# Patient Record
Sex: Female | Born: 1935
Health system: Southern US, Community
[De-identification: ages and names within clinical notes are randomized; demographics above are authoritative.]

## PROBLEM LIST (undated history)

## (undated) DIAGNOSIS — J45909 Unspecified asthma, uncomplicated: Secondary | ICD-10-CM

## (undated) DIAGNOSIS — T7840XA Allergy, unspecified, initial encounter: Secondary | ICD-10-CM

## (undated) DIAGNOSIS — E785 Hyperlipidemia, unspecified: Secondary | ICD-10-CM

## (undated) DIAGNOSIS — M810 Age-related osteoporosis without current pathological fracture: Secondary | ICD-10-CM

## (undated) HISTORY — DX: Allergy, unspecified, initial encounter: T78.40XA

## (undated) HISTORY — DX: Age-related osteoporosis without current pathological fracture: M81.0

## (undated) HISTORY — DX: Unspecified asthma, uncomplicated: J45.909

## (undated) HISTORY — PX: HAND TENDON SURGERY: SHX663

## (undated) HISTORY — DX: Hyperlipidemia, unspecified: E78.5

## (undated) HISTORY — PX: EYE SURGERY: SHX253

## (undated) HISTORY — PX: BREAST BIOPSY: SHX20

## (undated) HISTORY — PX: OTHER SURGICAL HISTORY: SHX169

---

## 1959-12-17 HISTORY — PX: TONSILLECTOMY: SHX5217

## 2000-03-18 ENCOUNTER — Encounter: Payer: Self-pay | Admitting: Emergency Medicine

## 2000-03-18 ENCOUNTER — Emergency Department (HOSPITAL_COMMUNITY): Admission: EM | Admit: 2000-03-18 | Discharge: 2000-03-18 | Payer: Self-pay | Admitting: Emergency Medicine

## 2000-04-04 ENCOUNTER — Ambulatory Visit (HOSPITAL_COMMUNITY): Admission: RE | Admit: 2000-04-04 | Discharge: 2000-04-04 | Payer: Self-pay | Admitting: Gastroenterology

## 2000-08-08 ENCOUNTER — Encounter (INDEPENDENT_AMBULATORY_CARE_PROVIDER_SITE_OTHER): Payer: Self-pay | Admitting: Specialist

## 2000-08-08 ENCOUNTER — Ambulatory Visit (HOSPITAL_COMMUNITY): Admission: RE | Admit: 2000-08-08 | Discharge: 2000-08-08 | Payer: Self-pay | Admitting: Urology

## 2001-12-16 HISTORY — PX: ABDOMINAL HYSTERECTOMY: SHX81

## 2006-06-02 ENCOUNTER — Ambulatory Visit: Payer: Self-pay | Admitting: Internal Medicine

## 2006-06-19 ENCOUNTER — Ambulatory Visit: Payer: Self-pay | Admitting: Specialist

## 2006-07-17 ENCOUNTER — Ambulatory Visit: Payer: Self-pay | Admitting: Specialist

## 2006-07-29 ENCOUNTER — Ambulatory Visit: Payer: Self-pay | Admitting: Specialist

## 2006-09-24 ENCOUNTER — Ambulatory Visit: Payer: Self-pay | Admitting: Specialist

## 2008-02-16 ENCOUNTER — Encounter: Admission: RE | Admit: 2008-02-16 | Discharge: 2008-02-16 | Payer: Self-pay | Admitting: Sports Medicine

## 2011-01-06 ENCOUNTER — Encounter: Payer: Self-pay | Admitting: Sports Medicine

## 2011-05-03 NOTE — Op Note (Signed)
[facility]  Patient:    Jackie Turner, Jackie Turner                       MRN: [id number] Proc. Date: 08/08/00 Adm. Date:  [id number] Attending:  Londell Moh CC:         Griffith Citron, M.D.  Lorie Phenix, M.D., [place], [place].   Operative Report  SERVICE:  Urology.  PREOPERATIVE DIAGNOSES:  Interstitial cystitis.  POSTOPERATIVE DIAGNOSES:  Interstitial cystitis.  PROCEDURE:  Cystoscopy, urethral calibration, hydrodistention of the bladder, Marcaine and Pyridium installation, Marcaine and Kenalog injection, bladder biopsy.  SURGEON:  Dr. Logan Bores.  ANESTHESIA:  General.  COMPLICATIONS:  None.  DRAINS:  None.  BRIEF HISTORY:  This 75 year old female has had a long standing problem with urinary urgency and frequency voiding as often as every 10-15 minutes. This problem started earlier this year. The patient has all the signs and symptoms of interstitial cystitis. She has had a trial of anticholinergic therapy without any real improvement and is now to undergo cystoscopy and hydrodistention as an attempt to determine the source of her problems. The patient understands the risks and benefits of the procedure and gave full and informed consent.  DESCRIPTION OF PROCEDURE:  After successful induction of general anesthesia, the patient was placed in the dorsal lithotomy position, prepped with Betadine and draped in the usual sterile fashion. The patient has previously been told that she has a cystocele; however, there is only a very modest cystocele that did not appear to be the source of the patients severe frequency. She is not known to have overflow incontinence, urgency incontinence or retention. Bimanual examination revealed no real abnormalities. There were no palpable masses, no significant rectocele or enterocele. There was no mass on the urethra. The urethra was calibrated at 50 [place] with female urethral sounds. The bladder was then  inspected and was free of any tumor or stones. Both ureteral orifices were normal in configuration and location. When the bladder was extended, the bladder had a bladder capacity of only 400 cc. She presented for 5 minutes at 100 cm of pressure. When the drain out cycle was done, the capacity as noted above was diminished to 400 cc. There was a terminal blood tinge and glomerulations were seen throughout the bladder. A bladder biopsy was taken and sent for a mast cell analysis. The biopsy site was cauterized. A mixture of Marcaine and Pyridium was left in the bladder. Marcaine and Kenalog were injected periurethrally. The patient tolerated the procedure well and was taken to the recovery room in good condition. She will be given a prescription for Lorcet plus to take as needed for pain, uromax for any symptom management and Macrobid for several days. She will return to my office in 2-3 weeks time for follow-up. We will determine if she has had any improvement in her symptoms and if not will initiate either intravesical therapy or oral therapy depending on the severity of her postoperative symptoms. DD:  08/08/00 TD:  08/10/00 Job: [id number]

## 2012-01-08 DIAGNOSIS — R350 Frequency of micturition: Secondary | ICD-10-CM | POA: Insufficient documentation

## 2012-01-08 DIAGNOSIS — R3915 Urgency of urination: Secondary | ICD-10-CM | POA: Insufficient documentation

## 2013-04-26 ENCOUNTER — Ambulatory Visit: Payer: Self-pay | Admitting: Ophthalmology

## 2013-04-27 DIAGNOSIS — M5416 Radiculopathy, lumbar region: Secondary | ICD-10-CM | POA: Insufficient documentation

## 2013-05-03 ENCOUNTER — Ambulatory Visit: Payer: Self-pay | Admitting: Ophthalmology

## 2013-05-17 ENCOUNTER — Ambulatory Visit: Payer: Self-pay | Admitting: Ophthalmology

## 2013-08-12 ENCOUNTER — Encounter: Payer: Self-pay | Admitting: Orthopedic Surgery

## 2013-08-16 ENCOUNTER — Encounter: Payer: Self-pay | Admitting: Orthopedic Surgery

## 2013-09-15 ENCOUNTER — Encounter: Payer: Self-pay | Admitting: Orthopedic Surgery

## 2014-01-27 LAB — CBC AND DIFFERENTIAL
HCT: 41 % (ref 36–46)
HEMOGLOBIN: 14.3 g/dL (ref 12.0–16.0)
PLATELETS: 338 10*3/uL (ref 150–399)
WBC: 5 10*3/mL

## 2014-01-27 LAB — BASIC METABOLIC PANEL
BUN: 15 mg/dL (ref 4–21)
Creatinine: 0.7 mg/dL (ref 0.5–1.1)
Glucose: 93 mg/dL
Potassium: 4.8 mmol/L (ref 3.4–5.3)
Sodium: 144 mmol/L (ref 137–147)

## 2014-01-27 LAB — LIPID PANEL
CHOLESTEROL: 213 mg/dL — AB (ref 0–200)
HDL: 71 mg/dL — AB (ref 35–70)
LDL CALC: 119 mg/dL
TRIGLYCERIDES: 115 mg/dL (ref 40–160)

## 2014-01-27 LAB — HEPATIC FUNCTION PANEL
ALT: 17 U/L (ref 7–35)
AST: 17 U/L (ref 13–35)

## 2014-01-27 LAB — TSH: TSH: 4 u[IU]/mL (ref 0.41–5.90)

## 2014-02-16 ENCOUNTER — Ambulatory Visit: Payer: Self-pay | Admitting: Family Medicine

## 2014-07-18 ENCOUNTER — Ambulatory Visit: Payer: Self-pay

## 2015-03-31 ENCOUNTER — Other Ambulatory Visit: Payer: Self-pay | Admitting: Family Medicine

## 2015-03-31 DIAGNOSIS — R69 Illness, unspecified: Secondary | ICD-10-CM

## 2015-03-31 DIAGNOSIS — Z1231 Encounter for screening mammogram for malignant neoplasm of breast: Secondary | ICD-10-CM

## 2015-04-07 NOTE — Op Note (Signed)
PATIENT NAME:  Jackie Turner, Jackie Turner MR#:  [id number] DATE OF BIRTH:  [birth year]  DATE OF SURGERY: 05/17/2013.   PREOPERATIVE DIAGNOSIS: Cataract, right eye.   POSTOPERATIVE DIAGNOSIS: Cataract, right eye.   PROCEDURE PERFORMED: Extracapsular cataract extraction using phacoemulsification with placement of an Alcon [id number] 11.5-diopter posterior chamber lens with 2.25 diopters of cylinder, serial #[id number].   SURGEON: Loura Back. [place]aundra Takacs, M.D.   ANESTHESIA: 4% lidocaine and 0.75% Marcaine in 50:50 mixture with 10 units/mL of Hylenex added given as a peribulbar.   ANESTHESIOLOGIST: Dr. Carolin Sicks.   COMPLICATIONS: None.   ESTIMATED BLOOD LOSS: Less than 1 mL.   DESCRIPTION OF PROCEDURE: The patient was brought to the operating room and had topical proparacaine placed in each eye. With the patient fixing at a distant target, the 3, 6, and  9 positions were marked using a marking pen. The horizontal positions were confirmed using an  [place] toric marker. The patient was then placed supine and given IV sedation and a peribulbar block. She was then prepped and draped in the usual fashion. The vertical rectus muscles were imbricated using 5-0 silk sutures as a bridle suture. The toric marker was brought to the table and centered over the 3 marks, and the 19-degree position was marked; also the 110-degree position was marked superiorly. A limbal peritomy was carried out for 1 clock-hour at 110 degrees. Hemostasis was obtained with cautery. A partial-thickness scleral groove was made at the posterior surgical limbus and dissected anteriorly into clear cornea with an Alcon crescent knife. The anterior chamber was entered superonasally through clear cornea and through the lamellar section with a 2.6 mm keratome. DisCoVisc was used to take the aqueous, and a continuous-tear circular capsulorrhexis was carried out. Hydrodelineation was used to loosen the nucleus. Phacoemulsification was carried out in a divide-and  conquer technique. Ultrasound time was  1 minute and 17 seconds, with an average power of 16.9%. CDE 23.06.   Irrigation-aspiration was used to remove the residual cortex. The graft capsular bag was inflated with DisCoVisc, and the intraocular lens was inserted in the capsular bag using a [employer]. It was being lined up with the marks. It was noted that there was a small laceration at the base of one of the haptics. This was inspected using a [place] hook and found to be not full-thickness throughout the extend laceration. It extended approximately halfway into the base. The haptic unfolded normally and the lens centered appropriately, so a decision was made that it was safe to leave the lens in position with this partial-thickness cut into the base of the haptic.   Irrigation-aspiration was used to remove the residual DisCoVisc. The position of the lens was reconfirmed to be lined up with the 19-degree marks. Balanced salt was used to inflate the wound, and Miostat was injected in the paracentesis tract. A tenth of a mL of cefuroxime was injected through the paracentesis tract as well. The wound was checked for leaks, and none were found. Conjunctiva was then closed with cautery. The bridle sutures were removed and 2 drops of Vigamox were placed in the eye. A shield was placed on the eye and the patient was discharged to the recovery room in good condition.    ____________________________ Loura Back Shaliah Wann, MD sad:dm D: 05/17/2013 13:49:12 ET T: 05/17/2013 15:41:42 ET JOB#: [id number]  cc: Remo Lipps A. Nahal Wanless, MD, <Dictator> Martie Lee MD ELECTRONICALLY SIGNED 05/24/2013 13:58

## 2015-04-07 NOTE — Op Note (Signed)
PATIENT NAME:  Jackie Turner, Jackie Turner MR#:  [id number] DATE OF BIRTH:  [birth year]  DATE OF PROCEDURE:  05/03/2013  PREOPERATIVE DIAGNOSIS: Cataract, left eye.   POSTOPERATIVE DIAGNOSIS: Cataract, left eye.   PROCEDURE PERFORMED: Extracapsular cataract extraction using phacoemulsification with placement of Alcon  [id number], 11.5 diopter posterior chamber lens, serial #[id number].   SURGEON: Loura Back. Rosemary Mossbarger, MD   ASSISTANT: None.  ANESTHESIA: 4% lidocaine and 0.75% Marcaine in a 50/50 mixture with 10 units/mL of Hylenex added, given as a peribulbar.   ANESTHESIOLOGIST: Amy M. Rice, MD  COMPLICATIONS: None.   ESTIMATED BLOOD LOSS: Less than 1 mL.   DESCRIPTION OF PROCEDURE:  The patient was brought to the operating room and given a peribulbar block.  The patient was then prepped and draped in the usual fashion.  The vertical rectus muscles were imbricated using 5-0 silk sutures.  These sutures were then clamped to the sterile drapes as bridle sutures.  A limbal peritomy was performed extending two clock hours and hemostasis was obtained with cautery.  A partial thickness scleral groove was made at the surgical limbus and dissected anteriorly in a lamellar dissection using an Alcon crescent knife.  The anterior chamber was entered supero-temporally with a Superblade and through the lamellar dissection with a 2.6 mm keratome.  DisCoVisc was used to replace the aqueous and a continuous tear capsulorrhexis was carried out.  Hydrodissection and hydrodelineation were carried out with balanced salt and a 27 gauge canula.  The nucleus was rotated to confirm the effectiveness of the hydrodissection.  Phacoemulsification was carried out using a divide-and-conquer technique.  Total ultrasound time was 1 minute and 21 seconds with an average power of  24.7 percent. CDE 35.88.  Irrigation/aspiration was used to remove the residual cortex.  DisCoVisc was used to inflate the capsule and the internal incision  was enlarged to 3 mm with the crescent knife.  The intraocular lens was folded and inserted into the capsular bag using the AcrySert delivery system.  Irrigation/aspiration was used to remove the residual DisCoVisc.  Miostat was injected into the anterior chamber through the paracentesis track to inflate the anterior chamber and induce miosis. Cefuroxime 0.1 mL was also injected through the paracentesis track. The wound was checked for leaks and none were found. The conjunctiva was closed with cautery and the bridle sutures were removed.  Two drops of 0.3% Vigamox were placed on the eye.   An eye shield was placed on the eye.  The patient was discharged to the recovery room in good condition.  ____________________________ Loura Back Jackie Winkowski, MD sad:jm D: 05/03/2013 14:11:36 ET T: 05/03/2013 16:44:32 ET JOB#: [id number]  cc: Remo Lipps A. Copelan Maultsby, MD, <Dictator> Martie Lee MD ELECTRONICALLY SIGNED 05/17/2013 13:56

## 2015-05-03 LAB — HM MAMMOGRAPHY

## 2015-05-09 ENCOUNTER — Ambulatory Visit
Admission: RE | Admit: 2015-05-09 | Discharge: 2015-05-09 | Disposition: A | Discharge status: Home / Self Care | Payer: Medicare Other | Source: Ambulatory Visit | Attending: Family Medicine | Admitting: Family Medicine

## 2015-05-09 DIAGNOSIS — M810 Age-related osteoporosis without current pathological fracture: Secondary | ICD-10-CM | POA: Insufficient documentation

## 2015-05-09 DIAGNOSIS — R69 Illness, unspecified: Secondary | ICD-10-CM

## 2015-05-09 LAB — HM DEXA SCAN

## 2015-06-26 DIAGNOSIS — E785 Hyperlipidemia, unspecified: Secondary | ICD-10-CM | POA: Insufficient documentation

## 2015-06-26 DIAGNOSIS — G43909 Migraine, unspecified, not intractable, without status migrainosus: Secondary | ICD-10-CM | POA: Insufficient documentation

## 2015-06-26 DIAGNOSIS — R5383 Other fatigue: Secondary | ICD-10-CM | POA: Insufficient documentation

## 2015-06-26 DIAGNOSIS — G43009 Migraine without aura, not intractable, without status migrainosus: Secondary | ICD-10-CM

## 2015-06-26 DIAGNOSIS — M810 Age-related osteoporosis without current pathological fracture: Secondary | ICD-10-CM | POA: Insufficient documentation

## 2015-06-26 DIAGNOSIS — J45909 Unspecified asthma, uncomplicated: Secondary | ICD-10-CM | POA: Insufficient documentation

## 2015-06-26 DIAGNOSIS — K589 Irritable bowel syndrome without diarrhea: Secondary | ICD-10-CM | POA: Insufficient documentation

## 2015-06-26 DIAGNOSIS — E7800 Pure hypercholesterolemia, unspecified: Secondary | ICD-10-CM

## 2015-06-27 ENCOUNTER — Encounter: Payer: Self-pay | Admitting: Family Medicine

## 2015-06-27 ENCOUNTER — Ambulatory Visit
Admission: RE | Admit: 2015-06-27 | Discharge: 2015-06-27 | Disposition: A | Discharge status: Home / Self Care | Payer: Medicare Other | Source: Ambulatory Visit | Attending: Family Medicine | Admitting: Family Medicine

## 2015-06-27 ENCOUNTER — Telehealth: Payer: Self-pay

## 2015-06-27 ENCOUNTER — Ambulatory Visit (INDEPENDENT_AMBULATORY_CARE_PROVIDER_SITE_OTHER): Payer: Medicare Other | Admitting: Family Medicine

## 2015-06-27 VITALS — BP 122/70 | HR 72 | Temp 99.6°F | Resp 16 | Wt 108.0 lb

## 2015-06-27 DIAGNOSIS — J984 Other disorders of lung: Secondary | ICD-10-CM | POA: Insufficient documentation

## 2015-06-27 DIAGNOSIS — R05 Cough: Secondary | ICD-10-CM

## 2015-06-27 DIAGNOSIS — J90 Pleural effusion, not elsewhere classified: Secondary | ICD-10-CM | POA: Diagnosis not present

## 2015-06-27 DIAGNOSIS — R509 Fever, unspecified: Secondary | ICD-10-CM | POA: Diagnosis not present

## 2015-06-27 DIAGNOSIS — R059 Cough, unspecified: Secondary | ICD-10-CM

## 2015-06-27 MED ORDER — PREDNISONE 10 MG PO TABS: ORAL_TABLET | ORAL | Status: DC

## 2015-06-27 MED ORDER — LEVOFLOXACIN 500 MG PO TABS: 500.0000 mg | ORAL_TABLET | Freq: Every day | ORAL | Status: DC

## 2015-06-27 NOTE — Telephone Encounter (Signed)
Pt advised as directed below.  I also advised her she would need a repeat x-ray in about four weeks to make sure the pneumonia clears.    Thanks,   -Mickel Baas

## 2015-06-27 NOTE — Progress Notes (Signed)
Subjective:     Patient ID: Jackie Turner, female   DOB: [birth year], 79 y.o.   MRN: [id number]  URI  This is a new problem. The current episode started in the past 7 days. The problem has been unchanged. The maximum temperature recorded prior to her arrival was 100.4 - 100.9 F. The fever has been present for 3 to 4 days. Associated symptoms include congestion, coughing, headaches, a plugged ear sensation and wheezing. Pertinent negatives include no abdominal pain, chest pain, diarrhea, ear pain, nausea, rhinorrhea, sinus pain, sneezing, sore throat or vomiting. She has tried decongestant and antihistamine for the symptoms. The treatment provided no relief.  Cough This is a new problem. The current episode started in the past 7 days. The problem has been unchanged. The problem occurs constantly. The cough is productive of sputum. Associated symptoms include chills, a fever, headaches and wheezing. Pertinent negatives include no chest pain, ear pain, heartburn, hemoptysis, myalgias, postnasal drip, rhinorrhea, sore throat, shortness of breath or weight loss. The treatment provided no relief. There is no history of asthma.  Fever  This is a new problem. The current episode started in the past 7 days. The problem occurs daily. The problem has been unchanged. The maximum temperature noted was 100 to 100.9 F. Associated symptoms include congestion, coughing, headaches and wheezing. Pertinent negatives include no abdominal pain, chest pain, diarrhea, ear pain, nausea, sore throat or vomiting.   Was recently with her husband at [facility]. He is not sick currently.    Review of Systems  Constitutional: Positive for fever, chills, diaphoresis, appetite change and fatigue. Negative for weight loss, activity change and unexpected weight change.  HENT: Positive for congestion. Negative for ear discharge, ear pain, facial swelling, hearing loss, mouth sores, nosebleeds, postnasal drip, rhinorrhea, sinus pressure,  sneezing, sore throat, tinnitus, trouble swallowing and voice change.   Respiratory: Positive for cough and wheezing. Negative for apnea, hemoptysis, choking, shortness of breath and stridor.   Cardiovascular: Negative for chest pain.  Gastrointestinal: Positive for constipation. Negative for heartburn, nausea, vomiting, abdominal pain, diarrhea, blood in stool, abdominal distention, anal bleeding and rectal pain.  Musculoskeletal: Negative for myalgias, back pain and arthralgias.  Neurological: Positive for light-headedness and headaches. Negative for dizziness, tremors, seizures, syncope, speech difficulty, weakness and numbness.   Patient Active Problem List   Diagnosis Date Noted  . Hypercholesteremia 06/26/2015  . Migraine 06/26/2015  . Asthma 06/26/2015  . IBS (irritable bowel syndrome) 06/26/2015  . Osteoporosis 06/26/2015  . Fatigue 06/26/2015  . Lumbar radiculopathy 04/27/2013   Past Medical History  Diagnosis Date  . Hyperlipidemia   . Allergy   . Asthma   . Osteoporosis    Current Outpatient Prescriptions on File Prior to Visit  Medication Sig  . Calcium Carbonate-Vitamin D 600-400 MG-UNIT per tablet Take by mouth.  . doxepin (SINEQUAN) 50 MG capsule Take 1 capsule by mouth daily.  . fluticasone (FLONASE) 50 MCG/ACT nasal spray   . Fluticasone-Salmeterol (ADVAIR DISKUS) 100-50 MCG/DOSE AEPB   . loratadine (CLARITIN) 10 MG tablet Take by mouth.  . oxybutynin (DITROPAN XL) 15 MG 24 hr tablet   . pentosan polysulfate (ELMIRON) 100 MG capsule   . traMADol (ULTRAM) 50 MG tablet Take 1-2 tablets by mouth as needed.  . verapamil (CALAN-SR) 180 MG CR tablet    No current facility-administered medications on file prior to visit.   Allergies  Allergen Reactions  . Nsaids Other (See Comments)    History of gastric ulcer  .  Amoxicillin Nausea Only  . Ibandronic Acid Other (See Comments)    Muscle weakness - advised not to take  . Butalbital-Aspirin-Caffeine     Other  reaction(s): Unknown  . Clarithromycin Other (See Comments)    Bloating  . Tylenol With Codeine #3  [Acetaminophen-Codeine]     Other reaction(s): Unknown  . Raloxifene Other (See Comments)    bloating   Past Surgical History  Procedure Laterality Date  . Abdominal hysterectomy  2003  . Tonsillectomy  1961  . Eye surgery Bilateral     cataract extraction  . Breast biopsy  1976, 1978    benign  . Carpal tunnel repair Bilateral     R 1990; L 1997   History   Social History  . Marital Status: Married    Spouse Name: [name]  . Number of Children: 0  . Years of Education: H/S   Occupational History  . Retired    Social History Main Topics  . Smoking status: Never Smoker   . Smokeless tobacco: Never Used  . Alcohol Use: No  . Drug Use: No  . Sexual Activity: Not on file   Other Topics Concern  . Not on file   Social History Narrative   Family History  Problem Relation Age of Onset  . Heart disease Mother   . CAD Mother   . Congestive Heart Failure Mother   . Osteoarthritis Mother   . Cancer Sister     breast       Objective:   Physical Exam  Constitutional: She is oriented to person, place, and time. She appears well-developed and well-nourished.  HENT:  Head: Normocephalic and atraumatic.  Right Ear: External ear normal.  Left Ear: External ear normal.  Nose: Nose normal.  Mouth/Throat: Oropharynx is clear and moist.  Neck: Normal range of motion. Neck supple.  Cardiovascular: Normal rate and regular rhythm.   Pulmonary/Chest: Effort normal. She has wheezes (especially in left lung fields.  Not much improvement with nebs.  ).  Neurological: She is alert and oriented to person, place, and time.  Skin: Skin is warm.  Psychiatric: She has a normal mood and affect. Her behavior is normal. Judgment and thought content normal.   BP 122/70 mmHg  Pulse 72  Temp(Src) 99.6 F (37.6 C) (Oral)  Resp 16  Wt 108 lb (48.988 kg)  SpO2 90%     Assessment:      Cough - Plan: DG Chest 2 View  Fever, unspecified fever cause - Plan: DG Chest 2 View      Plan:       Patient with signs and symptoms of bronchitis versus pneumonia.  Does have asthma and has been using her inhalers. Will add Prednisone 12 day taper and Levaquin 500 mg for 7 days. Please call back if condition worsens or does not continue to improve.   Will go to ER if any respiratory distress.    Margarita Rana, MD

## 2015-06-27 NOTE — Telephone Encounter (Signed)
-----   Message from Margarita Rana, MD sent at 06/27/2015  3:03 PM EDT ----- May have  Pneumonia as suspected. Proceed with medication as started today. Thanks.

## 2015-06-29 ENCOUNTER — Telehealth: Payer: Self-pay | Admitting: Family Medicine

## 2015-06-29 NOTE — Telephone Encounter (Signed)
Pt states her mouth is red and raw since she started the Rx predniSONE (DELTASONE) 10 MG tablet and levofloxacin (LEVAQUIN) 500 MG.  Pt is requesting a Rx for 120 Nystatin 100000 u/ml sus ml liquid or pill to help with this.  Pt states she got this from the pharmacist.  [place].  CB#[phone]

## 2015-06-30 MED ORDER — NYSTATIN 100000 UNIT/ML MT SUSP
5.0000 mL | Freq: Four times a day (QID) | OROMUCOSAL | Status: DC
Start: 2015-06-30 — End: 2015-09-27

## 2015-06-30 NOTE — Telephone Encounter (Signed)
Informed pt as below. Jackie Turner, CMA  

## 2015-06-30 NOTE — Telephone Encounter (Signed)
Sent rx. Thanks.  

## 2015-07-18 ENCOUNTER — Telehealth: Payer: Self-pay | Admitting: Family Medicine

## 2015-07-18 MED ORDER — DICYCLOMINE HCL 10 MG PO CAPS: 10.0000 mg | ORAL_CAPSULE | Freq: Three times a day (TID) | ORAL | Status: DC

## 2015-07-18 NOTE — Telephone Encounter (Signed)
Pt contacted office for refill request on the following medications:   Bentyl 10mg .  [facility].  CB#[phone]/MW

## 2015-07-18 NOTE — Telephone Encounter (Signed)
Last ov was on 06/27/2015 Last time prescription was refilled was on 12/01/2014

## 2015-08-02 ENCOUNTER — Other Ambulatory Visit: Payer: Self-pay | Admitting: Family Medicine

## 2015-08-02 NOTE — Telephone Encounter (Signed)
This is a pt of Dr. Sharyon Medicus Last ov was on 06/27/2015  Thanks,

## 2015-08-08 ENCOUNTER — Other Ambulatory Visit: Payer: Self-pay | Admitting: Family Medicine

## 2015-08-08 DIAGNOSIS — K589 Irritable bowel syndrome without diarrhea: Secondary | ICD-10-CM

## 2015-08-29 ENCOUNTER — Other Ambulatory Visit: Payer: Self-pay | Admitting: Otolaryngology

## 2015-08-29 ENCOUNTER — Ambulatory Visit
Admission: RE | Admit: 2015-08-29 | Discharge: 2015-08-29 | Disposition: A | Discharge status: Home / Self Care | Payer: Medicare Other | Source: Ambulatory Visit | Attending: Otolaryngology | Admitting: Otolaryngology

## 2015-08-29 ENCOUNTER — Telehealth: Payer: Self-pay

## 2015-08-29 ENCOUNTER — Telehealth: Payer: Self-pay | Admitting: Family Medicine

## 2015-08-29 DIAGNOSIS — J45901 Unspecified asthma with (acute) exacerbation: Secondary | ICD-10-CM | POA: Diagnosis present

## 2015-08-29 DIAGNOSIS — R0602 Shortness of breath: Secondary | ICD-10-CM

## 2015-08-29 DIAGNOSIS — J45909 Unspecified asthma, uncomplicated: Secondary | ICD-10-CM

## 2015-08-29 DIAGNOSIS — R7981 Abnormal blood-gas level: Secondary | ICD-10-CM

## 2015-08-29 NOTE — Telephone Encounter (Signed)
Dr. Kathyrn Sheriff called requesting pt FU with PCP. Pt currently has O2 sats in the 80's. Dr. Ayesha Mohair has treatment plan, and advised pt to report to ED if sx worsen. Dr. Kathyrn Sheriff advised pt of appt time. Renaldo Fiddler, CMA

## 2015-08-29 NOTE — Telephone Encounter (Signed)
Please me this patient.  I was blocked out at 4:30. Can move her to 3:30. Thanks.

## 2015-08-29 NOTE — Telephone Encounter (Signed)
Pt advised; moved to 3:30  Thanks,   -Mickel Baas

## 2015-08-31 ENCOUNTER — Encounter: Payer: Self-pay | Admitting: Family Medicine

## 2015-08-31 ENCOUNTER — Ambulatory Visit: Payer: Self-pay | Admitting: Family Medicine

## 2015-08-31 ENCOUNTER — Ambulatory Visit (INDEPENDENT_AMBULATORY_CARE_PROVIDER_SITE_OTHER): Payer: Medicare Other | Admitting: Family Medicine

## 2015-08-31 DIAGNOSIS — J4530 Mild persistent asthma, uncomplicated: Secondary | ICD-10-CM | POA: Diagnosis not present

## 2015-08-31 DIAGNOSIS — M549 Dorsalgia, unspecified: Secondary | ICD-10-CM | POA: Insufficient documentation

## 2015-08-31 DIAGNOSIS — F4320 Adjustment disorder, unspecified: Secondary | ICD-10-CM | POA: Insufficient documentation

## 2015-08-31 DIAGNOSIS — J45909 Unspecified asthma, uncomplicated: Secondary | ICD-10-CM | POA: Insufficient documentation

## 2015-08-31 DIAGNOSIS — N3010 Interstitial cystitis (chronic) without hematuria: Secondary | ICD-10-CM | POA: Insufficient documentation

## 2015-08-31 DIAGNOSIS — F4321 Adjustment disorder with depressed mood: Secondary | ICD-10-CM | POA: Insufficient documentation

## 2015-08-31 MED ORDER — BUSPIRONE HCL 10 MG PO TABS: 10.0000 mg | ORAL_TABLET | Freq: Two times a day (BID) | ORAL | Status: DC

## 2015-08-31 NOTE — Progress Notes (Signed)
Subjective:    Patient ID: Jackie Turner, female    DOB: Mar 11, 1936, 79 y.o.   MRN: [id number]  Breathing Problem She complains of difficulty breathing and shortness of breath. There is no cough or wheezing (Pt reports Wheezing Tuesday; but not now. ). The problem occurs constantly. The problem has been gradually improving. Associated symptoms include malaise/fatigue. Pertinent negatives include no appetite change, chest pain, dyspnea on exertion, ear congestion, ear pain, fever, headaches, heartburn, myalgias, nasal congestion, orthopnea, PND, postnasal drip, rhinorrhea, sneezing, sore throat, sweats, trouble swallowing or weight loss. Her symptoms are alleviated by steroid inhaler and oral steroids. She reports moderate improvement on treatment. Her past medical history is significant for asthma.   Also having a lot of trouble with grief over her husband's death. Has good days and bad days. Really not doing as much as she would like. Is sleeping ok. Cries a lot.  Thinks her stress is making her breathing worse. Is having a lot of headaches also.  Taking Ultram quite frequently.   Patient Active Problem List   Diagnosis Date Noted  . Asthma with allergic rhinitis 08/31/2015  . Back ache 08/31/2015  . Chronic interstitial cystitis 08/31/2015  . Hypercholesteremia 06/26/2015  . Migraine 06/26/2015  . Asthma 06/26/2015  . IBS (irritable bowel syndrome) 06/26/2015  . Osteoporosis 06/26/2015  . Fatigue 06/26/2015  . Lumbar radiculopathy 04/27/2013   Family History  Problem Relation Age of Onset  . Heart disease Mother   . CAD Mother   . Congestive Heart Failure Mother   . Osteoarthritis Mother   . Cancer Sister     breast  . Breast cancer Sister    Social History   Social History  . Marital Status: Widowed    Spouse Name: [name]  . Number of Children: 0  . Years of Education: H/S   Occupational History  . Retired    Social History Main Topics  . Smoking status: Never  Smoker   . Smokeless tobacco: Never Used  . Alcohol Use: No  . Drug Use: No  . Sexual Activity: Not on file   Other Topics Concern  . Not on file   Social History Narrative   Past Surgical History  Procedure Laterality Date  . Abdominal hysterectomy  2003  . Tonsillectomy  1961  . Eye surgery Bilateral     cataract extraction  . Breast biopsy  1976, 1978    benign  . Carpal tunnel repair Bilateral     R 1990; L 1997  . Hand tendon surgery Right     Trigger finger   Allergies  Allergen Reactions  . Nsaids Other (See Comments)    History of gastric ulcer  . Amoxicillin Nausea Only  . Ibandronic Acid Other (See Comments)    Muscle weakness - advised not to take  . Antihistamines, Chlorpheniramine-Type   . Aspirin     Gastric ulcer  . Actonel  [Risedronate] Other (See Comments)    Gastric ulcers  . Butalbital-Aspirin-Caffeine Other (See Comments)    Other reaction(s): Unknown  . Clarithromycin Other (See Comments)    Bloating Bloating Bloating  . Codeine   . Gatifloxacin Other (See Comments)  . Moxifloxacin   . Penicillins Other (See Comments)  . Risedronate Sodium     Gastric ulcer  . Tylenol With Codeine #3  [Acetaminophen-Codeine]     Other reaction(s): Unknown  . Raloxifene Other (See Comments)    Bloating bloating   Previous Medications   CALCIUM  CARBONATE-VITAMIN D 600-400 MG-UNIT PER TABLET    Take by mouth.   DICYCLOMINE (BENTYL) 10 MG CAPSULE    Take 1 capsule (10 mg total) by mouth 4 (four) times daily -  before meals and at bedtime.   DOXEPIN (SINEQUAN) 10 MG CAPSULE    TAKE ONE (1) CAPSULE EACH DAY   DOXEPIN (SINEQUAN) 50 MG CAPSULE    TAKE ONE (1) CAPSULE EACH DAY   FLUTICASONE (FLONASE) 50 MCG/ACT NASAL SPRAY       FLUTICASONE-SALMETEROL (ADVAIR DISKUS) 100-50 MCG/DOSE AEPB       LEVOFLOXACIN (LEVAQUIN) 500 MG TABLET    Take 1 tablet (500 mg total) by mouth daily.   LORATADINE (CLARITIN) 10 MG TABLET    Take by mouth.   METHYLPREDNISOLONE  (MEDROL DOSEPAK) 4 MG TBPK TABLET       NYSTATIN (MYCOSTATIN) 100000 UNIT/ML SUSPENSION    Take 5 mLs (500,000 Units total) by mouth 4 (four) times daily.   OXYBUTYNIN (DITROPAN XL) 15 MG 24 HR TABLET       PENTOSAN POLYSULFATE (ELMIRON) 100 MG CAPSULE       PREDNISONE (DELTASONE) 10 MG TABLET    6 po for 2 days and then 5 po for 2 days and then 4 po for 2 days and 3 po for 2 days and then 2 po for 2 days and then 1 po for 2 days.   TRAMADOL (ULTRAM) 50 MG TABLET    Take 1-2 tablets by mouth as needed.   VENTOLIN HFA 108 (90 BASE) MCG/ACT INHALER       VERAPAMIL (CALAN-SR) 180 MG CR TABLET       BP 136/70 mmHg  Pulse 68  Temp(Src) 98.5 F (36.9 C) (Oral)  Resp 16  Wt 111 lb (50.349 kg)  SpO2 93%    Review of Systems  Constitutional: Positive for malaise/fatigue and fatigue. Negative for fever, chills, weight loss, diaphoresis, activity change, appetite change and unexpected weight change.  HENT: Negative for congestion, dental problem, drooling, ear discharge, ear pain, facial swelling, hearing loss, mouth sores, nosebleeds, postnasal drip, rhinorrhea, sinus pressure, sneezing, sore throat, tinnitus, trouble swallowing and voice change.   Eyes: Negative for photophobia, pain, discharge, redness, itching and visual disturbance.  Respiratory: Positive for chest tightness and shortness of breath. Negative for apnea, cough, choking, wheezing (Pt reports Wheezing Tuesday; but not now. ) and stridor.   Cardiovascular: Negative for chest pain, dyspnea on exertion, palpitations, leg swelling and PND.  Gastrointestinal: Negative for heartburn, nausea, vomiting, abdominal pain, diarrhea, constipation, blood in stool, abdominal distention, anal bleeding and rectal pain.  Musculoskeletal: Negative for myalgias.  Neurological: Negative for dizziness, light-headedness and headaches.       Objective:   Physical Exam  Constitutional: She is oriented to person, place, and time. She appears  well-developed and well-nourished.  Cardiovascular: Normal rate and regular rhythm.   Pulmonary/Chest: Effort normal. She has wheezes.  Neurological: She is alert and oriented to person, place, and time.  Skin: Skin is warm and dry.  Psychiatric: She has a normal mood and affect. Her behavior is normal. Judgment and thought content normal.  Tearful throughout ov.      BP 136/70 mmHg  Pulse 68  Temp(Src) 98.5 F (36.9 C) (Oral)  Resp 16  Wt 111 lb (50.349 kg)  SpO2 93%     Assessment & Plan:  1. Asthma with allergic rhinitis, mild persistent, uncomplicated Improved. Continue to monitor. Patient instructed to call back if condition worsens or  does not improve.     2. Grief reaction New problem. Condition is worsening. Will start medication for better control.  Patient instructed to call back if condition worsens or does not improve.    - busPIRone (BUSPAR) 10 MG tablet; Take 1 tablet (10 mg total) by mouth 2 (two) times daily.  Dispense: 60 tablet; Refill: 3  Patient was seen and examined by Jerrell Belfast, MD, and note scribed, in part,  by Ashley Royalty, CMA and reviewed by me with patient. I have reviewed the document for accuracy and completeness and I agree with above. Jerrell Belfast, MD   Margarita Rana, MD

## 2015-09-05 ENCOUNTER — Other Ambulatory Visit: Payer: Self-pay | Admitting: Family Medicine

## 2015-09-05 DIAGNOSIS — M549 Dorsalgia, unspecified: Secondary | ICD-10-CM

## 2015-09-05 NOTE — Telephone Encounter (Signed)
Ok to call in rx.  Thanks.  

## 2015-09-14 ENCOUNTER — Encounter: Payer: Self-pay | Admitting: Family Medicine

## 2015-09-14 ENCOUNTER — Ambulatory Visit (INDEPENDENT_AMBULATORY_CARE_PROVIDER_SITE_OTHER): Payer: Medicare Other | Admitting: Family Medicine

## 2015-09-14 VITALS — BP 122/72 | HR 84 | Temp 98.8°F | Resp 16 | Wt 111.0 lb

## 2015-09-14 DIAGNOSIS — J45901 Unspecified asthma with (acute) exacerbation: Secondary | ICD-10-CM

## 2015-09-14 DIAGNOSIS — J018 Other acute sinusitis: Secondary | ICD-10-CM | POA: Diagnosis not present

## 2015-09-14 DIAGNOSIS — J019 Acute sinusitis, unspecified: Secondary | ICD-10-CM | POA: Insufficient documentation

## 2015-09-14 MED ORDER — LEVOFLOXACIN 500 MG PO TABS: 500.0000 mg | ORAL_TABLET | Freq: Every day | ORAL | Status: DC

## 2015-09-14 MED ORDER — PREDNISONE 10 MG PO TABS: ORAL_TABLET | ORAL | Status: DC

## 2015-09-14 NOTE — Progress Notes (Signed)
Subjective:    Patient ID: Jackie Turner, female    DOB: 03-01-1936, 79 y.o.   MRN: [id number]  Depression        The current episode started 1 to 4 weeks ago.   The onset quality is sudden (pt was seen for Grief Reaction at OV 2 weeks ago).   The problem occurs daily.The problem is unchanged (Pt has NOT started Buspar; reports she is waiting until things calm down in case she has a reaction to the medication.).  Associated symptoms include fatigue and headaches.  Associated symptoms include no decreased concentration, no helplessness, no hopelessness, does not have insomnia, not irritable, no restlessness, no decreased interest, no appetite change, no body aches, no myalgias, no indigestion, not sad and no suicidal ideas.     The symptoms are aggravated by family issues.  Compliance with treatment is poor.  Past compliance problems include difficulty with treatment plan.  Also complaining of a headache. Did see ENT for it. Does need inhaler and it does help. Using it several times a day.  Is SOB. Worsening. Does have increased work of breathing also.     Review of Systems  Constitutional: Positive for fatigue. Negative for appetite change.  Respiratory: Positive for cough and shortness of breath.   Musculoskeletal: Negative for myalgias.  Neurological: Positive for headaches.  Psychiatric/Behavioral: Positive for depression. Negative for suicidal ideas and decreased concentration. The patient does not have insomnia.    BP 122/72 mmHg  Pulse 84  Temp(Src) 98.8 F (37.1 C) (Oral)  Resp 16  Wt 111 lb (50.349 kg)   Patient Active Problem List   Diagnosis Date Noted  . Asthma with allergic rhinitis 08/31/2015  . Back ache 08/31/2015  . Chronic interstitial cystitis 08/31/2015  . Grief reaction 08/31/2015  . Hypercholesteremia 06/26/2015  . Migraine 06/26/2015  . Asthma 06/26/2015  . IBS (irritable bowel syndrome) 06/26/2015  . Osteoporosis 06/26/2015  . Fatigue 06/26/2015  .  Lumbar radiculopathy 04/27/2013   Past Medical History  Diagnosis Date  . Hyperlipidemia   . Allergy   . Asthma   . Osteoporosis    Current Outpatient Prescriptions on File Prior to Visit  Medication Sig  . Calcium Carbonate-Vitamin D 600-400 MG-UNIT per tablet Take by mouth.  . dicyclomine (BENTYL) 10 MG capsule Take 1 capsule (10 mg total) by mouth 4 (four) times daily -  before meals and at bedtime.  Marland Kitchen doxepin (SINEQUAN) 10 MG capsule TAKE ONE (1) CAPSULE EACH DAY  . doxepin (SINEQUAN) 50 MG capsule TAKE ONE (1) CAPSULE EACH DAY  . fluticasone (FLONASE) 50 MCG/ACT nasal spray   . Fluticasone-Salmeterol (ADVAIR DISKUS) 100-50 MCG/DOSE AEPB   . levofloxacin (LEVAQUIN) 500 MG tablet Take 1 tablet (500 mg total) by mouth daily.  Marland Kitchen loratadine (CLARITIN) 10 MG tablet Take by mouth.  . nystatin (MYCOSTATIN) 100000 UNIT/ML suspension Take 5 mLs (500,000 Units total) by mouth 4 (four) times daily.  Marland Kitchen oxybutynin (DITROPAN XL) 15 MG 24 hr tablet   . pentosan polysulfate (ELMIRON) 100 MG capsule   . ULTRAM 50 MG tablet TAKE ONE TO TWO TABLETS AS NEEDED  . VENTOLIN HFA 108 (90 BASE) MCG/ACT inhaler   . verapamil (CALAN-SR) 180 MG CR tablet   . busPIRone (BUSPAR) 10 MG tablet Take 1 tablet (10 mg total) by mouth 2 (two) times daily. (Patient not taking: Reported on 09/14/2015)   No current facility-administered medications on file prior to visit.   Allergies  Allergen Reactions  .  Nsaids Other (See Comments)    History of gastric ulcer  . Amoxicillin Nausea Only  . Ibandronic Acid Other (See Comments)    Muscle weakness - advised not to take  . Antihistamines, Chlorpheniramine-Type   . Aspirin     Gastric ulcer  . Actonel  [Risedronate] Other (See Comments)    Gastric ulcers  . Butalbital-Aspirin-Caffeine Other (See Comments)    Other reaction(s): Unknown  . Clarithromycin Other (See Comments)    Bloating Bloating Bloating  . Codeine   . Gatifloxacin Other (See Comments)  .  Moxifloxacin   . Penicillins Other (See Comments)  . Risedronate Sodium     Gastric ulcer  . Tylenol With Codeine #3  [Acetaminophen-Codeine]     Other reaction(s): Unknown  . Raloxifene Other (See Comments)    Bloating bloating   Past Surgical History  Procedure Laterality Date  . Abdominal hysterectomy  2003  . Tonsillectomy  1961  . Eye surgery Bilateral     cataract extraction  . Breast biopsy  1976, 1978    benign  . Carpal tunnel repair Bilateral     R 1990; L 1997  . Hand tendon surgery Right     Trigger finger   Social History   Social History  . Marital Status: Widowed    Spouse Name: [name]  . Number of Children: 0  . Years of Education: H/S   Occupational History  . Retired    Social History Main Topics  . Smoking status: Never Smoker   . Smokeless tobacco: Never Used  . Alcohol Use: No  . Drug Use: No  . Sexual Activity: Not on file   Other Topics Concern  . Not on file   Social History Narrative   Family History  Problem Relation Age of Onset  . Heart disease Mother   . CAD Mother   . Congestive Heart Failure Mother   . Osteoarthritis Mother   . Cancer Sister     breast  . Breast cancer Sister       Objective:   Physical Exam  Constitutional: She is oriented to person, place, and time. She appears well-developed and well-nourished. She is not irritable.  Cardiovascular: Normal rate and regular rhythm.   Pulmonary/Chest: Accessory muscle usage present. She has wheezes.  Neurological: She is alert and oriented to person, place, and time.  Psychiatric: She has a normal mood and affect. Her behavior is normal. Thought content normal.  BP 122/72 mmHg  Pulse 84  Temp(Src) 98.8 F (37.1 C) (Oral)  Resp 16  Wt 111 lb (50.349 kg)     Assessment & Plan:  1. Asthma exacerbation Worsening. Will start medication and call if worsens or does not improve.   - predniSONE (DELTASONE) 10 MG tablet; 6 po for 1 day and then 5 po for 1 day and then 4  po for 1 day and 3 po for 1 day and then 2 po for 1 day and then 1 po for 1 day.  Dispense: 21 tablet; Refill: 0  2. Other acute sinusitis New problem. Condition is worsening. Will start medication for better control.  Patient instructed to call back if condition worsens or does not improve.    - levofloxacin (LEVAQUIN) 500 MG tablet; Take 1 tablet (500 mg total) by mouth daily.  Dispense: 7 tablet; Refill: 0  Margarita Rana, MD

## 2015-09-27 ENCOUNTER — Ambulatory Visit
Admission: RE | Admit: 2015-09-27 | Discharge: 2015-09-27 | Disposition: A | Discharge status: Home / Self Care | Payer: Medicare Other | Source: Ambulatory Visit | Attending: Family Medicine | Admitting: Family Medicine

## 2015-09-27 ENCOUNTER — Ambulatory Visit (INDEPENDENT_AMBULATORY_CARE_PROVIDER_SITE_OTHER): Payer: Medicare Other | Admitting: Family Medicine

## 2015-09-27 ENCOUNTER — Encounter: Payer: Self-pay | Admitting: Family Medicine

## 2015-09-27 VITALS — BP 100/60 | HR 76 | Temp 98.4°F | Resp 16 | Wt 110.0 lb

## 2015-09-27 DIAGNOSIS — J45909 Unspecified asthma, uncomplicated: Secondary | ICD-10-CM | POA: Diagnosis not present

## 2015-09-27 DIAGNOSIS — R0902 Hypoxemia: Secondary | ICD-10-CM

## 2015-09-27 DIAGNOSIS — R938 Abnormal findings on diagnostic imaging of other specified body structures: Secondary | ICD-10-CM | POA: Insufficient documentation

## 2015-09-27 DIAGNOSIS — Z23 Encounter for immunization: Secondary | ICD-10-CM

## 2015-09-27 DIAGNOSIS — R9389 Abnormal findings on diagnostic imaging of other specified body structures: Secondary | ICD-10-CM

## 2015-09-27 DIAGNOSIS — F4321 Adjustment disorder with depressed mood: Secondary | ICD-10-CM | POA: Diagnosis not present

## 2015-09-27 MED ORDER — FLUTICASONE-SALMETEROL 100-50 MCG/DOSE IN AEPB: 1.0000 | INHALATION_SPRAY | Freq: Two times a day (BID) | RESPIRATORY_TRACT | Status: DC

## 2015-09-27 NOTE — Progress Notes (Signed)
Subjective:    Patient ID: Jackie Turner, female    DOB: 1936-05-10, 79 y.o.   MRN: [id number]  Depression        The current episode started more than 1 month ago.   The onset quality is sudden (after husband's death). The problem is unchanged.  Associated symptoms include fatigue and headaches.  Associated symptoms include no decreased concentration, no helplessness, no hopelessness, does not have insomnia, not irritable, no restlessness, no decreased interest, no appetite change, no myalgias, no indigestion, not sad and no suicidal ideas.     The symptoms are aggravated by family issues.  Treatments tried: Buspar 10 mg.  Compliance with treatment is poor.  Past compliance problems include medication issues (Caused "dizziness". Does think she will retry it this weekend.  ).  Does get SOB with certain activities, but it does not last long. Did not feel any better or worse after prednisone.  Doesn't really feel bad.  Is using her Albuterol inhaler, but not on her Advair.   Not sure why she stopped it. Can not remember. Has had a lot of stress.     Review of Systems  Constitutional: Positive for fatigue. Negative for appetite change.  Musculoskeletal: Negative for myalgias.  Neurological: Positive for headaches.  Psychiatric/Behavioral: Positive for depression. Negative for suicidal ideas and decreased concentration. The patient does not have insomnia.    BP 100/60 mmHg  Pulse 76  Temp(Src) 98.4 F (36.9 C) (Oral)  Resp 16  Wt 110 lb (49.896 kg)  SpO2 88%   Patient Active Problem List   Diagnosis Date Noted  . Asthma exacerbation 09/14/2015  . Sinusitis, acute 09/14/2015  . Asthma with allergic rhinitis 08/31/2015  . Back ache 08/31/2015  . Chronic interstitial cystitis 08/31/2015  . Grief reaction 08/31/2015  . Hypercholesteremia 06/26/2015  . Migraine 06/26/2015  . Asthma 06/26/2015  . IBS (irritable bowel syndrome) 06/26/2015  . Osteoporosis 06/26/2015  . Fatigue  06/26/2015  . Lumbar radiculopathy 04/27/2013   Past Medical History  Diagnosis Date  . Hyperlipidemia   . Allergy   . Asthma   . Osteoporosis    Current Outpatient Prescriptions on File Prior to Visit  Medication Sig  . Calcium Carbonate-Vitamin D 600-400 MG-UNIT per tablet Take by mouth.  . dicyclomine (BENTYL) 10 MG capsule Take 1 capsule (10 mg total) by mouth 4 (four) times daily -  before meals and at bedtime.  Marland Kitchen doxepin (SINEQUAN) 10 MG capsule TAKE ONE (1) CAPSULE EACH DAY  . doxepin (SINEQUAN) 50 MG capsule TAKE ONE (1) CAPSULE EACH DAY  . fluticasone (FLONASE) 50 MCG/ACT nasal spray   . oxybutynin (DITROPAN XL) 15 MG 24 hr tablet   . pentosan polysulfate (ELMIRON) 100 MG capsule   . ULTRAM 50 MG tablet TAKE ONE TO TWO TABLETS AS NEEDED  . VENTOLIN HFA 108 (90 BASE) MCG/ACT inhaler   . verapamil (CALAN-SR) 180 MG CR tablet   . busPIRone (BUSPAR) 10 MG tablet Take 1 tablet (10 mg total) by mouth 2 (two) times daily. (Patient not taking: Reported on 09/14/2015)  . Fluticasone-Salmeterol (ADVAIR DISKUS) 100-50 MCG/DOSE AEPB   . loratadine (CLARITIN) 10 MG tablet Take by mouth.   No current facility-administered medications on file prior to visit.   Allergies  Allergen Reactions  . Nsaids Other (See Comments)    History of gastric ulcer  . Amoxicillin Nausea Only  . Ibandronic Acid Other (See Comments)    Muscle weakness - advised not to take  .  Antihistamines, Chlorpheniramine-Type   . Aspirin     Gastric ulcer  . Actonel  [Risedronate] Other (See Comments)    Gastric ulcers  . Butalbital-Aspirin-Caffeine Other (See Comments)    Other reaction(s): Unknown  . Clarithromycin Other (See Comments)    Bloating Bloating Bloating  . Codeine   . Gatifloxacin Other (See Comments)  . Moxifloxacin   . Penicillins Other (See Comments)  . Risedronate Sodium     Gastric ulcer  . Tylenol With Codeine #3  [Acetaminophen-Codeine]     Other reaction(s): Unknown  . Raloxifene  Other (See Comments)    Bloating bloating   Past Surgical History  Procedure Laterality Date  . Abdominal hysterectomy  2003  . Tonsillectomy  1961  . Eye surgery Bilateral     cataract extraction  . Breast biopsy  1976, 1978    benign  . Carpal tunnel repair Bilateral     R 1990; L 1997  . Hand tendon surgery Right     Trigger finger   Social History   Social History  . Marital Status: Widowed    Spouse Name: [name]  . Number of Children: 0  . Years of Education: H/S   Occupational History  . Retired    Social History Main Topics  . Smoking status: Never Smoker   . Smokeless tobacco: Never Used  . Alcohol Use: No  . Drug Use: No  . Sexual Activity: Not on file   Other Topics Concern  . Not on file   Social History Narrative   Family History  Problem Relation Age of Onset  . Heart disease Mother   . CAD Mother   . Congestive Heart Failure Mother   . Osteoarthritis Mother   . Cancer Sister     breast  . Breast cancer Sister      .result     Objective:   Physical Exam  Constitutional: She is oriented to person, place, and time. She appears well-developed and well-nourished. She is not irritable.  HENT:  Head: Normocephalic and atraumatic.  Eyes: Conjunctivae are normal. Pupils are equal, round, and reactive to light.  Neck: Normal range of motion. Neck supple.  Cardiovascular: Normal rate and regular rhythm.   Pulmonary/Chest: Effort normal and breath sounds normal.  Neurological: She is alert and oriented to person, place, and time.  Psychiatric: She has a normal mood and affect. Her behavior is normal. Judgment and thought content normal.   BP 100/60 mmHg  Pulse 76  Temp(Src) 98.4 F (36.9 C) (Oral)  Resp 16  Wt 110 lb (49.896 kg)  SpO2 88%     Assessment & Plan:  1. Asthma, unspecified asthma severity, uncomplicated Symptoms not at baseline. Still with pulmonary findings.   Did not feel better with Prednisone. Does not feel that bad.  Will  restart Advair and refer to pulmonary.  - Ambulatory referral to Pulmonology - Fluticasone-Salmeterol (ADVAIR DISKUS) 100-50 MCG/DOSE AEPB; Inhale 1 puff into the lungs 2 (two) times daily.  Dispense: 60 each; Refill: 5  2. Hypoxia New problem. Really does not feel bad, but will recheck CXR and refer to pulmonary.  - Ambulatory referral to Pulmonology - DG Chest 2 View; Future  3. Abnormal CXR As above.  - Ambulatory referral to Pulmonology - DG Chest 2 View; Future  4. Grief reaction Will try Buspar again.    5. Encounter for immunization Given today.   Margarita Rana, MD

## 2015-09-28 ENCOUNTER — Telehealth: Payer: Self-pay

## 2015-09-28 NOTE — Telephone Encounter (Signed)
Pt advised.   Thanks,   -Marlen Koman  

## 2015-09-28 NOTE — Telephone Encounter (Signed)
-----   Message from Margarita Rana, MD sent at 09/27/2015  4:52 PM EDT ----- Normal CXR. Thanks.

## 2015-10-02 ENCOUNTER — Other Ambulatory Visit: Payer: Self-pay | Admitting: Family Medicine

## 2015-10-02 DIAGNOSIS — F4321 Adjustment disorder with depressed mood: Secondary | ICD-10-CM

## 2015-10-31 ENCOUNTER — Encounter: Payer: Self-pay | Admitting: Internal Medicine

## 2015-10-31 ENCOUNTER — Ambulatory Visit (INDEPENDENT_AMBULATORY_CARE_PROVIDER_SITE_OTHER): Payer: Medicare Other | Admitting: Internal Medicine

## 2015-10-31 VITALS — BP 132/66 | HR 84 | Ht 61.0 in | Wt 112.2 lb

## 2015-10-31 DIAGNOSIS — J069 Acute upper respiratory infection, unspecified: Secondary | ICD-10-CM | POA: Diagnosis not present

## 2015-10-31 DIAGNOSIS — J4530 Mild persistent asthma, uncomplicated: Secondary | ICD-10-CM

## 2015-10-31 DIAGNOSIS — R0902 Hypoxemia: Secondary | ICD-10-CM

## 2015-10-31 MED ORDER — AMBULATORY NON FORMULARY MEDICATION: Status: DC

## 2015-10-31 NOTE — Assessment & Plan Note (Signed)
Chest xrays noted to have scarring, bilaterally, especially in the right base. Scarring poses an area for recurrent infection.  Given her hypoxia, recurrent cough and overall clinical status, a CT chest is warranted to evaluate for any other abnormalities.   Plan - CT Chest with contrast prior to follow up visit.

## 2015-10-31 NOTE — Assessment & Plan Note (Signed)
Patient with a history of asthma - currently on prn albuterol, advair 100/50, claritin, flonas  At time will cont with current meds.  Plan - cont with current meds - ICS 5-10 times per day - pfts and 62mwt prior to next visit - CT chest with contrast

## 2015-10-31 NOTE — Progress Notes (Signed)
[facility] Consultation    Date: 10/31/2015  MRN# [id number] Jackie Turner 03/25/1936  Referring Physician: Dr. Venia Minks PMD - Dr. Brantley Persons Jackie Turner is a 79 y.o. old female seen in consultation for dyspnea and recurrent cough.  CC:  Chief Complaint  Patient presents with  . PULMONARY CONSULT    pt. ref. by dr.maloney for asthma,hypoxia,abnormal cxr. pt. states occ. SOB. prod. cough clear in color x2d denies wheezing or chest pain/tightness.    HPI:  Patient is a pleasant 79 year old female seen in consultation today for worsening shortness of breath, hypoxia primary care office, recurrent upper spray tract infection, recurrent cough. patient states that in July 2016 she was diagnosed with pneumonia, she was placed on Levaquin and prednisone, she was fairly ill for about 2 weeks before she recovered.  Since then she's had sinusitis, seen evaluated by ENT;  Recurrent hypoxia her primary care office, and intermittent episodes of productive cough. Patient states about 2 days ago she started having a mild headache with mild ear pain on the left, this was followed by a cough with mild thick sputum. Patient states that her husband passed away in 07-19-15 from bladder cancer, and since then things have been difficult.  She does not wear any oxygen at home, but upon presentation today was noted to be saturating at 86%,  Which recovered with rest and 2 L of supplemental oxygen Patient is a never smoker, has not been exposed to secondhand smoking significantly. Patient has a dog at home, the dog is 18 years old.  overall patient states that she is doing fairly well, except for dyspnea on exertion since her recent pneumonia in July 2016  PMHX:   Past Medical History  Diagnosis Date  . Hyperlipidemia   . Allergy   . Asthma   . Osteoporosis    Surgical Hx:  Past Surgical History  Procedure Laterality Date  . Abdominal hysterectomy  2003  .  Tonsillectomy  1961  . Eye surgery Bilateral     cataract extraction  . Breast biopsy  1976, 1978    benign  . Carpal tunnel repair Bilateral     R 1990; L 1997  . Hand tendon surgery Right     Trigger finger   Family Hx:  Family History  Problem Relation Age of Onset  . Heart disease Mother   . CAD Mother   . Congestive Heart Failure Mother   . Osteoarthritis Mother   . Cancer Sister     breast  . Breast cancer Sister    Social Hx:   Social History  Substance Use Topics  . Smoking status: Never Smoker   . Smokeless tobacco: Never Used  . Alcohol Use: No   Medication:   Current Outpatient Rx  Name  Route  Sig  Dispense  Refill  . busPIRone (BUSPAR) 10 MG tablet   Oral   Take 1 tablet (10 mg total) by mouth 2 (two) times daily.   60 tablet   3   . Calcium Carbonate-Vitamin D 600-400 MG-UNIT per tablet   Oral   Take by mouth.         . dicyclomine (BENTYL) 10 MG capsule   Oral   Take 1 capsule (10 mg total) by mouth 4 (four) times daily -  before meals and at bedtime.   120 capsule   5   . doxepin (SINEQUAN) 10 MG capsule      TAKE ONE (1) CAPSULE EACH  DAY   30 capsule   5   . doxepin (SINEQUAN) 50 MG capsule      TAKE ONE (1) CAPSULE EACH DAY   30 capsule   5   . fluticasone (FLONASE) 50 MCG/ACT nasal spray               . Fluticasone-Salmeterol (ADVAIR DISKUS) 100-50 MCG/DOSE AEPB   Inhalation   Inhale 1 puff into the lungs 2 (two) times daily.   60 each   5   . loratadine (CLARITIN) 10 MG tablet   Oral   Take by mouth.         . oxybutynin (DITROPAN XL) 15 MG 24 hr tablet               . pentosan polysulfate (ELMIRON) 100 MG capsule               . ULTRAM 50 MG tablet      TAKE ONE TO TWO TABLETS AS NEEDED   60 tablet   5   . VENTOLIN HFA 108 (90 BASE) MCG/ACT inhaler                 Dispense as written.   . verapamil (CALAN-SR) 180 MG CR tablet               . AMBULATORY NON FORMULARY MEDICATION       Medication Name:Incentive Spirometry Use as directed   1 each   0       Allergies:  Nsaids; Amoxicillin; Ibandronic acid; Antihistamines, chlorpheniramine-type; Aspirin; Actonel ; Butalbital-aspirin-caffeine; Clarithromycin; Codeine; Gatifloxacin; Moxifloxacin; Penicillins; Risedronate sodium; Tylenol with codeine #3 ; and Raloxifene  Review of Systems  Constitutional: Negative for fever, chills, weight loss, malaise/fatigue and diaphoresis.  HENT: Positive for congestion and sore throat. Negative for ear discharge, ear pain, hearing loss and tinnitus.   Eyes: Negative for blurred vision and photophobia.  Respiratory: Positive for cough, sputum production, shortness of breath and wheezing. Negative for hemoptysis.   Cardiovascular: Negative for chest pain and palpitations.  Gastrointestinal: Negative for heartburn, nausea, vomiting and abdominal pain.  Genitourinary: Negative for dysuria.  Musculoskeletal: Negative for myalgias.  Skin: Negative for rash.  Neurological: Positive for headaches. Negative for dizziness, tingling and weakness.  Endo/Heme/Allergies: Negative for environmental allergies. Does not bruise/bleed easily.     Physical Examination:   VS: BP 132/66 mmHg  Pulse 84  Ht 5\' 1"  (1.549 m)  Wt 112 lb 3.2 oz (50.894 kg)  BMI 21.21 kg/m2  SpO2 94%  General Appearance: No distress  Neuro:without focal findings, mental status, speech normal, alert and oriented, cranial nerves 2-12 intact, reflexes normal and symmetric, sensation grossly normal  HEENT: PERRLA, EOM intact, no ptosis, no other lesions noticed; Mallampati 1 Pulmonary:  Good upper airway sounds, there are mild fine basilar crackles noted, mild fine basilar wheezes noted.  Sputum Production:  None in the office CardiovascularNormal S1,S2.  No m/r/g.  Abdominal aorta pulsation normal.    Abdomen: Benign, Soft, non-tender, No masses, hepatosplenomegaly, No lymphadenopathy Renal:  No costovertebral tenderness    GU:  No performed at this time. Endoc: No evident thyromegaly, no signs of acromegaly or Cushing features Skin:   warm, no rashes, no ecchymosis  Extremities: normal, no cyanosis, clubbing, no edema, warm with normal capillary refill. Other findings:none   Rad results: (The following images and results were reviewed by Dr. Stevenson Clinch on 10/31/2015). 09/2015 EXAM: CHEST 2 VIEW  COMPARISON: August 29, 2015.  FINDINGS: Stable  cardiomediastinal silhouette. No pneumothorax or pleural effusion is noted. Stable elevated left hemidiaphragm is noted. No acute pulmonary disease is noted. Bony thorax is unremarkable.  IMPRESSION: No acute cardiopulmonary abnormality seen.   CXR 06/2015 Mediastinum and hilar structures normal. Bibasilar pleural parenchymal thickening is again noted consistent with scarring. Superimposed mild bibasilar pneumonia cannot be excluded. Stable elevation left hemidiaphragm. Small left pleural effusion. Heart size stable. No acute bony abnormality  IMPRESSION: 1. Bibasilar pleural parenchymal thickening is again noted consistent with scarring. Superimposed mild bibasilar pneumonia cannot be excluded. 2. Small left pleural effusion. Stable elevation left hemidiaphragm .   Assessment and Plan:79 yo female with hypoxia and recurent URI.  Asthma with allergic rhinitis Patient with a history of asthma - currently on prn albuterol, advair 100/50, claritin, flonas  At time will cont with current meds.  Plan - cont with current meds - ICS 5-10 times per day - pfts and 14mwt prior to next visit - CT chest with contrast  Recurrent URI (upper respiratory infection) Chest xrays noted to have scarring, bilaterally, especially in the right base. Scarring poses an area for recurrent infection.  Given her hypoxia, recurrent cough and overall clinical status, a CT chest is warranted to evaluate for any other abnormalities.   Plan - CT Chest with contrast prior  to follow up visit.   Hypoxia Differential - deconditioning, interstitial disease, pulmonary scarring  Plan - pfts, 9mwt - cont with advair, prn albuterol - CT chest with contrast    Updated Medication List Outpatient Encounter Prescriptions as of 10/31/2015  Medication Sig  . busPIRone (BUSPAR) 10 MG tablet Take 1 tablet (10 mg total) by mouth 2 (two) times daily.  . Calcium Carbonate-Vitamin D 600-400 MG-UNIT per tablet Take by mouth.  . dicyclomine (BENTYL) 10 MG capsule Take 1 capsule (10 mg total) by mouth 4 (four) times daily -  before meals and at bedtime.  Marland Kitchen doxepin (SINEQUAN) 10 MG capsule TAKE ONE (1) CAPSULE EACH DAY  . doxepin (SINEQUAN) 50 MG capsule TAKE ONE (1) CAPSULE EACH DAY  . fluticasone (FLONASE) 50 MCG/ACT nasal spray   . Fluticasone-Salmeterol (ADVAIR DISKUS) 100-50 MCG/DOSE AEPB Inhale 1 puff into the lungs 2 (two) times daily.  Marland Kitchen loratadine (CLARITIN) 10 MG tablet Take by mouth.  . oxybutynin (DITROPAN XL) 15 MG 24 hr tablet   . pentosan polysulfate (ELMIRON) 100 MG capsule   . ULTRAM 50 MG tablet TAKE ONE TO TWO TABLETS AS NEEDED  . VENTOLIN HFA 108 (90 BASE) MCG/ACT inhaler   . verapamil (CALAN-SR) 180 MG CR tablet   . AMBULATORY NON FORMULARY MEDICATION Medication Name:Incentive Spirometry Use as directed   No facility-administered encounter medications on file as of 10/31/2015.    Orders for this visit: Orders Placed This Encounter  Procedures  . CT Chest W Contrast    Standing Status: Future     Number of Occurrences:      Standing Expiration Date: 12/30/2016    Order Specific Question:  If indicated for the ordered procedure, I authorize the administration of contrast media per Radiology protocol    Answer:  Yes    Order Specific Question:  Reason for Exam (SYMPTOM  OR DIAGNOSIS REQUIRED)    Answer:  Hypoxia    Order Specific Question:  Preferred imaging location?    Answer:  [facility]  . Basic Metabolic Panel (BMET)    Standing  Status: Future     Number of Occurrences:      Standing Expiration  Date: 10/30/2016  . Pulmonary function test    Standing Status: Future     Number of Occurrences:      Standing Expiration Date: 10/30/2016    Order Specific Question:  Where should this test be performed?    Answer:  [facility]    Order Specific Question:  MIP/MEP    Answer:  No    Order Specific Question:  6 minute walk    Answer:  Yes    Order Specific Question:  ABG    Answer:  No    Order Specific Question:  Diffusion capacity (DLCO)    Answer:  Yes    Order Specific Question:  Lung volumes    Answer:  Yes    Order Specific Question:  Methacholine challenge    Answer:  No  . 6 minute walk    Standing Status: Future     Number of Occurrences:      Standing Expiration Date: 10/30/2016     Thank  you for the consultation and for allowing [facility] to assist in the care of your patient. Our recommendations are noted above.  Please contact us if we can be of further service.   Vilinda Boehringer, MD [facility] Office Number: [phone]  Note: This note was prepared with Dragon dictation along with smaller phrase technology. Any transcriptional errors that result from this process are unintentional.

## 2015-10-31 NOTE — Patient Instructions (Addendum)
Follow up with Dr. Stevenson Clinch in: 2 months - pulmonary function testing and 6 minute walk test prior to follow up - CT chest with contrast for hypoxia, sob, dyspnea on exertion - cont with your current inhalers - incentive spirometry - 5 to 10 times per day.

## 2015-10-31 NOTE — Assessment & Plan Note (Addendum)
Differential - deconditioning, interstitial disease, pulmonary scarring  Plan - pfts, 33mwt - cont with advair, prn albuterol - CT chest with contrast

## 2015-11-02 ENCOUNTER — Telehealth: Payer: Self-pay | Admitting: *Deleted

## 2015-11-02 NOTE — Telephone Encounter (Signed)
Patient has some questions regarding the 6 minute walk. She is having bad back problems. Please call patient.

## 2015-11-02 NOTE — Telephone Encounter (Signed)
Spoke with pt in regards to [facility]. Pt's questions answered. Pt rescheduled appts due to back pain and wanting to do after steroid injection on 11/24/15. Nothing further needed

## 2015-11-02 NOTE — Telephone Encounter (Signed)
LM for pt to returncall

## 2015-11-03 ENCOUNTER — Ambulatory Visit: Payer: Medicare Other

## 2015-11-13 ENCOUNTER — Ambulatory Visit
Admission: RE | Admit: 2015-11-13 | Discharge: 2015-11-13 | Disposition: A | Discharge status: Home / Self Care | Payer: Medicare Other | Source: Ambulatory Visit | Attending: Internal Medicine | Admitting: Internal Medicine

## 2015-11-13 DIAGNOSIS — J069 Acute upper respiratory infection, unspecified: Secondary | ICD-10-CM | POA: Insufficient documentation

## 2015-11-13 DIAGNOSIS — R0902 Hypoxemia: Secondary | ICD-10-CM | POA: Insufficient documentation

## 2015-11-13 LAB — POCT I-STAT CREATININE: CREATININE: 0.8 mg/dL (ref 0.44–1.00)

## 2015-11-13 MED ORDER — IOHEXOL 300 MG/ML  SOLN
75.0000 mL | Freq: Once | INTRAMUSCULAR | Status: AC | PRN
  Administered 2015-11-13: 75 mL via INTRAVENOUS

## 2015-11-14 ENCOUNTER — Telehealth: Payer: Self-pay | Admitting: *Deleted

## 2015-11-14 DIAGNOSIS — J069 Acute upper respiratory infection, unspecified: Secondary | ICD-10-CM

## 2015-11-14 DIAGNOSIS — R0902 Hypoxemia: Secondary | ICD-10-CM

## 2015-11-14 NOTE — Telephone Encounter (Signed)
Pt states her cough is better since she started the Claritin D but SOB is the same and she feels better. She is scheduled for [facility] and PFT Dec 13th. Will place order for CT scan in approx 5 months. Nothing further needed.

## 2015-11-14 NOTE — Telephone Encounter (Signed)
-----   Message from Vilinda Boehringer, MD sent at 11/14/2015  8:57 AM EST ----- Regarding: CT results Please informed patient of the CT chest results: -Findings consistent with recent pneumonia especially in the left lower lobes. -Has some mild scarring in the right base, but this is chronic  If patient is still having cough, shortness of breath, wheezing, sputum production and is not feeling well then we may need to set up a bronchoscopy. Otherwise, if she is doing better and her symptoms are resolving we will just repeat a CT in 3-6 months.   Thanks -VM

## 2015-11-28 ENCOUNTER — Ambulatory Visit: Payer: Medicare Other

## 2015-12-06 ENCOUNTER — Telehealth: Payer: Self-pay | Admitting: *Deleted

## 2015-12-06 MED ORDER — VENTOLIN HFA 108 (90 BASE) MCG/ACT IN AERS: 2.0000 | INHALATION_SPRAY | RESPIRATORY_TRACT | Status: DC | PRN

## 2015-12-06 NOTE — Telephone Encounter (Signed)
Pt is calling needing a refill on:  Ventolin HFA 9 mcg/ Inhaler Please send to [facility]  Call patient if we can and once this is done.

## 2015-12-06 NOTE — Telephone Encounter (Signed)
Pt informed medication has been sent to her pharmacy. Nothing further needed.

## 2015-12-12 ENCOUNTER — Ambulatory Visit: Payer: Medicare Other

## 2015-12-20 DIAGNOSIS — M810 Age-related osteoporosis without current pathological fracture: Secondary | ICD-10-CM | POA: Diagnosis not present

## 2015-12-25 ENCOUNTER — Ambulatory Visit: Payer: Medicare Other

## 2016-01-18 DIAGNOSIS — N3010 Interstitial cystitis (chronic) without hematuria: Secondary | ICD-10-CM | POA: Diagnosis not present

## 2016-01-18 DIAGNOSIS — R3915 Urgency of urination: Secondary | ICD-10-CM | POA: Diagnosis not present

## 2016-01-18 DIAGNOSIS — R350 Frequency of micturition: Secondary | ICD-10-CM | POA: Diagnosis not present

## 2016-01-22 ENCOUNTER — Telehealth: Payer: Self-pay | Admitting: *Deleted

## 2016-01-22 NOTE — Telephone Encounter (Signed)
Pt called stating she is still unable to do the walk due to her back. States she wants to cancel her f/u appt and will call back to schedule SMW/PFT and f/u appt w/VM at a later date.  Dr. Florene Glen for you.

## 2016-01-22 NOTE — Telephone Encounter (Signed)
Patient called and is still in back pain and unable to do the 6 min walk. She is unsure whether to keep her appt for tomorrow. Patient is still sob and on her inhalers. Please call patient.

## 2016-01-23 ENCOUNTER — Ambulatory Visit: Payer: Medicare Other | Admitting: Internal Medicine

## 2016-02-16 DIAGNOSIS — M25551 Pain in right hip: Secondary | ICD-10-CM | POA: Diagnosis not present

## 2016-03-01 ENCOUNTER — Other Ambulatory Visit: Payer: Self-pay | Admitting: Family Medicine

## 2016-03-01 ENCOUNTER — Other Ambulatory Visit: Payer: Self-pay | Admitting: Internal Medicine

## 2016-03-01 NOTE — Telephone Encounter (Signed)
Printed, please fax or call in to pharmacy. Thank you.   

## 2016-03-01 NOTE — Telephone Encounter (Signed)
Last ov 09/27/15

## 2016-03-06 DIAGNOSIS — M25551 Pain in right hip: Secondary | ICD-10-CM | POA: Diagnosis not present

## 2016-03-06 DIAGNOSIS — M8448XA Pathological fracture, other site, initial encounter for fracture: Secondary | ICD-10-CM | POA: Diagnosis not present

## 2016-03-27 DIAGNOSIS — Z79899 Other long term (current) drug therapy: Secondary | ICD-10-CM | POA: Diagnosis not present

## 2016-03-27 DIAGNOSIS — M4806 Spinal stenosis, lumbar region: Secondary | ICD-10-CM | POA: Diagnosis not present

## 2016-03-27 DIAGNOSIS — M5416 Radiculopathy, lumbar region: Secondary | ICD-10-CM | POA: Diagnosis not present

## 2016-03-28 ENCOUNTER — Other Ambulatory Visit: Payer: Self-pay | Admitting: Family Medicine

## 2016-04-04 ENCOUNTER — Other Ambulatory Visit: Payer: Self-pay | Admitting: Family Medicine

## 2016-04-04 DIAGNOSIS — M810 Age-related osteoporosis without current pathological fracture: Secondary | ICD-10-CM

## 2016-04-09 ENCOUNTER — Encounter: Payer: Self-pay | Admitting: Family Medicine

## 2016-04-09 ENCOUNTER — Ambulatory Visit (INDEPENDENT_AMBULATORY_CARE_PROVIDER_SITE_OTHER): Payer: Medicare HMO | Admitting: Family Medicine

## 2016-04-09 VITALS — BP 130/76 | HR 68 | Temp 98.4°F | Resp 20 | Ht 61.0 in | Wt 116.0 lb

## 2016-04-09 DIAGNOSIS — R0902 Hypoxemia: Secondary | ICD-10-CM

## 2016-04-09 DIAGNOSIS — J4530 Mild persistent asthma, uncomplicated: Secondary | ICD-10-CM | POA: Diagnosis not present

## 2016-04-09 MED ORDER — MONTELUKAST SODIUM 10 MG PO TABS: 10.0000 mg | ORAL_TABLET | Freq: Every day | ORAL | Status: DC

## 2016-04-09 MED ORDER — VENTOLIN HFA 108 (90 BASE) MCG/ACT IN AERS: INHALATION_SPRAY | RESPIRATORY_TRACT | Status: DC

## 2016-04-09 NOTE — Progress Notes (Signed)
Patient ID: Jackie Turner, female   DO: 02/09/1936, 80 y.o.   MRM: [id number]       Patient: Jackie Turner Female    DO: 23-Aug-1936   80 y.o.   MRM: [id number] Visit Date: 04/09/2016  Today's Provider: Margarita Rana, MD   Chief Complaint  Patient presents with  . Shortness of Breath   Subjective:    HPI  Follow up for shortness of breath  The patient was last seen for this 6 months ago. Changes made at last visit include referred to pulmonology.  She reports excellent compliance with treatment. She feels that condition is Unchanged. She is not having side effects.  Patient reports that she uses Advair daily and ventolin 2 puffs 2-3 times daily. Patient reports that shortness of breath is worse with activity. Patient reports that pulmonologist wanted to do a walking test but was unable to complete do to her back pain.  ------------------------------------------------------------------------------------     Allergies  Allergen Reactions  . Nsaids Other (See Comments)    History of gastric ulcer  . Amoxicillin Nausea Only  . Ibandronic Acid Other (See Comments)    Muscle weakness - advised not to take  . Antihistamines, Chlorpheniramine-Type   . Aspirin     Gastric ulcer  . Actonel  [Risedronate] Other (See Comments)    Gastric ulcers  . Butalbital-Aspirin-Caffeine Other (See Comments)    Other reaction(s): Unknown  . Clarithromycin Other (See Comments)    Bloating Bloating Bloating  . Codeine   . Gatifloxacin Other (See Comments)  . Moxifloxacin   . Penicillins Other (See Comments)  . Risedronate Sodium     Gastric ulcer  . Tylenol With Codeine #3  [Acetaminophen-Codeine]     Other reaction(s): Unknown  . Raloxifene Other (See Comments)    Bloating bloating   Previous Medications   CALCIUM CARBONATE-VITAMIN D 600-400 MG-UNIT PER TABLET    Take 1 tablet by mouth 2 (two) times daily.    DICYCLOMINE (BENTYL) 10 MG CAPSULE    Take 1 capsule (10 mg  total) by mouth 4 (four) times daily -  before meals and at bedtime.   DOXEPIN (SINEQUAN) 10 MG CAPSULE    TAKE 1 CAPSULE BY MOUTH EVERY DAY.   DOXEPIN (SINEQUAN) 50 MG CAPSULE    TAKE ONE (1) CAPSULE EACH DAY   FLUTICASONE (FLONASE) 50 MCG/ACT NASAL SPRAY    Place 2 sprays into both nostrils daily.    FLUTICASONE-SALMETEROL (ADVAIR DISKUS) 100-50 MCG/DOSE AEPB    Inhale 1 puff into the lungs 2 (two) times daily.   OXYBUTYNIN (DITROPAN XL) 15 MG 24 HR TABLET    Take 15 mg by mouth at bedtime.    PENTOSAN POLYSULFATE (ELMIRON) 100 MG CAPSULE    Take 200 mg by mouth 2 (two) times daily.    ULTRAM 50 MG TABLET    TAKE ONE TO TWO TABLETS BY MOUTH AS NEEDED   VERAPAMIL (CALAN-SR) 180 MG CR TABLET        Review of Systems  Social History  Substance Use Topics  . Smoking status: Never Smoker   . Smokeless tobacco: Never Used  . Alcohol Use: No   Objective:   BP 130/76 mmHg  Pulse 68  Temp(Src) 98.4 F (36.9 C) (Oral)  Resp 20  Ht 5\' 1"  (1.549 m)  Wt 116 lb (52.617 kg)  BMI 21.93 kg/m2  SpO2 94%  Physical Exam  Constitutional: She is oriented to person, place, and time. She appears well-developed  and well-nourished.  Cardiovascular: Normal rate, regular rhythm and normal heart sounds.   Pulmonary/Chest: Effort normal. She has wheezes.  Neurological: She is alert and oriented to person, place, and time.        Assessment & Plan:     1. Hypoxia Recurrent. Not at goal. Patient declined steroid treatment at this time and will wait to see pulmonologist. Patient advised to continue current medication and plan of care. Patient reports that she will call [place] pulmonologist.  2. Asthma, mild persistent, uncomplicated As above. Patient started on Singulair 10 mg as below. Patient advised to call if symptoms are worse before her appointment with pulmonology.  - VENTOLIN HFA 108 (90 Base) MCG/ACT inhaler; INHALE TWO PUFFS INTO THE LUNGS EVERY 4 HOURS AS NEEDED FOR WHEEZING OR SHORTNESS OF  BREATH  Dispense: 18 g; Refill: 3 - montelukast (SINGULAIR) 10 MG tablet; Take 1 tablet (10 mg total) by mouth at bedtime.  Dispense: 30 tablet; Refill: 6      Patient seen and examined by Dr. Jerrell Belfast, and note scribed by Philbert Riser. Dimas, CMA.  I have reviewed the document for accuracy and completeness and I agree with above. - Jerrell Belfast, MD   Margarita Rana, MD  [facility]

## 2016-04-10 ENCOUNTER — Telehealth: Payer: Self-pay | Admitting: Internal Medicine

## 2016-04-10 NOTE — Telephone Encounter (Signed)
Tammy and [name],  Thought I would forward to you. Thanks

## 2016-04-10 NOTE — Telephone Encounter (Signed)
Patient called to cancel ct scan.  She stated she was dismissed from practice as she could not complete the 6 min walk that was ordered and was told if she could not do this there was nothing else Dr. Stevenson Clinch could do for her.  Patient wanted to know what the care plan would have been if she had been wheelchair bound.  Patient stated she would go to [facility].  Called [facility] to cancel CT scan.

## 2016-04-11 DIAGNOSIS — M810 Age-related osteoporosis without current pathological fracture: Secondary | ICD-10-CM | POA: Diagnosis not present

## 2016-04-12 ENCOUNTER — Ambulatory Visit: Payer: Medicare Other

## 2016-04-12 ENCOUNTER — Ambulatory Visit: Payer: Medicare HMO

## 2016-04-12 NOTE — Telephone Encounter (Signed)
Printed out copy of phone note and gave to Tammy since she does not use Epic.. Will forward to [place] for follow up.

## 2016-04-17 ENCOUNTER — Encounter: Payer: Medicare HMO | Admitting: Physician Assistant

## 2016-04-22 ENCOUNTER — Ambulatory Visit (INDEPENDENT_AMBULATORY_CARE_PROVIDER_SITE_OTHER): Payer: Medicare HMO | Admitting: Physician Assistant

## 2016-04-22 ENCOUNTER — Encounter: Payer: Self-pay | Admitting: Physician Assistant

## 2016-04-22 VITALS — BP 122/78 | HR 68 | Temp 98.0°F | Resp 16 | Ht 61.0 in | Wt 107.0 lb

## 2016-04-22 DIAGNOSIS — R5383 Other fatigue: Secondary | ICD-10-CM

## 2016-04-22 DIAGNOSIS — K589 Irritable bowel syndrome without diarrhea: Secondary | ICD-10-CM

## 2016-04-22 DIAGNOSIS — E7800 Pure hypercholesterolemia, unspecified: Secondary | ICD-10-CM

## 2016-04-22 DIAGNOSIS — Z1239 Encounter for other screening for malignant neoplasm of breast: Secondary | ICD-10-CM

## 2016-04-22 DIAGNOSIS — R69 Illness, unspecified: Secondary | ICD-10-CM | POA: Diagnosis not present

## 2016-04-22 DIAGNOSIS — F4321 Adjustment disorder with depressed mood: Secondary | ICD-10-CM | POA: Diagnosis not present

## 2016-04-22 DIAGNOSIS — J45909 Unspecified asthma, uncomplicated: Secondary | ICD-10-CM | POA: Diagnosis not present

## 2016-04-22 DIAGNOSIS — Z0000 Encounter for general adult medical examination without abnormal findings: Secondary | ICD-10-CM

## 2016-04-22 MED ORDER — DICYCLOMINE HCL 10 MG PO CAPS: 10.0000 mg | ORAL_CAPSULE | Freq: Three times a day (TID) | ORAL | Status: DC | PRN

## 2016-04-22 MED ORDER — FLUTICASONE-SALMETEROL 100-50 MCG/DOSE IN AEPB: 1.0000 | INHALATION_SPRAY | Freq: Two times a day (BID) | RESPIRATORY_TRACT | Status: DC

## 2016-04-22 MED ORDER — DOXEPIN HCL 10 MG PO CAPS: ORAL_CAPSULE | ORAL | Status: DC

## 2016-04-22 NOTE — Patient Instructions (Signed)

## 2016-04-22 NOTE — Progress Notes (Signed)
Patient ID: Jackie Turner, female   DOB: Nov 28, 1936, 80 y.o.   MRN: [id number]       Patient: Jackie Turner, Female    DOB: 09/04/1936, 80 y.o.   MRN: [id number] Visit Date: 04/22/2016  Today's Provider: Mar Daring, PA-C   Chief Complaint  Patient presents with  . Medicare wellness exam   Subjective:    Annual wellness visit Jackie Turner is a 81 y.o. female. She feels well. She reports exercising not regularly. She reports she is sleeping fairly well.  Last: Mammogram- 05/04/2015. Normal. [facility]  BMD- 05/09/2015. Normal. Repeat 2 years.   Colonoscopy- done at [facility] many years ago; normal and said  she would never have another due to bad effects from  Cleanse. Other Providers: Dr. Lorelle Gibbs - pain management; back pain       Dr. Amalia Hailey - Urology       Going to see a pulmonologist at [facility]  Constitutional: Negative.   Eyes: Negative.   Respiratory: Negative.   Cardiovascular: Negative.   Gastrointestinal: Negative.   Endocrine: Negative.   Genitourinary: Negative.   Musculoskeletal: Positive for back pain and arthralgias. Negative for myalgias, joint swelling, gait problem, neck pain and neck stiffness.       Patient reports that she has chronic back pain.   Skin: Negative.   Neurological: Positive for headaches. Negative for dizziness, tremors, seizures, syncope, facial asymmetry, speech difficulty, weakness, light-headedness and numbness.       Patient has history of migraines. Patient reports that headache is not more than usual.   Hematological: Negative.   Psychiatric/Behavioral: Negative.     Social History   Social History  . Marital Status: Widowed    Spouse Name: Jackie Turner  . Number of Children: 0  . Years of Education: H/S   Occupational History  . Retired    Social History Main Topics  . Smoking status: Never Smoker   . Smokeless tobacco: Never Used  . Alcohol Use: No  . Drug Use: No  . Sexual  Activity: Not on file   Other Topics Concern  . Not on file   Social History Narrative    Past Medical History  Diagnosis Date  . Hyperlipidemia   . Allergy   . Asthma   . Osteoporosis      Patient Active Problem List   Diagnosis Date Noted  . Recurrent URI (upper respiratory infection) 10/31/2015  . Hypoxia 09/27/2015  . Abnormal CXR 09/27/2015  . Asthma with allergic rhinitis 08/31/2015  . Back ache 08/31/2015  . Chronic interstitial cystitis 08/31/2015  . Grief reaction 08/31/2015  . Hypercholesteremia 06/26/2015  . Migraine 06/26/2015  . Asthma 06/26/2015  . IBS (irritable bowel syndrome) 06/26/2015  . Osteoporosis 06/26/2015  . Fatigue 06/26/2015  . Lumbar radiculopathy 04/27/2013    Past Surgical History  Procedure Laterality Date  . Abdominal hysterectomy  2003  . Tonsillectomy  1961  . Eye surgery Bilateral     cataract extraction  . Breast biopsy  1976, 1978    benign  . Carpal tunnel repair Bilateral     R 1990; L 1997  . Hand tendon surgery Right     Trigger finger    Her family history includes Breast cancer in her sister; CAD in her mother; Cancer in her sister; Congestive Heart Failure in her mother; Heart disease in her mother; Osteoarthritis in her mother.    Previous Medications   CALCIUM CARBONATE-VITAMIN  D 600-400 MG-UNIT PER TABLET    Take 1 tablet by mouth 2 (two) times daily.    DICYCLOMINE (BENTYL) 10 MG CAPSULE    Take 1 capsule (10 mg total) by mouth 4 (four) times daily -  before meals and at bedtime.   DOXEPIN (SINEQUAN) 10 MG CAPSULE    TAKE 1 CAPSULE BY MOUTH EVERY DAY.   DOXEPIN (SINEQUAN) 50 MG CAPSULE    TAKE ONE (1) CAPSULE EACH DAY   FLUTICASONE (FLONASE) 50 MCG/ACT NASAL SPRAY    Place 2 sprays into both nostrils daily.    FLUTICASONE-SALMETEROL (ADVAIR DISKUS) 100-50 MCG/DOSE AEPB    Inhale 1 puff into the lungs 2 (two) times daily.   MONTELUKAST (SINGULAIR) 10 MG TABLET    Take 1 tablet (10 mg total) by mouth at bedtime.    OXYBUTYNIN (DITROPAN XL) 15 MG 24 HR TABLET    Take 15 mg by mouth at bedtime.    PENTOSAN POLYSULFATE (ELMIRON) 100 MG CAPSULE    Take 200 mg by mouth 2 (two) times daily.    ULTRAM 50 MG TABLET    TAKE ONE TO TWO TABLETS BY MOUTH AS NEEDED   VENTOLIN HFA 108 (90 BASE) MCG/ACT INHALER    INHALE TWO PUFFS INTO THE LUNGS EVERY 4 HOURS AS NEEDED FOR WHEEZING OR SHORTNESS OF BREATH   VERAPAMIL (CALAN-SR) 180 MG CR TABLET        Patient Care Team: Margarita Rana, MD as PCP - General (Family Medicine)     Objective:   Vitals: Wt 107 lb (48.535 kg)  Physical Exam  Constitutional: She is oriented to person, place, and time. She appears well-developed and well-nourished. No distress.  HENT:  Head: Normocephalic and atraumatic.  Right Ear: External ear normal.  Left Ear: External ear normal.  Nose: Nose normal.  Mouth/Throat: Oropharynx is clear and moist. No oropharyngeal exudate.  Eyes: Conjunctivae and EOM are normal. Pupils are equal, round, and reactive to light. Right eye exhibits no discharge. Left eye exhibits no discharge. No scleral icterus.  Neck: Normal range of motion. Neck supple. No JVD present. No tracheal deviation present. No thyromegaly present.  Cardiovascular: Normal rate, regular rhythm, normal heart sounds and intact distal pulses.  Exam reveals no gallop and no friction rub.   No murmur heard. Pulmonary/Chest: Effort normal and breath sounds normal. No respiratory distress. She has no wheezes. She has no rales. She exhibits no tenderness.  Abdominal: Soft. Bowel sounds are normal. She exhibits no distension and no mass. There is no tenderness. There is no rebound and no guarding.  Musculoskeletal: Normal range of motion. She exhibits no edema or tenderness.  Lymphadenopathy:    She has no cervical adenopathy.  Neurological: She is alert and oriented to person, place, and time.  Skin: Skin is warm and dry. No rash noted. She is not diaphoretic.  Psychiatric: She has a  normal mood and affect. Her behavior is normal. Judgment and thought content normal.  Vitals reviewed.   Activities of Daily Living In your present state of health, do you have any difficulty performing the following activities: 04/22/2016  Hearing? N  Vision? N  Difficulty concentrating or making decisions? N  Walking or climbing stairs? Y  Dressing or bathing? N  Doing errands, shopping? N    Fall Risk Assessment Fall Risk  04/22/2016  Falls in the past year? No     Depression Screen PHQ 2/9 Scores 04/22/2016  PHQ - 2 Score 0  Cognitive Testing - 6-CIT  Correct? Score   What year is it? yes 0 0 or 4  What month is it? yes 0 0 or 3  Memorize:    [place]ia Mau,  44,  [place],  yes 0   What time is it? (within 1 hour) yes 0 0 or 3  Count backwards from 20 yes 0 0, 2, or 4  Name the months of the year yes 0 0, 2, or 4  Repeat name & address above no 4 0, 2, 4, 6, 8, or 10       TOTAL SCORE  4/28   Interpretation:  Normal  Normal (0-7) Abnormal (8-28)       Assessment & Plan:     Annual Wellness Visit  Reviewed patient's Family Medical History Reviewed and updated list of patient's medical providers Assessment of cognitive impairment was done Assessed patient's functional ability Established a written schedule for health screening [place] Completed and Reviewed  Exercise Activities and Dietary recommendations Goals    None      Immunization History  Administered Date(s) Administered  . Influenza, High Dose Seasonal PF 09/27/2015  . Pneumococcal Conjugate-13 02/09/2015  . Pneumococcal Polysaccharide-23 10/07/2003  . Td 05/14/2007    Health Maintenance  Topic Date Due  . ZOSTAVAX  12/27/1997  . INFLUENZA VACCINE  07/16/2016  . TETANUS/TDAP  05/13/2017  . DEXA SCAN  Completed  . PNA vac Low Risk Adult  Completed      Discussed health benefits of physical activity, and encouraged her to engage in regular exercise  appropriate for her age and condition.  1. Medicare annual wellness visit, subsequent Normal exam today.  2. Breast cancer screening Mammogram ordered as below. Has mammograms done at [place]. - MM Digital Screening; Future  3. IBS (irritable bowel syndrome) Stable. Diagnosis pulled for medication refill. Continue current medical treatment plan. - dicyclomine (BENTYL) 10 MG capsule; Take 1 capsule (10 mg total) by mouth 3 (three) times daily as needed for spasms.  Dispense: 120 capsule; Refill: 5 - CBC with Differential - Comprehensive Metabolic Panel (CMET)  4. Asthma, unspecified asthma severity, uncomplicated Stable. Diagnosis pulled for medication refill. Continue current medical treatment plan. - Fluticasone-Salmeterol (ADVAIR DISKUS) 100-50 MCG/DOSE AEPB; Inhale 1 puff into the lungs 2 (two) times daily.  Dispense: 60 each; Refill: 5  5. Grief reaction Stable. Diagnosis pulled for medication refill. Continue current medical treatment plan. - doxepin (SINEQUAN) 10 MG capsule; TAKE 1 CAPSULE BY MOUTH EVERY DAY.  Dispense: 90 capsule; Refill: 0  6. Hypercholesteremia H/O this. Diet controlled. Will check labs and f/u pending results.  - Lipid Profile  7. Other fatigue Will check labs and f/u pending results. - Comprehensive Metabolic Panel (CMET) - TSH   ------------------------------------------------------------------------------------------------------------

## 2016-04-23 ENCOUNTER — Telehealth: Payer: Self-pay | Admitting: Family Medicine

## 2016-04-23 DIAGNOSIS — M25551 Pain in right hip: Secondary | ICD-10-CM | POA: Diagnosis not present

## 2016-04-23 NOTE — Telephone Encounter (Signed)
Please advise.  Thanks,  -Lawren Sexson 

## 2016-04-23 NOTE — Telephone Encounter (Signed)
Pt called saying [facility] told her they needed a PR on the dicyclomine (BENTYL) 10 MG capsule  Pt's call back is [phone]  Thanks, Con Memos

## 2016-04-24 NOTE — Telephone Encounter (Signed)
That was one I had filled out yesterday and put on your desk to fax. I have not heard back from insurance company on approval or denial yet.

## 2016-04-24 NOTE — Telephone Encounter (Signed)
I faxed the form yesterday afternoon.  Thanks,  -Laterica Matarazzo

## 2016-05-03 ENCOUNTER — Ambulatory Visit (INDEPENDENT_AMBULATORY_CARE_PROVIDER_SITE_OTHER): Payer: Medicare HMO | Admitting: Physician Assistant

## 2016-05-03 ENCOUNTER — Encounter: Payer: Self-pay | Admitting: Physician Assistant

## 2016-05-03 VITALS — BP 112/80 | HR 75 | Temp 98.2°F | Resp 16 | Wt 115.2 lb

## 2016-05-03 DIAGNOSIS — T7840XA Allergy, unspecified, initial encounter: Secondary | ICD-10-CM

## 2016-05-03 MED ORDER — PREDNISONE 10 MG (21) PO TBPK: 10.0000 mg | ORAL_TABLET | Freq: Every day | ORAL | Status: DC

## 2016-05-03 NOTE — Progress Notes (Signed)
Patient: Jackie Turner Female    DOB: [birth year]   80 y.o.   MRN: [id number] Visit Date: 05/03/2016  Today's Provider: Mar Daring, PA-C   Chief Complaint  Patient presents with  . Allergic Reaction   Subjective:    Allergic Reaction This is a new (PAtient went to her Dental appointment and was prescribed Amox) problem. The current episode started 3 to 5 days ago. The problem occurs constantly. The patient was exposed to an antibiotic. Associated symptoms include itching and a rash. Pertinent negatives include no abdominal pain, chest pain, chest pressure, coughing, difficulty breathing, drooling, trouble swallowing or vomiting. Treatments tried: Benadryl every 5 hours. took Benadryl at 7 this morning. The treatment provided no relief.   She had a tooth pulled last week. She was started on amoxicillin following that. She completed the course of amoxicillin and had called the dentist back for swelling that persisted of the left jaw. This was yesterday. They sent her in clindamycin. She took one dose of the clindamycin when the rash started. The rash has continued to spread and itch. She feels that it was related to the amoxicillin since she had completed that course. She has not taken the clindamycin at all since the rash developed. She is going to call her dentist this afternoon to let him know if the rash.    Allergies  Allergen Reactions  . Nsaids Other (See Comments)    History of gastric ulcer  . Amoxicillin Nausea Only  . Ibandronic Acid Other (See Comments)    Muscle weakness - advised not to take  . Antihistamines, Chlorpheniramine-Type   . Aspirin     Gastric ulcer  . Actonel  [Risedronate] Other (See Comments)    Gastric ulcers  . Butalbital-Aspirin-Caffeine Other (See Comments)    Other reaction(s): Unknown  . Clarithromycin Other (See Comments)    Bloating Bloating Bloating  . Codeine   . Gatifloxacin Other (See Comments)  . Moxifloxacin   .  Penicillins Other (See Comments)  . Risedronate Sodium     Gastric ulcer  . Tylenol With Codeine #3  [Acetaminophen-Codeine]     Other reaction(s): Unknown  . Raloxifene Other (See Comments)    Bloating bloating   Previous Medications   CALCIUM CARBONATE-VITAMIN D 600-400 MG-UNIT PER TABLET    Take 1 tablet by mouth 2 (two) times daily.    DICYCLOMINE (BENTYL) 10 MG CAPSULE    Take 1 capsule (10 mg total) by mouth 3 (three) times daily as needed for spasms.   DOXEPIN (SINEQUAN) 10 MG CAPSULE    TAKE 1 CAPSULE BY MOUTH EVERY DAY.   DOXEPIN (SINEQUAN) 50 MG CAPSULE    TAKE ONE (1) CAPSULE EACH DAY   FLUTICASONE (FLONASE) 50 MCG/ACT NASAL SPRAY    Place 2 sprays into both nostrils daily.    FLUTICASONE-SALMETEROL (ADVAIR DISKUS) 100-50 MCG/DOSE AEPB    Inhale 1 puff into the lungs 2 (two) times daily.   MONTELUKAST (SINGULAIR) 10 MG TABLET    Take 1 tablet (10 mg total) by mouth at bedtime.   OXYBUTYNIN (DITROPAN XL) 15 MG 24 HR TABLET    Take 15 mg by mouth at bedtime.    PENTOSAN POLYSULFATE (ELMIRON) 100 MG CAPSULE    Take 200 mg by mouth 2 (two) times daily.    ULTRAM 50 MG TABLET    TAKE ONE TO TWO TABLETS BY MOUTH AS NEEDED   VENTOLIN HFA 108 (90 BASE) MCG/ACT INHALER  INHALE TWO PUFFS INTO THE LUNGS EVERY 4 HOURS AS NEEDED FOR WHEEZING OR SHORTNESS OF BREATH   VERAPAMIL (CALAN-SR) 180 MG CR TABLET        Review of Systems  Constitutional: Negative.   HENT: Positive for facial swelling (swllen left jaw from tooth extraction). Negative for congestion, drooling, mouth sores, sore throat and trouble swallowing.   Respiratory: Negative for cough, chest tightness and shortness of breath.   Cardiovascular: Negative for chest pain.  Gastrointestinal: Negative for nausea, vomiting and abdominal pain.  Skin: Positive for itching and rash.  Neurological: Negative for dizziness, weakness, light-headedness and headaches.    Social History  Substance Use Topics  . Smoking status: Never  Smoker   . Smokeless tobacco: Never Used  . Alcohol Use: No   Objective:   BP 112/80 mmHg  Pulse 75  Temp(Src) 98.2 F (36.8 C) (Oral)  Resp 16  Wt 115 lb 3.2 oz (52.254 kg)  SpO2 96%  Physical Exam  Constitutional: She appears well-developed and well-nourished. No distress.  HENT:  Head: Normocephalic and atraumatic.    Neck: Normal range of motion. Neck supple. No tracheal deviation present. No thyromegaly present.  Cardiovascular: Normal rate, regular rhythm and normal heart sounds.  Exam reveals no gallop and no friction rub.   No murmur heard. Pulmonary/Chest: Effort normal and breath sounds normal. No respiratory distress. She has no wheezes. She has no rales.  Lymphadenopathy:    She has no cervical adenopathy.  Skin: Rash noted. Rash is urticarial (diffusely located from neck down trunk to upper thighs; pruritic). She is not diaphoretic.  Vitals reviewed.       Assessment & Plan:     1. Allergic reaction, initial encounter Will give prednisone as below. Advised to not take the amoxicillin or clindamycin at this time until otherwise directed by dentist continue Benadryl as needed every 4-6 hours for itching. Also advised she may take Zantac 150 mg 1 tab twice daily if needed as well. She voiced understanding. She is to call 9-1-1 if she develops any increased shortness of breath or swelling of the tongue or mouth. She is to call the office if symptoms fail to improve or worsen. - predniSONE (STERAPRED UNI-PAK 21 TAB) 10 MG (21) TBPK tablet; Take 1 tablet (10 mg total) by mouth daily. Take as directed on package directions  Dispense: 21 tablet; Refill: 0       Mar Daring, PA-C  [facility]

## 2016-05-03 NOTE — Patient Instructions (Signed)
Benadryl every 4-6 hours Prednisone as directed Zantac 130m BID  Drug Allergy Allergic reactions to medicines are common. Some allergic reactions are mild. A delayed type of drug allergy that occurs 1 week or more after exposure to a medicine or vaccine is called serum sickness. A life-threatening, sudden (acute) allergic reaction that involves the whole body is called anaphylaxis. CAUSES  "True" drug allergies occur when there is an allergic reaction to a medicine. This is caused by overactivity of the immune system. First, the body becomes sensitized. The immune system is triggered by your first exposure to the medicine. Following this first exposure, future exposure to the same medicine may be life-threatening. Almost any medicine can cause an allergic reaction. Common ones are:  Penicillin.  Sulfonamides (sulfa drugs).  Local anesthetics.  X-ray dyes that contain iodine. SYMPTOMS  Common symptoms of a minor allergic reaction are:  Swelling around the mouth.  An itchy red rash or hives.  Vomiting or diarrhea. Anaphylaxis can cause swelling of the mouth and throat. This makes it difficult to breathe and swallow. Severe reactions can be fatal within seconds, even after exposure to only a trace amount of the drug that causes the reaction. HOME CARE INSTRUCTIONS  If you are unsure of what caused your reaction, write down:  The names of the medicines you took.  How much medicine you took.  How you took the medicine, such as whether you took a pill, injected the medicine, or applied it to your skin.  All of the things you ate and drank.  The date and time of your reaction.  The symptoms of the reaction.  You may want to follow up with an allergy specialist after the reaction has cleared in order to be tested to confirm the allergy. It is important to confirm that your reaction is an allergy, not just a side effect to the medicine. If you have a true allergy to a medicine, this  may prevent that medicine and related medicines from being given to you when you are very ill.  If you have hives or a rash:  Take medicines as directed by your caregiver.  You may use an over-the-counter antihistamine (diphenhydramine) as needed.  Apply cold compresses to the skin or take baths in cool water. Avoid hot baths or showers.  If you are severely allergic:  Continuous observation after a severe reaction may be needed. Hospitalization is often required.  Wear a medical alert bracelet or necklace stating your allergy.  You and your family must learn how to use an anaphylaxis kit or give an epinephrine injection to temporarily treat an emergency allergic reaction. If you have had a severe reaction, always carry your epinephrine injection or anaphylaxis kit with you. This can be lifesaving if you have a severe reaction.  Do not drive or perform tasks after treatment until the medicines used to treat your reaction have worn off, or until your caregiver says it is okay.  If you have a drug allergy that was confirmed by your health care provider:  Carry information about the drug allergy with you at all times.  Always check with a pharmacist before taking any over-the-counter medicine. SEEK MEDICAL CARE IF:   You think you had an allergic reaction. Symptoms usually start within 30 minutes after exposure.  Symptoms are getting worse rather than better.  You develop new symptoms.  The symptoms that brought you to your caregiver return. SEEK IMMEDIATE MEDICAL CARE IF:   You have swelling of  the mouth, difficulty breathing, or wheezing.  You have a tight feeling in your chest or throat.  You develop hives, swelling, or itching all over your body.  You develop severe vomiting or diarrhea.  You feel faint or pass out. This is an emergency. Use your epinephrine injection or anaphylaxis kit as you have been instructed. Call for emergency medical help. Even if you improve  after the injection, you need to be examined at a hospital emergency department. MAKE SURE YOU:   Understand these instructions.  Will watch your condition.  Will get help right away if you are not doing well or get worse.   This information is not intended to replace advice given to you by your health care provider. Make sure you discuss any questions you have with your health care provider.   Document Released: 12/02/2005 Document Revised: 12/23/2014 Document Reviewed: 07/04/2015 Elsevier Interactive Patient Education [employer].

## 2016-06-03 DIAGNOSIS — K589 Irritable bowel syndrome without diarrhea: Secondary | ICD-10-CM | POA: Diagnosis not present

## 2016-06-03 DIAGNOSIS — E7800 Pure hypercholesterolemia, unspecified: Secondary | ICD-10-CM | POA: Diagnosis not present

## 2016-06-03 DIAGNOSIS — R5383 Other fatigue: Secondary | ICD-10-CM | POA: Diagnosis not present

## 2016-06-04 ENCOUNTER — Telehealth: Payer: Self-pay

## 2016-06-04 LAB — CBC WITH DIFFERENTIAL/PLATELET
BASOS ABS: 0.1 10*3/uL (ref 0.0–0.2)
BASOS: 1 %
EOS (ABSOLUTE): 0.4 10*3/uL (ref 0.0–0.4)
Eos: 7 %
HEMOGLOBIN: 14.1 g/dL (ref 11.1–15.9)
Hematocrit: 40.7 % (ref 34.0–46.6)
IMMATURE GRANS (ABS): 0 10*3/uL (ref 0.0–0.1)
Immature Granulocytes: 0 %
LYMPHS ABS: 1.8 10*3/uL (ref 0.7–3.1)
Lymphs: 28 %
MCH: 29.1 pg (ref 26.6–33.0)
MCHC: 34.6 g/dL (ref 31.5–35.7)
MCV: 84 fL (ref 79–97)
Monocytes Absolute: 0.7 10*3/uL (ref 0.1–0.9)
Monocytes: 12 %
NEUTROS ABS: 3.2 10*3/uL (ref 1.4–7.0)
Neutrophils: 52 %
Platelets: 333 10*3/uL (ref 150–379)
RBC: 4.85 x10E6/uL (ref 3.77–5.28)
RDW: 15 % (ref 12.3–15.4)
WBC: 6.2 10*3/uL (ref 3.4–10.8)

## 2016-06-04 LAB — COMPREHENSIVE METABOLIC PANEL
A/G RATIO: 2.4 — AB (ref 1.2–2.2)
ALBUMIN: 4.4 g/dL (ref 3.5–4.8)
ALK PHOS: 63 IU/L (ref 39–117)
ALT: 19 IU/L (ref 0–32)
AST: 18 IU/L (ref 0–40)
BILIRUBIN TOTAL: 0.3 mg/dL (ref 0.0–1.2)
BUN / CREAT RATIO: 27 (ref 12–28)
BUN: 19 mg/dL (ref 8–27)
CHLORIDE: 101 mmol/L (ref 96–106)
CO2: 26 mmol/L (ref 18–29)
Calcium: 9.6 mg/dL (ref 8.7–10.3)
Creatinine, Ser: 0.71 mg/dL (ref 0.57–1.00)
GFR calc non Af Amer: 82 mL/min/{1.73_m2} (ref >59)
GFR, EST AFRICAN AMERICAN: 94 mL/min/{1.73_m2} (ref >59)
GLUCOSE: 109 mg/dL — AB (ref 65–99)
Globulin, Total: 1.8 g/dL (ref 1.5–4.5)
POTASSIUM: 5.7 mmol/L — AB (ref 3.5–5.2)
SODIUM: 144 mmol/L (ref 134–144)
TOTAL PROTEIN: 6.2 g/dL (ref 6.0–8.5)

## 2016-06-04 LAB — LIPID PANEL
CHOL/HDL RATIO: 3 ratio (ref 0.0–4.4)
Cholesterol, Total: 216 mg/dL — ABNORMAL HIGH (ref 100–199)
HDL: 71 mg/dL (ref >39)
LDL Calculated: 129 mg/dL — ABNORMAL HIGH (ref 0–99)
Triglycerides: 80 mg/dL (ref 0–149)
VLDL Cholesterol Cal: 16 mg/dL (ref 5–40)

## 2016-06-04 LAB — TSH: TSH: 3.09 u[IU]/mL (ref 0.450–4.500)

## 2016-06-04 NOTE — Telephone Encounter (Signed)
Patient advised as directed below. She reports that she cannot exercise because of her back and does not take any potassium supplement. She is taking Boost and eats a lot of green leafy vegetables so she said she will go ahead and stop that and see if that helps. She will call a few days before she goes to the lab.  Thanks,  -Joseline

## 2016-06-04 NOTE — Telephone Encounter (Signed)
-----   Message from Mar Daring, [place] sent at 06/04/2016  8:25 AM EDT ----- All labs are within normal limits and stable with a few exceptions. Potassium is slightly elevated so stop any supplements that have potassium and try to avoid potassium rich foods such as bananas and leafy greens for the time being. Will recheck potassium in 2-4 weeks. Also cholesterol is slightly elevated. Limit fatty foods and add physical activity. May benefit from adding an omega 3 supplement such as fish oil 1200 mg BID or mega red. Thanks! -JB

## 2016-06-04 NOTE — Telephone Encounter (Signed)
Perfect. Thanks.

## 2016-06-05 ENCOUNTER — Encounter: Payer: Self-pay | Admitting: Physician Assistant

## 2016-06-05 ENCOUNTER — Ambulatory Visit (INDEPENDENT_AMBULATORY_CARE_PROVIDER_SITE_OTHER): Payer: Medicare HMO | Admitting: Physician Assistant

## 2016-06-05 VITALS — BP 120/78 | HR 69 | Temp 98.2°F | Resp 16 | Wt 117.4 lb

## 2016-06-05 DIAGNOSIS — J0100 Acute maxillary sinusitis, unspecified: Secondary | ICD-10-CM

## 2016-06-05 DIAGNOSIS — Z1239 Encounter for other screening for malignant neoplasm of breast: Secondary | ICD-10-CM

## 2016-06-05 MED ORDER — AZITHROMYCIN 250 MG PO TABS
ORAL_TABLET | ORAL | Status: DC
Start: 2016-06-05 — End: 2016-07-05

## 2016-06-05 NOTE — Progress Notes (Signed)
Patient: Jackie Turner Female    DOB: [birth year]   80 y.o.   MRN: [id number] Visit Date: 06/05/2016  Today's Provider: Mar Daring, PA-C   Chief Complaint  Patient presents with  . Headache   Subjective:    Headache  This is a new problem. The current episode started 1 to 4 weeks ago (one week ago). The problem occurs intermittently. The problem has been gradually worsening. The pain is located in the left unilateral and temporal region. The pain does not radiate. The pain quality is similar to prior headaches. The quality of the pain is described as aching. The pain is at a severity of 10/10. The pain is severe. Associated symptoms include ear pain and sinus pressure. Pertinent negatives include no abdominal pain, blurred vision, coughing, dizziness, eye pain, eye watering, fever, nausea, numbness, rhinorrhea, sore throat (scratchy), tingling, tinnitus or vomiting. Associated symptoms comments: Over night and this morning it was worst. . Nothing aggravates the symptoms. Treatments tried: She took Claritin because it helped last night but it didn't today. Ultram. The treatment provided no relief.  She states this is how her sinus infections always start. She has been seen Dr. Kathyrn Sheriff in the past for these and he normally gives her an antibiotic and they clear right up.     Allergies  Allergen Reactions  . Nsaids Other (See Comments)    History of gastric ulcer  . Amoxicillin Nausea Only  . Ibandronic Acid Other (See Comments)    Muscle weakness - advised not to take  . Antihistamines, Chlorpheniramine-Type   . Aspirin     Gastric ulcer  . Actonel  [Risedronate] Other (See Comments)    Gastric ulcers  . Butalbital-Aspirin-Caffeine Other (See Comments)    Other reaction(s): Unknown  . Clarithromycin Other (See Comments)    Bloating Bloating Bloating  . Codeine   . Gatifloxacin Other (See Comments)  . Moxifloxacin   . Penicillins Other (See Comments)  .  Risedronate Sodium     Gastric ulcer  . Tylenol With Codeine #3  [Acetaminophen-Codeine]     Other reaction(s): Unknown  . Raloxifene Other (See Comments)    Bloating bloating   Current Meds  Medication Sig  . Calcium Carbonate-Vitamin D 600-400 MG-UNIT per tablet Take 1 tablet by mouth 2 (two) times daily.   Marland Kitchen dicyclomine (BENTYL) 10 MG capsule Take 1 capsule (10 mg total) by mouth 3 (three) times daily as needed for spasms.  Marland Kitchen doxepin (SINEQUAN) 10 MG capsule TAKE 1 CAPSULE BY MOUTH EVERY DAY.  Marland Kitchen doxepin (SINEQUAN) 50 MG capsule TAKE ONE (1) CAPSULE EACH DAY  . fluticasone (FLONASE) 50 MCG/ACT nasal spray Place 2 sprays into both nostrils daily.   . Fluticasone-Salmeterol (ADVAIR DISKUS) 100-50 MCG/DOSE AEPB Inhale 1 puff into the lungs 2 (two) times daily.  Marland Kitchen oxybutynin (DITROPAN XL) 15 MG 24 hr tablet Take 15 mg by mouth at bedtime.   . pentosan polysulfate (ELMIRON) 100 MG capsule Take 200 mg by mouth 2 (two) times daily.   Marland Kitchen ULTRAM 50 MG tablet TAKE ONE TO TWO TABLETS BY MOUTH AS NEEDED  . VENTOLIN HFA 108 (90 Base) MCG/ACT inhaler INHALE TWO PUFFS INTO THE LUNGS EVERY 4 HOURS AS NEEDED FOR WHEEZING OR SHORTNESS OF BREATH  . verapamil (CALAN-SR) 180 MG CR tablet     Review of Systems  Constitutional: Negative for fever, chills and fatigue.  HENT: Positive for ear pain, postnasal drip and sinus pressure. Negative  for rhinorrhea, sneezing, sore throat (scratchy), tinnitus and trouble swallowing.   Eyes: Negative for blurred vision, pain and visual disturbance.  Respiratory: Negative for cough, chest tightness and shortness of breath.   Cardiovascular: Negative for chest pain.  Gastrointestinal: Negative for nausea, vomiting and abdominal pain.  Neurological: Positive for headaches. Negative for dizziness, tingling and numbness.    Social History  Substance Use Topics  . Smoking status: Never Smoker   . Smokeless tobacco: Never Used  . Alcohol Use: No   Objective:   BP  120/78 mmHg  Pulse 69  Temp(Src) 98.2 F (36.8 C) (Oral)  Resp 16  Wt 117 lb 6.4 oz (53.252 kg)  Physical Exam  Constitutional: She appears well-developed and well-nourished. No distress.  HENT:  Head: Normocephalic and atraumatic.  Right Ear: Hearing, tympanic membrane, external ear and ear canal normal.  Left Ear: Hearing, tympanic membrane, external ear and ear canal normal.  Nose: Mucosal edema (left side; mild) present. No rhinorrhea. Right sinus exhibits maxillary sinus tenderness. Right sinus exhibits no frontal sinus tenderness. Left sinus exhibits maxillary sinus tenderness. Left sinus exhibits no frontal sinus tenderness.  Mouth/Throat: Uvula is midline, oropharynx is clear and moist and mucous membranes are normal. No oropharyngeal exudate, posterior oropharyngeal edema or posterior oropharyngeal erythema.  Neck: Normal range of motion. Neck supple. No tracheal deviation present. No thyromegaly present.  Cardiovascular: Normal rate, regular rhythm and normal heart sounds.  Exam reveals no gallop and no friction rub.   No murmur heard. Pulmonary/Chest: Effort normal. No stridor. No respiratory distress. She has wheezes (at end of inspiration). She has no rales.  Lymphadenopathy:    She has no cervical adenopathy.  Skin: She is not diaphoretic.  Vitals reviewed.     Assessment & Plan:     1. Acute maxillary sinusitis, recurrence not specified Worsening symptoms that has not responded to OTC medications. Will treat with zpak as below. She may continue claritin and flonase. She is to call the office if symptoms fail to improve.  - azithromycin (ZITHROMAX) 250 MG tablet; Take 2 tablets PO on day one, and one tablet PO daily thereafter until completed.  Dispense: 6 tablet; Refill: 1  2. Breast cancer screening Mammogram had not been scheduled from when she was seen for her CPE. Will reorder as below and f/u pending results once received. She goes to [employer]. - MM  Digital Screening; Future       Mar Daring, PA-C  [facility]

## 2016-06-05 NOTE — Patient Instructions (Addendum)
Potassium Content of Foods Potassium is a mineral found in many foods and drinks. It helps keep fluids and minerals balanced in your body and affects how steadily your heart beats. Potassium also helps control your blood pressure and keep your muscles and nervous system healthy. Certain health conditions and medicines may change the balance of potassium in your body. When this happens, you can help balance your level of potassium through the foods that you do or do not eat. Your health care provider or dietitian may recommend an amount of potassium that you should have each day. The following lists of foods provide the amount of potassium (in parentheses) per serving in each item. HIGH IN POTASSIUM  The following foods and beverages have 200 mg or more of potassium per serving:  Apricots, 2 raw or 5 dry (200 mg).  Artichoke, 1 medium (345 mg).  Avocado, raw,  each (245 mg).  Banana, 1 medium (425 mg).  Beans, lima, or baked beans, canned,  cup (280 mg).  Beans, white, canned,  cup (595 mg).  Beef roast, 3 oz (320 mg).  Beef, ground, 3 oz (270 mg).  Beets, raw or cooked,  cup (260 mg).  Bran muffin, 2 oz (300 mg).  Broccoli,  cup (230 mg).  Brussels sprouts,  cup (250 mg).  Cantaloupe,  cup (215 mg).  Cereal, 100% bran,  cup (200-400 mg).  Cheeseburger, single, fast food, 1 each (225-400 mg).  Chicken, 3 oz (220 mg).  Clams, canned, 3 oz (535 mg).  Crab, 3 oz (225 mg).  Dates, 5 each (270 mg).  Dried beans and peas,  cup (300-475 mg).  Figs, dried, 2 each (260 mg).  Fish: halibut, tuna, cod, snapper, 3 oz (480 mg).  Fish: salmon, haddock, swordfish, perch, 3 oz (300 mg).  Fish, tuna, canned 3 oz (200 mg).  French fries, fast food, 3 oz (470 mg).  Granola with fruit and nuts,  cup (200 mg).  Grapefruit juice,  cup (200 mg).  Greens, beet,  cup (655 mg).  Honeydew melon,  cup (200 mg).  Kale, raw, 1 cup (300 mg).  Kiwi, 1 medium (240  mg).  Kohlrabi, rutabaga, parsnips,  cup (280 mg).  Lentils,  cup (365 mg).  Mango, 1 each (325 mg).  Milk, chocolate, 1 cup (420 mg).  Milk: nonfat, low-fat, whole, buttermilk, 1 cup (350-380 mg).  Molasses, 1 Tbsp (295 mg).  Mushrooms,  cup (280) mg.  Nectarine, 1 each (275 mg).  Nuts: almonds, peanuts, hazelnuts, [place], cashew, mixed, 1 oz (200 mg).  Nuts, pistachios, 1 oz (295 mg).  Orange, 1 each (240 mg).  Orange juice,  cup (235 mg).  Papaya, medium,  fruit (390 mg).  Peanut butter, chunky, 2 Tbsp (240 mg).  Peanut butter, smooth, 2 Tbsp (210 mg).  Pear, 1 medium (200 mg).  Pomegranate, 1 whole (400 mg).  Pomegranate juice,  cup (215 mg).  Pork, 3 oz (350 mg).  Potato chips, salted, 1 oz (465 mg).  Potato, baked with skin, 1 medium (925 mg).  Potatoes, boiled,  cup (255 mg).  Potatoes, mashed,  cup (330 mg).  Prune juice,  cup (370 mg).  Prunes, 5 each (305 mg).  Pudding, chocolate,  cup (230 mg).  Pumpkin, canned,  cup (250 mg).  Raisins, seedless,  cup (270 mg).  Seeds, sunflower or pumpkin, 1 oz (240 mg).  Soy milk, 1 cup (300 mg).  Spinach,  cup (420 mg).  Spinach, canned,  cup (370 mg).    Sweet potato, baked with skin, 1 medium (450 mg).  Swiss chard,  cup (480 mg).  Tomato or vegetable juice,  cup (275 mg).  Tomato sauce or puree,  cup (400-550 mg).  Tomato, raw, 1 medium (290 mg).  Tomatoes, canned,  cup (200-300 mg).  Turkey, 3 oz (250 mg).  Wheat germ, 1 oz (250 mg).  Winter squash,  cup (250 mg).  Yogurt, plain or fruited, 6 oz (260-435 mg).  Zucchini,  cup (220 mg). MODERATE IN POTASSIUM The following foods and beverages have 50-200 mg of potassium per serving:  Apple, 1 each (150 mg).  Apple juice,  cup (150 mg).  Applesauce,  cup (90 mg).  Apricot nectar,  cup (140 mg).  Asparagus, small spears,  cup or 6 spears (155 mg).  Bagel, cinnamon raisin, 1 each (130 mg).  Bagel,  egg or plain, 4 in., 1 each (70 mg).  Beans, green,  cup (90 mg).  Beans, yellow,  cup (190 mg).  Beer, regular, 12 oz (100 mg).  Beets, canned,  cup (125 mg).  Blackberries,  cup (115 mg).  Blueberries,  cup (60 mg).  Bread, whole wheat, 1 slice (70 mg).  Broccoli, raw,  cup (145 mg).  Cabbage,  cup (150 mg).  Carrots, cooked or raw,  cup (180 mg).  Cauliflower, raw,  cup (150 mg).  Celery, raw,  cup (155 mg).  Cereal, bran flakes, cup (120-150 mg).  Cheese, cottage,  cup (110 mg).  Cherries, 10 each (150 mg).  Chocolate, 1 oz bar (165 mg).  Coffee, brewed 6 oz (90 mg).  Corn,  cup or 1 ear (195 mg).  Cucumbers,  cup (80 mg).  Egg, large, 1 each (60 mg).  Eggplant,  cup (60 mg).  Endive, raw, cup (80 mg).  English muffin, 1 each (65 mg).  Fish, orange roughy, 3 oz (150 mg).  Frankfurter, beef or pork, 1 each (75 mg).  Fruit cocktail,  cup (115 mg).  Grape juice,  cup (170 mg).  Grapefruit,  fruit (175 mg).  Grapes,  cup (155 mg).  Greens: kale, turnip, collard,  cup (110-150 mg).  Ice cream or frozen yogurt, chocolate,  cup (175 mg).  Ice cream or frozen yogurt, vanilla,  cup (120-150 mg).  Lemons, limes, 1 each (80 mg).  Lettuce, all types, 1 cup (100 mg).  Mixed vegetables,  cup (150 mg).  Mushrooms, raw,  cup (110 mg).  Nuts: walnuts, pecans, or macadamia, 1 oz (125 mg).  Oatmeal,  cup (80 mg).  Okra,  cup (110 mg).  Onions, raw,  cup (120 mg).  Peach, 1 each (185 mg).  Peaches, canned,  cup (120 mg).  Pears, canned,  cup (120 mg).  Peas, green, frozen,  cup (90 mg).  Peppers, green,  cup (130 mg).  Peppers, red,  cup (160 mg).  Pineapple juice,  cup (165 mg).  Pineapple, fresh or canned,  cup (100 mg).  Plums, 1 each (105 mg).  Pudding, vanilla,  cup (150 mg).  Raspberries,  cup (90 mg).  Rhubarb,  cup (115 mg).  Rice, wild,  cup (80 mg).  Shrimp, 3 oz (155  mg).  Spinach, raw, 1 cup (170 mg).  Strawberries,  cup (125 mg).  Summer squash  cup (175-200 mg).  Swiss chard, raw, 1 cup (135 mg).  Tangerines, 1 each (140 mg).  Tea, brewed, 6 oz (65 mg).  Turnips,  cup (140 mg).  Watermelon,  cup (85 mg).  Wine, red, table,   5 oz (180 mg).  Wine, white, table, 5 oz (100 mg). LOW IN POTASSIUM The following foods and beverages have less than 50 mg of potassium per serving.  Bread, white, 1 slice (30 mg).  Carbonated beverages, 12 oz (less than 5 mg).  Cheese, 1 oz (20-30 mg).  Cranberries,  cup (45 mg).  Cranberry juice cocktail,  cup (20 mg).  Fats and oils, 1 Tbsp (less than 5 mg).  Hummus, 1 Tbsp (32 mg).  Nectar: papaya, mango, or pear,  cup (35 mg).  Rice, white or brown,  cup (50 mg).  Spaghetti or macaroni,  cup cooked (30 mg).  Tortilla, flour or corn, 1 each (50 mg).  Waffle, 4 in., 1 each (50 mg).  Water chestnuts,  cup (40 mg).   This information is not intended to replace advice given to you by your health care provider. Make sure you discuss any questions you have with your health care provider.   Document Released: 07/16/2005 Document Revised: 12/07/2013 Document Reviewed: 10/29/2013 Elsevier Interactive Patient Education 2016 Elsevier Inc.   Sinusitis, Adult Sinusitis is redness, soreness, and inflammation of the paranasal sinuses. Paranasal sinuses are air pockets within the bones of your face. They are located beneath your eyes, in the middle of your forehead, and above your eyes. In healthy paranasal sinuses, mucus is able to drain out, and air is able to circulate through them by way of your nose. However, when your paranasal sinuses are inflamed, mucus and air can become trapped. This can allow bacteria and other germs to grow and cause infection. Sinusitis can develop quickly and last only a short time (acute) or continue over a long period (chronic). Sinusitis that lasts for more than 12  weeks is considered chronic. CAUSES Causes of sinusitis include:  Allergies.  Structural abnormalities, such as displacement of the cartilage that separates your nostrils (deviated septum), which can decrease the air flow through your nose and sinuses and affect sinus drainage.  Functional abnormalities, such as when the small hairs (cilia) that line your sinuses and help remove mucus do not work properly or are not present. SIGNS AND SYMPTOMS Symptoms of acute and chronic sinusitis are the same. The primary symptoms are pain and pressure around the affected sinuses. Other symptoms include:  Upper toothache.  Earache.  Headache.  Bad breath.  Decreased sense of smell and taste.  A cough, which worsens when you are lying flat.  Fatigue.  Fever.  Thick drainage from your nose, which often is green and may contain pus (purulent).  Swelling and warmth over the affected sinuses. DIAGNOSIS Your health care provider will perform a physical exam. During your exam, your health care provider may perform any of the following to help determine if you have acute sinusitis or chronic sinusitis:  Look in your nose for signs of abnormal growths in your nostrils (nasal polyps).  Tap over the affected sinus to check for signs of infection.  View the inside of your sinuses using an imaging device that has a light attached (endoscope). If your health care provider suspects that you have chronic sinusitis, one or more of the following tests may be recommended:  Allergy tests.  Nasal culture. A sample of mucus is taken from your nose, sent to a lab, and screened for bacteria.  Nasal cytology. A sample of mucus is taken from your nose and examined by your health care provider to determine if your sinusitis is related to an allergy. TREATMENT Most cases of acute  sinusitis are related to a viral infection and will resolve on their own within 10 days. Sometimes, medicines are prescribed to help  relieve symptoms of both acute and chronic sinusitis. These may include pain medicines, decongestants, nasal steroid sprays, or saline sprays. However, for sinusitis related to a bacterial infection, your health care provider will prescribe antibiotic medicines. These are medicines that will help kill the bacteria causing the infection. Rarely, sinusitis is caused by a fungal infection. In these cases, your health care provider will prescribe antifungal medicine. For some cases of chronic sinusitis, surgery is needed. Generally, these are cases in which sinusitis recurs more than 3 times per year, despite other treatments. HOME CARE INSTRUCTIONS  Drink plenty of water. Water helps thin the mucus so your sinuses can drain more easily.  Use a humidifier.  Inhale steam 3-4 times a day (for example, sit in the bathroom with the shower running).  Apply a warm, moist washcloth to your face 3-4 times a day, or as directed by your health care provider.  Use saline nasal sprays to help moisten and clean your sinuses.  Take medicines only as directed by your health care provider.  If you were prescribed either an antibiotic or antifungal medicine, finish it all even if you start to feel better. SEEK IMMEDIATE MEDICAL CARE IF:  You have increasing pain or severe headaches.  You have nausea, vomiting, or drowsiness.  You have swelling around your face.  You have vision problems.  You have a stiff neck.  You have difficulty breathing.   This information is not intended to replace advice given to you by your health care provider. Make sure you discuss any questions you have with your health care provider.   Document Released: 12/02/2005 Document Revised: 12/23/2014 Document Reviewed: 12/17/2011 Elsevier Interactive Patient Education [employer].

## 2016-06-14 ENCOUNTER — Telehealth: Payer: Self-pay | Admitting: Emergency Medicine

## 2016-06-14 NOTE — Telephone Encounter (Signed)
No with [facility] fills out a form and faxes it to them and then they call the patient to schedule.

## 2016-06-14 NOTE — Telephone Encounter (Signed)
Pt calling about her mammogram. She says it was suppose to be scheduled at [facility] and she has not heard back. Thanks.

## 2016-06-14 NOTE — Telephone Encounter (Signed)
Jackie Turner please review-aa

## 2016-06-14 NOTE — Telephone Encounter (Signed)
Patients are the one's to schedule their appointment right? I can call to schedule if that's ok.  Thanks,  -Archita Lomeli

## 2016-06-19 NOTE — Telephone Encounter (Signed)
I spoke with pt to give her contact information for [facility].She states that she has it and will contact them to schedule mammogram

## 2016-07-05 ENCOUNTER — Other Ambulatory Visit: Payer: Self-pay | Admitting: Family Medicine

## 2016-07-19 DIAGNOSIS — M4806 Spinal stenosis, lumbar region: Secondary | ICD-10-CM | POA: Diagnosis not present

## 2016-08-07 ENCOUNTER — Other Ambulatory Visit: Payer: Self-pay | Admitting: Family Medicine

## 2016-08-07 NOTE — Telephone Encounter (Signed)
Had wellness with you in May. Renaldo Fiddler, CMA

## 2016-08-15 DIAGNOSIS — M4806 Spinal stenosis, lumbar region: Secondary | ICD-10-CM | POA: Diagnosis not present

## 2016-09-13 ENCOUNTER — Encounter: Payer: Self-pay | Admitting: Physician Assistant

## 2016-09-13 ENCOUNTER — Ambulatory Visit (INDEPENDENT_AMBULATORY_CARE_PROVIDER_SITE_OTHER): Payer: Medicare HMO | Admitting: Physician Assistant

## 2016-09-13 ENCOUNTER — Telehealth: Payer: Self-pay

## 2016-09-13 VITALS — BP 130/78 | HR 60 | Temp 98.3°F | Resp 16 | Wt 119.8 lb

## 2016-09-13 DIAGNOSIS — J4531 Mild persistent asthma with (acute) exacerbation: Secondary | ICD-10-CM

## 2016-09-13 DIAGNOSIS — M810 Age-related osteoporosis without current pathological fracture: Secondary | ICD-10-CM

## 2016-09-13 MED ORDER — NYSTATIN 100000 UNIT/ML MT SUSP: 5.0000 mL | Freq: Two times a day (BID) | OROMUCOSAL | 1 refills | Status: DC

## 2016-09-13 MED ORDER — PREDNISONE 10 MG PO TABS: ORAL_TABLET | ORAL | 0 refills | Status: DC

## 2016-09-13 MED ORDER — ULTRAM 50 MG PO TABS: ORAL_TABLET | ORAL | 5 refills | Status: DC

## 2016-09-13 NOTE — Patient Instructions (Signed)
Asthma, Adult Asthma is a recurring condition in which the airways tighten and narrow. Asthma can make it difficult to breathe. It can cause coughing, wheezing, and shortness of breath. Asthma episodes, also called asthma attacks, range from minor to life-threatening. Asthma cannot be cured, but medicines and lifestyle changes can help control it. CAUSES Asthma is believed to be caused by inherited (genetic) and environmental factors, but its exact cause is unknown. Asthma may be triggered by allergens, lung infections, or irritants in the air. Asthma triggers are different for each person. Common triggers include:   Animal dander.  Dust mites.  Cockroaches.  Pollen from trees or grass.  Mold.  Smoke.  Air pollutants such as dust, household cleaners, hair sprays, aerosol sprays, paint fumes, strong chemicals, or strong odors.  Cold air, weather changes, and winds (which increase molds and pollens in the air).  Strong emotional expressions such as crying or laughing hard.  Stress.  Certain medicines (such as aspirin) or types of drugs (such as beta-blockers).  Sulfites in foods and drinks. Foods and drinks that may contain sulfites include dried fruit, potato chips, and sparkling grape juice.  Infections or inflammatory conditions such as the flu, a cold, or an inflammation of the nasal membranes (rhinitis).  Gastroesophageal reflux disease (GERD).  Exercise or strenuous activity. SYMPTOMS Symptoms may occur immediately after asthma is triggered or many hours later. Symptoms include:  Wheezing.  Excessive nighttime or early morning coughing.  Frequent or severe coughing with a common cold.  Chest tightness.  Shortness of breath. DIAGNOSIS  The diagnosis of asthma is made by a review of your medical history and a physical exam. Tests may also be performed. These may include:  Lung function studies. These tests show how much air you breathe in and out.  Allergy  tests.  Imaging tests such as X-rays. TREATMENT  Asthma cannot be cured, but it can usually be controlled. Treatment involves identifying and avoiding your asthma triggers. It also involves medicines. There are 2 classes of medicine used for asthma treatment:   Controller medicines. These prevent asthma symptoms from occurring. They are usually taken every day.  Reliever or rescue medicines. These quickly relieve asthma symptoms. They are used as needed and provide short-term relief. Your health care provider will help you create an asthma action plan. An asthma action plan is a written plan for managing and treating your asthma attacks. It includes a list of your asthma triggers and how they may be avoided. It also includes information on when medicines should be taken and when their dosage should be changed. An action plan may also involve the use of a device called a peak flow meter. A peak flow meter measures how well the lungs are working. It helps you monitor your condition. HOME CARE INSTRUCTIONS   Take medicines only as directed by your health care provider. Speak with your health care provider if you have questions about how or when to take the medicines.  Use a peak flow meter as directed by your health care provider. Record and keep track of readings.  Understand and use the action plan to help minimize or stop an asthma attack without needing to seek medical care.  Control your home environment in the following ways to help prevent asthma attacks:  Do not smoke. Avoid being exposed to secondhand smoke.  Change your heating and air conditioning filter regularly.  Limit your use of fireplaces and wood stoves.  Get rid of pests (such as roaches   and mice) and their droppings.  Throw away plants if you see mold on them.  Clean your floors and dust regularly. Use unscented cleaning products.  Try to have someone else vacuum for you regularly. Stay out of rooms while they are  being vacuumed and for a short while afterward. If you vacuum, use a dust mask from a hardware store, a double-layered or microfilter vacuum cleaner bag, or a vacuum cleaner with a HEPA filter.  Replace carpet with wood, tile, or vinyl flooring. Carpet can trap dander and dust.  Use allergy-proof pillows, mattress covers, and box spring covers.  Wash bed sheets and blankets every week in hot water and dry them in a dryer.  Use blankets that are made of polyester or cotton.  Clean bathrooms and kitchens with bleach. If possible, have someone repaint the walls in these rooms with mold-resistant paint. Keep out of the rooms that are being cleaned and painted.  Wash hands frequently. SEEK MEDICAL CARE IF:   You have wheezing, shortness of breath, or a cough even if taking medicine to prevent attacks.  The colored mucus you cough up (sputum) is thicker than usual.  Your sputum changes from clear or white to yellow, green, gray, or bloody.  You have any problems that may be related to the medicines you are taking (such as a rash, itching, swelling, or trouble breathing).  You are using a reliever medicine more than 2-3 times per week.  Your peak flow is still at 50-79% of your personal best after following your action plan for 1 hour.  You have a fever. SEEK IMMEDIATE MEDICAL CARE IF:   You seem to be getting worse and are unresponsive to treatment during an asthma attack.  You are short of breath even at rest.  You get short of breath when doing very little physical activity.  You have difficulty eating, drinking, or talking due to asthma symptoms.  You develop chest pain.  You develop a fast heartbeat.  You have a bluish color to your lips or fingernails.  You are light-headed, dizzy, or faint.  Your peak flow is less than 50% of your personal best.   This information is not intended to replace advice given to you by your health care provider. Make sure you discuss any  questions you have with your health care provider.   Document Released: 12/02/2005 Document Revised: 08/23/2015 Document Reviewed: 07/01/2013 Elsevier Interactive Patient Education 2016 Elsevier Inc.  

## 2016-09-13 NOTE — Telephone Encounter (Signed)
Prescription for Ultram was called into [facility].  Thanks,  -Brihanna Devenport

## 2016-09-13 NOTE — Telephone Encounter (Signed)
Nystatin refilled.

## 2016-09-13 NOTE — Telephone Encounter (Signed)
Ok to refill? Jackie Turner, CMA  

## 2016-09-13 NOTE — Telephone Encounter (Signed)
Patient called wanting to get a refill of the nystatin suspension. She reports that she gets thrush while on prednisone, and this usually helps with preventing it. Patient uses [facility]. Thanks!

## 2016-09-13 NOTE — Progress Notes (Signed)
Patient: Jackie Turner Female    DOB: [birth year]   80 y.o.   MRN: [id number] Visit Date: 09/13/2016  Today's Provider: Mar Daring, PA-C   Chief Complaint  Patient presents with  . Shortness of Breath   Subjective:    HPI SOB: patient reports that she only has SOB and cough with postnasal drip. Patient denies chest pain, palpitations,wheezing, chest tightness, sinus pressure,runny nose, fatigue " I came up the stairs with no problems", fever, or sore throat. Treatments tried: Inhaler and increased fluids. She reports she was worse yesterday and today feels she is not having much issue. She is currently on Advair 100-50 and ventolin prn.    Allergies  Allergen Reactions  . Nsaids Other (See Comments)    History of gastric ulcer  . Amoxicillin Nausea Only and Rash  . Ibandronic Acid Other (See Comments)    Muscle weakness - advised not to take  . Actonel  [Risedronate] Other (See Comments)    Gastric ulcers  . Antihistamines, Chlorpheniramine-Type   . Aspirin     Gastric ulcer  . Butalbital-Aspirin-Caffeine Other (See Comments)    Other reaction(s): Unknown  . Clarithromycin Other (See Comments)    Bloating Bloating Bloating  . Codeine   . Gatifloxacin Other (See Comments)  . Moxifloxacin   . Penicillins Other (See Comments)  . Risedronate Sodium     Gastric ulcer  . Tylenol With Codeine #3  [Acetaminophen-Codeine]     Other reaction(s): Unknown  . Raloxifene Other (See Comments)    Bloating bloating     Current Outpatient Prescriptions:  .  Calcium Carbonate-Vitamin D 600-400 MG-UNIT per tablet, Take 1 tablet by mouth 2 (two) times daily. , Disp: , Rfl:  .  dicyclomine (BENTYL) 10 MG capsule, Take 1 capsule (10 mg total) by mouth 3 (three) times daily as needed for spasms., Disp: 120 capsule, Rfl: 5 .  doxepin (SINEQUAN) 50 MG capsule, TAKE ONE (1) CAPSULE EACH DAY, Disp: 30 capsule, Rfl: 5 .  fluticasone (FLONASE) 50 MCG/ACT nasal spray,  Place 2 sprays into both nostrils daily. , Disp: , Rfl:  .  Fluticasone-Salmeterol (ADVAIR DISKUS) 100-50 MCG/DOSE AEPB, Inhale 1 puff into the lungs 2 (two) times daily., Disp: 60 each, Rfl: 5 .  montelukast (SINGULAIR) 10 MG tablet, Take 1 tablet (10 mg total) by mouth at bedtime., Disp: 30 tablet, Rfl: 6 .  nystatin (MYCOSTATIN) 100000 UNIT/ML suspension, , Disp: , Rfl:  .  oxybutynin (DITROPAN XL) 15 MG 24 hr tablet, Take 15 mg by mouth at bedtime. , Disp: , Rfl:  .  pentosan polysulfate (ELMIRON) 100 MG capsule, Take 200 mg by mouth 2 (two) times daily. , Disp: , Rfl:  .  ULTRAM 50 MG tablet, TAKE ONE TO TWO TABLETS BY MOUTH AS NEEDED, Disp: 60 tablet, Rfl: 5 .  VENTOLIN HFA 108 (90 Base) MCG/ACT inhaler, INHALE TWO PUFFS INTO THE LUNGS EVERY 4 HOURS AS NEEDED FOR WHEEZING OR SHORTNESS OF BREATH, Disp: 18 g, Rfl: 3 .  verapamil (CALAN-SR) 180 MG CR tablet, TAKE 1/2 TABLET BY MOUTH EVERY DAY, Disp: 45 tablet, Rfl: 2  Review of Systems  Constitutional: Negative.   HENT: Positive for postnasal drip. Negative for congestion, rhinorrhea, sinus pressure, sneezing and sore throat.   Eyes: Negative.   Respiratory: Positive for shortness of breath. Negative for chest tightness and wheezing.   Cardiovascular: Negative for chest pain, palpitations and leg swelling.  Gastrointestinal: Negative.   Neurological:  Negative for dizziness, light-headedness and headaches.    Social History  Substance Use Topics  . Smoking status: Never Smoker  . Smokeless tobacco: Never Used  . Alcohol use No   Objective:   BP 130/78 (BP Location: Right Arm, Patient Position: Sitting, Cuff Size: Normal)   Pulse 60   Temp 98.3 F (36.8 C) (Oral)   Resp 16   Wt 119 lb 12.8 oz (54.3 kg)   SpO2 96%   BMI 22.64 kg/m   Physical Exam  Constitutional: She appears well-developed and well-nourished. No distress.  HENT:  Head: Normocephalic and atraumatic.  Right Ear: Hearing, tympanic membrane, external ear and ear  canal normal.  Left Ear: Hearing, tympanic membrane, external ear and ear canal normal.  Nose: Nose normal.  Mouth/Throat: Uvula is midline, oropharynx is clear and moist and mucous membranes are normal. No oropharyngeal exudate, posterior oropharyngeal edema or posterior oropharyngeal erythema.  Eyes: Conjunctivae are normal. Pupils are equal, round, and reactive to light. Right eye exhibits no discharge. Left eye exhibits no discharge. No scleral icterus.  Neck: Normal range of motion. Neck supple. No tracheal deviation present. No thyromegaly present.  Cardiovascular: Normal rate, regular rhythm and normal heart sounds.  Exam reveals no gallop and no friction rub.   No murmur heard. Pulmonary/Chest: Effort normal. No stridor. No respiratory distress. She has no decreased breath sounds. She has wheezes in the right upper field, the right middle field, the left upper field and the left middle field. She has no rhonchi. She has no rales.  Lymphadenopathy:    She has no cervical adenopathy.  Skin: Skin is warm and dry. She is not diaphoretic.  Vitals reviewed.     Assessment & Plan:     1. Asthma with allergic rhinitis, mild persistent, with acute exacerbation Worsening and not responding to inhalers. No current infectious signs. Most likely exacerbation from allergic post nasal drainage. Will give prednisone as below for exacerbation. I have also given her 2 samples of Advair 250-50 to see if she notices a better response with this medication. She will call if it works better and we will increase Advair prescription. - predniSONE (DELTASONE) 10 MG tablet; Take 6 tabs PO on day 1&2, 5 tabs PO on day 3&4, 4 tabs PO on day 5&6, 3 tabs PO on day 7&8, 2 tabs PO on day 9&10, 1 tab PO on day 11&12.  Dispense: 42 tablet; Refill: 0  2. Osteoporosis Stable. Diagnosis pulled for medication refill. Continue current medical treatment plan. - ULTRAM 50 MG tablet; TAKE ONE TO TWO TABLETS BY MOUTH AS NEEDED   Dispense: 60 tablet; Refill: Myton, PA-C  [facility]

## 2016-09-19 ENCOUNTER — Other Ambulatory Visit: Payer: Self-pay | Admitting: Physician Assistant

## 2016-09-19 NOTE — Telephone Encounter (Signed)
Per patient she takes a 50 Mg and a 10 Mg to make a total of 60.  Thanks,  -Joseline

## 2016-09-19 NOTE — Telephone Encounter (Signed)
We have her on 50mg  not 10mg . Can we verify dose please?  Thanks.

## 2016-09-21 DIAGNOSIS — S92911A Unspecified fracture of right toe(s), initial encounter for closed fracture: Secondary | ICD-10-CM | POA: Diagnosis not present

## 2016-09-23 ENCOUNTER — Telehealth: Payer: Self-pay | Admitting: Physician Assistant

## 2016-09-23 NOTE — Telephone Encounter (Signed)
Jackie Turner, if there is samples please give to patient Thanks ED

## 2016-09-23 NOTE — Telephone Encounter (Signed)
Pt states she seen Dr Rosanna Randy yesterday for a personal reason and Dr Rosanna Randy advised pt to call in today and request a sample for Spiriva for her breathing.  CB#[phone]/MW

## 2016-09-23 NOTE — Telephone Encounter (Signed)
Per Jiles Garter okay to give Spiriva inhaler, we only had one in office dosage in office for this inhaler which was 1.87mcg. Informed patient that inhaler is up front for pick up. KW

## 2016-09-23 NOTE — Telephone Encounter (Signed)
Okay to leave sample at front desk? KW

## 2016-10-08 DIAGNOSIS — Z1231 Encounter for screening mammogram for malignant neoplasm of breast: Secondary | ICD-10-CM | POA: Diagnosis not present

## 2016-10-11 ENCOUNTER — Encounter: Payer: Self-pay | Admitting: Physician Assistant

## 2016-10-17 ENCOUNTER — Telehealth: Payer: Self-pay | Admitting: Physician Assistant

## 2016-10-17 NOTE — Telephone Encounter (Signed)
Please review

## 2016-10-17 NOTE — Telephone Encounter (Signed)
Pt is requesting samples or a Rx for Fluticasone-Salmeterol (ADVAIR DISKUS) 100-50 MCG/DOSE AEPB and Spiriva respimat 1.25 MCG.  [facility].  CB#[phone]/MW

## 2016-10-17 NOTE — Telephone Encounter (Signed)
Patient has been notified samples have been left at front desk for pick up. KW

## 2016-10-17 NOTE — Telephone Encounter (Signed)
Ok to give patient a couple samples if we have them.

## 2016-10-21 ENCOUNTER — Ambulatory Visit (INDEPENDENT_AMBULATORY_CARE_PROVIDER_SITE_OTHER): Payer: Medicare HMO | Admitting: Physician Assistant

## 2016-10-21 ENCOUNTER — Encounter: Payer: Self-pay | Admitting: Physician Assistant

## 2016-10-21 VITALS — BP 80/60 | HR 93 | Temp 98.3°F | Resp 16 | Wt 121.0 lb

## 2016-10-21 DIAGNOSIS — Z1211 Encounter for screening for malignant neoplasm of colon: Secondary | ICD-10-CM | POA: Diagnosis not present

## 2016-10-21 DIAGNOSIS — Z23 Encounter for immunization: Secondary | ICD-10-CM | POA: Diagnosis not present

## 2016-10-21 DIAGNOSIS — J4540 Moderate persistent asthma, uncomplicated: Secondary | ICD-10-CM | POA: Diagnosis not present

## 2016-10-21 DIAGNOSIS — G44209 Tension-type headache, unspecified, not intractable: Secondary | ICD-10-CM | POA: Diagnosis not present

## 2016-10-21 MED ORDER — FLUTICASONE-SALMETEROL 250-50 MCG/DOSE IN AEPB: 1.0000 | INHALATION_SPRAY | Freq: Two times a day (BID) | RESPIRATORY_TRACT | 5 refills | Status: DC

## 2016-10-21 MED ORDER — TIOTROPIUM BROMIDE MONOHYDRATE 1.25 MCG/ACT IN AERS: 1.2500 ug | INHALATION_SPRAY | Freq: Two times a day (BID) | RESPIRATORY_TRACT | 3 refills | Status: DC

## 2016-10-21 NOTE — Patient Instructions (Signed)
Sinus Headache A sinus headache occurs when the paranasal sinuses become clogged or swollen. Paranasal sinuses are air pockets within the bones of the face. Sinus headaches can range from mild to severe. CAUSES A sinus headache can result from various conditions that affect the sinuses, such as:  Colds.  Sinus infections.  Allergies. SYMPTOMS The main symptom of this condition is a headache that may feel like pain or pressure in the face, forehead, ears, or upper teeth. People who have a sinus headache often have other symptoms, such as:  Congested or runny nose.  Fever.  Inability to smell. Weather changes can make symptoms worse. DIAGNOSIS This condition may be diagnosed based on:  A physical exam and medical history.  Imaging tests, such as a CT scan and MRI, to check for problems with the sinuses.  A specialist may look into the sinuses with a tool that has a camera (endoscopy). TREATMENT Treatment for this condition depends on the cause.  Sinus pain that is caused by a sinus infection may be treated with antibiotic medicine.  Sinus pain that is caused by allergies may be helped by allergy medicines (antihistamines) and medicated nasal sprays.  Sinus pain that is caused by congestion may be helped by flushing the nose and sinuses with saline solution. HOME CARE INSTRUCTIONS  Take medicines only as directed by your health care provider.  If you were prescribed an antibiotic medicine, finish all of it even if you start to feel better.  If you have congestion, use a nasal spray to help reduce pressure.  If directed, apply a warm, moist washcloth to your face to help relieve pain. SEEK MEDICAL CARE IF:  You have headaches more than one time each week.  You have sensitivity to light or sound.  You have a fever.  You feel sick to your stomach (nauseous) or you throw up (vomit).  Your headaches do not get better with treatment. Many people think that they have a  sinus headache when they actually have migraines or tension headaches. SEEK IMMEDIATE MEDICAL CARE IF:  You have vision problems.  You have sudden, severe pain in your face or head.  You have a seizure.  You are confused.  You have a stiff neck.   This information is not intended to replace advice given to you by your health care provider. Make sure you discuss any questions you have with your health care provider.   Document Released: 01/09/2005 Document Revised: 04/18/2015 Document Reviewed: 11/28/2014 Elsevier Interactive Patient Education [employer].

## 2016-10-21 NOTE — Progress Notes (Signed)
Patient: Jackie Turner Female    DOB: 12-Aug-1936   80 y.o.   MRN: [id number] Visit Date: 10/21/2016  Today's Provider: Mar Daring, PA-C   Chief Complaint  Patient presents with  . Headache   Subjective:    Headache   This is a new problem. The current episode started in the past 7 days. The problem occurs constantly. The pain is located in the frontal region. The pain does not radiate. The pain quality is not similar to prior headaches. The quality of the pain is described as aching and dull. Associated symptoms include blurred vision and eye redness (a little on the left eye). Pertinent negatives include no abdominal pain, coughing, dizziness, ear pain, nausea, rhinorrhea, sinus pressure or tinnitus. Nothing aggravates the symptoms. She has tried nothing for the symptoms.   She reports it is fairly similar to her sinus headaches that she gets over her left frontal lobe but that this one is more central this time. She does report she is also having blurred vision and has not seen her ophthalmologist recently. She states that she had cataract surgery and has not had any eye exam since having the postop examination.     Allergies  Allergen Reactions  . Nsaids Other (See Comments)    History of gastric ulcer  . Amoxicillin Nausea Only and Rash  . Ibandronic Acid Other (See Comments)    Muscle weakness - advised not to take  . Actonel  [Risedronate] Other (See Comments)    Gastric ulcers  . Antihistamines, Chlorpheniramine-Type   . Aspirin     Gastric ulcer  . Butalbital-Aspirin-Caffeine Other (See Comments)    Other reaction(s): Unknown  . Clarithromycin Other (See Comments)    Bloating Bloating Bloating  . Codeine   . Gatifloxacin Other (See Comments)  . Moxifloxacin   . Penicillins Other (See Comments)  . Risedronate Sodium     Gastric ulcer  . Tylenol With Codeine #3  [Acetaminophen-Codeine]     Other reaction(s): Unknown  . Raloxifene Other (See  Comments)    Bloating bloating     Current Outpatient Prescriptions:  .  Calcium Carbonate-Vitamin D 600-400 MG-UNIT per tablet, Take 1 tablet by mouth 2 (two) times daily. , Disp: , Rfl:  .  dicyclomine (BENTYL) 10 MG capsule, Take 1 capsule (10 mg total) by mouth 3 (three) times daily as needed for spasms., Disp: 120 capsule, Rfl: 5 .  doxepin (SINEQUAN) 10 MG capsule, TAKE 1 CAPSULE BY MOUTH EVERY DAY., Disp: 90 capsule, Rfl: 1 .  doxepin (SINEQUAN) 50 MG capsule, TAKE ONE (1) CAPSULE EACH DAY, Disp: 30 capsule, Rfl: 5 .  fluticasone (FLONASE) 50 MCG/ACT nasal spray, Place 2 sprays into both nostrils daily. , Disp: , Rfl:  .  Fluticasone-Salmeterol (ADVAIR DISKUS) 100-50 MCG/DOSE AEPB, Inhale 1 puff into the lungs 2 (two) times daily., Disp: 60 each, Rfl: 5 .  montelukast (SINGULAIR) 10 MG tablet, Take 1 tablet (10 mg total) by mouth at bedtime., Disp: 30 tablet, Rfl: 6 .  oxybutynin (DITROPAN XL) 15 MG 24 hr tablet, Take 15 mg by mouth at bedtime. , Disp: , Rfl:  .  pentosan polysulfate (ELMIRON) 100 MG capsule, Take 200 mg by mouth 2 (two) times daily. , Disp: , Rfl:  .  ULTRAM 50 MG tablet, TAKE ONE TO TWO TABLETS BY MOUTH AS NEEDED, Disp: 60 tablet, Rfl: 5 .  VENTOLIN HFA 108 (90 Base) MCG/ACT inhaler, INHALE TWO PUFFS INTO  THE LUNGS EVERY 4 HOURS AS NEEDED FOR WHEEZING OR SHORTNESS OF BREATH, Disp: 18 g, Rfl: 3 .  verapamil (CALAN-SR) 180 MG CR tablet, TAKE 1/2 TABLET BY MOUTH EVERY DAY, Disp: 45 tablet, Rfl: 2 .  nystatin (MYCOSTATIN) 100000 UNIT/ML suspension, Use as directed 5 mLs (500,000 Units total) in the mouth or throat 2 (two) times daily. (Patient not taking: Reported on 10/21/2016), Disp: 60 mL, Rfl: 1 .  predniSONE (DELTASONE) 10 MG tablet, Take 6 tabs PO on day 1&2, 5 tabs PO on day 3&4, 4 tabs PO on day 5&6, 3 tabs PO on day 7&8, 2 tabs PO on day 9&10, 1 tab PO on day 11&12. (Patient not taking: Reported on 10/21/2016), Disp: 42 tablet, Rfl: 0  Review of Systems    Constitutional: Negative.   HENT: Negative for congestion, ear discharge, ear pain, postnasal drip, rhinorrhea, sinus pain, sinus pressure and tinnitus.   Eyes: Positive for blurred vision and redness (a little on the left eye).  Respiratory: Negative for cough, chest tightness and shortness of breath.   Cardiovascular: Negative for chest pain, palpitations and leg swelling.  Gastrointestinal: Negative for abdominal pain and nausea.  Neurological: Positive for headaches. Negative for dizziness.    Social History  Substance Use Topics  . Smoking status: Never Smoker  . Smokeless tobacco: Never Used  . Alcohol use No   Objective:   BP (!) 80/40 (BP Location: Right Arm, Patient Position: Sitting, Cuff Size: Normal)   Pulse 93   Temp 98.3 F (36.8 C) (Oral)   Resp 16   Wt 121 lb (54.9 kg)   SpO2 93% Comment: 93-92%  BMI 22.86 kg/m   Physical Exam  Constitutional: She is oriented to person, place, and time. She appears well-developed and well-nourished. No distress.  Eyes: Conjunctivae and EOM are normal. Pupils are equal, round, and reactive to light. Right eye exhibits no discharge. Left eye exhibits no discharge.  Neck: Normal range of motion. Neck supple. No JVD present. No tracheal deviation present. No thyromegaly present.  Cardiovascular: Normal rate, regular rhythm and normal heart sounds.  Exam reveals no gallop and no friction rub.   No murmur heard. Pulmonary/Chest: Effort normal and breath sounds normal. No respiratory distress. She has no wheezes. She has no rales.  Musculoskeletal: She exhibits no edema.  Lymphadenopathy:    She has no cervical adenopathy.  Neurological: She is alert and oriented to person, place, and time. No cranial nerve deficit. Coordination normal.  Skin: She is not diaphoretic.  Vitals reviewed.     Assessment & Plan:     1. Acute non intractable tension-type headache She describes this as a frontal headache sometimes feeling like a tight  band that is associated with blurred vision. Advised her to follow-up with her ophthalmologist for routine eye exam to make sure that her vision is not changing that could be causing the headache. She does report that she is able to control the headache with ibuprofen. She is also followed by Dr. Kathyrn Sheriff for chronic sinusitis. Continue ibuprofen for now and call if the headache worsens.  2. Moderate persistent asthma without complication Noticing symptom improvement with the increased dose of Advair 250/50 micrograms. Will refill as below. Also doing well with Spiriva.  - Fluticasone-Salmeterol (ADVAIR) 250-50 MCG/DOSE AEPB; Inhale 1 puff into the lungs 2 (two) times daily.  Dispense: 60 each; Refill: 5 - Tiotropium Bromide Monohydrate (SPIRIVA RESPIMAT) 1.25 MCG/ACT AERS; Inhale 1.25 mcg into the lungs 2 (two) times daily.  Dispense: 4 g; Refill: 3  3. Colon cancer screening Patient is due for colonoscopy but does not wish to have one at this time. She states that she would prefer: Guarded. This has been ordered as below. I will follow-up with her pending results. - Cologuard  4. Need for influenza vaccination Flu vaccine given today without complication. Patient sat upright for 15 minutes to check for adverse reaction before being released. - Flu vaccine HIGH DOSE PF (Fluzone High dose)       Mar Daring, PA-C  [facility]

## 2016-10-22 ENCOUNTER — Ambulatory Visit (INDEPENDENT_AMBULATORY_CARE_PROVIDER_SITE_OTHER): Payer: Medicare HMO

## 2016-10-22 ENCOUNTER — Ambulatory Visit
Admission: EM | Admit: 2016-10-22 | Discharge: 2016-10-22 | Disposition: A | Discharge status: Home / Self Care | Payer: Medicare HMO | Attending: Family Medicine | Admitting: Family Medicine

## 2016-10-22 DIAGNOSIS — S92511A Displaced fracture of proximal phalanx of right lesser toe(s), initial encounter for closed fracture: Secondary | ICD-10-CM | POA: Diagnosis not present

## 2016-10-22 DIAGNOSIS — S92501A Displaced unspecified fracture of right lesser toe(s), initial encounter for closed fracture: Secondary | ICD-10-CM

## 2016-10-22 NOTE — ED Provider Notes (Signed)
[facility] ____________________________________________  Time seen: Approximately 3:47 PM  I have reviewed the triage vital signs and the nursing notes.   HISTORY  Chief Complaint Toe Injury   HPI Jackie Turner is a 80 y.o. female presents with a complaint of right third toe pain. Patient reports last night she was walking through her living room and accidentally stubbed her toe on the chair. Patient reports pain has continued. Patient states mild pain at this time the pain primarily with direct palpation or weightbearing. Patient reports she has home Ultram as needed for pain and did take 1 today which resolve the pain at the time. Reports some bruising and swelling. Denies any pain radiation. Denies any numbness or tingling sensation. Denies fall to the ground. Denies head injury or loss of consciousness. Reports otherwise feels well.  Denies chest pain, shortness of breath, abdominal pain, dizziness, weakness or other complaints.  Mar Daring, PA-C: PCP   Past Medical History:  Diagnosis Date  . Allergy   . Asthma   . Hyperlipidemia   . Osteoporosis     Patient Active Problem List   Diagnosis Date Noted  . Recurrent URI (upper respiratory infection) 10/31/2015  . Hypoxia 09/27/2015  . Abnormal CXR 09/27/2015  . Asthma with allergic rhinitis 08/31/2015  . Back ache 08/31/2015  . Chronic interstitial cystitis 08/31/2015  . Grief reaction 08/31/2015  . Hypercholesteremia 06/26/2015  . Migraine 06/26/2015  . Asthma 06/26/2015  . IBS (irritable bowel syndrome) 06/26/2015  . Osteoporosis 06/26/2015  . Fatigue 06/26/2015  . Lumbar radiculopathy 04/27/2013    Past Surgical History:  Procedure Laterality Date  . ABDOMINAL HYSTERECTOMY  2003  . BREAST BIOPSY  1976, 1978   benign  . carpal tunnel repair Bilateral    R 1990; L 1997  . EYE SURGERY Bilateral    cataract extraction  . HAND TENDON SURGERY Right    Trigger finger  .  TONSILLECTOMY  1961    Current Outpatient Rx  . Order #: [id number] Class: Historical Med  . Order #: [id number] Class: Normal  . Order #: [id number] Class: Normal  . Order #: [id number] Class: Normal  . Order #: [id number] Class: Historical Med  . Order #: [id number] Class: Normal  . Order #: [id number] Class: Normal  . Order #: [id number] Class: Normal  . Order #: [id number] Class: Historical Med  . Order #: [id number] Class: Historical Med  . Order #: [id number] Class: Normal  . Order #: [id number] Class: Print  . Order #: [id number] Class: Normal    No current facility-administered medications for this encounter.   Current Outpatient Prescriptions:  .  Calcium Carbonate-Vitamin D 600-400 MG-UNIT per tablet, Take 1 tablet by mouth 2 (two) times daily. , Disp: , Rfl:  .  dicyclomine (BENTYL) 10 MG capsule, Take 1 capsule (10 mg total) by mouth 3 (three) times daily as needed for spasms., Disp: 120 capsule, Rfl: 5 .  doxepin (SINEQUAN) 10 MG capsule, TAKE 1 CAPSULE BY MOUTH EVERY DAY., Disp: 90 capsule, Rfl: 1 .  doxepin (SINEQUAN) 50 MG capsule, TAKE ONE (1) CAPSULE EACH DAY, Disp: 30 capsule, Rfl: 5 .  fluticasone (FLONASE) 50 MCG/ACT nasal spray, Place 2 sprays into both nostrils daily. , Disp: , Rfl:  .  Fluticasone-Salmeterol (ADVAIR) 250-50 MCG/DOSE AEPB, Inhale 1 puff into the lungs 2 (two) times daily., Disp: 60 each, Rfl: 5 .  montelukast (SINGULAIR) 10 MG tablet, Take 1 tablet (10 mg total) by mouth at bedtime., Disp: 30 tablet, Rfl: 6 .  nystatin (MYCOSTATIN) 100000 UNIT/ML suspension, Use  as directed 5 mLs (500,000 Units total) in the mouth or throat 2 (two) times daily., Disp: 60 mL, Rfl: 1 .  oxybutynin (DITROPAN XL) 15 MG 24 hr tablet, Take 15 mg by mouth at bedtime. , Disp: , Rfl:  .  pentosan polysulfate (ELMIRON) 100 MG capsule, Take 200 mg by mouth 2 (two) times daily. , Disp: , Rfl:  .  Tiotropium Bromide Monohydrate (SPIRIVA RESPIMAT) 1.25 MCG/ACT AERS, Inhale 1.25 mcg into the lungs 2  (two) times daily., Disp: 4 g, Rfl: 3 .  ULTRAM 50 MG tablet, TAKE ONE TO TWO TABLETS BY MOUTH AS NEEDED, Disp: 60 tablet, Rfl: 5 .  VENTOLIN HFA 108 (90 Base) MCG/ACT inhaler, INHALE TWO PUFFS INTO THE LUNGS EVERY 4 HOURS AS NEEDED FOR WHEEZING OR SHORTNESS OF BREATH, Disp: 18 g, Rfl: 3  Allergies Nsaids; Amoxicillin; Ibandronic acid; Actonel  [risedronate]; Antihistamines, chlorpheniramine-type; Aspirin; Butalbital-aspirin-caffeine; Clarithromycin; Codeine; Gatifloxacin; Moxifloxacin; Penicillins; Risedronate sodium; Tylenol with codeine #3  [acetaminophen-codeine]; and Raloxifene  Family History  Problem Relation Age of Onset  . Heart disease Mother   . CAD Mother   . Congestive Heart Failure Mother   . Osteoarthritis Mother   . Cancer Sister     breast  . Breast cancer Sister     Social History Social History  Substance Use Topics  . Smoking status: Never Smoker  . Smokeless tobacco: Never Used  . Alcohol use No    Review of Systems Constitutional: No fever/chills Eyes: No visual changes. ENT: No sore throat. Cardiovascular: Denies chest pain. Respiratory: Denies shortness of breath. Gastrointestinal: No abdominal pain.  No nausea, no vomiting.  No diarrhea.  No constipation. Genitourinary: Negative for dysuria. Musculoskeletal: Negative for back pain.As above. Skin: Negative for rash. Neurological: Negative for headaches, focal weakness or numbness.  10-point ROS otherwise negative.  ____________________________________________   PHYSICAL EXAM:  VITAL SIGNS: ED Triage Vitals  Enc Vitals Group     BP 10/22/16 1541 (!) 120/48     Pulse Rate 10/22/16 1541 (!) 108 Recheck 88     Resp 10/22/16 1541 18     Temp 10/22/16 1541 98 F (36.7 C)     Temp Source 10/22/16 1541 Oral     SpO2 10/22/16 1541 97 %     Weight 10/22/16 1541 121 lb (54.9 kg)     Height 10/22/16 1541 5\' 1"  (1.549 m)     Head Circumference --      Peak Flow --      Pain Score 10/22/16 1542 6       Pain Loc --      Pain Edu? --      Excl. in [place]? --     Constitutional: Alert and oriented. Well appearing and in no acute distress. Eyes: Conjunctivae are normal. PERRL. EOMI. ENT      Head: Normocephalic and atraumatic.      Mouth/Throat: Mucous membranes are moist. Neck: No stridor. Supple without meningismus. Marland Kitchen Respiratory: Normal respiratory effort without tachypnea nor retractions. Breath sounds are clear and equal bilaterally. No wheezes/rales/rhonchi.. Musculoskeletal:  Nontender with normal range of motion in all extremities. No midline cervical, thoracic or lumbar tenderness to palpation. Bilateral pedal pulses equal and easily palpated. Except: Right third proximal toe mild to moderate tenderness to direct palpation, mild swelling, mild ecchymosis, full range of motion, normal distal sensation and capillary refill, right foot otherwise nontender. Ambulatory with steady gait. Neurologic:  Normal speech and language. No gross focal neurologic deficits are appreciated. Speech  is normal. No gait instability.  Skin:  Skin is warm, dry and intact. No rash noted. Psychiatric: Mood and affect are normal. Speech and behavior are normal. Patient exhibits appropriate insight and judgment   ___________________________________________   LABS (all labs ordered are listed, but only abnormal results are displayed)  Labs Reviewed - No data to display  RADIOLOGY  Dg Toe 3rd Right  Result Date: 10/22/2016 CLINICAL DATA:  Injury. EXAM: RIGHT THIRD TOE COMPARISON:  No recent prior. FINDINGS: Fracture of the lower portion of the proximal phalanx of the right third digit noted. Fracture slightly displaced. The fracture does not appear to extend into the adjacent joint space. Diffuse degenerative change. IMPRESSION: Slightly displaced fracture of the base of the proximal phalanx of the right third digit. Electronically Signed   By: Marcello Moores  Register   On: 10/22/2016 16:15    ____________________________________________   PROCEDURES Procedures   Right second and third toes buddy taped, postoperative shoe applied by RN.  _________________________________________   INITIAL IMPRESSION / [place] / ED COURSE  Pertinent labs & imaging results that were available during my care of the patient were reviewed by me and considered in my medical decision making (see chart for details).  Well-appearing patient. No acute distress. Presents for the complaints of right third toe pain post mechanical injury. Denies fall or other injury. Per radiologist's right third toe slightly displaced fracture at the base of the proximal phalanges of the right third digit. Buddy taping, postoperative shoe. Encourage patient follow-up with PCP or podiatry next week. Encouraged supportive care, rest, ice and elevation. Patient denies need for medication prescription.  Discussed follow up with Primary care physician this week. Discussed follow up and return parameters including no resolution or any worsening concerns. Patient verbalized understanding and agreed to plan.   ____________________________________________   FINAL CLINICAL IMPRESSION(S) / ED DIAGNOSES  Final diagnoses:  Closed fracture of phalanx of right third toe, initial encounter     Discharge Medication List as of 10/22/2016  4:42 PM      Note: This dictation was prepared with Dragon dictation along with smaller phrase technology. Any transcriptional errors that result from this process are unintentional.    Clinical Course       Marylene Land, NP 10/22/16 Vernelle Emerald

## 2016-10-22 NOTE — ED Triage Notes (Signed)
Pt presents with a toe injury on her right foot. She kicked a chair last night walking through the living room. It is bruised and swollen a bit.

## 2016-10-22 NOTE — Discharge Instructions (Signed)
Ice, elevate and rest. Use post operative shoe and buddy tape toes.   Follow up with your primary care physician or podiatry as discussed. Return to Urgent care for new or worsening concerns.

## 2016-10-25 ENCOUNTER — Telehealth: Payer: Self-pay | Admitting: *Deleted

## 2016-10-25 NOTE — Telephone Encounter (Signed)
Courtesy call back, verified DOB, patient reported feeling much better. Advised patient to follow up with Dr Elvina Mattes if symptoms persist.

## 2016-11-04 ENCOUNTER — Ambulatory Visit (INDEPENDENT_AMBULATORY_CARE_PROVIDER_SITE_OTHER): Payer: Medicare HMO | Admitting: Physician Assistant

## 2016-11-04 ENCOUNTER — Encounter: Payer: Self-pay | Admitting: Physician Assistant

## 2016-11-04 VITALS — BP 130/60 | HR 95 | Temp 99.3°F | Resp 19 | Wt 120.6 lb

## 2016-11-04 DIAGNOSIS — S92501D Displaced unspecified fracture of right lesser toe(s), subsequent encounter for fracture with routine healing: Secondary | ICD-10-CM | POA: Diagnosis not present

## 2016-11-04 DIAGNOSIS — I959 Hypotension, unspecified: Secondary | ICD-10-CM | POA: Diagnosis not present

## 2016-11-04 DIAGNOSIS — R252 Cramp and spasm: Secondary | ICD-10-CM | POA: Diagnosis not present

## 2016-11-04 NOTE — Patient Instructions (Signed)
Muscle Cramps and Spasms Muscle cramps and spasms are when muscles tighten by themselves. They usually get better within minutes. Muscle cramps are painful. They are usually stronger and last longer than muscle spasms. Muscle spasms may or may not be painful. They can last a few seconds or much longer. HOME CARE  Drink enough fluid to keep your pee (urine) clear or pale yellow.  Massage, stretch, and relax the muscle.  Use a warm towel, heating pad, or warm shower water on tight muscles.  Place ice on the muscle if it is tender or in pain.  Put ice in a plastic bag.  Place a towel between your skin and the bag.  Leave the ice on for 15-20 minutes, 3-4 times a day.  Only take medicine as told by your doctor. GET HELP RIGHT AWAY IF:  Your cramps or spasms get worse, happen more often, or do not get better with time. MAKE SURE YOU:  Understand these instructions.  Will watch your condition.  Will get help right away if you are not doing well or get worse. This information is not intended to replace advice given to you by your health care provider. Make sure you discuss any questions you have with your health care provider. Document Released: 11/14/2008 Document Revised: 03/29/2013 Document Reviewed: 09/05/2015 Elsevier Interactive Patient Education  2017 [employer] Inc[employer]  

## 2016-11-04 NOTE — Progress Notes (Signed)
Patient: Jackie Turner Female    DOB: 31-Oct-1936   80 y.o.   MRN: [id number] Visit Date: 11/04/2016  Today's Provider: Mar Daring, PA-C   Chief Complaint  Patient presents with  . Follow-up    Blood Pressure  . Follow-up    Urgent Care   Subjective:    HPI Patient here to follow-up on blood pressure. Last office visit her bp was 80/60. Her bp today is 130/60. Patient had previously been on verapamil 180 mg to take half a tablet daily for what was believed to have been cluster headaches. She is not had any symptoms of cluster headaches in many years thus her verapamil was discontinued at last visit. Blood pressure is much improved today.  She also went to [facility] on 10/22/16 for closed fracture of phalanx of right pf right third toe. She reports good Compliance with treatment. She reports this condition is improved.  She is having some nighttime leg cramps and foot cramps. She does have known lumbar radiculopathy and I do feel that her cramping may be secondary to this. She has been drinking boost for a female supplement and states that this has helped her cramping some. Previous labs have been stable.     Allergies  Allergen Reactions  . Nsaids Other (See Comments)    History of gastric ulcer  . Amoxicillin Nausea Only and Rash  . Ibandronic Acid Other (See Comments)    Muscle weakness - advised not to take  . Actonel  [Risedronate] Other (See Comments)    Gastric ulcers  . Antihistamines, Chlorpheniramine-Type   . Aspirin     Gastric ulcer  . Butalbital-Aspirin-Caffeine Other (See Comments)    Other reaction(s): Unknown  . Clarithromycin Other (See Comments)    Bloating Bloating Bloating  . Codeine   . Gatifloxacin Other (See Comments)  . Moxifloxacin   . Penicillins Other (See Comments)  . Risedronate Sodium     Gastric ulcer  . Tylenol With Codeine #3  [Acetaminophen-Codeine]     Other reaction(s): Unknown  . Raloxifene Other  (See Comments)    Bloating bloating     Current Outpatient Prescriptions:  .  Calcium Carbonate-Vitamin D 600-400 MG-UNIT per tablet, Take 1 tablet by mouth 2 (two) times daily. , Disp: , Rfl:  .  dicyclomine (BENTYL) 10 MG capsule, Take 1 capsule (10 mg total) by mouth 3 (three) times daily as needed for spasms., Disp: 120 capsule, Rfl: 5 .  doxepin (SINEQUAN) 10 MG capsule, TAKE 1 CAPSULE BY MOUTH EVERY DAY., Disp: 90 capsule, Rfl: 1 .  doxepin (SINEQUAN) 50 MG capsule, TAKE ONE (1) CAPSULE EACH DAY, Disp: 30 capsule, Rfl: 5 .  fluticasone (FLONASE) 50 MCG/ACT nasal spray, Place 2 sprays into both nostrils daily. , Disp: , Rfl:  .  Fluticasone-Salmeterol (ADVAIR) 250-50 MCG/DOSE AEPB, Inhale 1 puff into the lungs 2 (two) times daily., Disp: 60 each, Rfl: 5 .  montelukast (SINGULAIR) 10 MG tablet, Take 1 tablet (10 mg total) by mouth at bedtime., Disp: 30 tablet, Rfl: 6 .  oxybutynin (DITROPAN XL) 15 MG 24 hr tablet, Take 15 mg by mouth at bedtime. , Disp: , Rfl:  .  pentosan polysulfate (ELMIRON) 100 MG capsule, Take 200 mg by mouth 2 (two) times daily. , Disp: , Rfl:  .  Tiotropium Bromide Monohydrate (SPIRIVA RESPIMAT) 1.25 MCG/ACT AERS, Inhale 1.25 mcg into the lungs 2 (two) times daily., Disp: 4 g, Rfl: 3 .  ULTRAM 50 MG tablet, TAKE ONE TO TWO TABLETS BY MOUTH AS NEEDED, Disp: 60 tablet, Rfl: 5 .  VENTOLIN HFA 108 (90 Base) MCG/ACT inhaler, INHALE TWO PUFFS INTO THE LUNGS EVERY 4 HOURS AS NEEDED FOR WHEEZING OR SHORTNESS OF BREATH, Disp: 18 g, Rfl: 3 .  nystatin (MYCOSTATIN) 100000 UNIT/ML suspension, Use as directed 5 mLs (500,000 Units total) in the mouth or throat 2 (two) times daily. (Patient not taking: Reported on 11/04/2016), Disp: 60 mL, Rfl: 1  Review of Systems  Cardiovascular: Negative for chest pain, palpitations and leg swelling.    Social History  Substance Use Topics  . Smoking status: Never Smoker  . Smokeless tobacco: Never Used  . Alcohol use No   Objective:    BP 130/60 (BP Location: Right Arm, Patient Position: Sitting, Cuff Size: Normal)   Pulse 95   Temp 99.3 F (37.4 C) (Oral)   Resp 19   Wt 120 lb 9.6 oz (54.7 kg)   SpO2 93%   BMI 22.79 kg/m   Physical Exam  Constitutional: She appears well-developed and well-nourished. No distress.  Neck: Normal range of motion. Neck supple. No tracheal deviation present. No thyromegaly present.  Cardiovascular: Normal rate, regular rhythm and normal heart sounds.  Exam reveals no gallop and no friction rub.   No murmur heard. Pulmonary/Chest: Effort normal and breath sounds normal. No respiratory distress. She has no wheezes. She has no rales.  Musculoskeletal: She exhibits no edema.  Right third toe was buddy taped and she was wearing hard soled shoe.  Lymphadenopathy:    She has no cervical adenopathy.  Skin: She is not diaphoretic.  Psychiatric: She has a normal mood and affect. Her behavior is normal. Judgment and thought content normal.  Vitals reviewed.       Assessment & Plan:     1. Leg cramps Discussed different medication options that may could help leg cramping but she is very sensitive to medications thus we decided to withhold medications at this time since it is not bothering her nightly nor is it debilitating. We did discuss possible use of tonic water to see if this may help her nighttime cramping with advice that this may not help but there is a possibility that it could. She is to call the office if symptoms worsen.  2. Closed fracture of phalanx of right third toe with routine healing, subsequent encounter Following up with Dr. Elvina Mattes on Wednesday, 11/06/2016.  3. Hypotension, unspecified hypotension type Blood pressure much improved today with discontinuing verapamil. Continue to stay off of verapamil at this time. She is going to follow up with Dr. Kathyrn Sheriff on 11/12/2016 for her sinus headaches.       Mar Daring, PA-C  [facility]

## 2016-11-06 DIAGNOSIS — S92511A Displaced fracture of proximal phalanx of right lesser toe(s), initial encounter for closed fracture: Secondary | ICD-10-CM | POA: Diagnosis not present

## 2016-11-06 DIAGNOSIS — M79671 Pain in right foot: Secondary | ICD-10-CM | POA: Diagnosis not present

## 2016-11-12 ENCOUNTER — Other Ambulatory Visit: Payer: Self-pay | Admitting: Otolaryngology

## 2016-11-12 DIAGNOSIS — R51 Headache: Secondary | ICD-10-CM | POA: Diagnosis not present

## 2016-11-12 DIAGNOSIS — G44221 Chronic tension-type headache, intractable: Secondary | ICD-10-CM

## 2016-11-13 ENCOUNTER — Other Ambulatory Visit: Payer: Self-pay | Admitting: Otolaryngology

## 2016-11-13 DIAGNOSIS — G44229 Chronic tension-type headache, not intractable: Secondary | ICD-10-CM

## 2016-11-21 DIAGNOSIS — M5416 Radiculopathy, lumbar region: Secondary | ICD-10-CM | POA: Diagnosis not present

## 2016-11-21 DIAGNOSIS — M48061 Spinal stenosis, lumbar region without neurogenic claudication: Secondary | ICD-10-CM | POA: Diagnosis not present

## 2016-11-26 ENCOUNTER — Other Ambulatory Visit
Admission: RE | Admit: 2016-11-26 | Discharge: 2016-11-26 | Disposition: A | Discharge status: Home / Self Care | Payer: Medicare HMO | Source: Ambulatory Visit | Attending: Otolaryngology | Admitting: Otolaryngology

## 2016-11-26 ENCOUNTER — Ambulatory Visit
Admission: RE | Admit: 2016-11-26 | Discharge: 2016-11-26 | Disposition: A | Discharge status: Home / Self Care | Payer: Medicare HMO | Source: Ambulatory Visit | Attending: Otolaryngology | Admitting: Otolaryngology

## 2016-11-26 DIAGNOSIS — G44229 Chronic tension-type headache, not intractable: Secondary | ICD-10-CM | POA: Diagnosis present

## 2016-11-26 DIAGNOSIS — R51 Headache: Secondary | ICD-10-CM | POA: Insufficient documentation

## 2016-11-26 DIAGNOSIS — G44209 Tension-type headache, unspecified, not intractable: Secondary | ICD-10-CM | POA: Diagnosis not present

## 2016-11-26 LAB — CREATININE, SERUM
Creatinine, Ser: 0.71 mg/dL (ref 0.44–1.00)
GFR calc non Af Amer: >60 mL/min (ref >60)

## 2016-11-26 MED ORDER — GADOBENATE DIMEGLUMINE 529 MG/ML IV SOLN
10.0000 mL | Freq: Once | INTRAVENOUS | Status: AC | PRN
  Administered 2016-11-26: 10 mL via INTRAVENOUS

## 2016-11-27 DIAGNOSIS — S92511D Displaced fracture of proximal phalanx of right lesser toe(s), subsequent encounter for fracture with routine healing: Secondary | ICD-10-CM | POA: Diagnosis not present

## 2017-01-06 ENCOUNTER — Other Ambulatory Visit: Payer: Self-pay | Admitting: Physician Assistant

## 2017-01-16 DIAGNOSIS — R350 Frequency of micturition: Secondary | ICD-10-CM | POA: Diagnosis not present

## 2017-01-16 DIAGNOSIS — N393 Stress incontinence (female) (male): Secondary | ICD-10-CM | POA: Diagnosis not present

## 2017-01-16 DIAGNOSIS — N3010 Interstitial cystitis (chronic) without hematuria: Secondary | ICD-10-CM | POA: Diagnosis not present

## 2017-01-16 DIAGNOSIS — N8112 Cystocele, lateral: Secondary | ICD-10-CM | POA: Diagnosis not present

## 2017-01-28 DIAGNOSIS — Z961 Presence of intraocular lens: Secondary | ICD-10-CM | POA: Diagnosis not present

## 2017-01-28 DIAGNOSIS — L718 Other rosacea: Secondary | ICD-10-CM | POA: Diagnosis not present

## 2017-02-04 ENCOUNTER — Ambulatory Visit (INDEPENDENT_AMBULATORY_CARE_PROVIDER_SITE_OTHER): Payer: Medicare HMO

## 2017-02-04 ENCOUNTER — Encounter (INDEPENDENT_AMBULATORY_CARE_PROVIDER_SITE_OTHER): Payer: Self-pay | Admitting: Orthopaedic Surgery

## 2017-02-04 ENCOUNTER — Ambulatory Visit (INDEPENDENT_AMBULATORY_CARE_PROVIDER_SITE_OTHER): Payer: Medicare HMO | Admitting: Orthopaedic Surgery

## 2017-02-04 VITALS — BP 128/71 | HR 67 | Resp 14 | Ht 61.0 in | Wt 117.0 lb

## 2017-02-04 DIAGNOSIS — M79622 Pain in left upper arm: Secondary | ICD-10-CM | POA: Diagnosis not present

## 2017-02-04 MED ORDER — BUPIVACAINE HCL 0.5 % IJ SOLN
2.0000 mL | INTRAMUSCULAR | Status: AC | PRN
  Administered 2017-02-04: 2 mL via INTRA_ARTICULAR

## 2017-02-04 MED ORDER — LIDOCAINE HCL 1 % IJ SOLN
2.0000 mL | INTRAMUSCULAR | Status: AC | PRN
  Administered 2017-02-04: 2 mL

## 2017-02-04 MED ORDER — METHYLPREDNISOLONE ACETATE 40 MG/ML IJ SUSP
80.0000 mg | INTRAMUSCULAR | Status: AC | PRN
  Administered 2017-02-04: 80 mg

## 2017-02-04 NOTE — Progress Notes (Signed)
Office Visit Note   Patient: Jackie Turner           Date of Birth: [birth year]           MRN: [id number] Visit Date: 02/04/2017              Requested by: Mar Daring, PA-C [address], [place] [id number] PCP: Mar Daring, PA-C   Assessment & Plan: Visit Diagnoses:  1. Pain in left upper arm     Plan: #1: Subacromial injection to the left shoulder was performed. She did have improvement in her pain especially when she internally rotated and Rich for the middle of her back. #2: If this does not improve she will give [place] a call and we can schedule an MRI scan of her her.  Follow-Up Instructions: Return if symptoms worsen or fail to improve.   Orders:  Orders Placed This Encounter  Procedures  . Large Joint Injection/Arthrocentesis  . XR Shoulder Left  . XR Humerus Left   No orders of the defined types were placed in this encounter.     Procedures: Large Joint Inj Date/Time: 02/04/2017 3:12 PM Performed by: Biagio Borg D Authorized by: Biagio Borg D   Consent Given by:  Patient Timeout: prior to procedure the correct patient, procedure, and site was verified   Indications:  Pain Location:  Shoulder Site:  L subacromial bursa Prep: patient was prepped and draped in usual sterile fashion   Needle Size:  25 G Needle Length:  1.5 inches Approach:  Lateral Ultrasound Guidance: No   Fluoroscopic Guidance: No   Arthrogram: No   Medications:  80 mg methylPREDNISolone acetate 40 MG/ML; 2 mL lidocaine 1 %; 2 mL bupivacaine 0.5 % Aspiration Attempted: No   Patient tolerance:  Patient tolerated the procedure well with no immediate complications     Clinical Data: No additional findings.   Subjective: Chief Complaint  Patient presents with  . Left Upper Arm - Pain    Patient is a very pleasant 81 year old white female who is seen today for evaluation of her left upper arm. She states about 2 weeks ago she started  having pain in the area of the proximal third and mid third section of the humerus. She does not remember any history of injury or trauma. It just started aching like a toothache. She's tried many maneuvers to try to get her to stop hurting but it continues. She denies any numbness or tingling. Denies any shoulder pain. Denies any cervical spine pain. She did take something for her low back pain today and actually it did have an improvement in the area but she still has some pain in the area.    Review of Systems  Neurological: Positive for headaches (migraines).  Psychiatric/Behavioral: The patient is nervous/anxious.   All other systems reviewed and are negative.    Objective: Vital Signs: BP 128/71   Pulse 67   Resp 14   Ht 5\' 1"  (1.549 m)   Wt 117 lb (53.1 kg)   BMI 22.11 kg/m   Physical Exam  Constitutional: She is oriented to person, place, and time. She appears well-developed and well-nourished.  HENT:  Head: Normocephalic and atraumatic.  Eyes: EOM are normal. Pupils are equal, round, and reactive to light.  Pulmonary/Chest: Effort normal.  Neurological: She is alert and oriented to person, place, and time.  Skin: Skin is warm and dry.  Psychiatric: She has a normal mood and affect. Her  behavior is normal. Judgment and thought content normal.    Back Exam   Comments:  Cervical spine reveals 60 right and left rotation without pain. Tension to around 45. She may touch her chin to her chest without problems. Denies any pain.   Left Shoulder Exam   Tenderness  The patient is experiencing no tenderness.     Range of Motion  Passive Abduction: 170  Forward Flexion: 170  External Rotation: 80  Internal Rotation 90 degrees: 80   Muscle Strength  Abduction: 4/5  Internal Rotation: 4/5  External Rotation: 4/5  Supraspinatus: 4/5   Tests  Impingement: negative  Comments:  She has some diffuse tenderness over the junction of the proximal and mid third of the  anterior medial portion of her humerus. I don't particularly feel a mass however there is some fullness in this area. She has excellent strength in her biceps triceps abductors and adductors      Specialty Comments:  No specialty comments available.  Imaging: Xr Humerus Left  Result Date: 02/04/2017 Two-view humerus does not reveal any lesions. There is calcification the aortic arch noted. This was reviewed by Dr. Durward Fortes.  Xr Shoulder Left  Result Date: 02/04/2017 Two-view left shoulder reveals type I.5 acromion. She does have some distal clavicle degenerative changes and inferior spurring.    PMFS History: Patient Active Problem List   Diagnosis Date Noted  . Recurrent URI (upper respiratory infection) 10/31/2015  . Hypoxia 09/27/2015  . Abnormal CXR 09/27/2015  . Asthma with allergic rhinitis 08/31/2015  . Back ache 08/31/2015  . Chronic interstitial cystitis 08/31/2015  . Grief reaction 08/31/2015  . Hypercholesteremia 06/26/2015  . Migraine 06/26/2015  . Asthma 06/26/2015  . IBS (irritable bowel syndrome) 06/26/2015  . Osteoporosis 06/26/2015  . Fatigue 06/26/2015  . Lumbar radiculopathy 04/27/2013   Past Medical History:  Diagnosis Date  . Allergy   . Asthma   . Hyperlipidemia   . Osteoporosis     Family History  Problem Relation Age of Onset  . Heart disease Mother   . CAD Mother   . Congestive Heart Failure Mother   . Osteoarthritis Mother   . Cancer Sister     breast  . Breast cancer Sister     Past Surgical History:  Procedure Laterality Date  . ABDOMINAL HYSTERECTOMY  2003  . BREAST BIOPSY  1976, 1978   benign  . carpal tunnel repair Bilateral    R 1990; L 1997  . EYE SURGERY Bilateral    cataract extraction  . HAND TENDON SURGERY Right    Trigger finger  . TONSILLECTOMY  1961   Social History   Occupational History  . Retired    Social History Main Topics  . Smoking status: Never Smoker  . Smokeless tobacco: Never Used  .  Alcohol use No  . Drug use: No  . Sexual activity: No

## 2017-02-04 NOTE — Progress Notes (Deleted)
   Office Visit Note   Patient: Jackie Turner           Date of Birth: [birth year]           MRN: [id number] Visit Date: 02/04/2017              Requested by: Mar Daring, PA-C [place], [place] [address] PCP: Mar Daring, PA-C   Assessment & Plan: Visit Diagnoses:  1. Pain in left upper arm     Plan: ***  Follow-Up Instructions: No Follow-up on file.   Orders:  Orders Placed This Encounter  Procedures  . XR Shoulder Left  . XR Humerus Left   No orders of the defined types were placed in this encounter.     Procedures: No procedures performed   Clinical Data: No additional findings.   Subjective: Chief Complaint  Patient presents with  . Left Upper Arm - Pain    Pt presents with Left upper arm pain x 2 weeks. Denies injury, edema, only pain.    Review of Systems   Objective: Vital Signs: BP 128/71   Pulse 67   Resp 14   Ht 5\' 1"  (1.549 m)   Wt 117 lb (53.1 kg)   BMI 22.11 kg/m   Physical Exam  Ortho Exam  Specialty Comments:  No specialty comments available.  Imaging: No results found.   PMFS History: Patient Active Problem List   Diagnosis Date Noted  . Recurrent URI (upper respiratory infection) 10/31/2015  . Hypoxia 09/27/2015  . Abnormal CXR 09/27/2015  . Asthma with allergic rhinitis 08/31/2015  . Back ache 08/31/2015  . Chronic interstitial cystitis 08/31/2015  . Grief reaction 08/31/2015  . Hypercholesteremia 06/26/2015  . Migraine 06/26/2015  . Asthma 06/26/2015  . IBS (irritable bowel syndrome) 06/26/2015  . Osteoporosis 06/26/2015  . Fatigue 06/26/2015  . Lumbar radiculopathy 04/27/2013   Past Medical History:  Diagnosis Date  . Allergy   . Asthma   . Hyperlipidemia   . Osteoporosis     Family History  Problem Relation Age of Onset  . Heart disease Mother   . CAD Mother   . Congestive Heart Failure Mother   . Osteoarthritis Mother   . Cancer Sister     breast    . Breast cancer Sister     Past Surgical History:  Procedure Laterality Date  . ABDOMINAL HYSTERECTOMY  2003  . BREAST BIOPSY  1976, 1978   benign  . carpal tunnel repair Bilateral    R 1990; L 1997  . EYE SURGERY Bilateral    cataract extraction  . HAND TENDON SURGERY Right    Trigger finger  . TONSILLECTOMY  1961   Social History   Occupational History  . Retired    Social History Main Topics  . Smoking status: Never Smoker  . Smokeless tobacco: Never Used  . Alcohol use No  . Drug use: No  . Sexual activity: No

## 2017-02-10 ENCOUNTER — Other Ambulatory Visit: Payer: Self-pay

## 2017-02-10 ENCOUNTER — Telehealth (INDEPENDENT_AMBULATORY_CARE_PROVIDER_SITE_OTHER): Payer: Self-pay | Admitting: Orthopaedic Surgery

## 2017-02-10 MED ORDER — VERAPAMIL HCL ER 180 MG PO TBCR: 90.0000 mg | EXTENDED_RELEASE_TABLET | Freq: Every day | ORAL | 1 refills | Status: DC

## 2017-02-10 NOTE — Telephone Encounter (Signed)
She will call in am for appt to evaluate cspine vs cts

## 2017-02-10 NOTE — Telephone Encounter (Signed)
Please advise 

## 2017-02-10 NOTE — Telephone Encounter (Signed)
Pharmacy requesting refill Last ov 11/04/16 Last filled  Please review. Thank you. sd 02/10/13

## 2017-02-10 NOTE — Telephone Encounter (Signed)
Patient states she was seen last week by Aaron Edelman and received an injection in her upper arm. Since the weekend, she has been experiencing tingling and numbness in her fingers. Please advise.

## 2017-02-11 ENCOUNTER — Telehealth (INDEPENDENT_AMBULATORY_CARE_PROVIDER_SITE_OTHER): Payer: Self-pay | Admitting: Orthopaedic Surgery

## 2017-02-11 NOTE — Telephone Encounter (Signed)
Patient left a message stating that she had talked to Aaron Edelman about her arm yesterday and also state that she was calling him back today to talk about her arm.  [id number].  Thank you.

## 2017-02-12 NOTE — Telephone Encounter (Signed)
See message.

## 2017-02-18 ENCOUNTER — Telehealth (INDEPENDENT_AMBULATORY_CARE_PROVIDER_SITE_OTHER): Payer: Self-pay | Admitting: Orthopaedic Surgery

## 2017-02-18 NOTE — Telephone Encounter (Signed)
Patient wanted Jackie Turner to know that she is now having numbness in her thumb and index finger after receiving the shot two weeks ago.  [id number]

## 2017-02-18 NOTE — Telephone Encounter (Signed)
Please advise 

## 2017-02-18 NOTE — Telephone Encounter (Signed)
PLEASE HAVE HER COME IN TO EVALUATE C SPINE/ CARPAL TUNNEL

## 2017-02-18 NOTE — Telephone Encounter (Signed)
Called pt and gave message from BP and tx her to [facility] for appt.

## 2017-02-20 ENCOUNTER — Ambulatory Visit (INDEPENDENT_AMBULATORY_CARE_PROVIDER_SITE_OTHER): Payer: Medicare HMO

## 2017-02-20 ENCOUNTER — Ambulatory Visit (INDEPENDENT_AMBULATORY_CARE_PROVIDER_SITE_OTHER): Payer: Medicare HMO | Admitting: Orthopaedic Surgery

## 2017-02-20 ENCOUNTER — Encounter (INDEPENDENT_AMBULATORY_CARE_PROVIDER_SITE_OTHER): Payer: Self-pay | Admitting: Orthopaedic Surgery

## 2017-02-20 VITALS — BP 133/72 | HR 68 | Resp 14 | Ht 61.0 in | Wt 117.0 lb

## 2017-02-20 DIAGNOSIS — R202 Paresthesia of skin: Secondary | ICD-10-CM | POA: Diagnosis not present

## 2017-02-20 DIAGNOSIS — R200 Anesthesia of skin: Secondary | ICD-10-CM

## 2017-02-20 DIAGNOSIS — M79621 Pain in right upper arm: Secondary | ICD-10-CM | POA: Diagnosis not present

## 2017-02-20 NOTE — Progress Notes (Signed)
Office Visit Note   Patient: Jackie Turner           Date of Birth: 04-Feb-1936           MRN: [id number] Visit Date: 02/20/2017              Requested by: Mar Daring, PA-C [address] [place], [place] [address] PCP: Mar Daring, PA-C   Assessment & Plan: Visit Diagnoses:  1. Pain in right upper arm   2. Numbness and tingling in left arm     Plan: 1: EMGs, NCV of both arms to rule out tunnel versus cervical radiculopathy 2: Follow-up after EMG's   Follow-Up Instructions: Return if symptoms worsen or fail to improve.   Orders:  Orders Placed This Encounter  Procedures  . XR Cervical Spine 2 or 3 views  . Ambulatory referral to Physical Medicine Rehab   No orders of the defined types were placed in this encounter.     Procedures: No procedures performed   Clinical Data: No additional findings.   Subjective: Chief Complaint  Patient presents with  . Left Index Finger - Numbness, Pain  . Left Thumb - Numbness, Pain    Patient is a very pleasant 80 year old white female who was seen on 02/04/2017 for evaluation of her left upper arm. She states about 2 weeks ago she started having pain in the area of the proximal third and mid third section of the humerus. She does not remember any history of injury or trauma. It just started aching like a toothache. She's tried many maneuvers to try to get it to stop hurting but it continued. She denies any numbness or tingling. Denies any shoulder pain. Denies any cervical spine pain. She did take something for her low back pain today and actually it did have an improvement in the upper arm area but she still has some pain in the area.  She had a corticosteroid injection on that day and stated that it actually had some improvement. However most recently now she started having numbness in her thumb and her index finger with pain in the radial aspect of her forearm.   Today,She relates she  remembered moving a dresser on carpet and that's when her pain in the left arm began.  Seen today for reevaluation. Of interesting note she did have carpal tunnel releases she remembers somewhere in the 90s.        Review of Systems  Constitutional: Negative.   HENT: Negative.   Respiratory: Negative.   Cardiovascular: Negative.   Gastrointestinal: Negative.   Genitourinary: Negative.   Skin: Negative.   Neurological: Negative.   Hematological: Negative.   Psychiatric/Behavioral: Negative.      Objective: Vital Signs: BP 133/72   Pulse 68   Resp 14   Ht 5\' 1"  (1.549 m)   Wt 117 lb (53.1 kg)   BMI 22.11 kg/m   Physical Exam  Constitutional: She is oriented to person, place, and time. She appears well-developed and well-nourished.  HENT:  Head: Normocephalic and atraumatic.  Eyes: EOM are normal. Pupils are equal, round, and reactive to light.  Pulmonary/Chest: Effort normal.  Neurological: She is alert and oriented to person, place, and time.  Skin: Skin is warm and dry.  Psychiatric: She has a normal mood and affect. Her behavior is normal. Judgment and thought content normal.    Back Exam   Tenderness  The patient is experiencing no tenderness.   Range of  Motion  Back extension: Backward extension proximally of 45 without pain cervical spine.  Back flexion: Forward flexion almost chin to chest.  Rotation Right: 50 (Cervical spine)  Rotation Left: 50    Left Hand Exam   Comments:  She does does have increase in her numbness and tingling in her hand and the thumb and index with Phalen's testing.      Specialty Comments:  No specialty comments available.  Imaging: Xr Cervical Spine 2 Or 3 Views  Result Date: 02/20/2017 Two-view cervical spine reveals bilateral cervical ribs. She has a history of disc disease C5-6 anterior spurring off of C5. Alignment is appropriate. Normal lordosis    PMFS History: Patient Active Problem List   Diagnosis Date  Noted  . Recurrent URI (upper respiratory infection) 10/31/2015  . Hypoxia 09/27/2015  . Abnormal CXR 09/27/2015  . Asthma with allergic rhinitis 08/31/2015  . Back ache 08/31/2015  . Chronic interstitial cystitis 08/31/2015  . Grief reaction 08/31/2015  . Hypercholesteremia 06/26/2015  . Migraine 06/26/2015  . Asthma 06/26/2015  . IBS (irritable bowel syndrome) 06/26/2015  . Osteoporosis 06/26/2015  . Fatigue 06/26/2015  . Lumbar radiculopathy 04/27/2013   Past Medical History:  Diagnosis Date  . Allergy   . Asthma   . Hyperlipidemia   . Osteoporosis     Family History  Problem Relation Age of Onset  . Heart disease Mother   . CAD Mother   . Congestive Heart Failure Mother   . Osteoarthritis Mother   . Cancer Sister     breast  . Breast cancer Sister     Past Surgical History:  Procedure Laterality Date  . ABDOMINAL HYSTERECTOMY  2003  . BREAST BIOPSY  1976, 1978   benign  . carpal tunnel repair Bilateral    R 1990; L 1997  . EYE SURGERY Bilateral    cataract extraction  . HAND TENDON SURGERY Right    Trigger finger  . TONSILLECTOMY  1961   Social History   Occupational History  . Retired    Social History Main Topics  . Smoking status: Never Smoker  . Smokeless tobacco: Never Used  . Alcohol use No  . Drug use: No  . Sexual activity: No

## 2017-02-20 NOTE — Progress Notes (Signed)
Patient is a very pleasant 81 year old white female who was seen on 02/04/2017 for evaluation of her left upper arm. She states about 2 weeks ago she started having pain in the area of the proximal third and mid third section of the humerus. She does not remember any history of injury or trauma. It just started aching like a toothache. She's tried many maneuvers to try to get it to stop hurting but it continued. She denies any numbness or tingling. Denies any shoulder pain. Denies any cervical spine pain. She did take something for her low back pain today and actually it did have an improvement in the upper arm area but she still has some pain in the area.  She had a corticosteroid injection on that day and stated that it actually had some improvement. However most recently now she started having numbness in her thumb and her index finger with pain in the radial aspect of her forearm.   Today,She relates she remembered moving a dresser on carpet and that's when her pain in the left arm began.  Seen today for reevaluation. Of interesting note she did have carpal tunnel releases she remembers somewhere in the 90s.

## 2017-02-26 ENCOUNTER — Ambulatory Visit (INDEPENDENT_AMBULATORY_CARE_PROVIDER_SITE_OTHER): Payer: Medicare HMO | Admitting: Orthopedic Surgery

## 2017-02-26 ENCOUNTER — Telehealth (INDEPENDENT_AMBULATORY_CARE_PROVIDER_SITE_OTHER): Payer: Self-pay | Admitting: Physical Medicine and Rehabilitation

## 2017-02-26 NOTE — Telephone Encounter (Signed)
Left message for pt to call back for rescheduling

## 2017-02-26 NOTE — Telephone Encounter (Signed)
Patient called wanting to make a later appointment on Friday or sometime next week.  She is on medication and it is hard for her to get up and around early in the morning.  VN#[phone].  Thank you.

## 2017-02-26 NOTE — Telephone Encounter (Signed)
Spoke with pt. She will keep the 3/16 appt.

## 2017-02-28 ENCOUNTER — Ambulatory Visit (INDEPENDENT_AMBULATORY_CARE_PROVIDER_SITE_OTHER): Payer: Medicare HMO | Admitting: Physical Medicine and Rehabilitation

## 2017-02-28 DIAGNOSIS — R202 Paresthesia of skin: Secondary | ICD-10-CM | POA: Diagnosis not present

## 2017-02-28 NOTE — Progress Notes (Signed)
Jackie Turner - 81 y.o. female MRN [id number]  Date of birth: June 11, 1936  Office Visit Note: Visit Date: 02/28/2017 PCP: Mar Daring, PA-C Referred by: Florian Buff*  Subjective: Chief Complaint  Patient presents with  . Left Arm - Pain   HPI: Jackie Turner is a 81 year old right-hand dominant female who complains of worsening left shoulder and arm pain for approximately 1 month. Her initial pain was in the upper part of the humeral area. She denied any specific trauma or any recent upper respiratory infections. She did have a a subacromial shoulder injection with Dr. Durward Fortes around 3 weeks ago with some relief. She reports that a couple of days after injection she starting having tingling in thumb and index finger. Denies any weakness or loss of strength. She had recently been noticing a prominence of the [facility] joint on the left hand. She has had prior bilateral carpal tunnel releases.      ROS Otherwise per HPI.  Assessment & Plan: Visit Diagnoses:  1. Paresthesia of skin     Plan: No additional findings.  Impression: The above electrodiagnostic study is ABNORMAL and reveals evidence of a severe left median nerve entrapment at the wrist (carpal tunnel syndrome) affecting sensory and motor components.   There is no significant electrodiagnostic evidence of any other focal nerve entrapment, brachial plexopathy or cervical radiculopathy.   Recommendations: 1.  Follow-up with referring physician. 2.  Suggest surgical evaluation. May be appropriate for diagnostic carpal tunnel injection to see if this helps the shoulder pain.    Meds & Orders: No orders of the defined types were placed in this encounter.   Orders Placed This Encounter  Procedures  . NCV with EMG (electromyography)    Follow-up: Return for Dr. Joni Fears as scheduled.   Procedures: No procedures performed  EMG & NCV Findings: Evaluation of the left median motor nerve showed  reduced amplitude (0.6 mV) and decreased conduction velocity (Elbow-Wrist, 33 m/s).  The left median (across palm) sensory nerve showed prolonged distal peak latency (Wrist, 4.4 ms) and prolonged distal peak latency (Palm, 2.5 ms).  All remaining nerves (as indicated in the following tables) were within normal limits.    Right APB shows decreased muscle insertional activity. Atrophy along with increased amplitude but without the presence of frank denervation signal. All examined other muscles (as indicated in the following table) showed no evidence of electrical instability.    Impression: The above electrodiagnostic study is ABNORMAL and reveals evidence of a severe left median nerve entrapment at the wrist (carpal tunnel syndrome) affecting sensory and motor components.   There is no significant electrodiagnostic evidence of any other focal nerve entrapment, brachial plexopathy or cervical radiculopathy.   Recommendations: 1.  Follow-up with referring physician. 2.  Suggest surgical evaluation. May be appropriate for diagnostic carpal tunnel injection to see if this helps the shoulder pain.   Nerve Conduction Studies Anti Sensory Summary Table   Stim Site NR Peak (ms) Norm Peak (ms) P-T Amp (V) Norm P-T Amp Site1 Site2 Delta-P (ms) Dist (cm) Vel (m/s) Norm Vel (m/s)  Left Median Acr Palm Anti Sensory (2nd Digit)  33.5C  Wrist    *4.4 <3.6 21.4 >10 Wrist Palm 1.9 0.0    Palm    *2.5 <2.0 18.6         Left Radial Anti Sensory (Base 1st Digit)  33.3C  Wrist    2.0 <3.1 14.9  Wrist Base 1st Digit 2.0 0.0  Left Ulnar Anti Sensory (5th Digit)  33.6C  Wrist    2.9 <3.7 24.4 >15.0 Wrist 5th Digit 2.9 14.0 48 >38   Motor Summary Table   Stim Site NR Onset (ms) Norm Onset (ms) O-P Amp (mV) Norm O-P Amp Site1 Site2 Delta-0 (ms) Dist (cm) Vel (m/s) Norm Vel (m/s)  Left Median Motor (Abd Poll Brev)  33.4C  Wrist    3.5 <4.2 *0.6 >5 Elbow Wrist 5.8 19.0 *33 >50  Elbow    9.3  3.3           Left Ulnar Motor (Abd Dig Min)  33.6C  Wrist    2.5 <4.2 8.2 >3 B Elbow Wrist 2.9 19.0 66 >53  B Elbow    5.4  7.6  A Elbow B Elbow 1.2 9.0 75 >53  A Elbow    6.6  7.4          EMG   Side Muscle Nerve Root Ins Act Fibs Psw Amp Dur Poly Recrt Int Fraser Din Comment  Left Abd Poll Brev Median C8-T1 Dcr Nml Nml Inc Nml 0 Dcr Nml   Left 1stDorInt Ulnar C8-T1 Nml Nml Nml Nml Nml 0 Nml Nml   Left PronatorTeres Median C6-7 Nml Nml Nml Nml Nml 0 Nml Nml   Left Biceps Musculocut C5-6 Nml Nml Nml Nml Nml 0 Nml Nml   Left Deltoid Axillary C5-6 Nml Nml Nml Nml Nml 0 Nml Nml     Nerve Conduction Studies Anti Sensory Left/Right Comparison   Stim Site L Lat (ms) R Lat (ms) L-R Lat (ms) L Amp (V) R Amp (V) L-R Amp (%) Site1 Site2 L Vel (m/s) R Vel (m/s) L-R Vel (m/s)  Median Acr Palm Anti Sensory (2nd Digit)  33.5C  Wrist *4.4   21.4   Wrist Palm     Palm *2.5   18.6         Radial Anti Sensory (Base 1st Digit)  33.3C  Wrist 2.0   14.9   Wrist Base 1st Digit     Ulnar Anti Sensory (5th Digit)  33.6C  Wrist 2.9   24.4   Wrist 5th Digit 48     Motor Left/Right Comparison   Stim Site L Lat (ms) R Lat (ms) L-R Lat (ms) L Amp (mV) R Amp (mV) L-R Amp (%) Site1 Site2 L Vel (m/s) R Vel (m/s) L-R Vel (m/s)  Median Motor (Abd Poll Brev)  33.4C  Wrist 3.5   *0.6   Elbow Wrist *33    Elbow 9.3   3.3         Ulnar Motor (Abd Dig Min)  33.6C  Wrist 2.5   8.2   B Elbow Wrist 66    B Elbow 5.4   7.6   A Elbow B Elbow 75    A Elbow 6.6   7.4               Clinical History: No specialty comments available.  She reports that she has never smoked. She has never used smokeless tobacco. No results for input(s): HGBA1C, LABURIC in the last 8760 hours.  Objective:  VS:  HT:    WT:   BMI:     BP:   HR: bpm  TEMP: ( )  RESP:  Physical Exam  Musculoskeletal:  Examination of both hands shows atrophy of the left APB musculature with decreased strength with thumb abduction. Interestingly sensation to  light touch appears to be intact at least to her  acknowledgment during testing. She does not have any atrophy of the FDI or hand intrinsics. She has good strength in all other muscle groups including wrist extension long finger flexion. She has full cervical range of motion without a positive Spurling's test. She has a negative Hoffmann's test. There is no swelling or allodynia or temperature change left or right.    Ortho Exam Imaging: No results found.  Past Medical/Family/Surgical/Social History: Medications & Allergies reviewed per EMR Patient Active Problem List   Diagnosis Date Noted  . Recurrent URI (upper respiratory infection) 10/31/2015  . Hypoxia 09/27/2015  . Abnormal CXR 09/27/2015  . Asthma with allergic rhinitis 08/31/2015  . Back ache 08/31/2015  . Chronic interstitial cystitis 08/31/2015  . Grief reaction 08/31/2015  . Hypercholesteremia 06/26/2015  . Migraine 06/26/2015  . Asthma 06/26/2015  . IBS (irritable bowel syndrome) 06/26/2015  . Osteoporosis 06/26/2015  . Fatigue 06/26/2015  . Lumbar radiculopathy 04/27/2013   Past Medical History:  Diagnosis Date  . Allergy   . Asthma   . Hyperlipidemia   . Osteoporosis    Family History  Problem Relation Age of Onset  . Heart disease Mother   . CAD Mother   . Congestive Heart Failure Mother   . Osteoarthritis Mother   . Cancer Sister     breast  . Breast cancer Sister    Past Surgical History:  Procedure Laterality Date  . ABDOMINAL HYSTERECTOMY  2003  . BREAST BIOPSY  1976, 1978   benign  . carpal tunnel repair Bilateral    R 1990; L 1997  . EYE SURGERY Bilateral    cataract extraction  . HAND TENDON SURGERY Right    Trigger finger  . TONSILLECTOMY  1961   Social History   Occupational History  . Retired    Social History Main Topics  . Smoking status: Never Smoker  . Smokeless tobacco: Never Used  . Alcohol use No  . Drug use: No  . Sexual activity: No

## 2017-03-03 NOTE — Procedures (Signed)
EMG & NCV Findings: Evaluation of the left median motor nerve showed reduced amplitude (0.6 mV) and decreased conduction velocity (Elbow-Wrist, 33 m/s).  The left median (across palm) sensory nerve showed prolonged distal peak latency (Wrist, 4.4 ms) and prolonged distal peak latency (Palm, 2.5 ms).  All remaining nerves (as indicated in the following tables) were within normal limits.    Right APB shows decreased muscle insertional activity. Atrophy along with increased amplitude but without the presence of frank denervation signal. All examined other muscles (as indicated in the following table) showed no evidence of electrical instability.    Impression: The above electrodiagnostic study is ABNORMAL and reveals evidence of a severe left median nerve entrapment at the wrist (carpal tunnel syndrome) affecting sensory and motor components.   There is no significant electrodiagnostic evidence of any other focal nerve entrapment, brachial plexopathy or cervical radiculopathy.   Recommendations: 1.  Follow-up with referring physician. 2.  Suggest surgical evaluation. May be appropriate for diagnostic carpal tunnel injection to see if this helps the shoulder pain.   Nerve Conduction Studies Anti Sensory Summary Table   Stim Site NR Peak (ms) Norm Peak (ms) P-T Amp (V) Norm P-T Amp Site1 Site2 Delta-P (ms) Dist (cm) Vel (m/s) Norm Vel (m/s)  Left Median Acr Palm Anti Sensory (2nd Digit)  33.5C  Wrist    *4.4 <3.6 21.4 >10 Wrist Palm 1.9 0.0    Palm    *2.5 <2.0 18.6         Left Radial Anti Sensory (Base 1st Digit)  33.3C  Wrist    2.0 <3.1 14.9  Wrist Base 1st Digit 2.0 0.0    Left Ulnar Anti Sensory (5th Digit)  33.6C  Wrist    2.9 <3.7 24.4 >15.0 Wrist 5th Digit 2.9 14.0 48 >38   Motor Summary Table   Stim Site NR Onset (ms) Norm Onset (ms) O-P Amp (mV) Norm O-P Amp Site1 Site2 Delta-0 (ms) Dist (cm) Vel (m/s) Norm Vel (m/s)  Left Median Motor (Abd Poll Brev)  33.4C  Wrist    3.5  <4.2 *0.6 >5 Elbow Wrist 5.8 19.0 *33 >50  Elbow    9.3  3.3         Left Ulnar Motor (Abd Dig Min)  33.6C  Wrist    2.5 <4.2 8.2 >3 B Elbow Wrist 2.9 19.0 66 >53  B Elbow    5.4  7.6  A Elbow B Elbow 1.2 9.0 75 >53  A Elbow    6.6  7.4          EMG   Side Muscle Nerve Root Ins Act Fibs Psw Amp Dur Poly Recrt Int Fraser Din Comment  Left Abd Poll Brev Median C8-T1 Dcr Nml Nml Inc Nml 0 Dcr Nml   Left 1stDorInt Ulnar C8-T1 Nml Nml Nml Nml Nml 0 Nml Nml   Left PronatorTeres Median C6-7 Nml Nml Nml Nml Nml 0 Nml Nml   Left Biceps Musculocut C5-6 Nml Nml Nml Nml Nml 0 Nml Nml   Left Deltoid Axillary C5-6 Nml Nml Nml Nml Nml 0 Nml Nml     Nerve Conduction Studies Anti Sensory Left/Right Comparison   Stim Site L Lat (ms) R Lat (ms) L-R Lat (ms) L Amp (V) R Amp (V) L-R Amp (%) Site1 Site2 L Vel (m/s) R Vel (m/s) L-R Vel (m/s)  Median Acr Palm Anti Sensory (2nd Digit)  33.5C  Wrist *4.4   21.4   Wrist Palm     Palm *  2.5   18.6         Radial Anti Sensory (Base 1st Digit)  33.3C  Wrist 2.0   14.9   Wrist Base 1st Digit     Ulnar Anti Sensory (5th Digit)  33.6C  Wrist 2.9   24.4   Wrist 5th Digit 48     Motor Left/Right Comparison   Stim Site L Lat (ms) R Lat (ms) L-R Lat (ms) L Amp (mV) R Amp (mV) L-R Amp (%) Site1 Site2 L Vel (m/s) R Vel (m/s) L-R Vel (m/s)  Median Motor (Abd Poll Brev)  33.4C  Wrist 3.5   *0.6   Elbow Wrist *33    Elbow 9.3   3.3         Ulnar Motor (Abd Dig Min)  33.6C  Wrist 2.5   8.2   B Elbow Wrist 66    B Elbow 5.4   7.6   A Elbow B Elbow 75    A Elbow 6.6   7.4

## 2017-03-04 ENCOUNTER — Ambulatory Visit (INDEPENDENT_AMBULATORY_CARE_PROVIDER_SITE_OTHER): Payer: Medicare HMO | Admitting: Orthopaedic Surgery

## 2017-03-04 ENCOUNTER — Encounter (INDEPENDENT_AMBULATORY_CARE_PROVIDER_SITE_OTHER): Payer: Self-pay | Admitting: Physical Medicine and Rehabilitation

## 2017-03-04 DIAGNOSIS — G5602 Carpal tunnel syndrome, left upper limb: Secondary | ICD-10-CM

## 2017-03-04 NOTE — Progress Notes (Signed)
Office Visit Note   Patient: Jackie Turner           Date of Birth: 07-15-1936           MRN: [id number] Visit Date: 03/04/2017              Requested by: Mar Daring, PA-C [address], [place] [address] PCP: Mar Daring, PA-C   Assessment & Plan: Visit Diagnoses: Carpal tunnel syndrome is severe by EMGs and nerve conduction studies  Plan: Long discussion regarding findings of  EMGs and nerve conduction studies. Jackie Turner would like to try a volar wrist splint and hold off on injections and/or surgery  Follow-Up Instructions: No Follow-up on file.   Orders:  No orders of the defined types were placed in this encounter.  No orders of the defined types were placed in this encounter.     Procedures: No procedures performed   Clinical Data: No additional findings.   Subjective: Chief Complaint  Patient presents with  . Left Wrist - Pain    .Patient is a very pleasant 81 year old white female who was seen on multiple occasions for evaluation of her left upper arm. She states about 2 weeks ago she started having pain in the area of the proximal third and mid third section of the humerus. She does not remember any history of injury or trauma. It just started aching like a toothache. She's tried many maneuvers to try to get it to stop hurting but it continued. She denies any numbness or tingling. Denies any shoulder pain. Denies any cervical spine pain. Applied left wrist splint for support.   He had EMGs and nerve conduction studies by Dr. Ernestina Turner on 02/28/2017. This study demonstrated a severe left median nerve entrapment at the wrist I carpal tunnel syndrome. It also demonstrated that the nerve had been compressed to the point that it was affecting both sensory and motor components.S He has had prior bilateral carpal tunnel releases over 20 years ago and apparently has redeveloped the symptoms on the left. She has had numbness and  tingling in the radial 3 digits. Hasn't really had any sleep deprivation  Review of Systems   Objective: Vital Signs: There were no vitals taken for this visit.  Physical Exam  Ortho Exam emanation of the left wrist and hand demonstrates positive Tinel's and Phalen's about the wrist. Significant degenerative change at the base of the left thumb with subluxation and some mild discomfort. Able to oppose thumb to little finger with the good pressure. Mild thenar atrophy. Good capillary refill to fingers. Painless range of motion of elbow and left shoulder  Specialty Comments:  No specialty comments available.  Imaging: No results found.   PMFS History: Patient Active Problem List   Diagnosis Date Noted  . Recurrent URI (upper respiratory infection) 10/31/2015  . Hypoxia 09/27/2015  . Abnormal CXR 09/27/2015  . Asthma with allergic rhinitis 08/31/2015  . Back ache 08/31/2015  . Chronic interstitial cystitis 08/31/2015  . Grief reaction 08/31/2015  . Hypercholesteremia 06/26/2015  . Migraine 06/26/2015  . Asthma 06/26/2015  . IBS (irritable bowel syndrome) 06/26/2015  . Osteoporosis 06/26/2015  . Fatigue 06/26/2015  . Lumbar radiculopathy 04/27/2013   Past Medical History:  Diagnosis Date  . Allergy   . Asthma   . Hyperlipidemia   . Osteoporosis     Family History  Problem Relation Age of Onset  . Heart disease Mother   . CAD Mother   .  Congestive Heart Failure Mother   . Osteoarthritis Mother   . Cancer Sister     breast  . Breast cancer Sister     Past Surgical History:  Procedure Laterality Date  . ABDOMINAL HYSTERECTOMY  2003  . BREAST BIOPSY  1976, 1978   benign  . carpal tunnel repair Bilateral    R 1990; L 1997  . EYE SURGERY Bilateral    cataract extraction  . HAND TENDON SURGERY Right    Trigger finger  . TONSILLECTOMY  1961   Social History   Occupational History  . Retired    Social History Main Topics  . Smoking status: Never Smoker  .  Smokeless tobacco: Never Used  . Alcohol use No  . Drug use: No  . Sexual activity: No

## 2017-03-06 ENCOUNTER — Other Ambulatory Visit: Payer: Self-pay | Admitting: Physician Assistant

## 2017-03-06 DIAGNOSIS — M810 Age-related osteoporosis without current pathological fracture: Secondary | ICD-10-CM

## 2017-03-06 NOTE — Telephone Encounter (Signed)
[facility]

## 2017-03-14 ENCOUNTER — Ambulatory Visit (INDEPENDENT_AMBULATORY_CARE_PROVIDER_SITE_OTHER): Payer: Medicare HMO | Admitting: Physician Assistant

## 2017-03-14 ENCOUNTER — Ambulatory Visit
Admission: RE | Admit: 2017-03-14 | Discharge: 2017-03-14 | Disposition: A | Discharge status: Home / Self Care | Payer: Medicare HMO | Source: Ambulatory Visit | Attending: Physician Assistant | Admitting: Physician Assistant

## 2017-03-14 ENCOUNTER — Telehealth: Payer: Self-pay

## 2017-03-14 ENCOUNTER — Encounter: Payer: Self-pay | Admitting: Physician Assistant

## 2017-03-14 VITALS — BP 100/60 | HR 81 | Temp 98.8°F | Resp 18 | Wt 128.0 lb

## 2017-03-14 DIAGNOSIS — J069 Acute upper respiratory infection, unspecified: Secondary | ICD-10-CM | POA: Diagnosis not present

## 2017-03-14 DIAGNOSIS — R7981 Abnormal blood-gas level: Secondary | ICD-10-CM

## 2017-03-14 DIAGNOSIS — J810 Acute pulmonary edema: Secondary | ICD-10-CM | POA: Diagnosis not present

## 2017-03-14 DIAGNOSIS — M546 Pain in thoracic spine: Secondary | ICD-10-CM

## 2017-03-14 DIAGNOSIS — J986 Disorders of diaphragm: Secondary | ICD-10-CM | POA: Insufficient documentation

## 2017-03-14 DIAGNOSIS — I517 Cardiomegaly: Secondary | ICD-10-CM | POA: Insufficient documentation

## 2017-03-14 DIAGNOSIS — M62830 Muscle spasm of back: Secondary | ICD-10-CM

## 2017-03-14 DIAGNOSIS — R05 Cough: Secondary | ICD-10-CM | POA: Diagnosis present

## 2017-03-14 DIAGNOSIS — J9811 Atelectasis: Secondary | ICD-10-CM | POA: Diagnosis not present

## 2017-03-14 DIAGNOSIS — R079 Chest pain, unspecified: Secondary | ICD-10-CM | POA: Diagnosis not present

## 2017-03-14 MED ORDER — PREDNISONE 10 MG (21) PO TBPK: ORAL_TABLET | ORAL | 0 refills | Status: DC

## 2017-03-14 MED ORDER — AZITHROMYCIN 250 MG PO TABS: ORAL_TABLET | ORAL | 0 refills | Status: DC

## 2017-03-14 MED ORDER — CYCLOBENZAPRINE HCL 5 MG PO TABS: 5.0000 mg | ORAL_TABLET | Freq: Every day | ORAL | 0 refills | Status: DC

## 2017-03-14 NOTE — Telephone Encounter (Signed)
-----   Message from Mar Daring, [place] sent at 03/14/2017  1:58 PM EDT ----- CXR is stable, no pneumonia. Continue treatment for muscle spasm as directed in the office. Call Monday if swelling does not improve with stopping Myrbetriq.

## 2017-03-14 NOTE — Patient Instructions (Signed)
Muscle Cramps and Spasms Muscle cramps and spasms are when muscles tighten by themselves. They usually get better within minutes. Muscle cramps are painful. They are usually stronger and last longer than muscle spasms. Muscle spasms may or may not be painful. They can last a few seconds or much longer. Follow these instructions at home:  Drink enough fluid to keep your pee (urine) clear or pale yellow.  Massage, stretch, and relax the muscle.  If directed, apply heat to tight or tense muscles as often as told by your doctor. Use the heat source that your doctor recommends.  Place a towel between your skin and the heat source.  Leave the heat on for 20-30 minutes.  Take off the heat if your skin turns bright red. This is especially important if you are unable to feel pain, heat, or cold. You may have a greater risk of getting burned.  If directed, put ice on the affected area. This may help if you are sore or have pain after a cramp or spasm.  Put ice in a plastic bag.  Place a towel between your skin and the bag.  Leave the ice on for 20 minutes, 2-3 times a day.  Take over-the-counter and prescription medicines only as told by your doctor.  Pay attention to any changes in your symptoms. Contact a doctor if:  Your cramps or spasms get worse or happen more often.  Your cramps or spasms do not get better with time. This information is not intended to replace advice given to you by your health care provider. Make sure you discuss any questions you have with your health care provider. Document Released: 11/14/2008 Document Revised: 01/03/2016 Document Reviewed: 09/05/2015 Elsevier Interactive Patient Education  2017 [employer].

## 2017-03-14 NOTE — Progress Notes (Addendum)
Patient: Jackie Turner Female    DOB: 1936-05-26   81 y.o.   MRN: [id number] Visit Date: 03/14/2017  Today's Provider: Mar Daring, PA-C   Chief Complaint  Patient presents with  . Back Pain   Subjective:    HPI Patient reports that she mowed 2 weeks ago and right side midline of her back started to hurt a few days after. She thought it was because of the mowing she had done. Her back pain is worsening and it hurst when she takes deep breath. She reports the pain is worse with movement.She reports using the tramadol more frequent for the pain.She feel she is having side effect from the Myrbetriq. She has been having trouble breathing and is worsening and leg swelling, ankles and feet.she reports she stopped the Myrbetriq 2 days ago.     Allergies  Allergen Reactions  . Nsaids Other (See Comments)    History of gastric ulcer  . Amoxicillin Nausea Only and Rash  . Ibandronic Acid Other (See Comments)    Muscle weakness - advised not to take  . Actonel  [Risedronate] Other (See Comments)    Gastric ulcers  . Antihistamines, Chlorpheniramine-Type   . Aspirin     Gastric ulcer  . Butalbital-Aspirin-Caffeine Other (See Comments)    Other reaction(s): Unknown  . Clarithromycin Other (See Comments)    Bloating Bloating Bloating  . Codeine   . Gatifloxacin Other (See Comments)  . Moxifloxacin   . Penicillins Other (See Comments)  . Risedronate Sodium     Gastric ulcer  . Tylenol With Codeine #3  [Acetaminophen-Codeine]     Other reaction(s): Unknown  . Raloxifene Other (See Comments)    Bloating bloating     Current Outpatient Prescriptions:  .  Calcium Carbonate-Vitamin D 600-400 MG-UNIT per tablet, Take 1 tablet by mouth 2 (two) times daily. , Disp: , Rfl:  .  dicyclomine (BENTYL) 10 MG capsule, Take 1 capsule (10 mg total) by mouth 3 (three) times daily as needed for spasms., Disp: 120 capsule, Rfl: 5 .  doxepin (SINEQUAN) 10 MG capsule, TAKE 1  CAPSULE BY MOUTH EVERY DAY., Disp: 90 capsule, Rfl: 1 .  doxepin (SINEQUAN) 50 MG capsule, TAKE 1 CAPSULE BY MOUTH EVERY DAY., Disp: 30 capsule, Rfl: 5 .  fluticasone (FLONASE) 50 MCG/ACT nasal spray, Place 2 sprays into both nostrils daily. , Disp: , Rfl:  .  Fluticasone-Salmeterol (ADVAIR) 250-50 MCG/DOSE AEPB, Inhale 1 puff into the lungs 2 (two) times daily., Disp: 60 each, Rfl: 5 .  montelukast (SINGULAIR) 10 MG tablet, Take 1 tablet (10 mg total) by mouth at bedtime., Disp: 30 tablet, Rfl: 6 .  ULTRAM 50 MG tablet, TAKE 1 TO 2 TABLETS BY MOUTH AS NEEDED, Disp: 60 tablet, Rfl: 5 .  VENTOLIN HFA 108 (90 Base) MCG/ACT inhaler, INHALE TWO PUFFS INTO THE LUNGS EVERY 4 HOURS AS NEEDED FOR WHEEZING OR SHORTNESS OF BREATH, Disp: 18 g, Rfl: 3 .  verapamil (CALAN-SR) 180 MG CR tablet, Take 0.5 tablets (90 mg total) by mouth daily., Disp: 45 tablet, Rfl: 1 .  loratadine (CLARITIN) 10 MG tablet, Take by mouth., Disp: , Rfl:  .  mirabegron ER (MYRBETRIQ) 25 MG TB24 tablet, Take 25 mg by mouth., Disp: , Rfl:  .  nystatin (MYCOSTATIN) 100000 UNIT/ML suspension, Use as directed 5 mLs (500,000 Units total) in the mouth or throat 2 (two) times daily. (Patient not taking: Reported on 11/04/2016), Disp: 60 mL, Rfl:  1 .  pentosan polysulfate (ELMIRON) 100 MG capsule, Take 200 mg by mouth 2 (two) times daily. , Disp: , Rfl:  .  Tiotropium Bromide Monohydrate (SPIRIVA RESPIMAT) 1.25 MCG/ACT AERS, Inhale 1.25 mcg into the lungs 2 (two) times daily. (Patient not taking: Reported on 03/14/2017), Disp: 4 g, Rfl: 3  Review of Systems  Constitutional: Negative for fatigue and fever.  HENT: Negative.   Respiratory: Positive for shortness of breath. Negative for cough, chest tightness and wheezing.   Cardiovascular: Positive for leg swelling. Negative for chest pain and palpitations.  Gastrointestinal: Negative for abdominal pain and nausea.  Musculoskeletal: Positive for back pain.  Neurological: Negative.     Psychiatric/Behavioral: Negative.     Social History  Substance Use Topics  . Smoking status: Never Smoker  . Smokeless tobacco: Never Used  . Alcohol use No   Objective:   BP 100/60 (BP Location: Left Arm, Patient Position: Sitting, Cuff Size: Normal)   Pulse 81   Temp 98.8 F (37.1 C) (Oral)   Resp 18   Wt 128 lb (58.1 kg)   SpO2 (!) 89%   BMI 24.19 kg/m    Physical Exam  Constitutional: She appears well-developed and well-nourished. No distress.  Neck: Normal range of motion. Neck supple. No JVD present. No tracheal deviation present. No thyromegaly present.  Cardiovascular: Normal rate and regular rhythm.  Exam reveals no gallop and no friction rub.   Murmur heard.  Systolic murmur is present with a grade of 2/6  Pulmonary/Chest: Effort normal and breath sounds normal. No respiratory distress. She has no wheezes. She has no rales.  Musculoskeletal: She exhibits edema (1+ pitting noted on feet and ankles).       Thoracic back: She exhibits decreased range of motion, tenderness and spasm. She exhibits no bony tenderness.       Back:  Lymphadenopathy:    She has no cervical adenopathy.  Skin: She is not diaphoretic.  Vitals reviewed.      Assessment & Plan:     1. Acute right-sided thoracic back pain Suspect back pain to be secondary to muscle spasm noted on exam. I feel patient is not taking a deep breath due to back pain causing low oxygen levels. Will treat with prednisone and flexeril as below. Will also get CXR to make sure patient does not have any lung changes or cause such as pneumonia. Zpak given as below printed for patient to fill if CXR shows abnormalities. I will f/u with patient once CXR results received. I think lower extremity edema is caused from Myrbetriq because patient started just last week and developed difficulty urinating, decreased water intake and edema all following starting that medication. She is to discontinue this x 1 week and call if  swelling does not improve by Monday 03/17/17. - predniSONE (STERAPRED UNI-PAK 21 TAB) 10 MG (21) TBPK tablet; Take as directed on package instructions  Dispense: 21 tablet; Refill: 0 - cyclobenzaprine (FLEXERIL) 5 MG tablet; Take 1 tablet (5 mg total) by mouth at bedtime.  Dispense: 30 tablet; Refill: 0 - DG Chest 2 View; Future  2. Muscle spasm of back See above medical treatment plan. - cyclobenzaprine (FLEXERIL) 5 MG tablet; Take 1 tablet (5 mg total) by mouth at bedtime.  Dispense: 30 tablet; Refill: 0  3. Upper respiratory tract infection, unspecified type See above medical treatment plan. - azithromycin (ZITHROMAX) 250 MG tablet; Take 2 tablets PO on day one, and one tablet PO daily thereafter until completed.  Dispense: 6 tablet; Refill: 0  4. Low O2 saturation See above medical treatment plan. - DG Chest 2 View; Future       Mar Daring, PA-C  [facility]

## 2017-03-14 NOTE — Telephone Encounter (Signed)
Patient advised as directed below.  Thanks,  -Joseline 

## 2017-03-18 ENCOUNTER — Telehealth: Payer: Self-pay

## 2017-03-18 NOTE — Telephone Encounter (Signed)
Patient calling requesting more Ultram since she is using it for her back pain or if something different can be send.She reports using the Flexeril. She also stated her back is getting a little better and yesterday it was much better but for some reason today it feels bad.she has been alternating the heating pad and ice but has decided not to continue with that this afternoon. She wants to know what else she can do because Ibuprofen doesn't help and she is afraid she is going to run out of the Ultram which is for her headaches but is helping with her back. She reports her breathing and back are better than when she was in on Friday, but still feels she needs something for her pain. Pt was advised Tawanna Sat not in this afternoon and she reports is not urgent it can wait for tomorrow. :)  Please advise.  Thanks,  -Vasil Juhasz

## 2017-03-18 NOTE — Telephone Encounter (Signed)
Can we see how often she is taking the ultram? Her current Rx has 5 refills, but depends on how often she is taking it currently.

## 2017-03-19 NOTE — Telephone Encounter (Signed)
OK to call pharmacy and increase ultram 50mg  to quantity 90 x 1 month for TID use then decrease back to the Rx refills that will be on file for BID use.

## 2017-03-19 NOTE — Telephone Encounter (Signed)
Prescription called into pharmacy. Patient advised and verbally voiced understanding.

## 2017-03-19 NOTE — Telephone Encounter (Signed)
Patient states she has been taking Ultram 1 pill 3 times a day.

## 2017-03-21 DIAGNOSIS — M48061 Spinal stenosis, lumbar region without neurogenic claudication: Secondary | ICD-10-CM | POA: Diagnosis not present

## 2017-03-21 DIAGNOSIS — M5417 Radiculopathy, lumbosacral region: Secondary | ICD-10-CM | POA: Diagnosis not present

## 2017-03-24 ENCOUNTER — Ambulatory Visit (INDEPENDENT_AMBULATORY_CARE_PROVIDER_SITE_OTHER): Payer: Medicare HMO | Admitting: Orthopaedic Surgery

## 2017-03-26 ENCOUNTER — Other Ambulatory Visit: Payer: Self-pay | Admitting: Physician Assistant

## 2017-03-26 DIAGNOSIS — B372 Candidiasis of skin and nail: Secondary | ICD-10-CM

## 2017-03-26 DIAGNOSIS — B370 Candidal stomatitis: Secondary | ICD-10-CM

## 2017-03-26 NOTE — Telephone Encounter (Signed)
Pt contacted office for refill request on the following medications:  nystatin (MYCOSTATIN) 100000 UNIT/ML suspension.  [facility].  CB#[phone]/MW

## 2017-03-28 MED ORDER — NYSTATIN 100000 UNIT/ML MT SUSP: 5.0000 mL | Freq: Two times a day (BID) | OROMUCOSAL | 1 refills | Status: DC

## 2017-03-28 NOTE — Telephone Encounter (Signed)
Pt called back wants to know status of request for nystatin

## 2017-03-28 NOTE — Telephone Encounter (Signed)
Looks like it had been sent on 4/11 but I just resent

## 2017-03-28 NOTE — Telephone Encounter (Signed)
Pt advised. Soffia Doshier Drozdowski, CMA  

## 2017-03-29 NOTE — Telephone Encounter (Signed)
Ok to rf--check with JB

## 2017-04-09 ENCOUNTER — Other Ambulatory Visit: Payer: Self-pay | Admitting: Physician Assistant

## 2017-04-09 ENCOUNTER — Ambulatory Visit (INDEPENDENT_AMBULATORY_CARE_PROVIDER_SITE_OTHER): Payer: Medicare HMO | Admitting: Physician Assistant

## 2017-04-09 ENCOUNTER — Encounter: Payer: Self-pay | Admitting: Physician Assistant

## 2017-04-09 VITALS — BP 140/82 | HR 67 | Temp 98.2°F | Wt 124.8 lb

## 2017-04-09 DIAGNOSIS — M810 Age-related osteoporosis without current pathological fracture: Secondary | ICD-10-CM

## 2017-04-09 DIAGNOSIS — M62830 Muscle spasm of back: Secondary | ICD-10-CM | POA: Diagnosis not present

## 2017-04-09 DIAGNOSIS — B370 Candidal stomatitis: Secondary | ICD-10-CM | POA: Diagnosis not present

## 2017-04-09 DIAGNOSIS — M546 Pain in thoracic spine: Secondary | ICD-10-CM

## 2017-04-09 DIAGNOSIS — T300 Burn of unspecified body region, unspecified degree: Secondary | ICD-10-CM | POA: Diagnosis not present

## 2017-04-09 DIAGNOSIS — J4530 Mild persistent asthma, uncomplicated: Secondary | ICD-10-CM

## 2017-04-09 MED ORDER — CYCLOBENZAPRINE HCL 5 MG PO TABS: 5.0000 mg | ORAL_TABLET | Freq: Every day | ORAL | 0 refills | Status: DC

## 2017-04-09 MED ORDER — NYSTATIN 100000 UNIT/ML MT SUSP: 5.0000 mL | Freq: Two times a day (BID) | OROMUCOSAL | 1 refills | Status: DC

## 2017-04-09 MED ORDER — PREDNISONE 10 MG (21) PO TBPK: ORAL_TABLET | ORAL | 0 refills | Status: DC

## 2017-04-09 MED ORDER — ULTRAM 50 MG PO TABS
ORAL_TABLET | ORAL | 1 refills | Status: DC
Start: 2017-04-09 — End: 2017-05-19

## 2017-04-09 NOTE — Progress Notes (Signed)
Patient: Jackie Turner Female    DOB: 12/24/1935   81 y.o.   MRN: [id number] Visit Date: 04/09/2017  Today's Provider: Mar Daring, PA-C   Chief Complaint  Patient presents with  . Back Pain   Subjective:    Back Pain  This is a recurrent problem. Episode onset: this episode started on Monday. The problem occurs constantly. The problem is unchanged. The pain is present in the lumbar spine. The quality of the pain is described as stabbing. Radiates to: other side of back. The pain is at a severity of 10/10. The pain is the same all the time. The symptoms are aggravated by lying down. Stiffness is present all day. (Constipation ) Treatments tried: Ultram and Flexeril. The treatment provided no relief.   Constipation has worsened with flexeril use per patient. She has tried laxatives but has not yet had a BM. She is going to try magnesium citrate this evening and will call if still no BM. She does also report taking an old Rx of her husband's percocet which could have also worsened the constipation.   Back pain has no inciting event. She reports awakening with a sharp mid back pain radiating across left and right back. She has taken a few flexeril that has helped some. She has been using heating pad with some relief.    Previous Medications   AZITHROMYCIN (ZITHROMAX) 250 MG TABLET    Take 2 tablets PO on day one, and one tablet PO daily thereafter until completed.   CALCIUM CARBONATE-VITAMIN D 600-400 MG-UNIT PER TABLET    Take 1 tablet by mouth 2 (two) times daily.    DICYCLOMINE (BENTYL) 10 MG CAPSULE    Take 1 capsule (10 mg total) by mouth 3 (three) times daily as needed for spasms.   DOXEPIN (SINEQUAN) 10 MG CAPSULE    TAKE 1 CAPSULE BY MOUTH EVERY DAY.   DOXEPIN (SINEQUAN) 50 MG CAPSULE    TAKE 1 CAPSULE BY MOUTH EVERY DAY.   FLUTICASONE (FLONASE) 50 MCG/ACT NASAL SPRAY    Place 2 sprays into both nostrils daily.    FLUTICASONE-SALMETEROL (ADVAIR) 250-50 MCG/DOSE AEPB     Inhale 1 puff into the lungs 2 (two) times daily.   LORATADINE (CLARITIN) 10 MG TABLET    Take by mouth.   MIRABEGRON ER (MYRBETRIQ) 25 MG TB24 TABLET    Take 25 mg by mouth.   MONTELUKAST (SINGULAIR) 10 MG TABLET    Take 1 tablet (10 mg total) by mouth at bedtime.   PENTOSAN POLYSULFATE (ELMIRON) 100 MG CAPSULE    Take 200 mg by mouth 2 (two) times daily.    TIOTROPIUM BROMIDE MONOHYDRATE (SPIRIVA RESPIMAT) 1.25 MCG/ACT AERS    Inhale 1.25 mcg into the lungs 2 (two) times daily.   VENTOLIN HFA 108 (90 BASE) MCG/ACT INHALER    INHALE TWO PUFFS INTO THE LUNGS EVERY 4 HOURS AS NEEDED FOR WHEEZING OR SHORTNESS OF BREATH   VERAPAMIL (CALAN-SR) 180 MG CR TABLET    Take 0.5 tablets (90 mg total) by mouth daily.    Review of Systems  Constitutional: Negative.   Respiratory: Negative.   Cardiovascular: Negative.   Gastrointestinal: Positive for constipation.  Musculoskeletal: Positive for back pain. Negative for gait problem and myalgias.    Social History  Substance Use Topics  . Smoking status: Never Smoker  . Smokeless tobacco: Never Used  . Alcohol use No   Objective:   BP 140/82 (BP Location: Right Arm, Patient Position:  Sitting, Cuff Size: Normal)   Pulse 67   Temp 98.2 F (36.8 C) (Oral)   Wt 124 lb 12.8 oz (56.6 kg)   SpO2 90%   BMI 23.58 kg/m   Physical Exam  Constitutional: She appears well-developed and well-nourished. No distress.  Neck: Normal range of motion. Neck supple.  Cardiovascular: Normal rate, regular rhythm and normal heart sounds.  Exam reveals no gallop and no friction rub.   No murmur heard. Pulmonary/Chest: Effort normal and breath sounds normal. No respiratory distress. She has no wheezes. She has no rales.  Musculoskeletal:       Thoracic back: She exhibits normal range of motion, no tenderness, no bony tenderness and no spasm.       Lumbar back: She exhibits decreased range of motion. She exhibits no tenderness, no bony tenderness and no spasm.  Skin:  Burn noted. She is not diaphoretic.     Vitals reviewed.      Assessment & Plan:     1. Acute right-sided thoracic back pain Unsure of cause and patient was able to bend over to pick things up in the office that she had dropped on the floor without issue. Paraspinal muscles were tight bilaterally but no tenderness or specific trigger area. SubQ burn from heating pad noted. Advised patient to not use heating pad or make sure to cover the heating pad, not apply directly to skin. She is willing to use prednisone and flexeril again as this worked on the previous area. This has been filled as below. Back exercises given to patient. She is to f/u in 2 weeks or sooner if needed for worsening pain or no improvement and will obtain thoracic spine xray.  - predniSONE (STERAPRED UNI-PAK 21 TAB) 10 MG (21) TBPK tablet; Take as directed on package instructions  Dispense: 21 tablet; Refill: 0 - cyclobenzaprine (FLEXERIL) 5 MG tablet; Take 1 tablet (5 mg total) by mouth at bedtime.  Dispense: 30 tablet; Refill: 0  2. Muscle spasm of back See above medical treatment plan. - cyclobenzaprine (FLEXERIL) 5 MG tablet; Take 1 tablet (5 mg total) by mouth at bedtime.  Dispense: 30 tablet; Refill: 0  3. Age-related osteoporosis without current pathological fracture See above medical treatment plan. - ULTRAM 50 MG tablet; Take 1 tab PO q 8 hours prn pain  Dispense: 90 tablet; Refill: 1  4. Thrush Secondary to oral prednisone. Will refill nystatin susp as below.  - nystatin (MYCOSTATIN) 100000 UNIT/ML suspension; Use as directed 5 mLs (500,000 Units total) in the mouth or throat 2 (two) times daily.  Dispense: 60 mL; Refill: 1  5. Burn See above medical treatment plan for #1.   Follow up: Return in about 2 weeks (around 04/23/2017) for thoracic back pain.

## 2017-04-09 NOTE — Patient Instructions (Signed)

## 2017-04-10 ENCOUNTER — Telehealth: Payer: Self-pay | Admitting: *Deleted

## 2017-04-10 NOTE — Telephone Encounter (Signed)
Pt states she is being followed by jennifer barnett for her breathing issues and that she will have them to fill the Ventolin for her and not make an appt at this time. Nothing further needed.

## 2017-04-10 NOTE — Telephone Encounter (Signed)
LMOVM for pt to return call due to she has a refill request and she needs to be seen. Will await call back.

## 2017-04-11 ENCOUNTER — Telehealth: Payer: Self-pay | Admitting: Physician Assistant

## 2017-04-11 NOTE — Telephone Encounter (Signed)
error 

## 2017-04-14 ENCOUNTER — Telehealth: Payer: Self-pay | Admitting: Physician Assistant

## 2017-04-14 DIAGNOSIS — M549 Dorsalgia, unspecified: Secondary | ICD-10-CM

## 2017-04-14 MED ORDER — HYDROCODONE-ACETAMINOPHEN 5-325 MG PO TABS: 1.0000 | ORAL_TABLET | Freq: Three times a day (TID) | ORAL | 0 refills | Status: DC | PRN

## 2017-04-14 NOTE — Telephone Encounter (Signed)
Pt called back with her back hurting really bad.  She wants to know if someone will call her back and let her know what Tawanna Sat wants to do.  Her call back is [phone]  Thanks teri

## 2017-04-14 NOTE — Telephone Encounter (Signed)
Pt states she seen Tawanna Sat last Wednesday for back pain.  Pt states she is not any better and maybe worse.  Pt states the Rx for pain is not helping at all.  Pt is asking if an antibiotic will help with this.  Please advise.  CB#[phone]/MW

## 2017-04-14 NOTE — Telephone Encounter (Signed)
Started Flexeril, Prednisone and Tramadol. Please review. Renaldo Fiddler, CMA

## 2017-04-14 NOTE — Telephone Encounter (Signed)
Spoke with Jackie Turner and she reports that her pain is in the lower back. No dysuria. She reports that she has not been to the bathroom for the past three days. She started the Flexeril, Prednisone and the Tramadol she reports it doesn't touch it. She had some Vicodin left and took some, but the pain is not any better. She feels because of her pain she is having more SOB. She wants to know what else can she do.  Thanks,   -Joseline

## 2017-04-14 NOTE — Telephone Encounter (Signed)
Has she had any symptoms change or move? I don't know if an antibiotic would help with what she was having going on with the back. I can go ahead and order the spinal xray like we had discussed to see if anything is structurally wrong. Is the burn healing?

## 2017-04-14 NOTE — Telephone Encounter (Signed)
Patient advised as directed below. Per patient she is already taking Vicodin 10-325 MG and is not helping. She reports she has been taking the Vicodin all day today. The Vicodin used to belong to her deceased husband. She reports pain is getting worse and worse. Per Tawanna Sat since pain is too much and she has been taking a stronger Vicodin she needs to be seen at the ED for something faster to help with the pain and she can get the x-rays done. Patient agree.  FYI: Earlier patient's sister Harmon Pier called requesting for [place] to call patient back because her sister is not a "sissy" and for her to call to complain to her about her pain, is because is a severe pain.   Thanks,  -Yahye Siebert

## 2017-04-14 NOTE — Telephone Encounter (Signed)
Will do low dose vicodin and will order spinal xrays

## 2017-04-15 ENCOUNTER — Encounter: Payer: Self-pay | Admitting: Emergency Medicine

## 2017-04-15 ENCOUNTER — Emergency Department
Admission: EM | Admit: 2017-04-15 | Discharge: 2017-04-15 | Disposition: A | Discharge status: Home / Self Care | Payer: Medicare HMO | Attending: Emergency Medicine | Admitting: Emergency Medicine

## 2017-04-15 DIAGNOSIS — Y929 Unspecified place or not applicable: Secondary | ICD-10-CM | POA: Insufficient documentation

## 2017-04-15 DIAGNOSIS — Y9389 Activity, other specified: Secondary | ICD-10-CM | POA: Diagnosis not present

## 2017-04-15 DIAGNOSIS — Z79899 Other long term (current) drug therapy: Secondary | ICD-10-CM | POA: Diagnosis not present

## 2017-04-15 DIAGNOSIS — R0682 Tachypnea, not elsewhere classified: Secondary | ICD-10-CM

## 2017-04-15 DIAGNOSIS — S29012A Strain of muscle and tendon of back wall of thorax, initial encounter: Secondary | ICD-10-CM | POA: Insufficient documentation

## 2017-04-15 DIAGNOSIS — S20411A Abrasion of right back wall of thorax, initial encounter: Secondary | ICD-10-CM | POA: Insufficient documentation

## 2017-04-15 DIAGNOSIS — Y999 Unspecified external cause status: Secondary | ICD-10-CM | POA: Insufficient documentation

## 2017-04-15 DIAGNOSIS — J45909 Unspecified asthma, uncomplicated: Secondary | ICD-10-CM | POA: Insufficient documentation

## 2017-04-15 DIAGNOSIS — X501XXA Overexertion from prolonged static or awkward postures, initial encounter: Secondary | ICD-10-CM | POA: Insufficient documentation

## 2017-04-15 DIAGNOSIS — S299XXA Unspecified injury of thorax, initial encounter: Secondary | ICD-10-CM | POA: Diagnosis present

## 2017-04-15 DIAGNOSIS — R062 Wheezing: Secondary | ICD-10-CM | POA: Insufficient documentation

## 2017-04-15 MED ORDER — METHYLPREDNISOLONE 4 MG PO TABS: ORAL_TABLET | ORAL | 0 refills | Status: DC

## 2017-04-15 MED ORDER — OXYCODONE HCL 5 MG PO TABS: 5.0000 mg | ORAL_TABLET | ORAL | 0 refills | Status: DC | PRN

## 2017-04-15 MED ORDER — KETOROLAC TROMETHAMINE 60 MG/2ML IM SOLN: 60.0000 mg | Freq: Once | INTRAMUSCULAR | Status: DC

## 2017-04-15 MED ORDER — OXYCODONE-ACETAMINOPHEN 5-325 MG PO TABS
1.0000 | ORAL_TABLET | Freq: Once | ORAL | Status: AC
  Administered 2017-04-15: 1 via ORAL
  Filled 2017-04-15: qty 1

## 2017-04-15 MED ORDER — KETOROLAC TROMETHAMINE 60 MG/2ML IM SOLN
30.0000 mg | Freq: Once | INTRAMUSCULAR | Status: AC
  Administered 2017-04-15: 30 mg via INTRAMUSCULAR
  Filled 2017-04-15: qty 2

## 2017-04-15 MED ORDER — IPRATROPIUM-ALBUTEROL 0.5-2.5 (3) MG/3ML IN SOLN
3.0000 mL | Freq: Once | RESPIRATORY_TRACT | Status: AC
Start: 2017-04-15 — End: 2017-04-15
  Administered 2017-04-15: 3 mL via RESPIRATORY_TRACT
  Filled 2017-04-15: qty 3

## 2017-04-15 MED ORDER — CYCLOBENZAPRINE HCL 5 MG PO TABS: 5.0000 mg | ORAL_TABLET | Freq: Three times a day (TID) | ORAL | 0 refills | Status: DC | PRN

## 2017-04-15 NOTE — ED Notes (Signed)
Dr Mariea Clonts at bedside for evaluation

## 2017-04-15 NOTE — Discharge Instructions (Signed)
For your muscle strain, you may use Tylenol for mild to moderate pain, Flexeril for muscle spasm, and oxycodone for severe pain. Do not drive within 8 hours of taking oxycodone. Please take precautions to prevent falls when you're taking oxycodone, as this medication is a narcotic and can make you sleepy or unsteady on your feet.  Please apply a thick coat of a triple antibiotic cream, such as Neosporin, over the abrasions on your back 3 times daily until they have completely healed.  Return to the emergency department if you develop severe pain, fever, numbness tingling or weakness, rash, fever, or any other symptoms concerning to you.

## 2017-04-15 NOTE — ED Provider Notes (Signed)
[facility] Emergency Department Provider Note  ____________________________________________  Time seen: Approximately 8:38 AM  I have reviewed the triage vital signs and the nursing notes.   HISTORY  Chief Complaint Back Pain    HPI Wrenna Constantina Laseter is a 81 y.o. female with a history of osteoporosis, moderate persistent asthma, presenting with right-sided back strain. The patient reports that several days ago, she was pulling on a lawnmower cord when she had an acute onset of a right-sided back pain. She had a similar back pain for which she was treated by her primary care physician on 3/30 after gardening with Ultram, steroids, and Flexeril. Over the last several days, the patient has been taking Norco without improvement. She has used a heating pad which caused a small burn with blisters, which are healing. She denies any numbness, tingling, weakness, urinary or fecal incontinence or retention, pleuritic chest pain. She does note that the pain makes it more difficult for her to take breaths and that the pain is also worse with positional changes. No cough, fevers.   Past Medical History:  Diagnosis Date  . Allergy   . Asthma   . Hyperlipidemia   . Osteoporosis     Patient Active Problem List   Diagnosis Date Noted  . Recurrent URI (upper respiratory infection) 10/31/2015  . Hypoxia 09/27/2015  . Abnormal CXR 09/27/2015  . Asthma with allergic rhinitis 08/31/2015  . Back ache 08/31/2015  . Chronic interstitial cystitis 08/31/2015  . Grief reaction 08/31/2015  . Hypercholesteremia 06/26/2015  . Migraine 06/26/2015  . Asthma 06/26/2015  . IBS (irritable bowel syndrome) 06/26/2015  . Osteoporosis 06/26/2015  . Fatigue 06/26/2015  . Lumbar radiculopathy 04/27/2013  . Urinary urgency 01/08/2012  . Increased frequency of urination 01/08/2012    Past Surgical History:  Procedure Laterality Date  . ABDOMINAL HYSTERECTOMY  2003  . BREAST BIOPSY   1976, 1978   benign  . carpal tunnel repair Bilateral    R 1990; L 1997  . EYE SURGERY Bilateral    cataract extraction  . HAND TENDON SURGERY Right    Trigger finger  . TONSILLECTOMY  1961    Current Outpatient Rx  . Order #: [id number] Class: Print  . Order #: [id number] Class: Historical Med  . Order #: [id number] Class: Print  . Order #: [id number] Class: Normal  . Order #: [id number] Class: Normal  . Order #: [id number] Class: Normal  . Order #: [id number] Class: Historical Med  . Order #: [id number] Class: Normal  . Order #: [id number] Class: Print  . Order #: [id number] Class: Historical Med  . Order #: [id number] Class: Print  . Order #: [id number] Class: Historical Med  . Order #: [id number] Class: Normal  . Order #: [id number] Class: Normal  . Order #: [id number] Class: Print  . Order #: [id number] Class: Historical Med  . Order #: [id number] Class: Normal  . Order #: [id number] Class: Normal  . Order #: [id number] Class: Print  . Order #: [id number] Class: Normal  . Order #: [id number] Class: Normal    Allergies Nsaids; Amoxicillin; Ibandronic acid; Actonel  [risedronate]; Antihistamines, chlorpheniramine-type; Aspirin; Butalbital-aspirin-caffeine; Clarithromycin; Codeine; Gatifloxacin; Moxifloxacin; Penicillins; Risedronate sodium; Tylenol with codeine #3  [acetaminophen-codeine]; and Raloxifene  Family History  Problem Relation Age of Onset  . Heart disease Mother   . CAD Mother   . Congestive Heart Failure Mother   . Osteoarthritis Mother   . Cancer Sister     breast  . Breast cancer Sister     Social History Social History  Substance Use Topics  . Smoking status: Never Smoker  .  Smokeless tobacco: Never Used  . Alcohol use No    Review of Systems Constitutional: No fever/chills.No lightheadedness or syncope. Eyes: No visual changes. ENT:  No congestion or rhinorrhea. No neck pain. Cardiovascular: Denies chest pain. Denies palpitations. Respiratory: Denies shortness of breath but  back pain is worse with deep breaths.  No cough. Gastrointestinal: No abdominal pain.  No nausea, no vomiting.  No diarrhea.  No constipation. Genitourinary: Negative for dysuria. Musculoskeletal: Positive for right-sided back pain. Skin: Positive for first degree burn with healing blisters from a heating pad on the right-sided back. Neurological: Negative for headaches. No focal numbness, tingling or weakness. No urinary or fecal incontinence or retention. No difficulty walking.  10-point ROS otherwise negative.  ____________________________________________   PHYSICAL EXAM:  VITAL SIGNS: ED Triage Vitals  Enc Vitals Group     BP 04/15/17 0757 (!) 177/159     Pulse Rate 04/15/17 0752 (!) 101     Resp 04/15/17 0752 20     Temp --      Temp Source 04/15/17 0752 Oral     SpO2 04/15/17 0752 96 %     Weight 04/15/17 0753 124 lb (56.2 kg)     Height --      Head Circumference --      Peak Flow --      Pain Score 04/15/17 0752 9     Pain Loc --      Pain Edu? --      Excl. in [place]? --     Constitutional: Alert and oriented. Answers questions appropriately.Chronically ill appearing and mildly uncomfortable with positional changes but nontoxic. Eyes: Conjunctivae are normal.  EOMI. No scleral icterus. No eye discharge. Head: Atraumatic. Nose: No congestion/rhinnorhea. Mouth/Throat: Mucous membranes are moist.  Neck: No stridor.  Supple.  No midline C-spine tenderness to palpation, step-offs or deformities. Cardiovascular: Normal rate, regular rhythm. No murmurs, rubs or gallops.  Respiratory: Tachypnea with mild accessory muscle use or retractions. Positive end expiratory wheezing without rales or rhonchi. Gastrointestinal: Overweight. Soft, nontender and nondistended.  No guarding or rebound.  No peritoneal signs. Musculoskeletal: No LE edema. No midline thoracic or lumbar spine tenderness to palpation, step-offs or deformities. The patient has a reproducible area of back pain below  the right scapula, that reproduces with palpation as well as lateral movements. Neurologic:  A&Ox3.  Speech is clear.  Face and smile are symmetric.  EOMI.  Moves all extremities well. Normal gait without ataxia. Skin:  Skin is warm, dry. Below the right scapula, the patient has 2 healing blisters without overlying erythema, fluctuance, or purulence. Psychiatric: Mood and affect are normal. Speech and behavior are normal.  Normal judgement.  ____________________________________________   LABS (all labs ordered are listed, but only abnormal results are displayed)  Labs Reviewed - No data to display ____________________________________________  EKG  Not indicated ____________________________________________  RADIOLOGY  No results found.  ____________________________________________   PROCEDURES  Procedure(s) performed: None  Procedures  Critical Care performed: No ____________________________________________   INITIAL IMPRESSION / ASSESSMENT AND PLAN / ED COURSE  Pertinent labs & imaging results that were available during my care of the patient were reviewed by me and considered in my medical decision making (see chart for details).  81 y.o. female with right-sided back pain that is worse with positional changes after pulling on a lawnmower cord several days ago, and not responding to [place]. Overall, the patient's symptoms are most consistent with a musculoskeletal strain. Her pain is exacerbating her asthma  and making it difficult for her to complete her inhaler treatments at home, so we'll give her DuoNeb here. She does not have signs or symptoms that would be consistent with pneumonia. I do not think her back pain is from PE. Aortic pathology is very unlikely for this patient's pain. There is nothing in her abdomen that would be suggestive of a pancreatic cause for her back pain. We will initiate symptomatic treatment with steroids, Tylenol for mild to moderate pain, Flexeril  for muscle spasms and oxycodone as needed for severe pain.  She is not a candidate for prolonged NSAIDs due to a hx of gastric ulcers, but I will give her a one time, half dose of Toradol here (last creatinine 0.71). She understands the importance of close PMD follow-up, and return precautions were discussed.  ____________________________________________  FINAL CLINICAL IMPRESSION(S) / ED DIAGNOSES  Final diagnoses:  Muscle strain of right upper back, initial encounter  Abrasion of right side of back, initial encounter  Wheezing  Tachypnea         NEW MEDICATIONS STARTED DURING THIS VISIT:  New Prescriptions   CYCLOBENZAPRINE (FLEXERIL) 5 MG TABLET    Take 1 tablet (5 mg total) by mouth 3 (three) times daily as needed for muscle spasms.   METHYLPREDNISOLONE (MEDROL) 4 MG TABLET    Day 1: 8 mg PO before breakfast, 4 mg after lunch and after dinner, and 8 mg at bedtime   Day 2: 4 mg PO before breakfast, after lunch, and after dinner and 8 mg at bedtime  Day 3: 4 mg PO before breakfast, after lunch, after dinner, and at bedtime  Day 4: 4 mg PO before breakfast, after lunch, and at bedtime   Day 5: 4 mg PO before breakfast and at bedtime  Day 6: 4 mg PO before breakfast   OXYCODONE (ROXICODONE) 5 MG IMMEDIATE RELEASE TABLET    Take 1 tablet (5 mg total) by mouth every 4 (four) hours as needed for severe pain.      Eula Listen, MD 04/15/17 [phone]

## 2017-04-15 NOTE — ED Triage Notes (Signed)
Pt presents with low back pain. Pt ambulatory to triage with no difficulty noted.

## 2017-04-16 ENCOUNTER — Encounter: Payer: Self-pay | Admitting: Emergency Medicine

## 2017-04-16 ENCOUNTER — Emergency Department
Admission: EM | Admit: 2017-04-16 | Discharge: 2017-04-16 | Disposition: A | Discharge status: Home / Self Care | Payer: Medicare HMO | Attending: Emergency Medicine | Admitting: Emergency Medicine

## 2017-04-16 ENCOUNTER — Emergency Department: Payer: Medicare HMO

## 2017-04-16 DIAGNOSIS — R140 Abdominal distension (gaseous): Secondary | ICD-10-CM | POA: Diagnosis present

## 2017-04-16 DIAGNOSIS — J45909 Unspecified asthma, uncomplicated: Secondary | ICD-10-CM | POA: Diagnosis not present

## 2017-04-16 DIAGNOSIS — K6389 Other specified diseases of intestine: Secondary | ICD-10-CM | POA: Diagnosis not present

## 2017-04-16 DIAGNOSIS — K5900 Constipation, unspecified: Secondary | ICD-10-CM | POA: Insufficient documentation

## 2017-04-16 DIAGNOSIS — Z79899 Other long term (current) drug therapy: Secondary | ICD-10-CM | POA: Diagnosis not present

## 2017-04-16 LAB — BASIC METABOLIC PANEL
ANION GAP: 8 (ref 5–15)
BUN: 26 mg/dL — ABNORMAL HIGH (ref 6–20)
CHLORIDE: 99 mmol/L — AB (ref 101–111)
CO2: 30 mmol/L (ref 22–32)
Calcium: 9.2 mg/dL (ref 8.9–10.3)
Creatinine, Ser: 0.76 mg/dL (ref 0.44–1.00)
GLUCOSE: 110 mg/dL — AB (ref 65–99)
Potassium: 4.4 mmol/L (ref 3.5–5.1)
Sodium: 137 mmol/L (ref 135–145)

## 2017-04-16 LAB — URINALYSIS, COMPLETE (UACMP) WITH MICROSCOPIC
Bacteria, UA: NONE SEEN
Bilirubin Urine: NEGATIVE
GLUCOSE, UA: NEGATIVE mg/dL
Hgb urine dipstick: NEGATIVE
Ketones, ur: NEGATIVE mg/dL
LEUKOCYTES UA: NEGATIVE
Nitrite: NEGATIVE
PH: 5 (ref 5.0–8.0)
Protein, ur: NEGATIVE mg/dL
SPECIFIC GRAVITY, URINE: 1.026 (ref 1.005–1.030)

## 2017-04-16 LAB — CBC
HEMATOCRIT: 43 % (ref 35.0–47.0)
HEMOGLOBIN: 14.3 g/dL (ref 12.0–16.0)
MCH: 29.5 pg (ref 26.0–34.0)
MCHC: 33.3 g/dL (ref 32.0–36.0)
MCV: 88.6 fL (ref 80.0–100.0)
Platelets: 318 10*3/uL (ref 150–440)
RBC: 4.86 MIL/uL (ref 3.80–5.20)
RDW: 15.2 % — ABNORMAL HIGH (ref 11.5–14.5)
WBC: 9.9 10*3/uL (ref 3.6–11.0)

## 2017-04-16 MED ORDER — IOPAMIDOL (ISOVUE-300) INJECTION 61%
100.0000 mL | Freq: Once | INTRAVENOUS | Status: AC | PRN
  Administered 2017-04-16: 100 mL via INTRAVENOUS
  Filled 2017-04-16: qty 100

## 2017-04-16 MED ORDER — IOPAMIDOL (ISOVUE-300) INJECTION 61%
15.0000 mL | INTRAVENOUS | Status: AC
  Administered 2017-04-16: 30 mL via ORAL
  Filled 2017-04-16 (×2): qty 15

## 2017-04-16 NOTE — ED Triage Notes (Signed)
States has had R flank pain x 1 month. States has been on pain meds, muscle relaxers and steroids over this time. States she has had no Bm x 5 days.

## 2017-04-16 NOTE — ED Provider Notes (Signed)
[facility] Emergency Department Provider Note   ____________________________________________   I have reviewed the triage vital signs and the nursing notes.   HISTORY  Chief Complaint Flank Pain   History limited by: Not Limited   HPI Jackie Turner is a 81 y.o. female who presents to the emergency department today because of concerns for continued back pain however also now with concerns for decreased bowel movements abdominal distention and discomfort. The patient states that she has been having right lower back pain for a number of weeks. She has seen her primary care as well as been seen in the emergency department for this. She has had a negative x-ray. Patient states that she's been put on multiple medications. She has not had a bowel movement past 5 days. She basis is likely partly secondary to medications as well as her own personal history of IBS with constipation. She states that usually when she goes 3 days she will try taking mag citrate. She has been taking laxatives without any bowel movements. Says now been accompanied by abdominal distention and discomfort.   Past Medical History:  Diagnosis Date  . Allergy   . Asthma   . Hyperlipidemia   . Osteoporosis     Patient Active Problem List   Diagnosis Date Noted  . Recurrent URI (upper respiratory infection) 10/31/2015  . Hypoxia 09/27/2015  . Abnormal CXR 09/27/2015  . Asthma with allergic rhinitis 08/31/2015  . Back ache 08/31/2015  . Chronic interstitial cystitis 08/31/2015  . Grief reaction 08/31/2015  . Hypercholesteremia 06/26/2015  . Migraine 06/26/2015  . Asthma 06/26/2015  . IBS (irritable bowel syndrome) 06/26/2015  . Osteoporosis 06/26/2015  . Fatigue 06/26/2015  . Lumbar radiculopathy 04/27/2013  . Urinary urgency 01/08/2012  . Increased frequency of urination 01/08/2012    Past Surgical History:  Procedure Laterality Date  . ABDOMINAL HYSTERECTOMY  2003  .  BREAST BIOPSY  1976, 1978   benign  . carpal tunnel repair Bilateral    R 1990; L 1997  . EYE SURGERY Bilateral    cataract extraction  . HAND TENDON SURGERY Right    Trigger finger  . TONSILLECTOMY  1961    Prior to Admission medications   Medication Sig Start Date End Date Taking? Authorizing Provider  azithromycin (ZITHROMAX) 250 MG tablet Take 2 tablets PO on day one, and one tablet PO daily thereafter until completed. 03/14/17   Mar Daring, PA-C  Calcium Carbonate-Vitamin D 600-400 MG-UNIT per tablet Take 1 tablet by mouth 2 (two) times daily.     Historical Provider, MD  cyclobenzaprine (FLEXERIL) 5 MG tablet Take 1 tablet (5 mg total) by mouth 3 (three) times daily as needed for muscle spasms. 04/15/17   Anne-Caroline Mariea Clonts, MD  dicyclomine (BENTYL) 10 MG capsule Take 1 capsule (10 mg total) by mouth 3 (three) times daily as needed for spasms. 04/22/16   Mar Daring, PA-C  doxepin (SINEQUAN) 10 MG capsule TAKE 1 CAPSULE BY MOUTH EVERY DAY. 09/19/16   Mar Daring, PA-C  doxepin (SINEQUAN) 50 MG capsule TAKE 1 CAPSULE BY MOUTH EVERY DAY. 01/07/17   Mar Daring, PA-C  fluticasone (FLONASE) 50 MCG/ACT nasal spray Place 2 sprays into both nostrils daily.  03/03/13   Historical Provider, MD  Fluticasone-Salmeterol (ADVAIR) 250-50 MCG/DOSE AEPB Inhale 1 puff into the lungs 2 (two) times daily. 10/21/16   Mar Daring, PA-C  HYDROcodone-acetaminophen (NORCO/VICODIN) 5-325 MG tablet Take 1 tablet by mouth every 8 (eight)  hours as needed for moderate pain. 04/14/17   Mar Daring, PA-C  loratadine (CLARITIN) 10 MG tablet Take by mouth.    Historical Provider, MD  methylPREDNISolone (MEDROL) 4 MG tablet Day 1: 8 mg PO before breakfast, 4 mg after lunch and after dinner, and 8 mg at bedtime   Day 2: 4 mg PO before breakfast, after lunch, and after dinner and 8 mg at bedtime  Day 3: 4 mg PO before breakfast, after lunch, after dinner, and at bedtime  Day  4: 4 mg PO before breakfast, after lunch, and at bedtime   Day 5: 4 mg PO before breakfast and at bedtime  Day 6: 4 mg PO before breakfast 04/15/17   Eula Listen, MD  mirabegron ER (MYRBETRIQ) 25 MG TB24 tablet Take 25 mg by mouth. 01/16/17   Historical Provider, MD  montelukast (SINGULAIR) 10 MG tablet Take 1 tablet (10 mg total) by mouth at bedtime. 04/09/16   Margarita Rana, MD  nystatin (MYCOSTATIN) 100000 UNIT/ML suspension Use as directed 5 mLs (500,000 Units total) in the mouth or throat 2 (two) times daily. 04/09/17   Mar Daring, PA-C  oxyCODONE (ROXICODONE) 5 MG immediate release tablet Take 1 tablet (5 mg total) by mouth every 4 (four) hours as needed for severe pain. 04/15/17   Anne-Caroline Mariea Clonts, MD  pentosan polysulfate (ELMIRON) 100 MG capsule Take 200 mg by mouth 2 (two) times daily.  03/31/13   Historical Provider, MD  predniSONE (STERAPRED UNI-PAK 21 TAB) 10 MG (21) TBPK tablet Take as directed on package instructions 04/09/17   Mar Daring, PA-C  Tiotropium Bromide Monohydrate (SPIRIVA RESPIMAT) 1.25 MCG/ACT AERS Inhale 1.25 mcg into the lungs 2 (two) times daily. 10/21/16   Mar Daring, PA-C  ULTRAM 50 MG tablet Take 1 tab PO q 8 hours prn pain 04/09/17   Clearnce Sorrel Burnette, PA-C  VENTOLIN HFA 108 (90 Base) MCG/ACT inhaler INHALE TWO PUFFS INTO THE LUNGS EVERY 4 HOURS AS NEEDED FOR WHEEZING OR SHORTNESS OF BREATH 04/09/17   Mar Daring, PA-C  verapamil (CALAN-SR) 180 MG CR tablet Take 0.5 tablets (90 mg total) by mouth daily. 02/10/17   Mar Daring, PA-C    Allergies Nsaids; Amoxicillin; Ibandronic acid; Actonel  [risedronate]; Antihistamines, chlorpheniramine-type; Aspirin; Butalbital-aspirin-caffeine; Clarithromycin; Codeine; Gatifloxacin; Moxifloxacin; Penicillins; Risedronate sodium; Tylenol with codeine #3  [acetaminophen-codeine]; and Raloxifene  Family History  Problem Relation Age of Onset  . Heart disease Mother   . CAD Mother    . Congestive Heart Failure Mother   . Osteoarthritis Mother   . Cancer Sister     breast  . Breast cancer Sister     Social History Social History  Substance Use Topics  . Smoking status: Never Smoker  . Smokeless tobacco: Never Used  . Alcohol use No    Review of Systems Constitutional: No fever/chills Eyes: No visual changes. ENT: No sore throat. Cardiovascular: Denies chest pain. Respiratory: Denies shortness of breath. Gastrointestinal: No abdominal pain.  No nausea, no vomiting.  No diarrhea.   Genitourinary: Negative for dysuria. Musculoskeletal: Negative for back pain. Skin: Negative for rash. Neurological: Negative for headaches, focal weakness or numbness.  ____________________________________________   PHYSICAL EXAM:  VITAL SIGNS: ED Triage Vitals  Enc Vitals Group     BP 04/16/17 1524 (!) 128/97     Pulse Rate 04/16/17 1524 89     Resp 04/16/17 1524 20     Temp 04/16/17 1524 97.8 F (36.6 C)  Temp Source 04/16/17 1524 Oral     SpO2 04/16/17 1524 99 %     Weight 04/16/17 1525 124 lb (56.2 kg)     Height 04/16/17 1525 5\' 1"  (1.549 m)     Head Circumference --      Peak Flow --      Pain Score 04/16/17 1524 10     Pain Loc --      Pain Edu? --      Excl. in [place]? --      Constitutional: Alert and oriented. Well appearing and in no distress. Eyes: Conjunctivae are normal. Normal extraocular movements. ENT   Head: Normocephalic and atraumatic.   Nose: No congestion/rhinnorhea.   Mouth/Throat: Mucous membranes are moist.   Neck: No stridor. Hematological/Lymphatic/Immunilogical: No cervical lymphadenopathy. Cardiovascular: Normal rate, regular rhythm.  No murmurs, rubs, or gallops.  Respiratory: Normal respiratory effort without tachypnea nor retractions. Breath sounds are clear and equal bilaterally. No wheezes/rales/rhonchi. Gastrointestinal: Soft and somewhat distended. Somewhat tender to palpation. No tympany.  Genitourinary:  Deferred Musculoskeletal: Normal range of motion in all extremities. No lower extremity edema. Neurologic:  Normal speech and language. No gross focal neurologic deficits are appreciated.  Skin:  Skin is warm, dry and intact. No rash noted. Psychiatric: Mood and affect are normal. Speech and behavior are normal. Patient exhibits appropriate insight and judgment.  ____________________________________________    LABS (pertinent positives/negatives)  Labs Reviewed  URINALYSIS, COMPLETE (UACMP) WITH MICROSCOPIC - Abnormal; Notable for the following:       Result Value   Color, Urine YELLOW (*)    APPearance HAZY (*)    Squamous Epithelial / LPF 0-5 (*)    All other components within normal limits  BASIC METABOLIC PANEL - Abnormal; Notable for the following:    Chloride 99 (*)    Glucose, Bld 110 (*)    BUN 26 (*)    All other components within normal limits  CBC - Abnormal; Notable for the following:    RDW 15.2 (*)    All other components within normal limits     ____________________________________________   EKG  None  ____________________________________________    RADIOLOGY  CT abd/pel IMPRESSION:  1. Colonic distention with moderate increase in stool, potentially  the source of abdominal discomfort. There is no bowel wall  thickening or inflammation, however. No evidence of obstruction.  2. No acute findings within the abdomen pelvis.  3. There is opacity at the lung bases, greatest in the left lower  lobe above the elevated left hemidiaphragm. This is consistent with  chronic scarring or atelectasis and is stable when compared to CT  dated 11/13/2015.     ____________________________________________   PROCEDURES  Procedures  ____________________________________________   INITIAL IMPRESSION / ASSESSMENT AND PLAN / ED COURSE  Pertinent labs & imaging results that were available during my care of the patient were reviewed by me and considered in my  medical decision making (see chart for details).  Patient presents with primary complaint of constipation. CT scan was consistent with constipation and patient was given an enema. Patient felt significant relief after enema and had good output. Will plan on discharging to follow up with PCP.  ____________________________________________   FINAL CLINICAL IMPRESSION(S) / ED DIAGNOSES  Final diagnoses:  Constipation, unspecified constipation type     Note: This dictation was prepared with Dragon dictation. Any transcriptional errors that result from this process are unintentional     Nance Pear, MD 04/16/17 2019

## 2017-04-16 NOTE — Discharge Instructions (Signed)
Please seek medical attention for any high fevers, chest pain, shortness of breath, change in behavior, persistent vomiting, bloody stool or any other new or concerning symptoms.  

## 2017-04-16 NOTE — ED Notes (Signed)
Pt was successful with soap suds enema. Pt states she feels a lot better now.

## 2017-04-17 ENCOUNTER — Telehealth: Payer: Self-pay | Admitting: Physician Assistant

## 2017-04-17 DIAGNOSIS — M62838 Other muscle spasm: Secondary | ICD-10-CM

## 2017-04-17 DIAGNOSIS — K581 Irritable bowel syndrome with constipation: Secondary | ICD-10-CM

## 2017-04-17 DIAGNOSIS — S39012A Strain of muscle, fascia and tendon of lower back, initial encounter: Secondary | ICD-10-CM

## 2017-04-17 MED ORDER — HYDROCODONE-ACETAMINOPHEN 10-325 MG PO TABS: 1.0000 | ORAL_TABLET | Freq: Three times a day (TID) | ORAL | 0 refills | Status: DC | PRN

## 2017-04-17 MED ORDER — LINACLOTIDE 145 MCG PO CAPS: 145.0000 ug | ORAL_CAPSULE | Freq: Every day | ORAL | 0 refills | Status: DC

## 2017-04-17 MED ORDER — CYCLOBENZAPRINE HCL 5 MG PO TABS: 5.0000 mg | ORAL_TABLET | Freq: Three times a day (TID) | ORAL | 0 refills | Status: DC | PRN

## 2017-04-17 MED ORDER — PREDNISONE 10 MG PO TABS: ORAL_TABLET | ORAL | 0 refills | Status: DC

## 2017-04-17 NOTE — Telephone Encounter (Signed)
Spoke with patient as directed below. Per patient already tried Miralax and that she takes Doxepin for IBS. She reports to send the prescription to the pharmacy because she is not able to drive at this moment. She reports the pharmacy delivers her medications to her house.according to [facility] patient was seen for constipation at the ED yesterday. Patient is also requesting a prescription for Vicodin 10-325mg . She states is the same Vicodin dose that she has been taking that belonged to her husband, and that she know is not correct to be taking someone else's medication, but is the only one that helps here for 4 hours. Patient reports that she didn't get the Oxycodone fill that was prescribed by Dr.Norman at ED visit because she can't take that medication. She was also prescribed flexeril and Medrol and she also didn't get it fill because it was instructed differently from the time that Tawanna Sat saw her on 03/30. She is asking if is possible for [facility] to prescribe the Flexeril and Prednisone.  Medications request for: Vicodin 10-325 mg, Flexeril 5 mg once at bedtime, Prednisone 10 mg and the prescription for constipation.

## 2017-04-17 NOTE — Telephone Encounter (Signed)
Please review-aa 

## 2017-04-17 NOTE — Telephone Encounter (Signed)
Meds sent. Hydrocodone-APAP 10-325mg  printed.

## 2017-04-17 NOTE — Telephone Encounter (Signed)
She can add miralax initially 2 capfuls daily. Increase fluids. Call if no improvement in BM. Most likely exacerbated by the current pain medications she is having to take for her back. There are medications that we can add that are for IBS C and also some that are for narcotic induced constipation if she is taking these still for the back pain. If no improvement, or if she is already using Miralax, I can see which samples we have that may help.

## 2017-04-17 NOTE — Telephone Encounter (Signed)
Pt states she is having constipation.  Pt states she is taking 3 stool softeners a day and they are not helping.  Pt is asking for advise as to what else she can take to help with this.  [facility].  CB#[phone]/MW

## 2017-04-18 ENCOUNTER — Ambulatory Visit (INDEPENDENT_AMBULATORY_CARE_PROVIDER_SITE_OTHER): Payer: Medicare HMO | Admitting: Physician Assistant

## 2017-04-18 ENCOUNTER — Telehealth: Payer: Self-pay | Admitting: Physician Assistant

## 2017-04-18 ENCOUNTER — Encounter: Payer: Self-pay | Admitting: Physician Assistant

## 2017-04-18 VITALS — BP 144/76 | HR 96 | Temp 98.5°F | Resp 16 | Wt 123.0 lb

## 2017-04-18 DIAGNOSIS — R233 Spontaneous ecchymoses: Secondary | ICD-10-CM | POA: Diagnosis not present

## 2017-04-18 DIAGNOSIS — M549 Dorsalgia, unspecified: Secondary | ICD-10-CM

## 2017-04-18 LAB — POCT URINALYSIS DIPSTICK
Bilirubin, UA: NEGATIVE
Blood, UA: NEGATIVE
Glucose, UA: NEGATIVE
Ketones, UA: NEGATIVE
Leukocytes, UA: NEGATIVE
Nitrite, UA: NEGATIVE
Protein, UA: NEGATIVE
Spec Grav, UA: 1.015 (ref 1.010–1.025)
Urobilinogen, UA: 0.2 E.U./dL
pH, UA: 5 (ref 5.0–8.0)

## 2017-04-18 NOTE — Telephone Encounter (Signed)
Pt has little red blood like dots under her skin on her ankles.  They do not burn, itch.  Ankles are swollen some.    Pt's call back is [phone]  &  [phone]  Jackie Turner

## 2017-04-18 NOTE — Telephone Encounter (Signed)
Spoke with patient that Jackie Turner advises for her to be seen and that if she can be here at 2:45 because Jackie Turner doesn't have any more appointments available but that we do have appointments with another Pa. At first patient reports that she will go to the ED but then decided to take the appointment today at 3pm with Jackie Turner.  Thanks,  -Yona Stansbury

## 2017-04-18 NOTE — Progress Notes (Signed)
Patient: Jackie Turner Female    DOB: 01/15/1936   81 y.o.   MRN: [id number] Visit Date: 04/18/2017  Today's Provider: Trinna Post, PA-C   Chief Complaint  Patient presents with  . Back Pain  . Rash    Started yesterday on both legs   Subjective:    Back Pain  This is a new problem. The pain is present in the lumbar spine. The quality of the pain is described as aching. The pain is at a severity of 10/10. The pain is severe. Pertinent negatives include no dysuria, headaches or pelvic pain.  Rash  This is a new problem. The current episode started yesterday. The problem has been gradually worsening since onset. The affected locations include the left lower leg, right lower leg, right upper leg and left upper leg. Pertinent negatives include no anorexia, congestion or fatigue.   Jackie Turner is 81 y/o with recent health issues presenting today with a rash on her legs. She was in the emergency room on 04/15/2017 for ongoing back pain. She was treated for MSK pain with Narcotics, Flexeril, and steroids. She has not started the prednisone yet. She revisited the ED again on 04/16/2017 for abdominal pain and constipation. CT abdomen pelvis and bloodwork was normal.   She is presenting today with petechial rash on the posterior aspect of bilateral lower extremities ongoing for one day. She has not noticed any itching, burning, weeping, surrounding erythema. No fever, chills, nausea/vomiting. No tick bites that she can recall. She is not on any blood thinners.     Allergies  Allergen Reactions  . Nsaids Other (See Comments)    History of gastric ulcer  . Amoxicillin Nausea Only and Rash  . Ibandronic Acid Other (See Comments)    Muscle weakness - advised not to take  . Actonel  [Risedronate] Other (See Comments)    Gastric ulcers  . Antihistamines, Chlorpheniramine-Type   . Aspirin     Gastric ulcer  . Butalbital-Aspirin-Caffeine Other (See Comments)    Other reaction(s):  Unknown  . Clarithromycin Other (See Comments)    Bloating Bloating Bloating  . Codeine   . Gatifloxacin Other (See Comments)  . Moxifloxacin   . Penicillins Other (See Comments)  . Risedronate Sodium     Gastric ulcer  . Tylenol With Codeine #3  [Acetaminophen-Codeine]     Other reaction(s): Unknown  . Raloxifene Other (See Comments)    Bloating bloating     Current Outpatient Prescriptions:  .  Calcium Carbonate-Vitamin D 600-400 MG-UNIT per tablet, Take 1 tablet by mouth 2 (two) times daily. , Disp: , Rfl:  .  cyclobenzaprine (FLEXERIL) 5 MG tablet, Take 1 tablet (5 mg total) by mouth 3 (three) times daily as needed for muscle spasms., Disp: 30 tablet, Rfl: 0 .  dicyclomine (BENTYL) 10 MG capsule, Take 1 capsule (10 mg total) by mouth 3 (three) times daily as needed for spasms., Disp: 120 capsule, Rfl: 5 .  doxepin (SINEQUAN) 10 MG capsule, TAKE 1 CAPSULE BY MOUTH EVERY DAY., Disp: 90 capsule, Rfl: 1 .  doxepin (SINEQUAN) 50 MG capsule, TAKE 1 CAPSULE BY MOUTH EVERY DAY., Disp: 30 capsule, Rfl: 5 .  fluticasone (FLONASE) 50 MCG/ACT nasal spray, Place 2 sprays into both nostrils daily. , Disp: , Rfl:  .  Fluticasone-Salmeterol (ADVAIR) 250-50 MCG/DOSE AEPB, Inhale 1 puff into the lungs 2 (two) times daily., Disp: 60 each, Rfl: 5 .  HYDROcodone-acetaminophen (NORCO) 10-325 MG tablet,  Take 1 tablet by mouth every 8 (eight) hours as needed., Disp: 30 tablet, Rfl: 0 .  linaclotide (LINZESS) 145 MCG CAPS capsule, Take 1 capsule (145 mcg total) by mouth daily before breakfast., Disp: 30 capsule, Rfl: 0 .  loratadine (CLARITIN) 10 MG tablet, Take by mouth., Disp: , Rfl:  .  mirabegron ER (MYRBETRIQ) 25 MG TB24 tablet, Take 25 mg by mouth., Disp: , Rfl:  .  montelukast (SINGULAIR) 10 MG tablet, Take 1 tablet (10 mg total) by mouth at bedtime., Disp: 30 tablet, Rfl: 6 .  nystatin (MYCOSTATIN) 100000 UNIT/ML suspension, Use as directed 5 mLs (500,000 Units total) in the mouth or throat 2  (two) times daily., Disp: 60 mL, Rfl: 1 .  pentosan polysulfate (ELMIRON) 100 MG capsule, Take 200 mg by mouth 2 (two) times daily. , Disp: , Rfl:  .  predniSONE (DELTASONE) 10 MG tablet, Take 6 tabs PO on day 1&2, 5 tabs PO on day 3&4, 4 tabs PO on day 5&6, 3 tabs PO on day 7&8, 2 tabs PO on day 9&10, 1 tab PO on day 11&12., Disp: 42 tablet, Rfl: 0 .  Tiotropium Bromide Monohydrate (SPIRIVA RESPIMAT) 1.25 MCG/ACT AERS, Inhale 1.25 mcg into the lungs 2 (two) times daily., Disp: 4 g, Rfl: 3 .  ULTRAM 50 MG tablet, Take 1 tab PO q 8 hours prn pain, Disp: 90 tablet, Rfl: 1 .  VENTOLIN HFA 108 (90 Base) MCG/ACT inhaler, INHALE TWO PUFFS INTO THE LUNGS EVERY 4 HOURS AS NEEDED FOR WHEEZING OR SHORTNESS OF BREATH, Disp: 18 g, Rfl: 5 .  verapamil (CALAN-SR) 180 MG CR tablet, Take 0.5 tablets (90 mg total) by mouth daily., Disp: 45 tablet, Rfl: 1  Review of Systems  Constitutional: Negative.  Negative for fatigue.  HENT: Negative for congestion.   Gastrointestinal: Negative.  Negative for anorexia.  Genitourinary: Negative for decreased urine volume, difficulty urinating, dyspareunia, dysuria, frequency, hematuria, menstrual problem, pelvic pain, urgency, vaginal bleeding, vaginal discharge and vaginal pain.  Musculoskeletal: Positive for back pain. Negative for arthralgias, gait problem, joint swelling, myalgias, neck pain and neck stiffness.  Skin: Positive for rash.  Neurological: Negative for dizziness, light-headedness and headaches.    Social History  Substance Use Topics  . Smoking status: Never Smoker  . Smokeless tobacco: Never Used  . Alcohol use No   Objective:   BP (!) 144/76 (BP Location: Left Arm, Patient Position: Sitting, Cuff Size: Normal)   Pulse 96   Temp 98.5 F (36.9 C) (Oral)   Resp 16   Wt 123 lb (55.8 kg)   BMI 23.24 kg/m  Vitals:   04/18/17 1525  BP: (!) 144/76  Pulse: 96  Resp: 16  Temp: 98.5 F (36.9 C)  TempSrc: Oral  Weight: 123 lb (55.8 kg)      Physical Exam  Constitutional: She is oriented to person, place, and time. She appears well-developed and well-nourished.  Appears to be in pain, readjusting position on exam table.   Neck: Neck supple. No edema present. No Brudzinski's sign noted.  Cardiovascular: Normal rate.   Pulmonary/Chest: Effort normal.  Lymphadenopathy:    She has no cervical adenopathy.  Neurological: She is alert and oriented to person, place, and time.  Skin: Skin is warm and dry. Rash noted.  Nonblanching petechial rash on posterior aspects of bilateral lower extremities. Not in dermatomal distribution, no drainage.   Psychiatric: She has a normal mood and affect. Her behavior is normal.  Assessment & Plan:     1. Petechial rash  Have reviewed labs to include CBC and CMET from 04/16/2017 which were normal and showed no electrolyte imbalances or thrombocytopenia. UA was normal. She is currently taking Norco and flexeril, hasn't started prednisone. DDX: drug reaction, viral reaction, tick borne illness - RMSF. Unclear etiology. Low suspicion for RMSF 2/2 normal white count 2 days ago and lack of systemic signs like fever, chills, nausea, vomiting. Empirix tx with abx would likely worsen her GI issues right now. Have given strict precautions about what signs to look out for that would require further evaluation and treatment. She agrees to this plan.   Return if symptoms worsen or fail to improve.  The entirety of the information documented in the History of Present Illness, Review of Systems and Physical Exam were personally obtained by me. Portions of this information were initially documented by Ashley Royalty, CMA and reviewed by me for thoroughness and accuracy.        Trinna Post, PA-C  [facility]

## 2017-04-18 NOTE — Patient Instructions (Signed)
[place] Spotted Fever [place] spotted fever is an illness that is spread to people by infected ticks. The illness causes flulike symptoms and a reddish-purple rash. This illness can quickly become very serious. Treatment must be started right away. When the illness is not treated right away, it can sometimes lead to long-term health problems or even death. This illness is most common during warm weather when ticks are most active. What are the causes? [facility] spotted fever is caused by a type of bacteria that is called Rickettsia rickettsii. This type of bacteria is carried by [place] dog ticks and [employer]. People get infected through a bite from a tick that is infected with the bacteria. The bite is painless, and it frequently goes unnoticed. The bacteria can also infect a person when tick blood or tick feces get into a person's body through damaged skin. A tick bite is not necessary for an infection to occur. People can get [facility] spotted fever if they get a tick's blood or body fluids on their skin in the area of a small cut or sore. This could happen while removing a tick from another person or a dog. The infection is not contagious, and it cannot be spread (transmitted) from person to person. What are the signs or symptoms? Symptoms may begin 2-14 days after a tick bite. The most common early symptoms are:  Fever.  Muscle aches.  Headache.  Nausea.  Vomiting.  Poor appetite.  Abdominal pain. The reddish-purple rash usually appears 3-5 days after the first symptoms begin. The rash often starts on the wrists and ankles. It may then spread to the palms, the soles of the feet, the legs, and the trunk. How is this diagnosed? Diagnosis is based on a physical exam, medical history, and blood tests. Your health care provider may suspect [facility] spotted fever in one of these cases:  If you have recently been bitten by a tick.  If you have  been in areas that have a lot of ticks or in areas where the disease is common. How is this treated? It is important to begin treatment right away. Treatment will usually involve the use of antibiotic medicines. In some cases, your health care provider may begin treatment before the diagnosis is confirmed. If your symptoms are severe, a hospital stay may be needed. Follow these instructions at home:  Rest as much as possible until you feel better.  Take medicines only as directed by your health care provider.  Take your antibiotic medicine as directed by your health care provider. Finish the antibiotic even if you start to feel better.  Drink enough fluid to keep your urine clear or pale yellow.  Keep all follow-up visits as directed by your health care provider. This is important. How is this prevented? Avoiding tick bites can help to prevent this illness. Take these steps to avoid tick bites when you are outdoors:  Be aware that most ticks live in shrubs, low tree branches, and grassy areas. A tick can climb onto your body when you make contact with leaves or grass where the tick is waiting.  Wear protective clothing. Long sleeves and long pants are best.  Wear white clothes so you can see ticks more easily.  Tuck your pant legs into your socks.  If you go walking on a trail, stay in the middle of the trail to avoid brushing against bushes.  Avoid walking through areas that have long grass.  Put insect repellent on all exposed skin and along boot tops, pant legs, and sleeve cuffs.  Check clothing, hair, and skin repeatedly and before going inside.  Check family members and pets for ticks.  Brush off any ticks that are not attached.  Take a shower or a bath as soon as possible after you have been outdoors. Check your skin for ticks. The most common places on the body where ticks attach themselves are the scalp, neck, armpits, waist, and groin. You can also greatly reduce your  chances of getting [facility] spotted fever if you remove attached ticks as soon as possible. To remove an attached tick, use a forceps or fine-point tweezers to detach the intact tick without leaving its mouth parts in the skin. The wound from the tick bite should be washed after the tick has been removed. Contact a health care provider if:  You have drainage, swelling, or increased redness or pain in the area of the rash. Get help right away if:  You have chest pain.  You have shortness of breath.  You have a severe headache.  You have a seizure.  You have severe abdominal pain.  You are feeling confused.  You are bruising easily.  You have bleeding from your gums.  You have blood in your stool. This information is not intended to replace advice given to you by your health care provider. Make sure you discuss any questions you have with your health care provider. Document Released: 03/16/2001 Document Revised: 05/09/2016 Document Reviewed: 07/18/2014 Elsevier Interactive Patient Education  2017 [employer].

## 2017-04-18 NOTE — Telephone Encounter (Signed)
Patient advised as directed below.  Thanks,  -Joseline 

## 2017-04-19 ENCOUNTER — Telehealth: Payer: Self-pay | Admitting: Family Medicine

## 2017-04-19 NOTE — Telephone Encounter (Signed)
Pt states that she has been unable to go to the bathroom and has been to ER recently.She was prescribed prednisone by Tawanna Sat and not sure if she should take it with with the issue she is having

## 2017-04-19 NOTE — Telephone Encounter (Signed)
Looks like she was seen in ER for back pain/muscle strain and then for constipation. Per Dr. Rosanna Randy Verbal order Instructed pt to try mag citrate today and tomorrow and call if this does not help and we can get home health to come out and help her with an enema. She can not do this herself due to her back pain. Pt agreed with treatment plan and understood if she develops vomiting or severe abdominal pain to go to the ER.

## 2017-04-23 ENCOUNTER — Ambulatory Visit: Payer: Medicare HMO | Admitting: Family Medicine

## 2017-04-23 ENCOUNTER — Encounter: Payer: Medicare HMO | Admitting: Physician Assistant

## 2017-04-24 ENCOUNTER — Ambulatory Visit (INDEPENDENT_AMBULATORY_CARE_PROVIDER_SITE_OTHER): Payer: Medicare HMO

## 2017-04-24 ENCOUNTER — Ambulatory Visit
Admission: RE | Admit: 2017-04-24 | Discharge: 2017-04-24 | Disposition: A | Discharge status: Home / Self Care | Payer: Medicare HMO | Source: Ambulatory Visit | Attending: Physician Assistant | Admitting: Physician Assistant

## 2017-04-24 ENCOUNTER — Ambulatory Visit (INDEPENDENT_AMBULATORY_CARE_PROVIDER_SITE_OTHER): Payer: Medicare HMO | Admitting: Physician Assistant

## 2017-04-24 VITALS — BP 150/88 | HR 98 | Temp 99.3°F | Resp 20 | Ht 61.0 in | Wt 124.2 lb

## 2017-04-24 DIAGNOSIS — R600 Localized edema: Secondary | ICD-10-CM | POA: Diagnosis not present

## 2017-04-24 DIAGNOSIS — M62838 Other muscle spasm: Secondary | ICD-10-CM | POA: Diagnosis not present

## 2017-04-24 DIAGNOSIS — B370 Candidal stomatitis: Secondary | ICD-10-CM | POA: Diagnosis not present

## 2017-04-24 DIAGNOSIS — M4854XA Collapsed vertebra, not elsewhere classified, thoracic region, initial encounter for fracture: Secondary | ICD-10-CM

## 2017-04-24 DIAGNOSIS — Z1231 Encounter for screening mammogram for malignant neoplasm of breast: Secondary | ICD-10-CM

## 2017-04-24 DIAGNOSIS — Z1239 Encounter for other screening for malignant neoplasm of breast: Secondary | ICD-10-CM

## 2017-04-24 DIAGNOSIS — Z0000 Encounter for general adult medical examination without abnormal findings: Secondary | ICD-10-CM | POA: Diagnosis not present

## 2017-04-24 DIAGNOSIS — M545 Low back pain: Secondary | ICD-10-CM | POA: Insufficient documentation

## 2017-04-24 DIAGNOSIS — M546 Pain in thoracic spine: Secondary | ICD-10-CM | POA: Diagnosis present

## 2017-04-24 DIAGNOSIS — S39012A Strain of muscle, fascia and tendon of lower back, initial encounter: Secondary | ICD-10-CM | POA: Diagnosis not present

## 2017-04-24 DIAGNOSIS — M549 Dorsalgia, unspecified: Secondary | ICD-10-CM

## 2017-04-24 DIAGNOSIS — R233 Spontaneous ecchymoses: Secondary | ICD-10-CM

## 2017-04-24 MED ORDER — NYSTATIN 100000 UNIT/ML MT SUSP: 5.0000 mL | Freq: Two times a day (BID) | OROMUCOSAL | 1 refills | Status: DC

## 2017-04-24 MED ORDER — HYDROCODONE-ACETAMINOPHEN 10-325 MG PO TABS: 1.0000 | ORAL_TABLET | Freq: Three times a day (TID) | ORAL | 0 refills | Status: DC | PRN

## 2017-04-24 NOTE — Patient Instructions (Signed)
Jackie Turner , Thank you for taking time to come for your Medicare Wellness Visit. I appreciate your ongoing commitment to your health goals. Please review the following plan we discussed and let me know if I can assist you in the future.   Screening recommendations/referrals: Colonoscopy: n/a Mammogram: completed 10/11/2016, no longer recommended  Bone Density: completed 05/08/2017 Recommended yearly ophthalmology/optometry visit for glaucoma screening and checkup Recommended yearly dental visit for hygiene and checkup  Vaccinations: Influenza vaccine: due 10/2017 Pneumococcal vaccine: completed 2004 Tdap vaccine: completed 04/2007, due 05/08/2017- check with your insurance company for coverage  Shingles vaccine: completed 11/2007  Advanced directives: Advance directive discussed with you today. Declined information today.  Conditions/risks identified: recommend eating 3 healthy meals a day.   Next appointment: none, follow up in one year for annual wellness visit.    Preventive Care 42 Years and Older, Female Preventive care refers to lifestyle choices and visits with your health care provider that can promote health and wellness. What does preventive care include?  A yearly physical exam. This is also called an annual well check.  Dental exams once or twice a year.  Routine eye exams. Ask your health care provider how often you should have your eyes checked.  Personal lifestyle choices, including:  Daily care of your teeth and gums.  Regular physical activity.  Eating a healthy diet.  Avoiding tobacco and drug use.  Limiting alcohol use.  Practicing safe sex.  Taking low-dose aspirin every day.  Taking vitamin and mineral supplements as recommended by your health care provider. What happens during an annual well check? The services and screenings done by your health care provider during your annual well check will depend on your age, overall health, lifestyle risk  factors, and family history of disease. Counseling  Your health care provider may ask you questions about your:  Alcohol use.  Tobacco use.  Drug use.  Emotional well-being.  Home and relationship well-being.  Sexual activity.  Eating habits.  History of falls.  Memory and ability to understand (cognition).  Work and work Statistician.  Reproductive health. Screening  You may have the following tests or measurements:  Height, weight, and BMI.  Blood pressure.  Lipid and cholesterol levels. These may be checked every 5 years, or more frequently if you are over 3 years old.  Skin check.  Lung cancer screening. You may have this screening every year starting at age 36 if you have a 30-pack-year history of smoking and currently smoke or have quit within the past 15 years.  Fecal occult blood test (FOBT) of the stool. You may have this test every year starting at age 95.  Flexible sigmoidoscopy or colonoscopy. You may have a sigmoidoscopy every 5 years or a colonoscopy every 10 years starting at age 18.  Hepatitis C blood test.  Hepatitis B blood test.  Sexually transmitted disease (STD) testing.  Diabetes screening. This is done by checking your blood sugar (glucose) after you have not eaten for a while (fasting). You may have this done every 1-3 years.  Bone density scan. This is done to screen for osteoporosis. You may have this done starting at age 79.  Mammogram. This may be done every 1-2 years. Talk to your health care provider about how often you should have regular mammograms. Talk with your health care provider about your test results, treatment options, and if necessary, the need for more tests. Vaccines  Your health care provider may recommend certain vaccines, such as:  Influenza vaccine. This is recommended every year.  Tetanus, diphtheria, and acellular pertussis (Tdap, Td) vaccine. You may need a Td booster every 10 years.  Zoster vaccine. You  may need this after age 79.  Pneumococcal 13-valent conjugate (PCV13) vaccine. One dose is recommended after age 25.  Pneumococcal polysaccharide (PPSV23) vaccine. One dose is recommended after age 69. Talk to your health care provider about which screenings and vaccines you need and how often you need them. This information is not intended to replace advice given to you by your health care provider. Make sure you discuss any questions you have with your health care provider. Document Released: 12/29/2015 Document Revised: 08/21/2016 Document Reviewed: 10/03/2015 Elsevier Interactive Patient Education  2017 [place] Prevention in the Home Falls can cause injuries. They can happen to people of all ages. There are many things you can do to make your home safe and to help prevent falls. What can I do on the outside of my home?  Regularly fix the edges of walkways and driveways and fix any cracks.  Remove anything that might make you trip as you walk through a door, such as a raised step or threshold.  Trim any bushes or trees on the path to your home.  Use bright outdoor lighting.  Clear any walking paths of anything that might make someone trip, such as rocks or tools.  Regularly check to see if handrails are loose or broken. Make sure that both sides of any steps have handrails.  Any raised decks and porches should have guardrails on the edges.  Have any leaves, snow, or ice cleared regularly.  Use sand or salt on walking paths during winter.  Clean up any spills in your garage right away. This includes oil or grease spills. What can I do in the bathroom?  Use night lights.  Install grab bars by the toilet and in the tub and shower. Do not use towel bars as grab bars.  Use non-skid mats or decals in the tub or shower.  If you need to sit down in the shower, use a plastic, non-slip stool.  Keep the floor dry. Clean up any water that spills on the floor as soon as  it happens.  Remove soap buildup in the tub or shower regularly.  Attach bath mats securely with double-sided non-slip rug tape.  Do not have throw rugs and other things on the floor that can make you trip. What can I do in the bedroom?  Use night lights.  Make sure that you have a light by your bed that is easy to reach.  Do not use any sheets or blankets that are too big for your bed. They should not hang down onto the floor.  Have a firm chair that has side arms. You can use this for support while you get dressed.  Do not have throw rugs and other things on the floor that can make you trip. What can I do in the kitchen?  Clean up any spills right away.  Avoid walking on wet floors.  Keep items that you use a lot in easy-to-reach places.  If you need to reach something above you, use a strong step stool that has a grab bar.  Keep electrical cords out of the way.  Do not use floor polish or wax that makes floors slippery. If you must use wax, use non-skid floor wax.  Do not have throw rugs and other things on the floor that  can make you trip. What can I do with my stairs?  Do not leave any items on the stairs.  Make sure that there are handrails on both sides of the stairs and use them. Fix handrails that are broken or loose. Make sure that handrails are as long as the stairways.  Check any carpeting to make sure that it is firmly attached to the stairs. Fix any carpet that is loose or worn.  Avoid having throw rugs at the top or bottom of the stairs. If you do have throw rugs, attach them to the floor with carpet tape.  Make sure that you have a light switch at the top of the stairs and the bottom of the stairs. If you do not have them, ask someone to add them for you. What else can I do to help prevent falls?  Wear shoes that:  Do not have high heels.  Have rubber bottoms.  Are comfortable and fit you well.  Are closed at the toe. Do not wear sandals.  If  you use a stepladder:  Make sure that it is fully opened. Do not climb a closed stepladder.  Make sure that both sides of the stepladder are locked into place.  Ask someone to hold it for you, if possible.  Clearly mark and make sure that you can see:  Any grab bars or handrails.  First and last steps.  Where the edge of each step is.  Use tools that help you move around (mobility aids) if they are needed. These include:  Canes.  Walkers.  Scooters.  Crutches.  Turn on the lights when you go into a dark area. Replace any light bulbs as soon as they burn out.  Set up your furniture so you have a clear path. Avoid moving your furniture around.  If any of your floors are uneven, fix them.  If there are any pets around you, be aware of where they are.  Review your medicines with your doctor. Some medicines can make you feel dizzy. This can increase your chance of falling. Ask your doctor what other things that you can do to help prevent falls. This information is not intended to replace advice given to you by your health care provider. Make sure you discuss any questions you have with your health care provider. Document Released: 09/28/2009 Document Revised: 05/09/2016 Document Reviewed: 01/06/2015 Elsevier Interactive Patient Education  2017 [employer].

## 2017-04-24 NOTE — Progress Notes (Signed)
Patient: Jackie Turner, Female    DOB: Mar 21, 1936, 81 y.o.   MRN: [id number] Visit Date: 04/24/2017  Today's Provider: Mar Daring, PA-C   No chief complaint on file.  Subjective:    Annual physical exam [name] is a 81 y.o. female who presents today for health maintenance and complete physical. She feels fairly well. She reports exercising none. She reports she is sleeping fairly well.  She continues to have the thoracic back pain that radiates to the left. She states pain is getting better controlled but pain medications are causing constipation. She has been using stool softner, dulcolax and magnesium citrate prn. This has been helping the constipation. She was given a Rx for linzess which she has not started.   She has been seen in the ED but they did not obtain imaging. There is an order placed for a thoracic spine and lumbar spine xray which she has not had done just yet. She is still ahving SOB as well but states this is secondary to the pain. She does asthma at baseline. ----------------------------------------------------------------- Last CPE: 04/22/16 Pap:s/p hysterectomy 2003 Mammogram:10/08/16 [facility] imaging BI-RADS 2. Re-check annually Colonoscopy; Cologuard ordered 10/21/16 Results? BMD:05/09/15-Osteoporosis  Review of Systems  Constitutional: Negative.   HENT: Negative.   Eyes: Negative.   Respiratory: Positive for shortness of breath.   Cardiovascular: Positive for leg swelling (foot swelling; bilateral).  Gastrointestinal: Negative.   Endocrine: Negative.   Genitourinary: Negative.   Musculoskeletal: Positive for back pain.  Skin: Negative.   Allergic/Immunologic: Negative.   Neurological: Negative.   Hematological: Negative.   Psychiatric/Behavioral: Negative.     Social History      She  reports that she has never smoked. She has never used smokeless tobacco. She reports that she does not drink alcohol or use drugs.    Social History   Social History  . Marital status: Widowed    Spouse name: [name]  . Number of children: 0  . Years of education: H/S   Occupational History  . Retired    Social History Main Topics  . Smoking status: Never Smoker  . Smokeless tobacco: Never Used  . Alcohol use No  . Drug use: No  . Sexual activity: No   Other Topics Concern  . Not on file   Social History Narrative  . No narrative on file    Past Medical History:  Diagnosis Date  . Allergy   . Asthma   . Hyperlipidemia   . Osteoporosis      Patient Active Problem List   Diagnosis Date Noted  . Recurrent URI (upper respiratory infection) 10/31/2015  . Hypoxia 09/27/2015  . Abnormal CXR 09/27/2015  . Asthma with allergic rhinitis 08/31/2015  . Back ache 08/31/2015  . Chronic interstitial cystitis 08/31/2015  . Grief reaction 08/31/2015  . Hypercholesteremia 06/26/2015  . Migraine 06/26/2015  . Asthma 06/26/2015  . IBS (irritable bowel syndrome) 06/26/2015  . Osteoporosis 06/26/2015  . Fatigue 06/26/2015  . Lumbar radiculopathy 04/27/2013  . Urinary urgency 01/08/2012  . Increased frequency of urination 01/08/2012    Past Surgical History:  Procedure Laterality Date  . ABDOMINAL HYSTERECTOMY  2003  . BREAST BIOPSY  1976, 1978   benign  . carpal tunnel repair Bilateral    R 1990; L 1997  . EYE SURGERY Bilateral    cataract extraction  . HAND TENDON SURGERY Right    Trigger finger  . TONSILLECTOMY  1961  Family History        Family Status  Relation Status  . Mother Deceased at age 48  . Sister Alive  . Father Deceased       Unknown  . Sister Alive        Her family history includes Breast cancer in her sister; CAD in her mother; Cancer in her sister; Congestive Heart Failure in her mother; Heart disease in her mother; Osteoarthritis in her mother.     Allergies  Allergen Reactions  . Nsaids Other (See Comments)    History of gastric ulcer  . Amoxicillin Nausea Only  and Rash  . Ibandronic Acid Other (See Comments)    Muscle weakness - advised not to take  . Actonel  [Risedronate] Other (See Comments)    Gastric ulcers  . Antihistamines, Chlorpheniramine-Type   . Aspirin     Gastric ulcer  . Butalbital-Aspirin-Caffeine Other (See Comments)    Other reaction(s): Unknown  . Clarithromycin Other (See Comments)    Bloating Bloating Bloating  . Codeine   . Gatifloxacin Other (See Comments)  . Moxifloxacin   . Penicillins Other (See Comments)  . Risedronate Sodium     Gastric ulcer  . Tylenol With Codeine #3  [Acetaminophen-Codeine]     Other reaction(s): Unknown  . Raloxifene Other (See Comments)    Bloating bloating     Current Outpatient Prescriptions:  .  Calcium Carbonate-Vitamin D 600-400 MG-UNIT per tablet, Take 1 tablet by mouth 2 (two) times daily. , Disp: , Rfl:  .  cyclobenzaprine (FLEXERIL) 5 MG tablet, Take 1 tablet (5 mg total) by mouth 3 (three) times daily as needed for muscle spasms. (Patient not taking: Reported on 04/24/2017), Disp: 30 tablet, Rfl: 0 .  dicyclomine (BENTYL) 10 MG capsule, Take 1 capsule (10 mg total) by mouth 3 (three) times daily as needed for spasms., Disp: 120 capsule, Rfl: 5 .  doxepin (SINEQUAN) 50 MG capsule, TAKE 1 CAPSULE BY MOUTH EVERY DAY., Disp: 30 capsule, Rfl: 5 .  fluticasone (FLONASE) 50 MCG/ACT nasal spray, Place 2 sprays into both nostrils daily. , Disp: , Rfl:  .  Fluticasone-Salmeterol (ADVAIR) 250-50 MCG/DOSE AEPB, Inhale 1 puff into the lungs 2 (two) times daily., Disp: 60 each, Rfl: 5 .  HYDROcodone-acetaminophen (NORCO) 10-325 MG tablet, Take 1 tablet by mouth every 8 (eight) hours as needed., Disp: 30 tablet, Rfl: 0 .  linaclotide (LINZESS) 145 MCG CAPS capsule, Take 1 capsule (145 mcg total) by mouth daily before breakfast. (Patient not taking: Reported on 04/24/2017), Disp: 30 capsule, Rfl: 0 .  nystatin (MYCOSTATIN) 100000 UNIT/ML suspension, Use as directed 5 mLs (500,000 Units total) in  the mouth or throat 2 (two) times daily., Disp: 60 mL, Rfl: 1 .  pentosan polysulfate (ELMIRON) 100 MG capsule, Take 200 mg by mouth 2 (two) times daily. , Disp: , Rfl:  .  ULTRAM 50 MG tablet, Take 1 tab PO q 8 hours prn pain (Patient not taking: Reported on 04/24/2017), Disp: 90 tablet, Rfl: 1 .  VENTOLIN HFA 108 (90 Base) MCG/ACT inhaler, INHALE TWO PUFFS INTO THE LUNGS EVERY 4 HOURS AS NEEDED FOR WHEEZING OR SHORTNESS OF BREATH, Disp: 18 g, Rfl: 5 .  verapamil (CALAN-SR) 180 MG CR tablet, Take 0.5 tablets (90 mg total) by mouth daily., Disp: 45 tablet, Rfl: 1   Patient Care Team: Mar Daring, PA-C as PCP - General (Family Medicine)      Objective:   Vitals: BP (!) 150/88 (BP Location:  Right Arm, Patient Position: Sitting)   Pulse 98   Temp 99.3 F (37.4 C)   Resp 20   Ht 5' 1"  (1.549 m)   Wt 124 lb 3.2 oz (56.3 kg)   BMI 23.47 kg/m   Body mass index is 23.47 kg/m.  Physical Exam  Constitutional: She is oriented to person, place, and time. She appears well-developed and well-nourished. No distress.  HENT:  Head: Normocephalic and atraumatic.  Right Ear: Hearing, tympanic membrane, external ear and ear canal normal.  Left Ear: Hearing, tympanic membrane, external ear and ear canal normal.  Nose: Nose normal.  Mouth/Throat: Uvula is midline, oropharynx is clear and moist and mucous membranes are normal. No oropharyngeal exudate.  Eyes: Conjunctivae and EOM are normal. Pupils are equal, round, and reactive to light. Right eye exhibits no discharge. Left eye exhibits no discharge. No scleral icterus.  Neck: Normal range of motion. Neck supple. No JVD present. Carotid bruit is not present. No tracheal deviation present. No thyromegaly present.  Cardiovascular: Normal rate, regular rhythm, normal heart sounds and intact distal pulses.  Exam reveals no gallop and no friction rub.   No murmur heard. Pulmonary/Chest: Effort normal. No respiratory distress. She has decreased  breath sounds. She has no wheezes. She has no rales. She exhibits no tenderness. Right breast exhibits no inverted nipple, no mass, no nipple discharge, no skin change and no tenderness. Left breast exhibits no inverted nipple, no mass, no nipple discharge, no skin change and no tenderness. Breasts are symmetrical.  Abdominal: Soft. Bowel sounds are normal. She exhibits no distension and no mass. There is no tenderness. There is no rebound and no guarding.  Musculoskeletal: She exhibits no edema.       Thoracic back: She exhibits decreased range of motion, tenderness, bony tenderness, pain and spasm.       Back:  Lymphadenopathy:    She has no cervical adenopathy.  Neurological: She is alert and oriented to person, place, and time.  Skin: Skin is warm and dry. No rash noted. She is not diaphoretic.  Psychiatric: She has a normal mood and affect. Her behavior is normal. Judgment and thought content normal.  Vitals reviewed.    Depression Screen PHQ 2/9 Scores 04/24/2017 04/24/2017 04/22/2016  PHQ - 2 Score 0 0 0  PHQ- 9 Score 0 - -      Assessment & Plan:     Routine Health Maintenance and Physical Exam  Exercise Activities and Dietary recommendations Goals    . Have 3 meals a day          Recommend eating 3 healthy meals in a day.        Immunization History  Administered Date(s) Administered  . Influenza Split 09/25/2009  . Influenza, High Dose Seasonal PF 09/27/2015, 10/21/2016  . Pneumococcal Conjugate-13 02/09/2015  . Pneumococcal Polysaccharide-23 10/07/2003  . Td 05/14/2007    Health Maintenance  Topic Date Due  . TETANUS/TDAP  05/13/2017  . INFLUENZA VACCINE  07/16/2017  . DEXA SCAN  Completed  . PNA vac Low Risk Adult  Completed     Discussed health benefits of physical activity, and encouraged her to engage in regular exercise appropriate for her age and condition.   1. Strain of lumbar region, initial encounter Continue pain manangement as below. High  suspicion for compression fracture. Advised patinet to get xrays that are ordered and I will f/u with her nce xrays received. She has a f/u with her orthopedic in [place] on  Tuesday (04/29/17) already scheduled.  - HYDROcodone-acetaminophen (NORCO) 10-325 MG tablet; Take 1 tablet by mouth every 8 (eight) hours as needed.  Dispense: 30 tablet; Refill: 0 - DG Thoracic Spine W/Swimmers  2. Thrush Secondary to medications. Diagnosis pulled for medication refill. Continue current medical treatment plan. - nystatin (MYCOSTATIN) 100000 UNIT/ML suspension; Use as directed 5 mLs (500,000 Units total) in the mouth or throat 2 (two) times daily.  Dispense: 60 mL; Refill: 1  3. Petechial rash Unsure of cause of petechial rash on lower extremities. May be related to medications. Will check labs as below and f/u pending results.  - CBC w/Diff/Platelet - Sed Rate (ESR)  4. Pedal edema Will check labs as below and f/u pending results. Will give lasix and potassium as below for her to use for edema. She is to call if no improvement.  - Basic Metabolic Panel (BMET) - B Nat Peptide - furosemide (LASIX) 20 MG tablet; Take 1 tablet (20 mg total) by mouth daily as needed for edema.  Dispense: 30 tablet; Refill: 3 - potassium chloride (K-DUR) 10 MEQ tablet; Take one tab PO daily when you take fluid pill  Dispense: 30 tablet; Refill: 3  5. Muscle spasm Paraspinal muscles on the left. Suspect compression fracture vs herniated disc as source. Patient will get xrays today.  6. Breast cancer screening Mammogram due in October 2018.  7. Non-traumatic compression fracture of ninth thoracic vertebra, initial encounter ([place]) Compression fractures noted in T9 and 10 on xray. It was apparent on CXR from March 30 but was not mentioned in the report. Compression fracture has progressed since that time. She is to follow up with orthopedics on Tuesday.  8. Non-traumatic compression fracture of tenth thoracic vertebra,  initial encounter [facility]) See above medical treatment plan.   --------------------------------------------------------------------    Mar Daring, PA-C  [place]

## 2017-04-24 NOTE — Patient Instructions (Signed)
Low Back Sprain Rehab  Ask your health care provider which exercises are safe for you. Do exercises exactly as told by your health care provider and adjust them as directed. It is normal to feel mild stretching, pulling, tightness, or discomfort as you do these exercises, but you should stop right away if you feel sudden pain or your pain gets worse. Do not begin these exercises until told by your health care provider.  Stretching and range of motion exercises  These exercises warm up your muscles and joints and improve the movement and flexibility of your back. These exercises also help to relieve pain, numbness, and tingling.  Exercise A: Lumbar rotation     1. Lie on your back on a firm surface and bend your knees.  2. Straighten your arms out to your sides so each arm forms an "L" shape with a side of your body (a 90 degree angle).  3. Slowly move both of your knees to one side of your body until you feel a stretch in your lower back. Try not to let your shoulders move off of the floor.  4. Hold for __________ seconds.  5. Tense your abdominal muscles and slowly move your knees back to the starting position.  6. Repeat this exercise on the other side of your body.  Repeat __________ times. Complete this exercise __________ times a day.  Exercise B: Prone extension on elbows     1. Lie on your abdomen on a firm surface.  2. Prop yourself up on your elbows.  3. Use your arms to help lift your chest up until you feel a gentle stretch in your abdomen and your lower back.  ? This will place some of your body weight on your elbows. If this is uncomfortable, try stacking pillows under your chest.  ? Your hips should stay down, against the surface that you are lying on. Keep your hip and back muscles relaxed.  4. Hold for __________ seconds.  5. Slowly relax your upper body and return to the starting position.  Repeat __________ times. Complete this exercise __________ times a day.  Strengthening exercises  These  exercises build strength and endurance in your back. Endurance is the ability to use your muscles for a long time, even after they get tired.  Exercise C: Pelvic tilt   1. Lie on your back on a firm surface. Bend your knees and keep your feet flat.  2. Tense your abdominal muscles. Tip your pelvis up toward the ceiling and flatten your lower back into the floor.  ? To help with this exercise, you may place a small towel under your lower back and try to push your back into the towel.  3. Hold for __________ seconds.  4. Let your muscles relax completely before you repeat this exercise.  Repeat __________ times. Complete this exercise __________ times a day.  Exercise D: Alternating arm and leg raises     1. Get on your hands and knees on a firm surface. If you are on a hard floor, you may want to use padding to cushion your knees, such as an exercise mat.  2. Line up your arms and legs. Your hands should be below your shoulders, and your knees should be below your hips.  3. Lift your left leg behind you. At the same time, raise your right arm and straighten it in front of you.  ? Do not lift your leg higher than your hip.  ? Do   not lift your arm higher than your shoulder.  ? Keep your abdominal and back muscles tight.  ? Keep your hips facing the ground.  ? Do not arch your back.  ? Keep your balance carefully, and do not hold your breath.  4. Hold for __________ seconds.  5. Slowly return to the starting position and repeat with your right leg and your left arm.  Repeat __________ times. Complete this exercise __________ times a day.  Exercise E: Abdominal set with straight leg raise     1. Lie on your back on a firm surface.  2. Bend one of your knees and keep your other leg straight.  3. Tense your abdominal muscles and lift your straight leg up, 4-6 inches (10-15 cm) off the ground.  4. Keep your abdominal muscles tight and hold for __________ seconds.  ? Do not hold your breath.  ? Do not arch your back. Keep it  flat against the ground.  5. Keep your abdominal muscles tense as you slowly lower your leg back to the starting position.  6. Repeat with your other leg.  Repeat __________ times. Complete this exercise __________ times a day.  Posture and body mechanics     Body mechanics refers to the movements and positions of your body while you do your daily activities. Posture is part of body mechanics. Good posture and healthy body mechanics can help to relieve stress in your body's tissues and joints. Good posture means that your spine is in its natural S-curve position (your spine is neutral), your shoulders are pulled back slightly, and your head is not tipped forward. The following are general guidelines for applying improved posture and body mechanics to your everyday activities.  Standing     · When standing, keep your spine neutral and your feet about hip-width apart. Keep a slight bend in your knees. Your ears, shoulders, and hips should line up.  · When you do a task in which you stand in one place for a long time, place one foot up on a stable object that is 2-4 inches (5-10 cm) high, such as a footstool. This helps keep your spine neutral.  Sitting     · When sitting, keep your spine neutral and keep your feet flat on the floor. Use a footrest, if necessary, and keep your thighs parallel to the floor. Avoid rounding your shoulders, and avoid tilting your head forward.  · When working at a desk or a computer, keep your desk at a height where your hands are slightly lower than your elbows. Slide your chair under your desk so you are close enough to maintain good posture.  · When working at a computer, place your monitor at a height where you are looking straight ahead and you do not have to tilt your head forward or downward to look at the screen.  Resting     · When lying down and resting, avoid positions that are most painful for you.  · If you have pain with activities such as sitting, bending, stooping, or  squatting (flexion-based activities), lie in a position in which your body does not bend very much. For example, avoid curling up on your side with your arms and knees near your chest (fetal position).  · If you have pain with activities such as standing for a long time or reaching with your arms (extension-based activities), lie with your spine in a neutral position and bend your knees slightly. Try the following   positions:  · Lying on your side with a pillow between your knees.  · Lying on your back with a pillow under your knees.  Lifting     · When lifting objects, keep your feet at least shoulder-width apart and tighten your abdominal muscles.  · Bend your knees and hips and keep your spine neutral. It is important to lift using the strength of your legs, not your back. Do not lock your knees straight out.  · Always ask for help to lift heavy or awkward objects.  This information is not intended to replace advice given to you by your health care provider. Make sure you discuss any questions you have with your health care provider.  Document Released: 12/02/2005 Document Revised: 08/08/2016 Document Reviewed: 09/13/2015  [employer] Interactive Patient Education © 2017 [employer]

## 2017-04-24 NOTE — Progress Notes (Signed)
Subjective:   Jackie Turner is a 81 y.o. female who presents for Medicare Annual (Subsequent) preventive examination.  Review of Systems:  N/A Cardiac Risk Factors include: dyslipidemia;advanced age (>61men, >47 women)     Objective:     Vitals: BP (!) 150/88 (BP Location: Right Arm, Patient Position: Sitting)   Pulse 98   Temp 99.3 F (37.4 C)   Resp 20   Ht 5\' 1"  (1.549 m)   Wt 124 lb 3.2 oz (56.3 kg)   BMI 23.47 kg/m   Body mass index is 23.47 kg/m.   Tobacco History  Smoking Status  . Never Smoker  Smokeless Tobacco  . Never Used     Counseling given: Not Answered   Past Medical History:  Diagnosis Date  . Allergy   . Asthma   . Hyperlipidemia   . Osteoporosis    Past Surgical History:  Procedure Laterality Date  . ABDOMINAL HYSTERECTOMY  2003  . BREAST BIOPSY  1976, 1978   benign  . carpal tunnel repair Bilateral    R 1990; L 1997  . EYE SURGERY Bilateral    cataract extraction  . HAND TENDON SURGERY Right    Trigger finger  . TONSILLECTOMY  1961   Family History  Problem Relation Age of Onset  . Heart disease Mother   . CAD Mother   . Congestive Heart Failure Mother   . Osteoarthritis Mother   . Cancer Sister        breast  . Breast cancer Sister    History  Sexual Activity  . Sexual activity: No    Outpatient Encounter Prescriptions as of 04/24/2017  Medication Sig  . Calcium Carbonate-Vitamin D 600-400 MG-UNIT per tablet Take 1 tablet by mouth 2 (two) times daily.   Marland Kitchen dicyclomine (BENTYL) 10 MG capsule Take 1 capsule (10 mg total) by mouth 3 (three) times daily as needed for spasms.  Marland Kitchen doxepin (SINEQUAN) 50 MG capsule TAKE 1 CAPSULE BY MOUTH EVERY DAY.  . fluticasone (FLONASE) 50 MCG/ACT nasal spray Place 2 sprays into both nostrils daily.   . Fluticasone-Salmeterol (ADVAIR) 250-50 MCG/DOSE AEPB Inhale 1 puff into the lungs 2 (two) times daily.  Marland Kitchen HYDROcodone-acetaminophen (NORCO) 10-325 MG tablet Take 1 tablet by mouth  every 8 (eight) hours as needed.  . nystatin (MYCOSTATIN) 100000 UNIT/ML suspension Use as directed 5 mLs (500,000 Units total) in the mouth or throat 2 (two) times daily.  . pentosan polysulfate (ELMIRON) 100 MG capsule Take 200 mg by mouth 2 (two) times daily.   . VENTOLIN HFA 108 (90 Base) MCG/ACT inhaler INHALE TWO PUFFS INTO THE LUNGS EVERY 4 HOURS AS NEEDED FOR WHEEZING OR SHORTNESS OF BREATH  . verapamil (CALAN-SR) 180 MG CR tablet Take 0.5 tablets (90 mg total) by mouth daily.  . [DISCONTINUED] predniSONE (DELTASONE) 10 MG tablet Take 6 tabs PO on day 1&2, 5 tabs PO on day 3&4, 4 tabs PO on day 5&6, 3 tabs PO on day 7&8, 2 tabs PO on day 9&10, 1 tab PO on day 11&12.  . cyclobenzaprine (FLEXERIL) 5 MG tablet Take 1 tablet (5 mg total) by mouth 3 (three) times daily as needed for muscle spasms. (Patient not taking: Reported on 04/24/2017)  . linaclotide (LINZESS) 145 MCG CAPS capsule Take 1 capsule (145 mcg total) by mouth daily before breakfast. (Patient not taking: Reported on 04/24/2017)  . ULTRAM 50 MG tablet Take 1 tab PO q 8 hours prn pain (Patient not taking: Reported on  04/24/2017)  . [DISCONTINUED] doxepin (SINEQUAN) 10 MG capsule TAKE 1 CAPSULE BY MOUTH EVERY DAY. (Patient not taking: Reported on 04/24/2017)  . [DISCONTINUED] mirabegron ER (MYRBETRIQ) 25 MG TB24 tablet Take 25 mg by mouth.   No facility-administered encounter medications on file as of 04/24/2017.     Activities of Daily Living In your present state of health, do you have any difficulty performing the following activities: 04/24/2017  Hearing? N  Vision? N  Difficulty concentrating or making decisions? N  Walking or climbing stairs? N  Dressing or bathing? N  Doing errands, shopping? N  Preparing Food and eating ? N  Using the Toilet? N  In the past six months, have you accidently leaked urine? N  Do you have problems with loss of bowel control? N  Managing your Medications? N  Managing your Finances? N    Housekeeping or managing your Housekeeping? N  Some recent data might be hidden    Patient Care Team: Rubye Beach as PCP - General (Family Medicine)    Assessment:     Exercise Activities and Dietary recommendations Current Exercise Habits: The patient does not participate in regular exercise at present, Exercise limited by: orthopedic condition(s) (back injury )  Goals    . Have 3 meals a day          Recommend eating 3 healthy meals in a day.       Fall Risk Fall Risk  04/24/2017 04/22/2016  Falls in the past year? No No   Depression Screen PHQ 2/9 Scores 04/24/2017 04/24/2017 04/22/2016  PHQ - 2 Score 0 0 0  PHQ- 9 Score 0 - -     Cognitive Function     6CIT Screen 04/24/2017  What Year? 0 points  What month? 0 points  What time? 0 points  Count back from 20 0 points  Months in reverse 0 points  Repeat phrase 2 points  Total Score 2    Immunization History  Administered Date(s) Administered  . Influenza Split 09/25/2009  . Influenza, High Dose Seasonal PF 09/27/2015, 10/21/2016  . Pneumococcal Conjugate-13 02/09/2015  . Pneumococcal Polysaccharide-23 10/07/2003  . Td 05/14/2007   Screening Tests Health Maintenance  Topic Date Due  . TETANUS/TDAP  05/13/2017  . INFLUENZA VACCINE  07/16/2017  . DEXA SCAN  Completed  . PNA vac Low Risk Adult  Completed      Plan:    I have personally reviewed and addressed the Medicare Annual Wellness questionnaire and have noted the following in the patient's chart:  A. Medical and social history B. Use of alcohol, tobacco or illicit drugs  C. Current medications and supplements D. Functional ability and status E.  Nutritional status F.  Physical activity G. Advance directives H. List of other physicians I.  Hospitalizations, surgeries, and ER visits in previous 12 months J.  [place] such as hearing and vision if needed, cognitive and depression L. Referrals and appointments - none  In  addition, I have reviewed and discussed with patient certain preventive protocols, quality metrics, and best practice recommendations. A written personalized care plan for preventive services as well as general preventive health recommendations were provided to patient.  See attached scanned questionnaire for additional information.   Signed,  Tyler Aas, LPN Nurse Health Advisor   MD Recommendations: Patient has back pain and bilateral feet swelling today.

## 2017-04-25 LAB — CBC WITH DIFFERENTIAL/PLATELET
BASOS ABS: 0 10*3/uL (ref 0.0–0.2)
Basos: 0 %
EOS (ABSOLUTE): 0 10*3/uL (ref 0.0–0.4)
EOS: 0 %
HEMATOCRIT: 43 % (ref 34.0–46.6)
HEMOGLOBIN: 13.9 g/dL (ref 11.1–15.9)
IMMATURE GRANS (ABS): 0.1 10*3/uL (ref 0.0–0.1)
Immature Granulocytes: 1 %
LYMPHS: 6 %
Lymphocytes Absolute: 0.8 10*3/uL (ref 0.7–3.1)
MCH: 28.6 pg (ref 26.6–33.0)
MCHC: 32.3 g/dL (ref 31.5–35.7)
MCV: 89 fL (ref 79–97)
MONOCYTES: 8 %
Monocytes Absolute: 1.1 10*3/uL — ABNORMAL HIGH (ref 0.1–0.9)
NEUTROS ABS: 11.4 10*3/uL — AB (ref 1.4–7.0)
Neutrophils: 85 %
Platelets: 367 10*3/uL (ref 150–379)
RBC: 4.86 x10E6/uL (ref 3.77–5.28)
RDW: 15.2 % (ref 12.3–15.4)
WBC: 13.4 10*3/uL — AB (ref 3.4–10.8)

## 2017-04-25 LAB — SEDIMENTATION RATE: SED RATE: 4 mm/h (ref 0–40)

## 2017-04-25 LAB — BASIC METABOLIC PANEL
BUN/Creatinine Ratio: 36 — ABNORMAL HIGH (ref 12–28)
BUN: 26 mg/dL (ref 8–27)
CO2: 29 mmol/L (ref 18–29)
CREATININE: 0.73 mg/dL (ref 0.57–1.00)
Calcium: 9.7 mg/dL (ref 8.7–10.3)
Chloride: 99 mmol/L (ref 96–106)
GFR calc Af Amer: 91 mL/min/{1.73_m2} (ref >59)
GFR, EST NON AFRICAN AMERICAN: 79 mL/min/{1.73_m2} (ref >59)
GLUCOSE: 136 mg/dL — AB (ref 65–99)
Potassium: 5.6 mmol/L — ABNORMAL HIGH (ref 3.5–5.2)
Sodium: 143 mmol/L (ref 134–144)

## 2017-04-25 LAB — BRAIN NATRIURETIC PEPTIDE: BNP: 306 pg/mL — AB (ref 0.0–100.0)

## 2017-04-25 MED ORDER — FUROSEMIDE 20 MG PO TABS: 20.0000 mg | ORAL_TABLET | Freq: Every day | ORAL | 3 refills | Status: DC | PRN

## 2017-04-25 MED ORDER — POTASSIUM CHLORIDE ER 10 MEQ PO TBCR: EXTENDED_RELEASE_TABLET | ORAL | 3 refills | Status: DC

## 2017-04-29 ENCOUNTER — Encounter (INDEPENDENT_AMBULATORY_CARE_PROVIDER_SITE_OTHER): Payer: Self-pay | Admitting: Orthopaedic Surgery

## 2017-04-29 ENCOUNTER — Ambulatory Visit (INDEPENDENT_AMBULATORY_CARE_PROVIDER_SITE_OTHER): Payer: Medicare HMO | Admitting: Orthopaedic Surgery

## 2017-04-29 VITALS — BP 133/88 | HR 103 | Resp 14 | Ht 61.0 in | Wt 123.0 lb

## 2017-04-29 DIAGNOSIS — M545 Low back pain, unspecified: Secondary | ICD-10-CM

## 2017-04-29 DIAGNOSIS — Z87898 Personal history of other specified conditions: Secondary | ICD-10-CM

## 2017-04-29 NOTE — Progress Notes (Signed)
Office Visit Note   Patient: Jackie Turner           Date of Birth: September 21, 1936           MRN: [id number] Visit Date: 04/29/2017              Requested by: Mar Daring, PA-C [address] [place], [place] [address] PCP: Mar Daring, PA-C   Assessment & Plan: Visit Diagnoses:  1. Acute right-sided low back pain without sciatica   2. H/O right flank pain     Plan:  #1: MRI scan of the thoracic and lumbar spine to rule out acute compression fractures T9-T10. This will give some information as to whether a vertebroplasty or kyphoplasty is indicated also. This will also help [place] rule out any type of metastatic problem. #2: Follow back up after MRI  Follow-Up Instructions: Return in about 2 weeks (around 05/13/2017) for review of mri.   Orders:  No orders of the defined types were placed in this encounter.  No orders of the defined types were placed in this encounter.     Procedures: No procedures performed   Clinical Data: No additional findings.   Subjective: Chief Complaint  Patient presents with  . Middle Back - Pain    Ms. [name] is a 81 y o that presents with Middle back pain for 6 weeks. She rellates she "yanked" the cord on a blower and felt right sided middle back pain.      This [name] is a very pleasant 81 year old white female who is seen today for evaluation of a history of thoracolumbar spine pain as well as some right sided flank pain. She relates that she had ankle a cord on a blower and felt right sided middle back pain. This continued to worsen to the point where she was debilitated by this. She denied any radicular type symptoms. She was seen by her medical physicians assistant an x-ray was ordered of her lumbar spine and thoracic spine. It apparently had in March a T10 compression fracture which was mild. However with her most recent x-ray revealed a severe T10 compression fracture and a mild new T9 compression fracture.  She states however today she has no pain at all. Seen today for evaluation.        Review of Systems  Respiratory: Positive for shortness of breath.   Cardiovascular: Positive for leg swelling.  All other systems reviewed and are negative.    Objective: Vital Signs: BP 133/88   Pulse (!) 103   Resp 14   Ht 5\' 1"  (1.549 m)   Wt 123 lb (55.8 kg)   BMI 23.24 kg/m   Physical Exam  Constitutional: She is oriented to person, place, and time. She appears well-developed and well-nourished.  HENT:  Head: Normocephalic and atraumatic.  Eyes: EOM are normal. Pupils are equal, round, and reactive to light.  Pulmonary/Chest: She is in respiratory distress (ild at best today).  Neurological: She is alert and oriented to person, place, and time.  Skin: Skin is warm and dry.  Psychiatric: She has a normal mood and affect. Her behavior is normal. Judgment and thought content normal.    Back Exam   Muscle Strength  Right Quadriceps:  4/5  Left Quadriceps:  4/5  Right Hamstrings:  4/5  Left Hamstrings:  4/5   Comments:  Today she is able to touch her fingers to the floor and forward flexion backward extension does cause her little  bit of discomfort minimally. She has some degenerative reflex 2+ on the right the patella 1+ on the left patella. Absent bilateral and Achilles. Excellent strength in lower extremities bilateral symmetric. Negative straight leg raising bilaterally. No pain to percussion.      Specialty Comments:  No specialty comments available.  Imaging: No results found.   X-rays reviewed from from [facility] reveals thoracic spine shows progressed T10 compression fracture of the now severe in comparison to March would had a mild T10. She does have a new mild T9 compression fracture. Lumbar spine revealed no acute fracture or listhesis and lumbar spine.   PMFS History: Patient Active Problem List   Diagnosis Date Noted  . Recurrent URI (upper respiratory infection)  10/31/2015  . Hypoxia 09/27/2015  . Abnormal CXR 09/27/2015  . Asthma with allergic rhinitis 08/31/2015  . Back ache 08/31/2015  . Chronic interstitial cystitis 08/31/2015  . Grief reaction 08/31/2015  . Hypercholesteremia 06/26/2015  . Migraine 06/26/2015  . Asthma 06/26/2015  . IBS (irritable bowel syndrome) 06/26/2015  . Osteoporosis 06/26/2015  . Fatigue 06/26/2015  . Lumbar radiculopathy 04/27/2013  . Urinary urgency 01/08/2012  . Increased frequency of urination 01/08/2012   Past Medical History:  Diagnosis Date  . Allergy   . Asthma   . Hyperlipidemia   . Osteoporosis     Family History  Problem Relation Age of Onset  . Heart disease Mother   . CAD Mother   . Congestive Heart Failure Mother   . Osteoarthritis Mother   . Cancer Sister        breast  . Breast cancer Sister     Past Surgical History:  Procedure Laterality Date  . ABDOMINAL HYSTERECTOMY  2003  . BREAST BIOPSY  1976, 1978   benign  . carpal tunnel repair Bilateral    R 1990; L 1997  . EYE SURGERY Bilateral    cataract extraction  . HAND TENDON SURGERY Right    Trigger finger  . TONSILLECTOMY  1961   Social History   Occupational History  . Retired    Social History Main Topics  . Smoking status: Never Smoker  . Smokeless tobacco: Never Used  . Alcohol use No  . Drug use: No  . Sexual activity: No

## 2017-05-09 ENCOUNTER — Ambulatory Visit: Admission: RE | Admit: 2017-05-09 | Payer: Medicare HMO | Source: Ambulatory Visit

## 2017-05-09 ENCOUNTER — Ambulatory Visit
Admission: RE | Admit: 2017-05-09 | Discharge: 2017-05-09 | Disposition: A | Discharge status: Home / Self Care | Payer: Medicare HMO | Source: Ambulatory Visit | Attending: Orthopedic Surgery | Admitting: Orthopedic Surgery

## 2017-05-09 DIAGNOSIS — M4854XA Collapsed vertebra, not elsewhere classified, thoracic region, initial encounter for fracture: Secondary | ICD-10-CM | POA: Diagnosis not present

## 2017-05-09 DIAGNOSIS — Z87898 Personal history of other specified conditions: Secondary | ICD-10-CM | POA: Diagnosis present

## 2017-05-09 DIAGNOSIS — M545 Low back pain: Secondary | ICD-10-CM | POA: Diagnosis not present

## 2017-05-09 DIAGNOSIS — M519 Unspecified thoracic, thoracolumbar and lumbosacral intervertebral disc disorder: Secondary | ICD-10-CM | POA: Insufficient documentation

## 2017-05-09 DIAGNOSIS — M48061 Spinal stenosis, lumbar region without neurogenic claudication: Secondary | ICD-10-CM | POA: Insufficient documentation

## 2017-05-09 DIAGNOSIS — R609 Edema, unspecified: Secondary | ICD-10-CM | POA: Diagnosis not present

## 2017-05-09 DIAGNOSIS — M47816 Spondylosis without myelopathy or radiculopathy, lumbar region: Secondary | ICD-10-CM | POA: Diagnosis not present

## 2017-05-09 DIAGNOSIS — S22000A Wedge compression fracture of unspecified thoracic vertebra, initial encounter for closed fracture: Secondary | ICD-10-CM | POA: Diagnosis not present

## 2017-05-19 ENCOUNTER — Ambulatory Visit (INDEPENDENT_AMBULATORY_CARE_PROVIDER_SITE_OTHER): Payer: Medicare HMO | Admitting: Orthopaedic Surgery

## 2017-05-19 ENCOUNTER — Encounter (INDEPENDENT_AMBULATORY_CARE_PROVIDER_SITE_OTHER): Payer: Self-pay | Admitting: Orthopaedic Surgery

## 2017-05-19 VITALS — BP 139/67

## 2017-05-19 DIAGNOSIS — M545 Low back pain, unspecified: Secondary | ICD-10-CM

## 2017-05-19 NOTE — Progress Notes (Signed)
Office Visit Note   Patient: Jackie Turner           Date of Birth: Mar 30, 1936           MRN: [id number] Visit Date: 05/19/2017              Requested by: Mar Daring, PA-C [address] [place] [address] [place],  [address] PCP: Mar Daring, PA-C   Assessment & Plan: Visit Diagnoses:  1. Acute midline low back pain without sciatica    MRI scan of thoracic spine demonstrates an acute or subacute compression fracture of T10 with for vertebral plana. No cord deformity also a superior endplate compression fracture of T9 without bony retropulsion Plan: Jackie Turner is status post injury causing an exacerbation of chronic back pain. She has been evaluated at [facility] in the past with "injections." I am referring her back to [facility] for consideration of either a kyphoplasty or for vertebral plasty at teen 10 and possibly T9. She will contact the radiologist office. I've given her copies of the MRI scans of both thoracic and lumbar spine. I like to see her back in the next 4-6 weeks or sooner if she has difficulty contacting her [facility] radiologist. I will also apply a lumbar support that she can use when she works in the garden. Total time spent was nearly 40 minutes in counseling  Follow-Up Instructions: Return in about 6 weeks (around 06/30/2017).   Orders:  No orders of the defined types were placed in this encounter.  No orders of the defined types were placed in this encounter.     Procedures: No procedures performed   Clinical Data: No additional findings.   Subjective: Chief Complaint  Patient presents with  . Lower Back - Results    Jackie Turner is a 81 y o that is here for MRI results of L-spine.  Several months status post injury to lumbar and/or thoracic spine after pulling a cord on a "blower". Has had an exacerbation of chronic back pain particularly at the thoracolumbar junction. Very little discomfort either lower extremity. An MRI scan of the lumbar and  thoracic spine were obtained. She's here for the results. See MRI scan reports. Chronic degenerative lumbar spondylosis with multilevel disc disease and facet disease with progressive spinal lateral recess and foraminal stenosis at L2-3, L3-4 and L4-5. There is significant mass effect on the left L3 nerve root. Patient is not symptomatic in the left lower extremity MRI scan of the thoracic spine reveals an acute or subacute compression fracture of T10 with vertebral plana deformity. Also a superior endplate compression fracture of T9 HPI  Review of Systems   Objective: Vital Signs: BP 139/67   Physical Exam  Ortho Exam walks without a limp. Straight leg raise negative. Neurovascular exam intact. Mild percussible tenderness in the upper lumbar lower thoracic spine. No flank pain. No chest pain or shortness of breath. No abdominal complaints. Range of motion of both hips and knees  Specialty Comments:  No specialty comments available.  Imaging: No results found.   PMFS History: Patient Active Problem List   Diagnosis Date Noted  . Recurrent URI (upper respiratory infection) 10/31/2015  . Hypoxia 09/27/2015  . Abnormal CXR 09/27/2015  . Asthma with allergic rhinitis 08/31/2015  . Back ache 08/31/2015  . Chronic interstitial cystitis 08/31/2015  . Grief reaction 08/31/2015  . Hypercholesteremia 06/26/2015  . Migraine 06/26/2015  . Asthma 06/26/2015  . IBS (irritable bowel syndrome) 06/26/2015  . Osteoporosis  06/26/2015  . Fatigue 06/26/2015  . Lumbar radiculopathy 04/27/2013  . Urinary urgency 01/08/2012  . Increased frequency of urination 01/08/2012   Past Medical History:  Diagnosis Date  . Allergy   . Asthma   . Hyperlipidemia   . Osteoporosis     Family History  Problem Relation Age of Onset  . Heart disease Mother   . CAD Mother   . Congestive Heart Failure Mother   . Osteoarthritis Mother   . Cancer Sister        breast  . Breast cancer Sister     Past  Surgical History:  Procedure Laterality Date  . ABDOMINAL HYSTERECTOMY  2003  . BREAST BIOPSY  1976, 1978   benign  . carpal tunnel repair Bilateral    R 1990; L 1997  . EYE SURGERY Bilateral    cataract extraction  . HAND TENDON SURGERY Right    Trigger finger  . TONSILLECTOMY  1961   Social History   Occupational History  . Retired    Social History Main Topics  . Smoking status: Never Smoker  . Smokeless tobacco: Never Used  . Alcohol use No  . Drug use: No  . Sexual activity: No     Jackie Balding, MD   Note - This record has been created using [employer].  Chart creation errors have been sought, but may not always  have been located. Such creation errors do not reflect on  the standard of medical care.

## 2017-05-27 ENCOUNTER — Telehealth: Payer: Self-pay

## 2017-05-27 NOTE — Telephone Encounter (Signed)
Please call.

## 2017-05-27 NOTE — Telephone Encounter (Signed)
Patient would like a call to explain more about her MRI results.  CB# is [phone].  Thank You.

## 2017-05-29 NOTE — Telephone Encounter (Signed)
called

## 2017-05-30 ENCOUNTER — Telehealth (INDEPENDENT_AMBULATORY_CARE_PROVIDER_SITE_OTHER): Payer: Self-pay | Admitting: Orthopaedic Surgery

## 2017-05-30 NOTE — Telephone Encounter (Signed)
Jackie Turner called to let Dr. Durward Fortes know she is ready to move forward with the procedure for her back.  CB#[phone].  Thank you.

## 2017-05-30 NOTE — Telephone Encounter (Signed)
Please advise where you would like me to refer her.

## 2017-06-02 ENCOUNTER — Other Ambulatory Visit (INDEPENDENT_AMBULATORY_CARE_PROVIDER_SITE_OTHER): Payer: Self-pay

## 2017-06-02 DIAGNOSIS — M5440 Lumbago with sciatica, unspecified side: Secondary | ICD-10-CM

## 2017-06-02 NOTE — Telephone Encounter (Signed)
Please make an appt with Dr Estanislado Pandy at [facility] radiology for potential vertrebroplasty

## 2017-06-03 ENCOUNTER — Other Ambulatory Visit (HOSPITAL_COMMUNITY): Payer: Self-pay | Admitting: Interventional Radiology

## 2017-06-03 DIAGNOSIS — M4850XA Collapsed vertebra, not elsewhere classified, site unspecified, initial encounter for fracture: Principal | ICD-10-CM

## 2017-06-03 DIAGNOSIS — IMO0001 Reserved for inherently not codable concepts without codable children: Secondary | ICD-10-CM

## 2017-06-10 ENCOUNTER — Other Ambulatory Visit: Payer: Self-pay | Admitting: Physician Assistant

## 2017-06-10 DIAGNOSIS — S39012A Strain of muscle, fascia and tendon of lower back, initial encounter: Secondary | ICD-10-CM

## 2017-06-10 NOTE — Telephone Encounter (Signed)
Pt contacted office for refill request on the following medications: HYDROcodone-acetaminophen (NORCO) 10-325 MG tablet Last Rx: 04/24/17  Pt stated that her lower legs and feet are still swelling. Pt stated that she has done everything as directed including taking furosemide (LASIX) 20 MG tablet & potassium chloride (K-DUR) 10 MEQ tablet. Pt stated the swelling hasn't gotten worse but hasn't improved either. Pt wanted to know if she should come back for F/U or try something else. Please advise. Thanks TNP

## 2017-06-11 MED ORDER — HYDROCODONE-ACETAMINOPHEN 10-325 MG PO TABS: 1.0000 | ORAL_TABLET | Freq: Three times a day (TID) | ORAL | 0 refills | Status: DC | PRN

## 2017-06-11 NOTE — Telephone Encounter (Signed)
Pain Rx printed.   Would recommend her to increase to furosemide twice daily and increase potassium to twice daily as well to see if any improvements noted.

## 2017-06-12 NOTE — Telephone Encounter (Signed)
Pt has called back in regards to the message on the 26th.    Please advise.  Thanks [employer]

## 2017-06-12 NOTE — Telephone Encounter (Signed)
Patient advised.

## 2017-06-12 NOTE — Telephone Encounter (Signed)
Please leave a message if she does not answer.

## 2017-06-13 ENCOUNTER — Other Ambulatory Visit (HOSPITAL_COMMUNITY): Payer: Self-pay | Admitting: Interventional Radiology

## 2017-06-13 ENCOUNTER — Ambulatory Visit (HOSPITAL_COMMUNITY)
Admission: RE | Admit: 2017-06-13 | Discharge: 2017-06-13 | Disposition: A | Discharge status: Home / Self Care | Payer: Medicare HMO | Source: Ambulatory Visit | Attending: Interventional Radiology | Admitting: Interventional Radiology

## 2017-06-13 DIAGNOSIS — M4850XA Collapsed vertebra, not elsewhere classified, site unspecified, initial encounter for fracture: Principal | ICD-10-CM

## 2017-06-13 DIAGNOSIS — S22070A Wedge compression fracture of T9-T10 vertebra, initial encounter for closed fracture: Secondary | ICD-10-CM

## 2017-06-13 DIAGNOSIS — IMO0001 Reserved for inherently not codable concepts without codable children: Secondary | ICD-10-CM

## 2017-06-13 DIAGNOSIS — S22079A Unspecified fracture of T9-T10 vertebra, initial encounter for closed fracture: Secondary | ICD-10-CM | POA: Diagnosis not present

## 2017-06-13 HISTORY — PX: IR RADIOLOGIST EVAL & MGMT: IMG5224

## 2017-06-16 ENCOUNTER — Encounter (HOSPITAL_COMMUNITY): Payer: Self-pay | Admitting: Interventional Radiology

## 2017-06-16 ENCOUNTER — Other Ambulatory Visit: Payer: Self-pay | Admitting: Radiology

## 2017-06-17 ENCOUNTER — Other Ambulatory Visit (HOSPITAL_COMMUNITY): Payer: Self-pay | Admitting: Interventional Radiology

## 2017-06-17 ENCOUNTER — Ambulatory Visit (HOSPITAL_COMMUNITY)
Admission: RE | Admit: 2017-06-17 | Discharge: 2017-06-17 | Disposition: A | Discharge status: Home / Self Care | Payer: Medicare HMO | Source: Ambulatory Visit | Attending: Interventional Radiology | Admitting: Interventional Radiology

## 2017-06-17 DIAGNOSIS — S22070A Wedge compression fracture of T9-T10 vertebra, initial encounter for closed fracture: Secondary | ICD-10-CM | POA: Diagnosis not present

## 2017-06-17 DIAGNOSIS — M4854XA Collapsed vertebra, not elsewhere classified, thoracic region, initial encounter for fracture: Secondary | ICD-10-CM | POA: Insufficient documentation

## 2017-06-17 DIAGNOSIS — Z7951 Long term (current) use of inhaled steroids: Secondary | ICD-10-CM | POA: Insufficient documentation

## 2017-06-17 DIAGNOSIS — Z79899 Other long term (current) drug therapy: Secondary | ICD-10-CM | POA: Insufficient documentation

## 2017-06-17 DIAGNOSIS — J45909 Unspecified asthma, uncomplicated: Secondary | ICD-10-CM | POA: Insufficient documentation

## 2017-06-17 DIAGNOSIS — M810 Age-related osteoporosis without current pathological fracture: Secondary | ICD-10-CM | POA: Diagnosis not present

## 2017-06-17 HISTORY — PX: IR VERTEBROPLASTY CERV/THOR BX INC UNI/BIL INC/INJECT/IMAGING: IMG5515

## 2017-06-17 LAB — CBC
HCT: 40.6 % (ref 36.0–46.0)
Hemoglobin: 12.7 g/dL (ref 12.0–15.0)
MCH: 28.3 pg (ref 26.0–34.0)
MCHC: 31.3 g/dL (ref 30.0–36.0)
MCV: 90.4 fL (ref 78.0–100.0)
PLATELETS: 317 10*3/uL (ref 150–400)
RBC: 4.49 MIL/uL (ref 3.87–5.11)
RDW: 15.3 % (ref 11.5–15.5)
WBC: 7.1 10*3/uL (ref 4.0–10.5)

## 2017-06-17 LAB — PROTIME-INR
INR: 1
PROTHROMBIN TIME: 13.2 s (ref 11.4–15.2)

## 2017-06-17 LAB — BASIC METABOLIC PANEL
Anion gap: 9 (ref 5–15)
BUN: 28 mg/dL — AB (ref 6–20)
CALCIUM: 9.6 mg/dL (ref 8.9–10.3)
CHLORIDE: 101 mmol/L (ref 101–111)
CO2: 29 mmol/L (ref 22–32)
CREATININE: 0.67 mg/dL (ref 0.44–1.00)
GFR calc non Af Amer: >60 mL/min (ref >60)
GLUCOSE: 125 mg/dL — AB (ref 65–99)
Potassium: 4 mmol/L (ref 3.5–5.1)
Sodium: 139 mmol/L (ref 135–145)

## 2017-06-17 LAB — APTT: aPTT: 28 seconds (ref 24–36)

## 2017-06-17 MED ORDER — IOPAMIDOL (ISOVUE-300) INJECTION 61%
INTRAVENOUS | Status: AC
  Administered 2017-06-17: 6 mL
  Filled 2017-06-17: qty 50

## 2017-06-17 MED ORDER — VANCOMYCIN HCL IN DEXTROSE 1-5 GM/200ML-% IV SOLN
INTRAVENOUS | Status: AC
  Filled 2017-06-17: qty 200

## 2017-06-17 MED ORDER — GELATIN ABSORBABLE 12-7 MM EX MISC
CUTANEOUS | Status: AC
  Filled 2017-06-17: qty 1

## 2017-06-17 MED ORDER — FENTANYL CITRATE (PF) 100 MCG/2ML IJ SOLN
INTRAMUSCULAR | Status: AC
  Filled 2017-06-17: qty 2

## 2017-06-17 MED ORDER — BUPIVACAINE HCL (PF) 0.25 % IJ SOLN
INTRAMUSCULAR | Status: AC
  Filled 2017-06-17: qty 30

## 2017-06-17 MED ORDER — TOBRAMYCIN SULFATE 1.2 G IJ SOLR
INTRAMUSCULAR | Status: AC
  Administered 2017-06-17: 1 mg
  Filled 2017-06-17: qty 1.2

## 2017-06-17 MED ORDER — MIDAZOLAM HCL 2 MG/2ML IJ SOLN
INTRAMUSCULAR | Status: AC
  Filled 2017-06-17: qty 2

## 2017-06-17 MED ORDER — VANCOMYCIN HCL IN DEXTROSE 1-5 GM/200ML-% IV SOLN: 1000.0000 mg | INTRAVENOUS | Status: DC

## 2017-06-17 MED ORDER — SODIUM CHLORIDE 0.9 % IV SOLN: INTRAVENOUS | Status: AC

## 2017-06-17 MED ORDER — HYDROMORPHONE HCL 1 MG/ML IJ SOLN
INTRAMUSCULAR | Status: AC
  Filled 2017-06-17: qty 1

## 2017-06-17 MED ORDER — BUPIVACAINE HCL (PF) 0.25 % IJ SOLN
INTRAMUSCULAR | Status: AC | PRN
  Administered 2017-06-17: 20 mL

## 2017-06-17 MED ORDER — MIDAZOLAM HCL 2 MG/2ML IJ SOLN
INTRAMUSCULAR | Status: AC | PRN
  Administered 2017-06-17: 1 mg via INTRAVENOUS

## 2017-06-17 MED ORDER — SODIUM CHLORIDE 0.9 % IV SOLN
INTRAVENOUS | Status: DC
  Administered 2017-06-17: 09:00:00 via INTRAVENOUS

## 2017-06-17 MED ORDER — FENTANYL CITRATE (PF) 100 MCG/2ML IJ SOLN
INTRAMUSCULAR | Status: AC | PRN
  Administered 2017-06-17: 25 ug via INTRAVENOUS

## 2017-06-17 NOTE — Procedures (Signed)
S/P T 10 VP  

## 2017-06-17 NOTE — Discharge Instructions (Signed)
KYPHOPLASTY/VERTEBROPLASTY DISCHARGE INSTRUCTIONS  Medications: (check all that apply)     Resume all home medications as before procedure.                Continue your pain medications as prescribed as needed.  Over the next 3-5 days, decrease your pain medication as tolerated.  Over the counter medications (i.e. Tylenol, ibuprofen, and aleve) may be substituted once severe/moderate pain symptoms have subsided.   Wound Care: - Bandages may be removed the day following your procedure.  You may get your incision wet once bandages are removed.  Bandaids may be used to cover the incisions until scab formation.  Topical ointments are optional.  - If you develop a fever greater than 101 degrees, have increased skin redness at the incision sites or pus-like oozing from incisions occurring within 1 week of the procedure, contact radiology at [phone] or [phone].  - Ice pack to back for 15-20 minutes 2-3 time per day for first 2-3 days post procedure.  The ice will expedite muscle healing and help with the pain from the incisions.   Activity: - Bedrest today with limited activity for 24 hours post procedure.  - No driving for 48 hours.  - Increase your activity as tolerated after bedrest (with assistance if necessary).  - Refrain from any strenuous activity or heavy lifting (greater than 10 lbs.).   Follow up: - Contact radiology at [phone] or [phone] if any questions/concerns.  - A physician assistant from radiology will contact you in approximately 1 week.  - If a biopsy was performed at the time of your procedure, your referring physician should receive the results in usually 2-3 days.        1.no stooping,bending  Or lifting more than 10 lbs for 2 weeks. 2.use walker to ambulate for  2weeks. 3.No driving for 2 weeks . 4.RTC prn  2 weeks

## 2017-06-17 NOTE — H&P (Signed)
Chief Complaint: Patient was seen in consultation today for T10 compression fracture  Referring Physician(s):  Dr. Joni Fears  Supervising Physician: Luanne Bras  Patient Status: [facility] - Out-pt  History of Present Illness: Jackie Turner is a 81 y.o. female with past medical history of allergies, asthma, HLD, osteoporosis who presents with back pain.   MR Thoracic Spine 05/09/17: Findings consistent with an acute or subacute compression fracture of T10 with vertebra plana deformity anteriorly. Retropulsion of the posterior margin of T10 into the central canal narrows but does not quite as the face the ventral thecal sac. No cord deformity or signal change.  Superior endplate compression fracture of T9 without bony retropulsion. There is minimal marrow edema in the superior endplate most consistent with late subacute to remote injury.  Patient met with Dr. Estanislado Pandy in clinic 06/13/17 to discuss management options.  Vertebral body augmentation was discussed and patient elected for vertebroplasty/kyphoplasty.  She presents for procedure today in her usual state of health.  She reports cough related to nasal drainage due to severe seasonal allergies.  Patient NPO.  He does not take blood thinners.   Past Medical History:  Diagnosis Date  . Allergy   . Asthma   . Hyperlipidemia   . Osteoporosis     Past Surgical History:  Procedure Laterality Date  . ABDOMINAL HYSTERECTOMY  2003  . BREAST BIOPSY  1976, 1978   benign  . carpal tunnel repair Bilateral    R 1990; L 1997  . EYE SURGERY Bilateral    cataract extraction  . HAND TENDON SURGERY Right    Trigger finger  . IR RADIOLOGIST EVAL & MGMT  06/13/2017  . TONSILLECTOMY  1961    Allergies: Nsaids; Amoxicillin; Ibandronic acid; Actonel  [risedronate]; Antihistamines, chlorpheniramine-type; Aspirin; Buspar [buspirone]; Butalbital-aspirin-caffeine; Clarithromycin; Codeine; Gatifloxacin;  Hydrocodone-guaifenesin; Moxifloxacin; Oxycodone; Penicillins; Risedronate sodium; Tylenol with codeine #3  [acetaminophen-codeine]; and Raloxifene  Medications: Prior to Admission medications   Medication Sig Start Date End Date Taking? Authorizing Provider  Calcium Carbonate-Vitamin D 600-400 MG-UNIT per tablet Take 1 tablet by mouth 2 (two) times daily.    Yes [provider]  dicyclomine (BENTYL) 10 MG capsule Take 1 capsule (10 mg total) by mouth 3 (three) times daily as needed for spasms. 04/22/16  Yes Mar Daring, PA-C  doxepin (SINEQUAN) 50 MG capsule TAKE 1 CAPSULE BY MOUTH EVERY DAY. 01/07/17  Yes Fenton Malling M, PA-C  fluticasone (FLONASE) 50 MCG/ACT nasal spray Place 2 sprays into both nostrils daily.  03/03/13  Yes [provider]  Fluticasone-Salmeterol (ADVAIR) 250-50 MCG/DOSE AEPB Inhale 1 puff into the lungs 2 (two) times daily. 10/21/16  Yes Mar Daring, PA-C  furosemide (LASIX) 20 MG tablet Take 1 tablet (20 mg total) by mouth daily as needed for edema. 04/25/17  Yes Mar Daring, PA-C  HYDROcodone-acetaminophen (NORCO) 10-325 MG tablet Take 1 tablet by mouth every 8 (eight) hours as needed. 06/11/17  Yes Mar Daring, PA-C  oxybutynin (DITROPAN XL) 15 MG 24 hr tablet Take 15 mg by mouth at bedtime.  05/16/17  Yes [provider]  pentosan polysulfate (ELMIRON) 100 MG capsule Take 200 mg by mouth 2 (two) times daily.  03/31/13  Yes [provider]  potassium chloride (K-DUR) 10 MEQ tablet Take one tab PO daily when you take fluid pill 04/25/17  Yes Burnette, Clearnce Sorrel, PA-C  traMADol (ULTRAM) 50 MG tablet Take 50 mg by mouth 2 (two) times daily as needed  for moderate pain or severe pain.   Yes [provider]  VENTOLIN HFA 108 (90 Base) MCG/ACT inhaler INHALE TWO PUFFS INTO THE LUNGS EVERY 4 HOURS AS NEEDED FOR WHEEZING OR SHORTNESS OF BREATH 04/09/17  Yes Burnette, Clearnce Sorrel, PA-C  verapamil (CALAN-SR) 180  MG CR tablet Take 0.5 tablets (90 mg total) by mouth daily. 02/10/17  Yes Mar Daring, PA-C  nystatin (MYCOSTATIN) 100000 UNIT/ML suspension Use as directed 5 mLs (500,000 Units total) in the mouth or throat 2 (two) times daily. Patient taking differently: Use as directed 5 mLs in the mouth or throat See admin instructions. Uses every 4 months due to side effects from a steroid injection 04/24/17   Mar Daring, PA-C     Family History  Problem Relation Age of Onset  . Heart disease Mother   . CAD Mother   . Congestive Heart Failure Mother   . Osteoarthritis Mother   . Cancer Sister        breast  . Breast cancer Sister     Social History   Social History  . Marital status: Widowed    Spouse name: [name]  . Number of children: 0  . Years of education: H/S   Occupational History  . Retired    Social History Main Topics  . Smoking status: Never Smoker  . Smokeless tobacco: Never Used  . Alcohol use No  . Drug use: No  . Sexual activity: No   Other Topics Concern  . Not on file   Social History Narrative  . No narrative on file    Review of Systems  Constitutional: Negative for fatigue and fever.  Respiratory: Negative for cough and shortness of breath.   Cardiovascular: Negative for chest pain.  Musculoskeletal: Positive for back pain.  Psychiatric/Behavioral: Negative for behavioral problems and confusion.    Vital Signs: BP 127/66   Pulse 70   Temp 98.4 F (36.9 C)   Resp 18   Ht 5' (1.524 m)   Wt 115 lb (52.2 kg)   SpO2 92%   BMI 22.46 kg/m   Physical Exam  Constitutional: She is oriented to person, place, and time. She appears well-developed and well-nourished.  Cardiovascular: Normal rate, regular rhythm and normal heart sounds.   Pulmonary/Chest: Effort normal and breath sounds normal. No respiratory distress.  Musculoskeletal: She exhibits tenderness.  Neurological: She is alert and oriented to person, place, and time.  Skin: Skin  is warm and dry.  Psychiatric: She has a normal mood and affect. Her behavior is normal. Judgment and thought content normal.  Nursing note and vitals reviewed.   Mallampati Score:  MD Evaluation Airway: WNL Heart: WNL Abdomen: WNL Chest/ Lungs: WNL ASA  Classification: 3 Mallampati/Airway Score: Two  Imaging: Ir Radiologist Eval & Mgmt  Result Date: 06/16/2017 EXAM: NEW PATIENT OFFICE VISIT CHIEF COMPLAINT: Severe low thoracic pain secondary to compression fracture at T10. Current Pain Level: 1-10 HISTORY OF PRESENT ILLNESS: The patient is a 81 year old right-handed lady who has been referred for evaluation for management of severe pain in the lower thoracic spine secondary to compression fracture at T10. The patient is accompanied by her friend. B history, the patient reports sudden onset of severe lower thoracic pain following a pulling injury sometime in March of this year. The pain was almost instant burning and severe. Some circumferential radiation of pain into her thoracic cage bilaterally. Since that time the pain has been severe to the point the patient is unable  to cope with her daily activities. She lives by herself. Her pain is relieved to some degree by Vicodin. Without it, the patient reports a 10 out of 10 pain. The patient reports difficulty sleeping at night, and hasn't slept in her bed since March of this year. There is no history for recent chills, fever or rigors. There is no radiation of pain into her the lower cervical or lumbar thoracic regions. She denies any autonomic dysfunction of her bowel or bladder activities. Nevertheless, the patient continues to drive, and also is able to ambulate independently without assistance. Her appetite is somewhat reduced.  Her weight is steady. She denies any dysuria, hematuria or polyuria. She denies any chest pain, shortness of breath, palpitations, wheezing or coughing up blood. She denies any difficulty swallowing, abdominal pain,  constipation, diarrhea, or of bloody stools. Past Medical History:  Hyperlipidemia, asthma, osteoporosis. Surgical History: Abdominal hysterectomy, tonsillectomy, bilateral cataract extractions, breast biopsy benign, carpal tunnel repair, hand tendon surgery for trigger finger. Medications: Calcium carbonate vitamin-D tablets, Bentyl, Sinequan, Flonase nasal spray, Advair inhaler as needed, furosemide, hydrocodone acetaminophen tablets. Patient takes one of these at least twice a day. Linzess, nystatin as needed, Ditropan LX, pentosan, potassium chloride, Ventolin 2 puffs every 4 hours for wheezing as needed, verapamil. Allergies: Nsaids, amoxicillin causes nausea and rash, Ibandronic acid, Actonel, antihistamines, aspirin causes gastric ulcers, butalbital, clarithromycin, codeine, moxifloxacin, penicillins, risedronate, Tylenol with codeine, raloxifene. Social History: Lives by herself, has 1 sister. Denies smoking cigarettes or drinking alcohol. Denies using illicit chemicals. Family History: Sister with brain tumor and breast cancer. Mother deceased at age 45 of old age. Father unknown. REVIEW OF SYSTEMS: Negative unless as mentioned above. PHYSICAL EXAMINATION: Appears in moderate distress on account of her pain. Affect appropriate to the situation. Neurologically, however, intact without lateralizing cranial nerve, motor, sensory or coordination difficulties. Patient moderately tender to the right of midline at the T10 level. ASSESSMENT AND PLAN: The patient's recent MRI scan of the thoracolumbar spine was reviewed with her and her friend. This reveals the presence of an old healed fracture along the superior endplate at T9. A T10 vertebral body collapse is noted with loss of approximately 80% vertebral body height associated with mild to moderate retropulsion with narrowing of the anterior subarachnoid space. However, no contact with the thoracic cord or abnormal signal within the thoracic cord is identified.  Scattered hemangiomata are seen in the thoracic spine. Given the patient's clinical history and examination, the option of vertebral body augmentation at T10 was discussed with a view to her alleviating her pain and possibly of getting her off her opioid medications. The procedure would also prevent further collapse of the vertebral body. The procedure, the risks, benefits and alternatives were all reviewed in detail. Questions were answered to her satisfaction. The patient would like to proceed with vertebral body augmentation at T10. A vertebroplasty will be scheduled at the earliest possible. In the meantime, the patient has been asked to continue taking her pain medications as needed, and to start using a walker and avoid stooping, bending or lifting weights above 10 pounds. Also given the fact that she is on oxycodone to avoid driving on her own. The patient understands and is agreeable to the above management plan. She was asked to call should she have any concerns or questions. Electronically Signed   By: Luanne Bras M.D.   On: 06/13/2017 13:50    Labs:  CBC:  Recent Labs  04/16/17 1534 04/24/17 1530 06/17/17 1610  WBC 9.9 13.4* 7.1  HGB 14.3 13.9 12.7  HCT 43.0 43.0 40.6  PLT 318 367 317    COAGS:  Recent Labs  06/17/17 0824  INR 1.00  APTT 28    BMP:  Recent Labs  11/26/16 1345 04/16/17 1534 04/24/17 1530 06/17/17 0824  NA  --  137 143 139  K  --  4.4 5.6* 4.0  CL  --  99* 99 101  CO2  --  30 29 29   GLUCOSE  --  110* 136* 125*  BUN  --  26* 26 28*  CALCIUM  --  9.2 9.7 9.6  CREATININE 0.71 0.76 0.73 0.67  GFRNONAA >60 >60 79 >60  GFRAA >60 >60 91 >60    LIVER FUNCTION TESTS: No results for input(s): BILITOT, AST, ALT, ALKPHOS, PROT, ALBUMIN in the last 8760 hours.  TUMOR MARKERS: No results for input(s): AFPTM, CEA, CA199, CHROMGRNA in the last 8760 hours.  Assessment and Plan: Patient with lower back pain related to compression fracture at T10  presents for vertebroplasty/kyphoplasty.  Patient has met with Dr. Estanislado Pandy in clinic and discussed management options.  She presents for procedure today.  She has been NPO.  She does not take blood thinners.  Risks and Benefits discussed with the patient including, but not limited to education regarding the natural healing process of compression fractures without intervention, bleeding, infection, cement migration which may cause spinal cord damage, paralysis, pulmonary embolism or even death. All of the patient's questions were answered, patient is agreeable to proceed. Consent signed and in chart.   Thank you for this interesting consult.  I greatly enjoyed meeting Jackie Turner and look forward to participating in their care.  A copy of this report was sent to the requesting provider on this date.  Electronically Signed: Docia Barrier, PA 06/17/2017, 9:46 AM   I spent a total of    15 Minutes in face to face in clinical consultation, greater than 50% of which was counseling/coordinating care for compression fracture at T10.

## 2017-06-17 NOTE — Sedation Documentation (Signed)
Patient denies pain and is resting comfortably.  

## 2017-06-19 ENCOUNTER — Encounter (HOSPITAL_COMMUNITY): Payer: Self-pay | Admitting: Interventional Radiology

## 2017-06-23 ENCOUNTER — Telehealth (HOSPITAL_COMMUNITY): Payer: Self-pay

## 2017-06-23 NOTE — Telephone Encounter (Signed)
Called to schedule f/u. Pt stated that she is doing better. Does not need f/u right now. AW

## 2017-07-09 ENCOUNTER — Telehealth: Payer: Self-pay

## 2017-07-09 NOTE — Telephone Encounter (Signed)
Needs appt. Can schedule for tomorrow or Friday.

## 2017-07-09 NOTE — Telephone Encounter (Signed)
Patient states that her lower legs and feet are still swelling. She was advised to increase Lasix and Potassium to twice daily on 6/26. Patient stated that she has followed all recommendations including limiting sodium intake and elevation. Swelling hasn't gotten worse, but hasn't improved, and right foot and ankle is worse. She also mentioned that each time she takes the Lasix she gets a headache but it will go away and not linger. Please advise. CB# [phone]

## 2017-07-09 NOTE — Telephone Encounter (Signed)
Patient advised. Appointment scheduled for 7/27.

## 2017-07-11 ENCOUNTER — Ambulatory Visit (INDEPENDENT_AMBULATORY_CARE_PROVIDER_SITE_OTHER): Payer: Medicare HMO | Admitting: Physician Assistant

## 2017-07-11 ENCOUNTER — Encounter: Payer: Self-pay | Admitting: Physician Assistant

## 2017-07-11 VITALS — BP 108/78 | Temp 98.9°F | Resp 16 | Wt 118.0 lb

## 2017-07-11 DIAGNOSIS — I872 Venous insufficiency (chronic) (peripheral): Secondary | ICD-10-CM | POA: Diagnosis not present

## 2017-07-11 DIAGNOSIS — S39012A Strain of muscle, fascia and tendon of lower back, initial encounter: Secondary | ICD-10-CM

## 2017-07-11 MED ORDER — HYDROCODONE-ACETAMINOPHEN 10-325 MG PO TABS: 1.0000 | ORAL_TABLET | Freq: Three times a day (TID) | ORAL | 0 refills | Status: DC | PRN

## 2017-07-11 NOTE — Patient Instructions (Signed)

## 2017-07-11 NOTE — Progress Notes (Signed)
Patient: Jackie Turner Female    DOB: 05-10-1936   81 y.o.   MRN: [id number] Visit Date: 07/11/2017  Today's Provider: Mar Daring, PA-C   Chief Complaint  Patient presents with  . Leg Swelling   Subjective:    HPI Patient comes in today c/o swelling in both feet. She reports that this has been an ongoing issue, and it does not seem to be getting any better. She reports she has been noticing more since March when she first injured her back and became less active. She is also now starting to notice a reddish discoloration to her legs as well. She denies any resting pain, numbness or coldness.      Allergies  Allergen Reactions  . Nsaids Other (See Comments)    History of gastric ulcer  . Amoxicillin Nausea Only and Rash  . Ibandronic Acid Other (See Comments)    Muscle weakness - advised not to take  **BONIVA** - drug name  . Actonel  [Risedronate] Other (See Comments)    Gastric ulcers  . Antihistamines, Chlorpheniramine-Type   . Aspirin     Gastric ulcer  . Buspar [Buspirone] Other (See Comments)    Pt does not remember reaction   . Butalbital-Aspirin-Caffeine Other (See Comments)    Other reaction(s): Unknown  . Clarithromycin Other (See Comments)    Bloating  *BIAXIN*  . Codeine   . Gatifloxacin Other (See Comments)  . Hydrocodone-Guaifenesin Nausea Only    CODICLEAR DH STYRUP   . Moxifloxacin   . Oxycodone Other (See Comments)    Dizziness  . Penicillins Other (See Comments)  . Risedronate Sodium     Gastric ulcer  . Tylenol With Codeine #3  [Acetaminophen-Codeine]     Other reaction(s): Unknown  . Raloxifene Other (See Comments)    Bloating Bloating  *EVISTA*     Current Outpatient Prescriptions:  .  Calcium Carbonate-Vitamin D 600-400 MG-UNIT per tablet, Take 1 tablet by mouth 2 (two) times daily. , Disp: , Rfl:  .  dicyclomine (BENTYL) 10 MG capsule, Take 1 capsule (10 mg total) by mouth 3 (three) times daily as needed for  spasms., Disp: 120 capsule, Rfl: 5 .  doxepin (SINEQUAN) 50 MG capsule, TAKE 1 CAPSULE BY MOUTH EVERY DAY., Disp: 30 capsule, Rfl: 5 .  fluticasone (FLONASE) 50 MCG/ACT nasal spray, Place 2 sprays into both nostrils daily. , Disp: , Rfl:  .  Fluticasone-Salmeterol (ADVAIR) 250-50 MCG/DOSE AEPB, Inhale 1 puff into the lungs 2 (two) times daily., Disp: 60 each, Rfl: 5 .  furosemide (LASIX) 20 MG tablet, Take 1 tablet (20 mg total) by mouth daily as needed for edema., Disp: 30 tablet, Rfl: 3 .  HYDROcodone-acetaminophen (NORCO) 10-325 MG tablet, Take 1 tablet by mouth every 8 (eight) hours as needed., Disp: 30 tablet, Rfl: 0 .  oxybutynin (DITROPAN XL) 15 MG 24 hr tablet, Take 15 mg by mouth at bedtime. , Disp: , Rfl:  .  pentosan polysulfate (ELMIRON) 100 MG capsule, Take 200 mg by mouth 2 (two) times daily. , Disp: , Rfl:  .  potassium chloride (K-DUR) 10 MEQ tablet, Take one tab PO daily when you take fluid pill, Disp: 30 tablet, Rfl: 3 .  traMADol (ULTRAM) 50 MG tablet, Take 50 mg by mouth 2 (two) times daily as needed for moderate pain or severe pain., Disp: , Rfl:  .  VENTOLIN HFA 108 (90 Base) MCG/ACT inhaler, INHALE TWO PUFFS INTO THE LUNGS EVERY  4 HOURS AS NEEDED FOR WHEEZING OR SHORTNESS OF BREATH, Disp: 18 g, Rfl: 5 .  verapamil (CALAN-SR) 180 MG CR tablet, Take 0.5 tablets (90 mg total) by mouth daily., Disp: 45 tablet, Rfl: 1 .  nystatin (MYCOSTATIN) 100000 UNIT/ML suspension, Use as directed 5 mLs (500,000 Units total) in the mouth or throat 2 (two) times daily. (Patient not taking: Reported on 07/11/2017), Disp: 60 mL, Rfl: 1  Review of Systems  Constitutional: Negative.   Respiratory: Negative.   Cardiovascular: Positive for leg swelling. Negative for chest pain and palpitations.  Musculoskeletal: Positive for back pain (chronic from compression fracture s/p vertebroplasty approx 2-4 weeks ago).  Skin: Positive for color change.  Neurological: Negative.   Psychiatric/Behavioral:  Negative.     Social History  Substance Use Topics  . Smoking status: Never Smoker  . Smokeless tobacco: Never Used  . Alcohol use No   Objective:   BP 108/78 (BP Location: Left Arm, Patient Position: Sitting, Cuff Size: Normal)   Temp 98.9 F (37.2 C)   Resp 16   Wt 118 lb (53.5 kg)   BMI 23.05 kg/m  Vitals:   07/11/17 1353  BP: 108/78  Resp: 16  Temp: 98.9 F (37.2 C)  Weight: 118 lb (53.5 kg)     Physical Exam  Constitutional: She appears well-developed and well-nourished. No distress.  Neck: Normal range of motion. Neck supple. No JVD present. No tracheal deviation present. No thyromegaly present.  Cardiovascular: Normal rate, regular rhythm and normal heart sounds.  Exam reveals no gallop and no friction rub.   No murmur heard. Pulmonary/Chest: Effort normal and breath sounds normal. No respiratory distress. She has no wheezes. She has no rales.  Musculoskeletal: She exhibits edema. Tenderness: 1+ pitting edema bilaterally with reticular veins noted.  Lymphadenopathy:    She has no cervical adenopathy.  Skin: She is not diaphoretic.     Vitals reviewed.       Assessment & Plan:     1. Venous insufficiency Suspect the swelling and discoloration to be from venous insufficiency and from lack of mobility. Patient reports she is trying to start moving around more again. Discussed using compression stockings and elevating legs when she is at rest. She voices understanding. Continue furosemide prn. She is to call if symptoms worsen or fail to improve.   2. Strain of lumbar region, initial encounter Stable. Diagnosis pulled for medication refill. Continue current medical treatment plan. - HYDROcodone-acetaminophen (NORCO) 10-325 MG tablet; Take 1 tablet by mouth every 8 (eight) hours as needed.  Dispense: 30 tablet; Refill: 0       Mar Daring, PA-C  [facility]

## 2017-07-16 DIAGNOSIS — M48061 Spinal stenosis, lumbar region without neurogenic claudication: Secondary | ICD-10-CM | POA: Diagnosis not present

## 2017-07-16 DIAGNOSIS — M5432 Sciatica, left side: Secondary | ICD-10-CM | POA: Diagnosis not present

## 2017-08-21 ENCOUNTER — Other Ambulatory Visit: Payer: Self-pay | Admitting: Physician Assistant

## 2017-08-26 DIAGNOSIS — C44529 Squamous cell carcinoma of skin of other part of trunk: Secondary | ICD-10-CM | POA: Diagnosis not present

## 2017-08-26 DIAGNOSIS — D485 Neoplasm of uncertain behavior of skin: Secondary | ICD-10-CM | POA: Diagnosis not present

## 2017-08-26 DIAGNOSIS — D692 Other nonthrombocytopenic purpura: Secondary | ICD-10-CM | POA: Diagnosis not present

## 2017-08-26 DIAGNOSIS — L720 Epidermal cyst: Secondary | ICD-10-CM | POA: Diagnosis not present

## 2017-09-04 DIAGNOSIS — R300 Dysuria: Secondary | ICD-10-CM | POA: Diagnosis not present

## 2017-09-04 DIAGNOSIS — K582 Mixed irritable bowel syndrome: Secondary | ICD-10-CM | POA: Diagnosis not present

## 2017-09-04 DIAGNOSIS — N3010 Interstitial cystitis (chronic) without hematuria: Secondary | ICD-10-CM | POA: Diagnosis not present

## 2017-09-09 DIAGNOSIS — L905 Scar conditions and fibrosis of skin: Secondary | ICD-10-CM | POA: Diagnosis not present

## 2017-09-09 DIAGNOSIS — C44529 Squamous cell carcinoma of skin of other part of trunk: Secondary | ICD-10-CM | POA: Diagnosis not present

## 2017-09-17 ENCOUNTER — Other Ambulatory Visit: Payer: Self-pay | Admitting: Physician Assistant

## 2017-09-17 DIAGNOSIS — I10 Essential (primary) hypertension: Secondary | ICD-10-CM

## 2017-09-17 DIAGNOSIS — F5101 Primary insomnia: Secondary | ICD-10-CM

## 2017-09-22 ENCOUNTER — Ambulatory Visit (INDEPENDENT_AMBULATORY_CARE_PROVIDER_SITE_OTHER): Payer: Medicare HMO | Admitting: Physician Assistant

## 2017-09-22 ENCOUNTER — Encounter: Payer: Self-pay | Admitting: Physician Assistant

## 2017-09-22 VITALS — BP 110/76 | HR 72 | Temp 98.1°F | Resp 20 | Wt 113.8 lb

## 2017-09-22 DIAGNOSIS — R110 Nausea: Secondary | ICD-10-CM

## 2017-09-22 DIAGNOSIS — Z23 Encounter for immunization: Secondary | ICD-10-CM | POA: Diagnosis not present

## 2017-09-22 DIAGNOSIS — IMO0002 Reserved for concepts with insufficient information to code with codable children: Secondary | ICD-10-CM

## 2017-09-22 DIAGNOSIS — C4492 Squamous cell carcinoma of skin, unspecified: Secondary | ICD-10-CM

## 2017-09-22 DIAGNOSIS — R51 Headache: Secondary | ICD-10-CM

## 2017-09-22 DIAGNOSIS — R519 Headache, unspecified: Secondary | ICD-10-CM

## 2017-09-22 NOTE — Progress Notes (Signed)
Patient: Jackie Turner Female    DOB: [birth year]   81 y.o.   MRN: [id number] Visit Date: 09/22/2017  Today's Provider: Mar Daring, PA-C   Chief Complaint  Patient presents with  . URI   Subjective:    HPI Patient here today C/O headache and nausea x's 1 week. Patient reports symptoms are same as first day. Patient reports headache is on left front side of forehead. Patient reports taking tramadol 50 mg 1 tablet in the morning and 1 tablet in the afternoon, reports moderate improvement. Patient reports nausea is present with headaches and denies vomiting, pt reports taking Ultram once or twice a day reports good symptom control. Nausea has been controlled with emetrol. Did have fever one night when she woke up with sweats and temperature was 100.6 degrees. Feels it may be due to mold allergy but patient unsure.   Patient reports she had been swelling in July and august around feet and ankle. Patient reports she has stopped taking furosemide and has not had anymore swelling. Patient related swelling with Boost due to high content of sodium and that since she has stopped drinking Boost symptoms have improved and she no longer requires furosemide.     Allergies  Allergen Reactions  . Nsaids Other (See Comments)    History of gastric ulcer  . Amoxicillin Nausea Only and Rash  . Ibandronic Acid Other (See Comments)    Muscle weakness - advised not to take  **BONIVA** - drug name  . Actonel  [Risedronate] Other (See Comments)    Gastric ulcers  . Antihistamines, Chlorpheniramine-Type   . Aspirin     Gastric ulcer  . Buspar [Buspirone] Other (See Comments)    Pt does not remember reaction   . Butalbital-Aspirin-Caffeine Other (See Comments)    Other reaction(s): Unknown  . Clarithromycin Other (See Comments)    Bloating  *BIAXIN*  . Codeine   . Gatifloxacin Other (See Comments)  . Hydrocodone-Guaifenesin Nausea Only    CODICLEAR DH STYRUP   . Moxifloxacin    . Oxycodone Other (See Comments)    Dizziness  . Penicillins Other (See Comments)  . Risedronate Sodium     Gastric ulcer  . Tylenol With Codeine #3  [Acetaminophen-Codeine]     Other reaction(s): Unknown  . Raloxifene Other (See Comments)    Bloating Bloating  *EVISTA*     Current Outpatient Prescriptions:  .  Calcium Carbonate-Vitamin D 600-400 MG-UNIT per tablet, Take 1 tablet by mouth 2 (two) times daily. , Disp: , Rfl:  .  dicyclomine (BENTYL) 10 MG capsule, Take 1 capsule (10 mg total) by mouth 3 (three) times daily as needed for spasms., Disp: 120 capsule, Rfl: 5 .  doxepin (SINEQUAN) 10 MG capsule, TAKE 1 CAPSULE BY MOUTH EVERY DAY., Disp: 90 capsule, Rfl: 1 .  doxepin (SINEQUAN) 50 MG capsule, TAKE 1 CAPSULE BY MOUTH EVERY DAY., Disp: 90 capsule, Rfl: 1 .  fluticasone (FLONASE) 50 MCG/ACT nasal spray, Place 2 sprays into both nostrils daily. , Disp: , Rfl:  .  Fluticasone-Salmeterol (ADVAIR) 250-50 MCG/DOSE AEPB, Inhale 1 puff into the lungs 2 (two) times daily., Disp: 60 each, Rfl: 5 .  oxybutynin (DITROPAN XL) 15 MG 24 hr tablet, Take 15 mg by mouth at bedtime. , Disp: , Rfl:  .  pentosan polysulfate (ELMIRON) 100 MG capsule, Take 200 mg by mouth 2 (two) times daily. , Disp: , Rfl:  .  traMADol (ULTRAM) 50 MG tablet,  Take 50 mg by mouth 2 (two) times daily as needed for moderate pain or severe pain., Disp: , Rfl:  .  VENTOLIN HFA 108 (90 Base) MCG/ACT inhaler, INHALE TWO PUFFS INTO THE LUNGS EVERY 4 HOURS AS NEEDED FOR WHEEZING OR SHORTNESS OF BREATH, Disp: 18 g, Rfl: 5 .  verapamil (CALAN-SR) 180 MG CR tablet, TAKE ONE-HALF TABLET BY MOUTH DAILY, Disp: 45 tablet, Rfl: 1 .  furosemide (LASIX) 20 MG tablet, Take 1 tablet (20 mg total) by mouth daily as needed for edema. (Patient not taking: Reported on 09/22/2017), Disp: 30 tablet, Rfl: 3 .  potassium chloride (K-DUR) 10 MEQ tablet, Take one tab PO daily when you take fluid pill (Patient not taking: Reported on 09/22/2017),  Disp: 30 tablet, Rfl: 3  Review of Systems  Constitutional: Positive for appetite change (decreased), diaphoresis and fever (100.6 Saturday morning). Negative for activity change, chills and fatigue.  HENT: Negative for congestion, ear pain, postnasal drip, rhinorrhea, sinus pain, sinus pressure, sneezing and sore throat.   Respiratory: Positive for shortness of breath (baseline). Negative for cough, chest tightness and wheezing.   Cardiovascular: Negative.   Gastrointestinal: Positive for nausea. Negative for abdominal pain, constipation, diarrhea and vomiting.  Neurological: Positive for headaches. Negative for dizziness, syncope, weakness, light-headedness and numbness.    Social History  Substance Use Topics  . Smoking status: Never Smoker  . Smokeless tobacco: Never Used  . Alcohol use No   Objective:   BP 110/76 (BP Location: Left Arm, Patient Position: Sitting, Cuff Size: Normal)   Pulse 72   Temp 98.1 F (36.7 C) (Oral)   Resp 20   Wt 113 lb 12.8 oz (51.6 kg)   SpO2 95%   BMI 22.23 kg/m  Vitals:   09/22/17 1507  BP: 110/76  Pulse: 72  Resp: 20  Temp: 98.1 F (36.7 C)  TempSrc: Oral  SpO2: 95%  Weight: 113 lb 12.8 oz (51.6 kg)     Physical Exam  Constitutional: She is oriented to person, place, and time. She appears well-developed and well-nourished. No distress.  HENT:  Head: Normocephalic and atraumatic.  Right Ear: Hearing, tympanic membrane, external ear and ear canal normal.  Left Ear: Hearing, tympanic membrane, external ear and ear canal normal.  Nose: Nose normal.  Mouth/Throat: Uvula is midline, oropharynx is clear and moist and mucous membranes are normal. No oropharyngeal exudate.  Eyes: Pupils are equal, round, and reactive to light. Conjunctivae are normal. Right eye exhibits no discharge. Left eye exhibits no discharge. No scleral icterus.  Neck: Normal range of motion. Neck supple. No tracheal deviation present. No thyromegaly present.    Cardiovascular: Normal rate, regular rhythm and normal heart sounds.  Exam reveals no gallop and no friction rub.   No murmur heard. Pulmonary/Chest: Effort normal. No stridor. No respiratory distress. She has wheezes (expiratory wheezes throughout). She has no rales.  Lymphadenopathy:    She has no cervical adenopathy.  Neurological: She is alert and oriented to person, place, and time. She has normal strength. No cranial nerve deficit. She displays a negative Romberg sign. Coordination and gait normal.  Skin: Skin is warm and dry. She is not diaphoretic.  Vitals reviewed.      Assessment & Plan:     1. Intractable headache, unspecified chronicity pattern, unspecified headache type Unsure of possible cause of headache located around left eye and cheek. No sinus infection symptoms. No visual changes or gross neuro deficit. Will have her restart Singulair and Claritin x  1 week to see if symptoms improve. She is to call the office by Friday 10/12//18 if no improvement and will order head CT. She is to call if symptoms worsen or change.   2. Nausea See above medical treatment plan.  3. Squamous cell carcinoma Recently had squamous cell carcinoma removed from mid upper back by Dr. Evorn Gong.   4. Need for influenza vaccination Flu vaccine given today without complication. Patient sat upright for 15 minutes to check for adverse reaction before being released. - Flu vaccine HIGH DOSE PF (Fluzone High dose)       Mar Daring, PA-C  [facility]

## 2017-09-22 NOTE — Patient Instructions (Signed)
Restart singulair (montelukast) Start loratadine (Claritin)  Call me if no improvement by Friday

## 2017-09-26 ENCOUNTER — Other Ambulatory Visit: Payer: Self-pay | Admitting: Physician Assistant

## 2017-09-26 NOTE — Telephone Encounter (Signed)
rx called in-Alta Shober V Zorah Backes, RMA  

## 2017-11-03 ENCOUNTER — Other Ambulatory Visit: Payer: Self-pay | Admitting: Physician Assistant

## 2017-11-03 DIAGNOSIS — J4540 Moderate persistent asthma, uncomplicated: Secondary | ICD-10-CM

## 2017-11-03 NOTE — Telephone Encounter (Signed)
Pharmacy requesting refills. Thanks!  

## 2017-11-18 DIAGNOSIS — M48061 Spinal stenosis, lumbar region without neurogenic claudication: Secondary | ICD-10-CM | POA: Diagnosis not present

## 2017-11-18 DIAGNOSIS — M25562 Pain in left knee: Secondary | ICD-10-CM | POA: Diagnosis not present

## 2017-11-18 DIAGNOSIS — M79652 Pain in left thigh: Secondary | ICD-10-CM | POA: Diagnosis not present

## 2017-11-18 DIAGNOSIS — M545 Low back pain: Secondary | ICD-10-CM | POA: Diagnosis not present

## 2017-11-28 ENCOUNTER — Telehealth: Payer: Self-pay | Admitting: Physician Assistant

## 2017-11-28 DIAGNOSIS — B370 Candidal stomatitis: Secondary | ICD-10-CM

## 2017-11-28 MED ORDER — NYSTATIN 100000 UNIT/ML MT SUSP: 5.0000 mL | Freq: Four times a day (QID) | OROMUCOSAL | 0 refills | Status: DC

## 2017-11-28 NOTE — Telephone Encounter (Signed)
Refill sent.

## 2017-11-28 NOTE — Telephone Encounter (Signed)
Please Review

## 2017-11-28 NOTE — Telephone Encounter (Signed)
Pt contacted office for refill request on the following medications:  Nystatin 100000 U/ML SUS ML.    [facility]  CB#[phone]/MW

## 2017-12-10 ENCOUNTER — Other Ambulatory Visit: Payer: Self-pay | Admitting: Physician Assistant

## 2017-12-10 NOTE — Telephone Encounter (Signed)
RX called in at [facility]

## 2017-12-17 ENCOUNTER — Other Ambulatory Visit: Payer: Self-pay | Admitting: Physician Assistant

## 2017-12-17 DIAGNOSIS — K589 Irritable bowel syndrome without diarrhea: Secondary | ICD-10-CM

## 2017-12-31 ENCOUNTER — Encounter: Payer: Self-pay | Admitting: Physician Assistant

## 2017-12-31 ENCOUNTER — Ambulatory Visit: Payer: Medicare HMO | Admitting: Physician Assistant

## 2017-12-31 VITALS — BP 120/76 | HR 85 | Temp 98.4°F | Wt 119.8 lb

## 2017-12-31 DIAGNOSIS — R8290 Unspecified abnormal findings in urine: Secondary | ICD-10-CM

## 2017-12-31 DIAGNOSIS — J4540 Moderate persistent asthma, uncomplicated: Secondary | ICD-10-CM | POA: Diagnosis not present

## 2017-12-31 DIAGNOSIS — M8000XD Age-related osteoporosis with current pathological fracture, unspecified site, subsequent encounter for fracture with routine healing: Secondary | ICD-10-CM | POA: Diagnosis not present

## 2017-12-31 LAB — POCT URINALYSIS DIPSTICK
BILIRUBIN UA: NEGATIVE
Blood, UA: NEGATIVE
GLUCOSE UA: NEGATIVE
KETONES UA: NEGATIVE
Leukocytes, UA: NEGATIVE
Nitrite, UA: NEGATIVE
Protein, UA: NEGATIVE
SPEC GRAV UA: 1.02 (ref 1.010–1.025)
Urobilinogen, UA: 0.2 E.U./dL
pH, UA: 6 (ref 5.0–8.0)

## 2017-12-31 NOTE — Patient Instructions (Addendum)
Magnesium sulfate 250mg  tablet for cramping  Increase Advair to twice daily  Call [facility] imaging for mammogram  Referral has been placed to see endocrinology for osteoporosis (prolia injection)

## 2017-12-31 NOTE — Progress Notes (Signed)
Patient: Jackie Turner Female    DOB: 1936-02-18   82 y.o.   MRN: [id number] Visit Date: 12/31/2017  Today's Provider: Mar Daring, PA-C   Chief Complaint  Patient presents with  . urine odor   Subjective:    HPI Patient presents today with complaints of urine odor the past couple weeks. She denies dysuria, urinary frequency and urgency. She does have a PMH of interstial cystitis.    Allergies  Allergen Reactions  . Nsaids Other (See Comments)    History of gastric ulcer  . Amoxicillin Nausea Only and Rash  . Ibandronic Acid Other (See Comments)    Muscle weakness - advised not to take  **BONIVA** - drug name  . Actonel  [Risedronate] Other (See Comments)    Gastric ulcers  . Antihistamines, Chlorpheniramine-Type   . Aspirin     Gastric ulcer  . Buspar [Buspirone] Other (See Comments)    Pt does not remember reaction   . Butalbital-Aspirin-Caffeine Other (See Comments)    Other reaction(s): Unknown  . Clarithromycin Other (See Comments)    Bloating  *BIAXIN*  . Codeine   . Gatifloxacin Other (See Comments)  . Hydrocodone-Guaifenesin Nausea Only    CODICLEAR DH STYRUP   . Moxifloxacin   . Oxycodone Other (See Comments)    Dizziness  . Penicillins Other (See Comments)  . Risedronate Sodium     Gastric ulcer  . Tylenol With Codeine #3  [Acetaminophen-Codeine]     Other reaction(s): Unknown  . Raloxifene Other (See Comments)    Bloating Bloating  *EVISTA*     Current Outpatient Medications:  .  ADVAIR DISKUS 250-50 MCG/DOSE AEPB, INHALE 1 PUFF INTO THE LUNGS 2 TIMES DAILY, Disp: 60 each, Rfl: 5 .  Calcium Carbonate-Vitamin D 600-400 MG-UNIT per tablet, Take 1 tablet by mouth 2 (two) times daily. , Disp: , Rfl:  .  dicyclomine (BENTYL) 10 MG capsule, TAKE 1 CAPSULE BY MOUTH 3 TIMES A DAY ASNEEDED FOR SPASMS, Disp: 120 capsule, Rfl: 5 .  doxepin (SINEQUAN) 10 MG capsule, TAKE 1 CAPSULE BY MOUTH EVERY DAY., Disp: 90 capsule, Rfl: 1 .   fluticasone (FLONASE) 50 MCG/ACT nasal spray, Place 2 sprays into both nostrils daily. , Disp: , Rfl:  .  furosemide (LASIX) 20 MG tablet, Take 1 tablet (20 mg total) by mouth daily as needed for edema., Disp: 30 tablet, Rfl: 3 .  nystatin (MYCOSTATIN) 100000 UNIT/ML suspension, Take 5 mLs (500,000 Units total) by mouth 4 (four) times daily. Swish and spit, Disp: 60 mL, Rfl: 0 .  oxybutynin (DITROPAN XL) 15 MG 24 hr tablet, Take 15 mg by mouth at bedtime. , Disp: , Rfl:  .  pentosan polysulfate (ELMIRON) 100 MG capsule, Take 200 mg by mouth 2 (two) times daily. , Disp: , Rfl:  .  potassium chloride (K-DUR) 10 MEQ tablet, Take one tab PO daily when you take fluid pill, Disp: 30 tablet, Rfl: 3 .  ULTRAM 50 MG tablet, TAKE 1 TABLET BY MOUTH EVERY 8 HOURS AS NEEDED FOR PAIN, Disp: 90 tablet, Rfl: 1 .  VENTOLIN HFA 108 (90 Base) MCG/ACT inhaler, INHALE TWO PUFFS INTO THE LUNGS EVERY 4 HOURS AS NEEDED FOR WHEEZING OR SHORTNESS OF BREATH, Disp: 18 g, Rfl: 5 .  verapamil (CALAN-SR) 180 MG CR tablet, TAKE ONE-HALF TABLET BY MOUTH DAILY, Disp: 45 tablet, Rfl: 1 .  doxepin (SINEQUAN) 50 MG capsule, TAKE 1 CAPSULE BY MOUTH EVERY DAY. (Patient not taking:  Reported on 12/31/2017), Disp: 90 capsule, Rfl: 1  Review of Systems  Constitutional: Negative.   Respiratory: Negative.   Cardiovascular: Negative.   Gastrointestinal: Negative.   Genitourinary: Negative for dysuria, enuresis, flank pain, frequency, hematuria and pelvic pain.       Urine odor    Social History   Tobacco Use  . Smoking status: Never Smoker  . Smokeless tobacco: Never Used  Substance Use Topics  . Alcohol use: No   Objective:   BP 120/76   Pulse 85   Temp 98.4 F (36.9 C) (Oral)   Wt 119 lb 12.8 oz (54.3 kg)   SpO2 90%   BMI 23.40 kg/m    Physical Exam  Constitutional: She is oriented to person, place, and time. She appears well-developed and well-nourished. No distress.  Cardiovascular: Normal rate, regular rhythm and  normal heart sounds. Exam reveals no gallop and no friction rub.  No murmur heard. Pulmonary/Chest: Effort normal. No respiratory distress. She has no decreased breath sounds. She has wheezes. She has no rhonchi. She has no rales.  Abdominal: Soft. Normal appearance and bowel sounds are normal. She exhibits no distension and no mass. There is no hepatosplenomegaly. There is tenderness in the suprapubic area. There is no rebound, no guarding and no CVA tenderness.  Neurological: She is alert and oriented to person, place, and time.  Skin: Skin is warm and dry. She is not diaphoretic.  Vitals reviewed.       Assessment & Plan:     1. Bad odor of urine UA was normal. Will send urine for culture and will treat if bacteria growth occurs.  - POCT Urinalysis Dipstick - Urine Culture  2. Age-related osteoporosis with current pathological fracture with routine healing, subsequent encounter Patient has known osteoporosis with thoracic compression fractures. Interested in starting prolia, or similar osteoporosis treatment. Referral placed to endocrine for treatment options.  - Ambulatory referral to Endocrinology  3. Moderate persistent asthma without complication Wheezing noted on exam today. Advised patient to increase Advair to BID use as she should be doing vs once daily as she has been doing. She has albuterol for prn. She is to call if wheezing and SOB do not improve.        Mar Daring, PA-C  [facility]

## 2018-01-01 ENCOUNTER — Encounter: Payer: Self-pay | Admitting: Physician Assistant

## 2018-01-02 ENCOUNTER — Telehealth: Payer: Self-pay

## 2018-01-02 LAB — URINE CULTURE

## 2018-01-02 NOTE — Telephone Encounter (Signed)
Patient advised as below.  

## 2018-01-02 NOTE — Telephone Encounter (Signed)
-----   Message from Mar Daring, PA-C sent at 01/02/2018  8:33 AM EST ----- Urine culture was negative.

## 2018-01-09 DIAGNOSIS — L853 Xerosis cutis: Secondary | ICD-10-CM | POA: Diagnosis not present

## 2018-01-09 DIAGNOSIS — D2261 Melanocytic nevi of right upper limb, including shoulder: Secondary | ICD-10-CM | POA: Diagnosis not present

## 2018-01-09 DIAGNOSIS — D692 Other nonthrombocytopenic purpura: Secondary | ICD-10-CM | POA: Diagnosis not present

## 2018-01-09 DIAGNOSIS — Z85828 Personal history of other malignant neoplasm of skin: Secondary | ICD-10-CM | POA: Diagnosis not present

## 2018-01-14 ENCOUNTER — Telehealth: Payer: Self-pay | Admitting: Physician Assistant

## 2018-01-14 DIAGNOSIS — I83893 Varicose veins of bilateral lower extremities with other complications: Secondary | ICD-10-CM

## 2018-01-14 NOTE — Telephone Encounter (Signed)
Referral placed.

## 2018-01-14 NOTE — Telephone Encounter (Signed)
Please Review.  Thanks,  -Xochil Shanker 

## 2018-01-14 NOTE — Telephone Encounter (Signed)
Pt stated that she was Dx with venous insufficiency and her sister suggested she see Dr. Lucky Cowboy with [facility] [phone]. Pt also stated that she has tried to wear the stockings Tawanna Sat suggested but she is having difficulty getting them to stay up. Pt thought that Dr. Lucky Cowboy might have suggestions on how to help her and is requesting a referral. Please advise. Thanks TNP

## 2018-02-02 ENCOUNTER — Ambulatory Visit: Payer: Medicare HMO | Admitting: Physician Assistant

## 2018-02-05 ENCOUNTER — Encounter: Payer: Self-pay | Admitting: Physician Assistant

## 2018-02-05 ENCOUNTER — Ambulatory Visit: Payer: Medicare HMO | Admitting: Physician Assistant

## 2018-02-05 VITALS — BP 110/68 | HR 68 | Temp 98.5°F | Resp 16 | Wt 117.2 lb

## 2018-02-05 DIAGNOSIS — IMO0002 Reserved for concepts with insufficient information to code with codable children: Secondary | ICD-10-CM

## 2018-02-05 DIAGNOSIS — M546 Pain in thoracic spine: Secondary | ICD-10-CM | POA: Diagnosis not present

## 2018-02-05 DIAGNOSIS — Q782 Osteopetrosis: Secondary | ICD-10-CM | POA: Diagnosis not present

## 2018-02-05 DIAGNOSIS — Z9889 Other specified postprocedural states: Secondary | ICD-10-CM | POA: Diagnosis not present

## 2018-02-05 DIAGNOSIS — M5416 Radiculopathy, lumbar region: Secondary | ICD-10-CM | POA: Diagnosis not present

## 2018-02-05 DIAGNOSIS — M4854XD Collapsed vertebra, not elsewhere classified, thoracic region, subsequent encounter for fracture with routine healing: Secondary | ICD-10-CM

## 2018-02-05 DIAGNOSIS — C4492 Squamous cell carcinoma of skin, unspecified: Secondary | ICD-10-CM

## 2018-02-05 DIAGNOSIS — F5101 Primary insomnia: Secondary | ICD-10-CM

## 2018-02-05 DIAGNOSIS — R69 Illness, unspecified: Secondary | ICD-10-CM | POA: Diagnosis not present

## 2018-02-05 NOTE — Progress Notes (Signed)
Patient: Jackie Turner Female    DOB: Dec 22, 1935   82 y.o.   MRN: [id number] Visit Date: 02/05/2018  Today's Provider: Mar Daring, PA-C   Chief Complaint  Patient presents with  . Back Pain  . Insomnia  . Follow-up   Subjective:    HPI Patient here today to F/U on compression fracture of T10. Patient had surgery in July of 2018. Patient reports stabbing pain in her upper back. Patient reports taking Tramadol 50 mg BID. Patient reports resting usually helps with pain. Patient reports that she called her surgeons office and they recommended for patient to talk to PCP and try to have MRI ordered.   Patient C/O not being able to fall asleep. Patient reports symptoms have been like this since back fracture. Patient denies pain causing her to not be able to sleep. Patient denies taking any medications to help with sleep. Patient reports she has tried taking 60 mg of doxepin and helped with sleep. Patient reports that taking 60 mg made her very groggy. Patient reports that now she taking 10, 20, or 30 mg of doxepin as needed.   Patient reports she had skin cancer removed in Oct. Of 2018 at [facility]. Patient reports that she was advised to check area frequently with PCP.      Allergies  Allergen Reactions  . Nsaids Other (See Comments)    History of gastric ulcer  . Amoxicillin Nausea Only and Rash  . Ibandronic Acid Other (See Comments)    Muscle weakness - advised not to take  **BONIVA** - drug name  . Actonel  [Risedronate] Other (See Comments)    Gastric ulcers  . Antihistamines, Chlorpheniramine-Type   . Aspirin     Gastric ulcer  . Buspar [Buspirone] Other (See Comments)    Pt does not remember reaction   . Butalbital-Aspirin-Caffeine Other (See Comments)    Other reaction(s): Unknown  . Clarithromycin Other (See Comments)    Bloating  *BIAXIN*  . Codeine   . Gatifloxacin Other (See Comments)  . Hydrocodone-Guaifenesin Nausea Only   CODICLEAR DH STYRUP   . Moxifloxacin   . Oxycodone Other (See Comments)    Dizziness  . Penicillins Other (See Comments)  . Risedronate Sodium     Gastric ulcer  . Tylenol With Codeine #3  [Acetaminophen-Codeine]     Other reaction(s): Unknown  . Raloxifene Other (See Comments)    Bloating Bloating  *EVISTA*     Current Outpatient Medications:  .  ADVAIR DISKUS 250-50 MCG/DOSE AEPB, INHALE 1 PUFF INTO THE LUNGS 2 TIMES DAILY, Disp: 60 each, Rfl: 5 .  Calcium Carbonate-Vitamin D 600-400 MG-UNIT per tablet, Take 1 tablet by mouth 2 (two) times daily. , Disp: , Rfl:  .  dicyclomine (BENTYL) 10 MG capsule, TAKE 1 CAPSULE BY MOUTH 3 TIMES A DAY ASNEEDED FOR SPASMS, Disp: 120 capsule, Rfl: 5 .  doxepin (SINEQUAN) 10 MG capsule, TAKE 1 CAPSULE BY MOUTH EVERY DAY., Disp: 90 capsule, Rfl: 1 .  fluticasone (FLONASE) 50 MCG/ACT nasal spray, Place 2 sprays into both nostrils daily. , Disp: , Rfl:  .  furosemide (LASIX) 20 MG tablet, Take 1 tablet (20 mg total) by mouth daily as needed for edema., Disp: 30 tablet, Rfl: 3 .  nystatin (MYCOSTATIN) 100000 UNIT/ML suspension, Take 5 mLs (500,000 Units total) by mouth 4 (four) times daily. Swish and spit, Disp: 60 mL, Rfl: 0 .  oxybutynin (DITROPAN XL) 15 MG 24  hr tablet, Take 15 mg by mouth at bedtime. , Disp: , Rfl:  .  pentosan polysulfate (ELMIRON) 100 MG capsule, Take 200 mg by mouth 2 (two) times daily. , Disp: , Rfl:  .  potassium chloride (K-DUR) 10 MEQ tablet, Take one tab PO daily when you take fluid pill, Disp: 30 tablet, Rfl: 3 .  ULTRAM 50 MG tablet, TAKE 1 TABLET BY MOUTH EVERY 8 HOURS AS NEEDED FOR PAIN, Disp: 90 tablet, Rfl: 1 .  VENTOLIN HFA 108 (90 Base) MCG/ACT inhaler, INHALE TWO PUFFS INTO THE LUNGS EVERY 4 HOURS AS NEEDED FOR WHEEZING OR SHORTNESS OF BREATH, Disp: 18 g, Rfl: 5 .  verapamil (CALAN-SR) 180 MG CR tablet, TAKE ONE-HALF TABLET BY MOUTH DAILY, Disp: 45 tablet, Rfl: 1 .  doxepin (SINEQUAN) 50 MG capsule, TAKE 1 CAPSULE BY  MOUTH EVERY DAY. (Patient not taking: Reported on 02/05/2018), Disp: 90 capsule, Rfl: 1  Review of Systems  Constitutional: Negative.   Respiratory: Negative.   Cardiovascular: Negative.   Musculoskeletal: Positive for back pain.  Neurological: Negative.     Social History   Tobacco Use  . Smoking status: Never Smoker  . Smokeless tobacco: Never Used  Substance Use Topics  . Alcohol use: No   Objective:   BP 110/68 (BP Location: Left Arm, Patient Position: Sitting, Cuff Size: Normal)   Pulse 68   Temp 98.5 F (36.9 C) (Oral)   Resp 16   Wt 117 lb 3.2 oz (53.2 kg)   SpO2 97%   BMI 22.89 kg/m  Vitals:   02/05/18 1348  BP: 110/68  Pulse: 68  Resp: 16  Temp: 98.5 F (36.9 C)  TempSrc: Oral  SpO2: 97%  Weight: 117 lb 3.2 oz (53.2 kg)     Physical Exam  Constitutional: She appears well-developed and well-nourished. No distress.  Neck: Normal range of motion. Neck supple. No tracheal deviation present. No thyromegaly present.  Cardiovascular: Normal rate, regular rhythm and normal heart sounds. Exam reveals no gallop and no friction rub.  No murmur heard. Pulmonary/Chest: Effort normal and breath sounds normal. No respiratory distress. She has no wheezes. She has no rales.  Musculoskeletal:       Thoracic back: She exhibits decreased range of motion, tenderness and bony tenderness. She exhibits no spasm.       Lumbar back: She exhibits decreased range of motion, tenderness and pain.  Lymphadenopathy:    She has no cervical adenopathy.  Skin: She is not diaphoretic.     Psychiatric: She has a normal mood and affect. Her behavior is normal. Judgment and thought content normal.  Vitals reviewed.       Assessment & Plan:     1. Primary insomnia Stable on Doxepin. Only uses prn. Has been having to sleep in recliner due to back pain which also causes issues sleeping. She reports she doesn't like to take other medications for her sleep as they all just make her to  groggy.  - doxepin (SINEQUAN) 50 MG capsule; TAKE 1 CAPSULE BY MOUTH EVERY DAY.  2. Osteopetrosis Had T9 and T10 compression fracture last march with worsening compression fracture in May. Underwent Kyphoplasty with Dr. Kathee Delton with [facility]. No having worsening thoracic spine pain. Suspect compression fracture possibly above or below kyphoplasty. Dr. Estanislado Pandy is requesting [place] try to get the MRI so he can plan a future kyphoplasty if required.  - MR Thoracic Spine Wo Contrast; Future  3. Squamous cell carcinoma Underwent removal in 09/2017. Surgical  incision is well healed. Will monitor.   4. Thoracic spine pain See above medical treatment plan for #2.  - MR Thoracic Spine Wo Contrast; Future  5. Non-traumatic compression fracture of T10 thoracic vertebra with routine healing, subsequent encounter - MR Thoracic Spine Wo Contrast; Future  6. S/P kyphoplasty - MR Thoracic Spine Wo Contrast; Future  7. Lumbar radiculopathy Followed by Dr. Pearline Cables. Gets ESI every 4 months. Last one unsuccessful per patient. Dr. Pearline Cables is requesting MRI copy as well.        Mar Daring, PA-C  [facility]

## 2018-02-10 ENCOUNTER — Encounter (INDEPENDENT_AMBULATORY_CARE_PROVIDER_SITE_OTHER): Payer: Self-pay | Admitting: Vascular Surgery

## 2018-02-10 ENCOUNTER — Ambulatory Visit (INDEPENDENT_AMBULATORY_CARE_PROVIDER_SITE_OTHER): Payer: Medicare HMO | Admitting: Vascular Surgery

## 2018-02-10 VITALS — BP 156/84 | HR 82 | Resp 18 | Ht <= 58 in | Wt 118.0 lb

## 2018-02-10 DIAGNOSIS — M5416 Radiculopathy, lumbar region: Secondary | ICD-10-CM

## 2018-02-10 DIAGNOSIS — E785 Hyperlipidemia, unspecified: Secondary | ICD-10-CM | POA: Diagnosis not present

## 2018-02-10 DIAGNOSIS — M7989 Other specified soft tissue disorders: Secondary | ICD-10-CM | POA: Insufficient documentation

## 2018-02-10 DIAGNOSIS — I83893 Varicose veins of bilateral lower extremities with other complications: Secondary | ICD-10-CM | POA: Diagnosis not present

## 2018-02-10 NOTE — Progress Notes (Signed)
Patient ID: Jackie Turner, female   DOB: 1936-07-15, 82 y.o.   MRN: [id number]  Chief Complaint  Patient presents with  . New Patient (Initial Visit)    Varicose veins    HPI Jackie Turner is a 82 y.o. female.  I am asked to see the patient by Fenton Malling, PA-C for evaluation of painful varicose veins.  The patient presents with complaints of symptomatic varicosities of the lower extremities.  This started after her mobility was markedly decreased with a recent back injury a few months ago.  She was also drinking boost which was high in sodium which increased her swelling.  Her veins became more prominent and noticeable with this event.  The right leg is more severly affected. The patient elevates the legs for relief. The pain is described as heaviness. The symptoms are generally most severe in the evening, particularly when they have been on their feet for long periods of time.  Compression stockings have been used to try to improve the symptoms with some success. The patient complains of swelling as an associated symptom, although she has gotten this under better control with the compression stockings and the restriction of sodium. The patient has no previous history of deep venous thrombosis or superficial thrombophlebitis to their knowledge.     Past Medical History:  Diagnosis Date  . Allergy   . Asthma   . Hyperlipidemia   . Osteoporosis     Past Surgical History:  Procedure Laterality Date  . ABDOMINAL HYSTERECTOMY  2003  . BREAST BIOPSY  1976, 1978   benign  . carpal tunnel repair Bilateral    R 1990; L 1997  . EYE SURGERY Bilateral    cataract extraction  . HAND TENDON SURGERY Right    Trigger finger  . IR RADIOLOGIST EVAL & MGMT  06/13/2017  . IR VERTEBROPLASTY CERV/THOR BX INC UNI/BIL INC/INJECT/IMAGING  06/17/2017  . TONSILLECTOMY  1961    Family History  Problem Relation Age of Onset  . Heart disease Mother   . CAD Mother   . Congestive  Heart Failure Mother   . Osteoarthritis Mother   . Cancer Sister        breast  . Breast cancer Sister      Social History Social History   Tobacco Use  . Smoking status: Never Smoker  . Smokeless tobacco: Never Used  Substance Use Topics  . Alcohol use: No  . Drug use: No    Allergies  Allergen Reactions  . Nsaids Other (See Comments)    History of gastric ulcer  . Amoxicillin Nausea Only and Rash  . Ibandronic Acid Other (See Comments)    Muscle weakness - advised not to take  **BONIVA** - drug name  . Actonel  [Risedronate] Other (See Comments)    Gastric ulcers  . Antihistamines, Chlorpheniramine-Type   . Aspirin     Gastric ulcer  . Buspar [Buspirone] Other (See Comments)    Pt does not remember reaction   . Butalbital-Aspirin-Caffeine Other (See Comments)    Other reaction(s): Unknown  . Clarithromycin Other (See Comments)    Bloating  *BIAXIN*  . Codeine   . Gatifloxacin Other (See Comments)  . Hydrocodone-Guaifenesin Nausea Only    CODICLEAR DH STYRUP   . Moxifloxacin   . Oxycodone Other (See Comments)    Dizziness  . Penicillins Other (See Comments)  . Risedronate Sodium     Gastric ulcer  . Tylenol With Codeine #3  [  Acetaminophen-Codeine]     Other reaction(s): Unknown  . Raloxifene Other (See Comments)    Bloating Bloating  *EVISTA*    Current Outpatient Medications  Medication Sig Dispense Refill  . ADVAIR DISKUS 250-50 MCG/DOSE AEPB INHALE 1 PUFF INTO THE LUNGS 2 TIMES DAILY 60 each 5  . Calcium Carbonate-Vitamin D 600-400 MG-UNIT per tablet Take 1 tablet by mouth 2 (two) times daily.     Marland Kitchen dicyclomine (BENTYL) 10 MG capsule TAKE 1 CAPSULE BY MOUTH 3 TIMES A DAY ASNEEDED FOR SPASMS 120 capsule 5  . doxepin (SINEQUAN) 10 MG capsule TAKE 1 CAPSULE BY MOUTH EVERY DAY. 90 capsule 1  . doxepin (SINEQUAN) 50 MG capsule TAKE 1 CAPSULE BY MOUTH EVERY DAY.    . fluticasone (FLONASE) 50 MCG/ACT nasal spray Place 2 sprays into both nostrils daily.      . furosemide (LASIX) 20 MG tablet Take 1 tablet (20 mg total) by mouth daily as needed for edema. 30 tablet 3  . nystatin (MYCOSTATIN) 100000 UNIT/ML suspension Take 5 mLs (500,000 Units total) by mouth 4 (four) times daily. Swish and spit 60 mL 0  . oxybutynin (DITROPAN XL) 15 MG 24 hr tablet Take 15 mg by mouth at bedtime.     . pentosan polysulfate (ELMIRON) 100 MG capsule Take 200 mg by mouth 2 (two) times daily.     . potassium chloride (K-DUR) 10 MEQ tablet Take one tab PO daily when you take fluid pill 30 tablet 3  . ULTRAM 50 MG tablet TAKE 1 TABLET BY MOUTH EVERY 8 HOURS AS NEEDED FOR PAIN 90 tablet 1  . VENTOLIN HFA 108 (90 Base) MCG/ACT inhaler INHALE TWO PUFFS INTO THE LUNGS EVERY 4 HOURS AS NEEDED FOR WHEEZING OR SHORTNESS OF BREATH 18 g 5  . verapamil (CALAN-SR) 180 MG CR tablet TAKE ONE-HALF TABLET BY MOUTH DAILY 45 tablet 1   No current facility-administered medications for this visit.       REVIEW OF SYSTEMS (Negative unless checked)  Constitutional: [] Weight loss  [] Fever  [] Chills Cardiac: [] Chest pain   [] Chest pressure   [] Palpitations   [] Shortness of breath when laying flat   [] Shortness of breath at rest   [] Shortness of breath with exertion. Vascular:  [] Pain in legs with walking   [] Pain in legs at rest   [] Pain in legs when laying flat   [] Claudication   [] Pain in feet when walking  [] Pain in feet at rest  [] Pain in feet when laying flat   [] History of DVT   [] Phlebitis   [x] Swelling in legs   [x] Varicose veins   [] Non-healing ulcers Pulmonary:   [] Uses home oxygen   [] Productive cough   [] Hemoptysis   [] Wheeze  [] COPD   [] Asthma Neurologic:  [] Dizziness  [] Blackouts   [] Seizures   [] History of stroke   [] History of TIA  [] Aphasia   [] Temporary blindness   [] Dysphagia   [] Weakness or numbness in arms   [] Weakness or numbness in legs Musculoskeletal:  [x] Arthritis   [] Joint swelling   [] Joint pain   [x] Low back pain Hematologic:  [] Easy bruising  [] Easy bleeding    [] Hypercoagulable state   [] Anemic  [] Hepatitis Gastrointestinal:  [] Blood in stool   [] Vomiting blood  [] Gastroesophageal reflux/heartburn   [] Abdominal pain Genitourinary:  [] Chronic kidney disease   [] Difficult urination  [] Frequent urination  [] Burning with urination   [] Hematuria Skin:  [] Rashes   [] Ulcers   [] Wounds Psychological:  [] History of anxiety   []  History of major depression.  Physical Exam BP (!) 156/84 (BP Location: Right Arm, Patient Position: Sitting)   Pulse 82   Resp 18   Ht 4\' 10"  (1.473 m)   Wt 53.5 kg (118 lb)   BMI 24.66 kg/m  Gen:  WD/WN, NAD.  Appears younger than stated age Head: [place]/AT, No temporalis wasting.  Ear/Nose/Throat: Hearing grossly intact, oropharynx clear Eyes: Sclera non-icteric. Conjunctiva clear Neck: Supple, no nuchal rigidity. Trachea midline Pulmonary:  Good air movement, no use of accessory muscles, respirations not labored.  Cardiac: RRR, No JVD Vascular: Varicosities diffuse and measuring up to 2-3 mm in the right lower extremity        Varicosities scattered and measuring up to 1-2 mm in the left lower extremity Vessel Right Left  Radial Palpable Palpable                          PT Palpable Palpable  DP Palpable Palpable   Gastrointestinal: soft, non-tender/non-distended Musculoskeletal: M/S 5/5 throughout.   Trace RLE edema.  No LLE edema Neurologic: Sensation grossly intact in extremities.  Symmetrical.  Speech is fluent.  Psychiatric: Judgment intact, Mood & affect appropriate for pt's clinical situation. Dermatologic: No rashes or ulcers noted.  No cellulitis or open wounds.    Radiology No results found.  Labs Recent Results (from the past 2160 hour(s))  POCT Urinalysis Dipstick     Status: None   Collection Time: 12/31/17  4:46 PM  Result Value Ref Range   Color, UA yellow    Clarity, UA clear    Glucose, UA negative    Bilirubin, UA negative    Ketones, UA negative    Spec Grav, UA 1.020 1.010 -  1.025   Blood, UA negative    pH, UA 6.0 5.0 - 8.0   Protein, UA negative    Urobilinogen, UA 0.2 0.2 or 1.0 E.U./dL   Nitrite, UA negative    Leukocytes, UA Negative Negative   Appearance     Odor    Urine Culture     Status: None   Collection Time: 12/31/17  4:54 PM  Result Value Ref Range   Urine Culture, Routine Final report    Organism ID, Bacteria Comment     Comment: Mixed urogenital flora 10,000-25,000 colony forming units per mL     Assessment/Plan:  Lumbar radiculopathy Could certainly exacerbate her LE symptoms.  Her initial back injury certainly seem to bring the symptoms on and she has an upcoming back surgery which may make the symptoms worse.  Hyperlipidemia lipid control important in reducing the progression of atherosclerotic disease.   Swelling of limb Likely multifactorial but her decreased activity in association with likely venous insufficiency is probably creating a situation promoting swelling.  Continue to wear her compression stockings, elevate her legs, and activity as tolerated.  Varicose veins of leg with swelling, bilateral   The patient has symptoms consistent with chronic venous insufficiency. We discussed the natural history and treatment options for venous disease. I recommended the regular use of 20 - 30 mm Hg compression stockings, and prescribed these today. I recommended leg elevation and anti-inflammatories as needed for pain. I have also recommended a complete venous duplex to assess the venous system for reflux or thrombotic issues. This can be done at the patient's convenience. I will see the patient back in 3 months to assess the response to conservative management, and determine further treatment options.     Leotis Pain 02/10/2018, 11:36  AM   This note was created with Dragon medical transcription system.  Any errors from dictation are unintentional.

## 2018-02-10 NOTE — Patient Instructions (Signed)

## 2018-02-10 NOTE — Assessment & Plan Note (Signed)
Likely multifactorial but her decreased activity in association with likely venous insufficiency is probably creating a situation promoting swelling.  Continue to wear her compression stockings, elevate her legs, and activity as tolerated.

## 2018-02-10 NOTE — Assessment & Plan Note (Addendum)
Could certainly exacerbate her LE symptoms.  Her initial back injury certainly seem to bring the symptoms on and she has an upcoming back surgery which may make the symptoms worse.

## 2018-02-10 NOTE — Assessment & Plan Note (Signed)
lipid control important in reducing the progression of atherosclerotic disease.   

## 2018-02-11 ENCOUNTER — Telehealth: Payer: Self-pay | Admitting: Physician Assistant

## 2018-02-11 DIAGNOSIS — Z1231 Encounter for screening mammogram for malignant neoplasm of breast: Secondary | ICD-10-CM | POA: Diagnosis not present

## 2018-02-11 LAB — HM MAMMOGRAPHY

## 2018-02-11 NOTE — Telephone Encounter (Signed)
Approved MRI: [id number]

## 2018-02-18 ENCOUNTER — Ambulatory Visit
Admission: RE | Admit: 2018-02-18 | Discharge: 2018-02-18 | Disposition: A | Discharge status: Home / Self Care | Payer: Medicare HMO | Source: Ambulatory Visit | Attending: Physician Assistant | Admitting: Physician Assistant

## 2018-02-18 DIAGNOSIS — M5124 Other intervertebral disc displacement, thoracic region: Secondary | ICD-10-CM | POA: Diagnosis not present

## 2018-02-18 DIAGNOSIS — Q782 Osteopetrosis: Secondary | ICD-10-CM | POA: Diagnosis not present

## 2018-02-18 DIAGNOSIS — Z9889 Other specified postprocedural states: Secondary | ICD-10-CM | POA: Diagnosis not present

## 2018-02-18 DIAGNOSIS — M546 Pain in thoracic spine: Secondary | ICD-10-CM | POA: Diagnosis not present

## 2018-02-18 DIAGNOSIS — X58XXXD Exposure to other specified factors, subsequent encounter: Secondary | ICD-10-CM | POA: Diagnosis not present

## 2018-02-18 DIAGNOSIS — M4854XD Collapsed vertebra, not elsewhere classified, thoracic region, subsequent encounter for fracture with routine healing: Secondary | ICD-10-CM

## 2018-02-19 ENCOUNTER — Telehealth: Payer: Self-pay

## 2018-02-19 ENCOUNTER — Telehealth: Payer: Self-pay | Admitting: Physician Assistant

## 2018-02-19 DIAGNOSIS — K580 Irritable bowel syndrome with diarrhea: Secondary | ICD-10-CM

## 2018-02-19 DIAGNOSIS — F5101 Primary insomnia: Secondary | ICD-10-CM

## 2018-02-19 MED ORDER — DOXEPIN HCL 10 MG PO CAPS: 30.0000 mg | ORAL_CAPSULE | Freq: Every day | ORAL | 1 refills | Status: DC

## 2018-02-19 NOTE — Telephone Encounter (Signed)
-----   Message from Mar Daring, [place] sent at 02/18/2018  4:23 PM EST ----- MRI appears to be unchanged from previous from 04/2017. I will print and fax to your back doctor.

## 2018-02-19 NOTE — Telephone Encounter (Signed)
Please Review.  Thanks,  -Davarius Ridener 

## 2018-02-19 NOTE — Telephone Encounter (Signed)
Refilled to [facility]

## 2018-02-19 NOTE — Telephone Encounter (Signed)
error 

## 2018-02-19 NOTE — Telephone Encounter (Signed)
Patient advised as directed below.  Thanks,  -Joseline 

## 2018-02-19 NOTE — Telephone Encounter (Signed)
Pt needing refill on Doxepin 10 Mg sent to [facility].  Pt states she has not been able to take the 50MG  at night because she was getting too sleepy.  Pt states she has been taking 3 - 10Mg  instead and that is why she ran out early.

## 2018-02-20 ENCOUNTER — Other Ambulatory Visit: Payer: Self-pay | Admitting: Physician Assistant

## 2018-02-20 ENCOUNTER — Encounter: Payer: Self-pay | Admitting: Physician Assistant

## 2018-02-20 DIAGNOSIS — J4530 Mild persistent asthma, uncomplicated: Secondary | ICD-10-CM

## 2018-02-23 ENCOUNTER — Telehealth: Payer: Self-pay | Admitting: Physician Assistant

## 2018-02-23 ENCOUNTER — Telehealth (HOSPITAL_COMMUNITY): Payer: Self-pay

## 2018-02-23 DIAGNOSIS — S22000D Wedge compression fracture of unspecified thoracic vertebra, subsequent encounter for fracture with routine healing: Secondary | ICD-10-CM

## 2018-02-23 MED ORDER — ULTRAM 50 MG PO TABS: 50.0000 mg | ORAL_TABLET | Freq: Three times a day (TID) | ORAL | 1 refills | Status: DC | PRN

## 2018-02-23 NOTE — Telephone Encounter (Signed)
Yes. New Rx sent in.

## 2018-02-23 NOTE — Telephone Encounter (Signed)
Pt agreed to f/u with PCP since she had no new fractures. AW

## 2018-02-23 NOTE — Telephone Encounter (Signed)
Tried calling pt to advise. No answer, will try again later.

## 2018-02-23 NOTE — Telephone Encounter (Signed)
Please advise 

## 2018-02-23 NOTE — Telephone Encounter (Signed)
Jackie Turner had an MRI done recently.  Dr. Estanislado Pandy told her it only showed an old compression fracture and not sure he can do anything for it.   She gets steroid injections in her back at [facility] by Dr. Lorelle Gibbs for 8 years and will be getting one on April 4th.   Jackie Turner wants to know if you will continue to prescribe Ultram for her until she can see Dr. Pearline Cables in April.

## 2018-02-24 NOTE — Telephone Encounter (Signed)
Patient advised that prescription was sent in yesterday.  Thanks,  -Daniel Johndrow

## 2018-02-26 ENCOUNTER — Telehealth: Payer: Self-pay

## 2018-02-26 NOTE — Telephone Encounter (Signed)
Noted  

## 2018-02-26 NOTE — Telephone Encounter (Signed)
FYI: Joy from [facility] states that patient didn't want to wait on the PA for Brand name Ultram, so she picked up the generic today. Questions call [phone]

## 2018-03-03 ENCOUNTER — Telehealth: Payer: Self-pay | Admitting: Physician Assistant

## 2018-03-03 DIAGNOSIS — S22000D Wedge compression fracture of unspecified thoracic vertebra, subsequent encounter for fracture with routine healing: Secondary | ICD-10-CM

## 2018-03-03 NOTE — Telephone Encounter (Signed)
Patient states that she was on Ultram and last time she went to get it filled they were wanting PA and she didn't want to fight with the insurance company about it and decided she would try what they recommended again although she didn't think it would work for her because she has tried it in the past and it didn't help her.  She states that you sent in Tramadol and she very sleepy with it, not fully alert feeling and she is scared to drive while she is taking it. She took it last night around 9pm last night and still feels this way.  She would like to know what you think she needs to do.  She states that she would like to go back on the brand name Ultram if that is possible because she did not have any problems with it.  She uses [facility].

## 2018-03-03 NOTE — Telephone Encounter (Signed)
Pts insurance will not pay for name brand Tramadol.   Pharmacy needs ok to use generic.

## 2018-03-04 MED ORDER — ULTRAM 50 MG PO TABS: 50.0000 mg | ORAL_TABLET | Freq: Three times a day (TID) | ORAL | 1 refills | Status: DC | PRN

## 2018-03-04 MED ORDER — TRAMADOL HCL 50 MG PO TABS: 50.0000 mg | ORAL_TABLET | Freq: Three times a day (TID) | ORAL | 1 refills | Status: DC | PRN

## 2018-03-04 NOTE — Telephone Encounter (Signed)
[facility] to cancel generic Ultram.

## 2018-03-04 NOTE — Telephone Encounter (Signed)
Patient states that she called her insurance company last night and they told her that they have approved her to have the Ultram for a year and will be sending you a fax.

## 2018-03-04 NOTE — Telephone Encounter (Signed)
Sent in ok for generic

## 2018-03-04 NOTE — Telephone Encounter (Signed)
See previous message.  It is concerning this.

## 2018-03-04 NOTE — Telephone Encounter (Signed)
Please call to cancel generic. PA approved for one year for Ultram brand

## 2018-03-05 ENCOUNTER — Telehealth: Payer: Self-pay | Admitting: *Deleted

## 2018-03-05 ENCOUNTER — Telehealth: Payer: Self-pay | Admitting: Physician Assistant

## 2018-03-05 DIAGNOSIS — S22000D Wedge compression fracture of unspecified thoracic vertebra, subsequent encounter for fracture with routine healing: Secondary | ICD-10-CM

## 2018-03-05 MED ORDER — ULTRAM 50 MG PO TABS: 50.0000 mg | ORAL_TABLET | Freq: Three times a day (TID) | ORAL | 1 refills | Status: DC | PRN

## 2018-03-05 NOTE — Telephone Encounter (Signed)
Patient called and states she was suppose to have a RX sent to her pharmacy for Ultram not the generic. Patient states she called the pharmacy this afternoon and they have not received the RX. Patient is requesting we send it again. Patient pharmacy is [facility]. Please advise. Thank you

## 2018-03-05 NOTE — Addendum Note (Signed)
Addended by: Mar Daring on: 03/05/2018 03:15 PM   Modules accepted: Orders

## 2018-03-05 NOTE — Telephone Encounter (Signed)
Sent in

## 2018-03-05 NOTE — Telephone Encounter (Signed)
Patient is not tolerating generic Ultram. From previous message on 03/03/2018 patient advised [place] that her insurance had approved brand name Ultram for 1 year. I called pharmacy to see if they could dispense the last prescription written as brand name. The pharmacist states they need a new prescription for brand  name because patient already picked up the generic last week. Pharmacist is not sure if insurance will pay since she just got the generic prescription filled last week. Please advise.

## 2018-03-05 NOTE — Telephone Encounter (Signed)
Is it ok to fill the Ultram early since she got 90 tablets of Tramadol on 02/26/18?  Pt. Says generic is not working.

## 2018-03-05 NOTE — Telephone Encounter (Signed)
Patient advised that per Tawanna Sat it is ok to start and also called the pharmacy and advised that per provider it is ok to fill the Ultram.  Thanks,  -Joseline

## 2018-03-19 DIAGNOSIS — M48061 Spinal stenosis, lumbar region without neurogenic claudication: Secondary | ICD-10-CM | POA: Diagnosis not present

## 2018-03-26 ENCOUNTER — Other Ambulatory Visit: Payer: Self-pay | Admitting: Physician Assistant

## 2018-03-26 DIAGNOSIS — B370 Candidal stomatitis: Secondary | ICD-10-CM

## 2018-03-26 MED ORDER — NYSTATIN 100000 UNIT/ML MT SUSP: 5.0000 mL | Freq: Four times a day (QID) | OROMUCOSAL | 0 refills | Status: DC

## 2018-03-26 NOTE — Telephone Encounter (Signed)
Pt contacted office for refill request on the following medications:  nystatin (MYCOSTATIN) 100000 UNIT/ML suspension   [facility]  Pt stated that she got a back injection at [facility] on 03/19/18 for her back and the Nystatin helps with discomfort that the injection causes in pt's mouth. Please advise. Thanks TNP

## 2018-04-01 ENCOUNTER — Telehealth: Payer: Self-pay

## 2018-04-01 NOTE — Telephone Encounter (Signed)
LM for pt to CB and schedule AWV prior to CPE on 04/27/18. Need to schedule after 04/24/18 due to insurance.  -MM

## 2018-04-02 NOTE — Telephone Encounter (Signed)
Pt returned call.  I tried to help her but 5/10 is on a Friday and 5/13 is on a Monday  Not sure when I can put her on the schedule.  Thanks [employer]

## 2018-04-02 NOTE — Telephone Encounter (Signed)
Rescheduled CPE and scheduled AWV and CPE for 05/01/18 @ 1 and 140. -MM

## 2018-04-17 ENCOUNTER — Other Ambulatory Visit: Payer: Self-pay | Admitting: Physician Assistant

## 2018-04-17 DIAGNOSIS — I10 Essential (primary) hypertension: Secondary | ICD-10-CM

## 2018-04-27 ENCOUNTER — Encounter: Payer: Self-pay | Admitting: Physician Assistant

## 2018-05-01 ENCOUNTER — Ambulatory Visit (INDEPENDENT_AMBULATORY_CARE_PROVIDER_SITE_OTHER): Payer: Medicare HMO

## 2018-05-01 ENCOUNTER — Encounter: Payer: Self-pay | Admitting: Physician Assistant

## 2018-05-01 ENCOUNTER — Telehealth: Payer: Self-pay | Admitting: Physician Assistant

## 2018-05-01 ENCOUNTER — Ambulatory Visit (INDEPENDENT_AMBULATORY_CARE_PROVIDER_SITE_OTHER): Payer: Medicare HMO | Admitting: Physician Assistant

## 2018-05-01 VITALS — BP 138/68 | HR 88 | Temp 99.4°F | Resp 16 | Ht <= 58 in | Wt 116.0 lb

## 2018-05-01 VITALS — BP 138/68 | HR 88 | Temp 99.4°F | Ht <= 58 in | Wt 116.8 lb

## 2018-05-01 DIAGNOSIS — M8000XG Age-related osteoporosis with current pathological fracture, unspecified site, subsequent encounter for fracture with delayed healing: Secondary | ICD-10-CM

## 2018-05-01 DIAGNOSIS — Z0000 Encounter for general adult medical examination without abnormal findings: Secondary | ICD-10-CM | POA: Diagnosis not present

## 2018-05-01 DIAGNOSIS — K580 Irritable bowel syndrome with diarrhea: Secondary | ICD-10-CM

## 2018-05-01 DIAGNOSIS — E7800 Pure hypercholesterolemia, unspecified: Secondary | ICD-10-CM

## 2018-05-01 DIAGNOSIS — T63301A Toxic effect of unspecified spider venom, accidental (unintentional), initial encounter: Secondary | ICD-10-CM

## 2018-05-01 DIAGNOSIS — S22000D Wedge compression fracture of unspecified thoracic vertebra, subsequent encounter for fracture with routine healing: Secondary | ICD-10-CM

## 2018-05-01 MED ORDER — CEPHALEXIN 500 MG PO CAPS: 500.0000 mg | ORAL_CAPSULE | Freq: Three times a day (TID) | ORAL | 0 refills | Status: DC

## 2018-05-01 MED ORDER — AZITHROMYCIN 250 MG PO TABS: ORAL_TABLET | ORAL | 0 refills | Status: DC

## 2018-05-01 MED ORDER — ULTRAM 50 MG PO TABS: 50.0000 mg | ORAL_TABLET | Freq: Three times a day (TID) | ORAL | 5 refills | Status: DC | PRN

## 2018-05-01 NOTE — Progress Notes (Signed)
Patient: Jackie Turner, Female    DOB: 06/17/1936, 82 y.o.   MRN: [id number] Visit Date: 05/01/2018  Today's Provider: Mar Daring, PA-C   Chief Complaint  Patient presents with  . Medicare Wellness  . Back Pain   Subjective:     Complete Physical Jackie Turner is a 82 y.o. female. She feels fairly well. She reports exercising 3 times a week. She reports she is sleeping fairly well. -----------------------------------------------------------  Patient C/O back pain due to compression fracture that is not operable. Patient reports taking Ultram 50 mg daily and reports taking more than one dose most days. Patient reports that pain wakes her up most nights and she does take half a dose of Ultram to help her go back to sleep.   Patient has a spot on her left leg that is red and has some pus coming out since last week. Patient reports using OTC creams and reports no improvement.    Review of Systems  Constitutional: Negative.   HENT: Positive for sinus pressure.   Eyes: Negative.   Respiratory: Positive for shortness of breath.   Cardiovascular: Negative.   Gastrointestinal: Positive for constipation.  Endocrine: Negative.   Genitourinary: Negative.   Musculoskeletal: Positive for arthralgias and back pain.  Skin: Negative.   Allergic/Immunologic: Negative.   Neurological: Negative.   Hematological: Negative.   Psychiatric/Behavioral: Negative.     Social History   Socioeconomic History  . Marital status: Widowed    Spouse name: [name]  . Number of children: 0  . Years of education: H/S  . Highest education level: 12th grade  Occupational History  . Occupation: Retired  Scientific laboratory technician  . Financial resource strain: Not hard at all  . Food insecurity:    Worry: Never true    Inability: Never true  . Transportation needs:    Medical: No    Non-medical: No  Tobacco Use  . Smoking status: Never Smoker  . Smokeless tobacco: Never Used    Substance and Sexual Activity  . Alcohol use: No  . Drug use: No  . Sexual activity: Never  Lifestyle  . Physical activity:    Days per week: Not on file    Minutes per session: Not on file  . Stress: Only a little  Relationships  . Social connections:    Talks on phone: Not on file    Gets together: Not on file    Attends religious service: Not on file    Active member of club or organization: Not on file    Attends meetings of clubs or organizations: Not on file    Relationship status: Not on file  . Intimate partner violence:    Fear of current or ex partner: Not on file    Emotionally abused: Not on file    Physically abused: Not on file    Forced sexual activity: Not on file  Other Topics Concern  . Not on file  Social History Narrative  . Not on file    Past Medical History:  Diagnosis Date  . Allergy   . Asthma   . Hyperlipidemia   . Osteoporosis      Patient Active Problem List   Diagnosis Date Noted  . Swelling of limb 02/10/2018  . Varicose veins of leg with swelling, bilateral 02/10/2018  . Squamous cell carcinoma 09/22/2017  . Hypoxia 09/27/2015  . Abnormal CXR 09/27/2015  . Asthma with allergic rhinitis 08/31/2015  . Back  ache 08/31/2015  . Chronic interstitial cystitis 08/31/2015  . Grief reaction 08/31/2015  . Hyperlipidemia 06/26/2015  . Migraine 06/26/2015  . Asthma 06/26/2015  . IBS (irritable bowel syndrome) 06/26/2015  . Osteoporosis 06/26/2015  . Fatigue 06/26/2015  . Lumbar radiculopathy 04/27/2013  . Urinary urgency 01/08/2012  . Frequency of urination 01/08/2012    Past Surgical History:  Procedure Laterality Date  . ABDOMINAL HYSTERECTOMY  2003  . BREAST BIOPSY  1976, 1978   benign  . carpal tunnel repair Bilateral    R 1990; L 1997  . EYE SURGERY Bilateral    cataract extraction  . HAND TENDON SURGERY Right    Trigger finger  . IR RADIOLOGIST EVAL & MGMT  06/13/2017  . IR VERTEBROPLASTY CERV/THOR BX INC UNI/BIL  INC/INJECT/IMAGING  06/17/2017  . TONSILLECTOMY  1961    Her family history includes Breast cancer in her sister; CAD in her mother; Cancer in her sister; Congestive Heart Failure in her mother; Heart disease in her mother; Osteoarthritis in her mother.      Current Outpatient Medications:  .  ADVAIR DISKUS 250-50 MCG/DOSE AEPB, INHALE 1 PUFF INTO THE LUNGS 2 TIMES DAILY, Disp: 60 each, Rfl: 5 .  Calcium Carbonate-Vitamin D 600-400 MG-UNIT per tablet, Take 1 tablet by mouth 2 (two) times daily. , Disp: , Rfl:  .  dicyclomine (BENTYL) 10 MG capsule, TAKE 1 CAPSULE BY MOUTH 3 TIMES A DAY ASNEEDED FOR SPASMS, Disp: 120 capsule, Rfl: 5 .  doxepin (SINEQUAN) 10 MG capsule, Take 3 capsules (30 mg total) by mouth at bedtime., Disp: 270 capsule, Rfl: 1 .  fluticasone (FLONASE) 50 MCG/ACT nasal spray, Place 2 sprays into both nostrils daily. , Disp: , Rfl:  .  furosemide (LASIX) 20 MG tablet, Take 1 tablet (20 mg total) by mouth daily as needed for edema., Disp: 30 tablet, Rfl: 3 .  nystatin (MYCOSTATIN) 100000 UNIT/ML suspension, Take 5 mLs (500,000 Units total) by mouth 4 (four) times daily. Swish and spit, Disp: 60 mL, Rfl: 0 .  oxybutynin (DITROPAN XL) 15 MG 24 hr tablet, Take 15 mg by mouth at bedtime. , Disp: , Rfl:  .  pentosan polysulfate (ELMIRON) 100 MG capsule, Take 200 mg by mouth 2 (two) times daily. , Disp: , Rfl:  .  ULTRAM 50 MG tablet, Take 1 tablet (50 mg total) by mouth every 8 (eight) hours as needed. for pain., Disp: 90 tablet, Rfl: 1 .  urea (CARMOL) 40 % CREA, Apply topically., Disp: , Rfl:  .  VENTOLIN HFA 108 (90 Base) MCG/ACT inhaler, INHALE TWO PUFFS INTO THE LUNGS EVERY 4 HOURS AS NEEDED FOR WHEEZING OR SHORTNESS OF BREATH, Disp: 18 g, Rfl: 5 .  verapamil (CALAN-SR) 180 MG CR tablet, TAKE ONE-HALF TABLET BY MOUTH DAILY, Disp: 45 tablet, Rfl: 1  Patient Care Team: Mar Daring, PA-C as PCP - General (Family Medicine) Domingo Pulse, MD as Consulting Physician  (Urology) Solum, Betsey Holiday, MD as Physician Assistant (Endocrinology) Theresa Duty, MD as Consulting Physician (Radiology)     Objective:   Vitals: BP 138/68 (BP Location: Right Arm, Patient Position: Sitting, Cuff Size: Normal)   Pulse 88   Temp 99.4 F (37.4 C) (Oral)   Resp 16   Ht 4\' 10"  (1.473 m)   Wt 116 lb (52.6 kg)   BMI 24.24 kg/m   Physical Exam  Constitutional: She is oriented to person, place, and time. She appears well-developed and well-nourished. No distress.  HENT:  Head: Normocephalic and atraumatic.  Right Ear: Hearing, tympanic membrane, external ear and ear canal normal.  Left Ear: Hearing, tympanic membrane, external ear and ear canal normal.  Nose: Nose normal.  Mouth/Throat: Uvula is midline, oropharynx is clear and moist and mucous membranes are normal. No oropharyngeal exudate.  Eyes: Pupils are equal, round, and reactive to light. Conjunctivae and EOM are normal. Right eye exhibits no discharge. Left eye exhibits no discharge. No scleral icterus.  Neck: Normal range of motion. Neck supple. No JVD present. Carotid bruit is not present. No tracheal deviation present. No thyromegaly present.  Cardiovascular: Normal rate, regular rhythm, normal heart sounds and intact distal pulses. Exam reveals no gallop and no friction rub.  No murmur heard. Pulmonary/Chest: Effort normal and breath sounds normal. No respiratory distress. She has no wheezes. She has no rales. She exhibits no tenderness.  Abdominal: Soft. Bowel sounds are normal. She exhibits no distension and no mass. There is no tenderness. There is no rebound and no guarding.  Musculoskeletal: Normal range of motion. She exhibits no edema or tenderness.  Lymphadenopathy:    She has no cervical adenopathy.  Neurological: She is alert and oriented to person, place, and time.  Skin: Skin is warm and dry. Rash noted. Rash is pustular. She is not diaphoretic.     Psychiatric: She has a normal mood and  affect. Her behavior is normal. Judgment and thought content normal.  Vitals reviewed.   Activities of Daily Living In your present state of health, do you have any difficulty performing the following activities: 05/01/2018 06/17/2017  Hearing? N N  Vision? N N  Difficulty concentrating or making decisions? N N  Walking or climbing stairs? N Y  Dressing or bathing? N N  Doing errands, shopping? N -  Preparing Food and eating ? N -  Using the Toilet? N -  In the past six months, have you accidently leaked urine? N -  Do you have problems with loss of bowel control? N -  Managing your Medications? N -  Managing your Finances? N -  Housekeeping or managing your Housekeeping? N -  Some recent data might be hidden    Fall Risk Assessment Fall Risk  05/01/2018 04/24/2017 04/22/2016  Falls in the past year? No No No     Depression Screen PHQ 2/9 Scores 05/01/2018 05/01/2018 04/24/2017 04/24/2017  PHQ - 2 Score 0 0 0 0  PHQ- 9 Score 0 - 0 -    Cognitive Testing - 6-CIT  Correct? Score   What year is it? yes 0 0 or 4  What month is it? yes 0 0 or 3  Memorize:    Pia Mau,  44,  [address]  [place],      What time is it? (within 1 hour) yes 0 0 or 3  Count backwards from 20 yes 0 0, 2, or 4  Name the months of the year yes 0 0, 2, or 4  Repeat name & address above no 2 0, 2, 4, 6, 8, or 10       TOTAL SCORE  2/28   Interpretation:  Normal  Normal (0-7) Abnormal (8-28)    Assessment & Plan:    Annual Physical Reviewed patient's Family Medical History Reviewed and updated list of patient's medical providers Assessment of cognitive impairment was done Assessed patient's functional ability Established a written schedule for health screening [place] Completed and Reviewed  Exercise Activities and Dietary recommendations Goals    .  DIET - INCREASE WATER INTAKE     Recommend increasing water intake to 4 glasses a day.        Immunization History    Administered Date(s) Administered  . Influenza Split 09/25/2009  . Influenza, High Dose Seasonal PF 09/27/2015, 10/21/2016, 09/22/2017  . Pneumococcal Conjugate-13 02/09/2015  . Pneumococcal Polysaccharide-23 10/07/2003  . Td 05/14/2007    Health Maintenance  Topic Date Due  . TETANUS/TDAP  05/13/2017  . INFLUENZA VACCINE  07/16/2018  . MAMMOGRAM  02/11/2019  . DEXA SCAN  Completed  . PNA vac Low Risk Adult  Completed     Discussed health benefits of physical activity, and encouraged her to engage in regular exercise appropriate for her age and condition.    1. Annual physical exam Normal physical exam today. Will check labs as below and f/u pending lab results. If labs are stable and WNL she will not need to have these rechecked for one year at her next annual physical exam. She is to call the office in the meantime if she has any acute issue, questions or concerns. - CBC with Differential/Platelet - Comprehensive metabolic panel - TSH  2. Closed compression fracture of thoracic vertebra with routine healing, subsequent encounter Had one thoracic compression fracture repaired with kyphoplasty but then had fracture occur below area of kyphoplasty. Felt BMD is too poor to perform another kyphoplasty at this time. Will repeat BMD as below. Patient is due for her next prolia injection (she did miss last injection).  - ULTRAM 50 MG tablet; Take 1 tablet (50 mg total) by mouth every 8 (eight) hours as needed. for pain.  Dispense: 90 tablet; Refill: 5  3. Age-related osteoporosis with current pathological fracture with delayed healing, subsequent encounter See above medical treatment plan. - DG BONE DENSITY (DXA); Future  4. Spider bite wound, accidental or unintentional, initial encounter Noted on lower extremity. Will send in azithromycin. Patient limited in treatment options due to allergies and intolerance.   5. Irritable bowel syndrome with diarrhea Stable. Continue doxepin and  bentyl.   6. Pure hypercholesterolemia Diet controlled. Will check labs as below and f/u pending results. - Comprehensive metabolic panel - Lipid Panel With LDL/HDL Ratio  ------------------------------------------------------------------------------------------------------------    Mar Daring, PA-C  [facility]

## 2018-05-01 NOTE — Telephone Encounter (Signed)
Discontinue keflex and sent in zpak. She has tolerated this in the past.

## 2018-05-01 NOTE — Telephone Encounter (Signed)
Pt states she has broke out in whelps from head to toe to Amoxicillin.  Pharmacy called the patient and told her the medication prescribed to her was in the same family and asked if she wanted to take the mediation  Pt states she wanted to check with her PCP first prior to taking mediation.

## 2018-05-01 NOTE — Telephone Encounter (Signed)
Patient advised as below.  

## 2018-05-01 NOTE — Patient Instructions (Addendum)
[name] , Thank you for taking time to come for your Medicare Wellness Visit. I appreciate your ongoing commitment to your health goals. Please review the following plan we discussed and let me know if I can assist you in the future.   Screening recommendations/referrals: Colonoscopy: N/A Mammogram: Up to date Bone Density: Up to date Recommended yearly ophthalmology/optometry visit for glaucoma screening and checkup Recommended yearly dental visit for hygiene and checkup  Vaccinations: Influenza vaccine: Up to date Pneumococcal vaccine: Up to date Tdap vaccine: Pt declines today.  Shingles vaccine: Pt declines today.     Advanced directives: Advance directive discussed with you today. Even though you declined this today please call our office should you change your mind and we can give you the proper paperwork for you to fill out.  Conditions/risks identified: Recommend increasing water intake to 4 glasses a day.   Next appointment: 1:40 PM today with Fenton Malling.   Preventive Care 80 Years and Older, Female Preventive care refers to lifestyle choices and visits with your health care provider that can promote health and wellness. What does preventive care include?  A yearly physical exam. This is also called an annual well check.  Dental exams once or twice a year.  Routine eye exams. Ask your health care provider how often you should have your eyes checked.  Personal lifestyle choices, including:  Daily care of your teeth and gums.  Regular physical activity.  Eating a healthy diet.  Avoiding tobacco and drug use.  Limiting alcohol use.  Practicing safe sex.  Taking low-dose aspirin every day.  Taking vitamin and mineral supplements as recommended by your health care provider. What happens during an annual well check? The services and screenings done by your health care provider during your annual well check will depend on your age, overall health,  lifestyle risk factors, and family history of disease. Counseling  Your health care provider may ask you questions about your:  Alcohol use.  Tobacco use.  Drug use.  Emotional well-being.  Home and relationship well-being.  Sexual activity.  Eating habits.  History of falls.  Memory and ability to understand (cognition).  Work and work Statistician.  Reproductive health. Screening  You may have the following tests or measurements:  Height, weight, and BMI.  Blood pressure.  Lipid and cholesterol levels. These may be checked every 5 years, or more frequently if you are over 21 years old.  Skin check.  Lung cancer screening. You may have this screening every year starting at age 43 if you have a 30-pack-year history of smoking and currently smoke or have quit within the past 15 years.  Fecal occult blood test (FOBT) of the stool. You may have this test every year starting at age 77.  Flexible sigmoidoscopy or colonoscopy. You may have a sigmoidoscopy every 5 years or a colonoscopy every 10 years starting at age 22.  Hepatitis C blood test.  Hepatitis B blood test.  Sexually transmitted disease (STD) testing.  Diabetes screening. This is done by checking your blood sugar (glucose) after you have not eaten for a while (fasting). You may have this done every 1-3 years.  Bone density scan. This is done to screen for osteoporosis. You may have this done starting at age 32.  Mammogram. This may be done every 1-2 years. Talk to your health care provider about how often you should have regular mammograms. Talk with your health care provider about your test results, treatment options, and if  necessary, the need for more tests. Vaccines  Your health care provider may recommend certain vaccines, such as:  Influenza vaccine. This is recommended every year.  Tetanus, diphtheria, and acellular pertussis (Tdap, Td) vaccine. You may need a Td booster every 10 years.  Zoster  vaccine. You may need this after age 24.  Pneumococcal 13-valent conjugate (PCV13) vaccine. One dose is recommended after age 27.  Pneumococcal polysaccharide (PPSV23) vaccine. One dose is recommended after age 23. Talk to your health care provider about which screenings and vaccines you need and how often you need them. This information is not intended to replace advice given to you by your health care provider. Make sure you discuss any questions you have with your health care provider. Document Released: 12/29/2015 Document Revised: 08/21/2016 Document Reviewed: 10/03/2015 Elsevier Interactive Patient Education  2017 [place] Prevention in the Home Falls can cause injuries. They can happen to people of all ages. There are many things you can do to make your home safe and to help prevent falls. What can I do on the outside of my home?  Regularly fix the edges of walkways and driveways and fix any cracks.  Remove anything that might make you trip as you walk through a door, such as a raised step or threshold.  Trim any bushes or trees on the path to your home.  Use bright outdoor lighting.  Clear any walking paths of anything that might make someone trip, such as rocks or tools.  Regularly check to see if handrails are loose or broken. Make sure that both sides of any steps have handrails.  Any raised decks and porches should have guardrails on the edges.  Have any leaves, snow, or ice cleared regularly.  Use sand or salt on walking paths during winter.  Clean up any spills in your garage right away. This includes oil or grease spills. What can I do in the bathroom?  Use night lights.  Install grab bars by the toilet and in the tub and shower. Do not use towel bars as grab bars.  Use non-skid mats or decals in the tub or shower.  If you need to sit down in the shower, use a plastic, non-slip stool.  Keep the floor dry. Clean up any water that spills on the  floor as soon as it happens.  Remove soap buildup in the tub or shower regularly.  Attach bath mats securely with double-sided non-slip rug tape.  Do not have throw rugs and other things on the floor that can make you trip. What can I do in the bedroom?  Use night lights.  Make sure that you have a light by your bed that is easy to reach.  Do not use any sheets or blankets that are too big for your bed. They should not hang down onto the floor.  Have a firm chair that has side arms. You can use this for support while you get dressed.  Do not have throw rugs and other things on the floor that can make you trip. What can I do in the kitchen?  Clean up any spills right away.  Avoid walking on wet floors.  Keep items that you use a lot in easy-to-reach places.  If you need to reach something above you, use a strong step stool that has a grab bar.  Keep electrical cords out of the way.  Do not use floor polish or wax that makes floors slippery. If you must  use wax, use non-skid floor wax.  Do not have throw rugs and other things on the floor that can make you trip. What can I do with my stairs?  Do not leave any items on the stairs.  Make sure that there are handrails on both sides of the stairs and use them. Fix handrails that are broken or loose. Make sure that handrails are as long as the stairways.  Check any carpeting to make sure that it is firmly attached to the stairs. Fix any carpet that is loose or worn.  Avoid having throw rugs at the top or bottom of the stairs. If you do have throw rugs, attach them to the floor with carpet tape.  Make sure that you have a light switch at the top of the stairs and the bottom of the stairs. If you do not have them, ask someone to add them for you. What else can I do to help prevent falls?  Wear shoes that:  Do not have high heels.  Have rubber bottoms.  Are comfortable and fit you well.  Are closed at the toe. Do not wear  sandals.  If you use a stepladder:  Make sure that it is fully opened. Do not climb a closed stepladder.  Make sure that both sides of the stepladder are locked into place.  Ask someone to hold it for you, if possible.  Clearly mark and make sure that you can see:  Any grab bars or handrails.  First and last steps.  Where the edge of each step is.  Use tools that help you move around (mobility aids) if they are needed. These include:  Canes.  Walkers.  Scooters.  Crutches.  Turn on the lights when you go into a dark area. Replace any light bulbs as soon as they burn out.  Set up your furniture so you have a clear path. Avoid moving your furniture around.  If any of your floors are uneven, fix them.  If there are any pets around you, be aware of where they are.  Review your medicines with your doctor. Some medicines can make you feel dizzy. This can increase your chance of falling. Ask your doctor what other things that you can do to help prevent falls. This information is not intended to replace advice given to you by your health care provider. Make sure you discuss any questions you have with your health care provider. Document Released: 09/28/2009 Document Revised: 05/09/2016 Document Reviewed: 01/06/2015 Elsevier Interactive Patient Education  2017 [employer].

## 2018-05-01 NOTE — Progress Notes (Signed)
Subjective:   Jackie Turner is a 82 y.o. female who presents for Medicare Annual (Subsequent) preventive examination.  Review of Systems:  N/A  Cardiac Risk Factors include: advanced age (>18men, >9 women);dyslipidemia     Objective:     Vitals: BP 138/68 (BP Location: Right Arm)   Pulse 88   Temp 99.4 F (37.4 C) (Oral)   Ht 4\' 10"  (1.473 m)   Wt 116 lb 12.8 oz (53 kg)   BMI 24.41 kg/m   Body mass index is 24.41 kg/m.  Advanced Directives 05/01/2018 06/17/2017 04/24/2017 04/16/2017 04/15/2017 04/22/2016 09/27/2015  Does Patient Have a Medical Advance Directive? No No No No No No -  Would patient like information on creating a medical advance directive? No - Patient declined No - Patient declined No - Patient declined No - Patient declined - No - patient declined information No - patient declined information    Tobacco Social History   Tobacco Use  Smoking Status Never Smoker  Smokeless Tobacco Never Used     Counseling given: Not Answered   Clinical Intake:  Pre-visit preparation completed: Yes  Pain : No/denies pain Pain Score: 0-No pain     Nutritional Status: BMI of 19-24  Normal Nutritional Risks: None Diabetes: No  How often do you need to have someone help you when you read instructions, pamphlets, or other written materials from your doctor or pharmacy?: 1 - Never  Interpreter Needed?: No  Information entered by :: [facility], LPN  Past Medical History:  Diagnosis Date  . Allergy   . Asthma   . Hyperlipidemia   . Osteoporosis    Past Surgical History:  Procedure Laterality Date  . ABDOMINAL HYSTERECTOMY  2003  . BREAST BIOPSY  1976, 1978   benign  . carpal tunnel repair Bilateral    R 1990; L 1997  . EYE SURGERY Bilateral    cataract extraction  . HAND TENDON SURGERY Right    Trigger finger  . IR RADIOLOGIST EVAL & MGMT  06/13/2017  . IR VERTEBROPLASTY CERV/THOR BX INC UNI/BIL INC/INJECT/IMAGING  06/17/2017  . TONSILLECTOMY  1961    Family History  Problem Relation Age of Onset  . Heart disease Mother   . CAD Mother   . Congestive Heart Failure Mother   . Osteoarthritis Mother   . Cancer Sister        breast  . Breast cancer Sister    Social History   Socioeconomic History  . Marital status: Widowed    Spouse name: [name]  . Number of children: 0  . Years of education: H/S  . Highest education level: 12th grade  Occupational History  . Occupation: Retired  Scientific laboratory technician  . Financial resource strain: Not hard at all  . Food insecurity:    Worry: Never true    Inability: Never true  . Transportation needs:    Medical: No    Non-medical: No  Tobacco Use  . Smoking status: Never Smoker  . Smokeless tobacco: Never Used  Substance and Sexual Activity  . Alcohol use: No  . Drug use: No  . Sexual activity: Never  Lifestyle  . Physical activity:    Days per week: Not on file    Minutes per session: Not on file  . Stress: Only a little  Relationships  . Social connections:    Talks on phone: Not on file    Gets together: Not on file    Attends religious service: Not on file  Active member of club or organization: Not on file    Attends meetings of clubs or organizations: Not on file    Relationship status: Not on file  Other Topics Concern  . Not on file  Social History Narrative  . Not on file    Outpatient Encounter Medications as of 05/01/2018  Medication Sig  . ADVAIR DISKUS 250-50 MCG/DOSE AEPB INHALE 1 PUFF INTO THE LUNGS 2 TIMES DAILY  . Calcium Carbonate-Vitamin D 600-400 MG-UNIT per tablet Take 1 tablet by mouth 2 (two) times daily.   Marland Kitchen dicyclomine (BENTYL) 10 MG capsule TAKE 1 CAPSULE BY MOUTH 3 TIMES A DAY ASNEEDED FOR SPASMS  . doxepin (SINEQUAN) 10 MG capsule Take 3 capsules (30 mg total) by mouth at bedtime.  . fluticasone (FLONASE) 50 MCG/ACT nasal spray Place 2 sprays into both nostrils daily.   . furosemide (LASIX) 20 MG tablet Take 1 tablet (20 mg total) by mouth daily as  needed for edema.  Marland Kitchen nystatin (MYCOSTATIN) 100000 UNIT/ML suspension Take 5 mLs (500,000 Units total) by mouth 4 (four) times daily. Swish and spit  . oxybutynin (DITROPAN XL) 15 MG 24 hr tablet Take 15 mg by mouth at bedtime.   . pentosan polysulfate (ELMIRON) 100 MG capsule Take 200 mg by mouth 2 (two) times daily.   Marland Kitchen ULTRAM 50 MG tablet Take 1 tablet (50 mg total) by mouth every 8 (eight) hours as needed. for pain.  . urea (CARMOL) 40 % CREA Apply topically.  . VENTOLIN HFA 108 (90 Base) MCG/ACT inhaler INHALE TWO PUFFS INTO THE LUNGS EVERY 4 HOURS AS NEEDED FOR WHEEZING OR SHORTNESS OF BREATH  . verapamil (CALAN-SR) 180 MG CR tablet TAKE ONE-HALF TABLET BY MOUTH DAILY  . potassium chloride (K-DUR) 10 MEQ tablet Take one tab PO daily when you take fluid pill (Patient not taking: Reported on 05/01/2018)   No facility-administered encounter medications on file as of 05/01/2018.     Activities of Daily Living In your present state of health, do you have any difficulty performing the following activities: 05/01/2018 06/17/2017  Hearing? N N  Vision? N N  Difficulty concentrating or making decisions? N N  Walking or climbing stairs? N Y  Dressing or bathing? N N  Doing errands, shopping? N -  Preparing Food and eating ? N -  Using the Toilet? N -  In the past six months, have you accidently leaked urine? N -  Do you have problems with loss of bowel control? N -  Managing your Medications? N -  Managing your Finances? N -  Housekeeping or managing your Housekeeping? N -  Some recent data might be hidden    Patient Care Team: Mar Daring, PA-C as PCP - General (Family Medicine) Domingo Pulse, MD as Consulting Physician (Urology) Solum, Betsey Holiday, MD as Physician Assistant (Endocrinology) Theresa Duty, MD as Consulting Physician (Radiology)    Assessment:   This is a routine wellness examination for Jackie Turner.  Exercise Activities and Dietary recommendations Current Exercise  Habits: Home exercise routine, Type of exercise: walking, Time (Minutes): 45, Frequency (Times/Week): 3, Weekly Exercise (Minutes/Week): 135, Intensity: Mild, Exercise limited by: None identified  Goals    . DIET - INCREASE WATER INTAKE     Recommend increasing water intake to 4 glasses a day.        Fall Risk Fall Risk  05/01/2018 04/24/2017 04/22/2016  Falls in the past year? No No No   Is the patient's home free  of loose throw rugs in walkways, pet beds, electrical cords, etc?   yes      Grab bars in the bathroom? yes      Handrails on the stairs?   yes      Adequate lighting?   yes  Timed Get Up and Go performed: N/A  Depression Screen PHQ 2/9 Scores 05/01/2018 05/01/2018 04/24/2017 04/24/2017  PHQ - 2 Score 0 0 0 0  PHQ- 9 Score 0 - 0 -     Cognitive Function: Pt declined screening today.      6CIT Screen 04/24/2017  What Year? 0 points  What month? 0 points  What time? 0 points  Count back from 20 0 points  Months in reverse 0 points  Repeat phrase 2 points  Total Score 2    Immunization History  Administered Date(s) Administered  . Influenza Split 09/25/2009  . Influenza, High Dose Seasonal PF 09/27/2015, 10/21/2016, 09/22/2017  . Pneumococcal Conjugate-13 02/09/2015  . Pneumococcal Polysaccharide-23 10/07/2003  . Td 05/14/2007    Qualifies for Shingles Vaccine? Due for Shingles vaccine. Declined my offer to administer today. Education has been provided regarding the importance of this vaccine. Pt has been advised to call her insurance company to determine her out of pocket expense. Advised she may also receive this vaccine at her local pharmacy or Health Dept. Verbalized acceptance and understanding.  Screening Tests Health Maintenance  Topic Date Due  . TETANUS/TDAP  05/13/2017  . INFLUENZA VACCINE  07/16/2018  . MAMMOGRAM  02/11/2019  . DEXA SCAN  Completed  . PNA vac Low Risk Adult  Completed    Cancer Screenings: Lung: Low Dose CT Chest recommended if  Age 90-80 years, 30 pack-year currently smoking OR have quit w/in 15years. Patient does not qualify. Breast:  Up to date on Mammogram? Yes   Up to date of Bone Density/Dexa? Yes Colorectal: N/A  Additional Screenings:  Hepatitis C Screening: N/A     Plan:  I have personally reviewed and addressed the Medicare Annual Wellness questionnaire and have noted the following in the patient's chart:  A. Medical and social history B. Use of alcohol, tobacco or illicit drugs  C. Current medications and supplements D. Functional ability and status E.  Nutritional status F.  Physical activity G. Advance directives H. List of other physicians I.  Hospitalizations, surgeries, and ER visits in previous 12 months J.  [place] such as hearing and vision if needed, cognitive and depression L. Referrals and appointments - none  In addition, I have reviewed and discussed with patient certain preventive protocols, quality metrics, and best practice recommendations. A written personalized care plan for preventive services as well as general preventive health recommendations were provided to patient.  See attached scanned questionnaire for additional information.   Signed,  Fabio Neighbors, LPN Nurse Health Advisor   Nurse Recommendations: Pt declined the tetanus vaccine today.

## 2018-05-04 ENCOUNTER — Encounter: Payer: Self-pay | Admitting: Physician Assistant

## 2018-05-19 DIAGNOSIS — Z8781 Personal history of (healed) traumatic fracture: Secondary | ICD-10-CM | POA: Diagnosis not present

## 2018-05-19 DIAGNOSIS — M810 Age-related osteoporosis without current pathological fracture: Secondary | ICD-10-CM | POA: Diagnosis not present

## 2018-05-21 DIAGNOSIS — Z0000 Encounter for general adult medical examination without abnormal findings: Secondary | ICD-10-CM | POA: Diagnosis not present

## 2018-05-21 DIAGNOSIS — M810 Age-related osteoporosis without current pathological fracture: Secondary | ICD-10-CM | POA: Diagnosis not present

## 2018-05-21 DIAGNOSIS — E7800 Pure hypercholesterolemia, unspecified: Secondary | ICD-10-CM | POA: Diagnosis not present

## 2018-05-22 ENCOUNTER — Telehealth: Payer: Self-pay

## 2018-05-22 LAB — CBC WITH DIFFERENTIAL/PLATELET
Basophils Absolute: 0 10*3/uL (ref 0.0–0.2)
Basos: 1 %
EOS (ABSOLUTE): 0.3 10*3/uL (ref 0.0–0.4)
Eos: 4 %
HEMOGLOBIN: 14.7 g/dL (ref 11.1–15.9)
Hematocrit: 43.8 % (ref 34.0–46.6)
Immature Grans (Abs): 0 10*3/uL (ref 0.0–0.1)
Immature Granulocytes: 0 %
LYMPHS ABS: 2.1 10*3/uL (ref 0.7–3.1)
LYMPHS: 30 %
MCH: 29.1 pg (ref 26.6–33.0)
MCHC: 33.6 g/dL (ref 31.5–35.7)
MCV: 87 fL (ref 79–97)
MONOCYTES: 9 %
Monocytes Absolute: 0.7 10*3/uL (ref 0.1–0.9)
NEUTROS ABS: 4.1 10*3/uL (ref 1.4–7.0)
Neutrophils: 56 %
PLATELETS: 299 10*3/uL (ref 150–450)
RBC: 5.05 x10E6/uL (ref 3.77–5.28)
RDW: 14.9 % (ref 12.3–15.4)
WBC: 7.3 10*3/uL (ref 3.4–10.8)

## 2018-05-22 LAB — COMPREHENSIVE METABOLIC PANEL
ALK PHOS: 74 IU/L (ref 39–117)
ALT: 18 IU/L (ref 0–32)
AST: 14 IU/L (ref 0–40)
Albumin/Globulin Ratio: 2.2 (ref 1.2–2.2)
Albumin: 4.6 g/dL (ref 3.5–4.7)
BILIRUBIN TOTAL: 0.3 mg/dL (ref 0.0–1.2)
BUN/Creatinine Ratio: 25 (ref 12–28)
BUN: 19 mg/dL (ref 8–27)
CHLORIDE: 99 mmol/L (ref 96–106)
CO2: 29 mmol/L (ref 20–29)
CREATININE: 0.75 mg/dL (ref 0.57–1.00)
Calcium: 9.8 mg/dL (ref 8.7–10.3)
GFR calc Af Amer: 87 mL/min/{1.73_m2} (ref >59)
GFR calc non Af Amer: 76 mL/min/{1.73_m2} (ref >59)
GLUCOSE: 117 mg/dL — AB (ref 65–99)
Globulin, Total: 2.1 g/dL (ref 1.5–4.5)
Potassium: 4.8 mmol/L (ref 3.5–5.2)
Sodium: 143 mmol/L (ref 134–144)
Total Protein: 6.7 g/dL (ref 6.0–8.5)

## 2018-05-22 LAB — LIPID PANEL WITH LDL/HDL RATIO
CHOLESTEROL TOTAL: 216 mg/dL — AB (ref 100–199)
HDL: 78 mg/dL (ref >39)
LDL CALC: 124 mg/dL — AB (ref 0–99)
LDl/HDL Ratio: 1.6 ratio (ref 0.0–3.2)
TRIGLYCERIDES: 70 mg/dL (ref 0–149)
VLDL CHOLESTEROL CAL: 14 mg/dL (ref 5–40)

## 2018-05-22 LAB — TSH: TSH: 4.21 u[IU]/mL (ref 0.450–4.500)

## 2018-05-22 NOTE — Telephone Encounter (Signed)
-----   Message from Mar Daring, PA-C sent at 05/22/2018 10:32 AM EDT ----- Thyroid normal. Cholesterol stable and unchanged. Blood count normal. Kidney and liver function normal. Sugar stable.

## 2018-05-22 NOTE — Telephone Encounter (Signed)
Patient advised as below.  

## 2018-05-26 ENCOUNTER — Other Ambulatory Visit: Payer: Self-pay | Admitting: Physician Assistant

## 2018-05-26 DIAGNOSIS — S22000D Wedge compression fracture of unspecified thoracic vertebra, subsequent encounter for fracture with routine healing: Secondary | ICD-10-CM

## 2018-05-26 NOTE — Telephone Encounter (Signed)
Pt needs refill on her ultram [place]  Pt's call back [phone]  Thanks Con Memos

## 2018-05-27 ENCOUNTER — Ambulatory Visit
Admission: RE | Admit: 2018-05-27 | Discharge: 2018-05-27 | Disposition: A | Discharge status: Home / Self Care | Payer: Medicare HMO | Source: Ambulatory Visit | Attending: Physician Assistant | Admitting: Physician Assistant

## 2018-05-27 ENCOUNTER — Telehealth: Payer: Self-pay

## 2018-05-27 ENCOUNTER — Other Ambulatory Visit: Payer: Medicare HMO

## 2018-05-27 DIAGNOSIS — M8000XG Age-related osteoporosis with current pathological fracture, unspecified site, subsequent encounter for fracture with delayed healing: Secondary | ICD-10-CM | POA: Insufficient documentation

## 2018-05-27 DIAGNOSIS — M85851 Other specified disorders of bone density and structure, right thigh: Secondary | ICD-10-CM | POA: Diagnosis not present

## 2018-05-27 DIAGNOSIS — M85852 Other specified disorders of bone density and structure, left thigh: Secondary | ICD-10-CM | POA: Insufficient documentation

## 2018-05-27 DIAGNOSIS — M810 Age-related osteoporosis without current pathological fracture: Secondary | ICD-10-CM | POA: Diagnosis not present

## 2018-05-27 DIAGNOSIS — E875 Hyperkalemia: Secondary | ICD-10-CM

## 2018-05-27 DIAGNOSIS — Z780 Asymptomatic menopausal state: Secondary | ICD-10-CM | POA: Diagnosis not present

## 2018-05-27 MED ORDER — ULTRAM 50 MG PO TABS: 50.0000 mg | ORAL_TABLET | Freq: Three times a day (TID) | ORAL | 5 refills | Status: DC | PRN

## 2018-05-27 NOTE — Telephone Encounter (Signed)
LMTCB

## 2018-05-27 NOTE — Telephone Encounter (Signed)
-----   Message from Mar Daring, PA-C sent at 05/27/2018  3:18 PM EDT ----- BMD still with osteoporosis but slight improvement from last BMD.

## 2018-05-28 NOTE — Telephone Encounter (Signed)
LM that lab requisition form is placed up front ready for pick up.  Thanks,  -Lynnie Koehler

## 2018-05-28 NOTE — Telephone Encounter (Signed)
Patient advised of this message.  While on the phone she reported that the provider she saw last week at [facility] also did labs and said she needed to have her potassium rechecked because it was too high.  It is in [place] and it was 5.6.  She said she had been drinking a lot of OJ recently and thinks this may have contributed.  She has since stopped drinking it. When would you like her to repeat this?  Thanks

## 2018-05-28 NOTE — Telephone Encounter (Signed)
Lets repeat next week.

## 2018-06-19 DIAGNOSIS — E875 Hyperkalemia: Secondary | ICD-10-CM | POA: Diagnosis not present

## 2018-06-20 LAB — POTASSIUM: Potassium: 5.2 mmol/L (ref 3.5–5.2)

## 2018-06-22 ENCOUNTER — Telehealth: Payer: Self-pay

## 2018-06-22 NOTE — Telephone Encounter (Signed)
-----   Message from Mar Daring, PA-C sent at 06/22/2018  9:22 AM EDT ----- Potassium normal.

## 2018-06-22 NOTE — Telephone Encounter (Signed)
Only ringing but no answer.  Thanks.  Kasandra Knudsen

## 2018-06-23 NOTE — Telephone Encounter (Signed)
Patient advised as below.  

## 2018-07-01 DIAGNOSIS — M810 Age-related osteoporosis without current pathological fracture: Secondary | ICD-10-CM | POA: Diagnosis not present

## 2018-07-06 DIAGNOSIS — L905 Scar conditions and fibrosis of skin: Secondary | ICD-10-CM | POA: Diagnosis not present

## 2018-07-17 DIAGNOSIS — M48061 Spinal stenosis, lumbar region without neurogenic claudication: Secondary | ICD-10-CM | POA: Diagnosis not present

## 2018-07-20 ENCOUNTER — Other Ambulatory Visit: Payer: Self-pay | Admitting: Physician Assistant

## 2018-07-20 DIAGNOSIS — B370 Candidal stomatitis: Secondary | ICD-10-CM

## 2018-07-20 MED ORDER — NYSTATIN 100000 UNIT/ML MT SUSP: 5.0000 mL | Freq: Four times a day (QID) | OROMUCOSAL | 0 refills | Status: DC

## 2018-07-20 NOTE — Telephone Encounter (Signed)
Pt needs a refill on her nystatin mouth wash  She has a flair up in her mouth from her injection in her mouth  She uses [facility]  CB# [phone]  Thanks Con Memos

## 2018-07-28 ENCOUNTER — Other Ambulatory Visit: Payer: Self-pay

## 2018-07-28 DIAGNOSIS — B370 Candidal stomatitis: Secondary | ICD-10-CM

## 2018-07-28 NOTE — Telephone Encounter (Signed)
Patient requesting a refill of nystatin, patient reports she still has white patches on her mouth and throat.

## 2018-07-29 MED ORDER — NYSTATIN 100000 UNIT/ML MT SUSP: 5.0000 mL | Freq: Four times a day (QID) | OROMUCOSAL | 0 refills | Status: DC

## 2018-08-10 ENCOUNTER — Other Ambulatory Visit: Payer: Self-pay | Admitting: Physician Assistant

## 2018-08-10 DIAGNOSIS — F5101 Primary insomnia: Secondary | ICD-10-CM

## 2018-08-10 DIAGNOSIS — K580 Irritable bowel syndrome with diarrhea: Secondary | ICD-10-CM

## 2018-08-21 ENCOUNTER — Other Ambulatory Visit: Payer: Self-pay | Admitting: Physician Assistant

## 2018-08-21 DIAGNOSIS — J301 Allergic rhinitis due to pollen: Secondary | ICD-10-CM

## 2018-09-07 ENCOUNTER — Other Ambulatory Visit: Payer: Self-pay | Admitting: Physician Assistant

## 2018-09-07 DIAGNOSIS — J4540 Moderate persistent asthma, uncomplicated: Secondary | ICD-10-CM

## 2018-09-15 ENCOUNTER — Other Ambulatory Visit: Payer: Self-pay | Admitting: Physician Assistant

## 2018-09-15 DIAGNOSIS — R600 Localized edema: Secondary | ICD-10-CM

## 2018-09-25 ENCOUNTER — Ambulatory Visit: Payer: Medicare HMO | Admitting: Physician Assistant

## 2018-09-25 ENCOUNTER — Encounter: Payer: Self-pay | Admitting: Physician Assistant

## 2018-09-25 VITALS — BP 140/78 | HR 66 | Temp 98.6°F | Resp 16 | Wt 116.0 lb

## 2018-09-25 DIAGNOSIS — Z8781 Personal history of (healed) traumatic fracture: Secondary | ICD-10-CM

## 2018-09-25 DIAGNOSIS — M5416 Radiculopathy, lumbar region: Secondary | ICD-10-CM | POA: Diagnosis not present

## 2018-09-25 DIAGNOSIS — Z23 Encounter for immunization: Secondary | ICD-10-CM

## 2018-09-25 NOTE — Progress Notes (Signed)
Patient: Jackie Turner Female    DOB: 1936-01-09   82 y.o.   MRN: [id number] Visit Date: 09/25/2018  Today's Provider: Mar Daring, PA-C   Chief Complaint  Patient presents with  . Back Pain   Subjective:    HPI Patient here with c/o back pain,this is a chronic problem. Patient also thinks that it came from the Prolia injection. She reports that the last one she received was in July 2019. She noticed her symptoms worsening after the most recent Prolia injection. She reports that she has an Ortho appointment next week with Dr. Durward Fortes. Tramadol is helping the pain currently.      Allergies  Allergen Reactions  . Nsaids Other (See Comments)    History of gastric ulcer  . Amoxicillin Nausea Only and Rash  . Ibandronic Acid Other (See Comments)    Muscle weakness - advised not to take  **BONIVA** - drug name  . Actonel  [Risedronate] Other (See Comments)    Gastric ulcers  . Antihistamines, Chlorpheniramine-Type   . Aspirin     Gastric ulcer  . Buspar [Buspirone] Other (See Comments)    Pt does not remember reaction   . Butalbital-Aspirin-Caffeine Other (See Comments)    Other reaction(s): Unknown  . Clarithromycin Other (See Comments)    Bloating  *BIAXIN*  . Codeine   . Gatifloxacin Other (See Comments)  . Hydrocodone-Guaifenesin Nausea Only    CODICLEAR DH STYRUP   . Moxifloxacin   . Oxycodone Other (See Comments)    Dizziness  . Penicillins Other (See Comments)  . Risedronate Sodium     Gastric ulcer  . Tylenol With Codeine #3  [Acetaminophen-Codeine]     Other reaction(s): Unknown  . Raloxifene Other (See Comments)    Bloating Bloating  *EVISTA*     Current Outpatient Medications:  .  ADVAIR DISKUS 250-50 MCG/DOSE AEPB, INHALE 1 PUFF INTO THE LUNGS 2 TIMES DAILY, Disp: 60 each, Rfl: 5 .  azithromycin (ZITHROMAX) 250 MG tablet, 2 tabs PO day 1; then 1 tab daily, Disp: 6 tablet, Rfl: 0 .  Calcium Carbonate-Vitamin D 600-400  MG-UNIT per tablet, Take 1 tablet by mouth 2 (two) times daily. , Disp: , Rfl:  .  dicyclomine (BENTYL) 10 MG capsule, TAKE 1 CAPSULE BY MOUTH 3 TIMES A DAY ASNEEDED FOR SPASMS, Disp: 120 capsule, Rfl: 5 .  doxepin (SINEQUAN) 10 MG capsule, TAKE 3 CAPSULES BY MOUTH AT BEDTIME, Disp: 270 capsule, Rfl: 1 .  fluticasone (FLONASE) 50 MCG/ACT nasal spray, USE TWO SPRAYS IN EACH NOSTRIL ONCE A DAY, Disp: 16 g, Rfl: 5 .  furosemide (LASIX) 20 MG tablet, TAKE 1 TABLET BY MOUTH DAILY AS NEEDED FOR EDEMA, Disp: 90 tablet, Rfl: 3 .  oxybutynin (DITROPAN XL) 15 MG 24 hr tablet, Take 15 mg by mouth at bedtime. , Disp: , Rfl:  .  pentosan polysulfate (ELMIRON) 100 MG capsule, Take 200 mg by mouth 2 (two) times daily. , Disp: , Rfl:  .  ULTRAM 50 MG tablet, Take 1 tablet (50 mg total) by mouth every 8 (eight) hours as needed. for pain., Disp: 90 tablet, Rfl: 5 .  urea (CARMOL) 40 % CREA, Apply topically., Disp: , Rfl:  .  VENTOLIN HFA 108 (90 Base) MCG/ACT inhaler, INHALE TWO PUFFS INTO THE LUNGS EVERY 4 HOURS AS NEEDED FOR WHEEZING OR SHORTNESS OF BREATH, Disp: 18 g, Rfl: 5 .  verapamil (CALAN-SR) 180 MG CR tablet, TAKE ONE-HALF TABLET BY  MOUTH DAILY, Disp: 45 tablet, Rfl: 1 .  nystatin (MYCOSTATIN) 100000 UNIT/ML suspension, Take 5 mLs (500,000 Units total) by mouth 4 (four) times daily. Swish and spit (Patient not taking: Reported on 09/25/2018), Disp: 60 mL, Rfl: 0  Review of Systems  Constitutional: Negative.   Respiratory: Negative.   Cardiovascular: Negative.   Gastrointestinal: Negative.   Musculoskeletal: Positive for back pain.  Neurological: Negative.     Social History   Tobacco Use  . Smoking status: Never Smoker  . Smokeless tobacco: Never Used  Substance Use Topics  . Alcohol use: No   Objective:   BP 140/78 (BP Location: Left Arm, Patient Position: Sitting, Cuff Size: Normal)   Pulse 66   Temp 98.6 F (37 C) (Oral)   Resp 16   Wt 116 lb (52.6 kg)   BMI 24.24 kg/m  Vitals:    09/25/18 1459  BP: 140/78  Pulse: 66  Resp: 16  Temp: 98.6 F (37 C)  TempSrc: Oral  Weight: 116 lb (52.6 kg)     Physical Exam  Constitutional: She appears well-developed and well-nourished. No distress.  HENT:  Head: Normocephalic and atraumatic.  Right Ear: Hearing, tympanic membrane, external ear and ear canal normal.  Left Ear: Hearing, tympanic membrane, external ear and ear canal normal.  Nose: Nose normal.  Mouth/Throat: Uvula is midline, oropharynx is clear and moist and mucous membranes are normal. No oropharyngeal exudate.  Eyes: Pupils are equal, round, and reactive to light. Conjunctivae are normal. Right eye exhibits no discharge. Left eye exhibits no discharge. No scleral icterus.  Neck: Normal range of motion. Neck supple. No tracheal deviation present. No thyromegaly present.  Cardiovascular: Normal rate, regular rhythm and normal heart sounds. Exam reveals no gallop and no friction rub.  No murmur heard. Pulmonary/Chest: Effort normal and breath sounds normal. No stridor. No respiratory distress. She has no wheezes. She has no rales.  Musculoskeletal:       Thoracic back: She exhibits decreased range of motion, tenderness and bony tenderness.       Lumbar back: She exhibits decreased range of motion and tenderness.  Lymphadenopathy:    She has no cervical adenopathy.  Skin: Skin is warm and dry. She is not diaphoretic.  Vitals reviewed.      Assessment & Plan:     1. Lumbar radiculopathy Worsened recently but slightly improving. Using tramadol for pain. Sees Dr. Durward Fortes next week.  2. History of compression fracture of vertebral column See above medical treatment plan.  3. Need for influenza vaccination Flu vaccine given today without complication. Patient sat upright for 15 minutes to check for adverse reaction before being released. - Flu vaccine HIGH DOSE PF       Mar Daring, PA-C  [facility]

## 2018-10-01 ENCOUNTER — Ambulatory Visit (INDEPENDENT_AMBULATORY_CARE_PROVIDER_SITE_OTHER): Payer: Medicare HMO

## 2018-10-01 ENCOUNTER — Ambulatory Visit (INDEPENDENT_AMBULATORY_CARE_PROVIDER_SITE_OTHER): Payer: Medicare HMO | Admitting: Orthopaedic Surgery

## 2018-10-01 ENCOUNTER — Encounter (INDEPENDENT_AMBULATORY_CARE_PROVIDER_SITE_OTHER): Payer: Self-pay | Admitting: Orthopaedic Surgery

## 2018-10-01 VITALS — BP 107/59 | HR 67 | Ht <= 58 in | Wt 117.0 lb

## 2018-10-01 DIAGNOSIS — G8929 Other chronic pain: Secondary | ICD-10-CM

## 2018-10-01 DIAGNOSIS — M25561 Pain in right knee: Secondary | ICD-10-CM

## 2018-10-01 MED ORDER — LIDOCAINE HCL 1 % IJ SOLN
2.0000 mL | INTRAMUSCULAR | Status: AC | PRN
  Administered 2018-10-01: 2 mL

## 2018-10-01 MED ORDER — BUPIVACAINE HCL 0.5 % IJ SOLN
2.0000 mL | INTRAMUSCULAR | Status: AC | PRN
  Administered 2018-10-01: 2 mL via INTRA_ARTICULAR

## 2018-10-01 MED ORDER — METHYLPREDNISOLONE ACETATE 40 MG/ML IJ SUSP
80.0000 mg | INTRAMUSCULAR | Status: AC | PRN
  Administered 2018-10-01: 80 mg

## 2018-10-01 NOTE — Progress Notes (Signed)
Office Visit Note   Patient: Jackie Turner           Date of Birth: March 19, 1936           MRN: [id number] Visit Date: 10/01/2018              Requested by: Mar Daring, PA-C Jackie Turner, Jackie Turner PCP: Mar Daring, PA-C   Assessment & Plan: Visit Diagnoses:  1. Chronic pain of right knee     Plan: Films demonstrate moderate osteoarthritis right knee.  Long discussion regarding diagnosis and treatment options.  Will inject the knee with cortisone and monitor response.  Continue with a pullover knee support .will have the pharmacist can answer questions regarding the Prolia that she is taking through her primary care physician's office and about which she had many questions Follow-Up Instructions: No follow-ups on file.   Orders:  Orders Placed This Encounter  Procedures  . XR KNEE 3 VIEW RIGHT   No orders of the defined types were placed in this encounter.     Procedures: Large Joint Inj: R knee on 10/01/2018 2:47 PM Indications: pain and diagnostic evaluation Details: 25 G 1.5 in needle, anteromedial approach  Arthrogram: No  Medications: 2 mL lidocaine 1 %; 2 mL bupivacaine 0.5 %; 80 mg methylPREDNISolone acetate 40 MG/ML Procedure, treatment alternatives, risks and benefits explained, specific risks discussed. Consent was given by the patient. Immediately prior to procedure a time out was called to verify the correct patient, procedure, equipment, support staff and site/side marked as required. Patient was prepped and draped in the usual sterile fashion.       Clinical Data: No additional findings.   Subjective: Chief Complaint  Patient presents with  . Follow-up    R KNEE FLARE UP, FOR ABOUT 2 WEEKS HARD TO WALK, NOTICED AFTER PROLIA INJECTION IN 12/2015 GOT 2ND INJECTION 06/2018, HAS HAD SEVERAL T SPINE FX  Jackie Turner relates a chronic problem with her back as previously outlined.  She is had a prior  vertebroplasty by Jackie Turner with some relief but does have recurrent episodes of pain vaguely throughout her back.  She has some concerns that some of her problems may be related to Prolia.  Recently she is been experiencing some pain in her right knee.  She is had some trouble over time but never had a "evaluation.  She has pain along the medial lateral joint with some popping and clicking.  He occasionally has some effusion.  She does wear a pullover knee support that "helps.  She has tried over-the-counter medicine  HPI  Review of Systems  Constitutional: Negative for fatigue and fever.  HENT: Negative for ear pain.   Eyes: Negative for pain.  Respiratory: Negative for cough and shortness of breath.   Cardiovascular: Positive for leg swelling.  Gastrointestinal: Positive for constipation. Negative for diarrhea.  Genitourinary: Negative for difficulty urinating.  Musculoskeletal: Positive for back pain. Negative for neck pain.  Skin: Negative for rash.  Allergic/Immunologic: Negative for food allergies.  Neurological: Positive for weakness and numbness.  Hematological: Bruises/bleeds easily.  Psychiatric/Behavioral: Positive for sleep disturbance.     Objective: Vital Signs: BP (!) 107/59 (BP Location: Left Arm, Patient Position: Sitting, Cuff Size: Normal)   Pulse 67   Ht 4\' 10"  (1.473 m)   Wt 117 lb (53.1 kg)   BMI 24.45 kg/m   Physical Exam  Constitutional: She is oriented to person, place, and time. She appears well-developed  and well-nourished.  HENT:  Mouth/Throat: Oropharynx is clear and moist.  Eyes: Pupils are equal, round, and reactive to light. EOM are normal.  Pulmonary/Chest: Effort normal.  Neurological: She is alert and oriented to person, place, and time.  Skin: Skin is warm and dry.  Psychiatric: She has a normal mood and affect. Her behavior is normal.    Ortho Exam  Specialty Comments:  No specialty comments available.  Imaging: No results  found.   PMFS History: Patient Active Problem List   Diagnosis Date Noted  . Swelling of limb 02/10/2018  . Varicose veins of leg with swelling, bilateral 02/10/2018  . Squamous cell carcinoma 09/22/2017  . Hypoxia 09/27/2015  . Abnormal CXR 09/27/2015  . Asthma with allergic rhinitis 08/31/2015  . Back ache 08/31/2015  . Chronic interstitial cystitis 08/31/2015  . Grief reaction 08/31/2015  . Hyperlipidemia 06/26/2015  . Migraine 06/26/2015  . Asthma 06/26/2015  . IBS (irritable bowel syndrome) 06/26/2015  . Osteoporosis 06/26/2015  . Fatigue 06/26/2015  . Lumbar radiculopathy 04/27/2013  . Urinary urgency 01/08/2012  . Frequency of urination 01/08/2012   Past Medical History:  Diagnosis Date  . Allergy   . Asthma   . Hyperlipidemia   . Osteoporosis     Family History  Problem Relation Age of Onset  . Heart disease Mother   . CAD Mother   . Congestive Heart Failure Mother   . Osteoarthritis Mother   . Cancer Sister        breast  . Breast cancer Sister     Past Surgical History:  Procedure Laterality Date  . ABDOMINAL HYSTERECTOMY  2003  . BREAST BIOPSY  1976, 1978   benign  . carpal tunnel repair Bilateral    R 1990; L 1997  . EYE SURGERY Bilateral    cataract extraction  . HAND TENDON SURGERY Right    Trigger finger  . IR RADIOLOGIST EVAL & MGMT  06/13/2017  . IR VERTEBROPLASTY CERV/THOR BX INC UNI/BIL INC/INJECT/IMAGING  06/17/2017  . TONSILLECTOMY  1961   Social History   Occupational History  . Occupation: Retired  Tobacco Use  . Smoking status: Never Smoker  . Smokeless tobacco: Never Used  Substance and Sexual Activity  . Alcohol use: No  . Drug use: No  . Sexual activity: Never

## 2018-10-07 ENCOUNTER — Telehealth (INDEPENDENT_AMBULATORY_CARE_PROVIDER_SITE_OTHER): Payer: Self-pay | Admitting: Orthopaedic Surgery

## 2018-10-07 NOTE — Telephone Encounter (Signed)
Please advise 

## 2018-10-07 NOTE — Telephone Encounter (Signed)
Patient left a voicemail stating she had a cortisone injection at her appointment on 10/17 and states her knee is not feeling any better.  Patient is requesting a return call.

## 2018-10-08 ENCOUNTER — Other Ambulatory Visit: Payer: Self-pay | Admitting: Radiology

## 2018-10-08 DIAGNOSIS — G8929 Other chronic pain: Secondary | ICD-10-CM

## 2018-10-08 DIAGNOSIS — M25561 Pain in right knee: Principal | ICD-10-CM

## 2018-10-08 NOTE — Telephone Encounter (Signed)
NOTIFIED PT

## 2018-10-08 NOTE — Telephone Encounter (Signed)
SENT IN MRI ORDER.

## 2018-10-14 ENCOUNTER — Telehealth (INDEPENDENT_AMBULATORY_CARE_PROVIDER_SITE_OTHER): Payer: Self-pay | Admitting: Orthopaedic Surgery

## 2018-10-14 NOTE — Telephone Encounter (Signed)
PLEASE ADVISE.

## 2018-10-14 NOTE — Telephone Encounter (Signed)
Patient called stating the injection she had in her right knee has not helped.  Patient states she is scheduled for an MRI on 11/15 which is the earliest they had.  Patient states the pain has increased and has been taking Ultram which has not helped at all.  Patient states she has "an old prescription of Vicodin" but doesn't want to take it until she talks to Dr. Durward Fortes or Aaron Edelman first.

## 2018-10-14 NOTE — Telephone Encounter (Signed)
Ok to take vicodin

## 2018-10-15 NOTE — Telephone Encounter (Signed)
NOTIFIED PT OK TO TAKE VICODIN

## 2018-10-19 ENCOUNTER — Ambulatory Visit
Admission: RE | Admit: 2018-10-19 | Discharge: 2018-10-19 | Disposition: A | Discharge status: Home / Self Care | Payer: Medicare HMO | Source: Ambulatory Visit | Attending: Orthopaedic Surgery | Admitting: Orthopaedic Surgery

## 2018-10-19 DIAGNOSIS — M7121 Synovial cyst of popliteal space [Baker], right knee: Secondary | ICD-10-CM | POA: Insufficient documentation

## 2018-10-19 DIAGNOSIS — M1711 Unilateral primary osteoarthritis, right knee: Secondary | ICD-10-CM | POA: Diagnosis not present

## 2018-10-19 DIAGNOSIS — M25461 Effusion, right knee: Secondary | ICD-10-CM | POA: Insufficient documentation

## 2018-10-19 DIAGNOSIS — G8929 Other chronic pain: Secondary | ICD-10-CM

## 2018-10-19 DIAGNOSIS — X58XXXA Exposure to other specified factors, initial encounter: Secondary | ICD-10-CM | POA: Diagnosis not present

## 2018-10-19 DIAGNOSIS — S83241A Other tear of medial meniscus, current injury, right knee, initial encounter: Secondary | ICD-10-CM | POA: Diagnosis not present

## 2018-10-19 DIAGNOSIS — M25561 Pain in right knee: Secondary | ICD-10-CM | POA: Diagnosis not present

## 2018-10-19 DIAGNOSIS — M23321 Other meniscus derangements, posterior horn of medial meniscus, right knee: Secondary | ICD-10-CM | POA: Diagnosis not present

## 2018-10-22 ENCOUNTER — Other Ambulatory Visit: Payer: Self-pay

## 2018-10-22 DIAGNOSIS — E876 Hypokalemia: Secondary | ICD-10-CM

## 2018-10-22 DIAGNOSIS — I10 Essential (primary) hypertension: Secondary | ICD-10-CM

## 2018-10-22 MED ORDER — VERAPAMIL HCL ER 180 MG PO TBCR: 90.0000 mg | EXTENDED_RELEASE_TABLET | Freq: Every day | ORAL | 1 refills | Status: DC

## 2018-10-22 MED ORDER — POTASSIUM CHLORIDE ER 10 MEQ PO TBCR: 10.0000 meq | EXTENDED_RELEASE_TABLET | Freq: Every day | ORAL | 3 refills | Status: DC

## 2018-10-30 ENCOUNTER — Other Ambulatory Visit: Payer: Medicare HMO

## 2018-11-03 ENCOUNTER — Other Ambulatory Visit (INDEPENDENT_AMBULATORY_CARE_PROVIDER_SITE_OTHER): Payer: Self-pay | Admitting: Radiology

## 2018-11-03 ENCOUNTER — Ambulatory Visit (INDEPENDENT_AMBULATORY_CARE_PROVIDER_SITE_OTHER): Payer: Medicare HMO | Admitting: Orthopaedic Surgery

## 2018-11-03 ENCOUNTER — Encounter (INDEPENDENT_AMBULATORY_CARE_PROVIDER_SITE_OTHER): Payer: Self-pay | Admitting: Orthopaedic Surgery

## 2018-11-03 VITALS — Ht <= 58 in | Wt 117.0 lb

## 2018-11-03 DIAGNOSIS — G8929 Other chronic pain: Secondary | ICD-10-CM | POA: Diagnosis not present

## 2018-11-03 DIAGNOSIS — M25561 Pain in right knee: Secondary | ICD-10-CM

## 2018-11-03 MED ORDER — DICLOFENAC SODIUM 1 % TD GEL: TRANSDERMAL | 5 refills | Status: DC

## 2018-11-03 NOTE — Progress Notes (Signed)
Office Visit Note   Patient: Jackie Turner           Date of Birth: 1936-09-21           MRN: [id number] Visit Date: 11/03/2018              Requested by: Mar Daring, PA-C [address], [place] [address] PCP: Mar Daring, PA-C   Assessment & Plan: Visit Diagnoses:  1. Chronic pain of right knee     Plan: MRI scan demonstrates a combination of arthritis in all 3 compartments and anterior of the posterior horn of the medial meniscus at the meniscal root.  I think based on her symptoms and exam that her problem is predominantly related to the arthritis.  She has good days and bad days.  She has tried over-the-counter medicines and wears his knee support.  I had a long discussion regarding the findings on the MRI scan.  I will suggest exercises and the Voltaren gel will plan to see her back in the next 4 to 6 weeks or sooner on her symptoms.  I am not sure arthroscopic evaluation is really going to be much benefit the meniscal tear does not appear to be symptomatic  Follow-Up Instructions: Return if symptoms worsen or fail to improve.   Orders:  No orders of the defined types were placed in this encounter.  No orders of the defined types were placed in this encounter.     Procedures: No procedures performed   Clinical Data: No additional findings.   Subjective: Chief Complaint  Patient presents with  . Follow-up    MRI REVIEW R KNEE  Jackie Turner does have recurrent pain in her right knee medially and laterally and even in the patellar region.  Because of her poor response to time and intra-articular cortisone I ordered an MRI scan.  Results are as above  HPI  Review of Systems  Constitutional: Negative for fatigue and fever.  HENT: Negative for ear pain.   Eyes: Negative for pain.  Respiratory: Negative for cough and shortness of breath.   Cardiovascular: Negative for leg swelling.  Gastrointestinal: Negative for  constipation and diarrhea.  Genitourinary: Negative for difficulty urinating.  Musculoskeletal: Negative for back pain and neck pain.  Skin: Negative for rash.  Allergic/Immunologic: Negative for food allergies.  Neurological: Positive for weakness. Negative for numbness.  Hematological: Bruises/bleeds easily.  Psychiatric/Behavioral: Negative for sleep disturbance.     Objective: Vital Signs: Ht 4\' 10"  (1.473 m)   Wt 117 lb (53.1 kg)   BMI 24.45 kg/m   Physical Exam  Constitutional: She is oriented to person, place, and time. She appears well-developed and well-nourished.  HENT:  Mouth/Throat: Oropharynx is clear and moist.  Eyes: Pupils are equal, round, and reactive to light. EOM are normal.  Pulmonary/Chest: Effort normal.  Neurological: She is alert and oriented to person, place, and time.  Skin: Skin is warm and dry.  Psychiatric: She has a normal mood and affect. Her behavior is normal.    Ortho Exam right knee without effusion.  No tenderness to the leg along the medial or lateral joint line.  No patella pain.  Has a pullover knee support but walks without a limp.  No distal edema.  Full extension and flexion comparable to the opposite knee.  Specialty Comments:  No specialty comments available.  Imaging: No results found.   PMFS History: Patient Active Problem List   Diagnosis Date Noted  .  Swelling of limb 02/10/2018  . Varicose veins of leg with swelling, bilateral 02/10/2018  . Squamous cell carcinoma 09/22/2017  . Hypoxia 09/27/2015  . Abnormal CXR 09/27/2015  . Asthma with allergic rhinitis 08/31/2015  . Back ache 08/31/2015  . Chronic interstitial cystitis 08/31/2015  . Grief reaction 08/31/2015  . Hyperlipidemia 06/26/2015  . Migraine 06/26/2015  . Asthma 06/26/2015  . IBS (irritable bowel syndrome) 06/26/2015  . Osteoporosis 06/26/2015  . Fatigue 06/26/2015  . Lumbar radiculopathy 04/27/2013  . Urinary urgency 01/08/2012  . Frequency of  urination 01/08/2012   Past Medical History:  Diagnosis Date  . Allergy   . Asthma   . Hyperlipidemia   . Osteoporosis     Family History  Problem Relation Age of Onset  . Heart disease Mother   . CAD Mother   . Congestive Heart Failure Mother   . Osteoarthritis Mother   . Cancer Sister        breast  . Breast cancer Sister     Past Surgical History:  Procedure Laterality Date  . ABDOMINAL HYSTERECTOMY  2003  . BREAST BIOPSY  1976, 1978   benign  . carpal tunnel repair Bilateral    R 1990; L 1997  . EYE SURGERY Bilateral    cataract extraction  . HAND TENDON SURGERY Right    Trigger finger  . IR RADIOLOGIST EVAL & MGMT  06/13/2017  . IR VERTEBROPLASTY CERV/THOR BX INC UNI/BIL INC/INJECT/IMAGING  06/17/2017  . TONSILLECTOMY  1961   Social History   Occupational History  . Occupation: Retired  Tobacco Use  . Smoking status: Never Smoker  . Smokeless tobacco: Never Used  Substance and Sexual Activity  . Alcohol use: No  . Drug use: No  . Sexual activity: Never

## 2018-11-16 ENCOUNTER — Telehealth (INDEPENDENT_AMBULATORY_CARE_PROVIDER_SITE_OTHER): Payer: Self-pay | Admitting: Orthopaedic Surgery

## 2018-11-16 NOTE — Telephone Encounter (Signed)
Patient calling to let you know Voltaren Gel has not helped rt knee pain. Patient has tried ice, heat, Tens unit, staying off of it, and wearing brace. Nothing helps with the pain, and brace makes it swell. Please call patient to advise what to do.

## 2018-11-16 NOTE — Telephone Encounter (Signed)
Called- await ESI at [facility] and if no improvement with right knee pain consider knee arthroscopy

## 2018-11-16 NOTE — Telephone Encounter (Signed)
Please advise 

## 2018-11-19 DIAGNOSIS — M48061 Spinal stenosis, lumbar region without neurogenic claudication: Secondary | ICD-10-CM | POA: Diagnosis not present

## 2018-11-20 ENCOUNTER — Telehealth: Payer: Self-pay | Admitting: Physician Assistant

## 2018-11-20 DIAGNOSIS — B370 Candidal stomatitis: Secondary | ICD-10-CM

## 2018-11-20 MED ORDER — NYSTATIN 100000 UNIT/ML MT SUSP: 5.0000 mL | Freq: Four times a day (QID) | OROMUCOSAL | 0 refills | Status: DC

## 2018-11-20 NOTE — Telephone Encounter (Signed)
Please review. KW 

## 2018-11-20 NOTE — Telephone Encounter (Signed)
Pt had back injection and mouth is now getting raw and sore. Pt is asking if Tawanna Sat can get her the refill on:  nystatin (MYCOSTATIN) 100000 UNIT/ML suspension  Please fill at:  [place], [place] - [place] [phone] (Phone) [phone] (Fax)   Thanks, [place]

## 2018-11-20 NOTE — Telephone Encounter (Signed)
refilled 

## 2018-11-26 ENCOUNTER — Telehealth: Payer: Self-pay | Admitting: Physician Assistant

## 2018-11-26 DIAGNOSIS — K1379 Other lesions of oral mucosa: Secondary | ICD-10-CM

## 2018-11-26 NOTE — Telephone Encounter (Signed)
Pt is asking if more medication to be prescribed to her for her mouth.  She is still having a raw mouth from the injection.  Please advise.  Thanks, [employer]

## 2018-11-27 MED ORDER — FIRST-DUKES MOUTHWASH MT SUSP: 5.0000 mL | Freq: Three times a day (TID) | OROMUCOSAL | 0 refills | Status: DC | PRN

## 2018-11-27 NOTE — Telephone Encounter (Signed)
Sent in [place] [employer] mouthwash

## 2018-11-30 ENCOUNTER — Telehealth: Payer: Self-pay | Admitting: Physician Assistant

## 2018-11-30 NOTE — Telephone Encounter (Signed)
Needs evaluation

## 2018-11-30 NOTE — Telephone Encounter (Signed)
Pt's mouth is worse.  The mouthwash is making pt's mouth and throat worse - sore, raw and with white foamy saliva.  Please advise asap please.  Thanks, [employer]

## 2018-11-30 NOTE — Telephone Encounter (Signed)
Patient will call for appt.

## 2018-12-02 ENCOUNTER — Ambulatory Visit: Payer: Medicare HMO | Admitting: Physician Assistant

## 2018-12-02 ENCOUNTER — Encounter: Payer: Self-pay | Admitting: Physician Assistant

## 2018-12-02 VITALS — BP 143/85 | HR 64 | Temp 98.5°F | Resp 16 | Wt 116.0 lb

## 2018-12-02 DIAGNOSIS — B370 Candidal stomatitis: Secondary | ICD-10-CM

## 2018-12-02 DIAGNOSIS — K580 Irritable bowel syndrome with diarrhea: Secondary | ICD-10-CM | POA: Diagnosis not present

## 2018-12-02 DIAGNOSIS — F5101 Primary insomnia: Secondary | ICD-10-CM | POA: Diagnosis not present

## 2018-12-02 DIAGNOSIS — R69 Illness, unspecified: Secondary | ICD-10-CM | POA: Diagnosis not present

## 2018-12-02 MED ORDER — FLUCONAZOLE 150 MG PO TABS: 150.0000 mg | ORAL_TABLET | Freq: Every day | ORAL | 0 refills | Status: DC

## 2018-12-02 MED ORDER — DOXEPIN HCL 10 MG PO CAPS: ORAL_CAPSULE | ORAL | 1 refills | Status: DC

## 2018-12-02 NOTE — Progress Notes (Signed)
Patient: Jackie Turner Female    DOB: May 17, 1936   82 y.o.   MRN: [id number] Visit Date: 12/02/2018  Today's Provider: Mar Daring, PA-C   Chief Complaint  Patient presents with  . Mouth Lesions   Subjective:     HPI Patient here today c/o mouth sores, patient has tried nystatin and Dukes magic mouth. Patient reports that after using Dukes mouth was her sore got more irritated. Patient reports that she is able to eat and drink. Patient reports she can not taste anything. She reports that sores started after she had a steroid injection for her back.    Allergies  Allergen Reactions  . Nsaids Other (See Comments)    History of gastric ulcer  . Amoxicillin Nausea Only and Rash  . Ibandronic Acid Other (See Comments)    Muscle weakness - advised not to take  **BONIVA** - drug name  . Actonel  [Risedronate] Other (See Comments)    Gastric ulcers  . Antihistamines, Chlorpheniramine-Type   . Aspirin     Gastric ulcer  . Buspar [Buspirone] Other (See Comments)    Pt does not remember reaction   . Butalbital-Aspirin-Caffeine Other (See Comments)    Other reaction(s): Unknown  . Clarithromycin Other (See Comments)    Bloating  *BIAXIN*  . Codeine   . Gatifloxacin Other (See Comments)  . Hydrocodone-Guaifenesin Nausea Only    CODICLEAR DH STYRUP   . Moxifloxacin   . Oxycodone Other (See Comments)    Dizziness  . Penicillins Other (See Comments)  . Risedronate Sodium     Gastric ulcer  . Tylenol With Codeine #3  [Acetaminophen-Codeine]     Other reaction(s): Unknown  . Raloxifene Other (See Comments)    Bloating Bloating  *EVISTA*     Current Outpatient Medications:  .  ADVAIR DISKUS 250-50 MCG/DOSE AEPB, INHALE 1 PUFF INTO THE LUNGS 2 TIMES DAILY, Disp: 60 each, Rfl: 5 .  Calcium Carbonate-Vitamin D 600-400 MG-UNIT per tablet, Take 1 tablet by mouth 2 (two) times daily. , Disp: , Rfl:  .  diclofenac sodium (VOLTAREN) 1 % GEL, APPLY 2-4 GRAMS  TO AFFECTED AREA UP TO 3 TIMES A DAY AS NEEDED, Disp: 1 Tube, Rfl: 5 .  dicyclomine (BENTYL) 10 MG capsule, TAKE 1 CAPSULE BY MOUTH 3 TIMES A DAY ASNEEDED FOR SPASMS, Disp: 120 capsule, Rfl: 5 .  doxepin (SINEQUAN) 10 MG capsule, TAKE 3 CAPSULES BY MOUTH AT BEDTIME (Patient taking differently: Patient is taking 4 at bedtime), Disp: 270 capsule, Rfl: 1 .  fluticasone (FLONASE) 50 MCG/ACT nasal spray, USE TWO SPRAYS IN EACH NOSTRIL ONCE A DAY, Disp: 16 g, Rfl: 5 .  furosemide (LASIX) 20 MG tablet, TAKE 1 TABLET BY MOUTH DAILY AS NEEDED FOR EDEMA, Disp: 90 tablet, Rfl: 3 .  oxybutynin (DITROPAN XL) 15 MG 24 hr tablet, Take 15 mg by mouth at bedtime. , Disp: , Rfl:  .  pentosan polysulfate (ELMIRON) 100 MG capsule, Take 200 mg by mouth 2 (two) times daily. , Disp: , Rfl:  .  potassium chloride (K-DUR) 10 MEQ tablet, Take 1 tablet (10 mEq total) by mouth daily., Disp: 90 tablet, Rfl: 3 .  ULTRAM 50 MG tablet, Take 1 tablet (50 mg total) by mouth every 8 (eight) hours as needed. for pain., Disp: 90 tablet, Rfl: 5 .  urea (CARMOL) 40 % CREA, Apply topically., Disp: , Rfl:  .  VENTOLIN HFA 108 (90 Base) MCG/ACT inhaler, INHALE TWO  PUFFS INTO THE LUNGS EVERY 4 HOURS AS NEEDED FOR WHEEZING OR SHORTNESS OF BREATH, Disp: 18 g, Rfl: 5 .  verapamil (CALAN-SR) 180 MG CR tablet, Take 0.5 tablets (90 mg total) by mouth daily., Disp: 45 tablet, Rfl: 1  Review of Systems  Constitutional: Negative.   HENT: Positive for mouth sores.   Respiratory: Negative.   Cardiovascular: Negative.   Neurological: Negative.     Social History   Tobacco Use  . Smoking status: Never Smoker  . Smokeless tobacco: Never Used  Substance Use Topics  . Alcohol use: No      Objective:   BP (!) 143/85 (BP Location: Left Arm, Patient Position: Sitting, Cuff Size: Normal)   Pulse 64   Temp 98.5 F (36.9 C) (Oral)   Resp 16   Wt 116 lb (52.6 kg)   BMI 24.24 kg/m  Vitals:   12/02/18 1448  BP: (!) 143/85  Pulse: 64  Resp:  16  Temp: 98.5 F (36.9 C)  TempSrc: Oral  Weight: 116 lb (52.6 kg)     Physical Exam Vitals signs reviewed.  Constitutional:      General: She is not in acute distress.    Appearance: She is well-developed. She is not diaphoretic.  HENT:     Head: Normocephalic and atraumatic.     Right Ear: Hearing, tympanic membrane, ear canal and external ear normal.     Left Ear: Hearing, tympanic membrane, ear canal and external ear normal.     Nose: Nose normal.     Mouth/Throat:     Lips: Pink.     Mouth: Mucous membranes are dry. Oral lesions present.     Tongue: Lesions present.     Palate: Lesions present.     Pharynx: Uvula midline. Posterior oropharyngeal erythema present. No oropharyngeal exudate.   Eyes:     General: No scleral icterus.       Right eye: No discharge.        Left eye: No discharge.     Conjunctiva/sclera: Conjunctivae normal.     Pupils: Pupils are equal, round, and reactive to light.  Neck:     Musculoskeletal: Normal range of motion and neck supple.     Thyroid: No thyromegaly.     Trachea: No tracheal deviation.  Cardiovascular:     Rate and Rhythm: Normal rate and regular rhythm.     Heart sounds: Normal heart sounds. No murmur. No friction rub. No gallop.   Pulmonary:     Effort: Pulmonary effort is normal. No respiratory distress.     Breath sounds: Normal breath sounds. No stridor. No wheezing or rales.  Lymphadenopathy:     Cervical: No cervical adenopathy.  Skin:    General: Skin is warm and dry.         Assessment & Plan    1. Thrush, oral Will treat with diflucan as below since she has failed nystatin and duke's magic mouthwash. Advised patient we may do a standing order of 3 diflucan for her to get when she is due for her next back injection. She agrees.  - fluconazole (DIFLUCAN) 150 MG tablet; Take 1 tablet (150 mg total) by mouth daily.  Dispense: 7 tablet; Refill: 0  2. Irritable bowel syndrome with diarrhea Updated dosing for  future fill. Tolerates 40mg  better than 30mg , sleeping better.  - doxepin (SINEQUAN) 10 MG capsule; Patient is taking 4 at bedtime  Dispense: 360 capsule; Refill: 1  3. Primary insomnia See above medical  treatment plan. - doxepin (SINEQUAN) 10 MG capsule; Patient is taking 4 at bedtime  Dispense: 360 capsule; Refill: [place], PA-C  [facility]

## 2018-12-11 ENCOUNTER — Other Ambulatory Visit: Payer: Self-pay | Admitting: Physician Assistant

## 2018-12-11 DIAGNOSIS — S22000D Wedge compression fracture of unspecified thoracic vertebra, subsequent encounter for fracture with routine healing: Secondary | ICD-10-CM

## 2018-12-17 DIAGNOSIS — N393 Stress incontinence (female) (male): Secondary | ICD-10-CM | POA: Diagnosis not present

## 2018-12-17 DIAGNOSIS — N8110 Cystocele, unspecified: Secondary | ICD-10-CM | POA: Diagnosis not present

## 2018-12-17 DIAGNOSIS — N3010 Interstitial cystitis (chronic) without hematuria: Secondary | ICD-10-CM | POA: Diagnosis not present

## 2019-01-18 DIAGNOSIS — M810 Age-related osteoporosis without current pathological fracture: Secondary | ICD-10-CM | POA: Diagnosis not present

## 2019-01-27 ENCOUNTER — Other Ambulatory Visit: Payer: Self-pay | Admitting: Physician Assistant

## 2019-01-27 DIAGNOSIS — F5101 Primary insomnia: Secondary | ICD-10-CM

## 2019-01-27 DIAGNOSIS — K580 Irritable bowel syndrome with diarrhea: Secondary | ICD-10-CM

## 2019-01-27 MED ORDER — DOXEPIN HCL 10 MG PO CAPS: ORAL_CAPSULE | ORAL | 1 refills | Status: DC

## 2019-01-27 NOTE — Telephone Encounter (Signed)
Patient is wanting refills on Doxepin.  Said she is taking 5 pills a night instead of 4.

## 2019-01-27 NOTE — Telephone Encounter (Signed)
L.O.V. was 12/02/2018, please advise.

## 2019-01-29 ENCOUNTER — Ambulatory Visit
Admission: RE | Admit: 2019-01-29 | Discharge: 2019-01-29 | Disposition: A | Discharge status: Home / Self Care | Payer: Medicare HMO | Attending: Physician Assistant | Admitting: Physician Assistant

## 2019-01-29 ENCOUNTER — Encounter: Payer: Self-pay | Admitting: Physician Assistant

## 2019-01-29 ENCOUNTER — Ambulatory Visit: Payer: Medicare HMO | Admitting: Physician Assistant

## 2019-01-29 ENCOUNTER — Ambulatory Visit
Admission: RE | Admit: 2019-01-29 | Discharge: 2019-01-29 | Disposition: A | Discharge status: Home / Self Care | Payer: Medicare HMO | Source: Ambulatory Visit | Attending: Physician Assistant | Admitting: Physician Assistant

## 2019-01-29 VITALS — BP 120/70 | HR 74 | Temp 99.0°F | Resp 18 | Wt 121.2 lb

## 2019-01-29 DIAGNOSIS — R7981 Abnormal blood-gas level: Secondary | ICD-10-CM

## 2019-01-29 DIAGNOSIS — R0689 Other abnormalities of breathing: Secondary | ICD-10-CM

## 2019-01-29 DIAGNOSIS — J40 Bronchitis, not specified as acute or chronic: Secondary | ICD-10-CM

## 2019-01-29 DIAGNOSIS — J4540 Moderate persistent asthma, uncomplicated: Secondary | ICD-10-CM | POA: Diagnosis not present

## 2019-01-29 DIAGNOSIS — R0602 Shortness of breath: Secondary | ICD-10-CM | POA: Diagnosis not present

## 2019-01-29 MED ORDER — IPRATROPIUM-ALBUTEROL 0.5-2.5 (3) MG/3ML IN SOLN: 3.0000 mL | Freq: Once | RESPIRATORY_TRACT | Status: DC

## 2019-01-29 MED ORDER — TIOTROPIUM BROMIDE MONOHYDRATE 1.25 MCG/ACT IN AERS: 2.0000 | INHALATION_SPRAY | Freq: Every day | RESPIRATORY_TRACT | 5 refills | Status: DC

## 2019-01-29 NOTE — Progress Notes (Signed)
Patient: Jackie Turner Female    DOB: 05/13/1936   83 y.o.   MRN: [id number] Visit Date: 01/29/2019  Today's Provider: Mar Daring, PA-C   Chief Complaint  Patient presents with  . Shortness of Breath   Subjective:     HPI  Patient here today with c/o sob. Reports that this is worsening. She reports that she got some spells of "Trouble breathing" with light activity. Reports that about a week in half she was getting groceries out and she just couldn't catch her breath. Reports no other cold symptoms.  Patient reports that she is getting shots of Prolia and thinks she may be allergic to it. Having worsening joint pains after injections in the back (left side) and left hip.   Allergies  Allergen Reactions  . Nsaids Other (See Comments)    History of gastric ulcer  . Amoxicillin Nausea Only and Rash  . Ibandronic Acid Other (See Comments)    Muscle weakness - advised not to take  **BONIVA** - drug name  . Actonel  [Risedronate] Other (See Comments)    Gastric ulcers  . Antihistamines, Chlorpheniramine-Type   . Aspirin     Gastric ulcer  . Buspar [Buspirone] Other (See Comments)    Pt does not remember reaction   . Butalbital-Aspirin-Caffeine Other (See Comments)    Other reaction(s): Unknown  . Clarithromycin Other (See Comments)    Bloating  *BIAXIN*  . Codeine   . Gatifloxacin Other (See Comments)  . Hydrocodone-Guaifenesin Nausea Only    CODICLEAR DH STYRUP   . Moxifloxacin   . Oxycodone Other (See Comments)    Dizziness  . Penicillins Other (See Comments)  . Risedronate Sodium     Gastric ulcer  . Tylenol With Codeine #3  [Acetaminophen-Codeine]     Other reaction(s): Unknown  . Raloxifene Other (See Comments)    Bloating Bloating  *EVISTA*     Current Outpatient Medications:  .  ADVAIR DISKUS 250-50 MCG/DOSE AEPB, INHALE 1 PUFF INTO THE LUNGS 2 TIMES DAILY, Disp: 60 each, Rfl: 5 .  Calcium Carbonate-Vitamin D 600-400 MG-UNIT per  tablet, Take 1 tablet by mouth 2 (two) times daily. , Disp: , Rfl:  .  diclofenac sodium (VOLTAREN) 1 % GEL, APPLY 2-4 GRAMS TO AFFECTED AREA UP TO 3 TIMES A DAY AS NEEDED, Disp: 1 Tube, Rfl: 5 .  dicyclomine (BENTYL) 10 MG capsule, TAKE 1 CAPSULE BY MOUTH 3 TIMES A DAY ASNEEDED FOR SPASMS, Disp: 120 capsule, Rfl: 5 .  doxepin (SINEQUAN) 10 MG capsule, Patient is taking 5 at bedtime, Disp: 450 capsule, Rfl: 1 .  fluticasone (FLONASE) 50 MCG/ACT nasal spray, USE TWO SPRAYS IN EACH NOSTRIL ONCE A DAY, Disp: 16 g, Rfl: 5 .  furosemide (LASIX) 20 MG tablet, TAKE 1 TABLET BY MOUTH DAILY AS NEEDED FOR EDEMA, Disp: 90 tablet, Rfl: 3 .  oxybutynin (DITROPAN XL) 15 MG 24 hr tablet, Take 15 mg by mouth at bedtime. , Disp: , Rfl:  .  pentosan polysulfate (ELMIRON) 100 MG capsule, Take 200 mg by mouth 2 (two) times daily. , Disp: , Rfl:  .  potassium chloride (K-DUR) 10 MEQ tablet, Take 1 tablet (10 mEq total) by mouth daily., Disp: 90 tablet, Rfl: 3 .  ULTRAM 50 MG tablet, TAKE 1 TABLET BY MOUTH EVERY 8 HOURS AS NEEDED FOR PAIN., Disp: 90 tablet, Rfl: 5 .  urea (CARMOL) 40 % CREA, Apply topically., Disp: , Rfl:  .  VENTOLIN HFA 108 (90 Base) MCG/ACT inhaler, INHALE TWO PUFFS INTO THE LUNGS EVERY 4 HOURS AS NEEDED FOR WHEEZING OR SHORTNESS OF BREATH, Disp: 18 g, Rfl: 5 .  verapamil (CALAN-SR) 180 MG CR tablet, Take 0.5 tablets (90 mg total) by mouth daily., Disp: 45 tablet, Rfl: 1 .  fluconazole (DIFLUCAN) 150 MG tablet, Take 1 tablet (150 mg total) by mouth daily. (Patient not taking: Reported on 01/29/2019), Disp: 7 tablet, Rfl: 0  Review of Systems  Constitutional: Negative.   HENT: Negative.   Respiratory: Positive for cough (chronic, no worse than normal) and shortness of breath. Negative for chest tightness and wheezing.   Cardiovascular: Negative.   Gastrointestinal: Negative.   Musculoskeletal: Positive for arthralgias and back pain.  Neurological: Negative.     Social History   Tobacco Use  .  Smoking status: Never Smoker  . Smokeless tobacco: Never Used  Substance Use Topics  . Alcohol use: No      Objective:   BP 120/70 (BP Location: Left Arm, Patient Position: Sitting, Cuff Size: Normal)   Pulse 74   Temp 99 F (37.2 C) (Oral)   Resp 18   Wt 121 lb 3.2 oz (55 kg)   SpO2 (!) 88%   BMI 25.33 kg/m  Vitals:   01/29/19 1357  BP: 120/70  Pulse: 74  Resp: 18  Temp: 99 F (37.2 C)  TempSrc: Oral  SpO2: (!) 88%  Weight: 121 lb 3.2 oz (55 kg)     Physical Exam Vitals signs reviewed.  Constitutional:      General: She is not in acute distress.    Appearance: She is well-developed. She is not diaphoretic.  HENT:     Head: Normocephalic and atraumatic.     Right Ear: Hearing, tympanic membrane, ear canal and external ear normal.     Left Ear: Hearing, tympanic membrane, ear canal and external ear normal.     Nose: Nose normal.     Mouth/Throat:     Mouth: Mucous membranes are moist.     Pharynx: Oropharynx is clear. Uvula midline. No pharyngeal swelling or oropharyngeal exudate.  Eyes:     General: No scleral icterus.       Right eye: No discharge.        Left eye: No discharge.     Conjunctiva/sclera: Conjunctivae normal.     Pupils: Pupils are equal, round, and reactive to light.  Neck:     Musculoskeletal: Normal range of motion and neck supple.     Thyroid: No thyromegaly.     Trachea: No tracheal deviation.  Cardiovascular:     Rate and Rhythm: Normal rate and regular rhythm.     Heart sounds: Normal heart sounds. No murmur. No friction rub. No gallop.   Pulmonary:     Effort: Pulmonary effort is normal. No respiratory distress.     Breath sounds: No stridor. Examination of the left-lower field reveals rhonchi. Wheezing (throughout) and rhonchi present. No rales.  Lymphadenopathy:     Cervical: No cervical adenopathy.  Skin:    General: Skin is warm and dry.    CLINICAL DATA:  1-1/2 week history of shortness of breath. Wheezing and abnormal LEFT  LOWER LOBE breath sounds on clinical examination. Hypoxia. Current history of asthma.  EXAM: CHEST - 2 VIEW  COMPARISON:  03/04/2017 and earlier, including CT chest 11/13/2015. MRI thoracic spine 02/18/2018 is correlated.  FINDINGS: Cardiac silhouette moderately enlarged, unchanged. Thoracic aorta tortuous and atherosclerotic, unchanged. Prominent central pulmonary  arteries, unchanged. Prominent bronchovascular markings diffusely and mild to moderate central peribronchial thickening, unchanged. Chronic elevation of the LEFT hemidiaphragm with chronic scar/atelectasis in the LEFT LOWER LOBE and lingula, unchanged. Bronchiectasis in the LEFT LOWER LOBE as noted on the prior CT. Lungs otherwise clear. No localized airspace consolidation. No pleural effusions. No pneumothorax. Normal pulmonary vascularity. Remote compression fracture of T10 and to a lesser degree T9 as noted on the prior MRI.  IMPRESSION: 1.  No acute cardiopulmonary disease. 2. Stable chronic elevation of the LEFT hemidiaphragm and chronic scar/atelectasis in the LEFT LOWER LOBE and lingula. Stable chronic LEFT LOWER LOBE bronchiectasis. 3. Stable cardiomegaly without evidence of pulmonary edema.   Electronically Signed   By: Evangeline Dakin M.D.   On: 01/30/2019 07:35     Assessment & Plan    1. Adventitious breath sounds CXR obtained to R/O pneumonia due to localized lung sounds on exam in the L[facility]. Duoneb treatment given in the office and O2 improved to 91-92% after breathing treatment. Continue inhalers as prescribed. CXR did not show pneumonia, just chronic bronchitis and atelectasis. Will treat with zpak and prednisone as below. She is to call if no improvements or symptoms worsening.  - DG Chest 2 View; Future - ipratropium-albuterol (DUONEB) 0.5-2.5 (3) MG/3ML nebulizer solution 3 mL  2. Bronchitis See above medical treatment plan. - DG Chest 2 View; Future - ipratropium-albuterol (DUONEB)  0.5-2.5 (3) MG/3ML nebulizer solution 3 mL - azithromycin (ZITHROMAX) 250 MG tablet; Take 2 tabs PO on day 1, then 1 tab PO daily until completed  Dispense: 6 tablet; Refill: 0 - predniSONE (STERAPRED UNI-PAK 21 TAB) 10 MG (21) TBPK tablet; 6 day taper; take as directed on package instructions  Dispense: 21 tablet; Refill: 1  3. Low oxygen saturation See above medical treatment plan. - DG Chest 2 View; Future - ipratropium-albuterol (DUONEB) 0.5-2.5 (3) MG/3ML nebulizer solution 3 mL  4. Moderate persistent asthma without complication Stable. Diagnosis pulled for medication refill. Continue current medical treatment plan. - Tiotropium Bromide Monohydrate (SPIRIVA RESPIMAT) 1.25 MCG/ACT AERS; Inhale 2 puffs into the lungs daily.  Dispense: 4 g; Refill: [place], PA-C  [facility]

## 2019-01-30 ENCOUNTER — Other Ambulatory Visit: Payer: Self-pay | Admitting: Physician Assistant

## 2019-01-30 ENCOUNTER — Telehealth: Payer: Self-pay | Admitting: Physician Assistant

## 2019-01-30 DIAGNOSIS — K580 Irritable bowel syndrome with diarrhea: Secondary | ICD-10-CM

## 2019-01-30 DIAGNOSIS — F5101 Primary insomnia: Secondary | ICD-10-CM

## 2019-01-30 MED ORDER — PREDNISONE 10 MG (21) PO TBPK: ORAL_TABLET | ORAL | 1 refills | Status: DC

## 2019-01-30 MED ORDER — AZITHROMYCIN 250 MG PO TABS: ORAL_TABLET | ORAL | 0 refills | Status: DC

## 2019-01-30 NOTE — Telephone Encounter (Signed)
Patient called and stated that you prescribed Tiotropium Bromide Monohydrate (SPIRIVA RESPIMAT) 1.25 MCG/ACT AERS for her and that it was needing a PA.  She states that she can not afford to pay for it out of pocket.  Per Dr. Caryn Section I gave the patient one sample to last until you can work on her PA.

## 2019-02-01 ENCOUNTER — Telehealth: Payer: Self-pay | Admitting: Physician Assistant

## 2019-02-01 ENCOUNTER — Telehealth: Payer: Self-pay

## 2019-02-01 DIAGNOSIS — F5101 Primary insomnia: Secondary | ICD-10-CM

## 2019-02-01 DIAGNOSIS — K580 Irritable bowel syndrome with diarrhea: Secondary | ICD-10-CM

## 2019-02-01 MED ORDER — DOXEPIN HCL 10 MG PO CAPS: ORAL_CAPSULE | ORAL | 1 refills | Status: DC

## 2019-02-01 NOTE — Telephone Encounter (Signed)
PA was approved and patient was advised.

## 2019-02-01 NOTE — Telephone Encounter (Signed)
Patient was advised.  

## 2019-02-01 NOTE — Telephone Encounter (Signed)
Resent

## 2019-02-01 NOTE — Telephone Encounter (Signed)
PA has been completed waiting on determination.

## 2019-02-01 NOTE — Telephone Encounter (Signed)
I called pharmacy and confirmed that prescription was received.

## 2019-02-01 NOTE — Telephone Encounter (Signed)
Estill Bamberg from [facility] called saying they got a denial from [place] for the Doxepin 10 mg saying that [place] prescribed this on 01/29/19 with different directions.  They say they have not received anything regarding this rx.  CB# [phone]  Thanks [place]

## 2019-02-01 NOTE — Telephone Encounter (Signed)
-----   Message from Mar Daring, PA-C sent at 01/30/2019 11:34 AM EST ----- CXR normal. Zpak and prednisone sent in

## 2019-02-02 ENCOUNTER — Encounter: Payer: Self-pay | Admitting: Physician Assistant

## 2019-02-12 DIAGNOSIS — L821 Other seborrheic keratosis: Secondary | ICD-10-CM | POA: Diagnosis not present

## 2019-02-12 DIAGNOSIS — D2262 Melanocytic nevi of left upper limb, including shoulder: Secondary | ICD-10-CM | POA: Diagnosis not present

## 2019-02-12 DIAGNOSIS — D225 Melanocytic nevi of trunk: Secondary | ICD-10-CM | POA: Diagnosis not present

## 2019-02-12 DIAGNOSIS — D2261 Melanocytic nevi of right upper limb, including shoulder: Secondary | ICD-10-CM | POA: Diagnosis not present

## 2019-02-12 DIAGNOSIS — D2271 Melanocytic nevi of right lower limb, including hip: Secondary | ICD-10-CM | POA: Diagnosis not present

## 2019-02-12 DIAGNOSIS — D2272 Melanocytic nevi of left lower limb, including hip: Secondary | ICD-10-CM | POA: Diagnosis not present

## 2019-02-12 DIAGNOSIS — Z85828 Personal history of other malignant neoplasm of skin: Secondary | ICD-10-CM | POA: Diagnosis not present

## 2019-02-12 DIAGNOSIS — Z08 Encounter for follow-up examination after completed treatment for malignant neoplasm: Secondary | ICD-10-CM | POA: Diagnosis not present

## 2019-02-21 IMAGING — MR MR THORACIC SPINE W/O CM
5 of 7 series · 34 of 48 positions shown · non-contrast
Comparison: CT chest 11/13/2015. Plain films thoracic spine
04/24/2017 and plain films of the chest 03/14/2017.

CLINICAL DATA: Pulling injury in the mid back 1 month ago with
continued right worse than left pain radiating into both ribs.

EXAM:
MRI THORACIC SPINE WITHOUT CONTRAST
TECHNIQUE: Multiplanar, multisequence MR imaging of the thoracic spine was
performed. No intravenous contrast was administered.

[Series 6: T2 · coronal · 5.0mm · 1.33mm/px · 6 of 19 slices shown (1 of 3)]
[im 1/19]
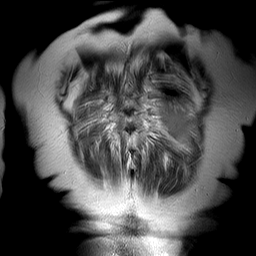
[im 4/19]
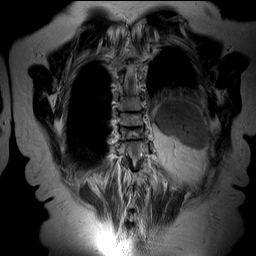
[im 8/19]
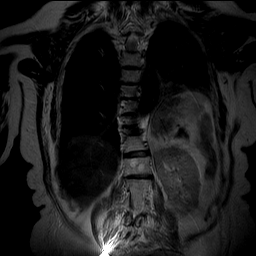
[im 11/19]
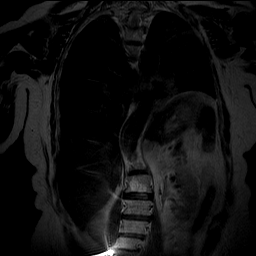
[im 15/19]
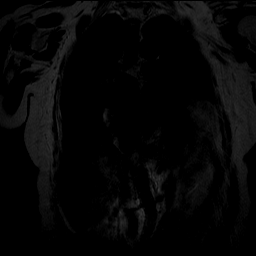
[im 19/19]
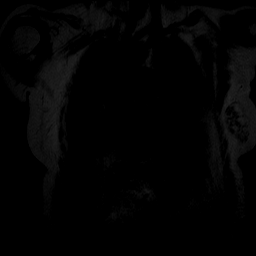

[Series 7: T2 · sagittal · 3.0mm · 1.25mm/px · 6 of 21 slices shown (2 of 3)]
[im 1/21]
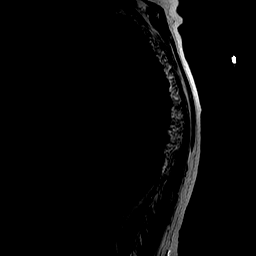
[im 5/21]
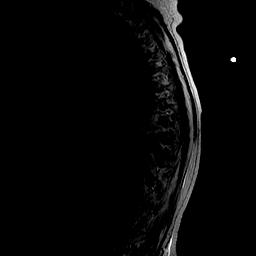
[im 9/21]
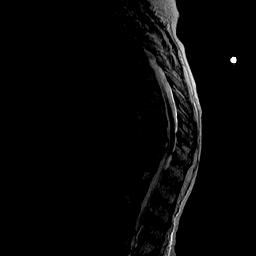
[im 13/21]
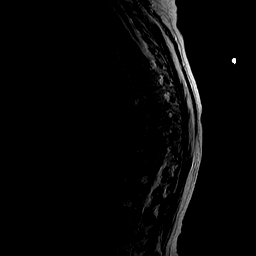
[im 17/21]
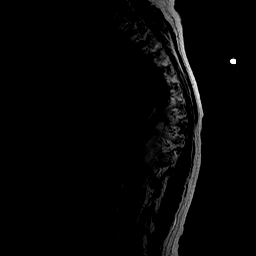
[im 21/21]
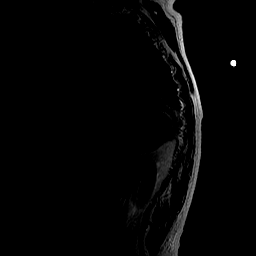

[Series 8: T1 · sagittal · 3.0mm · 0.62mm/px · 6 of 21 slices shown]
[im 1/21]
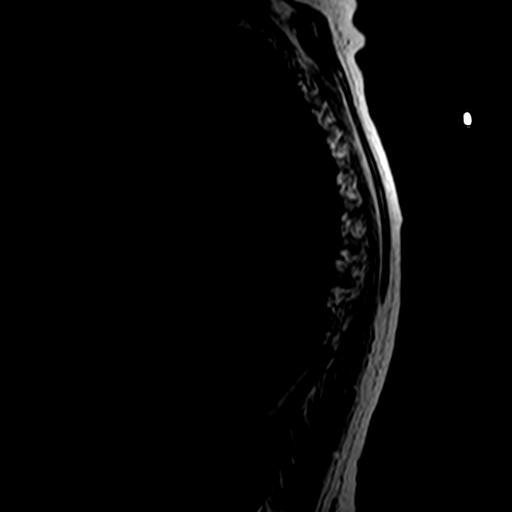
[im 5/21]
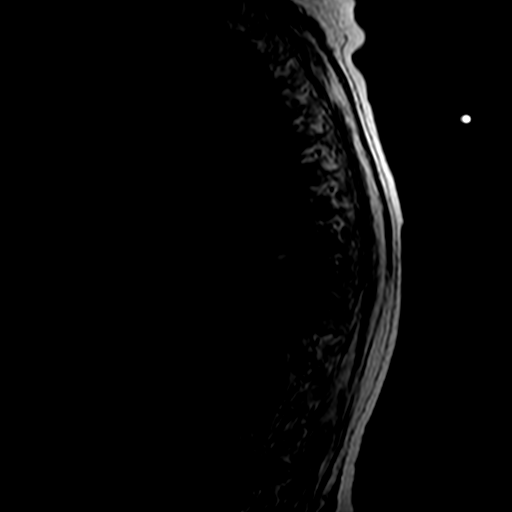
[im 9/21]
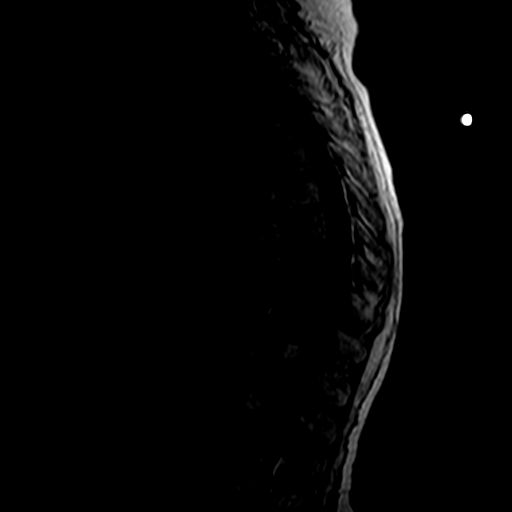
[im 13/21]
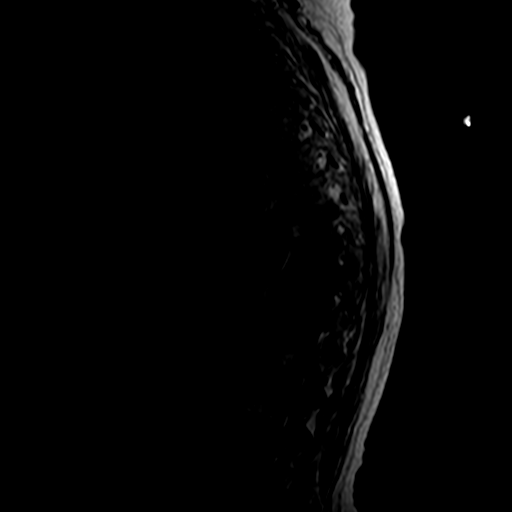
[im 17/21]
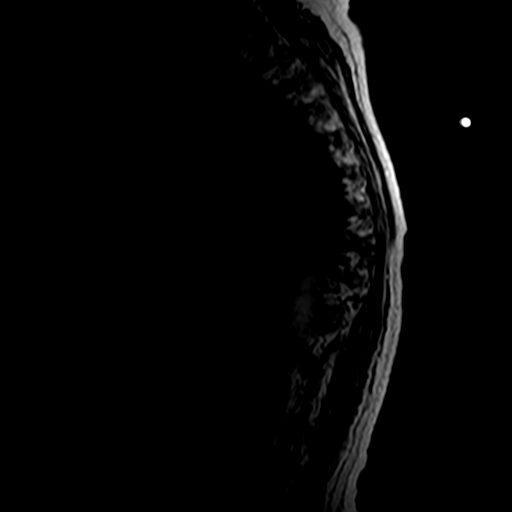
[im 21/21]
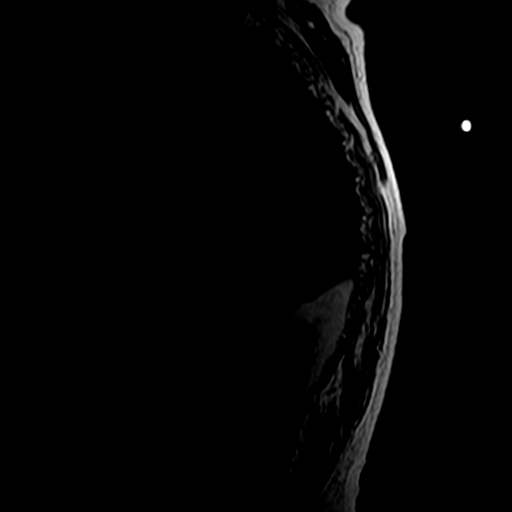

[Series 9: STIR · sagittal · 3.0mm · 0.62mm/px · 5 of 21 slices shown]
[im 1/21]
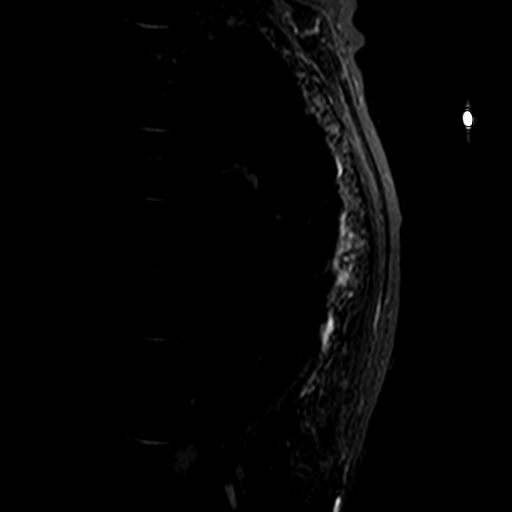
[im 5/21]
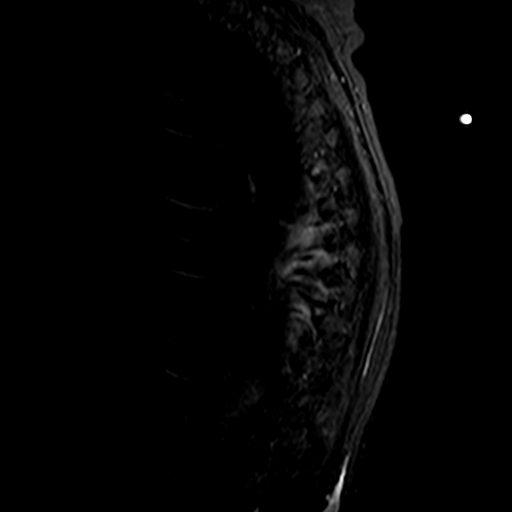
[im 9/21]
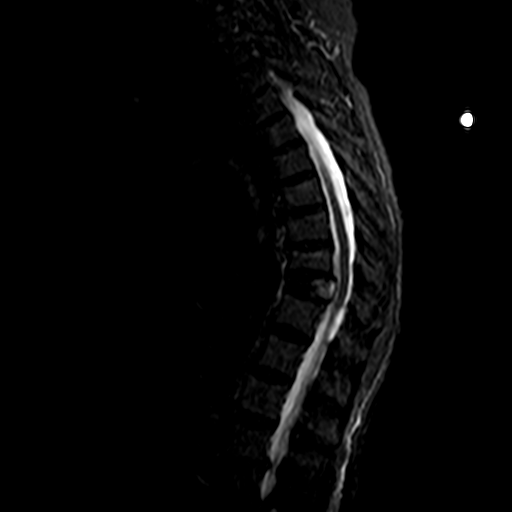
[im 13/21]
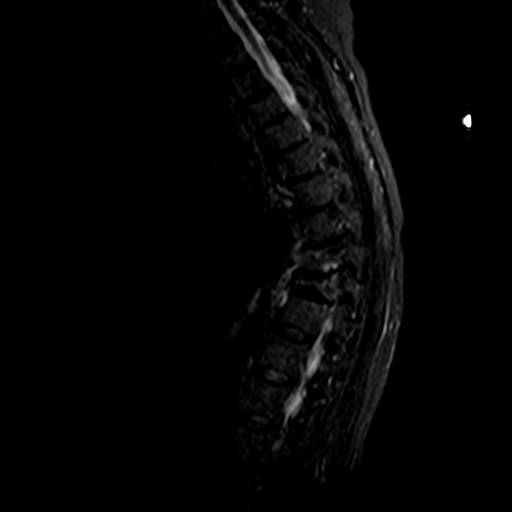
[im 17/21]
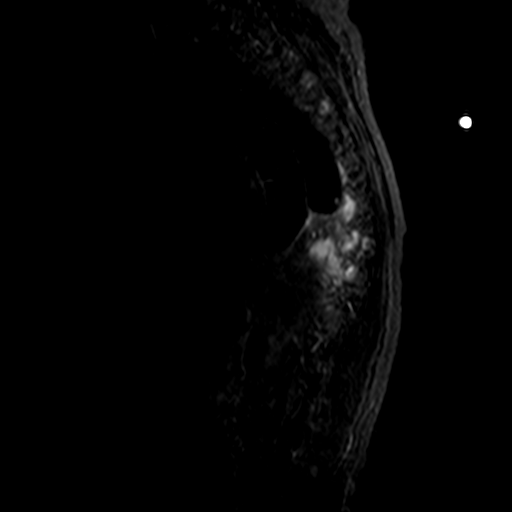

[Series 10: T2 · axial · 5.0mm · 0.86mm/px · z∈[-133,+36]mm · 11 of 39 slices shown (3 of 3)]
[im 1/39]
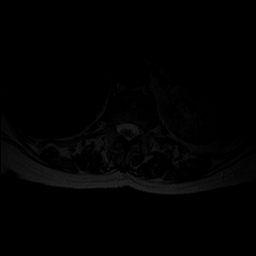
[im 4/39]
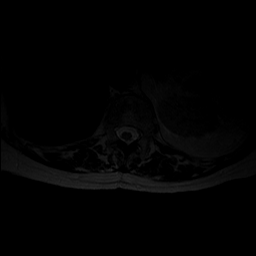
[im 8/39]
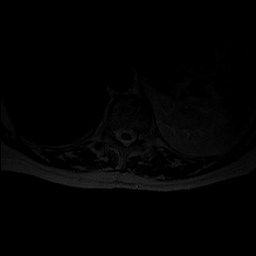
[im 12/39]
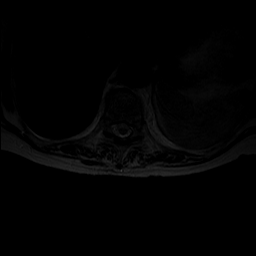
[im 16/39]
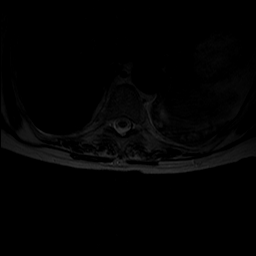
[im 20/39]
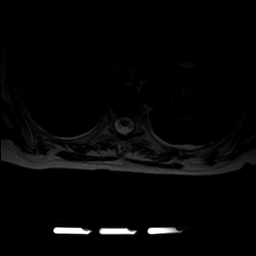
[im 23/39]
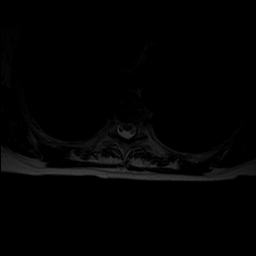
[im 27/39]
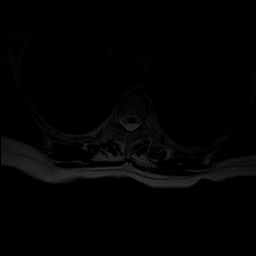
[im 31/39]
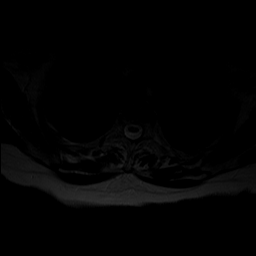
[im 35/39]
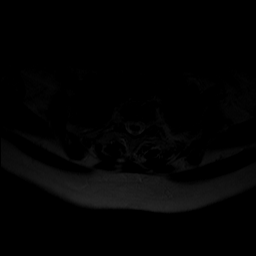
[im 39/39]
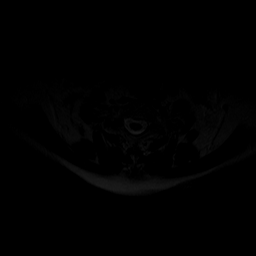

[34 of 48 positions shown; findings below may reference images not displayed]

FINDINGS: Alignment:  No listhesis.

Vertebrae: As seen on the comparison plain films, the patient has a
T10 compression fracture with vertebra plana deformity anteriorly.
There is bony retropulsion as described below. Marrow edema within
the vertebral body is consistent with acute or subacute injury.
Superior endplate compression fracture of T9 is also seen with
vertebral body height loss of approximately 50% anteriorly. There is
minimal marrow edema within the vertebral body. No other fracture is
identified. Hemangiomas in T12 and L1 are noted. Marrow signal is
otherwise unremarkable.

Cord:  Normal signal throughout.

Paraspinal and other soft tissues: Negative.

Disc levels:

T1-2: Minimal central protrusion without central canal or foraminal
stenosis.

T3-4: Tiny central protrusion without central canal or foraminal
stenosis.

T5-6: Small central protrusion without central canal or foraminal
stenosis.

T9-10, T10-11: The posterior margin of the T10 vertebral body is
retropulsed into the central canal. Bone narrows but does not quite
efface the ventral thecal sac. No cord deformity or signal
abnormality. The foramina appear open at both levels.

Except as noted above, intervertebral disc spaces are unremarkable.
IMPRESSION: Findings consistent with an acute or subacute compression fracture
of T10 with vertebra plana deformity anteriorly. Retropulsion of the
posterior margin of T10 into the central canal narrows but does not
quite as the face the ventral thecal sac. No cord deformity or
signal change.

Superior endplate compression fracture of T9 without bony
retropulsion. There is minimal marrow edema in the superior endplate
most consistent with late subacute to remote injury.

## 2019-03-02 ENCOUNTER — Telehealth: Payer: Self-pay | Admitting: Physician Assistant

## 2019-03-02 DIAGNOSIS — J40 Bronchitis, not specified as acute or chronic: Secondary | ICD-10-CM

## 2019-03-02 NOTE — Telephone Encounter (Signed)
Sinus headache- left eye and infection Temp to 99 during the day and goes to 98.  Asking if Tawanna Sat could call her in something like and antibiotic to clear this up.  Pt doesn't want to come in due to age and Coronavirus.  Please advise.  Thanks, [employer]

## 2019-03-02 NOTE — Telephone Encounter (Signed)
Please advise 

## 2019-03-03 MED ORDER — AZITHROMYCIN 250 MG PO TABS: ORAL_TABLET | ORAL | 0 refills | Status: DC

## 2019-03-03 NOTE — Telephone Encounter (Signed)
Patient advised as below.  

## 2019-03-03 NOTE — Telephone Encounter (Signed)
Sent in zpak for her. Call if not improving or worsening.

## 2019-03-12 ENCOUNTER — Encounter: Payer: Self-pay | Admitting: Physician Assistant

## 2019-03-12 ENCOUNTER — Ambulatory Visit: Payer: Medicare HMO | Admitting: Physician Assistant

## 2019-03-12 ENCOUNTER — Other Ambulatory Visit: Payer: Self-pay

## 2019-03-12 VITALS — BP 145/71 | HR 87 | Temp 99.2°F | Resp 20 | Wt 119.8 lb

## 2019-03-12 DIAGNOSIS — J4540 Moderate persistent asthma, uncomplicated: Secondary | ICD-10-CM | POA: Diagnosis not present

## 2019-03-12 DIAGNOSIS — R600 Localized edema: Secondary | ICD-10-CM | POA: Diagnosis not present

## 2019-03-12 DIAGNOSIS — B370 Candidal stomatitis: Secondary | ICD-10-CM

## 2019-03-12 MED ORDER — NYSTATIN 100000 UNIT/ML MT SUSP: 5.0000 mL | Freq: Four times a day (QID) | OROMUCOSAL | 0 refills | Status: DC

## 2019-03-12 NOTE — Patient Instructions (Addendum)
Repeat Bone Density in middle June 2020   Chronic Venous Insufficiency Chronic venous insufficiency, also called venous stasis, is a condition that prevents blood from being pumped effectively through the veins in your legs. Blood may no longer be pumped effectively from the legs back to the heart. This condition can range from mild to severe. With proper treatment, you should be able to continue with an active life. What are the causes? Chronic venous insufficiency occurs when the vein walls become stretched, weakened, or damaged, or when valves within the vein are damaged. Some common causes of this include:  High blood pressure inside the veins (venous hypertension).  Increased blood pressure in the leg veins from long periods of sitting or standing.  A blood clot that blocks blood flow in a vein (deep vein thrombosis, DVT).  Inflammation of a vein (phlebitis) that causes a blood clot to form.  Tumors in the pelvis that cause blood to back up. What increases the risk? The following factors may make you more likely to develop this condition:  Having a family history of this condition.  Obesity.  Pregnancy.  Living without enough physical activity or exercise (sedentary lifestyle).  Smoking.  Having a job that requires long periods of standing or sitting in one place.  Being a certain age. Women in their 101s and 53s and men in their 29s are more likely to develop this condition. What are the signs or symptoms? Symptoms of this condition include:  Veins that are enlarged, bulging, or twisted (varicose veins).  Skin breakdown or ulcers.  Reddened or discolored skin on the front of the leg.  Brown, smooth, tight, and painful skin just above the ankle, usually on the inside of the leg (lipodermatosclerosis).  Swelling. How is this diagnosed? This condition may be diagnosed based on:  Your medical history.  A physical exam.  Tests, such as: ? A procedure that creates  an image of a blood vessel and nearby organs and provides information about blood flow through the blood vessel (duplex ultrasound). ? A procedure that tests blood flow (plethysmography). ? A procedure to look at the veins using X-ray and dye (venogram). How is this treated? The goals of treatment are to help you return to an active life and to minimize pain or disability. Treatment depends on the severity of your condition, and it may include:  Wearing compression stockings. These can help relieve symptoms and help prevent your condition from getting worse. However, they do not cure the condition.  Sclerotherapy. This is a procedure involving an injection of a material that "dissolves" damaged veins.  Surgery. This may involve: ? Removing a diseased vein (vein stripping). ? Cutting off blood flow through the vein (laser ablation surgery). ? Repairing a valve. Follow these instructions at home:      Wear compression stockings as told by your health care provider. These stockings help to prevent blood clots and reduce swelling in your legs.  Take over-the-counter and prescription medicines only as told by your health care provider.  Stay active by exercising, walking, or doing different activities. Ask your health care provider what activities are safe for you and how much exercise you need.  Drink enough fluid to keep your urine clear or pale yellow.  Do not use any products that contain nicotine or tobacco, such as cigarettes and e-cigarettes. If you need help quitting, ask your health care provider.  Keep all follow-up visits as told by your health care provider. This is important.  Contact a health care provider if:  You have redness, swelling, or more pain in the affected area.  You see a red streak or line that extends up or down from the affected area.  You have skin breakdown or a loss of skin in the affected area, even if the breakdown is small.  You get an injury in the  affected area. Get help right away if:  You get an injury and an open wound in the affected area.  You have severe pain that does not get better with medicine.  You have sudden numbness or weakness in the foot or ankle below the affected area, or you have trouble moving your foot or ankle.  You have a fever and you have worse or persistent symptoms.  You have chest pain.  You have shortness of breath. Summary  Chronic venous insufficiency, also called venous stasis, is a condition that prevents blood from being pumped effectively through the veins in your legs.  Chronic venous insufficiency occurs when the vein walls become stretched, weakened, or damaged, or when valves within the vein are damaged.  Treatment for this condition depends on how severe your condition is, and it may involve wearing compression stockings or having a procedure.  Make sure you stay active by exercising, walking, or doing different activities. Ask your health care provider what activities are safe for you and how much exercise you need. This information is not intended to replace advice given to you by your health care provider. Make sure you discuss any questions you have with your health care provider. Document Released: 04/07/2007 Document Revised: 10/21/2016 Document Reviewed: 10/21/2016 Elsevier Interactive Patient Education  2019 [employer].

## 2019-03-12 NOTE — Progress Notes (Signed)
Patient: Jackie Turner Female    DOB: Jun 16, 1936   83 y.o.   MRN: [id number] Visit Date: 03/12/2019  Today's Provider: Mar Daring, PA-C   Chief Complaint  Patient presents with  . Leg Swelling  . Asthma   Subjective:     HPI Patient here today c/o right foot swelling on and off x's several days. Patient reports that she has been taking Lasix PRN, reports mild symptom control. Has seen Dr. Lucky Cowboy over a year ago but did not f/u for venous imaging.   Patient also c/o white spots in her mouth due to recent antibiotic treatment. Does have h/o thrush after antibiotics   Patient c/o chest congestion on and off and shortness breath. Patient reports symptoms were worse this morning. Patient reports using her inhalers as prescribed. This is a chronic issue. Has not noted increased mucous production, cough, sinus pain or fever.    Allergies  Allergen Reactions  . Nsaids Other (See Comments)    History of gastric ulcer  . Amoxicillin Nausea Only and Rash  . Ibandronic Acid Other (See Comments)    Muscle weakness - advised not to take  **BONIVA** - drug name  . Actonel  [Risedronate] Other (See Comments)    Gastric ulcers  . Antihistamines, Chlorpheniramine-Type   . Aspirin     Gastric ulcer  . Buspar [Buspirone] Other (See Comments)    Pt does not remember reaction   . Butalbital-Aspirin-Caffeine Other (See Comments)    Other reaction(s): Unknown  . Clarithromycin Other (See Comments)    Bloating  *BIAXIN*  . Codeine   . Gatifloxacin Other (See Comments)  . Hydrocodone-Guaifenesin Nausea Only    CODICLEAR DH STYRUP   . Moxifloxacin   . Oxycodone Other (See Comments)    Dizziness  . Penicillins Other (See Comments)  . Risedronate Sodium     Gastric ulcer  . Tylenol With Codeine #3  [Acetaminophen-Codeine]     Other reaction(s): Unknown  . Raloxifene Other (See Comments)    Bloating Bloating  *EVISTA*     Current Outpatient Medications:  .   ADVAIR DISKUS 250-50 MCG/DOSE AEPB, INHALE 1 PUFF INTO THE LUNGS 2 TIMES DAILY, Disp: 60 each, Rfl: 5 .  Calcium Carbonate-Vitamin D 600-400 MG-UNIT per tablet, Take 1 tablet by mouth 2 (two) times daily. , Disp: , Rfl:  .  diclofenac sodium (VOLTAREN) 1 % GEL, APPLY 2-4 GRAMS TO AFFECTED AREA UP TO 3 TIMES A DAY AS NEEDED, Disp: 1 Tube, Rfl: 5 .  dicyclomine (BENTYL) 10 MG capsule, TAKE 1 CAPSULE BY MOUTH 3 TIMES A DAY ASNEEDED FOR SPASMS, Disp: 120 capsule, Rfl: 5 .  doxepin (SINEQUAN) 10 MG capsule, Patient is taking 5 at bedtime, Disp: 450 capsule, Rfl: 1 .  fluticasone (FLONASE) 50 MCG/ACT nasal spray, USE TWO SPRAYS IN EACH NOSTRIL ONCE A DAY, Disp: 16 g, Rfl: 5 .  furosemide (LASIX) 20 MG tablet, TAKE 1 TABLET BY MOUTH DAILY AS NEEDED FOR EDEMA, Disp: 90 tablet, Rfl: 3 .  oxybutynin (DITROPAN XL) 15 MG 24 hr tablet, Take 15 mg by mouth at bedtime. , Disp: , Rfl:  .  pentosan polysulfate (ELMIRON) 100 MG capsule, Take 200 mg by mouth 2 (two) times daily. , Disp: , Rfl:  .  potassium chloride (K-DUR) 10 MEQ tablet, Take 1 tablet (10 mEq total) by mouth daily., Disp: 90 tablet, Rfl: 3 .  Tiotropium Bromide Monohydrate (SPIRIVA RESPIMAT) 1.25 MCG/ACT AERS, Inhale 2  puffs into the lungs daily., Disp: 4 g, Rfl: 5 .  ULTRAM 50 MG tablet, TAKE 1 TABLET BY MOUTH EVERY 8 HOURS AS NEEDED FOR PAIN., Disp: 90 tablet, Rfl: 5 .  urea (CARMOL) 40 % CREA, Apply topically., Disp: , Rfl:  .  VENTOLIN HFA 108 (90 Base) MCG/ACT inhaler, INHALE TWO PUFFS INTO THE LUNGS EVERY 4 HOURS AS NEEDED FOR WHEEZING OR SHORTNESS OF BREATH, Disp: 18 g, Rfl: 5 .  verapamil (CALAN-SR) 180 MG CR tablet, Take 0.5 tablets (90 mg total) by mouth daily., Disp: 45 tablet, Rfl: 1  Current Facility-Administered Medications:  .  ipratropium-albuterol (DUONEB) 0.5-2.5 (3) MG/3ML nebulizer solution 3 mL, 3 mL, Nebulization, Once, Burnette, Clearnce Sorrel, PA-C  Review of Systems  Constitutional: Negative.  Negative for fatigue and fever.   HENT: Positive for congestion and mouth sores. Negative for ear pain, postnasal drip, rhinorrhea, sinus pressure, sinus pain, sneezing and sore throat.   Respiratory: Positive for cough and shortness of breath. Negative for chest tightness and wheezing.   Cardiovascular: Positive for leg swelling (right leg).  Gastrointestinal: Negative for abdominal pain and nausea.  Neurological: Negative for dizziness and headaches.    Social History   Tobacco Use  . Smoking status: Never Smoker  . Smokeless tobacco: Never Used  Substance Use Topics  . Alcohol use: No      Objective:   BP (!) 145/71 (BP Location: Left Arm, Patient Position: Sitting, Cuff Size: Normal)   Pulse 87   Temp 99.2 F (37.3 C) (Oral)   Resp 20   Wt 119 lb 12.8 oz (54.3 kg)   SpO2 93%   BMI 25.04 kg/m  Vitals:   03/12/19 1343  BP: (!) 145/71  Pulse: 87  Resp: 20  Temp: 99.2 F (37.3 C)  TempSrc: Oral  SpO2: 93%  Weight: 119 lb 12.8 oz (54.3 kg)     Physical Exam Vitals signs reviewed.  Constitutional:      General: She is not in acute distress.    Appearance: Normal appearance. She is well-developed and normal weight. She is not ill-appearing or diaphoretic.  HENT:     Head: Normocephalic and atraumatic.     Right Ear: Hearing, tympanic membrane, ear canal and external ear normal.     Left Ear: Hearing, tympanic membrane, ear canal and external ear normal.     Nose: Nose normal. No congestion or rhinorrhea.     Mouth/Throat:     Mouth: Mucous membranes are moist.     Pharynx: Uvula midline. No oropharyngeal exudate or posterior oropharyngeal erythema.  Eyes:     General: No scleral icterus.       Right eye: No discharge.        Left eye: No discharge.     Conjunctiva/sclera: Conjunctivae normal.     Pupils: Pupils are equal, round, and reactive to light.  Neck:     Musculoskeletal: Normal range of motion and neck supple.     Thyroid: No thyromegaly.     Trachea: No tracheal deviation.   Cardiovascular:     Rate and Rhythm: Normal rate and regular rhythm.     Heart sounds: Normal heart sounds. No murmur. No friction rub. No gallop.   Pulmonary:     Effort: Pulmonary effort is normal. No respiratory distress.     Breath sounds: Normal breath sounds. No stridor. No wheezing or rales.  Musculoskeletal:     Right lower leg: Edema (from mid calf to mid foot with some  reddening of the skin) present.     Left lower leg: No edema.  Lymphadenopathy:     Cervical: No cervical adenopathy.  Skin:    General: Skin is warm and dry.  Neurological:     Mental Status: She is alert.        Assessment & Plan    1. Moderate persistent asthma without complication Stable at this time. Lung sounds normal. O2 sat stable at 94% (patient average from 94-97%). Continue inhalers as prescribed.   2. Edema of right lower extremity Worsening. Discussed use of compression stockings, continue furosemide prn, elevate legs when needed. Patient has had a few nights were she has had the toothache/throbbing pain by the end of the day. Has seen Dr. Lucky Cowboy last year but did not f/u with the venous duplex that was recommended. She is interested in that at this time. Will refer back to the vein clinic as below for further evaluation.  - Ambulatory referral to Vascular Surgery  3. Thrush Secondary to antibiotic use in February. Will treat with nystatin as below.  - nystatin (MYCOSTATIN) 100000 UNIT/ML suspension; Take 5 mLs (500,000 Units total) by mouth 4 (four) times daily.  Dispense: 60 mL; Refill: 0     Mar Daring, PA-C  [facility]

## 2019-03-25 ENCOUNTER — Telehealth: Payer: Self-pay

## 2019-03-25 NOTE — Telephone Encounter (Signed)
Patient prefers to continue on the Spiriva inhaler than switching to Incruse Ellipta inhaler 62.5 mg for right now because the Spiriva is working on her.

## 2019-03-29 ENCOUNTER — Other Ambulatory Visit: Payer: Self-pay

## 2019-03-29 ENCOUNTER — Ambulatory Visit (INDEPENDENT_AMBULATORY_CARE_PROVIDER_SITE_OTHER): Payer: Medicare HMO | Admitting: Nurse Practitioner

## 2019-03-29 ENCOUNTER — Encounter (INDEPENDENT_AMBULATORY_CARE_PROVIDER_SITE_OTHER): Payer: Self-pay | Admitting: Nurse Practitioner

## 2019-03-29 VITALS — BP 107/66 | HR 80 | Resp 16 | Ht <= 58 in | Wt 121.0 lb

## 2019-03-29 DIAGNOSIS — E7800 Pure hypercholesterolemia, unspecified: Secondary | ICD-10-CM | POA: Diagnosis not present

## 2019-03-29 DIAGNOSIS — Z79899 Other long term (current) drug therapy: Secondary | ICD-10-CM

## 2019-03-29 DIAGNOSIS — J4540 Moderate persistent asthma, uncomplicated: Secondary | ICD-10-CM | POA: Diagnosis not present

## 2019-03-29 DIAGNOSIS — I83893 Varicose veins of bilateral lower extremities with other complications: Secondary | ICD-10-CM | POA: Diagnosis not present

## 2019-03-29 DIAGNOSIS — R600 Localized edema: Secondary | ICD-10-CM

## 2019-03-30 ENCOUNTER — Telehealth: Payer: Self-pay

## 2019-03-30 ENCOUNTER — Encounter (INDEPENDENT_AMBULATORY_CARE_PROVIDER_SITE_OTHER): Payer: Self-pay | Admitting: Nurse Practitioner

## 2019-03-30 NOTE — Telephone Encounter (Signed)
Patient called and stated she is experiencing SOB but no fever or any other symptoms at this moment. Patient states she was walking from her car to house carrying groceries. Patient said she has been having the SOB for the past 2 day's now and would like you advice on what she needs to do. Please advise.

## 2019-03-30 NOTE — Progress Notes (Signed)
SUBJECTIVE:  Patient ID: Jackie Turner, female    DOB: Nov 04, 1936, 83 y.o.   MRN: [id number] Chief Complaint  Patient presents with  . Follow-up    right leg edema    HPI  Jackie Turner is a 83 y.o. female Patient is seen for evaluation of leg swelling. The patient first noticed the swelling remotely but is now concerned because of a significant increase in the overall edema. The swelling is associated with pain and discoloration. The patient notes that in the morning the legs are significantly improved but they steadily worsened throughout the course of the day. Elevation makes the legs better, dependency makes them much worse.   There is no history of ulcerations associated with the swelling.   The patient denies any recent changes in their medications.  The patient has worn compression stockings, however not consistently.  She does note that with usage of compression stockings the swelling is better.  The patient has no had any past angiography, interventions or vascular surgery.  The patient denies a history of DVT or PE. There is no prior history of phlebitis. There is no history of primary lymphedema.  There is no history of radiation treatment to the groin or pelvis No history of malignancies. No history of trauma or groin or pelvic surgery. No history of foreign travel or parasitic infections area    Past Medical History:  Diagnosis Date  . Allergy   . Asthma   . Hyperlipidemia   . Osteoporosis     Past Surgical History:  Procedure Laterality Date  . ABDOMINAL HYSTERECTOMY  2003  . BREAST BIOPSY  1976, 1978   benign  . carpal tunnel repair Bilateral    R 1990; L 1997  . EYE SURGERY Bilateral    cataract extraction  . HAND TENDON SURGERY Right    Trigger finger  . IR RADIOLOGIST EVAL & MGMT  06/13/2017  . IR VERTEBROPLASTY CERV/THOR BX INC UNI/BIL INC/INJECT/IMAGING  06/17/2017  . TONSILLECTOMY  1961    Social History   Socioeconomic  History  . Marital status: Widowed    Spouse name: Jackie Turner  . Number of children: 0  . Years of education: H/S  . Highest education level: 12th grade  Occupational History  . Occupation: Retired  Scientific laboratory technician  . Financial resource strain: Not hard at all  . Food insecurity:    Worry: Never true    Inability: Never true  . Transportation needs:    Medical: No    Non-medical: No  Tobacco Use  . Smoking status: Never Smoker  . Smokeless tobacco: Never Used  Substance and Sexual Activity  . Alcohol use: No  . Drug use: No  . Sexual activity: Never  Lifestyle  . Physical activity:    Days per week: Not on file    Minutes per session: Not on file  . Stress: Only a little  Relationships  . Social connections:    Talks on phone: Not on file    Gets together: Not on file    Attends religious service: Not on file    Active member of club or organization: Not on file    Attends meetings of clubs or organizations: Not on file    Relationship status: Not on file  . Intimate partner violence:    Fear of current or ex partner: Not on file    Emotionally abused: Not on file    Physically abused: Not on file    Forced  sexual activity: Not on file  Other Topics Concern  . Not on file  Social History Narrative  . Not on file    Family History  Problem Relation Age of Onset  . Heart disease Mother   . CAD Mother   . Congestive Heart Failure Mother   . Osteoarthritis Mother   . Cancer Sister        breast  . Breast cancer Sister     Allergies  Allergen Reactions  . Nsaids Other (See Comments)    History of gastric ulcer  . Amoxicillin Nausea Only and Rash  . Ibandronic Acid Other (See Comments)    Muscle weakness - advised not to take  **BONIVA** - drug name  . Actonel  [Risedronate] Other (See Comments)    Gastric ulcers  . Antihistamines, Chlorpheniramine-Type   . Aspirin     Gastric ulcer  . Buspar [Buspirone] Other (See Comments)    Pt does not remember reaction    . Butalbital-Aspirin-Caffeine Other (See Comments)    Other reaction(s): Unknown  . Clarithromycin Other (See Comments)    Bloating  *BIAXIN*  . Codeine   . Gatifloxacin Other (See Comments)  . Hydrocodone-Guaifenesin Nausea Only    CODICLEAR DH STYRUP   . Moxifloxacin   . Oxycodone Other (See Comments)    Dizziness  . Penicillins Other (See Comments)  . Risedronate Sodium     Gastric ulcer  . Tylenol With Codeine #3  [Acetaminophen-Codeine]     Other reaction(s): Unknown  . Raloxifene Other (See Comments)    Bloating Bloating  *EVISTA*     Review of Systems   Review of Systems: Negative Unless Checked Constitutional: [] Weight loss  [] Fever  [] Chills Cardiac: [] Chest pain   []  Atrial Fibrillation  [] Palpitations   [] Shortness of breath when laying flat   [] Shortness of breath with exertion. [] Shortness of breath at rest Vascular:  [] Pain in legs with walking   [] Pain in legs with standing [] Pain in legs when laying flat   [] Claudication    [] Pain in feet when laying flat    [] History of DVT   [] Phlebitis   [x] Swelling in legs   [x] Varicose veins   [] Non-healing ulcers Pulmonary:   [] Uses home oxygen   [] Productive cough   [] Hemoptysis   [] Wheeze  [] COPD   [x] Asthma Neurologic:  [] Dizziness   [] Seizures  [] Blackouts [] History of stroke   [] History of TIA  [] Aphasia   [] Temporary Blindness   [] Weakness or numbness in arm   [] Weakness or numbness in leg Musculoskeletal:   [] Joint swelling   [] Joint pain   [] Low back pain  []  History of Knee Replacement [] Arthritis [] back Surgeries  []  Spinal Stenosis    Hematologic:  [] Easy bruising  [] Easy bleeding   [] Hypercoagulable state   [] Anemic Gastrointestinal:  [] Diarrhea   [] Vomiting  [] Gastroesophageal reflux/heartburn   [] Difficulty swallowing. [] Abdominal pain Genitourinary:  [] Chronic kidney disease   [] Difficult urination  [] Anuric   [] Blood in urine [] Frequent urination  [] Burning with urination   [] Hematuria Skin:  [x] Rashes    [] Ulcers [] Wounds Psychological:  [] History of anxiety   []  History of major depression  []  Memory Difficulties      OBJECTIVE:   Physical Exam  BP 107/66 (BP Location: Right Arm)   Pulse 80   Resp 16   Ht 4\' 10"  (1.473 m)   Wt 121 lb (54.9 kg)   BMI 25.29 kg/m   Gen: WD/WN, NAD Head: [place]/AT, No temporalis wasting.  Ear/Nose/Throat: Hearing  grossly intact, nares w/o erythema or drainage Eyes: PER, EOMI, sclera nonicteric.  Neck: Supple, no masses.  No JVD.  Pulmonary:  Good air movement, no use of accessory muscles.  Cardiac: RRR Vascular:  1+ soft edema bilaterally.  Scattered spider varicosities bilaterally Vessel Right Left  Radial Palpable Palpable  Dorsalis Pedis Palpable Palpable  Posterior Tibial Palpable Palpable   Gastrointestinal: soft, non-distended. No guarding/no peritoneal signs.  Musculoskeletal: M/S 5/5 throughout.  No deformity or atrophy.  Neurologic: Pain and light touch intact in extremities.  Symmetrical.  Speech is fluent. Motor exam as listed above. Psychiatric: Judgment intact, Mood & affect appropriate for pt's clinical situation. Dermatologic: Stasis dermatitis bilaterally. no Ulcers Noted.  No changes consistent with cellulitis. Lymph : No Cervical lymphadenopathy, no lichenification or skin changes of chronic lymphedema.       ASSESSMENT AND PLAN:  1. Varicose veins of leg with swelling, bilateral  Recommend:  The patient has large symptomatic varicose veins that are painful and associated with swelling.  I have had a long discussion with the patient regarding  varicose veins and why they cause symptoms.  Patient will begin wearing graduated compression stockings class 1 on a daily basis, beginning first thing in the morning and removing them in the evening. The patient is instructed specifically not to sleep in the stockings.    The patient  will also begin using over-the-counter analgesics such as Motrin 600 mg po TID to help control the  symptoms.    In addition, behavioral modification including elevation during the day will be initiated.    Pending the results of these changes the  patient will be reevaluated in three months.   An  ultrasound of the venous system will be obtained.   Further plans will be based on the ultrasound results and whether conservative therapies are successful at eliminating the pain and swelling.   - VAS [place] LOWER EXTREMITY VENOUS REFLUX; Future  2. Pure hypercholesterolemia Continue statin as ordered and reviewed, no changes at this time  3. Moderate persistent asthma without complication Continue pulmonary medications and aerosols as already ordered, these medications have been reviewed and there are no changes at this time.     Current Outpatient Medications on File Prior to Visit  Medication Sig Dispense Refill  . ADVAIR DISKUS 250-50 MCG/DOSE AEPB INHALE 1 PUFF INTO THE LUNGS 2 TIMES DAILY 60 each 5  . Calcium Carbonate-Vitamin D 600-400 MG-UNIT per tablet Take 1 tablet by mouth 2 (two) times daily.     . diclofenac sodium (VOLTAREN) 1 % GEL APPLY 2-4 GRAMS TO AFFECTED AREA UP TO 3 TIMES A DAY AS NEEDED 1 Tube 5  . dicyclomine (BENTYL) 10 MG capsule TAKE 1 CAPSULE BY MOUTH 3 TIMES A DAY ASNEEDED FOR SPASMS 120 capsule 5  . doxepin (SINEQUAN) 10 MG capsule Patient is taking 5 at bedtime 450 capsule 1  . fluticasone (FLONASE) 50 MCG/ACT nasal spray USE TWO SPRAYS IN EACH NOSTRIL ONCE A DAY 16 g 5  . furosemide (LASIX) 20 MG tablet TAKE 1 TABLET BY MOUTH DAILY AS NEEDED FOR EDEMA 90 tablet 3  . nystatin (MYCOSTATIN) 100000 UNIT/ML suspension Take 5 mLs (500,000 Units total) by mouth 4 (four) times daily. 60 mL 0  . oxybutynin (DITROPAN XL) 15 MG 24 hr tablet Take 15 mg by mouth at bedtime.     . pentosan polysulfate (ELMIRON) 100 MG capsule Take 200 mg by mouth 2 (two) times daily.     . potassium  chloride (K-DUR) 10 MEQ tablet Take 1 tablet (10 mEq total) by mouth daily. 90 tablet 3  .  Tiotropium Bromide Monohydrate (SPIRIVA RESPIMAT) 1.25 MCG/ACT AERS Inhale 2 puffs into the lungs daily. 4 g 5  . ULTRAM 50 MG tablet TAKE 1 TABLET BY MOUTH EVERY 8 HOURS AS NEEDED FOR PAIN. 90 tablet 5  . urea (CARMOL) 40 % CREA Apply topically.    . VENTOLIN HFA 108 (90 Base) MCG/ACT inhaler INHALE TWO PUFFS INTO THE LUNGS EVERY 4 HOURS AS NEEDED FOR WHEEZING OR SHORTNESS OF BREATH 18 g 5  . verapamil (CALAN-SR) 180 MG CR tablet Take 0.5 tablets (90 mg total) by mouth daily. 45 tablet 1   No current facility-administered medications on file prior to visit.     There are no Patient Instructions on file for this visit. No follow-ups on file.   Kris Hartmann, NP  This note was completed with Sales executive.  Any errors are purely unintentional.

## 2019-03-30 NOTE — Telephone Encounter (Signed)
She may need to come back in so I can listen to her lungs and [place] check her O2 sats. Last check on 03/12/19 they had been normal. But if it is worsening she can come back again.

## 2019-03-31 ENCOUNTER — Encounter: Payer: Self-pay | Admitting: Physician Assistant

## 2019-03-31 ENCOUNTER — Ambulatory Visit (INDEPENDENT_AMBULATORY_CARE_PROVIDER_SITE_OTHER): Payer: Medicare HMO | Admitting: Physician Assistant

## 2019-03-31 ENCOUNTER — Other Ambulatory Visit: Payer: Self-pay

## 2019-03-31 VITALS — BP 108/68 | HR 79 | Temp 99.2°F | Resp 16 | Wt 122.0 lb

## 2019-03-31 DIAGNOSIS — J4541 Moderate persistent asthma with (acute) exacerbation: Secondary | ICD-10-CM

## 2019-03-31 MED ORDER — PREDNISONE 10 MG PO TABS: ORAL_TABLET | ORAL | 0 refills | Status: DC

## 2019-03-31 MED ORDER — AZITHROMYCIN 250 MG PO TABS: ORAL_TABLET | ORAL | 0 refills | Status: DC
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
## 2019-03-31 NOTE — Telephone Encounter (Signed)
Patient was advised. Scheduled appt for today at 1:20 pm.

## 2019-03-31 NOTE — Patient Instructions (Signed)
Bronchospasm, Adult    Bronchospasm is when airways in the lungs get smaller. When this happens, it can be hard to breathe. You may cough. You may also make a whistling sound when you breathe (wheeze).  Follow these instructions at home:  Medicines   Take over-the-counter and prescription medicines only as told by your doctor.   If you need to use an inhaler or nebulizer to take your medicine, ask your doctor how to use it.   If you were given a spacer, always use it with your inhaler.  Lifestyle   Change your heating and air conditioning filter. Do this at least once a month.   Try not to use fireplaces and wood stoves.   Do not  smoke. Do not  allow smoking in your home.   Try not to use things that have a strong smell, like perfume.   Get rid of pests (such as roaches and mice) and their poop.   Remove any mold from your home.   Keep your house clean. Get rid of dust.   Use cleaning products that have no smell.   Replace carpet with wood, tile, or vinyl flooring.   Use allergy-proof pillows, mattress covers, and box spring covers.   Wash bed sheets and blankets every week. Use hot water. Dry them in a dryer.   Use blankets that are made of polyester or cotton.   Wash your hands often.   Keep pets out of your bedroom.   When you exercise, try not to breathe in cold air.  General instructions   Have a plan for getting medical care. Know these things:  ? When to call your doctor.  ? When to call local emergency services (911 in the [place]  ? Where to go in an emergency.   Stay up to date on your shots (immunizations).   When you have an episode:  ? Stay calm.  ? Relax.  ? Breathe slowly.  Contact a doctor if:   Your muscles ache.   Your chest hurts.   The color of the mucus you cough up (sputum) changes from clear or white to yellow, green, gray, or bloody.   The mucus you cough up gets thicker.   You have a fever.  Get help right away if:   The whistling sound gets worse, even after you  take your medicines.   Your coughing gets worse.   You find it even harder to breathe.   Your chest hurts very much.  Summary   Bronchospasm is when airways in the lungs get smaller.   When this happens, it can be hard to breathe. You may cough. You may also make a whistling sound when you breathe.   Stay away from things that cause you to have episodes. These include smoke or dust.  This information is not intended to replace advice given to you by your health care provider. Make sure you discuss any questions you have with your health care provider.  Document Released: 09/29/2009 Document Revised: 12/05/2016 Document Reviewed: 12/05/2016  Elsevier Interactive Patient Education  2019 [employer]

## 2019-04-02 ENCOUNTER — Other Ambulatory Visit: Payer: Self-pay | Admitting: Physician Assistant

## 2019-04-02 DIAGNOSIS — J4530 Mild persistent asthma, uncomplicated: Secondary | ICD-10-CM

## 2019-04-14 ENCOUNTER — Other Ambulatory Visit: Payer: Self-pay | Admitting: *Deleted

## 2019-04-14 DIAGNOSIS — K589 Irritable bowel syndrome without diarrhea: Secondary | ICD-10-CM

## 2019-04-14 DIAGNOSIS — J301 Allergic rhinitis due to pollen: Secondary | ICD-10-CM

## 2019-04-14 MED ORDER — FLUTICASONE PROPIONATE 50 MCG/ACT NA SUSP: NASAL | 5 refills | Status: DC

## 2019-04-14 MED ORDER — DICYCLOMINE HCL 10 MG PO CAPS: ORAL_CAPSULE | ORAL | 5 refills | Status: DC

## 2019-04-14 NOTE — Telephone Encounter (Signed)
L.O.V. 03/31/2019, please advise.

## 2019-05-03 ENCOUNTER — Ambulatory Visit: Payer: Self-pay

## 2019-05-07 ENCOUNTER — Ambulatory Visit (INDEPENDENT_AMBULATORY_CARE_PROVIDER_SITE_OTHER): Payer: Medicare HMO | Admitting: Physician Assistant

## 2019-05-07 ENCOUNTER — Telehealth: Payer: Self-pay

## 2019-05-07 DIAGNOSIS — R110 Nausea: Secondary | ICD-10-CM | POA: Diagnosis not present

## 2019-05-07 DIAGNOSIS — M7989 Other specified soft tissue disorders: Secondary | ICD-10-CM | POA: Diagnosis not present

## 2019-05-07 DIAGNOSIS — I83893 Varicose veins of bilateral lower extremities with other complications: Secondary | ICD-10-CM | POA: Diagnosis not present

## 2019-05-07 NOTE — Telephone Encounter (Signed)
Patient scheduled for a telephone visit today at 3:20pm

## 2019-05-07 NOTE — Telephone Encounter (Signed)
Patient called saying that she is having severe nausea for the last 5 days. She reports that she has vomited twice. She is able to keep solids and liquids down, but feels very queasy after eating. She is requesting that we send in something for nausea. She is a Sports administrator patient. [facility] is her pharmacy. Contact info is correct. Thanks!   She also mentioned about her legs swelling. Patient takes furosemide 20mg  as needed for swelling. Recommended that she try the support hose again and keep legs elevated when she can.  And if symptoms worsen to call back. She verbally understands.

## 2019-05-07 NOTE — Telephone Encounter (Signed)
Schedule for video or telephone visit please.

## 2019-05-07 NOTE — Progress Notes (Signed)
Patient: Jackie Turner Female    DOB: 1936/07/06   83 y.o.   MRN: [id number] Visit Date: 05/07/2019  Today's Provider: Trinna Post, PA-C   Chief Complaint  Patient presents with  . Leg swelling  . Nausea   Subjective:    Virtual Visit via Telephone Note  I connected with Jackie Turner on 05/07/19 at  3:20 PM EDT by telephone and verified that I am speaking with the correct person using two identifiers.  Location: Patient: Home Provider: Office    I discussed the limitations, risks, security and privacy concerns of performing an evaluation and management service by telephone and the availability of in person appointments. I also discussed with the patient that there may be a patient responsible charge related to this service. The patient expressed understanding and agreed to proceed.  HPI   Patient presents today for nausea and lower extremity edema. She reports two weeks ago she had one episode of nausea and vomiting. She took emetrol and vomiting resolved. Then more recently she had an episode of vomiting after eating. She again took emetrol and her symptoms resolved. She denies any vomiting but is having some persistent nausea. She denies weight loss, blood in stool, diarrhea, constipation.   Reports she is having lower extremity edema for several years. She reports she has had worsened edema in the last week. She is taking Lasix 20 mg QD PRN and potassium supplements. She is wearing compression stockings. She has seen vascular surgery on 03/29/2019 for varicose veins. She was instructed to wear compression stockings and return in 3 months for ultrasound of lower extremities.   Allergies  Allergen Reactions  . Nsaids Other (See Comments)    History of gastric ulcer  . Amoxicillin Nausea Only and Rash  . Ibandronic Acid Other (See Comments)    Muscle weakness - advised not to take  **BONIVA** - drug name  . Actonel  [Risedronate] Other (See Comments)     Gastric ulcers  . Antihistamines, Chlorpheniramine-Type   . Aspirin     Gastric ulcer  . Buspar [Buspirone] Other (See Comments)    Pt does not remember reaction   . Butalbital-Aspirin-Caffeine Other (See Comments)    Other reaction(s): Unknown  . Clarithromycin Other (See Comments)    Bloating  *BIAXIN*  . Codeine   . Gatifloxacin Other (See Comments)  . Hydrocodone-Guaifenesin Nausea Only    CODICLEAR DH STYRUP   . Moxifloxacin   . Oxycodone Other (See Comments)    Dizziness  . Penicillins Other (See Comments)  . Risedronate Sodium     Gastric ulcer  . Tylenol With Codeine #3  [Acetaminophen-Codeine]     Other reaction(s): Unknown  . Raloxifene Other (See Comments)    Bloating Bloating  *EVISTA*     Current Outpatient Medications:  .  ADVAIR DISKUS 250-50 MCG/DOSE AEPB, INHALE 1 PUFF INTO THE LUNGS 2 TIMES DAILY, Disp: 60 each, Rfl: 5 .  azithromycin (ZITHROMAX) 250 MG tablet, Take 2 tablets PO on day one, and one tablet PO daily thereafter until completed., Disp: 6 tablet, Rfl: 0 .  Calcium Carbonate-Vitamin D 600-400 MG-UNIT per tablet, Take 1 tablet by mouth 2 (two) times daily. , Disp: , Rfl:  .  diclofenac sodium (VOLTAREN) 1 % GEL, APPLY 2-4 GRAMS TO AFFECTED AREA UP TO 3 TIMES A DAY AS NEEDED, Disp: 1 Tube, Rfl: 5 .  dicyclomine (BENTYL) 10 MG capsule, TAKE 1 CAPSULE BY MOUTH 3 TIMES  A DAY ASNEEDED FOR SPASMS, Disp: 120 capsule, Rfl: 5 .  doxepin (SINEQUAN) 10 MG capsule, Patient is taking 5 at bedtime, Disp: 450 capsule, Rfl: 1 .  fluticasone (FLONASE) 50 MCG/ACT nasal spray, USE TWO SPRAYS IN EACH NOSTRIL ONCE A DAY, Disp: 16 g, Rfl: 5 .  furosemide (LASIX) 20 MG tablet, TAKE 1 TABLET BY MOUTH DAILY AS NEEDED FOR EDEMA (Patient not taking: Reported on 03/31/2019), Disp: 90 tablet, Rfl: 3 .  nystatin (MYCOSTATIN) 100000 UNIT/ML suspension, Take 5 mLs (500,000 Units total) by mouth 4 (four) times daily., Disp: 60 mL, Rfl: 0 .  oxybutynin (DITROPAN XL) 15 MG 24 hr  tablet, Take 15 mg by mouth at bedtime. , Disp: , Rfl:  .  pentosan polysulfate (ELMIRON) 100 MG capsule, Take 200 mg by mouth 2 (two) times daily. , Disp: , Rfl:  .  potassium chloride (K-DUR) 10 MEQ tablet, Take 1 tablet (10 mEq total) by mouth daily. (Patient not taking: Reported on 03/31/2019), Disp: 90 tablet, Rfl: 3 .  predniSONE (DELTASONE) 10 MG tablet, Take 6 tabs PO on day 1&2, 5 tabs PO on day 3&4, 4 tabs PO on day 5&6, 3 tabs PO on day 7&8, 2 tabs PO on day 9&10, 1 tab PO on day 11&12., Disp: 42 tablet, Rfl: 0 .  Tiotropium Bromide Monohydrate (SPIRIVA RESPIMAT) 1.25 MCG/ACT AERS, Inhale 2 puffs into the lungs daily., Disp: 4 g, Rfl: 5 .  ULTRAM 50 MG tablet, TAKE 1 TABLET BY MOUTH EVERY 8 HOURS AS NEEDED FOR PAIN., Disp: 90 tablet, Rfl: 5 .  urea (CARMOL) 40 % CREA, Apply topically., Disp: , Rfl:  .  VENTOLIN HFA 108 (90 Base) MCG/ACT inhaler, INHALE TWO PUFFS INTO THE LUNGS EVERY 4 HOURS AS NEEDED FOR WHEEZING OR SHORTNESS OF BREATH, Disp: 18 g, Rfl: 5 .  verapamil (CALAN-SR) 180 MG CR tablet, Take 0.5 tablets (90 mg total) by mouth daily., Disp: 45 tablet, Rfl: 1  Review of Systems  Constitutional: Negative.   Respiratory: Negative.   Cardiovascular: Positive for leg swelling.  Gastrointestinal: Positive for nausea.  Musculoskeletal: Negative.     Social History   Tobacco Use  . Smoking status: Never Smoker  . Smokeless tobacco: Never Used  Substance Use Topics  . Alcohol use: No      Objective:   There were no vitals taken for this visit. There were no vitals filed for this visit.   Physical Exam      Assessment & Plan    1. Nausea  Seems limited and has resolved.  2. Varicose veins of leg with swelling, bilateral  Reviewed vascular surgery note. Advised on compression stockings and f/u with vascular.   3. Swelling of limb  The entirety of the information documented in the History of Present Illness, Review of Systems and Physical Exam were personally  obtained by me. Portions of this information were initially documented by Jennings Books, CMA and reviewed by me for thoroughness and accuracy.   F/u PRN  The entirety of the information documented in the History of Present Illness, Review of Systems and Physical Exam were personally obtained by me. Portions of this information were initially documented by Jennings Books, CMA and reviewed by me for thoroughness and accuracy.          Trinna Post, PA-C  [facility]

## 2019-05-12 ENCOUNTER — Other Ambulatory Visit: Payer: Self-pay

## 2019-05-12 ENCOUNTER — Ambulatory Visit: Payer: Medicare HMO | Admitting: Physician Assistant

## 2019-05-12 ENCOUNTER — Encounter: Payer: Self-pay | Admitting: Physician Assistant

## 2019-05-12 VITALS — BP 119/69 | HR 89 | Temp 99.2°F | Wt 117.0 lb

## 2019-05-12 DIAGNOSIS — K1379 Other lesions of oral mucosa: Secondary | ICD-10-CM

## 2019-05-12 DIAGNOSIS — H938X2 Other specified disorders of left ear: Secondary | ICD-10-CM | POA: Diagnosis not present

## 2019-05-12 NOTE — Patient Instructions (Signed)
Varicose Veins Varicose veins are veins that have become enlarged, bulged, and twisted. They most often appear in the legs. What are the causes? This condition is caused by damage to the valves in the vein. These valves help blood return to your heart. When they are damaged and they stop working properly, blood may flow backward and back up in the veins near the skin, causing the veins to get larger and appear twisted. The condition can result from any issue that causes blood to back up, like pregnancy, prolonged standing, or obesity. What increases the risk? This condition is more likely to develop in people who are:  On their feet a lot.  Pregnant.  Overweight. What are the signs or symptoms? Symptoms of this condition include:  Bulging, twisted, and bluish veins.  A feeling of heaviness. This may be worse at the end of the day.  Leg pain. This may be worse at the end of the day.  Swelling in the leg.  Changes in skin color over the veins. How is this diagnosed? This condition may be diagnosed based on your symptoms, a physical exam, and an ultrasound test. How is this treated? Treatment for this condition may involve:  Avoiding sitting or standing in one position for long periods of time.  Wearing compression stockings. These stockings help to prevent blood clots and reduce swelling in the legs.  Raising (elevating) the legs when resting.  Losing weight.  Exercising regularly. If you have persistent symptoms or want to improve the way your varicose veins look, you may choose to have a procedure to close the varicose veins off or to remove them. Treatments to close off the veins include:  Sclerotherapy. In this treatment, a solution is injected into a vein to close it off.  Laser treatment. In this treatment, the vein is heated with a laser to close it off.  Radiofrequency vein ablation. In this treatment, an electrical current produced by radio waves is used to close  off the vein. Treatments to remove the veins include:  Phlebectomy. In this treatment, the veins are removed through small incisions made over the veins.  Vein ligation and stripping. In this treatment, incisions are made over the veins. The veins are then removed after being tied (ligated) with stitches (sutures). Follow these instructions at home: Activity  Walk as much as possible. Walking increases blood flow. This helps blood return to the heart and takes pressure off your veins. It also increases your cardiovascular strength.  Follow your health care provider's instructions about exercising.  Do not stand or sit in one position for a long period of time.  Do not sit with your legs crossed.  Rest with your legs raised during the day. General instructions   Follow any diet instructions given to you by your health care provider.  Wear compression stockings as directed by your health care provider. Do not wear other kinds of tight clothing around your legs, pelvis, or waist.  Elevate your legs at night to above the level of your heart.  If you get a cut in the skin over the varicose vein and the vein bleeds: ? Lie down with your leg raised. ? Apply firm pressure to the cut with a clean cloth until the bleeding stops. ? Place a bandage (dressing) on the cut. Contact a health care provider if:  The skin around your varicose veins starts to break down.  You have pain, redness, tenderness, or hard swelling over a vein.  You   are uncomfortable because of pain.  You get a cut in the skin over a varicose vein and it will not stop bleeding. Summary  Varicose veins are veins that have become enlarged, bulged, and twisted. They most often appear in the legs.  This condition is caused by damage to the valves in the vein. These valves help blood return to your heart.  Treatment for this condition includes frequent movements, wearing compression stockings, losing weight, and  exercising regularly. In some cases, procedures are done to close off or remove the veins.  Treatment for this condition may include wearing compression stockings, elevating the legs, losing weight, and engaging in regular activity. In some cases, procedures are done to close off or remove the veins. This information is not intended to replace advice given to you by your health care provider. Make sure you discuss any questions you have with your health care provider. Document Released: 09/11/2005 Document Revised: 12/25/2016 Document Reviewed: 12/25/2016 Elsevier Interactive Patient Education  2019 [employer]  

## 2019-05-12 NOTE — Patient Instructions (Signed)
Use salt water gargles for mouth soreness. Use lotion after bathing.   Vertigo  Vertigo means that you feel like you are moving when you are not. Vertigo can also make you feel like things around you are moving when they are not. This feeling can come and go at any time. Vertigo often goes away on its own. Follow these instructions at home:  Avoid making fast movements.  Avoid driving.  Avoid using heavy machinery.  Avoid doing any task or activity that might cause danger to you or other people if you would have a vertigo attack while you are doing it.  Sit down right away if you feel dizzy or have trouble with your balance.  Take over-the-counter and prescription medicines only as told by your doctor.  Follow instructions from your doctor about which positions or movements you should avoid.  Drink enough fluid to keep your pee (urine) clear or pale yellow.  Keep all follow-up visits as told by your doctor. This is important. Contact a doctor if:  Medicine does not help your vertigo.  You have a fever.  Your problems get worse or you have new symptoms.  Your family or friends see changes in your behavior.  You feel sick to your stomach (nauseous) or you throw up (vomit).  You have a "pins and needles" feeling or you are numb in part of your body. Get help right away if:  You have trouble moving or talking.  You are always dizzy.  You pass out (faint).  You get very bad headaches.  You feel weak or have trouble using your hands, arms, or legs.  You have changes in your hearing.  You have changes in your seeing (vision).  You get a stiff neck.  Bright light starts to bother you. This information is not intended to replace advice given to you by your health care provider. Make sure you discuss any questions you have with your health care provider. Document Released: 09/10/2008 Document Revised: 05/09/2016 Document Reviewed: 03/27/2015 Elsevier Interactive  Patient Education  [employer].

## 2019-05-12 NOTE — Progress Notes (Signed)
Patient: Jackie Turner Female    DOB: 1936-04-07   83 y.o.   MRN: [id number] Visit Date: 05/12/2019  Today's Provider: Trinna Post, PA-C   Chief Complaint  Patient presents with  . Ear Problem   Subjective:    Patient with history history of inner ear issues presenting today with left ear crackling.  Otalgia   There is pain in the left ear. This is a new problem. The current episode started 1 to 4 weeks ago. The problem occurs constantly. The problem has been unchanged. There has been no fever. Associated symptoms include hearing loss, a rash and vomiting. Pertinent negatives include no abdominal pain, coughing, diarrhea, ear discharge, rhinorrhea or sore throat. inner ear   Mouth soreness - using salt water rinses since yesterday.   Soreness in back after bath. Hard time putting lotion there.  Wt Readings from Last 3 Encounters:  05/12/19 117 lb (53.1 kg)  03/31/19 122 lb (55.3 kg)  03/29/19 121 lb (54.9 kg)    Allergies  Allergen Reactions  . Nsaids Other (See Comments)    History of gastric ulcer  . Amoxicillin Nausea Only and Rash  . Ibandronic Acid Other (See Comments)    Muscle weakness - advised not to take  **BONIVA** - drug name  . Actonel  [Risedronate] Other (See Comments)    Gastric ulcers  . Antihistamines, Chlorpheniramine-Type   . Aspirin     Gastric ulcer  . Buspar [Buspirone] Other (See Comments)    Pt does not remember reaction   . Butalbital-Aspirin-Caffeine Other (See Comments)    Other reaction(s): Unknown  . Clarithromycin Other (See Comments)    Bloating  *BIAXIN*  . Codeine   . Gatifloxacin Other (See Comments)  . Hydrocodone-Guaifenesin Nausea Only    CODICLEAR DH STYRUP   . Moxifloxacin   . Oxycodone Other (See Comments)    Dizziness  . Penicillins Other (See Comments)  . Risedronate Sodium     Gastric ulcer  . Tylenol With Codeine #3  [Acetaminophen-Codeine]     Other reaction(s): Unknown  . Raloxifene Other  (See Comments)    Bloating Bloating  *EVISTA*     Current Outpatient Medications:  .  ADVAIR DISKUS 250-50 MCG/DOSE AEPB, INHALE 1 PUFF INTO THE LUNGS 2 TIMES DAILY, Disp: 60 each, Rfl: 5 .  Calcium Carbonate-Vitamin D 600-400 MG-UNIT per tablet, Take 1 tablet by mouth 2 (two) times daily. , Disp: , Rfl:  .  diclofenac sodium (VOLTAREN) 1 % GEL, APPLY 2-4 GRAMS TO AFFECTED AREA UP TO 3 TIMES A DAY AS NEEDED, Disp: 1 Tube, Rfl: 5 .  dicyclomine (BENTYL) 10 MG capsule, TAKE 1 CAPSULE BY MOUTH 3 TIMES A DAY ASNEEDED FOR SPASMS, Disp: 120 capsule, Rfl: 5 .  doxepin (SINEQUAN) 10 MG capsule, Patient is taking 5 at bedtime, Disp: 450 capsule, Rfl: 1 .  fluticasone (FLONASE) 50 MCG/ACT nasal spray, USE TWO SPRAYS IN EACH NOSTRIL ONCE A DAY, Disp: 16 g, Rfl: 5 .  furosemide (LASIX) 20 MG tablet, TAKE 1 TABLET BY MOUTH DAILY AS NEEDED FOR EDEMA, Disp: 90 tablet, Rfl: 3 .  nystatin (MYCOSTATIN) 100000 UNIT/ML suspension, Take 5 mLs (500,000 Units total) by mouth 4 (four) times daily., Disp: 60 mL, Rfl: 0 .  oxybutynin (DITROPAN XL) 15 MG 24 hr tablet, Take 15 mg by mouth at bedtime. , Disp: , Rfl:  .  pentosan polysulfate (ELMIRON) 100 MG capsule, Take 200 mg by mouth 2 (two)  times daily. , Disp: , Rfl:  .  potassium chloride (K-DUR) 10 MEQ tablet, Take 1 tablet (10 mEq total) by mouth daily., Disp: 90 tablet, Rfl: 3 .  Tiotropium Bromide Monohydrate (SPIRIVA RESPIMAT) 1.25 MCG/ACT AERS, Inhale 2 puffs into the lungs daily., Disp: 4 g, Rfl: 5 .  ULTRAM 50 MG tablet, TAKE 1 TABLET BY MOUTH EVERY 8 HOURS AS NEEDED FOR PAIN., Disp: 90 tablet, Rfl: 5 .  urea (CARMOL) 40 % CREA, Apply topically., Disp: , Rfl:  .  VENTOLIN HFA 108 (90 Base) MCG/ACT inhaler, INHALE TWO PUFFS INTO THE LUNGS EVERY 4 HOURS AS NEEDED FOR WHEEZING OR SHORTNESS OF BREATH, Disp: 18 g, Rfl: 5 .  verapamil (CALAN-SR) 180 MG CR tablet, Take 0.5 tablets (90 mg total) by mouth daily., Disp: 45 tablet, Rfl: 1  Review of Systems    Constitutional: Positive for appetite change (patient reports for over 2 weeks).  HENT: Positive for ear pain and hearing loss. Negative for ear discharge, mouth sores, nosebleeds, postnasal drip, rhinorrhea, sinus pressure, sinus pain, sneezing, sore throat, tinnitus, trouble swallowing and voice change.        Patient reports that her tongue and inner lips are very sore to the touch and is more noticeable when eating.   Respiratory: Negative for cough.   Gastrointestinal: Positive for nausea and vomiting. Negative for abdominal pain and diarrhea.  Musculoskeletal: Gait problem: patient states that back is very itchy.  Skin: Positive for rash.  Neurological: Positive for dizziness.    Social History   Tobacco Use  . Smoking status: Never Smoker  . Smokeless tobacco: Never Used  Substance Use Topics  . Alcohol use: No      Objective:   BP 119/69   Pulse 89   Temp 99.2 F (37.3 C) (Oral)   Wt 117 lb (53.1 kg)   BMI 24.45 kg/m  Vitals:   05/12/19 1437  BP: 119/69  Pulse: 89  Temp: 99.2 F (37.3 C)  TempSrc: Oral  Weight: 117 lb (53.1 kg)     Physical Exam HENT:     Right Ear: Tympanic membrane and ear canal normal.     Left Ear: Ear canal normal.     Ears:     Comments: Left TM opaque.  Skin:    General: Skin is warm and dry.     Findings: No rash.  Neurological:     Mental Status: She is oriented to person, place, and time. Mental status is at baseline.  Psychiatric:        Mood and Affect: Mood normal.        Behavior: Behavior normal.         Assessment & Plan    1. Ear popping, left  Recommend daily 2nd generation antihistamine.  2. Mouth pain  Can try salt water rinses.  The entirety of the information documented in the History of Present Illness, Review of Systems and Physical Exam were personally obtained by me. Portions of this information were initially documented by Jennings Books, CMA and reviewed by me for thoroughness and accuracy.    F/u PRN.        Trinna Post, PA-C  [facility] Patient seen and examined by Carles Collet PA-C, note scribed by Jennings Books, Nara Visa.

## 2019-05-14 ENCOUNTER — Other Ambulatory Visit: Payer: Self-pay | Admitting: Physician Assistant

## 2019-05-14 ENCOUNTER — Telehealth (INDEPENDENT_AMBULATORY_CARE_PROVIDER_SITE_OTHER): Payer: Self-pay | Admitting: Nurse Practitioner

## 2019-05-14 DIAGNOSIS — I10 Essential (primary) hypertension: Secondary | ICD-10-CM

## 2019-05-14 NOTE — Telephone Encounter (Signed)
Patient is having bilateral ankles and feet swelling,redness,pain,and heat sensation which started yesterday,She is wearing her compression stockings and elevating.

## 2019-05-14 NOTE — Telephone Encounter (Signed)
Patient has a appointment on June 1,2020 at 10:30 with Dr Delana Meyer.

## 2019-05-14 NOTE — Telephone Encounter (Signed)
Unfortunately there isn't a pill that generally helps with decreasing swelling.  The best intervention is wearing compression socks, elevating, exercise and taking tylenol or ibuprofen for pain relief.  We can bring her in earlier for her ultrasound.  This can be done sometime next week, also can we get her in with one of the Docs because she hasn't seen either one of them yet. Thanks.

## 2019-05-14 NOTE — Telephone Encounter (Signed)
Move up July appointment per provider

## 2019-05-17 ENCOUNTER — Ambulatory Visit (INDEPENDENT_AMBULATORY_CARE_PROVIDER_SITE_OTHER): Payer: Medicare HMO | Admitting: Vascular Surgery

## 2019-05-17 ENCOUNTER — Ambulatory Visit (INDEPENDENT_AMBULATORY_CARE_PROVIDER_SITE_OTHER): Payer: Medicare HMO

## 2019-05-17 ENCOUNTER — Other Ambulatory Visit: Payer: Self-pay

## 2019-05-17 ENCOUNTER — Encounter (INDEPENDENT_AMBULATORY_CARE_PROVIDER_SITE_OTHER): Payer: Self-pay | Admitting: Vascular Surgery

## 2019-05-17 VITALS — BP 120/66 | HR 81 | Resp 16 | Wt 118.0 lb

## 2019-05-17 DIAGNOSIS — I872 Venous insufficiency (chronic) (peripheral): Secondary | ICD-10-CM | POA: Diagnosis not present

## 2019-05-17 DIAGNOSIS — E7800 Pure hypercholesterolemia, unspecified: Secondary | ICD-10-CM

## 2019-05-17 DIAGNOSIS — M810 Age-related osteoporosis without current pathological fracture: Secondary | ICD-10-CM | POA: Diagnosis not present

## 2019-05-17 DIAGNOSIS — Z79899 Other long term (current) drug therapy: Secondary | ICD-10-CM

## 2019-05-17 DIAGNOSIS — I83893 Varicose veins of bilateral lower extremities with other complications: Secondary | ICD-10-CM | POA: Diagnosis not present

## 2019-05-17 DIAGNOSIS — I890 Lymphedema, not elsewhere classified: Secondary | ICD-10-CM

## 2019-05-17 DIAGNOSIS — J4540 Moderate persistent asthma, uncomplicated: Secondary | ICD-10-CM

## 2019-05-17 DIAGNOSIS — Z8781 Personal history of (healed) traumatic fracture: Secondary | ICD-10-CM | POA: Diagnosis not present

## 2019-05-17 NOTE — Progress Notes (Signed)
MRN : [id number]  Jackie Turner is a 83 y.o. (1936-07-09) female who presents with chief complaint of  Chief Complaint  Patient presents with  . Follow-up    ultrasound follow up  .  History of Present Illness:   The patient returns to the office for followup evaluation regarding leg swelling.  The swelling has improved quite a bit and the pain associated with swelling has decreased substantially. There have not been any interval development of a ulcerations or wounds.  Since the previous visit the patient has been wearing graduated compression stockings and has noted little significant improvement in the lymphedema. The patient has been using compression routinely morning until night.  The lymph pump did not work well for her and she sent it back  The patient also states elevation during the day and exercise is being done too.  Duplex ultrasound of the venous system shows deep venous reflux no superficial reflux   Current Meds  Medication Sig  . ADVAIR DISKUS 250-50 MCG/DOSE AEPB INHALE 1 PUFF INTO THE LUNGS 2 TIMES DAILY  . Calcium Carbonate-Vitamin D 600-400 MG-UNIT per tablet Take 1 tablet by mouth 2 (two) times daily.   . diclofenac sodium (VOLTAREN) 1 % GEL APPLY 2-4 GRAMS TO AFFECTED AREA UP TO 3 TIMES A DAY AS NEEDED  . dicyclomine (BENTYL) 10 MG capsule TAKE 1 CAPSULE BY MOUTH 3 TIMES A DAY ASNEEDED FOR SPASMS  . doxepin (SINEQUAN) 10 MG capsule Patient is taking 5 at bedtime  . fluticasone (FLONASE) 50 MCG/ACT nasal spray USE TWO SPRAYS IN EACH NOSTRIL ONCE A DAY  . furosemide (LASIX) 20 MG tablet TAKE 1 TABLET BY MOUTH DAILY AS NEEDED FOR EDEMA  . nystatin (MYCOSTATIN) 100000 UNIT/ML suspension Take 5 mLs (500,000 Units total) by mouth 4 (four) times daily.  Marland Kitchen oxybutynin (DITROPAN XL) 15 MG 24 hr tablet Take 15 mg by mouth at bedtime.   . pentosan polysulfate (ELMIRON) 100 MG capsule Take 200 mg by mouth 2 (two) times daily.   . potassium chloride (K-DUR) 10 MEQ  tablet Take 1 tablet (10 mEq total) by mouth daily.  . Tiotropium Bromide Monohydrate (SPIRIVA RESPIMAT) 1.25 MCG/ACT AERS Inhale 2 puffs into the lungs daily.  Marland Kitchen ULTRAM 50 MG tablet TAKE 1 TABLET BY MOUTH EVERY 8 HOURS AS NEEDED FOR PAIN.  . urea (CARMOL) 40 % CREA Apply topically.  . VENTOLIN HFA 108 (90 Base) MCG/ACT inhaler INHALE TWO PUFFS INTO THE LUNGS EVERY 4 HOURS AS NEEDED FOR WHEEZING OR SHORTNESS OF BREATH  . verapamil (CALAN-SR) 180 MG CR tablet TAKE 1/2 TABLET (90 mg TOTAL) BY MOUTH DAILY.    Past Medical History:  Diagnosis Date  . Allergy   . Asthma   . Hyperlipidemia   . Osteoporosis     Past Surgical History:  Procedure Laterality Date  . ABDOMINAL HYSTERECTOMY  2003  . BREAST BIOPSY  1976, 1978   benign  . carpal tunnel repair Bilateral    R 1990; L 1997  . EYE SURGERY Bilateral    cataract extraction  . HAND TENDON SURGERY Right    Trigger finger  . IR RADIOLOGIST EVAL & MGMT  06/13/2017  . IR VERTEBROPLASTY CERV/THOR BX INC UNI/BIL INC/INJECT/IMAGING  06/17/2017  . TONSILLECTOMY  1961    Social History Social History   Tobacco Use  . Smoking status: Never Smoker  . Smokeless tobacco: Never Used  Substance Use Topics  . Alcohol use: No  . Drug use: No  Family History Family History  Problem Relation Age of Onset  . Heart disease Mother   . CAD Mother   . Congestive Heart Failure Mother   . Osteoarthritis Mother   . Cancer Sister        breast  . Breast cancer Sister     Allergies  Allergen Reactions  . Nsaids Other (See Comments)    History of gastric ulcer  . Amoxicillin Nausea Only and Rash  . Ibandronic Acid Other (See Comments)    Muscle weakness - advised not to take  **BONIVA** - drug name  . Actonel  [Risedronate] Other (See Comments)    Gastric ulcers  . Antihistamines, Chlorpheniramine-Type   . Aspirin     Gastric ulcer  . Buspar [Buspirone] Other (See Comments)    Pt does not remember reaction   .  Butalbital-Aspirin-Caffeine Other (See Comments)    Other reaction(s): Unknown  . Clarithromycin Other (See Comments)    Bloating  *BIAXIN*  . Codeine   . Gatifloxacin Other (See Comments)  . Hydrocodone-Guaifenesin Nausea Only    CODICLEAR DH STYRUP   . Moxifloxacin   . Oxycodone Other (See Comments)    Dizziness  . Penicillins Other (See Comments)  . Risedronate Sodium     Gastric ulcer  . Tylenol With Codeine #3  [Acetaminophen-Codeine]     Other reaction(s): Unknown  . Raloxifene Other (See Comments)    Bloating Bloating  *EVISTA*     REVIEW OF SYSTEMS (Negative unless checked)  Constitutional: [] Weight loss  [] Fever  [] Chills Cardiac: [] Chest pain   [] Chest pressure   [] Palpitations   [] Shortness of breath when laying flat   [] Shortness of breath with exertion. Vascular:  [] Pain in legs with walking   [x] Pain in legs at rest  [] History of DVT   [] Phlebitis   [x] Swelling in legs   [] Varicose veins   [] Non-healing ulcers Pulmonary:   [] Uses home oxygen   [] Productive cough   [] Hemoptysis   [] Wheeze  [] COPD   [x] Asthma Neurologic:  [] Dizziness   [] Seizures   [] History of stroke   [] History of TIA  [] Aphasia   [] Vissual changes   [] Weakness or numbness in arm   [] Weakness or numbness in leg Musculoskeletal:   [] Joint swelling   [] Joint pain   [] Low back pain Hematologic:  [] Easy bruising  [] Easy bleeding   [] Hypercoagulable state   [] Anemic Gastrointestinal:  [] Diarrhea   [] Vomiting  [] Gastroesophageal reflux/heartburn   [] Difficulty swallowing. Genitourinary:  [] Chronic kidney disease   [] Difficult urination  [] Frequent urination   [] Blood in urine Skin:  [] Rashes   [] Ulcers  Psychological:  [] History of anxiety   []  History of major depression.  Physical Examination  Vitals:   05/17/19 1120  BP: 120/66  Pulse: 81  Resp: 16  Weight: 118 lb (53.5 kg)   Body mass index is 24.66 kg/m. Gen: WD/WN, NAD Head: [place]/AT, No temporalis wasting.  Ear/Nose/Throat: Hearing  grossly intact, nares w/o erythema or drainage Eyes: PER, EOMI, sclera nonicteric.  Neck: Supple, no large masses.   Pulmonary:  Good air movement, no audible wheezing bilaterally, no use of accessory muscles.  Cardiac: RRR, no JVD Vascular: scattered small varicosities present bilaterally.  Mild venous stasis changes to the legs bilaterally.  3+ soft pitting edema especially on the dorsum of the feet Vessel Right Left  Radial Palpable Palpable  PT Palpable Palpable  DP Palpable Palpable  Gastrointestinal: Non-distended. No guarding/no peritoneal signs.  Musculoskeletal: M/S 5/5 throughout.  No deformity or  atrophy.  Neurologic: CN 2-12 intact. Symmetrical.  Speech is fluent. Motor exam as listed above. Psychiatric: Judgment intact, Mood & affect appropriate for pt's clinical situation. Dermatologic: mild venous rashes no ulcers noted.  No changes consistent with cellulitis. Lymph : No lichenification or skin changes of chronic lymphedema.  CBC Lab Results  Component Value Date   WBC 7.3 05/21/2018   HGB 14.7 05/21/2018   HCT 43.8 05/21/2018   MCV 87 05/21/2018   PLT 299 05/21/2018    BMET    Component Value Date/Time   NA 143 05/21/2018 0931   K 5.2 06/19/2018 1427   CL 99 05/21/2018 0931   CO2 29 05/21/2018 0931   GLUCOSE 117 (H) 05/21/2018 0931   GLUCOSE 125 (H) 06/17/2017 0824   BUN 19 05/21/2018 0931   CREATININE 0.75 05/21/2018 0931   CALCIUM 9.8 05/21/2018 0931   GFRNONAA 76 05/21/2018 0931   GFRAA 87 05/21/2018 0931   CrCl cannot be calculated (Patient's most recent lab result is older than the maximum 21 days allowed.).  COAG Lab Results  Component Value Date   INR 1.00 06/17/2017    Radiology No results found.   Assessment/Plan 1. Lymphedema Recommend:  No surgery or intervention at this point in time.    I have reviewed my previous discussion with the patient regarding swelling and why it causes symptoms.  Patient will continue wearing graduated  compression stockings class 1 (20-30 mmHg) on a daily basis. The patient will  beginning wearing the stockings first thing in the morning and removing them in the evening. The patient is instructed specifically not to sleep in the stockings.    In addition, behavioral modification including several periods of elevation of the lower extremities during the day will be continued.  This was reviewed with the patient during the initial visit.  The patient will also continue routine exercise, especially walking on a daily basis as was discussed during the initial visit.    Despite conservative treatments including graduated compression therapy class 1 and behavioral modification including exercise and elevation the patient  has not obtained adequate control of the lymphedema.  The patient still has stage 3 lymphedema and therefore, I believe that a lymph pump should be added to improve the control of the patient's lymphedema.  Additionally, a lymph pump is warranted because it will reduce the risk of cellulitis and ulceration in the future.  Patient should follow-up PRN    2. Chronic venous insufficiency Recommend:  No surgery or intervention at this point in time.    I have reviewed my previous discussion with the patient regarding swelling and why it causes symptoms.  Patient will continue wearing graduated compression stockings class 1 (20-30 mmHg) on a daily basis. The patient will  beginning wearing the stockings first thing in the morning and removing them in the evening. The patient is instructed specifically not to sleep in the stockings.    In addition, behavioral modification including several periods of elevation of the lower extremities during the day will be continued.  This was reviewed with the patient during the initial visit.  The patient will also continue routine exercise, especially walking on a daily basis as was discussed during the initial visit.    Despite conservative  treatments including graduated compression therapy class 1 and behavioral modification including exercise and elevation the patient  has not obtained adequate control of the lymphedema.  The patient still has stage 3 lymphedema and therefore, I believe that a  lymph pump should be added to improve the control of the patient's lymphedema.  Additionally, a lymph pump is warranted because it will reduce the risk of cellulitis and ulceration in the future.  Patient should follow-up PRN  3. Moderate persistent asthma, unspecified whether complicated Continue pulmonary medications and aerosols as already ordered, these medications have been reviewed and there are no changes at this time.    4. Pure hypercholesterolemia Continue statin as ordered and reviewed, no changes at this time     Hortencia Pilar, MD  05/17/2019 11:24 AM

## 2019-05-31 DIAGNOSIS — I890 Lymphedema, not elsewhere classified: Secondary | ICD-10-CM | POA: Diagnosis not present

## 2019-06-10 ENCOUNTER — Telehealth: Payer: Self-pay

## 2019-06-10 DIAGNOSIS — J0140 Acute pansinusitis, unspecified: Secondary | ICD-10-CM

## 2019-06-10 NOTE — Telephone Encounter (Signed)
Patient called requesting an appointment. She has a collection of symptoms: sinus headaches, fatigue and low energy along with shortness of breath. Symptoms have been ongoing for several weeks and seems to be worsening.  Patient denies any body aches, chills or fever. She would like to be seen in the office. Please advise.

## 2019-06-10 NOTE — Telephone Encounter (Signed)
She has been isolating due to her risk factors. Can we just confirm that she has not been out or traveled (even locally). If not, she can come in.

## 2019-06-11 MED ORDER — AZITHROMYCIN 250 MG PO TABS: ORAL_TABLET | ORAL | 0 refills | Status: DC

## 2019-06-11 MED ORDER — PREDNISONE 10 MG PO TABS: ORAL_TABLET | ORAL | 0 refills | Status: DC

## 2019-06-11 NOTE — Addendum Note (Signed)
Addended by: Mar Daring on: 06/11/2019 11:44 AM   Modules accepted: Orders

## 2019-06-11 NOTE — Telephone Encounter (Signed)
Patient states she has been out locally to the grocery store. Patient advised she needs a e-visit or telephone visit. No available appointments today. She states she has been using inhalers for SOB with no relief.

## 2019-06-11 NOTE — Telephone Encounter (Signed)
Zpak and prednisone sent in for her

## 2019-06-11 NOTE — Telephone Encounter (Signed)
Patient advised.

## 2019-06-17 ENCOUNTER — Telehealth: Payer: Self-pay | Admitting: Physician Assistant

## 2019-06-17 DIAGNOSIS — B370 Candidal stomatitis: Secondary | ICD-10-CM

## 2019-06-17 MED ORDER — NYSTATIN 100000 UNIT/ML MT SUSP: 5.0000 mL | Freq: Four times a day (QID) | OROMUCOSAL | 0 refills | Status: DC

## 2019-06-17 NOTE — Telephone Encounter (Signed)
Sent!

## 2019-06-17 NOTE — Telephone Encounter (Signed)
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 Please review. Thanks.

## 2019-06-17 NOTE — Telephone Encounter (Signed)
Pt would like a prescription for nystatin.  She os taking prednisone and she said her mouth is broke out inside  [employer]  CB#  [phone]  thanks Con Memos

## 2019-06-22 DIAGNOSIS — M5416 Radiculopathy, lumbar region: Secondary | ICD-10-CM | POA: Diagnosis not present

## 2019-06-22 DIAGNOSIS — M5386 Other specified dorsopathies, lumbar region: Secondary | ICD-10-CM | POA: Diagnosis not present

## 2019-06-22 DIAGNOSIS — M48061 Spinal stenosis, lumbar region without neurogenic claudication: Secondary | ICD-10-CM | POA: Diagnosis not present

## 2019-06-29 ENCOUNTER — Ambulatory Visit (INDEPENDENT_AMBULATORY_CARE_PROVIDER_SITE_OTHER): Payer: Medicare HMO | Admitting: Nurse Practitioner

## 2019-06-29 ENCOUNTER — Encounter (INDEPENDENT_AMBULATORY_CARE_PROVIDER_SITE_OTHER): Payer: Medicare HMO

## 2019-07-12 ENCOUNTER — Other Ambulatory Visit: Payer: Self-pay

## 2019-07-12 ENCOUNTER — Other Ambulatory Visit: Payer: Self-pay | Admitting: Physician Assistant

## 2019-07-12 DIAGNOSIS — S22000D Wedge compression fracture of unspecified thoracic vertebra, subsequent encounter for fracture with routine healing: Secondary | ICD-10-CM

## 2019-07-12 DIAGNOSIS — J4540 Moderate persistent asthma, uncomplicated: Secondary | ICD-10-CM

## 2019-07-12 MED ORDER — ADVAIR DISKUS 250-50 MCG/DOSE IN AEPB: INHALATION_SPRAY | RESPIRATORY_TRACT | 5 refills | Status: DC

## 2019-07-12 MED ORDER — ULTRAM 50 MG PO TABS: 50.0000 mg | ORAL_TABLET | Freq: Three times a day (TID) | ORAL | 5 refills | Status: DC | PRN

## 2019-07-12 NOTE — Telephone Encounter (Signed)
Pt contacted office for refill request on the following medications:  ULTRAM 50 MG tablet  [place]: 05/12/2019 with [place] Please advise. Thanks TNP

## 2019-07-21 DIAGNOSIS — M810 Age-related osteoporosis without current pathological fracture: Secondary | ICD-10-CM | POA: Diagnosis not present

## 2019-08-05 DIAGNOSIS — Z1231 Encounter for screening mammogram for malignant neoplasm of breast: Secondary | ICD-10-CM | POA: Diagnosis not present

## 2019-08-05 LAB — HM MAMMOGRAPHY

## 2019-08-18 ENCOUNTER — Encounter: Payer: Self-pay | Admitting: Physician Assistant

## 2019-09-16 ENCOUNTER — Other Ambulatory Visit: Payer: Self-pay | Admitting: Physician Assistant

## 2019-09-16 DIAGNOSIS — K580 Irritable bowel syndrome with diarrhea: Secondary | ICD-10-CM

## 2019-09-16 DIAGNOSIS — F5101 Primary insomnia: Secondary | ICD-10-CM

## 2019-09-27 NOTE — Progress Notes (Signed)
Subjective:   Jackie Turner is a 83 y.o. female who presents for Medicare Annual (Subsequent) preventive examination.    This visit is being conducted through telemedicine due to the COVID-19 pandemic. This patient has given me verbal consent via doximity to conduct this visit, patient states they are participating from their home address. Some vital signs may be absent or patient reported.    Patient identification: identified by name, DOB, and current address  Review of Systems:  N/A  Cardiac Risk Factors include: advanced age (>32men, >63 women);dyslipidemia     Objective:     Vitals: There were no vitals taken for this visit.  There is no height or weight on file to calculate BMI. Unable to obtain vitals due to visit being conducted via telephonically.   Advanced Directives 09/28/2019 05/01/2018 06/17/2017 04/24/2017 04/16/2017 04/15/2017 04/22/2016  Does Patient Have a Medical Advance Directive? Yes No No No No No No  Type of Paramedic of [place];Living will - - - - - -  Copy of [place] in Chart? No - copy requested - - - - - -  Would patient like information on creating a medical advance directive? - No - Patient declined No - Patient declined No - Patient declined No - Patient declined - No - patient declined information    Tobacco Social History   Tobacco Use  Smoking Status Never Smoker  Smokeless Tobacco Never Used     Counseling given: Not Answered   Clinical Intake:  Pre-visit preparation completed: Yes  Pain : No/denies pain Pain Score: 0-No pain     Nutritional Risks: Nausea/ vomitting/ diarrhea(Has had nausea the last couple of nights. Unsure of cause, will f/u with PCP on 10/01/19.) Diabetes: No  How often do you need to have someone help you when you read instructions, pamphlets, or other written materials from your doctor or pharmacy?: 1 - Never  Interpreter Needed?: No  Information entered by ::  [facility], LPN  Past Medical History:  Diagnosis Date  . Allergy   . Asthma   . Hyperlipidemia   . Osteoporosis    Past Surgical History:  Procedure Laterality Date  . ABDOMINAL HYSTERECTOMY  2003  . BREAST BIOPSY  1976, 1978   benign  . carpal tunnel repair Bilateral    R 1990; L 1997  . EYE SURGERY Bilateral    cataract extraction  . HAND TENDON SURGERY Right    Trigger finger  . IR RADIOLOGIST EVAL & MGMT  06/13/2017  . IR VERTEBROPLASTY CERV/THOR BX INC UNI/BIL INC/INJECT/IMAGING  06/17/2017  . TONSILLECTOMY  1961   Family History  Problem Relation Age of Onset  . Heart disease Mother   . CAD Mother   . Congestive Heart Failure Mother   . Osteoarthritis Mother   . Cancer Sister        breast  . Breast cancer Sister    Social History   Socioeconomic History  . Marital status: Widowed    Spouse name: [name]  . Number of children: 0  . Years of education: H/S  . Highest education level: 12th grade  Occupational History  . Occupation: Retired  Scientific laboratory technician  . Financial resource strain: Not hard at all  . Food insecurity    Worry: Never true    Inability: Never true  . Transportation needs    Medical: No    Non-medical: No  Tobacco Use  . Smoking status: Never Smoker  . Smokeless  tobacco: Never Used  Substance and Sexual Activity  . Alcohol use: No  . Drug use: No  . Sexual activity: Never  Lifestyle  . Physical activity    Days per week: 0 days    Minutes per session: 0 min  . Stress: Not at all  Relationships  . Social Herbalist on phone: Patient refused    Gets together: Patient refused    Attends religious service: Patient refused    Active member of club or organization: Patient refused    Attends meetings of clubs or organizations: Patient refused    Relationship status: Patient refused  Other Topics Concern  . Not on file  Social History Narrative  . Not on file    Outpatient Encounter Medications as of 09/28/2019   Medication Sig  . ADVAIR DISKUS 250-50 MCG/DOSE AEPB INHALE 1 PUFF INTO THE LUNGS 2 TIMES DAILY  . Calcium Carbonate-Vitamin D 600-400 MG-UNIT per tablet Take 1 tablet by mouth 2 (two) times daily.   . diclofenac sodium (VOLTAREN) 1 % GEL APPLY 2-4 GRAMS TO AFFECTED AREA UP TO 3 TIMES A DAY AS NEEDED  . dicyclomine (BENTYL) 10 MG capsule TAKE 1 CAPSULE BY MOUTH 3 TIMES A DAY ASNEEDED FOR SPASMS  . doxepin (SINEQUAN) 10 MG capsule TAKE 5 CAPSULES BY MOUTH DAILY (Patient taking differently: TAKE 3 CAPSULES BY MOUTH DAILY)  . fluticasone (FLONASE) 50 MCG/ACT nasal spray USE TWO SPRAYS IN EACH NOSTRIL ONCE A DAY  . nystatin (MYCOSTATIN) 100000 UNIT/ML suspension Take 5 mLs (500,000 Units total) by mouth 4 (four) times daily. (Patient taking differently: Take 5 mLs by mouth 4 (four) times daily. Use with steroid injection every 4 months)  . oxybutynin (DITROPAN XL) 15 MG 24 hr tablet Take 15 mg by mouth at bedtime.   . pentosan polysulfate (ELMIRON) 100 MG capsule Take 200 mg by mouth 2 (two) times daily.   . Tiotropium Bromide Monohydrate (SPIRIVA RESPIMAT) 1.25 MCG/ACT AERS Inhale 2 puffs into the lungs daily.  Marland Kitchen ULTRAM 50 MG tablet Take 1 tablet (50 mg total) by mouth every 8 (eight) hours as needed. for pain  . VENTOLIN HFA 108 (90 Base) MCG/ACT inhaler INHALE TWO PUFFS INTO THE LUNGS EVERY 4 HOURS AS NEEDED FOR WHEEZING OR SHORTNESS OF BREATH  . verapamil (CALAN-SR) 180 MG CR tablet TAKE 1/2 TABLET (90 mg TOTAL) BY MOUTH DAILY.  Marland Kitchen azithromycin (ZITHROMAX) 250 MG tablet Take 2 tablets PO on day one, and one tablet PO daily thereafter until completed. (Patient not taking: Reported on 09/28/2019)  . denosumab (PROLIA) 60 MG/ML SOSY injection Inject into the skin.  . furosemide (LASIX) 20 MG tablet TAKE 1 TABLET BY MOUTH DAILY AS NEEDED FOR EDEMA (Patient not taking: Reported on 09/28/2019)  . potassium chloride (K-DUR) 10 MEQ tablet Take 1 tablet (10 mEq total) by mouth daily. (Patient not taking:  Reported on 09/28/2019)  . predniSONE (DELTASONE) 10 MG tablet Take 6 tabs PO on day 1&2, 5 tabs PO on day 3&4, 4 tabs PO on day 5&6, 3 tabs PO on day 7&8, 2 tabs PO on day 9&10, 1 tab PO on day 11&12. (Patient not taking: Reported on 09/28/2019)  . urea (CARMOL) 40 % CREA Apply topically.   No facility-administered encounter medications on file as of 09/28/2019.     Activities of Daily Living In your present state of health, do you have any difficulty performing the following activities: 09/28/2019  Hearing? N  Vision? N  Difficulty  concentrating or making decisions? N  Walking or climbing stairs? N  Dressing or bathing? N  Doing errands, shopping? N  Preparing Food and eating ? N  Using the Toilet? N  In the past six months, have you accidently leaked urine? N  Do you have problems with loss of bowel control? N  Managing your Medications? N  Managing your Finances? N  Housekeeping or managing your Housekeeping? N  Some recent data might be hidden    Patient Care Team: Mar Daring, PA-C as PCP - General (Family Medicine) Domingo Pulse, MD as Consulting Physician (Urology) Gabriel Carina Betsey Holiday, MD as Physician Assistant (Endocrinology) Theresa Duty, MD (Radiology)    Assessment:   This is a routine wellness examination for Jos[place].  Exercise Activities and Dietary recommendations Current Exercise Habits: The patient does not participate in regular exercise at present, Exercise limited by: orthopedic condition(s)  Goals    . DIET - INCREASE WATER INTAKE     Recommend increasing water intake to 4 glasses a day.     . Have 3 meals a day     Recommend eating 3 healthy meals in a day.        Fall Risk: Fall Risk  09/28/2019 05/01/2018 04/24/2017 04/22/2016  Falls in the past year? 0 No No No  Number falls in past yr: 0 - - -  Injury with Fall? 0 - - -    FALL RISK PREVENTION PERTAINING TO THE HOME:  Any stairs in or around the home? Yes  If so, are there any  without handrails? No   Home free of loose throw rugs in walkways, pet beds, electrical cords, etc? Yes  Adequate lighting in your home to reduce risk of falls? Yes   ASSISTIVE DEVICES UTILIZED TO PREVENT FALLS:  Life alert? No  Use of a cane, walker or w/c? No  Grab bars in the bathroom? Yes Shower chair or bench in shower? No  Elevated toilet seat or a handicapped toilet? No    TIMED UP AND GO:  Was the test performed? No .    Depression Screen PHQ 2/9 Scores 09/28/2019 05/01/2018 05/01/2018 04/24/2017  PHQ - 2 Score 0 0 0 0  PHQ- 9 Score - 0 - 0     Cognitive Function     6CIT Screen 09/28/2019 04/24/2017  What Year? 0 points 0 points  What month? 0 points 0 points  What time? 0 points 0 points  Count back from 20 0 points 0 points  Months in reverse 0 points 0 points  Repeat phrase 0 points 2 points  Total Score 0 2    Immunization History  Administered Date(s) Administered  . Influenza Split 09/25/2009  . Influenza, High Dose Seasonal PF 09/27/2015, 10/21/2016, 09/22/2017, 09/25/2018  . Pneumococcal Conjugate-13 02/09/2015  . Pneumococcal Polysaccharide-23 10/07/2003  . Td 05/14/2007    Qualifies for Shingles Vaccine? Yes . Due for Shingrix. Education has been provided regarding the importance of this vaccine. Pt has been advised to call insurance company to determine out of pocket expense. Advised may also receive vaccine at local pharmacy or Health Dept. Verbalized acceptance and understanding.  Tdap: Although this vaccine is not a covered service during a Wellness Exam, does the patient still wish to receive this vaccine today?  No .   Flu Vaccine: Due for Flu vaccine. Does the patient want to receive this vaccine today?  No .   Pneumococcal Vaccine: Completed series  Screening Tests Health  Maintenance  Topic Date Due  . TETANUS/TDAP  05/13/2017  . INFLUENZA VACCINE  07/17/2019  . DEXA SCAN  05/27/2020  . MAMMOGRAM  08/04/2020  . PNA vac Low Risk  Adult  Completed    Cancer Screenings:  Colorectal Screening: No longer required.   Mammogram: Completed 08/05/19.   Bone Density: Completed 05/27/18. Results reflect OSTEOPOROSIS. Repeat every 2 years.   Lung Cancer Screening: (Low Dose CT Chest recommended if Age 54-80 years, 30 pack-year currently smoking OR have quit w/in 15years.) does not qualify.   Additional Screening:  Vision Screening: Recommended annual ophthalmology exams for early detection of glaucoma and other disorders of the eye.  Dental Screening: Recommended annual dental exams for proper oral hygiene  Community Resource Referral:  CRR required this visit?  No       Plan:  I have personally reviewed and addressed the Medicare Annual Wellness questionnaire and have noted the following in the patient's chart:  A. Medical and social history B. Use of alcohol, tobacco or illicit drugs  C. Current medications and supplements D. Functional ability and status E.  Nutritional status F.  Physical activity G. Advance directives H. List of other physicians I.  Hospitalizations, surgeries, and ER visits in previous 12 months J.  [place] such as hearing and vision if needed, cognitive and depression L. Referrals and appointments   In addition, I have reviewed and discussed with patient certain preventive protocols, quality metrics, and best practice recommendations. A written personalized care plan for preventive services as well as general preventive health recommendations were provided to patient. Nurse Health Advisor  Signed,    Rodrick Payson [place], [place]  [id number] Nurse Health Advisor   Nurse Notes: Pt to receive a flu shot at next in office apt.

## 2019-09-28 ENCOUNTER — Other Ambulatory Visit: Payer: Self-pay

## 2019-09-28 ENCOUNTER — Ambulatory Visit (INDEPENDENT_AMBULATORY_CARE_PROVIDER_SITE_OTHER): Payer: Medicare HMO

## 2019-09-28 DIAGNOSIS — Z0000 Encounter for general adult medical examination without abnormal findings: Secondary | ICD-10-CM | POA: Diagnosis not present

## 2019-09-28 NOTE — Patient Instructions (Signed)
Jackie Turner , Thank you for taking time to come for your Medicare Wellness Visit. I appreciate your ongoing commitment to your health goals. Please review the following plan we discussed and let me know if I can assist you in the future.   Screening recommendations/referrals: Colonoscopy: No longer required.  Mammogram: No longer required.  Bone Density: Up to date, due 05/2020 Recommended yearly ophthalmology/optometry visit for glaucoma screening and checkup Recommended yearly dental visit for hygiene and checkup  Vaccinations: Influenza vaccine: Currently due Pneumococcal vaccine: Completed series Tdap vaccine: Pt declines today.  Shingles vaccine: Pt declines today.     Advanced directives: Please bring a copy of your POA (Power of Attorney) and/or Living Will to your next appointment.   Conditions/risks identified: Continue to increase water intake to 6-8 8 oz glasses a day.   Next appointment: 10/01/19 @ 3:00 PM with Fenton Malling.    Preventive Care 29 Years and Older, Female Preventive care refers to lifestyle choices and visits with your health care provider that can promote health and wellness. What does preventive care include?  A yearly physical exam. This is also called an annual well check.  Dental exams once or twice a year.  Routine eye exams. Ask your health care provider how often you should have your eyes checked.  Personal lifestyle choices, including:  Daily care of your teeth and gums.  Regular physical activity.  Eating a healthy diet.  Avoiding tobacco and drug use.  Limiting alcohol use.  Practicing safe sex.  Taking low-dose aspirin every day.  Taking vitamin and mineral supplements as recommended by your health care provider. What happens during an annual well check? The services and screenings done by your health care provider during your annual well check will depend on your age, overall health, lifestyle risk factors, and family  history of disease. Counseling  Your health care provider may ask you questions about your:  Alcohol use.  Tobacco use.  Drug use.  Emotional well-being.  Home and relationship well-being.  Sexual activity.  Eating habits.  History of falls.  Memory and ability to understand (cognition).  Work and work Statistician.  Reproductive health. Screening  You may have the following tests or measurements:  Height, weight, and BMI.  Blood pressure.  Lipid and cholesterol levels. These may be checked every 5 years, or more frequently if you are over 67 years old.  Skin check.  Lung cancer screening. You may have this screening every year starting at age 81 if you have a 30-pack-year history of smoking and currently smoke or have quit within the past 15 years.  Fecal occult blood test (FOBT) of the stool. You may have this test every year starting at age 33.  Flexible sigmoidoscopy or colonoscopy. You may have a sigmoidoscopy every 5 years or a colonoscopy every 10 years starting at age 81.  Hepatitis C blood test.  Hepatitis B blood test.  Sexually transmitted disease (STD) testing.  Diabetes screening. This is done by checking your blood sugar (glucose) after you have not eaten for a while (fasting). You may have this done every 1-3 years.  Bone density scan. This is done to screen for osteoporosis. You may have this done starting at age 55.  Mammogram. This may be done every 1-2 years. Talk to your health care provider about how often you should have regular mammograms. Talk with your health care provider about your test results, treatment options, and if necessary, the need for more tests. Vaccines  Your health care provider may recommend certain vaccines, such as:  Influenza vaccine. This is recommended every year.  Tetanus, diphtheria, and acellular pertussis (Tdap, Td) vaccine. You may need a Td booster every 10 years.  Zoster vaccine. You may need this after  age 81.  Pneumococcal 13-valent conjugate (PCV13) vaccine. One dose is recommended after age 17.  Pneumococcal polysaccharide (PPSV23) vaccine. One dose is recommended after age 77. Talk to your health care provider about which screenings and vaccines you need and how often you need them. This information is not intended to replace advice given to you by your health care provider. Make sure you discuss any questions you have with your health care provider. Document Released: 12/29/2015 Document Revised: 08/21/2016 Document Reviewed: 10/03/2015 Elsevier Interactive Patient Education  2017 [place] Prevention in the Home Falls can cause injuries. They can happen to people of all ages. There are many things you can do to make your home safe and to help prevent falls. What can I do on the outside of my home?  Regularly fix the edges of walkways and driveways and fix any cracks.  Remove anything that might make you trip as you walk through a door, such as a raised step or threshold.  Trim any bushes or trees on the path to your home.  Use bright outdoor lighting.  Clear any walking paths of anything that might make someone trip, such as rocks or tools.  Regularly check to see if handrails are loose or broken. Make sure that both sides of any steps have handrails.  Any raised decks and porches should have guardrails on the edges.  Have any leaves, snow, or ice cleared regularly.  Use sand or salt on walking paths during winter.  Clean up any spills in your garage right away. This includes oil or grease spills. What can I do in the bathroom?  Use night lights.  Install grab bars by the toilet and in the tub and shower. Do not use towel bars as grab bars.  Use non-skid mats or decals in the tub or shower.  If you need to sit down in the shower, use a plastic, non-slip stool.  Keep the floor dry. Clean up any water that spills on the floor as soon as it happens.   Remove soap buildup in the tub or shower regularly.  Attach bath mats securely with double-sided non-slip rug tape.  Do not have throw rugs and other things on the floor that can make you trip. What can I do in the bedroom?  Use night lights.  Make sure that you have a light by your bed that is easy to reach.  Do not use any sheets or blankets that are too big for your bed. They should not hang down onto the floor.  Have a firm chair that has side arms. You can use this for support while you get dressed.  Do not have throw rugs and other things on the floor that can make you trip. What can I do in the kitchen?  Clean up any spills right away.  Avoid walking on wet floors.  Keep items that you use a lot in easy-to-reach places.  If you need to reach something above you, use a strong step stool that has a grab bar.  Keep electrical cords out of the way.  Do not use floor polish or wax that makes floors slippery. If you must use wax, use non-skid floor wax.  Do  not have throw rugs and other things on the floor that can make you trip. What can I do with my stairs?  Do not leave any items on the stairs.  Make sure that there are handrails on both sides of the stairs and use them. Fix handrails that are broken or loose. Make sure that handrails are as long as the stairways.  Check any carpeting to make sure that it is firmly attached to the stairs. Fix any carpet that is loose or worn.  Avoid having throw rugs at the top or bottom of the stairs. If you do have throw rugs, attach them to the floor with carpet tape.  Make sure that you have a light switch at the top of the stairs and the bottom of the stairs. If you do not have them, ask someone to add them for you. What else can I do to help prevent falls?  Wear shoes that:  Do not have high heels.  Have rubber bottoms.  Are comfortable and fit you well.  Are closed at the toe. Do not wear sandals.  If you use a  stepladder:  Make sure that it is fully opened. Do not climb a closed stepladder.  Make sure that both sides of the stepladder are locked into place.  Ask someone to hold it for you, if possible.  Clearly mark and make sure that you can see:  Any grab bars or handrails.  First and last steps.  Where the edge of each step is.  Use tools that help you move around (mobility aids) if they are needed. These include:  Canes.  Walkers.  Scooters.  Crutches.  Turn on the lights when you go into a dark area. Replace any light bulbs as soon as they burn out.  Set up your furniture so you have a clear path. Avoid moving your furniture around.  If any of your floors are uneven, fix them.  If there are any pets around you, be aware of where they are.  Review your medicines with your doctor. Some medicines can make you feel dizzy. This can increase your chance of falling. Ask your doctor what other things that you can do to help prevent falls. This information is not intended to replace advice given to you by your health care provider. Make sure you discuss any questions you have with your health care provider. Document Released: 09/28/2009 Document Revised: 05/09/2016 Document Reviewed: 01/06/2015 Elsevier Interactive Patient Education  2017 [employer].

## 2019-10-01 ENCOUNTER — Encounter: Payer: Self-pay | Admitting: Physician Assistant

## 2019-10-01 ENCOUNTER — Ambulatory Visit (INDEPENDENT_AMBULATORY_CARE_PROVIDER_SITE_OTHER): Payer: Medicare HMO | Admitting: Physician Assistant

## 2019-10-01 ENCOUNTER — Other Ambulatory Visit: Payer: Self-pay

## 2019-10-01 DIAGNOSIS — R1110 Vomiting, unspecified: Secondary | ICD-10-CM

## 2019-10-01 DIAGNOSIS — J479 Bronchiectasis, uncomplicated: Secondary | ICD-10-CM | POA: Diagnosis not present

## 2019-10-01 DIAGNOSIS — R110 Nausea: Secondary | ICD-10-CM

## 2019-10-01 DIAGNOSIS — J4540 Moderate persistent asthma, uncomplicated: Secondary | ICD-10-CM

## 2019-10-01 DIAGNOSIS — R630 Anorexia: Secondary | ICD-10-CM | POA: Diagnosis not present

## 2019-10-01 DIAGNOSIS — S22000D Wedge compression fracture of unspecified thoracic vertebra, subsequent encounter for fracture with routine healing: Secondary | ICD-10-CM | POA: Diagnosis not present

## 2019-10-01 MED ORDER — TRAMADOL HCL 50 MG PO TABS: 50.0000 mg | ORAL_TABLET | Freq: Three times a day (TID) | ORAL | 5 refills | Status: DC | PRN

## 2019-10-01 MED ORDER — TRELEGY ELLIPTA 100-62.5-25 MCG/INH IN AEPB: 1.0000 | INHALATION_SPRAY | Freq: Every day | RESPIRATORY_TRACT | 11 refills | Status: DC

## 2019-10-01 MED ORDER — ONDANSETRON HCL 4 MG PO TABS: 4.0000 mg | ORAL_TABLET | Freq: Three times a day (TID) | ORAL | 0 refills | Status: DC | PRN

## 2019-10-01 NOTE — Progress Notes (Signed)
Patient: Jackie Turner Female    DOB: 1936-02-06   83 y.o.   MRN: [id number] Visit Date: 10/04/2019  Today's Provider: Mar Daring, PA-C   Chief Complaint  Patient presents with   Nausea   Subjective:    I,Jackie Turner,RMA am acting as a Education administrator for [employer], PA-C.   Virtual Visit via Telephone Note  I connected with Jackie Turner on 10/04/19 at  3:00 PM EDT by telephone and verified that I am speaking with the correct person using two identifiers.  Location: Patient: home Provider: [facility]   I discussed the limitations, risks, security and privacy concerns of performing an evaluation and management service by telephone and the availability of in person appointments. I also discussed with the patient that there may be a patient responsible charge related to this service. The patient expressed understanding and agreed to proceed.   HPI  Patient c/o worsening nausea spells for the past weeks and dry heaves. She reports that she is not having any appetite. Denies weight loss.   Has noticed over the last several months she has felt nauseated.She can sit still and it will progress to dry heaves. Never gets anything up. Happens at all times per day, but notices mainly in the evening and through the night. Also states she has not had an appetite as well. On good days she will eat full meals, but more often than not she only eats PB crackers. Not affected by food. Uses Emetrol and that helps to lessen the nausea. During chart review she had similar issues in 05/07/19.   Does have a hiatal hernia and has had some issues with regurgitating some foods. Does also report some heartburn, worse after eating.   Does have IBS-D but denies any changes in her normal bowel movements.   Also mentions that she feels her Advair 250-50 is not working as well as it once did.   Allergies  Allergen Reactions   Nsaids Other (See Comments)    History of gastric  ulcer   Amoxicillin Nausea Only and Rash   Ibandronic Acid Other (See Comments)    Muscle weakness - advised not to take  **BONIVA** - drug name   Actonel  [Risedronate] Other (See Comments)    Gastric ulcers   Antihistamines, Chlorpheniramine-Type    Aspirin     Gastric ulcer   Buspar [Buspirone] Other (See Comments)    Pt does not remember reaction    Butalbital-Aspirin-Caffeine Other (See Comments)    Other reaction(s): Unknown   Clarithromycin Other (See Comments)    Bloating  *BIAXIN*   Codeine    Gatifloxacin Other (See Comments)   Hydrocodone-Guaifenesin Nausea Only    CODICLEAR DH STYRUP    Moxifloxacin    Oxycodone Other (See Comments)    Dizziness   Penicillins Other (See Comments)   Risedronate Sodium     Gastric ulcer   Tylenol With Codeine #3  [Acetaminophen-Codeine]     Other reaction(s): Unknown   Raloxifene Other (See Comments)    Bloating Bloating  *EVISTA*     Current Outpatient Medications:    Calcium Carbonate-Vitamin D 600-400 MG-UNIT per tablet, Take 1 tablet by mouth 2 (two) times daily. , Disp: , Rfl:    denosumab (PROLIA) 60 MG/ML SOSY injection, Inject into the skin., Disp: , Rfl:    diclofenac sodium (VOLTAREN) 1 % GEL, APPLY 2-4 GRAMS TO AFFECTED AREA UP TO 3 TIMES A DAY AS  NEEDED, Disp: 1 Tube, Rfl: 5   dicyclomine (BENTYL) 10 MG capsule, TAKE 1 CAPSULE BY MOUTH 3 TIMES A DAY ASNEEDED FOR SPASMS, Disp: 120 capsule, Rfl: 5   doxepin (SINEQUAN) 10 MG capsule, TAKE 5 CAPSULES BY MOUTH DAILY (Patient taking differently: TAKE 3 CAPSULES BY MOUTH DAILY), Disp: 450 capsule, Rfl: 1   fluticasone (FLONASE) 50 MCG/ACT nasal spray, USE TWO SPRAYS IN EACH NOSTRIL ONCE A DAY, Disp: 16 g, Rfl: 5   nystatin (MYCOSTATIN) 100000 UNIT/ML suspension, Take 5 mLs (500,000 Units total) by mouth 4 (four) times daily. (Patient taking differently: Take 5 mLs by mouth 4 (four) times daily. Use with steroid injection every 4 months), Disp: 60  mL, Rfl: 0   oxybutynin (DITROPAN XL) 15 MG 24 hr tablet, Take 15 mg by mouth at bedtime. , Disp: , Rfl:    pentosan polysulfate (ELMIRON) 100 MG capsule, Take 200 mg by mouth 2 (two) times daily. , Disp: , Rfl:    Tiotropium Bromide Monohydrate (SPIRIVA RESPIMAT) 1.25 MCG/ACT AERS, Inhale 2 puffs into the lungs daily., Disp: 4 g, Rfl: 5   traMADol (ULTRAM) 50 MG tablet, Take 1 tablet (50 mg total) by mouth every 8 (eight) hours as needed. for pain, Disp: 90 tablet, Rfl: 5   urea (CARMOL) 40 % CREA, Apply topically., Disp: , Rfl:    VENTOLIN HFA 108 (90 Base) MCG/ACT inhaler, INHALE TWO PUFFS INTO THE LUNGS EVERY 4 HOURS AS NEEDED FOR WHEEZING OR SHORTNESS OF BREATH, Disp: 18 g, Rfl: 5   verapamil (CALAN-SR) 180 MG CR tablet, TAKE 1/2 TABLET (90 mg TOTAL) BY MOUTH DAILY., Disp: 45 tablet, Rfl: 1   Fluticasone-Umeclidin-Vilant (TRELEGY ELLIPTA) 100-62.5-25 MCG/INH AEPB, Inhale 1 puff into the lungs daily., Disp: 60 each, Rfl: 11   furosemide (LASIX) 20 MG tablet, TAKE 1 TABLET BY MOUTH DAILY AS NEEDED FOR EDEMA (Patient not taking: Reported on 09/28/2019), Disp: 90 tablet, Rfl: 3   ondansetron (ZOFRAN) 4 MG tablet, Take 1 tablet (4 mg total) by mouth every 8 (eight) hours as needed for nausea or vomiting., Disp: 20 tablet, Rfl: 0   potassium chloride (K-DUR) 10 MEQ tablet, Take 1 tablet (10 mEq total) by mouth daily. (Patient not taking: Reported on 09/28/2019), Disp: 90 tablet, Rfl: 3  Review of Systems  Constitutional: Positive for appetite change. Negative for fatigue and unexpected weight change.  HENT: Positive for trouble swallowing (occasionally with certain foods; feels like they get stuck and she has to "get it back up").   Respiratory: Negative.  Negative for cough.   Cardiovascular: Negative for chest pain, palpitations and leg swelling.  Gastrointestinal: Positive for diarrhea (chronic with IBS-D but denies any changes), nausea and vomiting (dry heaving). Negative for  abdominal distention, abdominal pain, anal bleeding, blood in stool, constipation and rectal pain.       Some mild indigestion  Neurological: Negative for dizziness, weakness and headaches.  Psychiatric/Behavioral: Negative for agitation, dysphoric mood and sleep disturbance. The patient is not nervous/anxious.     Social History   Tobacco Use   Smoking status: Never Smoker   Smokeless tobacco: Never Used  Substance Use Topics   Alcohol use: No      Objective:   There were no vitals taken for this visit. There were no vitals filed for this visit.There is no height or weight on file to calculate BMI.   Physical Exam Vitals signs reviewed.  Constitutional:      General: She is not in acute distress. Pulmonary:  Effort: Pulmonary effort is normal. No respiratory distress.  Abdominal:     Comments: Denies abdominal pain when she presses on her stomach  Neurological:     Mental Status: She is alert.     No results found for any visits on 10/01/19.     Assessment & Plan    1. Moderate persistent asthma without complication Will change therapy from Advair to Trelegy as below. Continue albuterol rescue inhaler prn. Follow up in 4 weeks to see if symptoms improving.  - Fluticasone-Umeclidin-Vilant (TRELEGY ELLIPTA) 100-62.5-25 MCG/INH AEPB; Inhale 1 puff into the lungs daily.  Dispense: 60 each; Refill: 11  2. Bronchiectasis without complication ([place]) See above medical treatment plan. - Fluticasone-Umeclidin-Vilant (TRELEGY ELLIPTA) 100-62.5-25 MCG/INH AEPB; Inhale 1 puff into the lungs daily.  Dispense: 60 each; Refill: 11  3. Nausea DDx: gastritis/gastroenteritis, IBS, PUD/h.pylori, hiatal hernia, GERD. Will treat nausea with Zofran as below. Continue her other medications for IBS as prescribed. Will refer to GI as below for further evaluation since symptoms have been off and on since May 2020. She agrees with referral and further evaluation instead of continuing  symptom management.  - ondansetron (ZOFRAN) 4 MG tablet; Take 1 tablet (4 mg total) by mouth every 8 (eight) hours as needed for nausea or vomiting.  Dispense: 20 tablet; Refill: 0 - Ambulatory referral to Gastroenterology  4. Dry heaves See above medical treatment plan. - ondansetron (ZOFRAN) 4 MG tablet; Take 1 tablet (4 mg total) by mouth every 8 (eight) hours as needed for nausea or vomiting.  Dispense: 20 tablet; Refill: 0 - Ambulatory referral to Gastroenterology  5. Decreased appetite See above medical treatment plan. - ondansetron (ZOFRAN) 4 MG tablet; Take 1 tablet (4 mg total) by mouth every 8 (eight) hours as needed for nausea or vomiting.  Dispense: 20 tablet; Refill: 0 - Ambulatory referral to Gastroenterology  6. Closed compression fracture of thoracic vertebra with routine healing, subsequent encounter Stable. Diagnosis pulled for medication refill. Continue current medical treatment plan. Patient reports the generic does not work as well as the Brand but that her pharmacy is having difficulties getting the brand so she wanted to let the pharmacy know she is ok with generic if that is all they have.  - traMADol (ULTRAM) 50 MG tablet; Take 1 tablet (50 mg total) by mouth every 8 (eight) hours as needed. for pain  Dispense: 90 tablet; Refill: 5    I discussed the assessment and treatment plan with the patient. The patient was provided an opportunity to ask questions and all were answered. The patient agreed with the plan and demonstrated an understanding of the instructions.   The patient was advised to call back or seek an in-person evaluation if the symptoms worsen or if the condition fails to improve as anticipated.  I provided 20 minutes of non-face-to-face time during this encounter.   Mar Daring, PA-C  [facility]

## 2019-10-01 NOTE — Patient Instructions (Signed)
Nausea, Adult Nausea is the feeling that you have an upset stomach or that you are about to vomit. Nausea on its own is not usually a serious concern, but it may be an early sign of a more serious medical problem. As nausea gets worse, it can lead to vomiting. If vomiting develops, or if you are not able to drink enough fluids, you are at risk of becoming dehydrated. Dehydration can make you tired and thirsty, cause you to have a dry mouth, and decrease how often you urinate. Older adults and people with other diseases or a weak disease-fighting system (immune system) are at higher risk for dehydration. The main goals of treating your nausea are:  To relieve your nausea.  To limit repeated nausea episodes.  To prevent vomiting and dehydration. Follow these instructions at home: Watch your symptoms for any changes. Tell your health care provider about them. Follow these instructions as told by your health care provider. Eating and drinking      Take an oral rehydration solution (ORS). This is a drink that is sold at pharmacies and retail stores.  Drink clear fluids slowly and in small amounts as you are able. Clear fluids include water, ice chips, low-calorie sports drinks, and fruit juice that has water added (diluted fruit juice).  Eat bland, easy-to-digest foods in small amounts as you are able. These foods include bananas, applesauce, rice, lean meats, toast, and crackers.  Avoid drinking fluids that contain a lot of sugar or caffeine, such as energy drinks, sports drinks, and soda.  Avoid alcohol.  Avoid spicy or fatty foods. General instructions  Take over-the-counter and prescription medicines only as told by your health care provider.  Rest at home while you recover.  Drink enough fluid to keep your urine pale yellow.  Breathe slowly and deeply when you feel nauseous.  Avoid smelling things that have strong odors.  Wash your hands often using soap and water. If soap and  water are not available, use hand sanitizer.  Make sure that all people in your household wash their hands well and often.  Keep all follow-up visits as told by your health care provider. This is important. Contact a health care provider if:  Your nausea gets worse.  Your nausea does not go away after two days.  You vomit.  You cannot drink fluids without vomiting.  You have any of the following: ? New symptoms. ? A fever. ? A headache. ? Muscle cramps. ? A rash. ? Pain while urinating.  You feel light-headed or dizzy. Get help right away if:  You have pain in your chest, neck, arm, or jaw.  You feel extremely weak or you faint.  You have vomit that is bright red or looks like coffee grounds.  You have bloody or black stools or stools that look like tar.  You have a severe headache, a stiff neck, or both.  You have severe pain, cramping, or bloating in your abdomen.  You have difficulty breathing or are breathing very quickly.  Your heart is beating very quickly.  Your skin feels cold and clammy.  You feel confused.  You have signs of dehydration, such as: ? Dark urine, very little urine, or no urine. ? Cracked lips. ? Dry mouth. ? Sunken eyes. ? Sleepiness. ? Weakness. These symptoms may represent a serious problem that is an emergency. Do not wait to see if the symptoms will go away. Get medical help right away. Call your local emergency services (911  in the [place] Do not drive yourself to the hospital. Summary  Nausea is the feeling that you have an upset stomach or that you are about to vomit. Nausea on its own is not usually a serious concern, but it may be an early sign of a more serious medical problem.  If vomiting develops, or if you are not able to drink enough fluids, you are at risk of becoming dehydrated.  Follow recommendations for eating and drinking and take over-the-counter and prescription medicines only as told by your health care  provider.  Contact a health care provider right away if your symptoms worsen or you have new symptoms.  Keep all follow-up visits as told by your health care provider. This is important. This information is not intended to replace advice given to you by your health care provider. Make sure you discuss any questions you have with your health care provider. Document Released: 01/09/2005 Document Revised: 05/12/2018 Document Reviewed: 05/12/2018 Elsevier Patient Education  2020 [employer]  

## 2019-10-04 ENCOUNTER — Encounter: Payer: Self-pay | Admitting: Physician Assistant

## 2019-10-08 ENCOUNTER — Telehealth: Payer: Self-pay | Admitting: Physician Assistant

## 2019-10-08 NOTE — Telephone Encounter (Signed)
error 

## 2019-10-15 ENCOUNTER — Telehealth: Payer: Self-pay | Admitting: Physician Assistant

## 2019-10-15 NOTE — Telephone Encounter (Signed)
Patient was advised and states that the Tramadol is making her sick. Patient states that her pharmacy can not get the ULTRAM due to a shortage on them. Patient states that she understands that she can not get another prescription send in and she will wait for Tawanna Sat to come back in the office on Nov.9 to get it worked out. FYI

## 2019-10-15 NOTE — Telephone Encounter (Signed)
These are the same medications. I cannot dispense additional controlled substances beyond what her provider has given her which was filled on 10/01/2019. It would be considered an early refill. It looks like she was having nausea at her 10/01/2019 with Tawanna Sat and this has been occurring intermittently so I doubt it would be related to tramadol. I will refill her zofran if she needs it.

## 2019-10-15 NOTE — Telephone Encounter (Signed)
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

## 2019-10-15 NOTE — Telephone Encounter (Signed)
Pt needing a call back regarding feeling very nauseous and having dry heaves when she is taking tramadol 50mg  for pain.  However, she had a few of the ULTRAM 50MG  as back up. When she takes the brand name ULTRAM 50mg  she does not feel nauseous.    Pt wanting to know if the ULTRAM 50 mg can be called in for her since she doesn't feel sick taking them?  Please call pt back at [phone]  to let her know if this can be called in asap.  Thanks, [employer]

## 2019-10-22 DIAGNOSIS — M48061 Spinal stenosis, lumbar region without neurogenic claudication: Secondary | ICD-10-CM | POA: Diagnosis not present

## 2019-10-22 DIAGNOSIS — R1031 Right lower quadrant pain: Secondary | ICD-10-CM | POA: Diagnosis not present

## 2019-10-27 ENCOUNTER — Ambulatory Visit: Payer: Medicare HMO

## 2019-10-27 ENCOUNTER — Other Ambulatory Visit: Payer: Self-pay

## 2019-11-10 ENCOUNTER — Other Ambulatory Visit: Payer: Self-pay

## 2019-11-10 ENCOUNTER — Ambulatory Visit: Payer: Self-pay | Admitting: Physician Assistant

## 2019-11-10 ENCOUNTER — Ambulatory Visit (INDEPENDENT_AMBULATORY_CARE_PROVIDER_SITE_OTHER): Payer: Medicare HMO | Admitting: Physician Assistant

## 2019-11-10 VITALS — Temp 97.5°F

## 2019-11-10 DIAGNOSIS — Z23 Encounter for immunization: Secondary | ICD-10-CM | POA: Diagnosis not present

## 2019-11-18 ENCOUNTER — Ambulatory Visit (INDEPENDENT_AMBULATORY_CARE_PROVIDER_SITE_OTHER): Payer: Medicare HMO | Admitting: Vascular Surgery

## 2019-11-29 ENCOUNTER — Other Ambulatory Visit: Payer: Self-pay | Admitting: Physician Assistant

## 2019-11-29 DIAGNOSIS — I10 Essential (primary) hypertension: Secondary | ICD-10-CM

## 2020-01-07 DIAGNOSIS — R3915 Urgency of urination: Secondary | ICD-10-CM | POA: Diagnosis not present

## 2020-01-07 DIAGNOSIS — R300 Dysuria: Secondary | ICD-10-CM | POA: Diagnosis not present

## 2020-01-07 DIAGNOSIS — N3010 Interstitial cystitis (chronic) without hematuria: Secondary | ICD-10-CM | POA: Diagnosis not present

## 2020-01-07 DIAGNOSIS — R350 Frequency of micturition: Secondary | ICD-10-CM | POA: Diagnosis not present

## 2020-02-25 ENCOUNTER — Other Ambulatory Visit: Payer: Self-pay | Admitting: Physician Assistant

## 2020-02-25 DIAGNOSIS — J4540 Moderate persistent asthma, uncomplicated: Secondary | ICD-10-CM

## 2020-02-25 DIAGNOSIS — J301 Allergic rhinitis due to pollen: Secondary | ICD-10-CM

## 2020-04-07 ENCOUNTER — Other Ambulatory Visit: Payer: Self-pay | Admitting: Physician Assistant

## 2020-04-07 DIAGNOSIS — S22000D Wedge compression fracture of unspecified thoracic vertebra, subsequent encounter for fracture with routine healing: Secondary | ICD-10-CM

## 2020-04-07 NOTE — Telephone Encounter (Signed)
Requested medication (s) are due for refill today: yes  Requested medication (s) are on the active medication list: yes  Last refill:  03/08/2020  Future visit scheduled: no  Notes to clinic:  this refill cannot be delegated    Requested Prescriptions  Pending Prescriptions Disp Refills   traMADol (ULTRAM) 50 MG tablet [Pharmacy Med Name: TRAMADOL HCL 50 MG TAB] 90 tablet     Sig: TAKE 1 TABLET BY MOUTH EVERY 8 HOURS AS NEEDED FOR PAIN      Not Delegated - Analgesics:  Opioid Agonists Failed - 04/07/2020 10:20 AM      Failed - This refill cannot be delegated      Failed - Urine Drug Screen completed in last 360 days.      Failed - Valid encounter within last 6 months    Recent Outpatient Visits           6 months ago Bronchiectasis without complication [facility])   [place], [place]   11 months ago Ear popping, left   [place], [place], [place]   11 months ago Nausea   [place], [place], [place]   1 year ago Moderate persistent asthma with acute exacerbation   [place], [place]   1 year ago Moderate persistent asthma without complication   [facility] [place], [place], [place]

## 2020-05-24 ENCOUNTER — Other Ambulatory Visit: Payer: Self-pay | Admitting: Physician Assistant

## 2020-05-24 DIAGNOSIS — J4530 Mild persistent asthma, uncomplicated: Secondary | ICD-10-CM

## 2020-05-24 NOTE — Telephone Encounter (Signed)
Requested Prescriptions  Pending Prescriptions Disp Refills   ADVAIR DISKUS 250-50 MCG/DOSE AEPB [Pharmacy Med Name: ADVAIR DISKUS 250-50 MCG/DOSE INH A] 60 each     Sig: INHALE 1 PUFF INTO THE LUNGS 2 TIMES DAILY     Pulmonology:  Combination Products Passed - 05/24/2020  1:26 PM      Passed - Valid encounter within last 12 months    Recent Outpatient Visits          7 months ago Bronchiectasis without complication [facility])   [facility] [place], Clearnce [place]rel, [place]   1 year ago Ear popping, left   [facility] [place], [place], [place]   1 year ago Nausea   [facility] Carles Collet M, [place]   1 year ago Moderate persistent asthma with acute exacerbation   [facility] [place], [place], [place]   1 year ago Moderate persistent asthma without complication   [place], Jeralyn Bennett              VENTOLIN HFA 108 (90 Base) MCG/ACT inhaler [Pharmacy Med Name: VENTOLIN HFA 108 (90 BASE) MCG/ACT] 18 g 5    Sig: INHALE TWO PUFFS INTO THE LUNGS EVERY 4 HOURS AS NEEDED FOR WHEEZING OR SHORTNESS OF BREATH     Pulmonology:  Beta Agonists Failed - 05/24/2020  1:26 PM      Failed - One inhaler should last at least one month. If the patient is requesting refills earlier, contact the patient to check for uncontrolled symptoms.      Passed - Valid encounter within last 12 months    Recent Outpatient Visits          7 months ago Bronchiectasis without complication [facility])   [facility] [place], Clearnce Sorrel, [place]   1 year ago Ear popping, left   [facility] [place], [place], [place]   1 year ago Nausea   [place], [place], [place]   1 year ago Moderate persistent asthma with acute exacerbation   [place], [place]   1 year ago Moderate persistent asthma without complication   [place]              Request for Advair being sent to Provider, as not currently on this Medication.

## 2020-05-24 NOTE — Telephone Encounter (Signed)
Requested medication (s) are due for refill today:  No  Requested medication (s) are on the active medication list:  no  Future visit scheduled:  no  Last Refill: 07/12/2019; change in Therapy 10/01/2019 to Trelegy  Note to clinic:  called [name]; Pharmacist reported the pt. Switches back and forth between the Advair Diskus and the Trelegy.  (Noted in office note the Advair was d/c'd 10/01/19 and Trelegy was ordered)  Pharmacist stated pt. Specifically requested Advair today.   Requested Prescriptions  Pending Prescriptions Disp Refills   ADVAIR DISKUS 250-50 MCG/DOSE AEPB [Pharmacy Med Name: ADVAIR DISKUS 250-50 MCG/DOSE INH A] 60 each     Sig: INHALE 1 PUFF INTO THE LUNGS 2 TIMES DAILY      Pulmonology:  Combination Products Passed - 05/24/2020  1:26 PM      Passed - Valid encounter within last 12 months    Recent Outpatient Visits           7 months ago Bronchiectasis without complication [facility])   [facility] [place], Clearnce [place]rel, [place]   1 year ago Ear popping, left   [facility] [place], [place], [place]   1 year ago Nausea   [facility] Carles Collet M, [place]   1 year ago Moderate persistent asthma with acute exacerbation   [facility] [place], [place], [place]   1 year ago Moderate persistent asthma without complication   [facility] [place], Anderson Malta M, [place]               Signed Prescriptions Disp Refills   VENTOLIN HFA 108 (90 Base) MCG/ACT inhaler 18 g 5    Sig: INHALE TWO PUFFS INTO THE LUNGS EVERY 4 HOURS AS NEEDED FOR WHEEZING OR SHORTNESS OF BREATH      Pulmonology:  Beta Agonists Failed - 05/24/2020  1:26 PM      Failed - One inhaler should last at least one month. If the patient is requesting refills earlier, contact the patient to check for uncontrolled symptoms.      Passed - Valid encounter within last 12 months    Recent Outpatient Visits           7 months  ago Bronchiectasis without complication [facility])   [place], Clearnce Sorrel, [place]   1 year ago Ear popping, left   [facility] [place], [place], [place]   1 year ago Nausea   [place], [place], [place]   1 year ago Moderate persistent asthma with acute exacerbation   [place], [place]   1 year ago Moderate persistent asthma without complication   [facility] [place], [place], [place]

## 2020-05-30 ENCOUNTER — Telehealth: Payer: Self-pay

## 2020-05-30 DIAGNOSIS — J0140 Acute pansinusitis, unspecified: Secondary | ICD-10-CM

## 2020-05-30 MED ORDER — AZITHROMYCIN 250 MG PO TABS: ORAL_TABLET | ORAL | 0 refills | Status: DC

## 2020-05-30 NOTE — Telephone Encounter (Signed)
Zpak sent in 

## 2020-05-30 NOTE — Telephone Encounter (Signed)
Copied from [place] [id number]. Topic: General - Other >> May 30, 2020  2:27 PM Leward Quan A wrote: Reason for CRM: Patient called to inform Fenton Malling that she have had a headache on and off for over a week now states that she have been taking Mucinex but does not seem to be working anymore. Patient would like to know id Anderson Malta can please send an Rx to the pharmacy to help her because she think it is sinus related. Patient have an appointment scheduled on 06/01/20. Please call patient at Ph# [phone]

## 2020-06-01 ENCOUNTER — Ambulatory Visit (INDEPENDENT_AMBULATORY_CARE_PROVIDER_SITE_OTHER): Payer: Medicare HMO | Admitting: Physician Assistant

## 2020-06-01 DIAGNOSIS — M5416 Radiculopathy, lumbar region: Secondary | ICD-10-CM | POA: Diagnosis not present

## 2020-06-01 DIAGNOSIS — M48061 Spinal stenosis, lumbar region without neurogenic claudication: Secondary | ICD-10-CM

## 2020-06-01 DIAGNOSIS — J4540 Moderate persistent asthma, uncomplicated: Secondary | ICD-10-CM | POA: Diagnosis not present

## 2020-06-01 DIAGNOSIS — J479 Bronchiectasis, uncomplicated: Secondary | ICD-10-CM | POA: Insufficient documentation

## 2020-06-01 DIAGNOSIS — M8000XD Age-related osteoporosis with current pathological fracture, unspecified site, subsequent encounter for fracture with routine healing: Secondary | ICD-10-CM

## 2020-06-01 DIAGNOSIS — S22000D Wedge compression fracture of unspecified thoracic vertebra, subsequent encounter for fracture with routine healing: Secondary | ICD-10-CM

## 2020-06-01 MED ORDER — ULTRAM 50 MG PO TABS: 50.0000 mg | ORAL_TABLET | Freq: Four times a day (QID) | ORAL | 5 refills | Status: DC | PRN

## 2020-06-01 MED ORDER — ADVAIR DISKUS 250-50 MCG/DOSE IN AEPB: INHALATION_SPRAY | RESPIRATORY_TRACT | 11 refills | Status: DC

## 2020-06-01 NOTE — Progress Notes (Signed)
Virtual telephone visit    Virtual Visit via Telephone Note   This visit type was conducted due to national recommendations for restrictions regarding the COVID-19 Pandemic (e.g. social distancing) in an effort to limit this patient's exposure and mitigate transmission in our community. Due to her co-morbid illnesses, this patient is at least at moderate risk for complications without adequate follow up. This format is felt to be most appropriate for this patient at this time. The patient did not have access to video technology or had technical difficulties with video requiring transitioning to audio format only (telephone). Physical exam was limited to content and character of the telephone converstion.    Patient location: Home Provider location: [facility]   Visit Date: 06/01/2020  Today's healthcare provider: Mar Daring, PA-C   Chief Complaint  Patient presents with  . Medication Problem   Subjective    HPI Patient reports dry heaves today. She feels that the Tramadol is causing nausea, Dry heaves. But she is aware that she is not able to take the Ultram like before.   Patient also wants to talk to provider about the Prolia shots. She feels like she is having side effects from it.   Asthma: she reports that her breathing is horrible. Reports that she is using the 4 inhalers.   Reports that she has been having panic attack specially at night when she is going to take shower. Feels like her shortness of breath increases.   Patient Active Problem List   Diagnosis Date Noted  . Bronchiectasis without complication ([place]) 24/40/1027  . Lymphedema 05/17/2019  . Chronic venous insufficiency 05/17/2019  . Swelling of limb 02/10/2018  . Varicose veins of leg with swelling, bilateral 02/10/2018  . Squamous cell carcinoma 09/22/2017  . Hypoxia 09/27/2015  . Abnormal CXR 09/27/2015  . Asthma with allergic rhinitis 08/31/2015  . Back ache 08/31/2015  . Chronic interstitial  cystitis 08/31/2015  . Grief reaction 08/31/2015  . Hyperlipidemia 06/26/2015  . Migraine 06/26/2015  . Asthma 06/26/2015  . IBS (irritable bowel syndrome) 06/26/2015  . Osteoporosis 06/26/2015  . Fatigue 06/26/2015  . Lumbar radiculopathy 04/27/2013  . Urinary urgency 01/08/2012  . Frequency of urination 01/08/2012   Past Medical History:  Diagnosis Date  . Allergy   . Asthma   . Hyperlipidemia   . Osteoporosis       Medications: Outpatient Medications Prior to Visit  Medication Sig  . ADVAIR DISKUS 250-50 MCG/DOSE AEPB INHALE 1 PUFF INTO THE LUNGS 2 TIMES DAILY  . denosumab (PROLIA) 60 MG/ML SOSY injection Inject into the skin.  Marland Kitchen diclofenac sodium (VOLTAREN) 1 % GEL APPLY 2-4 GRAMS TO AFFECTED AREA UP TO 3 TIMES A DAY AS NEEDED  . dicyclomine (BENTYL) 10 MG capsule TAKE 1 CAPSULE BY MOUTH 3 TIMES A DAY ASNEEDED FOR SPASMS  . doxepin (SINEQUAN) 10 MG capsule TAKE 5 CAPSULES BY MOUTH DAILY (Patient taking differently: TAKE 3 CAPSULES BY MOUTH DAILY)  . fluticasone (FLONASE) 50 MCG/ACT nasal spray USE 2 SPRAYS IN EACH NOSTRIL ONCE A DAY.  Marland Kitchen Fluticasone-Umeclidin-Vilant (TRELEGY ELLIPTA) 100-62.5-25 MCG/INH AEPB Inhale 1 puff into the lungs daily.  Marland Kitchen nystatin (MYCOSTATIN) 100000 UNIT/ML suspension Take 5 mLs (500,000 Units total) by mouth 4 (four) times daily.  . ondansetron (ZOFRAN) 4 MG tablet Take 1 tablet (4 mg total) by mouth every 8 (eight) hours as needed for nausea or vomiting.  Marland Kitchen oxybutynin (DITROPAN XL) 15 MG 24 hr tablet Take 15 mg by mouth at  bedtime.   . pentosan polysulfate (ELMIRON) 100 MG capsule Take 200 mg by mouth 2 (two) times daily.   Marland Kitchen SPIRIVA RESPIMAT 1.25 MCG/ACT AERS INHALE 2 PUFFS BY MOUTH INTO THE LUNGS DAILY  . traMADol (ULTRAM) 50 MG tablet TAKE 1 TABLET BY MOUTH EVERY 8 HOURS AS NEEDED FOR PAIN  . urea (CARMOL) 40 % CREA Apply topically.  . VENTOLIN HFA 108 (90 Base) MCG/ACT inhaler INHALE TWO PUFFS INTO THE LUNGS EVERY 4 HOURS AS NEEDED FOR WHEEZING  OR SHORTNESS OF BREATH  . verapamil (CALAN-SR) 180 MG CR tablet TAKE 1/2 TABLET (90 mg TOTAL) BY MOUTH DAILY.  Marland Kitchen azithromycin (ZITHROMAX) 250 MG tablet Take 2 tablets PO on day one, and one tablet PO daily thereafter until completed.  . Calcium Carbonate-Vitamin D 600-400 MG-UNIT per tablet Take 1 tablet by mouth 2 (two) times daily.  (Patient not taking: Reported on 06/01/2020)  . furosemide (LASIX) 20 MG tablet TAKE 1 TABLET BY MOUTH DAILY AS NEEDED FOR EDEMA (Patient not taking: Reported on 09/28/2019)  . potassium chloride (K-DUR) 10 MEQ tablet Take 1 tablet (10 mEq total) by mouth daily. (Patient not taking: Reported on 09/28/2019)   No facility-administered medications prior to visit.    Review of Systems  Constitutional: Negative.   Respiratory: Positive for cough and shortness of breath.   Cardiovascular: Negative.   Gastrointestinal: Negative.   Musculoskeletal: Positive for back pain.  Neurological: Positive for weakness. Negative for numbness.  Psychiatric/Behavioral: Positive for dysphoric mood.    Last CBC Lab Results  Component Value Date   WBC 7.3 05/21/2018   HGB 14.7 05/21/2018   HCT 43.8 05/21/2018   MCV 87 05/21/2018   MCH 29.1 05/21/2018   RDW 14.9 05/21/2018   PLT 299 98/33/8250   Last metabolic panel Lab Results  Component Value Date   GLUCOSE 117 (H) 05/21/2018   NA 143 05/21/2018   K 5.2 06/19/2018   CL 99 05/21/2018   CO2 29 05/21/2018   BUN 19 05/21/2018   CREATININE 0.75 05/21/2018   GFRNONAA 76 05/21/2018   GFRAA 87 05/21/2018   CALCIUM 9.8 05/21/2018   PROT 6.7 05/21/2018   ALBUMIN 4.6 05/21/2018   LABGLOB 2.1 05/21/2018   AGRATIO 2.2 05/21/2018   BILITOT 0.3 05/21/2018   ALKPHOS 74 05/21/2018   AST 14 05/21/2018   ALT 18 05/21/2018   ANIONGAP 9 06/17/2017      Objective    There were no vitals taken for this visit. BP Readings from Last 3 Encounters:  05/17/19 120/66  05/12/19 119/69  03/31/19 108/68   Wt Readings from Last 3  Encounters:  05/17/19 118 lb (53.5 kg)  05/12/19 117 lb (53.1 kg)  03/31/19 122 lb (55.3 kg)        Assessment & Plan     1. Age-related osteoporosis with current pathological fracture with routine healing, subsequent encounter Due for repeat bone density. Has been on Prolia, but having side effects. Wishes to stop. Will check BMD first and if not great improvements then will be ok to stop with patient understanding her risk of increased fractures.  - DG Bone Density; Future  2. Moderate persistent asthma, unspecified whether complicated Stable. Patient was using too many inhalers. Discussed medications and patient aware to use only Trelegy and Ventolin. Also advised she can use Ventolin more than once daily. Will f/u in 4-6 weeks to see if this helps breathing and thrush.   3. Lumbar radiculopathy Will change back to Ultram brand  as below. Tramadol generic causes nausea and dry heaves. Tolerates brand much better.  - ULTRAM 50 MG tablet; Take 1 tablet (50 mg total) by mouth every 6 (six) hours as needed.  Dispense: 90 tablet; Refill: 5  4. Spinal stenosis of lumbar region without neurogenic claudication See above medical treatment plan. - ULTRAM 50 MG tablet; Take 1 tablet (50 mg total) by mouth every 6 (six) hours as needed.  Dispense: 90 tablet; Refill: 5  5. Bronchiectasis without complication ([place]) See above medical treatment plan for #2.   6. Closed compression fracture of thoracic vertebra with routine healing, subsequent encounter See above medical treatment plan for #3.  - ULTRAM 50 MG tablet; Take 1 tablet (50 mg total) by mouth every 6 (six) hours as needed.  Dispense: 90 tablet; Refill: 5 - DG Bone Density; Future   No follow-ups on file.    I discussed the assessment and treatment plan with the patient. The patient was provided an opportunity to ask questions and all were answered. The patient agreed with the plan and demonstrated an understanding of the  instructions.   The patient was advised to call back or seek an in-person evaluation if the symptoms worsen or if the condition fails to improve as anticipated.  I provided 28 minutes of non-face-to-face time during this encounter.  Reynolds Bowl, PA-C, have reviewed all documentation for this visit. The documentation on 06/04/20 for the exam, diagnosis, procedures, and orders are all accurate and complete.  [place] [facility] [phone] (phone) [phone] (fax)  [place]

## 2020-06-02 ENCOUNTER — Telehealth: Payer: Self-pay | Admitting: Physician Assistant

## 2020-06-02 DIAGNOSIS — M48061 Spinal stenosis, lumbar region without neurogenic claudication: Secondary | ICD-10-CM

## 2020-06-02 DIAGNOSIS — S22000D Wedge compression fracture of unspecified thoracic vertebra, subsequent encounter for fracture with routine healing: Secondary | ICD-10-CM

## 2020-06-02 DIAGNOSIS — M5416 Radiculopathy, lumbar region: Secondary | ICD-10-CM

## 2020-06-02 MED ORDER — ULTRAM 50 MG PO TABS: 50.0000 mg | ORAL_TABLET | Freq: Four times a day (QID) | ORAL | 5 refills | Status: DC | PRN

## 2020-06-02 NOTE — Telephone Encounter (Signed)
I sent Ultram and even checked DAW and put note for brand only.  I resent it again

## 2020-06-02 NOTE — Telephone Encounter (Signed)
Pt states she thinks she is having side effects to tramadol and would like to try Ultram (Dispense as written).    Thanks,   -Mickel Baas

## 2020-06-02 NOTE — Telephone Encounter (Signed)
Patient states that tramadol was sent into pharmacy instead of Ultram. She states it was supposed to be Ultram.    Please advise.    [id number] [address], [place] Phone:  [phone]  Fax:  [phone]

## 2020-06-04 ENCOUNTER — Encounter: Payer: Self-pay | Admitting: Physician Assistant

## 2020-06-07 ENCOUNTER — Other Ambulatory Visit: Payer: Self-pay | Admitting: Physician Assistant

## 2020-06-07 DIAGNOSIS — I10 Essential (primary) hypertension: Secondary | ICD-10-CM

## 2020-06-07 NOTE — Telephone Encounter (Signed)
Requested medication (s) are due for refill today: yes  Requested medication (s) are on the active medication list: yes  Last refill:  11/29/19  Future visit scheduled: yes with health nurse   Notes to clinic:  Patient last visit 06/01/20. Last BP check 11/29/19  note from pharmacy Patient is out of medication.    Requested Prescriptions  Pending Prescriptions Disp Refills   verapamil (CALAN-SR) 180 MG CR tablet [Pharmacy Med Name: VERAPAMIL HCL ER 180 MG TAB] 45 tablet 1    Sig: TAKE 1/2 TABLET (90 MG TOTAL) BY MOUTH DAILY      Cardiovascular:  Calcium Channel Blockers Failed - 06/07/2020  9:33 AM      Failed - Valid encounter within last 6 months    Recent Outpatient Visits           6 days ago Age-related osteoporosis with current pathological fracture with routine healing, subsequent encounter   [place], PA-C   8 months ago Bronchiectasis without complication [facility])   [place], Clearnce [place], [place]   1 year ago Ear popping, left   [facility] [place], [place], [place]   1 year ago Nausea   [place], [place], [place]   1 year ago Moderate persistent asthma with acute exacerbation   [place], [place]              Passed - Last BP in normal range    BP Readings from Last 1 Encounters:  05/17/19 120/66

## 2020-06-08 ENCOUNTER — Other Ambulatory Visit: Payer: Self-pay | Admitting: Physician Assistant

## 2020-06-08 DIAGNOSIS — I10 Essential (primary) hypertension: Secondary | ICD-10-CM

## 2020-06-08 NOTE — Telephone Encounter (Signed)
Requested Prescriptions  Pending Prescriptions Disp Refills  . verapamil (CALAN-SR) 180 MG CR tablet [Pharmacy Med Name: VERAPAMIL HCL ER 180 MG TAB] 45 tablet 1    Sig: TAKE 1/2 TABLET (90 MG TOTAL) BY MOUTH DAILY     Cardiovascular:  Calcium Channel Blockers Failed - 06/08/2020  9:21 AM      Failed - Valid encounter within last 6 months    Recent Outpatient Visits          1 week ago Age-related osteoporosis with current pathological fracture with routine healing, subsequent encounter   [place], PA-C   8 months ago Bronchiectasis without complication [facility])   [place], [place], [place]   1 year ago Ear popping, left   [facility] [place], [place], [place]   1 year ago Nausea   [facility] [place], Fabio Bering M, [place]   1 year ago Moderate persistent asthma with acute exacerbation   [place], [place]             Passed - Last BP in normal range    BP Readings from Last 1 Encounters:  05/17/19 120/66         Rx was reordered on 06/07/20 per PCP, but transmission to pharmacy failed.  Resending Rx as was prescribed by PCP yesterday. (#45, RF x 1)

## 2020-06-12 ENCOUNTER — Other Ambulatory Visit: Payer: Self-pay

## 2020-06-12 ENCOUNTER — Encounter: Payer: Self-pay | Admitting: Emergency Medicine

## 2020-06-12 ENCOUNTER — Emergency Department: Payer: Medicare HMO

## 2020-06-12 ENCOUNTER — Inpatient Hospital Stay
Admission: EM | Admit: 2020-06-12 | Discharge: 2020-06-15 | DRG: 308 | Disposition: A | Discharge status: Home / Self Care | Payer: Medicare HMO | Source: Ambulatory Visit | Attending: Internal Medicine | Admitting: Internal Medicine

## 2020-06-12 ENCOUNTER — Ambulatory Visit (INDEPENDENT_AMBULATORY_CARE_PROVIDER_SITE_OTHER)
Admission: EM | Admit: 2020-06-12 | Discharge: 2020-06-12 | Disposition: A | Discharge status: Other Acute Inpatient Hospital | Payer: Medicare HMO | Source: Home / Self Care | Attending: Family Medicine | Admitting: Family Medicine

## 2020-06-12 DIAGNOSIS — R0602 Shortness of breath: Secondary | ICD-10-CM

## 2020-06-12 DIAGNOSIS — Z90710 Acquired absence of both cervix and uterus: Secondary | ICD-10-CM

## 2020-06-12 DIAGNOSIS — J9621 Acute and chronic respiratory failure with hypoxia: Secondary | ICD-10-CM | POA: Diagnosis present

## 2020-06-12 DIAGNOSIS — M79641 Pain in right hand: Secondary | ICD-10-CM | POA: Diagnosis present

## 2020-06-12 DIAGNOSIS — I2721 Secondary pulmonary arterial hypertension: Secondary | ICD-10-CM | POA: Diagnosis present

## 2020-06-12 DIAGNOSIS — Z20822 Contact with and (suspected) exposure to covid-19: Secondary | ICD-10-CM | POA: Diagnosis present

## 2020-06-12 DIAGNOSIS — J9601 Acute respiratory failure with hypoxia: Secondary | ICD-10-CM

## 2020-06-12 DIAGNOSIS — I4891 Unspecified atrial fibrillation: Principal | ICD-10-CM | POA: Diagnosis present

## 2020-06-12 DIAGNOSIS — N3281 Overactive bladder: Secondary | ICD-10-CM | POA: Diagnosis present

## 2020-06-12 DIAGNOSIS — J45901 Unspecified asthma with (acute) exacerbation: Secondary | ICD-10-CM

## 2020-06-12 DIAGNOSIS — J479 Bronchiectasis, uncomplicated: Secondary | ICD-10-CM

## 2020-06-12 DIAGNOSIS — I10 Essential (primary) hypertension: Secondary | ICD-10-CM | POA: Diagnosis present

## 2020-06-12 DIAGNOSIS — S6991XA Unspecified injury of right wrist, hand and finger(s), initial encounter: Secondary | ICD-10-CM | POA: Diagnosis not present

## 2020-06-12 DIAGNOSIS — R001 Bradycardia, unspecified: Secondary | ICD-10-CM | POA: Diagnosis present

## 2020-06-12 DIAGNOSIS — J449 Chronic obstructive pulmonary disease, unspecified: Secondary | ICD-10-CM

## 2020-06-12 DIAGNOSIS — J986 Disorders of diaphragm: Secondary | ICD-10-CM

## 2020-06-12 DIAGNOSIS — Z7982 Long term (current) use of aspirin: Secondary | ICD-10-CM

## 2020-06-12 DIAGNOSIS — W19XXXA Unspecified fall, initial encounter: Secondary | ICD-10-CM

## 2020-06-12 DIAGNOSIS — J441 Chronic obstructive pulmonary disease with (acute) exacerbation: Secondary | ICD-10-CM | POA: Diagnosis present

## 2020-06-12 DIAGNOSIS — E785 Hyperlipidemia, unspecified: Secondary | ICD-10-CM | POA: Diagnosis present

## 2020-06-12 DIAGNOSIS — J9611 Chronic respiratory failure with hypoxia: Secondary | ICD-10-CM

## 2020-06-12 DIAGNOSIS — Z8249 Family history of ischemic heart disease and other diseases of the circulatory system: Secondary | ICD-10-CM

## 2020-06-12 DIAGNOSIS — D509 Iron deficiency anemia, unspecified: Secondary | ICD-10-CM | POA: Diagnosis present

## 2020-06-12 DIAGNOSIS — Z79899 Other long term (current) drug therapy: Secondary | ICD-10-CM

## 2020-06-12 LAB — CBC
HCT: 34.4 % — ABNORMAL LOW (ref 36.0–46.0)
Hemoglobin: 10.8 g/dL — ABNORMAL LOW (ref 12.0–15.0)
MCH: 25 pg — ABNORMAL LOW (ref 26.0–34.0)
MCHC: 31.4 g/dL (ref 30.0–36.0)
MCV: 79.6 fL — ABNORMAL LOW (ref 80.0–100.0)
Platelets: 371 10*3/uL (ref 150–400)
RBC: 4.32 MIL/uL (ref 3.87–5.11)
RDW: 15.3 % (ref 11.5–15.5)
WBC: 6.8 10*3/uL (ref 4.0–10.5)
nRBC: 0 % (ref 0.0–0.2)

## 2020-06-12 LAB — FIBRIN DERIVATIVES D-DIMER (ARMC ONLY): Fibrin derivatives D-dimer (ARMC): 778.79 ng/mL (FEU) — ABNORMAL HIGH (ref 0.00–499.00)

## 2020-06-12 LAB — BASIC METABOLIC PANEL
Anion gap: 11 (ref 5–15)
BUN: 27 mg/dL — ABNORMAL HIGH (ref 8–23)
CO2: 30 mmol/L (ref 22–32)
Calcium: 9.1 mg/dL (ref 8.9–10.3)
Chloride: 101 mmol/L (ref 98–111)
Creatinine, Ser: 0.8 mg/dL (ref 0.44–1.00)
GFR calc Af Amer: >60 mL/min (ref >60)
GFR calc non Af Amer: >60 mL/min (ref >60)
Glucose, Bld: 93 mg/dL (ref 70–99)
Potassium: 4.5 mmol/L (ref 3.5–5.1)
Sodium: 142 mmol/L (ref 135–145)

## 2020-06-12 LAB — TROPONIN I (HIGH SENSITIVITY): Troponin I (High Sensitivity): 14 ng/L (ref <18)

## 2020-06-12 LAB — BRAIN NATRIURETIC PEPTIDE: B Natriuretic Peptide: 509.1 pg/mL — ABNORMAL HIGH (ref 0.0–100.0)

## 2020-06-12 MED ORDER — IOHEXOL 350 MG/ML SOLN
75.0000 mL | Freq: Once | INTRAVENOUS | Status: AC | PRN
  Administered 2020-06-12: 75 mL via INTRAVENOUS

## 2020-06-12 MED ORDER — PREDNISONE 20 MG PO TABS
60.0000 mg | ORAL_TABLET | Freq: Once | ORAL | Status: AC
  Administered 2020-06-12: 60 mg via ORAL
  Filled 2020-06-12: qty 3

## 2020-06-12 MED ORDER — DILTIAZEM HCL 25 MG/5ML IV SOLN
10.0000 mg | Freq: Once | INTRAVENOUS | Status: AC
  Administered 2020-06-12: 10 mg via INTRAVENOUS
  Filled 2020-06-12: qty 5

## 2020-06-12 MED ORDER — DILTIAZEM HCL-DEXTROSE 125-5 MG/125ML-% IV SOLN (PREMIX)
5.0000 mg/h | INTRAVENOUS | Status: DC
  Filled 2020-06-12: qty 125

## 2020-06-12 MED ORDER — IPRATROPIUM-ALBUTEROL 0.5-2.5 (3) MG/3ML IN SOLN
9.0000 mL | Freq: Once | RESPIRATORY_TRACT | Status: AC
  Administered 2020-06-12: 9 mL via RESPIRATORY_TRACT
  Filled 2020-06-12: qty 3

## 2020-06-12 NOTE — ED Triage Notes (Signed)
Patient reports while sitting in recliner today she fell asleep and struck her right hand. Right hand swollen. Patient went to urgent care to be seen for hand and when they checked her oxygen levels it was mid 80s. Patient reports she has been sob for awhile but had not really realized until today when she was told her oxygen level was low. A&O x4. She reports doing any activity makes her winded

## 2020-06-12 NOTE — ED Provider Notes (Addendum)
[facility] Emergency Department Provider Note   ____________________________________________   First MD Initiated Contact with Patient 06/12/20 2050     (approximate)  I have reviewed the triage vital signs and the nursing notes.   HISTORY  Chief Complaint Hand Pain and Shortness of Breath    HPI Jackie Turner is a 84 y.o. female with past medical history of asthma, bronchiectasis, spinal stenosis, and hyperlipidemia who presents to the ED complaining of hand pain and shortness of breath.  Patient reports that she initially started falling asleep in her recliner and tried to catch herself earlier this afternoon, inadvertently striking her hand on a table.  She did not hit her head or lose consciousness, but decided to get checked out at an urgent care due to pain in her right hand.  At that time, staff the urgent care noted patient was very out of breath after walking in from the parking lot.  They found her O2 sats to be 80% on room air and referred her to the ED for evaluation.  Patient admits that she has had increasing difficulty breathing over the past couple of months with a nonproductive cough.  She denies any fevers or chest pain, has not had any pain or swelling in her legs.  She states that she uses inhalers for asthma, but that they do not seem to help.        Past Medical History:  Diagnosis Date  . Allergy   . Asthma   . Hyperlipidemia   . Osteoporosis     Patient Active Problem List   Diagnosis Date Noted  . Bronchiectasis without complication ([place]) 65/79/0383  . Lymphedema 05/17/2019  . Chronic venous insufficiency 05/17/2019  . Swelling of limb 02/10/2018  . Varicose veins of leg with swelling, bilateral 02/10/2018  . Squamous cell carcinoma 09/22/2017  . Hypoxia 09/27/2015  . Abnormal CXR 09/27/2015  . Asthma with allergic rhinitis 08/31/2015  . Back ache 08/31/2015  . Chronic interstitial cystitis 08/31/2015  . Grief  reaction 08/31/2015  . Hyperlipidemia 06/26/2015  . Migraine 06/26/2015  . Asthma 06/26/2015  . IBS (irritable bowel syndrome) 06/26/2015  . Osteoporosis 06/26/2015  . Fatigue 06/26/2015  . Lumbar radiculopathy 04/27/2013  . Urinary urgency 01/08/2012  . Frequency of urination 01/08/2012    Past Surgical History:  Procedure Laterality Date  . ABDOMINAL HYSTERECTOMY  2003  . BREAST BIOPSY  1976, 1978   benign  . carpal tunnel repair Bilateral    R 1990; L 1997  . EYE SURGERY Bilateral    cataract extraction  . HAND TENDON SURGERY Right    Trigger finger  . IR RADIOLOGIST EVAL & MGMT  06/13/2017  . IR VERTEBROPLASTY CERV/THOR BX INC UNI/BIL INC/INJECT/IMAGING  06/17/2017  . TONSILLECTOMY  1961    Prior to Admission medications   Medication Sig Start Date End Date Taking? Authorizing Provider  Calcium Carbonate-Vitamin D 600-400 MG-UNIT per tablet Take 1 tablet by mouth 2 (two) times daily.  Patient not taking: Reported on 06/01/2020    [provider]  dicyclomine (BENTYL) 10 MG capsule TAKE 1 CAPSULE BY MOUTH 3 TIMES A DAY ASNEEDED FOR SPASMS 04/14/19   Mar Daring, PA-C  doxepin (SINEQUAN) 10 MG capsule TAKE 5 CAPSULES BY MOUTH DAILY Patient taking differently: TAKE 3 CAPSULES BY MOUTH DAILY 09/16/19   Mar Daring, PA-C  fluticasone (FLONASE) 50 MCG/ACT nasal spray Place into both nostrils. 06/09/20   [provider]  Fluticasone-Salmeterol (ADVAIR  DISKUS IN) Inhale into the lungs.    [provider]  Fluticasone-Umeclidin-Vilant (TRELEGY ELLIPTA) 100-62.5-25 MCG/INH AEPB Inhale 1 puff into the lungs daily. 10/01/19   Mar Daring, PA-C  nystatin (MYCOSTATIN) 100000 UNIT/ML suspension Take 5 mLs (500,000 Units total) by mouth 4 (four) times daily. 06/17/19   Trinna Post, PA-C  ondansetron (ZOFRAN) 4 MG tablet Take 1 tablet (4 mg total) by mouth every 8 (eight) hours as needed for nausea or vomiting. 10/01/19   Mar Daring, PA-C  oxybutynin (DITROPAN XL) 15 MG 24 hr tablet Take 15 mg by mouth at bedtime.  05/16/17   [provider]  pentosan polysulfate (ELMIRON) 100 MG capsule Take 200 mg by mouth 2 (two) times daily.  03/31/13   [provider]  Tiotropium Bromide Monohydrate (SPIRIVA RESPIMAT) 1.25 MCG/ACT AERS Inhale into the lungs.    [provider]  ULTRAM 50 MG tablet Take 1 tablet (50 mg total) by mouth every 6 (six) hours as needed. 06/02/20   Mar Daring, PA-C  urea (CARMOL) 40 % CREA Apply topically.    [provider]  VENTOLIN HFA 108 (90 Base) MCG/ACT inhaler INHALE TWO PUFFS INTO THE LUNGS EVERY 4 HOURS AS NEEDED FOR WHEEZING OR SHORTNESS OF BREATH 05/24/20   Mar Daring, PA-C  verapamil (CALAN-SR) 180 MG CR tablet TAKE 1/2 TABLET (90 MG TOTAL) BY MOUTH DAILY 06/08/20   Mar Daring, PA-C    Allergies Nsaids; Amoxicillin; Ibandronic acid; Actonel  [risedronate]; Antihistamines, chlorpheniramine-type; Aspirin; Buspar [buspirone]; Butalbital-aspirin-caffeine; Clarithromycin; Codeine; Gatifloxacin; Hydrocodone-guaifenesin; Moxifloxacin; Oxycodone; Penicillins; Risedronate sodium; Tylenol with codeine #3  [acetaminophen-codeine]; and Raloxifene  Family History  Problem Relation Age of Onset  . Heart disease Mother   . CAD Mother   . Congestive Heart Failure Mother   . Osteoarthritis Mother   . Cancer Sister        breast  . Breast cancer Sister     Social History Social History   Tobacco Use  . Smoking status: Never Smoker  . Smokeless tobacco: Never Used  Vaping Use  . Vaping Use: Never used  Substance Use Topics  . Alcohol use: No  . Drug use: No    Review of Systems  Constitutional: No fever/chills Eyes: No visual changes. ENT: No sore throat. Cardiovascular: Denies chest pain. Respiratory: Positive for cough and shortness of breath. Gastrointestinal: No abdominal pain.  No nausea, no vomiting.  No diarrhea.  No  constipation. Genitourinary: Negative for dysuria. Musculoskeletal: Negative for back pain.  Positive for right hand pain. Skin: Negative for rash. Neurological: Negative for headaches, focal weakness or numbness.  ____________________________________________   PHYSICAL EXAM:  VITAL SIGNS: ED Triage Vitals  Enc Vitals Group     BP 06/12/20 1627 (!) 156/88     Pulse Rate 06/12/20 1627 86     Resp 06/12/20 1627 (!) 28     Temp 06/12/20 1627 98.5 F (36.9 C)     Temp Source 06/12/20 1627 Oral     SpO2 06/12/20 1627 (!) 86 %     Weight 06/12/20 1628 115 lb (52.2 kg)     Height 06/12/20 1628 4\' 10"  (1.473 m)     Head Circumference --      Peak Flow --      Pain Score 06/12/20 1628 0     Pain Loc --      Pain Edu? --      Excl. in [place]? --  Constitutional: Alert and oriented. Eyes: Conjunctivae are normal. Head: Atraumatic. Nose: No congestion/rhinnorhea. Mouth/Throat: Mucous membranes are moist. Neck: Normal ROM Cardiovascular: Normal rate, regular rhythm. Grossly normal heart sounds. Respiratory: Tachypneic with normal respiratory effort.  No retractions. Lungs with inspiratory next Tory wheezing throughout. Gastrointestinal: Soft and nontender. No distention. Genitourinary: deferred Musculoskeletal: No lower extremity tenderness nor edema.  Mild soft tissue edema and tenderness to palpation to dorsum of right hand at the area of first and second MCP. Neurologic:  Normal speech and language. No gross focal neurologic deficits are appreciated. Skin:  Skin is warm, dry and intact. No rash noted. Psychiatric: Mood and affect are normal. Speech and behavior are normal.  ____________________________________________   LABS (all labs ordered are listed, but only abnormal results are displayed)  Labs Reviewed  BASIC METABOLIC PANEL - Abnormal; Notable for the following components:      Result Value   BUN 27 (*)    All other components within normal limits  CBC - Abnormal;  Notable for the following components:   Hemoglobin 10.8 (*)    HCT 34.4 (*)    MCV 79.6 (*)    MCH 25.0 (*)    All other components within normal limits  BRAIN NATRIURETIC PEPTIDE - Abnormal; Notable for the following components:   B Natriuretic Peptide 509.1 (*)    All other components within normal limits  FIBRIN DERIVATIVES D-DIMER ([facility] ONLY) - Abnormal; Notable for the following components:   Fibrin derivatives D-dimer ([facility]) 778.79 (*)    All other components within normal limits  TROPONIN I (HIGH SENSITIVITY)  TROPONIN I (HIGH SENSITIVITY)   ____________________________________________  EKG  ED ECG REPORT I, Blake Divine, the attending physician, personally viewed and interpreted this ECG.   Date: 06/12/2020  EKG Time: 16:28  Rate: 79  Rhythm: normal sinus rhythm  Axis: Normal  Intervals:none  ST&T Change: None  ED ECG REPORT I, Blake Divine, the attending physician, personally viewed and interpreted this ECG.   Date: 06/12/2020  EKG Time: 23:17  Rate: 130  Rhythm: atrial fibrillation, rate 130  Axis: Normal  Intervals:none  ST&T Change: None    PROCEDURES  Procedure(s) performed (including Critical Care):  .Critical Care Performed by: Blake Divine, MD Authorized by: Blake Divine, MD   Critical care provider statement:    Critical care time (minutes):  45   Critical care time was exclusive of:  Separately billable procedures and treating other patients and teaching time   Critical care was necessary to treat or prevent imminent or life-threatening deterioration of the following conditions:  Circulatory failure and respiratory failure   Critical care was time spent personally by me on the following activities:  Discussions with consultants, evaluation of patient's response to treatment, examination of patient, ordering and performing treatments and interventions, ordering and review of laboratory studies, ordering and review of radiographic  studies, pulse oximetry, re-evaluation of patient's condition, obtaining history from patient or surrogate and review of old charts   I assumed direction of critical care for this patient from another provider in my specialty: no       ____________________________________________   INITIAL IMPRESSION / ASSESSMENT AND PLAN / ED COURSE       84 year old female with past medical history of asthma and bronchiectasis presents to the ED for shortness of breath and hypoxia after she initially presented to urgent care for right hand pain.  She is mildly tachypneic with inspiratory next Tory wheezing throughout, but is not in any significant  respiratory distress.  She was noted to have O2 sats in the 80s on room air, subsequently placed on 2 L with improvement.  She endorses a cough but has not had any fevers or chest pain, chest x-ray is negative for acute process.  D-dimer obtained from triage is elevated and we will further assess with CTA to rule out PE.  Asthma exacerbation seems to be the more likely cause of her shortness of breath and we will treat with duo nebs and steroids, attempt to wean off oxygen.  EKG shows no acute ischemic changes and troponin within normal limits, doubt ACS.  No obvious deformity to her right hand, we will further assess with x-ray.  X-rays of right hand are negative for acute process.  CTA is pending and patient remained stable on 2 L nasal cannula.  Patient turned over to oncoming provider pending CTA results and reassessment, anticipate admission for asthma exacerbation and hypoxia.  On reassessment, patient continues to have some wheezing and require 2 L nasal cannula.  She has also developed atrial fibrillation with RVR, which she has no history of.  We will start her on Cardizem drip for rate control.  CT is negative for PE, shows chronic elevation of left hemidiaphragm with associated atelectasis.  Plan to discuss with hospitalist for admission.       ____________________________________________   FINAL CLINICAL IMPRESSION(S) / ED DIAGNOSES  Final diagnoses:  Exacerbation of persistent asthma, unspecified asthma severity  Bronchiectasis without complication ([place])  Pain of right hand     ED Discharge Orders    None       Note:  This document was prepared using Dragon voice recognition software and may include unintentional dictation errors.       Blake Divine, MD 06/12/20 2325

## 2020-06-12 NOTE — ED Triage Notes (Signed)
Pt reports falling asleep in the recliner this morning and fell forward striking her right hand. Also reports she is being monitored for her breathing but she went to her car and because of the heat it got much worse by the time she came back inside.

## 2020-06-12 NOTE — ED Provider Notes (Signed)
[facility]    CSN: [id number] Arrival date & time: 06/12/20  1411      History   Chief Complaint Chief Complaint  Patient presents with  . Hand Injury  . Shortness of Breath    HPI   84 year old female presents with the above complaints.  Patient states that she was began to fall and caught herself with her right hand on a table near the recliner.  She reports pain in the right hand particular around the MCP joint of the index finger.  While patient was waiting to be evaluated, she went out to her car and came back and was acutely short of breath.  Patient reports that this has been an ongoing issue although she is not sure of the exact timeframe.  Non-smoker.  Denies significant secondhand smoke history.  No other associated symptoms.  No other complaints.  Past Medical History:  Diagnosis Date  . Allergy   . Asthma   . Hyperlipidemia   . Osteoporosis     Patient Active Problem List   Diagnosis Date Noted  . Bronchiectasis without complication ([place]) 26/83/4196  . Lymphedema 05/17/2019  . Chronic venous insufficiency 05/17/2019  . Swelling of limb 02/10/2018  . Varicose veins of leg with swelling, bilateral 02/10/2018  . Squamous cell carcinoma 09/22/2017  . Hypoxia 09/27/2015  . Abnormal CXR 09/27/2015  . Asthma with allergic rhinitis 08/31/2015  . Back ache 08/31/2015  . Chronic interstitial cystitis 08/31/2015  . Grief reaction 08/31/2015  . Hyperlipidemia 06/26/2015  . Migraine 06/26/2015  . Asthma 06/26/2015  . IBS (irritable bowel syndrome) 06/26/2015  . Osteoporosis 06/26/2015  . Fatigue 06/26/2015  . Lumbar radiculopathy 04/27/2013  . Urinary urgency 01/08/2012  . Frequency of urination 01/08/2012    Past Surgical History:  Procedure Laterality Date  . ABDOMINAL HYSTERECTOMY  2003  . BREAST BIOPSY  1976, 1978   benign  . carpal tunnel repair Bilateral    R 1990; L 1997  . EYE SURGERY Bilateral    cataract extraction  . HAND TENDON  SURGERY Right    Trigger finger  . IR RADIOLOGIST EVAL & MGMT  06/13/2017  . IR VERTEBROPLASTY CERV/THOR BX INC UNI/BIL INC/INJECT/IMAGING  06/17/2017  . TONSILLECTOMY  1961    OB History   No obstetric history on file.      Home Medications    Prior to Admission medications   Medication Sig Start Date End Date Taking? Authorizing Provider  Fluticasone-Salmeterol (ADVAIR DISKUS IN) Inhale into the lungs.   Yes [provider]  Tiotropium Bromide Monohydrate (SPIRIVA RESPIMAT) 1.25 MCG/ACT AERS Inhale into the lungs.   Yes [provider]  Calcium Carbonate-Vitamin D 600-400 MG-UNIT per tablet Take 1 tablet by mouth 2 (two) times daily.  Patient not taking: Reported on 06/01/2020    [provider]  dicyclomine (BENTYL) 10 MG capsule TAKE 1 CAPSULE BY MOUTH 3 TIMES A DAY ASNEEDED FOR SPASMS 04/14/19   Mar Daring, PA-C  doxepin (SINEQUAN) 10 MG capsule TAKE 5 CAPSULES BY MOUTH DAILY Patient taking differently: TAKE 3 CAPSULES BY MOUTH DAILY 09/16/19   Mar Daring, PA-C  fluticasone (FLONASE) 50 MCG/ACT nasal spray Place into both nostrils. 06/09/20   [provider]  Fluticasone-Umeclidin-Vilant (TRELEGY ELLIPTA) 100-62.5-25 MCG/INH AEPB Inhale 1 puff into the lungs daily. 10/01/19   Mar Daring, PA-C  nystatin (MYCOSTATIN) 100000 UNIT/ML suspension Take 5 mLs (500,000 Units total) by mouth 4 (four) times daily. 06/17/19   Terrilee Croak,  Adriana M, PA-C  ondansetron (ZOFRAN) 4 MG tablet Take 1 tablet (4 mg total) by mouth every 8 (eight) hours as needed for nausea or vomiting. 10/01/19   Mar Daring, PA-C  oxybutynin (DITROPAN XL) 15 MG 24 hr tablet Take 15 mg by mouth at bedtime.  05/16/17   [provider]  pentosan polysulfate (ELMIRON) 100 MG capsule Take 200 mg by mouth 2 (two) times daily.  03/31/13   [provider]  ULTRAM 50 MG tablet Take 1 tablet (50 mg total) by mouth every 6 (six) hours as needed. 06/02/20    Mar Daring, PA-C  urea (CARMOL) 40 % CREA Apply topically.    [provider]  VENTOLIN HFA 108 (90 Base) MCG/ACT inhaler INHALE TWO PUFFS INTO THE LUNGS EVERY 4 HOURS AS NEEDED FOR WHEEZING OR SHORTNESS OF BREATH 05/24/20   Mar Daring, PA-C  verapamil (CALAN-SR) 180 MG CR tablet TAKE 1/2 TABLET (90 MG TOTAL) BY MOUTH DAILY 06/08/20   Mar Daring, PA-C    Family History Family History  Problem Relation Age of Onset  . Heart disease Mother   . CAD Mother   . Congestive Heart Failure Mother   . Osteoarthritis Mother   . Cancer Sister        breast  . Breast cancer Sister     Social History Social History   Tobacco Use  . Smoking status: Never Smoker  . Smokeless tobacco: Never Used  Vaping Use  . Vaping Use: Never used  Substance Use Topics  . Alcohol use: No  . Drug use: No     Allergies   Nsaids; Amoxicillin; Ibandronic acid; Actonel  [risedronate]; Antihistamines, chlorpheniramine-type; Aspirin; Buspar [buspirone]; Butalbital-aspirin-caffeine; Clarithromycin; Codeine; Gatifloxacin; Hydrocodone-guaifenesin; Moxifloxacin; Oxycodone; Penicillins; Risedronate sodium; Tylenol with codeine #3  [acetaminophen-codeine]; and Raloxifene   Review of Systems Review of Systems  Respiratory: Positive for shortness of breath.   Musculoskeletal:       Right hand pain.   Physical Exam Triage Vital Signs ED Triage Vitals  Enc Vitals Group     BP 06/12/20 1442 105/81     Pulse Rate 06/12/20 1442 65     Resp 06/12/20 1442 (!) 36     Temp 06/12/20 1442 98.4 F (36.9 C)     Temp Source 06/12/20 1442 Oral     SpO2 06/12/20 1442 (!) 87 %     Weight 06/12/20 1440 115 lb (52.2 kg)     Height 06/12/20 1440 4\' 10"  (1.473 m)     Head Circumference --      Peak Flow --      Pain Score 06/12/20 1439 10     Pain Loc --      Pain Edu? --      Excl. in [place]? --    Updated Vital Signs BP 105/81 (BP Location: Right Arm)   Pulse 65   Temp 98.4 F (36.9  C) (Oral)   Resp (!) 32   Ht 4\' 10"  (1.473 m)   Wt 52.2 kg   SpO2 95%   BMI 24.04 kg/m   Visual Acuity Right Eye Distance:   Left Eye Distance:   Bilateral Distance:    Right Eye Near:   Left Eye Near:    Bilateral Near:     Physical Exam Vitals and nursing note reviewed.  Constitutional:      Appearance: She is well-developed.     Comments: Pursed lip breathing at times.  HENT:  Head: Normocephalic and atraumatic.  Eyes:     General:        Right eye: No discharge.        Left eye: No discharge.     Conjunctiva/sclera: Conjunctivae normal.  Cardiovascular:     Rate and Rhythm: Normal rate and regular rhythm.  Pulmonary:     Comments: Present breathing noted intermittently.  Tachypnea. Crackles. Musculoskeletal:     Comments: Right hand with mild swelling over the MCP joint of the second digit.  Nontender to palpation.  Neurological:     Mental Status: She is alert.  Psychiatric:        Mood and Affect: Mood normal.        Behavior: Behavior normal.    UC Treatments / Results  Labs (all labs ordered are listed, but only abnormal results are displayed) Labs Reviewed - No data to display  EKG   Radiology No results found.  Procedures Procedures (including critical care time)  Medications Ordered in UC Medications - No data to display  Initial Impression / Assessment and Plan / UC Course  I have reviewed the triage vital signs and the nursing notes.  Pertinent labs & imaging results that were available during my care of the patient were reviewed by me and considered in my medical decision making (see chart for details).    84 year old female presents with right hand injury and shortness of breath.  While waiting to be seen, patient went to her vehicle and came back and was acutely short of breath.  Patient endorses ongoing shortness of breath and cannot tell me the exact timeframe for her shortness of breath.  Crackles on exam.  Hypoxic, 87-89 on  room air.  Placed on 2 L nasal cannula.  EMR reflects a history of bronchiectasis.  Patient going via ambulance to the hospital as she needs further evaluation and management as she is currently requiring oxygen.  Final Clinical Impressions(s) / UC Diagnoses   Final diagnoses:  Acute respiratory failure with hypoxia ([place])  Bronchiectasis without complication ([facility])  Hand injury, right, initial encounter   Discharge Instructions   None    ED Prescriptions    None     PDMP not reviewed this encounter.   Coral Spikes, [place] 06/12/20 1616

## 2020-06-12 NOTE — ED Provider Notes (Signed)
11:36 PM Assumed care for off going team.   Blood pressure (!) 162/98, pulse (!) 109, temperature 98.5 F (36.9 C), temperature source Oral, resp. rate 20, height 4\' 10"  (1.473 m), weight 52.2 kg, SpO2 97 %.  See their HPI for full report but in brief Fell asleep, fell out of recliner and hit her hand, 80% on room air in UC. 1 month of SOB. Nebs/steroids for wheezing. 2L [place]. CTA pending.    1. No pulmonary embolus. Dilated main pulmonary artery consistent  with pulmonary arterial hypertension.  2. Chronic but progressive elevation of the left hemidiaphragm.  Progressive left lung base consolidation, favor atelectasis related  to diaphragm elevation. Left lower lobe bronchiectasis is again seen  but primarily obscured.  3. Heterogeneous pulmonary parenchyma with areas of ground-glass  opacity. Favor small airways disease, however some of these areas  appear nodular. Recommend CT follow-up as a ground-glass nodule.  Recommend CT at 6-12 months to confirm persistence.  4. Right upper lobe 5 mm perifissural nodule may be solid but motion  obscures accurate characterization. Recommend attention to this at  follow-up.  5. Chronic bronchiectasis in the right middle lobe and lingula.   Updated family on CT results and needing repeat CT scan.  Will discuss with the hospital team for admission.       Vanessa [place], MD 06/12/20 2340

## 2020-06-13 ENCOUNTER — Encounter: Payer: Self-pay | Admitting: Internal Medicine

## 2020-06-13 ENCOUNTER — Other Ambulatory Visit: Payer: Self-pay

## 2020-06-13 ENCOUNTER — Inpatient Hospital Stay (HOSPITAL_COMMUNITY)
Admit: 2020-06-13 | Discharge: 2020-06-13 | Disposition: A | Discharge status: Home / Self Care | Payer: Medicare HMO | Attending: Internal Medicine | Admitting: Internal Medicine

## 2020-06-13 DIAGNOSIS — J9611 Chronic respiratory failure with hypoxia: Secondary | ICD-10-CM

## 2020-06-13 DIAGNOSIS — J479 Bronchiectasis, uncomplicated: Secondary | ICD-10-CM | POA: Diagnosis not present

## 2020-06-13 DIAGNOSIS — R001 Bradycardia, unspecified: Secondary | ICD-10-CM | POA: Diagnosis present

## 2020-06-13 DIAGNOSIS — I10 Essential (primary) hypertension: Secondary | ICD-10-CM | POA: Diagnosis present

## 2020-06-13 DIAGNOSIS — Z20822 Contact with and (suspected) exposure to covid-19: Secondary | ICD-10-CM | POA: Diagnosis present

## 2020-06-13 DIAGNOSIS — I4891 Unspecified atrial fibrillation: Secondary | ICD-10-CM | POA: Diagnosis present

## 2020-06-13 DIAGNOSIS — I2721 Secondary pulmonary arterial hypertension: Secondary | ICD-10-CM | POA: Diagnosis present

## 2020-06-13 DIAGNOSIS — M79641 Pain in right hand: Secondary | ICD-10-CM | POA: Diagnosis present

## 2020-06-13 DIAGNOSIS — J9621 Acute and chronic respiratory failure with hypoxia: Secondary | ICD-10-CM

## 2020-06-13 DIAGNOSIS — Y92009 Unspecified place in unspecified non-institutional (private) residence as the place of occurrence of the external cause: Secondary | ICD-10-CM

## 2020-06-13 DIAGNOSIS — J986 Disorders of diaphragm: Secondary | ICD-10-CM | POA: Diagnosis not present

## 2020-06-13 DIAGNOSIS — J449 Chronic obstructive pulmonary disease, unspecified: Secondary | ICD-10-CM

## 2020-06-13 DIAGNOSIS — I340 Nonrheumatic mitral (valve) insufficiency: Secondary | ICD-10-CM | POA: Diagnosis not present

## 2020-06-13 DIAGNOSIS — Z8249 Family history of ischemic heart disease and other diseases of the circulatory system: Secondary | ICD-10-CM | POA: Diagnosis not present

## 2020-06-13 DIAGNOSIS — Z90710 Acquired absence of both cervix and uterus: Secondary | ICD-10-CM | POA: Diagnosis not present

## 2020-06-13 DIAGNOSIS — E785 Hyperlipidemia, unspecified: Secondary | ICD-10-CM | POA: Diagnosis present

## 2020-06-13 DIAGNOSIS — J441 Chronic obstructive pulmonary disease with (acute) exacerbation: Secondary | ICD-10-CM | POA: Diagnosis present

## 2020-06-13 DIAGNOSIS — Z7982 Long term (current) use of aspirin: Secondary | ICD-10-CM | POA: Diagnosis not present

## 2020-06-13 DIAGNOSIS — W19XXXA Unspecified fall, initial encounter: Secondary | ICD-10-CM

## 2020-06-13 DIAGNOSIS — N3281 Overactive bladder: Secondary | ICD-10-CM | POA: Diagnosis present

## 2020-06-13 DIAGNOSIS — D509 Iron deficiency anemia, unspecified: Secondary | ICD-10-CM | POA: Diagnosis present

## 2020-06-13 DIAGNOSIS — Z79899 Other long term (current) drug therapy: Secondary | ICD-10-CM | POA: Diagnosis not present

## 2020-06-13 LAB — PROTIME-INR
INR: 1.1 (ref 0.8–1.2)
Prothrombin Time: 13.3 seconds (ref 11.4–15.2)

## 2020-06-13 LAB — CBC
HCT: 34.6 % — ABNORMAL LOW (ref 36.0–46.0)
Hemoglobin: 10.7 g/dL — ABNORMAL LOW (ref 12.0–15.0)
MCH: 24.6 pg — ABNORMAL LOW (ref 26.0–34.0)
MCHC: 30.9 g/dL (ref 30.0–36.0)
MCV: 79.5 fL — ABNORMAL LOW (ref 80.0–100.0)
Platelets: 379 10*3/uL (ref 150–400)
RBC: 4.35 MIL/uL (ref 3.87–5.11)
RDW: 15.4 % (ref 11.5–15.5)
WBC: 7.7 10*3/uL (ref 4.0–10.5)
nRBC: 0 % (ref 0.0–0.2)

## 2020-06-13 LAB — TSH: TSH: 0.937 u[IU]/mL (ref 0.350–4.500)

## 2020-06-13 LAB — APTT: aPTT: 27 seconds (ref 24–36)

## 2020-06-13 LAB — BASIC METABOLIC PANEL
Anion gap: 10 (ref 5–15)
BUN: 24 mg/dL — ABNORMAL HIGH (ref 8–23)
CO2: 30 mmol/L (ref 22–32)
Calcium: 8.8 mg/dL — ABNORMAL LOW (ref 8.9–10.3)
Chloride: 100 mmol/L (ref 98–111)
Creatinine, Ser: 0.83 mg/dL (ref 0.44–1.00)
GFR calc Af Amer: >60 mL/min (ref >60)
GFR calc non Af Amer: >60 mL/min (ref >60)
Glucose, Bld: 194 mg/dL — ABNORMAL HIGH (ref 70–99)
Potassium: 4.6 mmol/L (ref 3.5–5.1)
Sodium: 140 mmol/L (ref 135–145)

## 2020-06-13 LAB — ECHOCARDIOGRAM COMPLETE
Height: 58 in
Weight: 1840.01 oz

## 2020-06-13 LAB — SARS CORONAVIRUS 2 BY RT PCR (HOSPITAL ORDER, PERFORMED IN ~~LOC~~ HOSPITAL LAB): SARS Coronavirus 2: NEGATIVE

## 2020-06-13 MED ORDER — DILTIAZEM HCL 30 MG PO TABS
60.0000 mg | ORAL_TABLET | Freq: Four times a day (QID) | ORAL | Status: DC
  Administered 2020-06-13 – 2020-06-14 (×3): 60 mg via ORAL
  Filled 2020-06-13: qty 1
  Filled 2020-06-13 (×2): qty 2

## 2020-06-13 MED ORDER — ACETAMINOPHEN 325 MG PO TABS: 650.0000 mg | ORAL_TABLET | ORAL | Status: DC | PRN

## 2020-06-13 MED ORDER — MOMETASONE FURO-FORMOTEROL FUM 200-5 MCG/ACT IN AERO
1.0000 | INHALATION_SPRAY | Freq: Two times a day (BID) | RESPIRATORY_TRACT | Status: DC
  Administered 2020-06-13 – 2020-06-14 (×3): 1 via RESPIRATORY_TRACT
  Filled 2020-06-13 (×2): qty 8.8

## 2020-06-13 MED ORDER — TRAMADOL HCL 50 MG PO TABS
50.0000 mg | ORAL_TABLET | Freq: Four times a day (QID) | ORAL | Status: DC | PRN
  Administered 2020-06-13 – 2020-06-15 (×6): 50 mg via ORAL
  Filled 2020-06-13 (×6): qty 1

## 2020-06-13 MED ORDER — ONDANSETRON HCL 4 MG/2ML IJ SOLN: 4.0000 mg | Freq: Four times a day (QID) | INTRAMUSCULAR | Status: DC | PRN

## 2020-06-13 MED ORDER — LEVALBUTEROL HCL 1.25 MG/0.5ML IN NEBU
1.2500 mg | INHALATION_SOLUTION | Freq: Four times a day (QID) | RESPIRATORY_TRACT | Status: DC | PRN
  Filled 2020-06-13: qty 0.5

## 2020-06-13 MED ORDER — OXYBUTYNIN CHLORIDE ER 5 MG PO TB24
15.0000 mg | ORAL_TABLET | Freq: Every day | ORAL | Status: DC
  Administered 2020-06-13 – 2020-06-14 (×2): 15 mg via ORAL
  Filled 2020-06-13 (×4): qty 1

## 2020-06-13 MED ORDER — ENOXAPARIN SODIUM 60 MG/0.6ML ~~LOC~~ SOLN
50.0000 mg | Freq: Two times a day (BID) | SUBCUTANEOUS | Status: DC
  Administered 2020-06-13 (×2): 50 mg via SUBCUTANEOUS
  Filled 2020-06-13 (×3): qty 0.6

## 2020-06-13 MED ORDER — DOXYCYCLINE HYCLATE 100 MG PO TABS
100.0000 mg | ORAL_TABLET | Freq: Two times a day (BID) | ORAL | Status: DC
  Administered 2020-06-13 – 2020-06-15 (×5): 100 mg via ORAL
  Filled 2020-06-13 (×5): qty 1

## 2020-06-13 MED ORDER — ALBUTEROL SULFATE (2.5 MG/3ML) 0.083% IN NEBU
2.5000 mg | INHALATION_SOLUTION | RESPIRATORY_TRACT | Status: DC | PRN
Start: 2020-06-13 — End: 2020-06-13

## 2020-06-13 MED ORDER — METHYLPREDNISOLONE SODIUM SUCC 40 MG IJ SOLR: 40.0000 mg | Freq: Four times a day (QID) | INTRAMUSCULAR | Status: DC

## 2020-06-13 MED ORDER — ORAL CARE MOUTH RINSE
15.0000 mL | Freq: Two times a day (BID) | OROMUCOSAL | Status: DC
  Administered 2020-06-14 – 2020-06-15 (×2): 15 mL via OROMUCOSAL

## 2020-06-13 MED ORDER — DILTIAZEM HCL-DEXTROSE 125-5 MG/125ML-% IV SOLN (PREMIX)
5.0000 mg/h | INTRAVENOUS | Status: DC
  Administered 2020-06-13: 5 mg/h via INTRAVENOUS

## 2020-06-13 MED ORDER — APIXABAN 2.5 MG PO TABS
2.5000 mg | ORAL_TABLET | Freq: Two times a day (BID) | ORAL | Status: DC
  Administered 2020-06-13 – 2020-06-15 (×4): 2.5 mg via ORAL
  Filled 2020-06-13 (×4): qty 1

## 2020-06-13 MED ORDER — DOXEPIN HCL 10 MG PO CAPS
10.0000 mg | ORAL_CAPSULE | Freq: Four times a day (QID) | ORAL | Status: DC | PRN
  Filled 2020-06-13: qty 1

## 2020-06-13 MED ORDER — IPRATROPIUM BROMIDE 0.02 % IN SOLN
0.5000 mg | Freq: Four times a day (QID) | RESPIRATORY_TRACT | Status: DC
  Administered 2020-06-13 – 2020-06-14 (×3): 0.5 mg via RESPIRATORY_TRACT
  Filled 2020-06-13 (×3): qty 2.5

## 2020-06-13 MED ORDER — PREDNISONE 20 MG PO TABS: 40.0000 mg | ORAL_TABLET | Freq: Every day | ORAL | Status: DC

## 2020-06-13 MED ORDER — PREDNISONE 20 MG PO TABS
40.0000 mg | ORAL_TABLET | Freq: Every day | ORAL | Status: DC
  Administered 2020-06-13 – 2020-06-15 (×3): 40 mg via ORAL
  Filled 2020-06-13 (×3): qty 2

## 2020-06-13 NOTE — ED Notes (Signed)
Report off to gracie  rn  

## 2020-06-13 NOTE — Progress Notes (Signed)
*  PRELIMINARY RESULTS* Echocardiogram 2D Echocardiogram has been performed.  Jackie Turner 06/13/2020, 1:45 PM

## 2020-06-13 NOTE — ED Notes (Signed)
Pt given drink at this time, denies any other needs. VSS

## 2020-06-13 NOTE — ED Notes (Signed)
This RN assisted pt to bathroom. Pt is standby assist. Pt returned to bed with no difficulty. No further needs expressed by pt.

## 2020-06-13 NOTE — H&P (Signed)
History and Physical    Meggan Dhaliwal ZMO:[id number] DOB: [birth year] DOA: 06/12/2020  PCP: Mar Daring, PA-C   Patient coming from: Home  I have personally briefly reviewed patient's old medical records in [place]  Chief Complaint: Right hand pain, fall, hypoxia, sent from urgent care  HPI: Jackie Turner is a 84 y.o. female with medical history significant for COPD and bronchiectasis, who initially presented to the urgent care with right hand pain following a fall out of her recliner as she was falling asleep.  Patient was noted to be very dyspneic while walking in the clinic and they checked her O2 sat and it was in the 80s.  They put her on oxygen and sent her to the emergency room. Patient reports having increasing difficulty breathing over the past 3 months with a nonproductive cough and her inhalers were not helping.  She denies chest pain, fever or chills.  She denies pain or swelling in her legs ED Course: On arrival she was tachypneic at 28 with O2 sat 86% on room air.  Vitals were otherwise unremarkable.  Blood work significant for BNP of 509 and D-dimer of 778.  She had a CTA chest that was negative for acute PE that showed chronic bronchiectasis right middle lobe and lingula, progressive left lung base consolidation.  She was treated with bronchodilators and prednisone.  While in the ER her heart rate went upand EKG showed rapid A. fib.  She was treated with a diltiazem bolus and then started on an infusion.  Hospitalist consulted for admission Review of Systems: As per HPI otherwise all other systems on review of systems negative.    Past Medical History:  Diagnosis Date  . Allergy   . Asthma   . Hyperlipidemia   . Osteoporosis     Past Surgical History:  Procedure Laterality Date  . ABDOMINAL HYSTERECTOMY  2003  . BREAST BIOPSY  1976, 1978   benign  . carpal tunnel repair Bilateral    R 1990; L 1997  . EYE SURGERY Bilateral    cataract  extraction  . HAND TENDON SURGERY Right    Trigger finger  . IR RADIOLOGIST EVAL & MGMT  06/13/2017  . IR VERTEBROPLASTY CERV/THOR BX INC UNI/BIL INC/INJECT/IMAGING  06/17/2017  . TONSILLECTOMY  1961     reports that she has never smoked. She has never used smokeless tobacco. She reports that she does not drink alcohol and does not use drugs.  Allergies  Allergen Reactions  . Nsaids Other (See Comments)    History of gastric ulcer  . Amoxicillin Nausea Only and Rash  . Ibandronic Acid Other (See Comments)    Muscle weakness - advised not to take  **BONIVA** - drug name  . Actonel  [Risedronate] Other (See Comments)    Gastric ulcers  . Antihistamines, Chlorpheniramine-Type   . Aspirin     Gastric ulcer  . Buspar [Buspirone] Other (See Comments)    Pt does not remember reaction   . Butalbital-Aspirin-Caffeine Other (See Comments)    Other reaction(s): Unknown  . Clarithromycin Other (See Comments)    Bloating  *BIAXIN*  . Codeine   . Gatifloxacin Other (See Comments)  . Hydrocodone-Guaifenesin Nausea Only    CODICLEAR DH STYRUP   . Moxifloxacin   . Oxycodone Other (See Comments)    Dizziness  . Penicillins Other (See Comments)  . Risedronate Sodium     Gastric ulcer  . Tylenol With Codeine #3  [Acetaminophen-Codeine]  Other reaction(s): Unknown  . Raloxifene Other (See Comments)    Bloating Bloating  *EVISTA*    Family History  Problem Relation Age of Onset  . Heart disease Mother   . CAD Mother   . Congestive Heart Failure Mother   . Osteoarthritis Mother   . Cancer Sister        breast  . Breast cancer Sister       Prior to Admission medications   Medication Sig Start Date End Date Taking? Authorizing Provider  doxepin (SINEQUAN) 10 MG capsule 1 Capsule(s) By Mouth 4-5 Times Daily PRN   Yes [provider]  fluticasone (FLONASE) 50 MCG/ACT nasal spray Place into both nostrils. 06/09/20  Yes [provider]  Fluticasone-Salmeterol  (ADVAIR DISKUS) 250-50 MCG/DOSE AEPB Inhale 1 puff into the lungs daily.    Yes [provider]  Fluticasone-Umeclidin-Vilant (TRELEGY ELLIPTA) 100-62.5-25 MCG/INH AEPB Inhale 1 puff into the lungs daily. 10/01/19  Yes Mar Daring, PA-C  oxybutynin (DITROPAN XL) 15 MG 24 hr tablet Take 15 mg by mouth at bedtime.  05/16/17  Yes [provider]  Tiotropium Bromide Monohydrate (SPIRIVA RESPIMAT) 1.25 MCG/ACT AERS Inhale 2 puffs into the lungs daily.    Yes [provider]  ULTRAM 50 MG tablet Take 1 tablet (50 mg total) by mouth every 6 (six) hours as needed. 06/02/20  Yes Mar Daring, PA-C  VENTOLIN HFA 108 (90 Base) MCG/ACT inhaler INHALE TWO PUFFS INTO THE LUNGS EVERY 4 HOURS AS NEEDED FOR WHEEZING OR SHORTNESS OF BREATH 05/24/20  Yes Burnette, Clearnce Sorrel, PA-C  verapamil (CALAN-SR) 180 MG CR tablet TAKE 1/2 TABLET (90 MG TOTAL) BY MOUTH DAILY 06/08/20  Yes Mar Daring, [place]    Physical Exam: Vitals:   06/12/20 1628 06/12/20 1632 06/12/20 2158 06/12/20 2337  BP:   (!) 162/98 (!) 122/99  Pulse:   (!) 109   Resp:   20 (!) 24  Temp:      TempSrc:      SpO2:  96% 97%   Weight: 52.2 kg     Height: 4\' 10"  (1.473 m)        Vitals:   06/12/20 1628 06/12/20 1632 06/12/20 2158 06/12/20 2337  BP:   (!) 162/98 (!) 122/99  Pulse:   (!) 109   Resp:   20 (!) 24  Temp:      TempSrc:      SpO2:  96% 97%   Weight: 52.2 kg     Height: 4\' 10"  (1.473 m)         Constitutional: Alert and oriented x 3 .  Patient very dyspneic and speaking in short sentences, using accessory muscles HEENT:      Head: Normocephalic and atraumatic.         Eyes: PERLA, EOMI, Conjunctivae are normal. Sclera is non-icteric.       Mouth/Throat: Mucous membranes are moist.       Neck: Supple with no signs of meningismus. Cardiovascular:  Atrial fibrillation, rate in the 1 teens. No murmurs, gallops, or rubs. 2+ symmetrical distal pulses are present . No JVD. No LE  edema Respiratory:Patient very dyspneic and speaking in short sentences, using accessory muscles .  Breath sounds diminished bilaterally, bibasilar coarse crackles Gastrointestinal: Soft, non tender, and non distended with positive bowel sounds. No rebound or guarding. Genitourinary: No CVA tenderness. Musculoskeletal: Nontender with normal range of motion in all extremities. No cyanosis, or erythema of extremities. Neurologic: Normal speech and language.  Face is symmetric. Moving all extremities. No gross focal neurologic deficits . Skin: Skin is warm, dry.  No rash or ulcers Psychiatric: Mood and affect are normal Speech and behavior are normal   Labs on Admission: I have personally reviewed following labs and imaging studies  CBC: Recent Labs  Lab 06/12/20 1633  WBC 6.8  HGB 10.8*  HCT 34.4*  MCV 79.6*  PLT 967   Basic Metabolic Panel: Recent Labs  Lab 06/12/20 1633  NA 142  K 4.5  CL 101  CO2 30  GLUCOSE 93  BUN 27*  CREATININE 0.80  CALCIUM 9.1   GFR: Estimated Creatinine Clearance: 38.9 mL/min (by C-G formula based on SCr of 0.8 mg/dL). Liver Function Tests: No results for input(s): AST, ALT, ALKPHOS, BILITOT, PROT, ALBUMIN in the last 168 hours. No results for input(s): LIPASE, AMYLASE in the last 168 hours. No results for input(s): AMMONIA in the last 168 hours. Coagulation Profile: No results for input(s): INR, PROTIME in the last 168 hours. Cardiac Enzymes: No results for input(s): CKTOTAL, CKMB, CKMBINDEX, TROPONINI in the last 168 hours. BNP (last 3 results) No results for input(s): PROBNP in the last 8760 hours. HbA1C: No results for input(s): HGBA1C in the last 72 hours. CBG: No results for input(s): GLUCAP in the last 168 hours. Lipid Profile: No results for input(s): CHOL, HDL, LDLCALC, TRIG, CHOLHDL, LDLDIRECT in the last 72 hours. Thyroid Function Tests: No results for input(s): TSH, T4TOTAL, FREET4, T3FREE, THYROIDAB in the last 72  hours. Anemia Panel: No results for input(s): VITAMINB12, FOLATE, FERRITIN, TIBC, IRON, RETICCTPCT in the last 72 hours. Urine analysis:    Component Value Date/Time   COLORURINE YELLOW (A) 04/16/2017 1533   APPEARANCEUR HAZY (A) 04/16/2017 1533   LABSPEC 1.026 04/16/2017 1533   PHURINE 5.0 04/16/2017 1533   GLUCOSEU NEGATIVE 04/16/2017 1533   HGBUR NEGATIVE 04/16/2017 1533   BILIRUBINUR negative 12/31/2017 [place] 04/16/2017 1533   PROTEINUR negative 12/31/2017 1646   PROTEINUR NEGATIVE 04/16/2017 1533   UROBILINOGEN 0.2 12/31/2017 1646   NITRITE negative 12/31/2017 1646   NITRITE NEGATIVE 04/16/2017 1533   LEUKOCYTESUR Negative 12/31/2017 1646    Radiological Exams on Admission: DG Chest 2 View  Result Date: 06/12/2020 CLINICAL DATA:  Shortness of breath. EXAM: CHEST - 2 VIEW COMPARISON:  January 29, 2019. FINDINGS: Stable cardiomegaly. No pneumothorax or pleural effusion is noted. Stable elevation of left hemidiaphragm is noted. Minimal bibasilar subsegmental atelectasis is noted. Old lower thoracic compression fracture is noted. IMPRESSION: Minimal bibasilar subsegmental atelectasis. Electronically Signed   By: Marijo Conception M.D.   On: 06/12/2020 16:53   CT Angio Chest PE W/Cm &/Or Wo Cm  Result Date: 06/12/2020 CLINICAL DATA:  Shortness of breath. EXAM: CT ANGIOGRAPHY CHEST WITH CONTRAST TECHNIQUE: Multidetector CT imaging of the chest was performed using the standard protocol during bolus administration of intravenous contrast. Multiplanar CT image reconstructions and MIPs were obtained to evaluate the vascular anatomy. CONTRAST:  41mL OMNIPAQUE IOHEXOL 350 MG/ML SOLN COMPARISON:  Radiograph earlier this day.  Chest CT 11/13/2015 FINDINGS: Cardiovascular: There are no filling defects within the pulmonary arteries to suggest pulmonary embolus. There is dilatation of the main pulmonary artery of 4.4 cm. Tortuous atherosclerotic thoracic aorta without dissection  or acute aortic findings. Mild multi chamber cardiomegaly. No pericardial effusion. Mediastinum/Nodes: Prominent subcarinal node measuring 14 mm, likely reactive. No other enlarged mediastinal or hilar lymph nodes. No visualized thyroid nodule. Decompressed esophagus. Lungs/Pleura: Motion artifact limitations, particularly  through the apices and bases. Chronic but progressive elevation of the left hemidiaphragm. Left lower lobe bronchiectasis with progressive left lung base consolidation. Paramediastinal bronchiectasis in the right middle lobe, chronic. Heterogeneous pulmonary parenchyma with areas of ground-glass. Some of these ground-glass areas appear nodular, for example left upper lobe 7 mm nodular ground-glass opacities are ease 6 image 20. There is a perifissural nodule in the right upper lobe measuring 5 mm, series 6, image 27, that may be solid but motion obscures accurate characterization. Minimal adjacent perifissural consolidation in the right upper lobe. Upper Abdomen: Partially motion obscured. Elevated left hemidiaphragm. Musculoskeletal: Chronic T10 compression fracture with vertebral augmentation, however new from 2016. There is a mild T9 superior endplate compression fracture, chronic based on thoracic MRI 02/18/2018. No acute osseous abnormalities are seen. Review of the MIP images confirms the above findings. IMPRESSION: 1. No pulmonary embolus. Dilated main pulmonary artery consistent with pulmonary arterial hypertension. 2. Chronic but progressive elevation of the left hemidiaphragm. Progressive left lung base consolidation, favor atelectasis related to diaphragm elevation. Left lower lobe bronchiectasis is again seen but primarily obscured. 3. Heterogeneous pulmonary parenchyma with areas of ground-glass opacity. Favor small airways disease, however some of these areas appear nodular. Recommend CT follow-up as a ground-glass nodule. Recommend CT at 6-12 months to confirm persistence. 4. Right  upper lobe 5 mm perifissural nodule may be solid but motion obscures accurate characterization. Recommend attention to this at follow-up. 5. Chronic bronchiectasis in the right middle lobe and lingula. Aortic Atherosclerosis (ICD10-I70.0). Electronically Signed   By: Keith Rake M.D.   On: 06/12/2020 23:16   DG Hand 2 View Right  Result Date: 06/12/2020 CLINICAL DATA:  Fall EXAM: RIGHT HAND - 2 VIEW COMPARISON:  None. FINDINGS: No fracture or malalignment. Mild degenerative changes at the D IP and PIP joints. IMPRESSION: No acute osseous abnormality. Electronically Signed   By: Donavan Foil M.D.   On: 06/12/2020 21:42    EKG: Independently reviewed. Interpretation : rapid atrial fibrillation  Assessment/Plan  84 year old female with a history of bronchiectasis presenting from the urgent care with hypoxia and shortness of breath after initially presenting for right hand pain from a fall out of her recliner.    Acute on suspect chronic respiratory failure with hypoxia (HCC) -O2 sats in the 80s and patient appears dyspneic, with use of accessory muscles -Suspect underlying chronic respiratory failure related to progression of bronchiectasis as well as COPD.  CTA chest negative for acute PE -Supplemental oxygen to keep sats above 90  COPD exacerbation Bronchiectasis progression -Scheduled and as needed nebulized bronchodilators -IV Solu-Medrol  New onset atrial fibrillation with rapid ventricular response (HCC) -Continue diltiazem infusion -BNP was slightly elevated -Follow-up echocardiogram -CHA2DS2-VASc of 2 -Full dose Lovenox pending choice of a oral anticoagulant    Right hand pain   Fall at home, initial encounter -No acute injury on x-ray    DVT prophylaxis: Full dose Lovenox Code Status: full code  Family Communication:  none  Disposition Plan: Back to previous home environment Consults called: none  Status:At the time of admission, it appears that the appropriate  admission status for this patient is INPATIENT. This is judged to be reasonable and necessary in order to provide the required intensity of service to ensure the patient's safety given the presenting symptoms, physical exam findings, and initial radiographic and laboratory data in the context of their  Comorbid conditions.   Patient requires inpatient status due to high intensity of service, high risk for further  deterioration and high frequency of surveillance required.   I certify that at the point of admission it is my clinical judgment that the patient will require inpatient hospital care spanning beyond [place] MD [facility]     06/13/2020, 1:27 AM

## 2020-06-13 NOTE — Progress Notes (Addendum)
ANTICOAGULATION CONSULT NOTE - Initial Consult  Pharmacy Consult for lovenox Indication: atrial fibrillation  Allergies  Allergen Reactions  . Nsaids Other (See Comments)    History of gastric ulcer  . Amoxicillin Nausea Only and Rash  . Ibandronic Acid Other (See Comments)    Muscle weakness - advised not to take  **BONIVA** - drug name  . Actonel  [Risedronate] Other (See Comments)    Gastric ulcers  . Antihistamines, Chlorpheniramine-Type   . Aspirin     Gastric ulcer  . Buspar [Buspirone] Other (See Comments)    Pt does not remember reaction   . Butalbital-Aspirin-Caffeine Other (See Comments)    Other reaction(s): Unknown  . Clarithromycin Other (See Comments)    Bloating  *BIAXIN*  . Codeine   . Gatifloxacin Other (See Comments)  . Hydrocodone-Guaifenesin Nausea Only    CODICLEAR DH STYRUP   . Moxifloxacin   . Oxycodone Other (See Comments)    Dizziness  . Penicillins Other (See Comments)  . Risedronate Sodium     Gastric ulcer  . Tylenol With Codeine #3  [Acetaminophen-Codeine]     Other reaction(s): Unknown  . Raloxifene Other (See Comments)    Bloating Bloating  *EVISTA*    Patient Measurements: Height: 4\' 10"  (147.3 cm) Weight: 52.2 kg (115 lb) IBW/kg (Calculated) : 40.9  Vital Signs: Temp: 98.5 F (36.9 C) (06/28 1627) Temp Source: Oral (06/28 1627) BP: 122/99 (06/28 2337) Pulse Rate: 109 (06/28 2158)  Labs: Recent Labs    06/12/20 1633  HGB 10.8*  HCT 34.4*  PLT 371  CREATININE 0.80  TROPONINIHS 14    Estimated Creatinine Clearance: 38.9 mL/min (by C-G formula based on SCr of 0.8 mg/dL).   Medical History: Past Medical History:  Diagnosis Date  . Allergy   . Asthma   . Hyperlipidemia   . Osteoporosis     Medications:  Scheduled:  . enoxaparin (LOVENOX) injection  50 mg Subcutaneous Q12H    Assessment: Patient admitted for R hand pain and SOB found to be in afib w/ RVR w/ h/o asthma, HLD w/ CXR showing dilated pulmonary  arteries indicative of PAH. Admission CBC low compared to baseline (13 - 14/40 - 43). APTT/INR pending, no anticoagulation PTA. Patient is being started on lovenox for anticoagulation management of afib w/ RVR, CHADS-VASc = 3.  Goal of Therapy:  Anti-Xa level 0.6-1 units/ml 4hrs after LMWH dose given Monitor platelets by anticoagulation protocol: Yes   Plan:  Will start patient on lovenox 50 mg subq q12h (1 mg/kg q12h). Will not dose per anti-Xa considering patient does not have extremes of weight, or unstable renal function. Will continue to monitor daily CBC's and BMP for monitoring of renal function.  Tobie Lords, PharmD, BCPS Clinical Pharmacist 06/13/2020,2:42 AM

## 2020-06-13 NOTE — Progress Notes (Signed)
Made dr. Laurena Bering aware patient does not have a cardio consult coming in with new afib. md returned paged stated he will take care of it. Will continue to monitor

## 2020-06-13 NOTE — Progress Notes (Signed)
PROGRESS NOTE    Jackie Turner  OAC:[id number] DOB: [birth year] DOA: 06/12/2020 PCP: Mar Daring, PA-C    Brief Narrative:  This patient is a 84 year old female with history of chronic bronchiectasis with recently worsening exertional dyspnea, hypertension, on inhalers at home who went to urgent care center after falling on her right hand and hurting where she was found short of breath and sent to ER. In the emergency room, she was tachypneic with respiratory 28, oxygen 86% on room air.  BNP 509, D-dimer 778.  CTA chest was negative for PE that showed chronic bronchiectasis, right middle lobe and lingula and progressive left lung base consolidation that is probably chronic.  EKG showed rapid A. fib.  No known history of atrial fibrillation.   Assessment & Plan:   Principal Problem:   Acute on suspect chronic respiratory failure with hypoxia (HCC) Active Problems:   Bronchiectasis without complication (HCC)   Atrial fibrillation with rapid ventricular response (HCC)   Right hand pain   Fall at home, initial encounter   Rapid atrial fibrillation [facility] [place])   New onset atrial fibrillation ([place])   COPD with acute exacerbation ([place])  Acute hypoxic respiratory failure: Suspect progressive bronchiectasis and progressive lung disease. We will try a short course of doxycycline and prednisone, chest physiotherapy, bronchodilator therapy, continue steroid inhalers.  Once stabilized, will need ambulatory oxygen monitoring and probably will need home oxygen. She will need outpatient PFT, she was seen by pulmonology in the past, she will call their office and make an appointment.  New onset A. fib: Converted to sinus rhythm with Cardizem infusion.  Early morning, her heart rate was 58 and sinus on 5 mg of Cardizem drip.  Patient denies any chest pain or palpitations. TSH is normal. Will convert to oral Cardizem, discontinue home dose of verapamil. Lovenox is a started on  admission. CHA2DS2-VASc Score = 4  The patient's score is based upon: CHF History: 0 HTN History: 1 Age : 2 Diabetes History: 0 Stroke History: 0 Vascular Disease History: 0 Gender: 1    Right hand pain: Traumatic.  No fracture.  Improved.  Hypertension: Blood pressure stable.  Restarting Cardizem.  Discontinue verapamil.  Hyperactive bladder: Resume home medication including oxytocin and doxepin.    DVT prophylaxis: Lovenox subcu   Code Status: Full code Family Communication: None at bedside Disposition Plan: Status is: Inpatient  Remains inpatient appropriate because:Ongoing diagnostic testing needed not appropriate for outpatient work up   Dispo: The patient is from: Home              Anticipated d/c is to: Home              Anticipated d/c date is: 2 days              Patient currently is not medically stable to d/c.         Consultants:   None  Procedures:   None  Antimicrobials:  Antibiotics Given (last 72 hours)    Date/Time Action Medication Dose   06/13/20 0911 Given   doxycycline (VIBRA-TABS) tablet 100 mg 100 mg         Subjective: Patient was seen and examined.  She is still in the emergency room.  She is complaining of urinary symptoms and frequent micturition since being in the ER.  She needs her home medication including doxepin and oxytocin. Denies any shortness of breath.  Currently on 2 L oxygen.  She explains to me that her shortness  of breath is ongoing for at least 1 year and because of pandemic she was staying home and trying to avoid traveling or going to doctor's office.  Objective: Vitals:   06/13/20 0814 06/13/20 0815 06/13/20 0900 06/13/20 1230  BP: (!) 144/68 (!) 141/69 (!) 143/60 133/79  Pulse:    (!) 58  Resp: (!) 26 (!) 23 (!) 22 (!) 21  Temp:      TempSrc:      SpO2:    97%  Weight:      Height:       No intake or output data in the 24 hours ending 06/13/20 1402 Filed Weights   06/12/20 1628  Weight: 52.2 kg     Examination:  General exam: Appears calm and comfortable, appropriate for age. Respiratory system: Clear to auscultation. Respiratory effort normal.  On 2 L oxygen.  No added sounds. Cardiovascular system: S1 & S2 heard, RRR. No JVD, murmurs, rubs, gallops or clicks. No pedal edema. Gastrointestinal system: Abdomen is nondistended, soft and nontender. No organomegaly or masses felt. Normal bowel sounds heard. Central nervous system: Alert and oriented. No focal neurological deficits. Extremities: Symmetric 5 x 5 power.   Data Reviewed: I have personally reviewed following labs and imaging studies  CBC: Recent Labs  Lab 06/12/20 1633 06/13/20 0549  WBC 6.8 7.7  HGB 10.8* 10.7*  HCT 34.4* 34.6*  MCV 79.6* 79.5*  PLT 371 914   Basic Metabolic Panel: Recent Labs  Lab 06/12/20 1633 06/13/20 0549  NA 142 140  K 4.5 4.6  CL 101 100  CO2 30 30  GLUCOSE 93 194*  BUN 27* 24*  CREATININE 0.80 0.83  CALCIUM 9.1 8.8*   GFR: Estimated Creatinine Clearance: 37.5 mL/min (by C-G formula based on SCr of 0.83 mg/dL). Liver Function Tests: No results for input(s): AST, ALT, ALKPHOS, BILITOT, PROT, ALBUMIN in the last 168 hours. No results for input(s): LIPASE, AMYLASE in the last 168 hours. No results for input(s): AMMONIA in the last 168 hours. Coagulation Profile: Recent Labs  Lab 06/13/20 0100  INR 1.1   Cardiac Enzymes: No results for input(s): CKTOTAL, CKMB, CKMBINDEX, TROPONINI in the last 168 hours. BNP (last 3 results) No results for input(s): PROBNP in the last 8760 hours. HbA1C: No results for input(s): HGBA1C in the last 72 hours. CBG: No results for input(s): GLUCAP in the last 168 hours. Lipid Profile: No results for input(s): CHOL, HDL, LDLCALC, TRIG, CHOLHDL, LDLDIRECT in the last 72 hours. Thyroid Function Tests: Recent Labs    06/13/20 0549  TSH 0.937   Anemia Panel: No results for input(s): VITAMINB12, FOLATE, FERRITIN, TIBC, IRON, RETICCTPCT in  the last 72 hours. Sepsis Labs: No results for input(s): PROCALCITON, LATICACIDVEN in the last 168 hours.  Recent Results (from the past 240 hour(s))  SARS Coronavirus 2 by RT PCR (hospital order, performed in [facility] hospital lab) Nasopharyngeal Nasopharyngeal Swab     Status: None   Collection Time: 06/13/20 12:03 AM   Specimen: Nasopharyngeal Swab  Result Value Ref Range Status   SARS Coronavirus 2 NEGATIVE NEGATIVE Final    Comment: (NOTE) SARS-CoV-2 target nucleic acids are NOT DETECTED.  The SARS-CoV-2 RNA is generally detectable in upper and lower respiratory specimens during the acute phase of infection. The lowest concentration of SARS-CoV-2 viral copies this assay can detect is 250 copies / mL. A negative result does not preclude SARS-CoV-2 infection and should not be used as the sole basis for treatment or other patient  management decisions.  A negative result may occur with improper specimen collection / handling, submission of specimen other than nasopharyngeal swab, presence of viral mutation(s) within the areas targeted by this assay, and inadequate number of viral copies (<250 copies / mL). A negative result must be combined with clinical observations, patient history, and epidemiological information.  Fact Sheet for Patients:   StrictlyIdeas.no  Fact Sheet for Healthcare Providers: BankingDealers.co.za  This test is not yet approved or  cleared by the [place] FDA and has been authorized for detection and/or diagnosis of SARS-CoV-2 by FDA under an Emergency Use Authorization (EUA).  This EUA will remain in effect (meaning this test can be used) for the duration of the COVID-19 declaration under Section 564(b)(1) of the Act, 21 U.S.C. section 360bbb-3(b)(1), unless the authorization is terminated or revoked sooner.  Performed at [facility], [address] [place], [place] [address]           Radiology Studies: DG Chest 2 View  Result Date: 06/12/2020 CLINICAL DATA:  Shortness of breath. EXAM: CHEST - 2 VIEW COMPARISON:  January 29, 2019. FINDINGS: Stable cardiomegaly. No pneumothorax or pleural effusion is noted. Stable elevation of left hemidiaphragm is noted. Minimal bibasilar subsegmental atelectasis is noted. Old lower thoracic compression fracture is noted. IMPRESSION: Minimal bibasilar subsegmental atelectasis. Electronically Signed   By: Marijo Conception M.D.   On: 06/12/2020 16:53   CT Angio Chest PE W/Cm &/Or Wo Cm  Result Date: 06/12/2020 CLINICAL DATA:  Shortness of breath. EXAM: CT ANGIOGRAPHY CHEST WITH CONTRAST TECHNIQUE: Multidetector CT imaging of the chest was performed using the standard protocol during bolus administration of intravenous contrast. Multiplanar CT image reconstructions and MIPs were obtained to evaluate the vascular anatomy. CONTRAST:  76mL OMNIPAQUE IOHEXOL 350 MG/ML SOLN COMPARISON:  Radiograph earlier this day.  Chest CT 11/13/2015 FINDINGS: Cardiovascular: There are no filling defects within the pulmonary arteries to suggest pulmonary embolus. There is dilatation of the main pulmonary artery of 4.4 cm. Tortuous atherosclerotic thoracic aorta without dissection or acute aortic findings. Mild multi chamber cardiomegaly. No pericardial effusion. Mediastinum/Nodes: Prominent subcarinal node measuring 14 mm, likely reactive. No other enlarged mediastinal or hilar lymph nodes. No visualized thyroid nodule. Decompressed esophagus. Lungs/Pleura: Motion artifact limitations, particularly through the apices and bases. Chronic but progressive elevation of the left hemidiaphragm. Left lower lobe bronchiectasis with progressive left lung base consolidation. Paramediastinal bronchiectasis in the right middle lobe, chronic. Heterogeneous pulmonary parenchyma with areas of ground-glass. Some of these ground-glass areas appear nodular, for example left upper lobe  7 mm nodular ground-glass opacities are ease 6 image 20. There is a perifissural nodule in the right upper lobe measuring 5 mm, series 6, image 27, that may be solid but motion obscures accurate characterization. Minimal adjacent perifissural consolidation in the right upper lobe. Upper Abdomen: Partially motion obscured. Elevated left hemidiaphragm. Musculoskeletal: Chronic T10 compression fracture with vertebral augmentation, however new from 2016. There is a mild T9 superior endplate compression fracture, chronic based on thoracic MRI 02/18/2018. No acute osseous abnormalities are seen. Review of the MIP images confirms the above findings. IMPRESSION: 1. No pulmonary embolus. Dilated main pulmonary artery consistent with pulmonary arterial hypertension. 2. Chronic but progressive elevation of the left hemidiaphragm. Progressive left lung base consolidation, favor atelectasis related to diaphragm elevation. Left lower lobe bronchiectasis is again seen but primarily obscured. 3. Heterogeneous pulmonary parenchyma with areas of ground-glass opacity. Favor small airways disease, however some of these areas appear nodular. Recommend CT  follow-up as a ground-glass nodule. Recommend CT at 6-12 months to confirm persistence. 4. Right upper lobe 5 mm perifissural nodule may be solid but motion obscures accurate characterization. Recommend attention to this at follow-up. 5. Chronic bronchiectasis in the right middle lobe and lingula. Aortic Atherosclerosis (ICD10-I70.0). Electronically Signed   By: Keith Rake M.D.   On: 06/12/2020 23:16   DG Hand 2 View Right  Result Date: 06/12/2020 CLINICAL DATA:  Fall EXAM: RIGHT HAND - 2 VIEW COMPARISON:  None. FINDINGS: No fracture or malalignment. Mild degenerative changes at the D IP and PIP joints. IMPRESSION: No acute osseous abnormality. Electronically Signed   By: Donavan Foil M.D.   On: 06/12/2020 21:42        Scheduled Meds: . diltiazem  60 mg Oral Q6H  .  doxycycline  100 mg Oral Q12H  . enoxaparin (LOVENOX) injection  50 mg Subcutaneous Q12H  . ipratropium  0.5 mg Nebulization Q6H  . mometasone-formoterol  1 puff Inhalation BID  . oxybutynin  15 mg Oral QHS  . predniSONE  40 mg Oral Q breakfast   Continuous Infusions:   LOS: 0 days    Time spent: Additional 30 minutes. Further diagnostic studies were done, reviewed. A referral was made to A. fib clinic, ambulatory referral sent to pulmonary clinic.    Barb Merino, MD [facility] Pager [phone]

## 2020-06-13 NOTE — ED Notes (Signed)
afib at 120.  Pt alert iv meds infusing.  Family with pt

## 2020-06-13 NOTE — Evaluation (Signed)
Occupational Therapy Evaluation Patient Details Name: Jackie Turner MRN: [id number] DOB: 1936/11/30 Today's Date: 06/13/2020    History of Present Illness Jackie Turner is a 84 y.o. female with past medical history of asthma, bronchiectasis, spinal stenosis, osteoporosis s/p compression fracture x2, and HLD who presents to the ED complaining of hand pain and presenting with insidious hypoxia without SOB.   Clinical Impression   Jackie Turner was seen for OT evaluation this date. Pt was independent in all ADL and functional mobility, living alone in a 1 story home with 3 STE. Pt denies use of supplemental oxygen at baseline, but reports several month history of increased use of rescue inhalers and occasional "dizzy spells". Pt currently requires supervision for safety during exertional activities due to current functional impairments (See OT Problem List below). Pt educated in energy conservation strategies including pursed lip breathing, activity pacing, and falls prevention strategies. Handout provided. Pt return verbalized understanding and would benefit from additional skilled OT services to maximize recall and carryover of learned techniques and facilitate implementation of learned techniques into daily routines. Upon discharge, recommend pt attend a pulmonary rehab program such as [facility] program at [facility]. Do not anticipate need for follow-up OT services at this time.     Follow Up Recommendations  Other (comment) (Pulmonary rehab program such as [facility])    Equipment Recommendations  3 in 1 bedside commode    Recommendations for Other Services       Precautions / Restrictions Precautions Precautions: None Restrictions Weight Bearing Restrictions: No      Mobility Bed Mobility Overal bed mobility: Modified Independent             General bed mobility comments: Deferred. Pt fatigued from PT session, politely declines mobility during session. Per physical  therapy note, pt MOD I for bed mobility. I for functional mobility.  Transfers Overall transfer level: Independent Equipment used: None                  Balance Overall balance assessment: No apparent balance deficits (not formally assessed)                                         ADL either performed or assessed with clinical judgement   ADL Overall ADL's : Needs assistance/impaired                                       General ADL Comments: Pt functionally limited by cardiopulmonary status. Supervision for safety for all ADL tasks. She desats into 80's with mild exertion and requires modeling/VCs to implement energy conservation strategies.     Vision Baseline Vision/History: Wears glasses Wears Glasses: Distance only Patient Visual Report: No change from baseline       Perception     Praxis      Pertinent Vitals/Pain Pain Assessment: No/denies pain     Hand Dominance Right   Extremity/Trunk Assessment Upper Extremity Assessment Upper Extremity Assessment: Overall WFL for tasks assessed (Mild bruising between 2nd & 3rd MCP on R hand. Pt reports pain has generally resolved, and she is able to use RUE without issue.)   Lower Extremity Assessment Lower Extremity Assessment: Overall WFL for tasks assessed;Generalized weakness   Cervical / Trunk Assessment Cervical / Trunk Assessment: Normal   Communication  Communication Communication: No difficulties   Cognition Arousal/Alertness: Awake/alert Behavior During Therapy: WFL for tasks assessed/performed Overall Cognitive Status: Within Functional Limits for tasks assessed                                 General Comments: Pt pleasant, conversational t/o session. Follows multistep VCs consistently.   General Comments  Pt spO2 noted to drop to 88% with pt at rest conversing with therapist. Pt on 3L [place] t/o session. Pt educated on PLB strategies and with cueing  implements during session. SpO2 rebounds to 96-98% at end of session.    Exercises Other Exercises Other Exercises: Pt & caregiver (friend at bedside) educated on falls prevention strategies, safe use of AE/DME for ADL management, and energy conservation strategies including activity pacing and pursed lip breathing. Handout provided. Pt would benefit from further review.   Shoulder Instructions      Home Living Family/patient expects to be discharged to:: Private residence Living Arrangements: Alone Available Help at Discharge: Family;Friend(s);Available PRN/intermittently Type of Home: House Home Access: Stairs to enter [employer] of Steps: 3 Entrance Stairs-Rails: Right Home Layout: One level     Bathroom Shower/Tub: Walk-in shower;Door   [employer] Toilet: Standard     Home Equipment: Kasandra Knudsen - single point;Walker - 4 wheels;Shower seat;Hand held shower head          Prior Functioning/Environment Level of Independence: Independent        Comments: Still drives, grocery shops, cooks; sleeps in recliner due to back pain x3 years. Denies additional falls hx in past year.        OT Problem List: Decreased strength;Cardiopulmonary status limiting activity;Decreased activity tolerance;Decreased safety awareness;Decreased knowledge of use of DME or AE      OT Treatment/Interventions: Self-care/ADL training;Therapeutic exercise;Therapeutic activities;DME and/or AE instruction;Energy conservation;Patient/family education    OT Goals(Current goals can be found in the care plan section) Acute Rehab OT Goals Patient Stated Goal: return to home with normal lung function OT Goal Formulation: With patient Time For Goal Achievement: 06/27/20 Potential to Achieve Goals: Good ADL Goals Pt Will Transfer to Toilet: with modified independence;ambulating;regular height toilet (C LRAD PRN for improved safety.) Pt Will Perform Toileting - Clothing Manipulation and hygiene: sit  to/from stand;with modified independence (c LRAD PRN for improved safety) Additional ADL Goal #1: Pt will independently verbalize a plan to implement at least 3 learned energy conservation strategies into her daily routines/home environment for improved safety and fxl indep. upon hospital DC.  OT Frequency: Min 1X/week   Barriers to D/C: Decreased caregiver support          Co-evaluation              AM-PAC OT "6 Clicks" Daily Activity     Outcome Measure Help from another person eating meals?: None Help from another person taking care of personal grooming?: None Help from another person toileting, which includes using toliet, bedpan, or urinal?: A Little Help from another person bathing (including washing, rinsing, drying)?: A Little Help from another person to put on and taking off regular upper body clothing?: A Little Help from another person to put on and taking off regular lower body clothing?: A Little 6 Click Score: 20   End of Session Equipment Utilized During Treatment: Oxygen  Activity Tolerance: Patient tolerated treatment well Patient left: in bed;with call bell/phone within reach;with family/visitor present (In gurney bed.)  OT Visit Diagnosis: Other abnormalities  of gait and mobility (R26.89)                Time: 1441-1459 OT Time Calculation (min): 18 min Charges:  OT General Charges $OT Visit: 1 Visit OT Evaluation $OT Eval Low Complexity: 1 Low OT Treatments $Self Care/Home Management : 8-22 mins  Shara Blazing, M.S., OTR/L Ascom: [phone] 06/13/20, 4:01 PM

## 2020-06-13 NOTE — ED Notes (Signed)
Pt asleep in bed with family at bedside. VSS

## 2020-06-13 NOTE — Evaluation (Signed)
Physical Therapy Evaluation Patient Details Name: Jackie Turner MRN: [id number] DOB: [birth year] Today's Date: 06/13/2020   History of Present Illness  Jackie Turner is a 84 y.o. female with past medical history of asthma, bronchiectasis, spinal stenosis, osteoporosis s/p compression fracture x2, and HLD who presents to the ED complaining of hand pain and presenting with insidious hypoxia without SOB.  Clinical Impression  Pt admitted with above diagnosis. Pt currently with functional limitations due to the deficits listed below (see "PT Problem List"). Upon entry, pt in bed, awake and agreeable to participate. O2 donned with adequate saturations, but trial on room air leads to desaturation intermittently. Pt AMB a lap around unit with desaturation to low 80% SpO2, no symptoms of this. Same walk again on 3L/min show saturation 94% or greater upon return to room. Pt denies any acute abnormality in gross mobility, no weakness, unsteadiness, altered sensorium. Pt performs all mobility without assistive device, modified independent level or greater. Functional mobility assessment demonstrates increased effort/time requirements, poor tolerance, and need for physical assistance, whereas the patient performed these at a higher level of independence PTA. Pt will benefit from skilled PT intervention to increase independence and safety with basic mobility in preparation for discharge to the venue listed below.       Follow Up Recommendations No PT follow up    Equipment Recommendations  None recommended by PT    Recommendations for Other Services       Precautions / Restrictions Precautions Precautions: None Restrictions Weight Bearing Restrictions: No      Mobility  Bed Mobility Overal bed mobility: Modified Independent             General bed mobility comments: high ED gurney; elevated HOB due to chronic supine intolerance.  Transfers Overall transfer level:  Independent Equipment used: None                Ambulation/Gait   Gait Distance (Feet): 200 Feet (2x on room air, then 3L/min; 79% and 94% respectively) Assistive device: None Gait Pattern/deviations: WFL(Within Functional Limits)     General Gait Details: hypoxia on room air, but no dyspnea, no acute impairment, no LOB.  Stairs            Wheelchair Mobility    Modified Rankin (Stroke Patients Only)       Balance Overall balance assessment: Independent;No apparent balance deficits (not formally assessed)                                           Pertinent Vitals/Pain Pain Assessment: No/denies pain    Home Living Family/patient expects to be discharged to:: Private residence Living Arrangements: Alone Available Help at Discharge: Family Type of Home: House Home Access: Stairs to enter Entrance Stairs-Rails: Right Entrance Stairs-Number of Steps: 3 Home Layout: One level [place] - single point;Walker - 4 wheels;Shower seat      Prior Function Level of Independence: Independent         Comments: Still drives, grocery shops, cooks; sleeps in recliner due to back pain x3 years.     Hand Dominance   Dominant Hand: Right    Extremity/Trunk Assessment   Upper Extremity Assessment Upper Extremity Assessment: Overall WFL for tasks assessed (bruised 2nd, 3rd MTP bruising)    Lower Extremity Assessment Lower Extremity Assessment: Overall WFL for tasks assessed    Cervical /  Trunk Assessment Cervical / Trunk Assessment: Normal  Communication      Cognition Arousal/Alertness: Awake/alert Behavior During Therapy: WFL for tasks assessed/performed Overall Cognitive Status: Within Functional Limits for tasks assessed                                        General Comments      Exercises     Assessment/Plan    PT Assessment Patient needs continued PT services  PT Problem List Cardiopulmonary  status limiting activity       PT Treatment Interventions Patient/family education;Therapeutic activities;Balance training;Gait training;DME instruction    PT Goals (Current goals can be found in the Care Plan section)  Acute Rehab PT Goals Patient Stated Goal: return to home with normal lung function PT Goal Formulation: With patient Time For Goal Achievement: 06/27/20 Potential to Achieve Goals: Fair    Frequency Min 2X/week   Barriers to discharge        Co-evaluation               AM-PAC PT "6 Clicks" Mobility  Outcome Measure Help needed turning from your back to your side while in a flat bed without using bedrails?: None Help needed moving from lying on your back to sitting on the side of a flat bed without using bedrails?: None Help needed moving to and from a bed to a chair (including a wheelchair)?: None Help needed standing up from a chair using your arms (e.g., wheelchair or bedside chair)?: None Help needed to walk in hospital room?: A Little Help needed climbing 3-5 steps with a railing? : A Little 6 Click Score: 22    End of Session Equipment Utilized During Treatment: Gait belt Activity Tolerance: Patient tolerated treatment well;No increased pain Patient left: in bed;with family/visitor present;with call bell/phone within reach Nurse Communication: Mobility status PT Visit Diagnosis: Other abnormalities of gait and mobility (R26.89)    Time: 9381-8299 PT Time Calculation (min) (ACUTE ONLY): 24 min   Charges:   PT Evaluation $PT Eval Low Complexity: 1 Low PT Treatments $Therapeutic Exercise: 8-22 mins        3:30 PM, 06/13/20 Jackie Turner, PT, DPT Physical Therapist - [facility]  [phone] ([place])    [place] C 06/13/2020, 3:27 PM

## 2020-06-13 NOTE — ED Notes (Signed)
Walked pt back from restroom and hooked her up to oxygen and monitor. Gave pt water to drink with remote for TV.

## 2020-06-13 NOTE — Progress Notes (Signed)
OT Cancellation Note  Patient Details Name: Jackie Turner MRN: [id number] DOB: November 25, 1936   Cancelled Treatment:    Reason Eval/Treat Not Completed: Patient at procedure or test/ unavailable. Thank you for the OT consult. Order received and chart reviewed. Upon arrival to pt room, pt with hospital staff for ECHO. Will hold at this time and re-attempt at a later date/time as available and pt medically appropriate for OT evaluation.   Shara Blazing, M.S., OTR/L Ascom: [phone] 06/13/20, 2:08 PM

## 2020-06-13 NOTE — ED Notes (Signed)
Pt with physical therapy ambulating. Pt RA sats dropped to 75%. Pt placed on 2L [place] with improvement 93% while ambulating. Pt able to ambulate with steady gait. Pt denies any symptoms.

## 2020-06-14 ENCOUNTER — Telehealth: Payer: Self-pay

## 2020-06-14 DIAGNOSIS — I4891 Unspecified atrial fibrillation: Principal | ICD-10-CM

## 2020-06-14 DIAGNOSIS — J479 Bronchiectasis, uncomplicated: Secondary | ICD-10-CM

## 2020-06-14 LAB — CBC
HCT: 33.8 % — ABNORMAL LOW (ref 36.0–46.0)
Hemoglobin: 10.3 g/dL — ABNORMAL LOW (ref 12.0–15.0)
MCH: 24.7 pg — ABNORMAL LOW (ref 26.0–34.0)
MCHC: 30.5 g/dL (ref 30.0–36.0)
MCV: 81.1 fL (ref 80.0–100.0)
Platelets: 385 10*3/uL (ref 150–400)
RBC: 4.17 MIL/uL (ref 3.87–5.11)
RDW: 15.3 % (ref 11.5–15.5)
WBC: 9.7 10*3/uL (ref 4.0–10.5)
nRBC: 0 % (ref 0.0–0.2)

## 2020-06-14 LAB — BASIC METABOLIC PANEL
Anion gap: 8 (ref 5–15)
BUN: 25 mg/dL — ABNORMAL HIGH (ref 8–23)
CO2: 32 mmol/L (ref 22–32)
Calcium: 8.6 mg/dL — ABNORMAL LOW (ref 8.9–10.3)
Chloride: 100 mmol/L (ref 98–111)
Creatinine, Ser: 0.73 mg/dL (ref 0.44–1.00)
GFR calc Af Amer: >60 mL/min (ref >60)
GFR calc non Af Amer: >60 mL/min (ref >60)
Glucose, Bld: 103 mg/dL — ABNORMAL HIGH (ref 70–99)
Potassium: 4.2 mmol/L (ref 3.5–5.1)
Sodium: 140 mmol/L (ref 135–145)

## 2020-06-14 LAB — SEDIMENTATION RATE: Sed Rate: 10 mm/hr (ref 0–30)

## 2020-06-14 LAB — TROPONIN I (HIGH SENSITIVITY): Troponin I (High Sensitivity): 18 ng/L — ABNORMAL HIGH (ref <18)

## 2020-06-14 LAB — BRAIN NATRIURETIC PEPTIDE: B Natriuretic Peptide: 404.7 pg/mL — ABNORMAL HIGH (ref 0.0–100.0)

## 2020-06-14 MED ORDER — DOCUSATE SODIUM 100 MG PO CAPS
100.0000 mg | ORAL_CAPSULE | Freq: Two times a day (BID) | ORAL | Status: DC
  Administered 2020-06-14 – 2020-06-15 (×2): 100 mg via ORAL
  Filled 2020-06-14 (×2): qty 1

## 2020-06-14 MED ORDER — DILTIAZEM HCL 30 MG PO TABS
30.0000 mg | ORAL_TABLET | Freq: Four times a day (QID) | ORAL | Status: DC
  Administered 2020-06-14 (×2): 30 mg via ORAL
  Filled 2020-06-14 (×2): qty 1

## 2020-06-14 MED ORDER — IPRATROPIUM BROMIDE 0.02 % IN SOLN
0.5000 mg | Freq: Four times a day (QID) | RESPIRATORY_TRACT | Status: DC | PRN
  Administered 2020-06-14: 0.5 mg via RESPIRATORY_TRACT
  Filled 2020-06-14: qty 2.5

## 2020-06-14 MED ORDER — ENSURE ENLIVE PO LIQD: 237.0000 mL | Freq: Two times a day (BID) | ORAL | Status: DC

## 2020-06-14 NOTE — Progress Notes (Signed)
Physical Therapy Treatment Patient Details Name: Jackie Turner MRN: [id number] DOB: 03/31/1936 Today's Date: 06/14/2020    History of Present Illness Jackie Turner is a 84 y.o. female with past medical history of asthma, bronchiectasis, spinal stenosis, osteoporosis s/p compression fracture x2, and HLD who presents to the ED complaining of hand pain and presenting with insidious hypoxia without SOB.    PT Comments    Pt reports feeling better today, appears to be more well rested. Pt agreeable to PT session. Pt AMB ~510ft on room air, no appreciable dyspnea, albeit sats remain consistently 85-87% SpO2 checked every 149ft. Pt moved to 2L/min for rest of AMB sats at 91%. Pt has 2 mild LOB during session, but corrects LOB without need for physical assistance. Pt continues to progress, continues to have hypoxia issues with activity.     Follow Up Recommendations  No PT follow up     Equipment Recommendations  None recommended by PT    Recommendations for Other Services       Precautions / Restrictions Precautions Precautions: Fall Restrictions Weight Bearing Restrictions: No    Mobility  Bed Mobility                  Transfers Overall transfer level: Needs assistance Equipment used: None Transfers: Sit to/from Stand Sit to Stand: Supervision            Ambulation/Gait Ambulation/Gait assistance: Supervision;Min guard Gait Distance (Feet): 800 Feet Assistive device: None Gait Pattern/deviations: WFL(Within Functional Limits) Gait velocity: 0.67m/s   General Gait Details: first half on room air with Sats 85-87%. Then moved to 2L to finish at 91%.   Stairs             Wheelchair Mobility    Modified Rankin (Stroke Patients Only)       Balance Overall balance assessment: Modified Independent;Mild deficits observed, not formally tested                                          Cognition Arousal/Alertness:  Awake/alert Behavior During Therapy: WFL for tasks assessed/performed Overall Cognitive Status: Within Functional Limits for tasks assessed                                        Exercises      General Comments        Pertinent Vitals/Pain Pain Assessment: No/denies pain    Home Living                      Prior Function            PT Goals (current goals can now be found in the care plan section) Acute Rehab PT Goals Patient Stated Goal: return to home with normal lung function PT Goal Formulation: With patient Time For Goal Achievement: 06/27/20 Potential to Achieve Goals: Fair Progress towards PT goals: Progressing toward goals    Frequency    Min 2X/week      PT Plan Current plan remains appropriate    Co-evaluation              AM-PAC PT "6 Clicks" Mobility   Outcome Measure  Help needed turning from your back to your side while in a flat bed without using bedrails?: None Help  needed moving from lying on your back to sitting on the side of a flat bed without using bedrails?: None Help needed moving to and from a bed to a chair (including a wheelchair)?: None Help needed standing up from a chair using your arms (e.g., wheelchair or bedside chair)?: None Help needed to walk in hospital room?: A Little Help needed climbing 3-5 steps with a railing? : A Little 6 Click Score: 22    End of Session Equipment Utilized During Treatment: Gait belt;Oxygen Activity Tolerance: Patient tolerated treatment well;No increased pain Patient left: in bed;with family/visitor present;with call bell/phone within reach Nurse Communication: Mobility status PT Visit Diagnosis: Other abnormalities of gait and mobility (R26.89)     Time: 1401-1420 PT Time Calculation (min) (ACUTE ONLY): 19 min  Charges:  $Therapeutic Exercise: 8-22 mins                     4:32 PM, 06/14/20 Etta Grandchild, PT, DPT Physical Therapist - [facility] [address]  [phone] ([place])    [place] C 06/14/2020, 4:31 PM

## 2020-06-14 NOTE — Consult Note (Signed)
CARDIOLOGY CONSULT NOTE               Patient ID: Jackie Turner MRN: [id number] DOB/AGE: 03-14-1936 84 y.o.  Admit date: 06/12/2020 Referring Physician [facility] Primary Physician Fenton Malling, PA-C Primary Cardiologist none Reason for Consultation new onset atrial fibrillation  HPI: 84 year old female referred for evaluation of new onset atrial fibrillation.  The patient has a history of moderate persistent asthma, hypertension, and bronchiectasis with no known cardiac history.  The patient was in her usual state of health until the afternoon of 06/12/2020 when she slipped out of her recliner to the floor, with apparent brief loss of consciousness.  She states she briefly blacked out, knew she was falling, and regained consciousness when she hit the floor.  She had no residual deficits afterwards, got off the floor, ate lunch, and decided to go to the urgent care for evaluation of her right hand that she hit upon falling.  While in the urgent care, the patient was noted to be dyspneic with oxygen saturation of 87% on room air.  ECG revealed atrial fibrillation at a rate of 130 bpm without acute ST or T wave abnormalities.  The patient denies a known history of atrial fibrillation, and no evidence noted upon review of prior ECGs.  She was given Cardizem bolus and started on drip for rate control.  Chest CTA negative for pulmonary embolus, hypertension, groundglass opacities, right upper lobe 5 mm perifissural nodule, and chronic bronchiectasis. Admission labs notable for BNP 509, D-dimer 778, normal TSH, high-sensitivity troponin 14 and 18. The patient denies chest pain or peripheral edema. The patient's granddaughter at the bedside reports that the patient has appeared short of breath with exertion for at least 2 years, and has had syncopal episodes for years, as well, similar to this recent episode. She does not use oxygen at home. She has also been experiencing palpitations with heart  racing on a nightly basis for quite some time. The patient has a chads vasc score of 3. She is currently in sinus rhythm on Cardizem 30 mg q 6 hours. The last dose was not given due to heart rates in the 40s-50s. 2D echocardiogram revealed normal left ventricular function with LVEF 60-65% with moderate LVH, mild mitral regurgitation.  Review of systems complete and found to be negative unless listed above     Past Medical History:  Diagnosis Date  . Allergy   . Asthma   . Hyperlipidemia   . Osteoporosis     Past Surgical History:  Procedure Laterality Date  . ABDOMINAL HYSTERECTOMY  2003  . BREAST BIOPSY  1976, 1978   benign  . carpal tunnel repair Bilateral    R 1990; L 1997  . EYE SURGERY Bilateral    cataract extraction  . HAND TENDON SURGERY Right    Trigger finger  . IR RADIOLOGIST EVAL & MGMT  06/13/2017  . IR VERTEBROPLASTY CERV/THOR BX INC UNI/BIL INC/INJECT/IMAGING  06/17/2017  . TONSILLECTOMY  1961    Medications Prior to Admission  Medication Sig Dispense Refill Last Dose  . doxepin (SINEQUAN) 10 MG capsule 1 Capsule(s) By Mouth 4-5 Times Daily PRN   06/12/2020 at 2130  . fluticasone (FLONASE) 50 MCG/ACT nasal spray Place into both nostrils.   06/12/2020 at Unknown time  . Fluticasone-Salmeterol (ADVAIR DISKUS) 250-50 MCG/DOSE AEPB Inhale 1 puff into the lungs daily.    06/13/2020 at 2130  . Fluticasone-Umeclidin-Vilant (TRELEGY ELLIPTA) 100-62.5-25 MCG/INH AEPB Inhale 1 puff into the lungs daily.  60 each 11 06/13/2020 at 2145  . oxybutynin (DITROPAN XL) 15 MG 24 hr tablet Take 15 mg by mouth at bedtime.    06/12/2020 at 2130  . Tiotropium Bromide Monohydrate (SPIRIVA RESPIMAT) 1.25 MCG/ACT AERS Inhale 2 puffs into the lungs daily.    06/13/2020 at 2200  . ULTRAM 50 MG tablet Take 1 tablet (50 mg total) by mouth every 6 (six) hours as needed. 90 tablet 5 06/13/2020 at PRN  . VENTOLIN HFA 108 (90 Base) MCG/ACT inhaler INHALE TWO PUFFS INTO THE LUNGS EVERY 4 HOURS AS NEEDED FOR  WHEEZING OR SHORTNESS OF BREATH 18 g 5 PRN at PRN  . verapamil (CALAN-SR) 180 MG CR tablet TAKE 1/2 TABLET (90 MG TOTAL) BY MOUTH DAILY 45 tablet 1 06/12/2020 at 2130   Social History   Socioeconomic History  . Marital status: Widowed    Spouse name: [name]  . Number of children: 0  . Years of education: H/S  . Highest education level: 12th grade  Occupational History  . Occupation: Retired  Tobacco Use  . Smoking status: Never Smoker  . Smokeless tobacco: Never Used  Vaping Use  . Vaping Use: Never used  Substance and Sexual Activity  . Alcohol use: No  . Drug use: No  . Sexual activity: Never  Other Topics Concern  . Not on file  Social History Narrative  . Not on file   Social Determinants of Health   Financial Resource Strain:   . Difficulty of Paying Living Expenses:   Food Insecurity:   . Worried About Charity fundraiser in the Last Year:   . Arboriculturist in the Last Year:   Transportation Needs:   . Film/video editor (Medical):   Marland Kitchen Lack of Transportation (Non-Medical):   Physical Activity: Inactive  . Days of Exercise per Week: 0 days  . Minutes of Exercise per Session: 0 min  Stress: No Stress Concern Present  . Feeling of Stress : Not at all  Social Connections: Unknown  . Frequency of Communication with Friends and Family: Patient refused  . Frequency of Social Gatherings with Friends and Family: Patient refused  . Attends Religious Services: Patient refused  . Active Member of Clubs or Organizations: Patient refused  . Attends Archivist Meetings: Patient refused  . Marital Status: Patient refused  Intimate Partner Violence: Unknown  . Fear of Current or Ex-Partner: Patient refused  . Emotionally Abused: Patient refused  . Physically Abused: Patient refused  . Sexually Abused: Patient refused    Family History  Problem Relation Age of Onset  . Heart disease Mother   . CAD Mother   . Congestive Heart Failure Mother   .  Osteoarthritis Mother   . Cancer Sister        breast  . Breast cancer Sister       Review of systems complete and found to be negative unless listed above      PHYSICAL EXAM  General: Well developed, well nourished, in no acute distress, sitting up on side of bed eating breakfast HEENT:  Normocephalic and atramatic Neck:  No JVD.  Lungs: normal effort of breathing on supplemental oxygen via nasal cannula, speaking in full sentences, diminished basilar breath sounds  Heart: HRRR . Normal S1 and S2 without gallops or murmurs.  Abdomen: nondistended Msk:  Back normal,  Gait not assessed. No obvious deformities. Extremities: No clubbing, cyanosis or edema.   Neuro: Alert and oriented X 3. Psych:  Good affect, responds appropriately  Labs:   Lab Results  Component Value Date   WBC 9.7 06/14/2020   HGB 10.3 (L) 06/14/2020   HCT 33.8 (L) 06/14/2020   MCV 81.1 06/14/2020   PLT 385 06/14/2020    Recent Labs  Lab 06/14/20 0548  NA 140  K 4.2  CL 100  CO2 32  BUN 25*  CREATININE 0.73  CALCIUM 8.6*  GLUCOSE 103*   No results found for: CKTOTAL, CKMB, CKMBINDEX, TROPONINI  Lab Results  Component Value Date   CHOL 216 (H) 05/21/2018   CHOL 216 (H) 06/03/2016   CHOL 213 (A) 01/27/2014   Lab Results  Component Value Date   HDL 78 05/21/2018   HDL 71 06/03/2016   HDL 71 (A) 01/27/2014   Lab Results  Component Value Date   LDLCALC 124 (H) 05/21/2018   LDLCALC 129 (H) 06/03/2016   LDLCALC 119 01/27/2014   Lab Results  Component Value Date   TRIG 70 05/21/2018   TRIG 80 06/03/2016   TRIG 115 01/27/2014   Lab Results  Component Value Date   CHOLHDL 3.0 06/03/2016   No results found for: LDLDIRECT    Radiology: DG Chest 2 View  Result Date: 06/12/2020 CLINICAL DATA:  Shortness of breath. EXAM: CHEST - 2 VIEW COMPARISON:  January 29, 2019. FINDINGS: Stable cardiomegaly. No pneumothorax or pleural effusion is noted. Stable elevation of left hemidiaphragm is  noted. Minimal bibasilar subsegmental atelectasis is noted. Old lower thoracic compression fracture is noted. IMPRESSION: Minimal bibasilar subsegmental atelectasis. Electronically Signed   By: Marijo Conception M.D.   On: 06/12/2020 16:53   CT Angio Chest PE W/Cm &/Or Wo Cm  Result Date: 06/12/2020 CLINICAL DATA:  Shortness of breath. EXAM: CT ANGIOGRAPHY CHEST WITH CONTRAST TECHNIQUE: Multidetector CT imaging of the chest was performed using the standard protocol during bolus administration of intravenous contrast. Multiplanar CT image reconstructions and MIPs were obtained to evaluate the vascular anatomy. CONTRAST:  87mL OMNIPAQUE IOHEXOL 350 MG/ML SOLN COMPARISON:  Radiograph earlier this day.  Chest CT 11/13/2015 FINDINGS: Cardiovascular: There are no filling defects within the pulmonary arteries to suggest pulmonary embolus. There is dilatation of the main pulmonary artery of 4.4 cm. Tortuous atherosclerotic thoracic aorta without dissection or acute aortic findings. Mild multi chamber cardiomegaly. No pericardial effusion. Mediastinum/Nodes: Prominent subcarinal node measuring 14 mm, likely reactive. No other enlarged mediastinal or hilar lymph nodes. No visualized thyroid nodule. Decompressed esophagus. Lungs/Pleura: Motion artifact limitations, particularly through the apices and bases. Chronic but progressive elevation of the left hemidiaphragm. Left lower lobe bronchiectasis with progressive left lung base consolidation. Paramediastinal bronchiectasis in the right middle lobe, chronic. Heterogeneous pulmonary parenchyma with areas of ground-glass. Some of these ground-glass areas appear nodular, for example left upper lobe 7 mm nodular ground-glass opacities are ease 6 image 20. There is a perifissural nodule in the right upper lobe measuring 5 mm, series 6, image 27, that may be solid but motion obscures accurate characterization. Minimal adjacent perifissural consolidation in the right upper lobe.  Upper Abdomen: Partially motion obscured. Elevated left hemidiaphragm. Musculoskeletal: Chronic T10 compression fracture with vertebral augmentation, however new from 2016. There is a mild T9 superior endplate compression fracture, chronic based on thoracic MRI 02/18/2018. No acute osseous abnormalities are seen. Review of the MIP images confirms the above findings. IMPRESSION: 1. No pulmonary embolus. Dilated main pulmonary artery consistent with pulmonary arterial hypertension. 2. Chronic but progressive elevation of the left hemidiaphragm. Progressive left lung base  consolidation, favor atelectasis related to diaphragm elevation. Left lower lobe bronchiectasis is again seen but primarily obscured. 3. Heterogeneous pulmonary parenchyma with areas of ground-glass opacity. Favor small airways disease, however some of these areas appear nodular. Recommend CT follow-up as a ground-glass nodule. Recommend CT at 6-12 months to confirm persistence. 4. Right upper lobe 5 mm perifissural nodule may be solid but motion obscures accurate characterization. Recommend attention to this at follow-up. 5. Chronic bronchiectasis in the right middle lobe and lingula. Aortic Atherosclerosis (ICD10-I70.0). Electronically Signed   By: Keith Rake M.D.   On: 06/12/2020 23:16   DG Hand 2 View Right  Result Date: 06/12/2020 CLINICAL DATA:  Fall EXAM: RIGHT HAND - 2 VIEW COMPARISON:  None. FINDINGS: No fracture or malalignment. Mild degenerative changes at the D IP and PIP joints. IMPRESSION: No acute osseous abnormality. Electronically Signed   By: Donavan Foil M.D.   On: 06/12/2020 21:42   ECHOCARDIOGRAM COMPLETE  Result Date: 06/13/2020    ECHOCARDIOGRAM REPORT   Patient Name:   Jackie Turner Date of Exam: 06/13/2020 Medical Rec #:  [id number]             Height:       58.0 in Accession #:    [id number]            Weight:       115.0 lb Date of Birth:  Jan 01, 1936             BSA:          1.439 m Patient Age:    8  years              BP:           133/79 mmHg Patient Gender: F                     HR:           58 bpm. Exam Location:  [facility] Procedure: 2D Echo, Cardiac Doppler and Color Doppler Indications:     Atrial Fibrillation 427.31  History:         Patient has no prior history of Echocardiogram examinations.                  Risk Factors:Dyslipidemia.  Sonographer:     Sherrie Sport RDCS (AE) Referring Phys:  [id number] Barb Merino Diagnosing Phys: Nelva Bush MD  Sonographer Comments: Suboptimal parasternal window. IMPRESSIONS  1. Left ventricular ejection fraction, by estimation, is 60 to 65%. The left ventricle has normal function. Left ventricular endocardial border not optimally defined to evaluate regional wall motion. There is moderate left ventricular hypertrophy. Left ventricular diastolic parameters are consistent with Grade I diastolic dysfunction (impaired relaxation). Elevated left atrial pressure.  2. Right ventricular systolic function is normal. The right ventricular size is mildly enlarged. Tricuspid regurgitation signal is inadequate for assessing PA pressure.  3. Left atrial size was mildly dilated.  4. Right atrial size was mildly dilated.  5. The mitral valve is grossly normal. Mild mitral valve regurgitation.  6. The aortic valve was not well visualized. Aortic valve regurgitation is trivial. No aortic stenosis is present.  7. Pulmonic valve regurgitation not well assessed. FINDINGS  Left Ventricle: Left ventricular ejection fraction, by estimation, is 60 to 65%. The left ventricle has normal function. Left ventricular endocardial border not optimally defined to evaluate regional wall motion. The left ventricular internal cavity size was normal in size. There is moderate left ventricular  hypertrophy. Left ventricular diastolic parameters are consistent with Grade I diastolic dysfunction (impaired relaxation). Elevated left atrial pressure. Right Ventricle: The right ventricular size is mildly  enlarged. Right vetricular wall thickness was not assessed. Right ventricular systolic function is normal. Tricuspid regurgitation signal is inadequate for assessing PA pressure. Left Atrium: Left atrial size was mildly dilated. Right Atrium: Right atrial size was mildly dilated. Pericardium: There is no evidence of pericardial effusion. Mitral Valve: The mitral valve is grossly normal. Mild mitral valve regurgitation. Tricuspid Valve: The tricuspid valve is not well visualized. Tricuspid valve regurgitation is trivial. Aortic Valve: The aortic valve was not well visualized. Aortic valve regurgitation is trivial. No aortic stenosis is present. Aortic valve mean gradient measures 5.0 mmHg. Aortic valve peak gradient measures 8.5 mmHg. Aortic valve area, by VTI measures 2.34 cm. Pulmonic Valve: The pulmonic valve was not well visualized. Pulmonic valve regurgitation not well assessed. Aorta: The aortic root was not well visualized. Pulmonary Artery: The pulmonary artery is not well seen. Venous: The inferior vena cava was not well visualized. IAS/Shunts: The interatrial septum was not well visualized.  LEFT VENTRICLE PLAX 2D LVIDd:         3.81 cm  Diastology LVIDs:         2.67 cm  LV e' lateral:   9.79 cm/s LV PW:         1.26 cm  LV E/e' lateral: 6.4 LV IVS:        0.93 cm  LV e' medial:    3.92 cm/s LVOT diam:     2.00 cm  LV E/e' medial:  16.1 LV SV:         70 LV SV Index:   49 LVOT Area:     3.14 cm  RIGHT VENTRICLE RV Basal diam:  4.01 cm RV S prime:     13.80 cm/s LEFT ATRIUM             Index       RIGHT ATRIUM           Index LA diam:        3.90 cm 2.71 cm/m  RA Area:     20.90 cm LA Vol (A2C):   53.3 ml 37.03 ml/m RA Volume:   61.20 ml  42.52 ml/m LA Vol (A4C):   69.2 ml 48.07 ml/m LA Biplane Vol: 60.8 ml 42.24 ml/m  AORTIC VALVE AV Area (Vmax):    2.40 cm AV Area (Vmean):   2.33 cm AV Area (VTI):     2.34 cm AV Vmax:           145.50 cm/s AV Vmean:          105.500 cm/s AV VTI:             0.299 m AV Peak Grad:      8.5 mmHg AV Mean Grad:      5.0 mmHg LVOT Vmax:         111.00 cm/s LVOT Vmean:        78.100 cm/s LVOT VTI:          0.223 m LVOT/AV VTI ratio: 0.75  AORTA Ao Root diam: 3.60 cm MITRAL VALVE MV Area (PHT): 3.45 cm    SHUNTS MV Decel Time: 220 msec    Systemic VTI:  0.22 m MV E velocity: 63.00 cm/s  Systemic Diam: 2.00 cm MV A velocity: 92.10 cm/s MV E/A ratio:  0.68 Harrell Gave End MD Electronically signed by Nelva Bush  MD Signature Date/Time: 06/13/2020/6:29:19 PM    Final     EKG: sinus rhythm, 64 bpm  ASSESSMENT AND PLAN:  1. New onset atrial fibrillation with RVR, in the setting of acute hypoxic respiratory failure due to bronchiectasis and possible progressive lung disease, converted to sinus rhythm with Cardizem drip. No prior ECG revealing atrial fibrillation, but patient reports experiencing palpitations with heart racing on a nightly basis. 2D echocardiogram revealed normal left ventricular function with LVEF 60-65% with moderate LVH, mild mitral regurgitation. [place] vasc score of 4 (age-60, female-1, HTN-1). The risks and benefits of chronic anticoagulation were discussed with the patient and her granddaughter, and patient agrees to initiate anticoagulation. 2. Acute hypoxic respiratory failure, secondary to bronchiectasis and possible progressive lung disease, started on doxycycline and prednisone, and supplemental oxygen without home oxygen use.   Recommendations: 1. Agree with current therapy 2. Start Eliquis 2.5 mg BID 3. Continue Cardizem 30 mg q 6 hours 4. Plan to have patient follow-up as outpatient with Dr. Saralyn Pilar at which time prolonged cardiac monitoring will be ordered to assess atrial fibrillation burden.   Signed: Clabe Seal PA-C 06/14/2020, 1:23 PM

## 2020-06-14 NOTE — Telephone Encounter (Signed)
That is fine she can switch. She is a very pleasant patient

## 2020-06-14 NOTE — Progress Notes (Signed)
PROGRESS NOTE   Jackie Turner  GGY:[id number]    DOB: 14-Aug-1936    DOA: 06/12/2020  PCP: Mar Daring, PA-C   I have briefly reviewed patients previous medical records in [facility].  Chief Complaint  Patient presents with  . Hand Pain  . Shortness of Breath    Brief Narrative:  84 year old female with PMH of COPD, bronchiectasis (although patient denies these), lifelong non-smoker, asthma, hyperlipidemia, initially presented to the urgent care with right hand pain following fall out of her recliner while asleep.  She was noted to be very dyspneic while walking in the clinic and oxygen saturations were in the 80s.  She was transferred to the ED.  She reports 3 months history of progressive dyspnea, fatigue, nonproductive cough.  In the ED, tachypneic to 28, oxygen saturation 86% on room air, BNP 509, D-dimer elevated, CTA chest negative for acute PE but showed chronic bronchiectasis right middle lobe and lingula, progressive left lung base consolidation.  She was treated with bronchodilators and prednisone.  In the ED she developed A. fib with RVR, started on Cardizem bolus and infusion.  Admitted for acute on suspected chronic respiratory failure with hypoxia in the context of asthma/COPD, bronchiectasis, new onset A. fib with RVR.  Cardiology and pulmonology consulted on 6/30.   Assessment & Plan:  Principal Problem:   Acute on suspect chronic respiratory failure with hypoxia (HCC) Active Problems:   Bronchiectasis without complication (HCC)   Atrial fibrillation with rapid ventricular response (HCC)   Right hand pain   Fall at home, initial encounter   Rapid atrial fibrillation ([place])   New onset atrial fibrillation ([place])   COPD with acute exacerbation ([place])   Acute on suspected chronic respiratory failure with hypoxia: Likely multifactorial due to pulmonary artery hypertension, chronic elevation of left hemidiaphragm with atelectasis, left lower lobe  bronchiectasis, right middle lobe and lingula bronchiectasis, possible small airway disease seen on CT is groundglass appearance.  Evaluate and treat underlying causes as below.  Wean oxygen as tolerated.  Need to reassess home oxygen needs prior to discharge and likely will need some.  Patient and family aware.  Bronchiectasis: Unclear etiology.  May be progressive.  Pulmonary input appreciated.  Recommending sputum MAI, Pseudomonas etc. and pulmonary toilet with flutter valve.  Currently on doxycycline and prednisone.  Chronic left hemidiaphragm elevation:?  Paralyzed.  Pulmonology checking sniff test and incentive spirometry.  Pulmonary artery hypertension: Pulmonology working up-BNP, ANA, ESR, echo,?  Right heart cath, outpatient PFTs and sleep study, evaluation for home oxygen.  Possible COPD/?  Asthma: Duo nebs.  Outpatient PFTs.  New onset A. fib with RVR: Reverted to sinus rhythm.  Transition to oral Cardizem.  However significant sinus bradycardia in the high 40s this a.m. and Cardizem held.  Thereby reduced Cardizem dose to 30 mg every 6 hourly with holding parameters.  Cardiology input appreciated.  TTE shows LVEF 60 to 65%, moderate LVH.  CHA2DS2-VASc score: 4.  They have initiated Eliquis 2.5 mg twice daily.  TSH normal.  Groundglass infiltrates versus nodules on CT chest: Needs CT follow-up as outpatient.  Pulmonology input appreciated and checking serologies as part of work-up (ANA, ANCA, ACE I, RA and hypersensitivity pneumonitis panel.  Right hand pain: Traumatic.  No fracture.  Seems to have resolved.  Essential hypertension: Verapamil discontinued.  Currently on Cardizem.  Controlled.  Hyperactive bladder: Continue home oxytocin and doxepin  Microcytic anemia: Stable.  Outpatient evaluation.  May be chronic disease versus iron  deficiency.  Body mass index is 23.93 kg/m.  Nutritional Status Nutrition Problem: Inadequate oral intake Etiology: decreased appetite,  nausea Signs/Symptoms: per patient/family report Interventions: Ensure Enlive (each supplement provides 350kcal and 20 grams of protein)  DVT prophylaxis: Eliquis initiated 6/30 Code Status: Full Family Communication: Discussed in detail with patient's next of kin, daughter and her significant other. Disposition:  Status is: Inpatient  Remains inpatient appropriate because:Inpatient level of care appropriate due to severity of illness   Dispo: The patient is from: Home              Anticipated d/c is to: Home              Anticipated d/c date is: 2 days              Patient currently is not medically stable to d/c.        Consultants:   Cardiology Pulmonology  Procedures:   None  Antimicrobials:   Doxycycline   Subjective:  Feels better.  Dyspnea improved but not resolved.  Appears that she has been having progressive dyspnea for several months with associated fatigue even on minimal exertion.  No chest pain or cough.  Lifelong non-smoker.  Has never been to a pulmonologist  Objective:   Vitals:   06/14/20 0757 06/14/20 0918 06/14/20 1114 06/14/20 1600  BP: 136/71  140/83 (!) 144/76  Pulse: (!) 51  (!) 52 73  Resp: _0 Temp: 97.7 F (36.5 C) 98.3 F (36.8 C) 98.1 F (36.7 C) 98.9 F (37.2 C)  TempSrc: Oral Oral Oral Oral  SpO2: 98%  96% 95%  Weight:      Height:        General exam: Elderly female, small built and frail, chronically ill looking lying comfortably propped up in bed. Respiratory system: Harsh breath sounds bilaterally but no obvious wheezing, rhonchi or crackles. Respiratory effort normal. Cardiovascular system: S1 & S2 heard, regular bradycardia. No JVD, murmurs, rubs, gallops or clicks. No pedal edema.  Telemetry personally reviewed: Sinus bradycardia in the high 40s overnight and in the 50s this morning. Gastrointestinal system: Abdomen is nondistended, soft and nontender. No organomegaly or masses felt. Normal bowel sounds  heard. Central nervous system: Alert and oriented. No focal neurological deficits. Extremities: Symmetric 5 x 5 power. Skin: No rashes, lesions or ulcers Psychiatry: Judgement and insight appear normal. Mood & affect appropriate.     Data Reviewed:   I have personally reviewed following labs and imaging studies   CBC: Recent Labs  Lab 06/12/20 1633 06/13/20 0549 06/14/20 0548  WBC 6.8 7.7 9.7  HGB 10.8* 10.7* 10.3*  HCT 34.4* 34.6* 33.8*  MCV 79.6* 79.5* 81.1  PLT 371 379 353    Basic Metabolic Panel: Recent Labs  Lab 06/12/20 1633 06/13/20 0549 06/14/20 0548  NA 142 140 140  K 4.5 4.6 4.2  CL 101 100 100  CO2 30 30 32  GLUCOSE 93 194* 103*  BUN 27* 24* 25*  CREATININE 0.80 0.83 0.73  CALCIUM 9.1 8.8* 8.6*    Liver Function Tests: No results for input(s): AST, ALT, ALKPHOS, BILITOT, PROT, ALBUMIN in the last 168 hours.  CBG: No results for input(s): GLUCAP in the last 168 hours.  Microbiology Studies:   Recent Results (from the past 240 hour(s))  SARS Coronavirus 2 by RT PCR (hospital order, performed in [facility] hospital lab) Nasopharyngeal Nasopharyngeal Swab     Status: None   Collection Time: 06/13/20 12:03  AM   Specimen: Nasopharyngeal Swab  Result Value Ref Range Status   SARS Coronavirus 2 NEGATIVE NEGATIVE Final    Comment: (NOTE) SARS-CoV-2 target nucleic acids are NOT DETECTED.  The SARS-CoV-2 RNA is generally detectable in upper and lower respiratory specimens during the acute phase of infection. The lowest concentration of SARS-CoV-2 viral copies this assay can detect is 250 copies / mL. A negative result does not preclude SARS-CoV-2 infection and should not be used as the sole basis for treatment or other patient management decisions.  A negative result may occur with improper specimen collection / handling, submission of specimen other than nasopharyngeal swab, presence of viral mutation(s) within the areas targeted by this assay, and  inadequate number of viral copies (<250 copies / mL). A negative result must be combined with clinical observations, patient history, and epidemiological information.  Fact Sheet for Patients:   StrictlyIdeas.no  Fact Sheet for Healthcare Providers: BankingDealers.co.za  This test is not yet approved or  cleared by the [place] FDA and has been authorized for detection and/or diagnosis of SARS-CoV-2 by FDA under an Emergency Use Authorization (EUA).  This EUA will remain in effect (meaning this test can be used) for the duration of the COVID-19 declaration under Section 564(b)(1) of the Act, 21 U.S.C. section 360bbb-3(b)(1), unless the authorization is terminated or revoked sooner.  Performed at [facility], [address] [place],  [address]      Radiology Studies:  CT Angio Chest PE W/Cm &/Or Wo Cm  Result Date: 06/12/2020 CLINICAL DATA:  Shortness of breath. EXAM: CT ANGIOGRAPHY CHEST WITH CONTRAST TECHNIQUE: Multidetector CT imaging of the chest was performed using the standard protocol during bolus administration of intravenous contrast. Multiplanar CT image reconstructions and MIPs were obtained to evaluate the vascular anatomy. CONTRAST:  44m OMNIPAQUE IOHEXOL 350 MG/ML SOLN COMPARISON:  Radiograph earlier this day.  Chest CT 11/13/2015 FINDINGS: Cardiovascular: There are no filling defects within the pulmonary arteries to suggest pulmonary embolus. There is dilatation of the main pulmonary artery of 4.4 cm. Tortuous atherosclerotic thoracic aorta without dissection or acute aortic findings. Mild multi chamber cardiomegaly. No pericardial effusion. Mediastinum/Nodes: Prominent subcarinal node measuring 14 mm, likely reactive. No other enlarged mediastinal or hilar lymph nodes. No visualized thyroid nodule. Decompressed esophagus. Lungs/Pleura: Motion artifact limitations, particularly through the apices and  bases. Chronic but progressive elevation of the left hemidiaphragm. Left lower lobe bronchiectasis with progressive left lung base consolidation. Paramediastinal bronchiectasis in the right middle lobe, chronic. Heterogeneous pulmonary parenchyma with areas of ground-glass. Some of these ground-glass areas appear nodular, for example left upper lobe 7 mm nodular ground-glass opacities are ease 6 image 20. There is a perifissural nodule in the right upper lobe measuring 5 mm, series 6, image 27, that may be solid but motion obscures accurate characterization. Minimal adjacent perifissural consolidation in the right upper lobe. Upper Abdomen: Partially motion obscured. Elevated left hemidiaphragm. Musculoskeletal: Chronic T10 compression fracture with vertebral augmentation, however new from 2016. There is a mild T9 superior endplate compression fracture, chronic based on thoracic MRI 02/18/2018. No acute osseous abnormalities are seen. Review of the MIP images confirms the above findings. IMPRESSION: 1. No pulmonary embolus. Dilated main pulmonary artery consistent with pulmonary arterial hypertension. 2. Chronic but progressive elevation of the left hemidiaphragm. Progressive left lung base consolidation, favor atelectasis related to diaphragm elevation. Left lower lobe bronchiectasis is again seen but primarily obscured. 3. Heterogeneous pulmonary parenchyma with areas of ground-glass opacity. Favor small airways  disease, however some of these areas appear nodular. Recommend CT follow-up as a ground-glass nodule. Recommend CT at 6-12 months to confirm persistence. 4. Right upper lobe 5 mm perifissural nodule may be solid but motion obscures accurate characterization. Recommend attention to this at follow-up. 5. Chronic bronchiectasis in the right middle lobe and lingula. Aortic Atherosclerosis (ICD10-I70.0). Electronically Signed   By: Keith Rake M.D.   On: 06/12/2020 23:16   DG Hand 2 View Right  Result  Date: 06/12/2020 CLINICAL DATA:  Fall EXAM: RIGHT HAND - 2 VIEW COMPARISON:  None. FINDINGS: No fracture or malalignment. Mild degenerative changes at the D IP and PIP joints. IMPRESSION: No acute osseous abnormality. Electronically Signed   By: Donavan Foil M.D.   On: 06/12/2020 21:42   ECHOCARDIOGRAM COMPLETE  Result Date: 06/13/2020    ECHOCARDIOGRAM REPORT   Patient Name:   LEILENE DIPRIMA Dunson Date of Exam: 06/13/2020 Medical Rec #:  [id number]             Height:       58.0 in Accession #:    [id number]            Weight:       115.0 lb Date of Birth:  [birth year]             BSA:          1.439 m Patient Age:    63 years              BP:           133/79 mmHg Patient Gender: F                     HR:           58 bpm. Exam Location:  [facility] Procedure: 2D Echo, Cardiac Doppler and Color Doppler Indications:     Atrial Fibrillation 427.31  History:         Patient has no prior history of Echocardiogram examinations.                  Risk Factors:Dyslipidemia.  Sonographer:     Sherrie Sport RDCS (AE) Referring Phys:  [id number] Barb Merino Diagnosing Phys: Nelva Bush MD  Sonographer Comments: Suboptimal parasternal window. IMPRESSIONS  1. Left ventricular ejection fraction, by estimation, is 60 to 65%. The left ventricle has normal function. Left ventricular endocardial border not optimally defined to evaluate regional wall motion. There is moderate left ventricular hypertrophy. Left ventricular diastolic parameters are consistent with Grade I diastolic dysfunction (impaired relaxation). Elevated left atrial pressure.  2. Right ventricular systolic function is normal. The right ventricular size is mildly enlarged. Tricuspid regurgitation signal is inadequate for assessing PA pressure.  3. Left atrial size was mildly dilated.  4. Right atrial size was mildly dilated.  5. The mitral valve is grossly normal. Mild mitral valve regurgitation.  6. The aortic valve was not well visualized. Aortic valve regurgitation  is trivial. No aortic stenosis is present.  7. Pulmonic valve regurgitation not well assessed. FINDINGS  Left Ventricle: Left ventricular ejection fraction, by estimation, is 60 to 65%. The left ventricle has normal function. Left ventricular endocardial border not optimally defined to evaluate regional wall motion. The left ventricular internal cavity size was normal in size. There is moderate left ventricular hypertrophy. Left ventricular diastolic parameters are consistent with Grade I diastolic dysfunction (impaired relaxation). Elevated left atrial pressure. Right Ventricle: The right ventricular size is mildly enlarged. Right  vetricular wall thickness was not assessed. Right ventricular systolic function is normal. Tricuspid regurgitation signal is inadequate for assessing PA pressure. Left Atrium: Left atrial size was mildly dilated. Right Atrium: Right atrial size was mildly dilated. Pericardium: There is no evidence of pericardial effusion. Mitral Valve: The mitral valve is grossly normal. Mild mitral valve regurgitation. Tricuspid Valve: The tricuspid valve is not well visualized. Tricuspid valve regurgitation is trivial. Aortic Valve: The aortic valve was not well visualized. Aortic valve regurgitation is trivial. No aortic stenosis is present. Aortic valve mean gradient measures 5.0 mmHg. Aortic valve peak gradient measures 8.5 mmHg. Aortic valve area, by VTI measures 2.34 cm. Pulmonic Valve: The pulmonic valve was not well visualized. Pulmonic valve regurgitation not well assessed. Aorta: The aortic root was not well visualized. Pulmonary Artery: The pulmonary artery is not well seen. Venous: The inferior vena cava was not well visualized. IAS/Shunts: The interatrial septum was not well visualized.  LEFT VENTRICLE PLAX 2D LVIDd:         3.81 cm  Diastology LVIDs:         2.67 cm  LV e' lateral:   9.79 cm/s LV PW:         1.26 cm  LV E/e' lateral: 6.4 LV IVS:        0.93 cm  LV e' medial:    3.92 cm/s  LVOT diam:     2.00 cm  LV E/e' medial:  16.1 LV SV:         70 LV SV Index:   49 LVOT Area:     3.14 cm  RIGHT VENTRICLE RV Basal diam:  4.01 cm RV S prime:     13.80 cm/s LEFT ATRIUM             Index       RIGHT ATRIUM           Index LA diam:        3.90 cm 2.71 cm/m  RA Area:     20.90 cm LA Vol (A2C):   53.3 ml 37.03 ml/m RA Volume:   61.20 ml  42.52 ml/m LA Vol (A4C):   69.2 ml 48.07 ml/m LA Biplane Vol: 60.8 ml 42.24 ml/m  AORTIC VALVE AV Area (Vmax):    2.40 cm AV Area (Vmean):   2.33 cm AV Area (VTI):     2.34 cm AV Vmax:           145.50 cm/s AV Vmean:          105.500 cm/s AV VTI:            0.299 m AV Peak Grad:      8.5 mmHg AV Mean Grad:      5.0 mmHg LVOT Vmax:         111.00 cm/s LVOT Vmean:        78.100 cm/s LVOT VTI:          0.223 m LVOT/AV VTI ratio: 0.75  AORTA Ao Root diam: 3.60 cm MITRAL VALVE MV Area (PHT): 3.45 cm    SHUNTS MV Decel Time: 220 msec    Systemic VTI:  0.22 m MV E velocity: 63.00 cm/s  Systemic Diam: 2.00 cm MV A velocity: 92.10 cm/s MV E/A ratio:  0.68 Christopher End MD Electronically signed by Nelva Bush MD Signature Date/Time: 06/13/2020/6:29:19 PM    Final      Scheduled Meds:   . apixaban  2.5 mg Oral BID  . diltiazem  30 mg Oral Q6H  . docusate sodium  100 mg Oral BID  . doxycycline  100 mg Oral Q12H  . feeding supplement (ENSURE ENLIVE)  237 mL Oral BID BM  . mouth rinse  15 mL Mouth Rinse BID  . mometasone-formoterol  1 puff Inhalation BID  . oxybutynin  15 mg Oral QHS  . predniSONE  40 mg Oral Q breakfast    Continuous Infusions:     LOS: 1 day     Vernell Leep, MD, [place], [facility]. [facility]    To contact the attending provider between 7A-7P or the covering provider during after hours 7P-7A, please log into the web site [email or web address] and access using universal [place] password for that web site. If you do not have the password, please call the hospital operator.  06/14/2020, 5:52 PM

## 2020-06-14 NOTE — Consult Note (Addendum)
Date: 06/14/2020,   MRN# [id number] Jackie Turner 05-28-1936 Code Status:     Code Status Orders  (From admission, onward)         Start     Ordered   06/13/20 0123  Full code  Continuous        06/13/20 0125        Code Status History    This patient has a current code status but no historical code status.   Advance Care Planning Activity         Past Medical History:  Diagnosis Date   Allergy    Asthma    Hyperlipidemia    Osteoporosis    Surgical Hx:   Family Hx:  Family History  Problem Relation Age of Onset   Heart disease Mother    CAD Mother    Congestive Heart Failure Mother    Osteoarthritis Mother    Cancer Sister        breast   Breast cancer Sister    Social Hx:   Social History   Tobacco Use   Smoking status: Never Smoker   Smokeless tobacco: Never Used  Scientific laboratory technician Use: Never used  Substance Use Topics   Alcohol use: No   Drug use: No     Recent Labs    06/12/20 1633 06/13/20 0100 06/13/20 0549 06/14/20 0548  HGB 10.8*  --  10.7* 10.3*  HCT 34.4*  --  34.6* 33.8*  MCV 79.6*  --  79.5* 81.1  WBC 6.8  --  7.7 9.7  BUN 27*  --  24* 25*  CREATININE 0.80  --  0.83 0.73  GLUCOSE 93  --  194* 103*  CALCIUM 9.1  --  8.8* 8.6*  INR  --  1.1  --   --   ,  Code Status:     Code Status Orders  (From admission, onward)         Start     Ordered   06/13/20 0123  Full code  Continuous        06/13/20 0125        Code Status History    This patient has a current code status but no historical code status.   Advance Care Planning Activity     Hosp day:@LENGTHOFSTAYDAYS @ Referring MD: @ATDPROV @         CC: acute on chronic shortness of breath/ hypoxia  HPI: This is a pleasant 84 year old lady, come in through the ER s/p fall, right hand pain to urgent care, there noted to be c/o worsening shortness of breath, new onset atrial fibrillation, in the setting copd, bronchiectasis, no hemoptysis, fever  or angina.  Cardiology has seen regarding the afib chest ct did not show pe. Tsh normal, lytes ok. She never smoked. Her husband did.  CONTRAST:  83mL OMNIPAQUE IOHEXOL 350 MG/ML SOLN  COMPARISON:  Radiograph earlier this day.  Chest CT 11/13/2015  FINDINGS: Cardiovascular: There are no filling defects within the pulmonary arteries to suggest pulmonary embolus. There is dilatation of the main pulmonary artery of 4.4 cm. Tortuous atherosclerotic thoracic aorta without dissection or acute aortic findings. Mild multi chamber cardiomegaly. No pericardial effusion.  Mediastinum/Nodes: Prominent subcarinal node measuring 14 mm, likely reactive. No other enlarged mediastinal or hilar lymph nodes. No visualized thyroid nodule. Decompressed esophagus.  Lungs/Pleura: Motion artifact limitations, particularly through the apices and bases. Chronic but progressive elevation of the left hemidiaphragm. Left lower lobe bronchiectasis with  progressive left lung base consolidation. Paramediastinal bronchiectasis in the right middle lobe, chronic. Heterogeneous pulmonary parenchyma with areas of ground-glass. Some of these ground-glass areas appear nodular, for example left upper lobe 7 mm nodular ground-glass opacities are ease 6 image 20. There is a perifissural nodule in the right upper lobe measuring 5 mm, series 6, image 27, that may be solid but motion obscures accurate characterization. Minimal adjacent perifissural consolidation in the right upper lobe.  Upper Abdomen: Partially motion obscured. Elevated left hemidiaphragm.  Musculoskeletal: Chronic T10 compression fracture with vertebral augmentation, however new from 2016. There is a mild T9 superior endplate compression fracture, chronic based on thoracic MRI 02/18/2018. No acute osseous abnormalities are seen.  Review of the MIP images confirms the above findings.  IMPRESSION: 1. No pulmonary embolus. Dilated main pulmonary  artery consistent with pulmonary arterial hypertension. 2. Chronic but progressive elevation of the left hemidiaphragm. Progressive left lung base consolidation, favor atelectasis related to diaphragm elevation. Left lower lobe bronchiectasis is again seen but primarily obscured. 3. Heterogeneous pulmonary parenchyma with areas of ground-glass opacity. Favor small airways disease, however some of these areas appear nodular. Recommend CT follow-up as a ground-glass nodule. Recommend CT at 6-12 months to confirm persistence. 4. Right upper lobe 5 mm perifissural nodule may be solid but motion obscures accurate characterization. Recommend attention to this at follow-up. 5. Chronic bronchiectasis in the right middle lobe and lingula.  Aortic Atherosclerosis (ICD10-I70.0).   Electronically Signed   By: Keith Rake M.D.   On: 06/12/2020 23:16 PMHX:  Past Medical History:  Diagnosis Date   Allergy    Asthma    Hyperlipidemia    Osteoporosis    Surgical Hx:  Past Surgical History:  Procedure Laterality Date   ABDOMINAL HYSTERECTOMY  2003   BREAST BIOPSY  1976, 1978   benign   carpal tunnel repair Bilateral    R 1990; L 1997   EYE SURGERY Bilateral    cataract extraction   HAND TENDON SURGERY Right    Trigger finger   IR RADIOLOGIST EVAL & MGMT  06/13/2017   IR VERTEBROPLASTY CERV/THOR BX INC UNI/BIL INC/INJECT/IMAGING  06/17/2017   TONSILLECTOMY  1961   Family Hx:  Family History  Problem Relation Age of Onset   Heart disease Mother    CAD Mother    Congestive Heart Failure Mother    Osteoarthritis Mother    Cancer Sister        breast   Breast cancer Sister    Social Hx:   Social History   Tobacco Use   Smoking status: Never Smoker   Smokeless tobacco: Never Used  Scientific laboratory technician Use: Never used  Substance Use Topics   Alcohol use: No   Drug use: No   Medication:    Home Medication:    Current Medication: @CURMEDTAB @    Allergies:  Nsaids; Amoxicillin; Ibandronic acid; Actonel  [risedronate]; Antihistamines, chlorpheniramine-type; Aspirin; Buspar [buspirone]; Butalbital-aspirin-caffeine; Clarithromycin; Codeine; Gatifloxacin; Hydrocodone-guaifenesin; Moxifloxacin; Oxycodone; Penicillins; Risedronate sodium; Tylenol with codeine #3  [acetaminophen-codeine]; and Raloxifene  Review of Systems: Gen:  Denies  fever, sweats, chills HEENT: Denies blurred vision, double vision, ear pain, eye pain, hearing loss, nose bleeds, sore throat Cvc:  No dizziness, chest pain or heaviness Resp:   Less sob and cough Gi: Denies swallowing difficulty, stomach pain, nausea or vomiting, diarrhea, constipation, bowel incontinence Gu:  Denies bladder incontinence, burning urine Ext:   No Joint pain, stiffness or swelling Skin: No skin rash, easy  bruising or bleeding or hives Endoc:  No polyuria, polydipsia , polyphagia or weight change Psych: No depression, insomnia or hallucinations  Other:  All other systems negative  Physical Examination:   VS: BP (!) 144/76 (BP Location: Right Arm)    Pulse 73    Temp 98.9 F (37.2 C) (Oral)    Resp 17    Ht 4\' 10"  (1.473 m)    Wt 51.9 kg    SpO2 95%    BMI 23.93 kg/m   General Appearance: No distress  Neuro: without focal findings, mental status, speech normal, alert and oriented, cranial nerves 2-12 intact, reflexes normal and symmetric, sensation grossly normal  HEENT: PERRLA, EOM intact, no ptosis, no other lesions noticed, Mallampati: Pulmonary:.No wheezing, No rales  No rub   Cardiovascular:  Normal S1,S2.  No m/r/g.  .    Abdomen:Benign, Soft, non-tender, No masses, hepatosplenomegaly, No lymphadenopathy Endoc: No evident thyromegaly, no signs of acromegaly or Cushing features Skin:   warm, no rashes, no ecchymosis  Extremities: normal, no cyanosis, clubbing, no edema, warm with normal capillary refill. Other findings:   Labs results:   Recent Labs    06/12/20 1633  06/13/20 0100 06/13/20 0549 06/14/20 0548  HGB 10.8*  --  10.7* 10.3*  HCT 34.4*  --  34.6* 33.8*  MCV 79.6*  --  79.5* 81.1  WBC 6.8  --  7.7 9.7  BUN 27*  --  24* 25*  CREATININE 0.80  --  0.83 0.73  GLUCOSE 93  --  194* 103*  CALCIUM 9.1  --  8.8* 8.6*  INR  --  1.1  --   --   ,  TSH 0.350 - 4.500 uIU/mL 0.937  4.210 R  3.090 R  4.00 R        Assessment and Plan: Here with dyspnea, hypoxia appropriate w/u on going. Her presentation and shortness of breath is multifactorial: 1. Bronchiectasis, ? Progressive. ? Cause -sputum for MAI, pseudomonas etc -pulmonary toillet,flutter valve  2 her left diaphrapm is elevated, could it be paralyzed? -will check sniff test -incentive spirometry  3. Enlarged pulmonary arteries. ? pulm htn. She has multiple risk factors -bnp -ana, sed rate -echo -out patient continue d w/u ? Right heart cath -out patient pfts -out patient sleep study -rest and exercise o2 sats prior to d/c  4. Possible copd, she never smoked, her deceased husband did -duo nebs -out patient pfts  5. Atrial fib, cardiology following. With the loss of atrial kick, ? fask pulm edema and hence worsening shortness of breath -following cardiology recs  6. Ground glass infiltrates and nodules ? Etiology -agree with radiology follow up chest ct -will check serlogies as part of the w/u ( ana, anca, ace, ra, hypersensitivity pneumonitis panel)   Appreciate the consult Contact [phone]      I have personally obtained a history, examined the patient, evaluated laboratory and imaging results, formulated the assessment and plan and placed orders.  The Patient requires high complexity decision making for assessment and support, frequent evaluation and titration of therapies, application of advanced monitoring technologies and extensive interpretation of multiple databases.   Randolf Sansoucie,M.D. Pulmonary & Critical care Medicine [facility]

## 2020-06-14 NOTE — Telephone Encounter (Signed)
It is okay, however will need a new patient slot, since I do not know patient and not sure when next available is and she will not need to wait with her recent hospitalization. May be easier to do hospital follow up with [facility]

## 2020-06-14 NOTE — Progress Notes (Signed)
Initial Nutrition Assessment  RD working remotely.  DOCUMENTATION CODES:   Not applicable  INTERVENTION:  Provide Ensure Enlive po BID, each supplement provides 350 kcal and 20 grams of protein.  NUTRITION DIAGNOSIS:   Inadequate oral intake related to decreased appetite, nausea as evidenced by per patient/family report.  GOAL:   Patient will meet greater than or equal to 90% of their needs  MONITOR:   PO intake, Supplement acceptance, Labs, Weight trends, I & O's  REASON FOR ASSESSMENT:   Consult Assessment of nutrition requirement/status  ASSESSMENT:   84 year old female with PMHx of HLD, asthma, osteoporosis admitted with acute hypoxic respiratory failure due to suspected progressive bronchiectasis and progressive lung disease, new-onset A-fib.   Spoke with patient over the phone. She reports her appetite has been decreased for a while now and she is unsure why. She also endorses occasional nausea and dry heaves at home. She reports she used to have a good appetite and intake at baseline. She eats 3 meals per day still but reports she eats less at her meals now. She is unable to describe exact intake and reports it varies day to day but is "normal stuff." She also eats peanut butter crackers between meals. Patient is amenable to drinking Ensure Enlive to help meet calorie/protein needs. She reports she had bought some Boost for home and had recently started drinking those.  Patient reports her UBW is 115-119 lbs and that she has remained weight-stable. She is currently 51.9 kg (114.5 lbs).  Medications reviewed and include: doxycycline, prednisone 40 mg daily.  Labs reviewed: BUN 25.  Unable to determine if patient meets criteria for malnutrition at this time.  NUTRITION - FOCUSED PHYSICAL EXAM:  Unable to complete at this time as RD is working remotely.  Diet Order:   Diet Order            Diet Heart Room service appropriate? Yes; Fluid consistency: Thin  Diet  effective now                EDUCATION NEEDS:   No education needs have been identified at this time  Skin:  Skin Assessment: Reviewed RN Assessment  Last BM:  Unknown  Height:   Ht Readings from Last 1 Encounters:  06/13/20 4\' 10"  (1.473 m)   Weight:   Wt Readings from Last 1 Encounters:  06/13/20 51.9 kg   BMI:  Body mass index is 23.93 kg/m.  Estimated Nutritional Needs:   Kcal:  1500-1700  Protein:  75-85 grams  Fluid:  1.5 L/day  Jacklynn Barnacle, MS, RD, LDN Pager number available on Amion

## 2020-06-14 NOTE — Telephone Encounter (Signed)
Please review patient chart and advise if you want to take on new patient. KW

## 2020-06-14 NOTE — Telephone Encounter (Signed)
Copied from [place] [id number]. Topic: Appointment Scheduling - Scheduling Inquiry for Clinic >> Jun 14, 2020  3:00 PM Scherrie Gerlach wrote: Reason for CRM: caregiver, [name] calling to schedule pt hospital follow up. Pt does not want to see [facility] again and asking to switch to [place]. Pt will be discharged tomorrow and needs appt asap.

## 2020-06-15 ENCOUNTER — Inpatient Hospital Stay: Payer: Medicare HMO

## 2020-06-15 DIAGNOSIS — J986 Disorders of diaphragm: Secondary | ICD-10-CM

## 2020-06-15 LAB — BASIC METABOLIC PANEL
Anion gap: 8 (ref 5–15)
BUN: 32 mg/dL — ABNORMAL HIGH (ref 8–23)
CO2: 30 mmol/L (ref 22–32)
Calcium: 8.5 mg/dL — ABNORMAL LOW (ref 8.9–10.3)
Chloride: 103 mmol/L (ref 98–111)
Creatinine, Ser: 0.95 mg/dL (ref 0.44–1.00)
GFR calc Af Amer: >60 mL/min (ref >60)
GFR calc non Af Amer: 56 mL/min — ABNORMAL LOW (ref >60)
Glucose, Bld: 104 mg/dL — ABNORMAL HIGH (ref 70–99)
Potassium: 4.3 mmol/L (ref 3.5–5.1)
Sodium: 141 mmol/L (ref 135–145)

## 2020-06-15 LAB — CBC
HCT: 36.2 % (ref 36.0–46.0)
Hemoglobin: 11.1 g/dL — ABNORMAL LOW (ref 12.0–15.0)
MCH: 24.9 pg — ABNORMAL LOW (ref 26.0–34.0)
MCHC: 30.7 g/dL (ref 30.0–36.0)
MCV: 81.3 fL (ref 80.0–100.0)
Platelets: 368 10*3/uL (ref 150–400)
RBC: 4.45 MIL/uL (ref 3.87–5.11)
RDW: 15.1 % (ref 11.5–15.5)
WBC: 9.1 10*3/uL (ref 4.0–10.5)
nRBC: 0 % (ref 0.0–0.2)

## 2020-06-15 MED ORDER — PREDNISONE 10 MG PO TABS: ORAL_TABLET | ORAL | 0 refills | Status: DC

## 2020-06-15 MED ORDER — DOXYCYCLINE HYCLATE 100 MG PO TABS: 100.0000 mg | ORAL_TABLET | Freq: Two times a day (BID) | ORAL | 0 refills | Status: AC

## 2020-06-15 MED ORDER — ENSURE ENLIVE PO LIQD: 237.0000 mL | Freq: Two times a day (BID) | ORAL | 12 refills | Status: AC

## 2020-06-15 MED ORDER — DILTIAZEM HCL ER COATED BEADS 120 MG PO CP24
120.0000 mg | ORAL_CAPSULE | Freq: Every day | ORAL | Status: DC
  Administered 2020-06-15: 120 mg via ORAL
  Filled 2020-06-15: qty 1

## 2020-06-15 MED ORDER — APIXABAN 2.5 MG PO TABS: 2.5000 mg | ORAL_TABLET | Freq: Two times a day (BID) | ORAL | 0 refills | Status: DC

## 2020-06-15 MED ORDER — DILTIAZEM HCL ER COATED BEADS 120 MG PO CP24: 120.0000 mg | ORAL_CAPSULE | Freq: Every day | ORAL | 0 refills | Status: DC

## 2020-06-15 NOTE — Progress Notes (Signed)
Date: 06/15/2020,   MRN# [id number] Jackie Turner Nov 10, 1936 Code Status:     Code Status Orders  (From admission, onward)         Start     Ordered   06/13/20 0123  Full code  Continuous        06/13/20 0125        Code Status History    This patient has a current code status but no historical code status.   Advance Care Planning Activity     Hosp day:@LENGTHOFSTAYDAYS @ Referring MD: @ATDPROV @     PCP:      AdmissionWeight: 52.2 kg                 CurrentWeight: 51 kg   HPI: She has an uneventful night. Cough is less, no shortness of breath at rest. Did not ambulate. SEE last note.   PMHX:   Past Medical History:  Diagnosis Date  . Allergy   . Asthma   . Hyperlipidemia   . Osteoporosis    Surgical Hx:  Past Surgical History:  Procedure Laterality Date  . ABDOMINAL HYSTERECTOMY  2003  . BREAST BIOPSY  1976, 1978   benign  . carpal tunnel repair Bilateral    R 1990; L 1997  . EYE SURGERY Bilateral    cataract extraction  . HAND TENDON SURGERY Right    Trigger finger  . IR RADIOLOGIST EVAL & MGMT  06/13/2017  . IR VERTEBROPLASTY CERV/THOR BX INC UNI/BIL INC/INJECT/IMAGING  06/17/2017  . TONSILLECTOMY  1961   Family Hx:  Family History  Problem Relation Age of Onset  . Heart disease Mother   . CAD Mother   . Congestive Heart Failure Mother   . Osteoarthritis Mother   . Cancer Sister        breast  . Breast cancer Sister    Social Hx:   Social History   Tobacco Use  . Smoking status: Never Smoker  . Smokeless tobacco: Never Used  Vaping Use  . Vaping Use: Never used  Substance Use Topics  . Alcohol use: No  . Drug use: No   Medication:    Home Medication:    Current Medication: @CURMEDTAB @   Allergies:  Nsaids; Amoxicillin; Ibandronic acid; Actonel  [risedronate]; Antihistamines, chlorpheniramine-type; Aspirin; Buspar [buspirone]; Butalbital-aspirin-caffeine; Clarithromycin; Codeine; Gatifloxacin; Hydrocodone-guaifenesin;  Moxifloxacin; Oxycodone; Penicillins; Risedronate sodium; Tylenol with codeine #3  [acetaminophen-codeine]; and Raloxifene  Review of Systems: Gen:  Denies  fever, sweats, chills HEENT: Denies blurred vision, double vision, ear pain, eye pain, hearing loss, nose bleeds, sore throat Cvc:  No dizziness, chest pain or heaviness Resp:   Less cough and sob, no hemoptysis Gi: Denies swallowing difficulty, stomach pain, nausea or vomiting, diarrhea, constipation, bowel incontinence Gu:  Denies bladder incontinence, burning urine Ext:   No Joint pain, stiffness or swelling Skin: No skin rash, easy bruising or bleeding or hives Endoc:  No polyuria, polydipsia , polyphagia or weight change Psych: No depression, insomnia or hallucinations  Other:  All other systems negative  Physical Examination:   VS: BP (!) 127/49 (BP Location: Right Arm)   Pulse (!) 47   Temp 98.8 F (37.1 C) (Oral)   Resp 16   Ht 4\' 10"  (1.473 m)   Wt 51 kg   SpO2 97%   BMI 23.51 kg/m   General Appearance: No distress, laying flat in bed. comfortable  Neuro: without focal findings, mental status, speech normal, alert and oriented, cranial nerves 2-12 intact, reflexes normal  and symmetric, sensation grossly normal  HEENT: PERRLA, EOM intact, no ptosis, no other lesions noticedi: Pulmonary:.No wheezing, No rales  Sputum Production: minimum this am, no blood  Cardiovascular:  Normal S1,S2.  No m/r/g.  Abdominal aorta pulsation normal.    Abdomen:Benign, Soft, non-tender, No masses, hepatosplenomegaly, No lymphadenopathy Endoc: No evident thyromegaly, no signs of acromegaly or Cushing features Skin:   warm, no rashes, no ecchymosis  Extremities: normal, no cyanosis, clubbing, no edema, warm with normal capillary refill. Other findings:   Labs results:   Recent Labs    06/12/20 1633 06/13/20 0100 06/13/20 0549 06/14/20 0548 06/15/20 0515  HGB 10.8*  --  10.7* 10.3* 11.1*  HCT 34.4*  --  34.6* 33.8* 36.2  MCV  79.6*  --  79.5* 81.1 81.3  WBC 6.8  --  7.7 9.7 9.1  BUN 27*  --  24* 25* 32*  CREATININE 0.80  --  0.83 0.73 0.95  GLUCOSE 93  --  194* 103* 104*  CALCIUM 9.1  --  8.8* 8.6* 8.5*  INR  --  1.1  --   --   --      EKG:     Other:   Assessment and Plan: Here with dyspnea, hypoxia appropriate w/u on going. Her presentation and shortness of breath is multifactorial: 1. Bronchiectasis, ? Progressive. ? Cause -sputum for MAI, pseudomonas yet to be collected, if possible -pulmonary toillet, flutter valve  2 Her left diaphrapm is elevated, could it be paralyzed? -will check sniff test -incentive spirometry  3. Enlarged pulmonary arteries. ? pulm htn. She has multiple risk factors -out patient continue d w/u ? Right heart cath -out patient pfts -out patient sleep study -rest and exercise o2 sats prior to d/c  4. Possible copd, she never smoked, her deceased husband did -duo nebs -out patient pfts  5. Atrial fib, cardiology following. With the loss of atrial kick, ? fask pulm edema and hence worsening shortness of breath -following cardiology recs  6. Ground glass infiltrates and nodules ? Etiology -agree with radiology follow up chest ct -will check serlogies as part of the w/u ( ana, anca, ace, ra, hypersensitivity pneumonitis panel pending)   Appreciate the consult Contact [phone]   I have personally obtained a history, examined the patient, evaluated laboratory and imaging results, formulated the assessment and plan and placed orders.  The Patient requires high complexity decision making for assessment and support, frequent evaluation and titration of therapies, application of advanced monitoring technologies and extensive interpretation of multiple databases.   Rim Thatch,M.D. Pulmonary & Critical care Medicine [facility]

## 2020-06-15 NOTE — Discharge Summary (Signed)
Physician Discharge Summary  Jackie Turner ZWC:[id number] DOB: 1936/04/25  PCP: Mar Daring, PA-C  Admitted from: Home Discharged to: Home  Admit date: 06/12/2020 Discharge date: 06/15/2020  Recommendations for Outpatient Follow-up:    Follow-up Information    Isaias Cowman, MD. Go on 06/26/2020.   Specialty: Cardiology Contact information: [facility] [place] [address] [phone]        Erby Pian, MD. Schedule an appointment as soon as possible for a visit on 06/27/2020.   Specialty: Specialist Why: MD aware and his office will also call you to arrange the follow-up appointment.  Please call/confirm with them if you do not hear back from them in 2-3 business days. Contact information: [place] [address] [phone]        Mar Daring, PA-C. Schedule an appointment as soon as possible for a visit in 1 week(s).   Specialty: Family Medicine Why: To be seen with repeat labs (CBC & BMP). Contact information: [address] [place]  [address] [phone]                Home Health: None Equipment/Devices: Oxygen via nasal cannula 2 L/min continuously.  Discharge Condition: Improved and stable CODE STATUS: Full Diet recommendation: Heart healthy diet.  Discharge Diagnoses:  Principal Problem:   Acute on suspect chronic respiratory failure with hypoxia (HCC) Active Problems:   Bronchiectasis without complication (HCC)   Atrial fibrillation with rapid ventricular response (HCC)   Right hand pain   Fall at home, initial encounter   Rapid atrial fibrillation ([place])   New onset atrial fibrillation ([place])   COPD with acute exacerbation [facility])   Brief Summary: 84 year old female with PMH of COPD, bronchiectasis (although patient denies these), lifelong non-smoker, asthma, hyperlipidemia, initially presented to the urgent care with right hand  pain following fall out of her recliner while asleep.  She was noted to be very dyspneic while walking in the clinic and oxygen saturations were in the 80s.  She was transferred to the ED.  She reports 3 months history of progressive dyspnea, fatigue, nonproductive cough.  In the ED, tachypneic to 28, oxygen saturation 86% on room air, BNP 509, D-dimer elevated, CTA chest negative for acute PE but showed chronic bronchiectasis right middle lobe and lingula, progressive left lung base consolidation.  She was treated with bronchodilators and prednisone.  In the ED she developed A. fib with RVR, started on Cardizem bolus and infusion.  Admitted for acute on suspected chronic respiratory failure with hypoxia in the context of asthma/COPD, bronchiectasis, new onset A. fib with RVR.  Cardiology and pulmonology consulted on 6/30.   Assessment & Plan:   Acute on suspected chronic respiratory failure with hypoxia: Likely multifactorial due to pulmonary artery hypertension, chronic elevation of left hemidiaphragm with atelectasis, left lower lobe bronchiectasis, right middle lobe and lingula bronchiectasis, possible small airway disease seen on CT is groundglass appearance.  Evaluate and treat underlying causes as below.  Reassessed on day of discharge and qualifies for home oxygen as above.  Home oxygen arranged by [facility] team.  Bronchiectasis: Unclear etiology.  May be progressive.  Pulmonary input appreciated.  Recommending sputum MAI, Pseudomonas etc. and pulmonary toilet with flutter valve.  Currently on doxycycline and prednisone.  I discussed with Dr. Raul Del, cleared for discharge home today on short steroid taper and complete course of doxycycline.  He will arrange follow-up next week Tuesday in office and will follow up  extensive lab work that he ordered while patient was hospitalized.  Chronic left hemidiaphragm elevation:?  Paralyzed.    Pulmonary sniff test as below.  Incentive spirometry.   Outpatient follow-up.  Pulmonary artery hypertension: Pulmonology working up-BNP, ANA, ESR, echo,?  Right heart cath, outpatient PFTs and sleep study.  Outpatient follow-up with pulmonology.  Possible COPD/?  Asthma: Duo nebs.  Outpatient PFTs.  There seems to be some duplication on her home respiratory medication regimen.  Close outpatient follow-up with pulmonology to reassess these.  New onset A. fib with RVR: Reverted to sinus rhythm.  Transition to oral Cardizem.  However significant sinus bradycardia in the high 40s on 6/13 morning and Cardizem held.  Thereby reduced Cardizem dose to 30 mg every 6 hourly with holding parameters.  Cardiology input appreciated.  TTE shows LVEF 60 to 65%, moderate LVH.  CHA2DS2-VASc score: 4.  They have initiated Eliquis 2.5 mg twice daily.  TSH normal.  Cardiology follow-up appreciated.  Indicate that she is sinus bradycardia with resting heart rates in the 40s-50s which increased to 60s-70s with movement/ambulation.  They have switched Cardizem to Cardizem CD 120 mg daily and recommend continuing low-dose Eliquis for stroke prevention and have arranged follow-up with outpatient cardiology at which time prolonged cardiac monitor will be ordered.  They have cleared her for discharge home.  Groundglass infiltrates versus nodules on CT chest: Needs CT follow-up as outpatient.  Pulmonology input appreciated and checking serologies as part of work-up (ANA, ANCA, ACE I, RA and hypersensitivity pneumonitis panel.  Outpatient follow-up with pulmonology.  Right hand pain: Traumatic.  No fracture.  Seems to have resolved.  Essential hypertension: Verapamil discontinued.  Currently on Cardizem.  Controlled.  Hyperactive bladder: Continue home oxytocin.  Unclear as to why she takes doxepin.  Defer management to her PCP or prescribing MD.  Microcytic anemia: Stable.  Outpatient evaluation.  May be chronic disease versus iron deficiency.  Body mass index is 23.93  kg/m.  Nutritional Status Nutrition Problem: Inadequate oral intake Etiology: decreased appetite, nausea Signs/Symptoms: per patient/family report Interventions: Ensure Enlive (each supplement provides 350kcal and 20 grams of protein)   Consultants:   Cardiology Pulmonology  Procedures:   None   Discharge Instructions  Discharge Instructions    Amb referral to AFIB Clinic   Complete by: As directed    Ambulatory referral to Pulmonology   Complete by: As directed    Reason for referral: Interstitial Lung Disease(ILD)   Call MD for:  difficulty breathing, headache or visual disturbances   Complete by: As directed    Call MD for:  extreme fatigue   Complete by: As directed    Call MD for:  persistant dizziness or light-headedness   Complete by: As directed    Call MD for:  persistant nausea and vomiting   Complete by: As directed    Call MD for:  severe uncontrolled pain   Complete by: As directed    Call MD for:  temperature >100.4   Complete by: As directed    Diet - low sodium heart healthy   Complete by: As directed    Discharge instructions   Complete by: As directed    Oxygen via nasal cannula at 2 L/min continuously.   Increase activity slowly   Complete by: As directed        Medication List    STOP taking these medications   verapamil 180 MG CR tablet Commonly known as: CALAN-SR     TAKE these medications  Advair Diskus 250-50 MCG/DOSE Aepb Generic drug: Fluticasone-Salmeterol Inhale 1 puff into the lungs daily.   apixaban 2.5 MG Tabs tablet Commonly known as: ELIQUIS Take 1 tablet (2.5 mg total) by mouth 2 (two) times daily.   diltiazem 120 MG 24 hr capsule Commonly known as: CARDIZEM CD Take 1 capsule (120 mg total) by mouth daily. Start taking on: June 16, 2020   doxepin 10 MG capsule Commonly known as: SINEQUAN 1 Capsule(s) By Mouth 4-5 Times Daily PRN   doxycycline 100 MG tablet Commonly known as: VIBRA-TABS Take 1 tablet (100  mg total) by mouth 2 (two) times daily for 3 days.   feeding supplement (ENSURE ENLIVE) Liqd Take 237 mLs by mouth 2 (two) times daily between meals. Start taking on: June 16, 2020   fluticasone 50 MCG/ACT nasal spray Commonly known as: FLONASE Place into both nostrils.   oxybutynin 15 MG 24 hr tablet Commonly known as: DITROPAN XL Take 15 mg by mouth at bedtime.   predniSONE 10 MG tablet Commonly known as: DELTASONE Take 3 tabs (30 mg total) daily for 3 days, then 2 tabs (20 mg total) daily for 3 days, then 1 tab (10 mg total) daily for 3 days, then stop.   Spiriva Respimat 1.25 MCG/ACT Aers Generic drug: Tiotropium Bromide Monohydrate Inhale 2 puffs into the lungs daily.   Trelegy Ellipta 100-62.5-25 MCG/INH Aepb Generic drug: Fluticasone-Umeclidin-Vilant Inhale 1 puff into the lungs daily.   Ultram 50 MG tablet Generic drug: traMADol Take 1 tablet (50 mg total) by mouth every 6 (six) hours as needed.   Ventolin HFA 108 (90 Base) MCG/ACT inhaler Generic drug: albuterol INHALE TWO PUFFS INTO THE LUNGS EVERY 4 HOURS AS NEEDED FOR WHEEZING OR SHORTNESS OF BREATH      Allergies  Allergen Reactions  . Nsaids Other (See Comments)    History of gastric ulcer  . Amoxicillin Nausea Only and Rash  . Ibandronic Acid Other (See Comments)    Muscle weakness - advised not to take  **BONIVA** - drug name  . Actonel  [Risedronate] Other (See Comments)    Gastric ulcers  . Antihistamines, Chlorpheniramine-Type   . Aspirin     Gastric ulcer  . Buspar [Buspirone] Other (See Comments)    Pt does not remember reaction   . Butalbital-Aspirin-Caffeine Other (See Comments)    Other reaction(s): Unknown  . Clarithromycin Other (See Comments)    Bloating  *BIAXIN*  . Codeine   . Gatifloxacin Other (See Comments)  . Hydrocodone-Guaifenesin Nausea Only    CODICLEAR DH STYRUP   . Moxifloxacin   . Oxycodone Other (See Comments)    Dizziness  . Penicillins Other (See Comments)  .  Risedronate Sodium     Gastric ulcer  . Tylenol With Codeine #3  [Acetaminophen-Codeine]     Other reaction(s): Unknown  . Raloxifene Other (See Comments)    Bloating Bloating  *EVISTA*      Procedures/Studies: DG Chest 2 View  Result Date: 06/12/2020 CLINICAL DATA:  Shortness of breath. EXAM: CHEST - 2 VIEW COMPARISON:  January 29, 2019. FINDINGS: Stable cardiomegaly. No pneumothorax or pleural effusion is noted. Stable elevation of left hemidiaphragm is noted. Minimal bibasilar subsegmental atelectasis is noted. Old lower thoracic compression fracture is noted. IMPRESSION: Minimal bibasilar subsegmental atelectasis. Electronically Signed   By: Marijo Conception M.D.   On: 06/12/2020 16:53   CT Angio Chest PE W/Cm &/Or Wo Cm  Result Date: 06/12/2020 CLINICAL DATA:  Shortness of breath. EXAM: CT  ANGIOGRAPHY CHEST WITH CONTRAST TECHNIQUE: Multidetector CT imaging of the chest was performed using the standard protocol during bolus administration of intravenous contrast. Multiplanar CT image reconstructions and MIPs were obtained to evaluate the vascular anatomy. CONTRAST:  90m OMNIPAQUE IOHEXOL 350 MG/ML SOLN COMPARISON:  Radiograph earlier this day.  Chest CT 11/13/2015 FINDINGS: Cardiovascular: There are no filling defects within the pulmonary arteries to suggest pulmonary embolus. There is dilatation of the main pulmonary artery of 4.4 cm. Tortuous atherosclerotic thoracic aorta without dissection or acute aortic findings. Mild multi chamber cardiomegaly. No pericardial effusion. Mediastinum/Nodes: Prominent subcarinal node measuring 14 mm, likely reactive. No other enlarged mediastinal or hilar lymph nodes. No visualized thyroid nodule. Decompressed esophagus. Lungs/Pleura: Motion artifact limitations, particularly through the apices and bases. Chronic but progressive elevation of the left hemidiaphragm. Left lower lobe bronchiectasis with progressive left lung base consolidation.  Paramediastinal bronchiectasis in the right middle lobe, chronic. Heterogeneous pulmonary parenchyma with areas of ground-glass. Some of these ground-glass areas appear nodular, for example left upper lobe 7 mm nodular ground-glass opacities are ease 6 image 20. There is a perifissural nodule in the right upper lobe measuring 5 mm, series 6, image 27, that may be solid but motion obscures accurate characterization. Minimal adjacent perifissural consolidation in the right upper lobe. Upper Abdomen: Partially motion obscured. Elevated left hemidiaphragm. Musculoskeletal: Chronic T10 compression fracture with vertebral augmentation, however new from 2016. There is a mild T9 superior endplate compression fracture, chronic based on thoracic MRI 02/18/2018. No acute osseous abnormalities are seen. Review of the MIP images confirms the above findings. IMPRESSION: 1. No pulmonary embolus. Dilated main pulmonary artery consistent with pulmonary arterial hypertension. 2. Chronic but progressive elevation of the left hemidiaphragm. Progressive left lung base consolidation, favor atelectasis related to diaphragm elevation. Left lower lobe bronchiectasis is again seen but primarily obscured. 3. Heterogeneous pulmonary parenchyma with areas of ground-glass opacity. Favor small airways disease, however some of these areas appear nodular. Recommend CT follow-up as a ground-glass nodule. Recommend CT at 6-12 months to confirm persistence. 4. Right upper lobe 5 mm perifissural nodule may be solid but motion obscures accurate characterization. Recommend attention to this at follow-up. 5. Chronic bronchiectasis in the right middle lobe and lingula. Aortic Atherosclerosis (ICD10-I70.0). Electronically Signed   By: MKeith RakeM.D.   On: 06/12/2020 23:16   DG Hand 2 View Right  Result Date: 06/12/2020 CLINICAL DATA:  Fall EXAM: RIGHT HAND - 2 VIEW COMPARISON:  None. FINDINGS: No fracture or malalignment. Mild degenerative  changes at the D IP and PIP joints. IMPRESSION: No acute osseous abnormality. Electronically Signed   By: KDonavan FoilM.D.   On: 06/12/2020 21:42   DG Sniff Test  Result Date: 06/15/2020 CLINICAL DATA:  Elevated left hemidiaphragm. EXAM: CHEST FLUOROSCOPY TECHNIQUE: Real-time fluoroscopic evaluation of the chest was performed. FLUOROSCOPY TIME:  Fluoroscopy Time: 1 minutes 0 seconds Radiation Exposure Index (if provided by the fluoroscopic device): 5.2 mGy Number of Acquired Spot Images: 3 COMPARISON:  CT chest 06/12/2020.  Chest x-ray 01/29/2019. FINDINGS: Stable elevation left hemidiaphragm. Mild left base subsegmental atelectasis and or scarring. Bilateral diaphragmatic movement noted with respiration. IMPRESSION: Stable elevation left hemidiaphragm. Bilateral diaphragmatic movement with respiration. Electronically Signed   By: TMarcello Moores Register   On: 06/15/2020 13:49   ECHOCARDIOGRAM COMPLETE  Result Date: 06/13/2020    ECHOCARDIOGRAM REPORT   Patient Name:   PJERIANNE ANSELMOCOOPER Date of Exam: 06/13/2020 Medical Rec #:  [id number]  Height:       58.0 in Accession #:    [id number]            Weight:       115.0 lb Date of Birth:  1936-09-07             BSA:          1.439 m Patient Age:    59 years              BP:           133/79 mmHg Patient Gender: F                     HR:           58 bpm. Exam Location:  [facility] Procedure: 2D Echo, Cardiac Doppler and Color Doppler Indications:     Atrial Fibrillation 427.31  History:         Patient has no prior history of Echocardiogram examinations.                  Risk Factors:Dyslipidemia.  Sonographer:     Sherrie Sport RDCS (AE) Referring Phys:  [id number] Barb Merino Diagnosing Phys: Nelva Bush MD  Sonographer Comments: Suboptimal parasternal window. IMPRESSIONS  1. Left ventricular ejection fraction, by estimation, is 60 to 65%. The left ventricle has normal function. Left ventricular endocardial border not optimally defined to evaluate regional  wall motion. There is moderate left ventricular hypertrophy. Left ventricular diastolic parameters are consistent with Grade I diastolic dysfunction (impaired relaxation). Elevated left atrial pressure.  2. Right ventricular systolic function is normal. The right ventricular size is mildly enlarged. Tricuspid regurgitation signal is inadequate for assessing PA pressure.  3. Left atrial size was mildly dilated.  4. Right atrial size was mildly dilated.  5. The mitral valve is grossly normal. Mild mitral valve regurgitation.  6. The aortic valve was not well visualized. Aortic valve regurgitation is trivial. No aortic stenosis is present.  7. Pulmonic valve regurgitation not well assessed. FINDINGS  Left Ventricle: Left ventricular ejection fraction, by estimation, is 60 to 65%. The left ventricle has normal function. Left ventricular endocardial border not optimally defined to evaluate regional wall motion. The left ventricular internal cavity size was normal in size. There is moderate left ventricular hypertrophy. Left ventricular diastolic parameters are consistent with Grade I diastolic dysfunction (impaired relaxation). Elevated left atrial pressure. Right Ventricle: The right ventricular size is mildly enlarged. Right vetricular wall thickness was not assessed. Right ventricular systolic function is normal. Tricuspid regurgitation signal is inadequate for assessing PA pressure. Left Atrium: Left atrial size was mildly dilated. Right Atrium: Right atrial size was mildly dilated. Pericardium: There is no evidence of pericardial effusion. Mitral Valve: The mitral valve is grossly normal. Mild mitral valve regurgitation. Tricuspid Valve: The tricuspid valve is not well visualized. Tricuspid valve regurgitation is trivial. Aortic Valve: The aortic valve was not well visualized. Aortic valve regurgitation is trivial. No aortic stenosis is present. Aortic valve mean gradient measures 5.0 mmHg. Aortic valve peak  gradient measures 8.5 mmHg. Aortic valve area, by VTI measures 2.34 cm. Pulmonic Valve: The pulmonic valve was not well visualized. Pulmonic valve regurgitation not well assessed. Aorta: The aortic root was not well visualized. Pulmonary Artery: The pulmonary artery is not well seen. Venous: The inferior vena cava was not well visualized. IAS/Shunts: The interatrial septum was not well visualized.  LEFT VENTRICLE PLAX 2D LVIDd:  3.81 cm  Diastology LVIDs:         2.67 cm  LV e' lateral:   9.79 cm/s LV PW:         1.26 cm  LV E/e' lateral: 6.4 LV IVS:        0.93 cm  LV e' medial:    3.92 cm/s LVOT diam:     2.00 cm  LV E/e' medial:  16.1 LV SV:         70 LV SV Index:   49 LVOT Area:     3.14 cm  RIGHT VENTRICLE RV Basal diam:  4.01 cm RV S prime:     13.80 cm/s LEFT ATRIUM             Index       RIGHT ATRIUM           Index LA diam:        3.90 cm 2.71 cm/m  RA Area:     20.90 cm LA Vol (A2C):   53.3 ml 37.03 ml/m RA Volume:   61.20 ml  42.52 ml/m LA Vol (A4C):   69.2 ml 48.07 ml/m LA Biplane Vol: 60.8 ml 42.24 ml/m  AORTIC VALVE AV Area (Vmax):    2.40 cm AV Area (Vmean):   2.33 cm AV Area (VTI):     2.34 cm AV Vmax:           145.50 cm/s AV Vmean:          105.500 cm/s AV VTI:            0.299 m AV Peak Grad:      8.5 mmHg AV Mean Grad:      5.0 mmHg LVOT Vmax:         111.00 cm/s LVOT Vmean:        78.100 cm/s LVOT VTI:          0.223 m LVOT/AV VTI ratio: 0.75  AORTA Ao Root diam: 3.60 cm MITRAL VALVE MV Area (PHT): 3.45 cm    SHUNTS MV Decel Time: 220 msec    Systemic VTI:  0.22 m MV E velocity: 63.00 cm/s  Systemic Diam: 2.00 cm MV A velocity: 92.10 cm/s MV E/A ratio:  0.68 Harrell Gave End MD Electronically signed by Nelva Bush MD Signature Date/Time: 06/13/2020/6:29:19 PM    Final       Subjective: Overall feels much better.  Less dyspneic on exertion also less hypoxic on activity as per daughter at bedside.  Feels stronger.  No chest pain or cough reported.  Discharge  Exam:  Vitals:   06/15/20 1033 06/15/20 1038 06/15/20 1106 06/15/20 1503  BP:   (!) 127/49 133/82  Pulse:   (!) 47 92  Resp:   16 17  Temp:   98.8 F (37.1 C) 98.7 F (37.1 C)  TempSrc:   Oral Oral  SpO2: 95% (!) 84% 97% 92%  Weight:      Height:        General exam: Elderly female, small built and frail, chronically ill looking lying comfortably propped up in bed. Respiratory system:  Few coarse chronic sounding crackles in the bases but otherwise clear to auscultation without wheezing or rhonchi. Respiratory effort normal. Cardiovascular system: S1 & S2 heard, regular bradycardia/sinus rhythm. No JVD, murmurs, rubs, gallops or clicks. No pedal edema.   Gastrointestinal system: Abdomen is nondistended, soft and nontender. No organomegaly or masses felt. Normal bowel sounds heard. Central nervous system: Alert and oriented.  No focal neurological deficits. Extremities: Symmetric 5 x 5 power. Skin: No rashes, lesions or ulcers Psychiatry: Judgement and insight appear normal. Mood & affect appropriate.     The results of significant diagnostics from this hospitalization (including imaging, microbiology, ancillary and laboratory) are listed below for reference.     Microbiology: Recent Results (from the past 240 hour(s))  SARS Coronavirus 2 by RT PCR (hospital order, performed in [facility] hospital lab) Nasopharyngeal Nasopharyngeal Swab     Status: None   Collection Time: 06/13/20 12:03 AM   Specimen: Nasopharyngeal Swab  Result Value Ref Range Status   SARS Coronavirus 2 NEGATIVE NEGATIVE Final    Comment: (NOTE) SARS-CoV-2 target nucleic acids are NOT DETECTED.  The SARS-CoV-2 RNA is generally detectable in upper and lower respiratory specimens during the acute phase of infection. The lowest concentration of SARS-CoV-2 viral copies this assay can detect is 250 copies / mL. A negative result does not preclude SARS-CoV-2 infection and should not be used as the sole basis  for treatment or other patient management decisions.  A negative result may occur with improper specimen collection / handling, submission of specimen other than nasopharyngeal swab, presence of viral mutation(s) within the areas targeted by this assay, and inadequate number of viral copies (<250 copies / mL). A negative result must be combined with clinical observations, patient history, and epidemiological information.  Fact Sheet for Patients:   StrictlyIdeas.no  Fact Sheet for Healthcare Providers: BankingDealers.co.za  This test is not yet approved or  cleared by the [place] FDA and has been authorized for detection and/or diagnosis of SARS-CoV-2 by FDA under an Emergency Use Authorization (EUA).  This EUA will remain in effect (meaning this test can be used) for the duration of the COVID-19 declaration under Section 564(b)(1) of the Act, 21 U.S.C. section 360bbb-3(b)(1), unless the authorization is terminated or revoked sooner.  Performed at [facility], [address] [place], [place] [address]      Labs: CBC: Recent Labs  Lab 06/12/20 1633 06/13/20 0549 06/14/20 0548 06/15/20 0515  WBC 6.8 7.7 9.7 9.1  HGB 10.8* 10.7* 10.3* 11.1*  HCT 34.4* 34.6* 33.8* 36.2  MCV 79.6* 79.5* 81.1 81.3  PLT 371 379 385 031    Basic Metabolic Panel: Recent Labs  Lab 06/12/20 1633 06/13/20 0549 06/14/20 0548 06/15/20 0515  NA 142 140 140 141  K 4.5 4.6 4.2 4.3  CL 101 100 100 103  CO2 30 30 32 30  GLUCOSE 93 194* 103* 104*  BUN 27* 24* 25* 32*  CREATININE 0.80 0.83 0.73 0.95  CALCIUM 9.1 8.8* 8.6* 8.5*    Thyroid function studies Recent Labs    06/13/20 0549  TSH 0.937   Discussed in detail with patient's daughter at bedside, updated care and answered all questions.   Time coordinating discharge: 45 minutes  SIGNED:  Vernell Leep, MD, [place], [facility]. [facility]  To contact the attending  provider between 7A-7P or the covering provider during after hours 7P-7A, please log into the web site [email or web address] and access using universal [place] password for that web site. If you do not have the password, please call the hospital operator.

## 2020-06-15 NOTE — Progress Notes (Signed)
[facility] Cardiology    SUBJECTIVE: The patient reports feeling well this morning without palpitations, chest pain, or shortness of breath while on supplemental oxygen. She slept well last night.    Vitals:   06/14/20 1600 06/14/20 2028 06/14/20 2233 06/15/20 0417  BP: (!) 144/76 102/88 (!) 158/81 (!) 119/58  Pulse: 73 62  (!) 43  Resp: 17 20  20   Temp: 98.9 F (37.2 C) 98.3 F (36.8 C)  98 F (36.7 C)  TempSrc: Oral   Oral  SpO2: 95% 97%  94%  Weight:    51 kg  Height:         Intake/Output Summary (Last 24 hours) at 06/15/2020 0748 Last data filed at 06/14/2020 2350 Gross per 24 hour  Intake 240 ml  Output 200 ml  Net 40 ml      PHYSICAL EXAM  General: Well developed, well nourished, in no acute distress, lying supine in bed, appears comfortable and smiling HEENT:  Normocephalic and atramatic Neck:   No JVD.  Lungs: normal effort of breathing on supplemental oxygen via nasal cannula, speaking in full sentences, diminished basilar breath sounds, no crackles  Heart: regular rhythm, bradycardic. Normal S1 and S2 without gallops or murmurs.  Abdomen: nondistended Msk:  Back normal,  Gait not assessed. No obvious deformities. Extremities: No clubbing, cyanosis or edema.   Neuro: Alert and oriented X 3. Psych:  Good affect, responds appropriately   LABS: Basic Metabolic Panel: Recent Labs    06/14/20 0548 06/15/20 0515  NA 140 141  K 4.2 4.3  CL 100 103  CO2 32 30  GLUCOSE 103* 104*  BUN 25* 32*  CREATININE 0.73 0.95  CALCIUM 8.6* 8.5*   Liver Function Tests: No results for input(s): AST, ALT, ALKPHOS, BILITOT, PROT, ALBUMIN in the last 72 hours. No results for input(s): LIPASE, AMYLASE in the last 72 hours. CBC: Recent Labs    06/14/20 0548 06/15/20 0515  WBC 9.7 9.1  HGB 10.3* 11.1*  HCT 33.8* 36.2  MCV 81.1 81.3  PLT 385 368   Cardiac Enzymes: No results for input(s): CKTOTAL, CKMB, CKMBINDEX, TROPONINI in the last 72 hours. BNP: Invalid input(s):  POCBNP D-Dimer: No results for input(s): DDIMER in the last 72 hours. Hemoglobin A1C: No results for input(s): HGBA1C in the last 72 hours. Fasting Lipid Panel: No results for input(s): CHOL, HDL, LDLCALC, TRIG, CHOLHDL, LDLDIRECT in the last 72 hours. Thyroid Function Tests: Recent Labs    06/13/20 0549  TSH 0.937   Anemia Panel: No results for input(s): VITAMINB12, FOLATE, FERRITIN, TIBC, IRON, RETICCTPCT in the last 72 hours.  ECHOCARDIOGRAM COMPLETE  Result Date: 06/13/2020    ECHOCARDIOGRAM REPORT   Patient Name:   Jackie Turner Date of Exam: 06/13/2020 Medical Rec #:  [id number]             Height:       58.0 in Accession #:    [id number]            Weight:       115.0 lb Date of Birth:  December 25, 1935             BSA:          1.439 m Patient Age:    84 years              BP:           133/79 mmHg Patient Gender: F  HR:           58 bpm. Exam Location:  [facility] Procedure: 2D Echo, Cardiac Doppler and Color Doppler Indications:     Atrial Fibrillation 427.31  History:         Patient has no prior history of Echocardiogram examinations.                  Risk Factors:Dyslipidemia.  Sonographer:     Sherrie Sport RDCS (AE) Referring Phys:  [id number] Barb Merino Diagnosing Phys: Nelva Bush MD  Sonographer Comments: Suboptimal parasternal window. IMPRESSIONS  1. Left ventricular ejection fraction, by estimation, is 60 to 65%. The left ventricle has normal function. Left ventricular endocardial border not optimally defined to evaluate regional wall motion. There is moderate left ventricular hypertrophy. Left ventricular diastolic parameters are consistent with Grade I diastolic dysfunction (impaired relaxation). Elevated left atrial pressure.  2. Right ventricular systolic function is normal. The right ventricular size is mildly enlarged. Tricuspid regurgitation signal is inadequate for assessing PA pressure.  3. Left atrial size was mildly dilated.  4. Right atrial size was  mildly dilated.  5. The mitral valve is grossly normal. Mild mitral valve regurgitation.  6. The aortic valve was not well visualized. Aortic valve regurgitation is trivial. No aortic stenosis is present.  7. Pulmonic valve regurgitation not well assessed. FINDINGS  Left Ventricle: Left ventricular ejection fraction, by estimation, is 60 to 65%. The left ventricle has normal function. Left ventricular endocardial border not optimally defined to evaluate regional wall motion. The left ventricular internal cavity size was normal in size. There is moderate left ventricular hypertrophy. Left ventricular diastolic parameters are consistent with Grade I diastolic dysfunction (impaired relaxation). Elevated left atrial pressure. Right Ventricle: The right ventricular size is mildly enlarged. Right vetricular wall thickness was not assessed. Right ventricular systolic function is normal. Tricuspid regurgitation signal is inadequate for assessing PA pressure. Left Atrium: Left atrial size was mildly dilated. Right Atrium: Right atrial size was mildly dilated. Pericardium: There is no evidence of pericardial effusion. Mitral Valve: The mitral valve is grossly normal. Mild mitral valve regurgitation. Tricuspid Valve: The tricuspid valve is not well visualized. Tricuspid valve regurgitation is trivial. Aortic Valve: The aortic valve was not well visualized. Aortic valve regurgitation is trivial. No aortic stenosis is present. Aortic valve mean gradient measures 5.0 mmHg. Aortic valve peak gradient measures 8.5 mmHg. Aortic valve area, by VTI measures 2.34 cm. Pulmonic Valve: The pulmonic valve was not well visualized. Pulmonic valve regurgitation not well assessed. Aorta: The aortic root was not well visualized. Pulmonary Artery: The pulmonary artery is not well seen. Venous: The inferior vena cava was not well visualized. IAS/Shunts: The interatrial septum was not well visualized.  LEFT VENTRICLE PLAX 2D LVIDd:         3.81  cm  Diastology LVIDs:         2.67 cm  LV e' lateral:   9.79 cm/s LV PW:         1.26 cm  LV E/e' lateral: 6.4 LV IVS:        0.93 cm  LV e' medial:    3.92 cm/s LVOT diam:     2.00 cm  LV E/e' medial:  16.1 LV SV:         70 LV SV Index:   49 LVOT Area:     3.14 cm  RIGHT VENTRICLE RV Basal diam:  4.01 cm RV S prime:     13.80 cm/s LEFT  ATRIUM             Index       RIGHT ATRIUM           Index LA diam:        3.90 cm 2.71 cm/m  RA Area:     20.90 cm LA Vol (A2C):   53.3 ml 37.03 ml/m RA Volume:   61.20 ml  42.52 ml/m LA Vol (A4C):   69.2 ml 48.07 ml/m LA Biplane Vol: 60.8 ml 42.24 ml/m  AORTIC VALVE AV Area (Vmax):    2.40 cm AV Area (Vmean):   2.33 cm AV Area (VTI):     2.34 cm AV Vmax:           145.50 cm/s AV Vmean:          105.500 cm/s AV VTI:            0.299 m AV Peak Grad:      8.5 mmHg AV Mean Grad:      5.0 mmHg LVOT Vmax:         111.00 cm/s LVOT Vmean:        78.100 cm/s LVOT VTI:          0.223 m LVOT/AV VTI ratio: 0.75  AORTA Ao Root diam: 3.60 cm MITRAL VALVE MV Area (PHT): 3.45 cm    SHUNTS MV Decel Time: 220 msec    Systemic VTI:  0.22 m MV E velocity: 63.00 cm/s  Systemic Diam: 2.00 cm MV A velocity: 92.10 cm/s MV E/A ratio:  0.68 Christopher End MD Electronically signed by Nelva Bush MD Signature Date/Time: 06/13/2020/6:29:19 PM    Final      Echo normal left ventricular function with LVEF 60-65% with moderate LVH, mild mitral regurgitation  TELEMETRY: sinus bradycardia 45 bpm  ASSESSMENT AND PLAN:  Principal Problem:   Acute on suspect chronic respiratory failure with hypoxia (HCC) Active Problems:   Bronchiectasis without complication (HCC)   Atrial fibrillation with rapid ventricular response (HCC)   Right hand pain   Fall at home, initial encounter   Rapid atrial fibrillation ([place])   New onset atrial fibrillation (HCC)   COPD with acute exacerbation ([place])    1. New onset atrial fibrillation with RVR, in the setting of acute hypoxic respiratory failure  due to possible bronchiectasis and possible progressive lung disease, converted to sinus rhythm with Cardizem drip. No prior ECG revealing atrial fibrillation, but patient reports experiencing palpitations with heart racing on a nightly basis. 2D echocardiogram revealed normal left ventricular function with LVEF 60-65% with moderate LVH, mild mitral regurgitation. [place] vasc score of 4 (age-23, female-1, HTN-1). The risks and benefits of chronic anticoagulation were discussed with the patient and her granddaughter, and patient agrees to initiate anticoagulation. Currently in sinus bradycardia at rest with rates in the 40s-50s,  and 60-low 70s with movement/ambulation. 2. Acute hypoxic respiratory failure, secondary to bronchiectasis, possible progressive lung disease, and possible pulmonary hypertension with enlarged pulmonary arteries, started on doxycycline, duonebs, and prednisone, and supplemental oxygen without home oxygen use. Being followed by pulmonary.   Recommendations: 1. Agree with current therapy 2. Continue low dose Eliquis for stroke prevention 3. Switch to Cardizem CD 120 mg 4. Follow-up with Dr. Saralyn Pilar as outpatient at which time prolonged cardiac monitoring will be ordered 5. No further cardiac diagnostics recommended at this time  Sign off for now; please call/[facility] with any questions.  Clabe Seal, PA-C 06/15/2020 7:48 AM

## 2020-06-15 NOTE — TOC Progression Note (Signed)
Transition of Care [facility]) - Progression Note    Patient Details  Name: Jackie Turner MRN: [id number] Date of Birth: 04/21/1936  Transition of Care [facility]) CM/SW [place], RN Phone Number: 06/15/2020, 2:49 PM  Clinical Narrative:     Patient qualified for [facility] oxygen continuous, this was ordered through [facility] and delivered to room.         Expected Discharge Plan and Services                           DME Arranged: Oxygen DME Agency: Other - Comment [facility]) Date DME Agency Contacted: 06/15/20 Time DME Agency Contacted: [phone] Representative spoke with at DME Agency: [place] ([place] Interventions    Readmission Risk Interventions No flowsheet data found.

## 2020-06-15 NOTE — Telephone Encounter (Signed)
Patient scheduled for a hospital follow up with [facility] on 7/8.

## 2020-06-15 NOTE — Progress Notes (Signed)
SATURATION QUALIFICATIONS: (This note is used to comply with regulatory documentation for home oxygen)  Patient Saturations on Room Air at Rest = 84%  Patient Saturations on Room Air while Ambulating = 84%  Patient Saturations on 2 Liters of oxygen while Ambulating = 92%  Please briefly explain why patient needs home oxygen:  CHF

## 2020-06-15 NOTE — Plan of Care (Signed)
  Problem: Clinical Measurements: Goal: Ability to maintain clinical measurements within normal limits will improve Outcome: Progressing   Problem: Activity: Goal: Risk for activity intolerance will decrease Outcome: Progressing   Problem: Coping: Goal: Level of anxiety will decrease Outcome: Progressing   Problem: Safety: Goal: Ability to remain free from injury will improve Outcome: Progressing   

## 2020-06-16 LAB — ANGIOTENSIN CONVERTING ENZYME: Angiotensin-Converting Enzyme: 60 U/L (ref 14–82)

## 2020-06-16 LAB — ANA W/REFLEX: Anti Nuclear Antibody (ANA): NEGATIVE

## 2020-06-20 LAB — HYPERSENSITIVITY PNEUMONITIS
A. Pullulans Abs: NEGATIVE
A.Fumigatus #1 Abs: NEGATIVE
Micropolyspora faeni, IgG: NEGATIVE
Pigeon Serum Abs: NEGATIVE
Thermoact. Saccharii: NEGATIVE
Thermoactinomyces vulgaris, IgG: NEGATIVE

## 2020-06-22 ENCOUNTER — Encounter: Payer: Self-pay | Admitting: Physician Assistant

## 2020-06-22 ENCOUNTER — Ambulatory Visit (INDEPENDENT_AMBULATORY_CARE_PROVIDER_SITE_OTHER): Payer: Medicare HMO | Admitting: Physician Assistant

## 2020-06-22 ENCOUNTER — Other Ambulatory Visit: Payer: Self-pay | Admitting: Physician Assistant

## 2020-06-22 ENCOUNTER — Other Ambulatory Visit: Payer: Self-pay

## 2020-06-22 VITALS — BP 134/85 | HR 93 | Temp 98.9°F | Resp 20 | Wt 113.0 lb

## 2020-06-22 DIAGNOSIS — J9621 Acute and chronic respiratory failure with hypoxia: Secondary | ICD-10-CM | POA: Diagnosis not present

## 2020-06-22 DIAGNOSIS — I4891 Unspecified atrial fibrillation: Secondary | ICD-10-CM

## 2020-06-22 DIAGNOSIS — J4540 Moderate persistent asthma, uncomplicated: Secondary | ICD-10-CM

## 2020-06-22 DIAGNOSIS — K580 Irritable bowel syndrome with diarrhea: Secondary | ICD-10-CM

## 2020-06-22 DIAGNOSIS — J479 Bronchiectasis, uncomplicated: Secondary | ICD-10-CM

## 2020-06-22 DIAGNOSIS — N3010 Interstitial cystitis (chronic) without hematuria: Secondary | ICD-10-CM

## 2020-06-22 DIAGNOSIS — F324 Major depressive disorder, single episode, in partial remission: Secondary | ICD-10-CM

## 2020-06-22 MED ORDER — DILTIAZEM HCL ER COATED BEADS 120 MG PO CP24: 120.0000 mg | ORAL_CAPSULE | Freq: Every day | ORAL | 1 refills | Status: DC

## 2020-06-22 MED ORDER — TRELEGY ELLIPTA 100-62.5-25 MCG/INH IN AEPB: 1.0000 | INHALATION_SPRAY | Freq: Every day | RESPIRATORY_TRACT | 11 refills | Status: DC

## 2020-06-22 MED ORDER — OXYBUTYNIN CHLORIDE ER 15 MG PO TB24: 15.0000 mg | ORAL_TABLET | Freq: Every day | ORAL | 1 refills | Status: DC

## 2020-06-22 MED ORDER — APIXABAN 2.5 MG PO TABS: 2.5000 mg | ORAL_TABLET | Freq: Two times a day (BID) | ORAL | 1 refills | Status: DC

## 2020-06-22 MED ORDER — DICYCLOMINE HCL 10 MG PO CAPS: 10.0000 mg | ORAL_CAPSULE | Freq: Three times a day (TID) | ORAL | 1 refills | Status: DC | PRN

## 2020-06-22 MED ORDER — PENTOSAN POLYSULFATE SODIUM 100 MG PO CAPS: 100.0000 mg | ORAL_CAPSULE | Freq: Two times a day (BID) | ORAL | 1 refills | Status: DC

## 2020-06-22 MED ORDER — DOXEPIN HCL 10 MG PO CAPS: 30.0000 mg | ORAL_CAPSULE | Freq: Every day | ORAL | 1 refills | Status: DC

## 2020-06-22 MED ORDER — VENTOLIN HFA 108 (90 BASE) MCG/ACT IN AERS: INHALATION_SPRAY | RESPIRATORY_TRACT | 5 refills | Status: DC

## 2020-06-22 NOTE — Assessment & Plan Note (Signed)
Currently doing well. Continue Trelegy one puff daily. Ventolin is to be used for rescue inhaler and can be used q 4-6 hrs prn for SOB and wheezing.  Has appt with Dr. Patsey Berthold, Pulmonology, on 07/11/20.

## 2020-06-22 NOTE — Progress Notes (Signed)
I,Roshena L Chambers,acting as a scribe for [employer], PA-C.,have documented all relevant documentation on the behalf of Mar Daring, PA-C,as directed by  Mar Daring, PA-C while in the presence of Mar Daring, [place].  Established patient visit   Patient: Jackie Turner   DOB: 11-30-1936   84 y.o. Female  MRN: [id number] Visit Date: 06/22/2020  Today's healthcare provider: Mar Daring, PA-C   Chief Complaint  Patient presents with  . Hospitalization Follow-up   Subjective    HPI  Follow up Hospitalization  Patient was admitted to [facility] on 06/12/2020 and discharged on 06/15/2020. She was treated for atrial fibrillation, asthma exacerbation, COPD, acute on chronic respiratory failure and bronchiectasis. Treatment for this included starting continuous O2 via [place] 2L, doxycycline and prednisone for the respiratory symptoms.  She was started on Cardizem CD 120 mg daily and advised to continue low-dose Eliquis for stroke prevention for atrial fibrillation.  Patient is to follow-up with outpatient cardiology at which time prolonged cardiac monitor will be ordered.   Patient was advised to follow up with PCP in 1 week to repeat labs (CBC & BMP).  Also to follow up with pulmonology. Has appt with Dr. Patsey Berthold on 07/11/20.  Telephone follow up was not done. She reports good compliance with treatment. She reports this condition is improved.  She does continue to have issues with memory, most likely from a prolonged hypoxia as it is unknown how long she was truly having this issue, but it has most likely been ongoing for months. Patient has had a couple of visits with our office, but they were limited in exam as they were all done virtually over the telephone due to the Covid-19 pandemic. She has a good friend and neighbor named [name] that is accompanying her today and is taking on the roll as a caregiver to help her and assist her in any manner.    Medications were reviewed and updated for the patient today.  -----------------------------------------------------------------------------------------    Patient Active Problem List   Diagnosis Date Noted  . Atrial fibrillation with rapid ventricular response ([place]) 06/13/2020  . Acute on suspect chronic respiratory failure with hypoxia ([place]) 06/13/2020  . Right hand pain 06/13/2020  . Fall at home, initial encounter 06/13/2020  . Rapid atrial fibrillation ([place]) 06/13/2020  . New onset atrial fibrillation ([place]) 06/13/2020  . COPD with acute exacerbation ([place]) 06/13/2020  . Bronchiectasis without complication ([place]) 23/55/7322  . Lymphedema 05/17/2019  . Chronic venous insufficiency 05/17/2019  . Swelling of limb 02/10/2018  . Varicose veins of leg with swelling, bilateral 02/10/2018  . Squamous cell carcinoma 09/22/2017  . Hypoxia 09/27/2015  . Abnormal CXR 09/27/2015  . Asthma with allergic rhinitis 08/31/2015  . Back ache 08/31/2015  . Chronic interstitial cystitis 08/31/2015  . Grief reaction 08/31/2015  . Hyperlipidemia 06/26/2015  . Migraine 06/26/2015  . Asthma 06/26/2015  . IBS (irritable bowel syndrome) 06/26/2015  . Osteoporosis 06/26/2015  . Fatigue 06/26/2015  . Lumbar radiculopathy 04/27/2013  . Urinary urgency 01/08/2012  . Frequency of urination 01/08/2012   Past Medical History:  Diagnosis Date  . Allergy   . Asthma   . Hyperlipidemia   . Osteoporosis        Medications: Outpatient Medications Prior to Visit  Medication Sig  . apixaban (ELIQUIS) 2.5 MG TABS tablet Take 1 tablet (2.5 mg total) by mouth 2 (two) times daily.  Marland Kitchen diltiazem (CARDIZEM CD) 120 MG 24 hr capsule Take  1 capsule (120 mg total) by mouth daily.  Marland Kitchen doxepin (SINEQUAN) 10 MG capsule 1 Capsule(s) By Mouth 4-5 Times Daily PRN  . feeding supplement, ENSURE ENLIVE, (ENSURE ENLIVE) LIQD Take 237 mLs by mouth 2 (two) times daily between meals.  . fluticasone (FLONASE) 50 MCG/ACT nasal  spray Place into both nostrils.  . Fluticasone-Umeclidin-Vilant (TRELEGY ELLIPTA) 100-62.5-25 MCG/INH AEPB Inhale 1 puff into the lungs daily.  Marland Kitchen oxybutynin (DITROPAN XL) 15 MG 24 hr tablet Take 15 mg by mouth at bedtime.   . predniSONE (DELTASONE) 10 MG tablet Take 3 tabs (30 mg total) daily for 3 days, then 2 tabs (20 mg total) daily for 3 days, then 1 tab (10 mg total) daily for 3 days, then stop.  Marland Kitchen ULTRAM 50 MG tablet Take 1 tablet (50 mg total) by mouth every 6 (six) hours as needed.  . VENTOLIN HFA 108 (90 Base) MCG/ACT inhaler INHALE TWO PUFFS INTO THE LUNGS EVERY 4 HOURS AS NEEDED FOR WHEEZING OR SHORTNESS OF BREATH  . [DISCONTINUED] Fluticasone-Salmeterol (ADVAIR DISKUS) 250-50 MCG/DOSE AEPB Inhale 1 puff into the lungs daily.  (Patient not taking: Reported on 06/22/2020)  . [DISCONTINUED] Tiotropium Bromide Monohydrate (SPIRIVA RESPIMAT) 1.25 MCG/ACT AERS Inhale 2 puffs into the lungs daily.  (Patient not taking: Reported on 06/22/2020)   No facility-administered medications prior to visit.    Review of Systems  Constitutional: Negative for appetite change, chills, fatigue and fever.  HENT: Negative.   Respiratory: Positive for shortness of breath (unchanged from baseline). Negative for cough, chest tightness and wheezing.   Cardiovascular: Negative for chest pain, palpitations and leg swelling.  Gastrointestinal: Negative for abdominal pain, nausea and vomiting.  Musculoskeletal: Positive for back pain (chronic).  Neurological: Negative for dizziness, weakness, light-headedness and headaches.    Last CBC Lab Results  Component Value Date   WBC 9.1 06/15/2020   HGB 11.1 (L) 06/15/2020   HCT 36.2 06/15/2020   MCV 81.3 06/15/2020   MCH 24.9 (L) 06/15/2020   RDW 15.1 06/15/2020   PLT 368 41/93/7902   Last metabolic panel Lab Results  Component Value Date   GLUCOSE 104 (H) 06/15/2020   NA 141 06/15/2020   K 4.3 06/15/2020   CL 103 06/15/2020   CO2 30 06/15/2020   BUN 32  (H) 06/15/2020   CREATININE 0.95 06/15/2020   GFRNONAA 56 (L) 06/15/2020   GFRAA >60 06/15/2020   CALCIUM 8.5 (L) 06/15/2020   PROT 6.7 05/21/2018   ALBUMIN 4.6 05/21/2018   LABGLOB 2.1 05/21/2018   AGRATIO 2.2 05/21/2018   BILITOT 0.3 05/21/2018   ALKPHOS 74 05/21/2018   AST 14 05/21/2018   ALT 18 05/21/2018   ANIONGAP 8 06/15/2020   Last lipids Lab Results  Component Value Date   CHOL 216 (H) 05/21/2018   HDL 78 05/21/2018   LDLCALC 124 (H) 05/21/2018   TRIG 70 05/21/2018   CHOLHDL 3.0 06/03/2016   Last hemoglobin A1c No results found for: HGBA1C    Objective    BP 134/85   Pulse 93   Temp 98.9 F (37.2 C) (Oral)   Resp 20   Wt 113 lb (51.3 kg)   SpO2 99% Comment: room air  BMI 23.62 kg/m  BP Readings from Last 3 Encounters:  06/22/20 134/85  06/15/20 133/82  06/12/20 105/81   Wt Readings from Last 3 Encounters:  06/22/20 113 lb (51.3 kg)  06/15/20 112 lb 8 oz (51 kg)  06/12/20 115 lb (52.2 kg)   SpO2 Readings  from Last 3 Encounters:  06/22/20 99%  06/15/20 92%  06/12/20 95%     Physical Exam Vitals reviewed.  Constitutional:      General: She is not in acute distress.    Appearance: Normal appearance. She is well-developed and normal weight. She is not ill-appearing or diaphoretic.  HENT:     Head: Normocephalic and atraumatic.  Eyes:     General: No scleral icterus. Neck:     Vascular: No JVD.  Cardiovascular:     Rate and Rhythm: Normal rate. Rhythm irregularly irregular.     Heart sounds: Normal heart sounds. No murmur heard.  No friction rub. No gallop.   Pulmonary:     Effort: Pulmonary effort is normal. No respiratory distress.     Breath sounds: Normal breath sounds. No wheezing or rales.  Musculoskeletal:     Cervical back: Normal range of motion and neck supple.     Right lower leg: No edema.     Left lower leg: No edema.  Skin:    General: Skin is warm and dry.  Neurological:     Mental Status: She is alert.       No  results found for any visits on 06/22/20.  Assessment & Plan     Problem List Items Addressed This Visit      Cardiovascular and Mediastinum   Atrial fibrillation with rapid ventricular response ([place]) - Primary    On exam today rhythm was irregular but rate controlled.  Continue Cardizem CD 120mg  daily and Eliquis 2.5mg  BID. Referral placed to Dr. Saralyn Pilar for appt for outpatient follow up.      Relevant Medications   apixaban (ELIQUIS) 2.5 MG TABS tablet   diltiazem (CARDIZEM CD) 120 MG 24 hr capsule   Other Relevant Orders   CBC with Differential/Platelet   Basic Metabolic Panel (BMET)   Ambulatory referral to Cardiology     Respiratory   Asthma    Currently doing well. Continue Trelegy one puff daily. Ventolin is to be used for rescue inhaler and can be used q 4-6 hrs prn for SOB and wheezing.  Has appt with Dr. Patsey Berthold, Pulmonology, on 07/11/20.      Relevant Medications   Fluticasone-Umeclidin-Vilant (TRELEGY ELLIPTA) 100-62.5-25 MCG/INH AEPB   VENTOLIN HFA 108 (90 Base) MCG/ACT inhaler   Bronchiectasis without complication (HCC)    Currently doing well. Continue Trelegy one puff daily. Ventolin is to be used for rescue inhaler and can be used q 4-6 hrs prn for SOB and wheezing.  Has appt with Dr. Patsey Berthold, Pulmonology, on 07/11/20.      Relevant Medications   Fluticasone-Umeclidin-Vilant (TRELEGY ELLIPTA) 100-62.5-25 MCG/INH AEPB   VENTOLIN HFA 108 (90 Base) MCG/ACT inhaler   Acute on suspect chronic respiratory failure with hypoxia (HCC)    Doing well today. O2 sat is 99% on 2L O2 [place].       Relevant Medications   Fluticasone-Umeclidin-Vilant (TRELEGY ELLIPTA) 100-62.5-25 MCG/INH AEPB   VENTOLIN HFA 108 (90 Base) MCG/ACT inhaler     Digestive   IBS (irritable bowel syndrome)    Stable. Continue Bentyl 10mg  on prn basis.      Relevant Medications   dicyclomine (BENTYL) 10 MG capsule   docusate sodium (STOOL SOFTENER) 100 MG capsule     Genitourinary    Chronic interstitial cystitis    Stable for years on Elmiron 100mg  BID and Oxybutynin ER 15mg  daily.  Was previously followed by Dr. Amalia Hailey, Urology, in [place], but requesting to just  have medications refilled by [place] as she has been stable for so long. Medication refills sent in.      Relevant Medications   pentosan polysulfate (ELMIRON) 100 MG capsule   oxybutynin (DITROPAN XL) 15 MG 24 hr tablet     Other   Depression, major, single episode, in partial remission (HCC)    Stable on Doxepin 10mg , takes 30mg  daily.      Relevant Medications   doxepin (SINEQUAN) 10 MG capsule      No follow-ups on file.      Reynolds Bowl, PA-C, have reviewed all documentation for this visit. The documentation on 06/22/20 for the exam, diagnosis, procedures, and orders are all accurate and complete.   Rubye Beach  [facility] [phone] (phone) [phone] (fax)  [place]

## 2020-06-22 NOTE — Patient Instructions (Signed)
Acute Respiratory Failure, Adult  Acute respiratory failure occurs when there is not enough oxygen passing from your lungs to your body. When this happens, your lungs have trouble removing carbon dioxide from the blood. This causes your blood oxygen level to drop too low as carbon dioxide builds up. Acute respiratory failure is a medical emergency. It can develop quickly, but it is temporary if treated promptly. Your lung capacity, or how much air your lungs can hold, may improve with time, exercise, and treatment. What are the causes? There are many possible causes of acute respiratory failure, including:  Lung injury.  Chest injury or damage to the ribs or tissues near the lungs.  Lung conditions that affect the flow of air and blood into and out of the lungs, such as pneumonia, acute respiratory distress syndrome, and cystic fibrosis.  Medical conditions, such as strokes or spinal cord injuries, that affect the muscles and nerves that control breathing.  Blood infection (sepsis).  Inflammation of the pancreas (pancreatitis).  A blood clot in the lungs (pulmonary embolism).  A large-volume blood transfusion.  Burns.  Near-drowning.  Seizure.  Smoke inhalation.  Reaction to medicines.  Alcohol or drug overdose. What increases the risk? This condition is more likely to develop in people who have:  A blocked airway.  Asthma.  A condition or disease that damages or weakens the muscles, nerves, bones, or tissues that are involved in breathing.  A serious infection.  A health problem that blocks the unconscious reflex that is involved in breathing, such as hypothyroidism or sleep apnea.  A lung injury or trauma. What are the signs or symptoms? Trouble breathing is the main symptom of acute respiratory failure. Symptoms may also include:  Rapid breathing.  Restlessness or anxiety.  Skin, lips, or fingernails that appear blue (cyanosis).  Rapid heart  rate.  Abnormal heart rhythms (arrhythmias).  Confusion or changes in behavior.  Tiredness or loss of energy.  Feeling sleepy or having a loss of consciousness. How is this diagnosed? Your health care provider can diagnose acute respiratory failure with a medical history and physical exam. During the exam, your health care provider will listen to your heart and check for crackling or wheezing sounds in your lungs. Your may also have tests to confirm the diagnosis and determine what is causing respiratory failure. These tests may include:  Measuring the amount of oxygen in your blood (pulse oximetry). The measurement comes from a small device that is placed on your finger, earlobe, or toe.  Other blood tests to measure blood gases and to look for signs of infection.  Sampling your cerebral spinal fluid or tracheal fluid to check for infections.  Chest X-ray to look for fluid in spaces that should be filled with air.  Electrocardiogram (ECG) to look at the heart's electrical activity. How is this treated? Treatment for this condition usually takes places in a hospital intensive care unit (ICU). Treatment depends on what is causing the condition. It may include one or more treatments until your symptoms improve. Treatment may include:  Supplemental oxygen. Extra oxygen is given through a tube in the nose, a face mask, or a hood.  A device such as a continuous positive airway pressure (CPAP) or bi-level positive airway pressure (BiPAP or BPAP) machine. This treatment uses mild air pressure to keep the airways open. A mask or other device will be placed over your nose or mouth. A tube that is connected to a motor will deliver oxygen through the   mask.  Ventilator. This treatment helps move air into and out of the lungs. This may be done with a bag and mask or a machine. For this treatment, a tube is placed in your windpipe (trachea) so air and oxygen can flow to the lungs.  Extracorporeal  membrane oxygenation (ECMO). This treatment temporarily takes over the function of the heart and lungs, supplying oxygen and removing carbon dioxide. ECMO gives the lungs a chance to recover. It may be used if a ventilator is not effective.  Tracheostomy. This is a procedure that creates a hole in the neck to insert a breathing tube.  Receiving fluids and medicines.  Rocking the bed to help breathing. Follow these instructions at home:  Take over-the-counter and prescription medicines only as told by your health care provider.  Return to normal activities as told by your health care provider. Ask your health care provider what activities are safe for you.  Keep all follow-up visits as told by your health care provider. This is important. How is this prevented? Treating infections and medical conditions that may lead to acute respiratory failure can help prevent the condition from developing. Contact a health care provider if:  You have a fever.  Your symptoms do not improve or they get worse. Get help right away if:  You are having trouble breathing.  You lose consciousness.  Your have cyanosis or turn blue.  You develop a rapid heart rate.  You are confused. These symptoms may represent a serious problem that is an emergency. Do not wait to see if the symptoms will go away. Get medical help right away. Call your local emergency services (911 in the [place] Do not drive yourself to the hospital. This information is not intended to replace advice given to you by your health care provider. Make sure you discuss any questions you have with your health care provider. Document Revised: 11/14/2017 Document Reviewed: 06/19/2016 Elsevier Patient Education  2020 Elsevier Inc.  

## 2020-06-22 NOTE — Assessment & Plan Note (Signed)
Stable on Doxepin 10mg , takes 30mg  daily.

## 2020-06-22 NOTE — Assessment & Plan Note (Signed)
Stable. Continue Bentyl 10mg  on prn basis.

## 2020-06-22 NOTE — Assessment & Plan Note (Signed)
Stable for years on Elmiron 100mg  BID and Oxybutynin ER 15mg  daily.  Was previously followed by Dr. Amalia Hailey, Urology, in [place], but requesting to just have medications refilled by [place] as she has been stable for so long. Medication refills sent in.

## 2020-06-22 NOTE — Telephone Encounter (Signed)
Requested medication (s) are due for refill today:unsure  Requested medication (s) are on the active medication list: No  Last refill:  06/17/19  Future visit scheduled:yes  Notes to clinic:  Med off protocol  Not on current medication list .    Requested Prescriptions  Pending Prescriptions Disp Refills   nystatin (MYCOSTATIN) 100000 UNIT/ML suspension [Pharmacy Med Name: NYSTATIN 100000 UNIT/ML MOUTH SUSP] 60 mL     Sig: TAKE 5 MLS BY MOUTH 4 TIMES A DAY.      Off-Protocol Failed - 06/22/2020 12:58 PM      Failed - Medication not assigned to a protocol, review manually.      Passed - Valid encounter within last 12 months    Recent Outpatient Visits           Today Atrial fibrillation with rapid ventricular response [facility])   [place], [place]   3 weeks ago Age-related osteoporosis with current pathological fracture with routine healing, subsequent encounter   [place], [place]   8 months ago Bronchiectasis without complication [facility])   [facility] [place], Clearnce Sorrel, [place]   1 year ago Ear popping, left   [facility], [place], [place]   1 year ago Nausea   [place], Wendee Beavers, [place]       Future Appointments             In 1 month [place], Clearnce Sorrel, PA-C [employer], [place]

## 2020-06-22 NOTE — Assessment & Plan Note (Signed)
Doing well today. O2 sat is 99% on 2L O2 [place].

## 2020-06-22 NOTE — Assessment & Plan Note (Signed)
On exam today rhythm was irregular but rate controlled.  Continue Cardizem CD 120mg  daily and Eliquis 2.5mg  BID. Referral placed to Dr. Saralyn Pilar for appt for outpatient follow up.

## 2020-06-23 ENCOUNTER — Telehealth: Payer: Self-pay

## 2020-06-23 LAB — CBC WITH DIFFERENTIAL/PLATELET
Basophils Absolute: 0 10*3/uL (ref 0.0–0.2)
Basos: 0 %
EOS (ABSOLUTE): 0.2 10*3/uL (ref 0.0–0.4)
Eos: 2 %
Hematocrit: 35.8 % (ref 34.0–46.6)
Hemoglobin: 11.4 g/dL (ref 11.1–15.9)
Immature Grans (Abs): 0.1 10*3/uL (ref 0.0–0.1)
Immature Granulocytes: 1 %
Lymphocytes Absolute: 1.6 10*3/uL (ref 0.7–3.1)
Lymphs: 17 %
MCH: 25.4 pg — ABNORMAL LOW (ref 26.6–33.0)
MCHC: 31.8 g/dL (ref 31.5–35.7)
MCV: 80 fL (ref 79–97)
Monocytes Absolute: 1.1 10*3/uL — ABNORMAL HIGH (ref 0.1–0.9)
Monocytes: 11 %
Neutrophils Absolute: 6.5 10*3/uL (ref 1.4–7.0)
Neutrophils: 69 %
Platelets: 334 10*3/uL (ref 150–450)
RBC: 4.49 x10E6/uL (ref 3.77–5.28)
RDW: 14.4 % (ref 11.7–15.4)
WBC: 9.4 10*3/uL (ref 3.4–10.8)

## 2020-06-23 LAB — BASIC METABOLIC PANEL
BUN/Creatinine Ratio: 24 (ref 12–28)
BUN: 21 mg/dL (ref 8–27)
CO2: 31 mmol/L — ABNORMAL HIGH (ref 20–29)
Calcium: 10 mg/dL (ref 8.7–10.3)
Chloride: 96 mmol/L (ref 96–106)
Creatinine, Ser: 0.88 mg/dL (ref 0.57–1.00)
GFR calc Af Amer: 71 mL/min/{1.73_m2} (ref >59)
GFR calc non Af Amer: 61 mL/min/{1.73_m2} (ref >59)
Glucose: 90 mg/dL (ref 65–99)
Potassium: 4.6 mmol/L (ref 3.5–5.2)
Sodium: 141 mmol/L (ref 134–144)

## 2020-06-23 NOTE — Telephone Encounter (Signed)
Patient advised as directed below. 

## 2020-06-23 NOTE — Telephone Encounter (Signed)
-----   Message from Mar Daring, PA-C sent at 06/23/2020  1:16 PM EDT ----- Blood count is doing well. Hemoglobin is improving. Sugar is normal. Kidney function is normal. Sodium, potassium, and calcium are normal.

## 2020-06-27 ENCOUNTER — Telehealth: Payer: Self-pay

## 2020-06-27 NOTE — Telephone Encounter (Signed)
Copied from [place] [id number]. Topic: General - Inquiry >> Jun 27, 2020  4:44 PM Doyce Loose D wrote: Reason for CRM: Pt called w/ questions of new diagnoses, PT wants to know if she would be able to check her mailbox which is 4 car lengths away from her back door and wanted to know if she would be able to walk with oxygen off. She also wanted to know if she should hold off on her dental appts. Until further notice.

## 2020-06-28 NOTE — Telephone Encounter (Signed)
If she feels safe and comfortable she can walk to her mailbox. I would not recommend to walk without oxygen until she is seen by Pulmonology. They may do a walk test to see how she does without oxygen with walking. If they do not, we will do it when she comes back to see me in early August.

## 2020-06-29 NOTE — Telephone Encounter (Signed)
Patient advised as directed below. 

## 2020-07-11 ENCOUNTER — Institutional Professional Consult (permissible substitution): Payer: Medicare HMO | Admitting: Pulmonary Disease

## 2020-07-13 ENCOUNTER — Telehealth: Payer: Self-pay

## 2020-07-13 NOTE — Telephone Encounter (Signed)
Copied from [place] [id number]. Topic: General - Other >> Jul 13, 2020  2:25 PM Hinda Lenis D wrote: PT past out last week/ when to the walk in clinic and would like a call back from the medical team, she decline an appt, her cell is [phone]

## 2020-07-13 NOTE — Telephone Encounter (Signed)
No answer-try to reach patient to check on status but phone line just rings.

## 2020-07-14 NOTE — Telephone Encounter (Signed)
Patient calling back and reports that the "girl" that answered her didn't have no idea what she wanted. Just wants the provider to know.   She has some questions, she wants to know what does it means 1.5 a day fluid restriction...what does she need to be looking at or to do?  Is it ok for her to take a shower with out her oxygen? She has been doing this but yesterday for the first time she got winded.  She is needing information on low salt diet and a-fib diet on greens. She does not have a computer.  She also reports that she was taking verapamil for her headache and that at the hospital they told her that she was already taking another medicine that had the same medicine in it and that she doesn't need to verapamil by itself. So she wants to know if she needs to be on it or if she is taking something that helps her headache and that is similar to the verapamil.

## 2020-07-14 NOTE — Telephone Encounter (Signed)
So first for the 1.5 fluid restriction, that means to drink less than 1 and a half liters of fluid per day, this would equal to 1536mL or less than 51 ounces of fluid per day.  As for showering without her oxygen, if her showers are rather quick she should be ok. She will probably have to take more luke warm showers over hot showers as the steam may wind her more as well. This is more a comfort level for her than anything.   For low salt diets, just limit adding salt to foods. Also have to be careful with processed meats such as deli meat, bacon, sausage, etc. Also canned soups and canned vegetables have a lot of sodium in them as well. Frozen vegetables are better.   As for the greens diet with atrial fibrillation, she does not have to worry about this or limit. This is more for people that are on warfarin for the blood thinner with atrial fibrillation. She is on Eliquis so she does not need to limit greens.   The hospital is correct. She is now on Diltiazem (Cardizem) 120mg . This replaced the verapamil. She should stop the verapamil.

## 2020-07-17 ENCOUNTER — Telehealth: Payer: Self-pay | Admitting: Physician Assistant

## 2020-07-17 NOTE — Telephone Encounter (Signed)
RX REFILL traMADol (ULTRAM) 50 MG tablet [place], [place] Phone:  [phone]  Fax:  [phone]     Patient states she would like PCP would like to refill medication.  Patient did get this in hospital and would like another refill.

## 2020-07-17 NOTE — Telephone Encounter (Signed)
Patient advised as directed below. 

## 2020-07-19 ENCOUNTER — Telehealth: Payer: Self-pay

## 2020-07-19 NOTE — Telephone Encounter (Signed)
I called and spoke with patient's grand daughter [name] and advised her as below. She verbalized understanding. She will reinforce to patient that she needs to wear her oxygen. Valerie plans to check on her grandmother tonight to make sure she is doing ok.

## 2020-07-19 NOTE — Telephone Encounter (Signed)
Pt called in and stated she can not sit still today , she stated there was nothing different with her oxygen today.  She stated that when she pick up her tramadol pill it looked different and she did take that today? She stated she feels like she is almost nerves to be at home and just can not sit still.  She would like a call back  Best number [phone]

## 2020-07-19 NOTE — Telephone Encounter (Signed)
Copied from [place] [id number]. Topic: General - Call Back - No Documentation >> Jul 19, 2020 10:27 AM Jaynie Collins D wrote: Reason for CRM:  Patient's grand- daughter would like to have someone contact her back regarding her grandmother's health, states she lives alone and she isn't using her oxygen like she is suppose to. [place]- Daughter would like to know options from Dr.

## 2020-07-19 NOTE — Telephone Encounter (Signed)
I am out of the office today. Could someone call her back to see if there is something (medical supply) that would help her wear it more. If not, the best thing would be for everyone around her to continue to reinforce the need for the oxygen. I know Josie talked to [facility] Monday night and talked to her about making sure she was wearing the oxygen and went over one of her medications.

## 2020-07-20 NOTE — Progress Notes (Signed)
Virtual telephone visit    Virtual Visit via Telephone Note   This visit type was conducted due to national recommendations for restrictions regarding the COVID-19 Pandemic (e.g. social distancing) in an effort to limit this patient's exposure and mitigate transmission in our community. Due to her co-morbid illnesses, this patient is at least at moderate risk for complications without adequate follow up. This format is felt to be most appropriate for this patient at this time. The patient did not have access to video technology or had technical difficulties with video requiring transitioning to audio format only (telephone). Physical exam was limited to content and character of the telephone converstion.    I connected with Jackie Turner on 07/21/20 at 11:00 AM EDT by a telephone and verified that I am speaking with the correct person using two identifiers.  I discussed the limitations of evaluation and management by telemedicine and the availability of in person appointments. The patient expressed understanding and agreed to proceed.   Patient location: Home Provider location: [facility]   Visit Date: 07/21/2020  Today's healthcare provider: Mar Daring, PA-C   Chief Complaint  Patient presents with  . Follow-up   Subjective    HPI  Patient with c/o still having panic attacks and not feeling well. Reports that she has been using her oxygen 24/7. She has it on 2 L (not sure).  [employer] in [place] is the company that supplied the oxygen.  Panic attacks have been ongoing prior to hospitalization. Was felt to have been when oxygen drops, but still having symptoms even with oxygen. They happen most often while she is taking a shower, but have never made her stop showering, and then also when she is trying to fall asleep. Feels the need to "get away". In the shower, she has urge to just get out but doesn't. And same when she is falling asleep, has the need to sit up and  move. She denies any changes in her weight. She is able to take a nap without issues.  Does have recurrent leg swelling in the right leg. Has previously seen the vein clinic, last seen 05/2019. Using the lymphedema pump and that helps some. Limiting fluids to 2.5 [place] water bottles daily. Working on reducing sodium and reading labels.   Has questions about her oxygen. Is wearing 2L but reports "runs out overnight". She does not wear in the shower due to issues getting in shower with tubing. Reports has a machine that is plugged in her hallway. Has tubing long enough to go throughout her house. Unsure of how to refill or use. Has had people try to show her, but they end up doing it for her and not teaching her.     Patient Active Problem List   Diagnosis Date Noted  . Depression, major, single episode, in partial remission ([place]) 06/22/2020  . Atrial fibrillation with rapid ventricular response ([place]) 06/13/2020  . Acute on suspect chronic respiratory failure with hypoxia ([place]) 06/13/2020  . Right hand pain 06/13/2020  . Fall at home, initial encounter 06/13/2020  . COPD with acute exacerbation ([place]) 06/13/2020  . Bronchiectasis without complication ([place]) 90/30/0923  . Lymphedema 05/17/2019  . Chronic venous insufficiency 05/17/2019  . Swelling of limb 02/10/2018  . Varicose veins of leg with swelling, bilateral 02/10/2018  . Squamous cell carcinoma 09/22/2017  . Hypoxia 09/27/2015  . Abnormal CXR 09/27/2015  . Asthma with allergic rhinitis 08/31/2015  . Back ache 08/31/2015  .  Chronic interstitial cystitis 08/31/2015  . Grief reaction 08/31/2015  . Hyperlipidemia 06/26/2015  . Migraine 06/26/2015  . Asthma 06/26/2015  . IBS (irritable bowel syndrome) 06/26/2015  . Osteoporosis 06/26/2015  . Fatigue 06/26/2015  . Lumbar radiculopathy 04/27/2013  . Urinary urgency 01/08/2012  . Frequency of urination 01/08/2012   Past Medical History:  Diagnosis Date  . Allergy   . Asthma   .  Hyperlipidemia   . Osteoporosis       Medications: Outpatient Medications Prior to Visit  Medication Sig  . apixaban (ELIQUIS) 2.5 MG TABS tablet Take 1 tablet (2.5 mg total) by mouth 2 (two) times daily.  . calcium carbonate (CALTRATE 600) 1500 (600 Ca) MG TABS tablet Take by mouth 2 (two) times daily with a meal.  . dicyclomine (BENTYL) 10 MG capsule Take 1 capsule (10 mg total) by mouth 3 (three) times daily as needed for spasms.  Marland Kitchen diltiazem (CARDIZEM CD) 120 MG 24 hr capsule Take 1 capsule (120 mg total) by mouth daily.  Marland Kitchen doxepin (SINEQUAN) 10 MG capsule Take 3 capsules (30 mg total) by mouth at bedtime.  . feeding supplement, ENSURE ENLIVE, (ENSURE ENLIVE) LIQD Take 237 mLs by mouth 2 (two) times daily between meals.  . fluticasone (FLONASE) 50 MCG/ACT nasal spray Place into both nostrils.  . Fluticasone-Umeclidin-Vilant (TRELEGY ELLIPTA) 100-62.5-25 MCG/INH AEPB Inhale 1 puff into the lungs daily.  Marland Kitchen nystatin (MYCOSTATIN) 100000 UNIT/ML suspension TAKE 5 MLS BY MOUTH 4 TIMES A DAY.  Marland Kitchen oxybutynin (DITROPAN XL) 15 MG 24 hr tablet Take 1 tablet (15 mg total) by mouth at bedtime.  . pentosan polysulfate (ELMIRON) 100 MG capsule Take 1 capsule (100 mg total) by mouth in the morning and at bedtime.  Marland Kitchen ULTRAM 50 MG tablet Take 1 tablet (50 mg total) by mouth every 6 (six) hours as needed.  . Urea 40 % LOTN Apply topically.  . VENTOLIN HFA 108 (90 Base) MCG/ACT inhaler INHALE TWO PUFFS INTO THE LUNGS EVERY 4 HOURS AS NEEDED FOR WHEEZING OR SHORTNESS OF BREATH  . Clobetasol Propionate Emulsion 0.05 % topical foam Apply topically 2 (two) times daily.  Marland Kitchen docusate sodium (STOOL SOFTENER) 100 MG capsule Take 1 capsule (100 mg total) by mouth 2 (two) times daily. (Patient not taking: Reported on 07/21/2020)  . predniSONE (DELTASONE) 10 MG tablet Take 3 tabs (30 mg total) daily for 3 days, then 2 tabs (20 mg total) daily for 3 days, then 1 tab (10 mg total) daily for 3 days, then stop.   No  facility-administered medications prior to visit.    Review of Systems  Last CBC Lab Results  Component Value Date   WBC 9.4 06/22/2020   HGB 11.4 06/22/2020   HCT 35.8 06/22/2020   MCV 80 06/22/2020   MCH 25.4 (L) 06/22/2020   RDW 14.4 06/22/2020   PLT 334 54/56/2563   Last metabolic panel Lab Results  Component Value Date   GLUCOSE 90 06/22/2020   NA 141 06/22/2020   K 4.6 06/22/2020   CL 96 06/22/2020   CO2 31 (H) 06/22/2020   BUN 21 06/22/2020   CREATININE 0.88 06/22/2020   GFRNONAA 61 06/22/2020   GFRAA 71 06/22/2020   CALCIUM 10.0 06/22/2020   PROT 6.7 05/21/2018   ALBUMIN 4.6 05/21/2018   LABGLOB 2.1 05/21/2018   AGRATIO 2.2 05/21/2018   BILITOT 0.3 05/21/2018   ALKPHOS 74 05/21/2018   AST 14 05/21/2018   ALT 18 05/21/2018   ANIONGAP 8 06/15/2020  Objective    There were no vitals taken for this visit. BP Readings from Last 3 Encounters:  06/22/20 134/85  06/15/20 133/82  06/12/20 105/81   Wt Readings from Last 3 Encounters:  06/22/20 113 lb (51.3 kg)  06/15/20 112 lb 8 oz (51 kg)  06/12/20 115 lb (52.2 kg)        Assessment & Plan     1. Panic attacks Occur in the shower and at night before she falls asleep. Could be more related with oxygen dropping, as she is not wearing oxygen in the shower. She does wear to sleep but not sure if it is connected correctly. Will try low dose trazodone as below to see if this helps her sleep and lessens anxiety over time. Has f/u with me already scheduled for 08/03/20. - traZODone (DESYREL) 50 MG tablet; Take 1 tablet (50 mg total) by mouth at bedtime.  Dispense: 30 tablet; Refill: 1  2. Difficulty sleeping See above medical treatment plan. - traZODone (DESYREL) 50 MG tablet; Take 1 tablet (50 mg total) by mouth at bedtime.  Dispense: 30 tablet; Refill: 1  3. Acute on suspect chronic respiratory failure with hypoxia ([place]) Will place referral to [facility] for education and training on oxygen and making sure  patient understands importance of use and make sure she is using correctly. The hypoxia requiring oxygen and atrial fibrillation are both new diagnoses and patient has some decline in memory so want to make sure all is being answered, she is using correctly and make sure nothing is needed in the home for her. [place] assistance.  - Ambulatory referral to [place]  4. Bronchiectasis without complication ([place]) Followed by Pulmonology, Dr. Patsey Berthold. See above medical treatment plan. - Ambulatory referral to Home Health  5. Atrial fibrillation with rapid ventricular response ([facility]) Followed by Dr. Saralyn Pilar, cardiology. Continue Eliquis 2.5mg  BID, Diltiazem 120mg  daily. See above medical treatment plan. - Ambulatory referral to Home Health   No follow-ups on file.    I discussed the assessment and treatment plan with the patient. The patient was provided an opportunity to ask questions and all were answered. The patient agreed with the plan and demonstrated an understanding of the instructions.   The patient was advised to call back or seek an in-person evaluation if the symptoms worsen or if the condition fails to improve as anticipated.  I provided 49 minutes of non-face-to-face time during this encounter.  Reynolds Bowl, PA-C, have reviewed all documentation for this visit. The documentation on 07/21/20 for the exam, diagnosis, procedures, and orders are all accurate and complete.  [place] [facility] [phone] (phone) [phone] (fax)  [place]

## 2020-07-21 ENCOUNTER — Encounter: Payer: Self-pay | Admitting: Physician Assistant

## 2020-07-21 ENCOUNTER — Ambulatory Visit (INDEPENDENT_AMBULATORY_CARE_PROVIDER_SITE_OTHER): Payer: Medicare HMO | Admitting: Physician Assistant

## 2020-07-21 DIAGNOSIS — G479 Sleep disorder, unspecified: Secondary | ICD-10-CM

## 2020-07-21 DIAGNOSIS — J9621 Acute and chronic respiratory failure with hypoxia: Secondary | ICD-10-CM

## 2020-07-21 DIAGNOSIS — J479 Bronchiectasis, uncomplicated: Secondary | ICD-10-CM

## 2020-07-21 DIAGNOSIS — F410 Panic disorder [episodic paroxysmal anxiety] without agoraphobia: Secondary | ICD-10-CM | POA: Diagnosis not present

## 2020-07-21 DIAGNOSIS — I4891 Unspecified atrial fibrillation: Secondary | ICD-10-CM

## 2020-07-21 MED ORDER — TRAZODONE HCL 50 MG PO TABS: 50.0000 mg | ORAL_TABLET | Freq: Every day | ORAL | 1 refills | Status: DC

## 2020-07-24 ENCOUNTER — Ambulatory Visit: Payer: Self-pay

## 2020-07-24 ENCOUNTER — Telehealth: Payer: Self-pay | Admitting: Physician Assistant

## 2020-07-24 DIAGNOSIS — I4891 Unspecified atrial fibrillation: Secondary | ICD-10-CM

## 2020-07-24 NOTE — Telephone Encounter (Signed)
Pt. Concerned her medication could have caused her to have diarrhea this morning. States her Cardizem has come from a different company "and maybe it has caused diarrhea." Has 3 loose to watery stools this morning. No fever or blood in stool. No abdominal pain.Has decreased appetite. Also feels like she is having more shortness of breath.[place] checked her O2 over the weekend. States when she first came home from the hospital, she could do light  House work, "but now I get winded pretty easy." Please advise pt. Answer Assessment - Initial Assessment Questions 1. DIARRHEA SEVERITY: "How bad is the diarrhea?" "How many extra stools have you had in the past 24 hours than normal?"    - NO DIARRHEA (SCALE 0)   - MILD (SCALE 1-3): Few loose or mushy BMs; increase of 1-3 stools over normal daily number of stools; mild increase in ostomy output.   -  MODERATE (SCALE 4-7): Increase of 4-6 stools daily over normal; moderate increase in ostomy output. * SEVERE (SCALE 8-10; OR 'WORST POSSIBLE'): Increase of 7 or more stools daily over normal; moderate increase in ostomy output; incontinence.     3 2. ONSET: "When did the diarrhea begin?"      Today 3. BM CONSISTENCY: "How loose or watery is the diarrhea?"      Water 4. VOMITING: "Are you also vomiting?" If Yes, ask: "How many times in the past 24 hours?"      No 5. ABDOMINAL PAIN: "Are you having any abdominal pain?" If Yes, ask: "What does it feel like?" (e.g., crampy, dull, intermittent, constant)      No 6. ABDOMINAL PAIN SEVERITY: If present, ask: "How bad is the pain?"  (e.g., Scale 1-10; mild, moderate, or severe)   - MILD (1-3): doesn't interfere with normal activities, abdomen soft and not tender to touch    - MODERATE (4-7): interferes with normal activities or awakens from sleep, tender to touch    - SEVERE (8-10): excruciating pain, doubled over, unable to do any normal activities       No pain 7. ORAL INTAKE: If vomiting,  "Have you been able to drink liquids?" "How much fluids have you had in the past 24 hours?"     Yes 8. HYDRATION: "Any signs of dehydration?" (e.g., dry mouth [not just dry lips], too weak to stand, dizziness, new weight loss) "When did you last urinate?"     No 9. EXPOSURE: "Have you traveled to a foreign country recently?" "Have you been exposed to anyone with diarrhea?" "Could you have eaten any food that was spoiled?"     No 10. ANTIBIOTIC USE: "Are you taking antibiotics now or have you taken antibiotics in the past 2 months?"       No 11. OTHER SYMPTOMS: "Do you have any other symptoms?" (e.g., fever, blood in stool)       No 12. PREGNANCY: "Is there any chance you are pregnant?" "When was your last menstrual period?"       No  Protocols used: DIARRHEA-A-AH

## 2020-07-24 NOTE — Telephone Encounter (Signed)
Patient advised as directed below.She will wait a few more weeks to see if the diarrhea gets better.  Asked Najah about the oxygen. Per patient she wanted to let the provider know that Saturday night she was doing good  And she started to do house cleaning and later that day her breathing just got worse, so since she didn't have no one else to call she called the oxygen company and told them what was going on and one of the guy from the company was around the area and told her that he was on his way and came and took a look at the oxygen concentrator and put the flow from 2 to 4. She reports that at the end he swap the concentrator to a different one and since then she has been doing well.  She is not sure at what level she needs to have the oxygen concentrator at. She has left it at 4. She is not sure when she can drive.

## 2020-07-24 NOTE — Telephone Encounter (Signed)
Addressed in another phone message from same day to lessen phone message burden.   This note will be closed.

## 2020-07-24 NOTE — Telephone Encounter (Signed)
FYI. KW 

## 2020-07-24 NOTE — Telephone Encounter (Signed)
Could generic Cardizem or off brand of medication cause symptoms of diarrhea? Was going to advise patient to try otc Imodium since triage call denied symptoms of fever, nausea or abdominal pain and symptom of diarrhea sounds acute occurring less than 12hrs. KW

## 2020-07-24 NOTE — Telephone Encounter (Signed)
Cardizem can cause diarrhea. If the manufacturer changed from the pharmacy we may have to write the Rx for the brand instead so it will always come from the same manufacturer.   If she wants me to do that I will change Rx. If she wants to give it a couple days to see if diarrhea improves we can wait til Wednesday or Thursday to see if that gets better. She can use the Imodium to lessen the diarrhea if needed while we see if she adjust to the medication.   I know she is sensitive to medications and has some others she has to have the brand in.  Also there is another phone message about her home oxygen. I am confused by that message. Could we get more information on what she is asking about that. I have not yet seen the home health note, but am hoping I should get that soon and that will probably help me understand what is going on as well.  Thanks!

## 2020-07-24 NOTE — Telephone Encounter (Signed)
Patient is calling to let Tawanna Sat know that on Friday since she she came home from the home she has been placed on oxygen. Patient was having problems with her oxygen. She is has never used oxygen. And the company changed her oxygen and uped her oxygen levels. Patient just wanted to let [place] know. Please advise CB- [phone]

## 2020-07-24 NOTE — Telephone Encounter (Signed)
With her oxygen at 4L can she check her pulse ox and let us know the number?

## 2020-07-25 ENCOUNTER — Telehealth: Payer: Self-pay | Admitting: Physician Assistant

## 2020-07-25 NOTE — Telephone Encounter (Signed)
Spoke with patient and her Pulse ox was 96%-94 and HR:106 but after standing still for a few seconds was 97% HR: 95 reports that she is feeling sleepy and tired but this is not unsual for her.

## 2020-07-25 NOTE — Telephone Encounter (Signed)
Home Health Verbal Orders - Caller/Agency: [facility] Callback Number: [phone]  Requesting OT/PT/Skilled Nursing/Social Work/Speech Therapy: Nurse teaching for AFib and COPD mgmt  & PT and OT eval

## 2020-07-26 NOTE — Telephone Encounter (Addendum)
Ok that is better. I think she should stay on 4L 24/7.  Is her diarrhea getting any better?

## 2020-07-27 MED ORDER — CARDIZEM CD 120 MG PO CP24: 120.0000 mg | ORAL_CAPSULE | Freq: Every day | ORAL | 3 refills | Status: DC

## 2020-07-27 NOTE — Telephone Encounter (Addendum)
Stop trazodone, she can move so she does not accidentally take, or even throw away.  HH will be going out for therapy and monitoring. Another phone note addresses the ok for verbal orders.

## 2020-07-27 NOTE — Telephone Encounter (Signed)
Yes all are ok

## 2020-07-27 NOTE — Telephone Encounter (Signed)
She would like you to send  a Rx for the brand instead (for the Cardizem) because she already went twice.

## 2020-07-27 NOTE — Addendum Note (Signed)
Addended by: Mar Daring on: 07/27/2020 11:47 AM   Modules accepted: Orders

## 2020-07-27 NOTE — Telephone Encounter (Signed)
Verbal orders given to Jackie Turner with [facility]

## 2020-07-27 NOTE — Addendum Note (Signed)
Addended by: Mar Daring on: 07/27/2020 09:56 AM   Modules accepted: Orders

## 2020-07-27 NOTE — Telephone Encounter (Signed)
Patient advised as directed below. Reports that the diarrhea is a little better but is not getting any better.Reports She doesn't really remember the conversation about the diarrhea really well and she also reports that she doesn't remember why she was prescribed Trazodone. She is not taking it. She goes from not sleeping well to stating that she is feeling tired and very sleepy and sometimes she just doesn't feel like getting up or that she gets up and then wants to go back to sleep.

## 2020-07-27 NOTE — Telephone Encounter (Signed)
Cardizem brand sent in to [facility]

## 2020-07-28 ENCOUNTER — Telehealth: Payer: Self-pay

## 2020-07-28 NOTE — Telephone Encounter (Signed)
Pt. Reports her pharmacy did not got her Cardizem order for brand name only. Called pharmacy and gave verbal order from yesterday.

## 2020-08-01 NOTE — Progress Notes (Signed)
Established patient visit   Patient: Jackie Turner   DOB: 11/05/1936   84 y.o. Female  MRN: [id number] Visit Date: 08/03/2020  Today's healthcare provider: Mar Daring, PA-C   Chief Complaint  Patient presents with  . panic attacks   Subjective    HPI  Follow up for panic attacks  The patient was last seen for this 1 months ago. Changes made at last visit include started patient on low dose Trazodone 50mg  due to reported not sleeping well.   She reports poor compliance with treatment. She feels that condition is Improved. Patient states that she has been sleeping better and never pick up prescription from pharamcy. Patient denies symptoms of anxiety or panic attacks since last visit. She is not having side effects.   She is actually having increased fatigue. She reports she is sleeping more, has difficulties with getting up. Wants to stay in her recliner and sleeps mostly through the day. She denies any worsening SOB. Oxygen levels have bee much improved since her oxygen was increased to 4L. Also mentions that since the last visit she has not had the panic attacks in the shower like she had been. Overall her biggest complaint is the new and worsening fatigue.  -----------------------------------------------------------------------------------------   Patient Active Problem List   Diagnosis Date Noted  . GI bleeding 08/04/2020  . Depression, major, single episode, in partial remission ([place]) 06/22/2020  . Atrial fibrillation with rapid ventricular response ([place]) 06/13/2020  . Acute on suspect chronic respiratory failure with hypoxia ([place]) 06/13/2020  . Right hand pain 06/13/2020  . Fall at home, initial encounter 06/13/2020  . COPD with acute exacerbation ([place]) 06/13/2020  . Bronchiectasis without complication ([place]) 32/67/1245  . Lymphedema 05/17/2019  . Chronic venous insufficiency 05/17/2019  . Swelling of limb 02/10/2018  . Varicose veins of leg with  swelling, bilateral 02/10/2018  . Squamous cell carcinoma 09/22/2017  . Hypoxia 09/27/2015  . Abnormal CXR 09/27/2015  . Asthma with allergic rhinitis 08/31/2015  . Back ache 08/31/2015  . Chronic interstitial cystitis 08/31/2015  . Grief reaction 08/31/2015  . Hyperlipidemia 06/26/2015  . Migraine 06/26/2015  . Asthma 06/26/2015  . IBS (irritable bowel syndrome) 06/26/2015  . Osteoporosis 06/26/2015  . Fatigue 06/26/2015  . Lumbar radiculopathy 04/27/2013  . Urinary urgency 01/08/2012  . Frequency of urination 01/08/2012   Past Medical History:  Diagnosis Date  . Allergy   . Asthma   . Hyperlipidemia   . Osteoporosis    Social History   Tobacco Use  . Smoking status: Never Smoker  . Smokeless tobacco: Never Used  Vaping Use  . Vaping Use: Never used  Substance Use Topics  . Alcohol use: No  . Drug use: No   Allergies  Allergen Reactions  . Nsaids Other (See Comments)    History of gastric ulcer  . Amoxicillin Nausea Only and Rash  . Ibandronic Acid Other (See Comments)    Muscle weakness - advised not to take  **BONIVA** - drug name  . Actonel  [Risedronate] Other (See Comments)    Gastric ulcers  . Antihistamines, Chlorpheniramine-Type   . Aspirin     Gastric ulcer  . Buspar [Buspirone] Other (See Comments)    Pt does not remember reaction   . Butalbital-Aspirin-Caffeine Other (See Comments)    Other reaction(s): Unknown  . Clarithromycin Other (See Comments)    Bloating  *BIAXIN*  . Codeine   . Gatifloxacin Other (See Comments)  . Hydrocodone-Guaifenesin Nausea  Only    CODICLEAR DH STYRUP   . Moxifloxacin   . Oxycodone Other (See Comments)    Dizziness  . Penicillins Other (See Comments)  . Risedronate Sodium     Gastric ulcer  . Tylenol With Codeine #3  [Acetaminophen-Codeine]     Other reaction(s): Unknown  . Raloxifene Other (See Comments)    Bloating Bloating  *EVISTA*     Medications: No facility-administered medications prior to  visit.   Outpatient Medications Prior to Visit  Medication Sig  . apixaban (ELIQUIS) 2.5 MG TABS tablet Take 1 tablet (2.5 mg total) by mouth 2 (two) times daily.  . calcium carbonate (CALTRATE 600) 1500 (600 Ca) MG TABS tablet Take 1 tablet by mouth 2 (two) times daily with a meal.   . CARDIZEM CD 120 MG 24 hr capsule Take 1 capsule (120 mg total) by mouth daily. Brand only  . Clobetasol Propionate Emulsion 0.05 % topical foam Apply 1 application topically 2 (two) times daily.   Marland Kitchen dicyclomine (BENTYL) 10 MG capsule Take 1 capsule (10 mg total) by mouth 3 (three) times daily as needed for spasms.  Marland Kitchen doxepin (SINEQUAN) 10 MG capsule Take 3 capsules (30 mg total) by mouth at bedtime.  . feeding supplement, ENSURE ENLIVE, (ENSURE ENLIVE) LIQD Take 237 mLs by mouth 2 (two) times daily between meals.  . fluticasone (FLONASE) 50 MCG/ACT nasal spray Place 1-2 sprays into both nostrils daily.   . Fluticasone-Umeclidin-Vilant (TRELEGY ELLIPTA) 100-62.5-25 MCG/INH AEPB Inhale 1 puff into the lungs daily.  Marland Kitchen nystatin (MYCOSTATIN) 100000 UNIT/ML suspension TAKE 5 MLS BY MOUTH 4 TIMES A DAY. (Patient taking differently: Take 5 mLs by mouth 4 (four) times daily. )  . oxybutynin (DITROPAN XL) 15 MG 24 hr tablet Take 1 tablet (15 mg total) by mouth at bedtime.  . pentosan polysulfate (ELMIRON) 100 MG capsule Take 1 capsule (100 mg total) by mouth in the morning and at bedtime. (Patient taking differently: Take 100 mg by mouth 2 (two) times daily as needed (urinary pain). )  . ULTRAM 50 MG tablet Take 1 tablet (50 mg total) by mouth every 6 (six) hours as needed. (Patient taking differently: Take 50 mg by mouth every 6 (six) hours as needed for moderate pain. )  . Urea 40 % LOTN Apply 1 application topically daily as needed (dry skin).   . VENTOLIN HFA 108 (90 Base) MCG/ACT inhaler INHALE TWO PUFFS INTO THE LUNGS EVERY 4 HOURS AS NEEDED FOR WHEEZING OR SHORTNESS OF BREATH (Patient taking differently: Inhale 2 puffs  into the lungs every 4 (four) hours as needed for wheezing or shortness of breath. )  . [DISCONTINUED] docusate sodium (STOOL SOFTENER) 100 MG capsule Take 1 capsule (100 mg total) by mouth 2 (two) times daily. (Patient not taking: Reported on 07/21/2020)    Review of Systems  Constitutional: Positive for fatigue.  Respiratory: Positive for shortness of breath (chronic and improved on 4L). Negative for wheezing.   Cardiovascular: Negative for chest pain, palpitations and leg swelling.  Psychiatric/Behavioral: Positive for confusion (has issues with memory). Negative for dysphoric mood and sleep disturbance. The patient is not nervous/anxious.     Last CBC Lab Results  Component Value Date   WBC 7.3 08/08/2020   HGB 8.9 (L) 08/08/2020   HCT 30.4 (L) 08/08/2020   MCV 81.1 08/08/2020   MCH 23.7 (L) 08/08/2020   RDW 18.6 (H) 08/08/2020   PLT 265 09/38/1829   Last metabolic panel Lab Results  Component Value  Date   GLUCOSE 99 08/08/2020   NA 146 (H) 08/08/2020   K 3.3 (L) 08/08/2020   CL 99 08/08/2020   CO2 36 (H) 08/08/2020   BUN 16 08/08/2020   CREATININE 0.64 08/08/2020   GFRNONAA >60 08/08/2020   GFRAA >60 08/08/2020   CALCIUM 8.3 (L) 08/08/2020   PHOS 2.3 (L) 08/08/2020   PROT 5.8 (L) 08/08/2020   ALBUMIN 3.3 (L) 08/08/2020   LABGLOB 2.1 08/03/2020   AGRATIO 1.9 08/03/2020   BILITOT 0.7 08/08/2020   ALKPHOS 60 08/08/2020   AST 15 08/08/2020   ALT 11 08/08/2020   ANIONGAP 11 08/08/2020   Last lipids Lab Results  Component Value Date   CHOL 216 (H) 05/21/2018   HDL 78 05/21/2018   LDLCALC 124 (H) 05/21/2018   TRIG 70 05/21/2018   CHOLHDL 3.0 06/03/2016      Objective    BP 128/80   Pulse 78   Temp 99 F (37.2 C) (Oral)   Resp 16   Wt 119 lb (54 kg)   SpO2 100% Comment: on 4 liters o2  BMI 24.87 kg/m  BP Readings from Last 3 Encounters:  08/08/20 (!) 127/91  08/03/20 128/80  06/22/20 134/85   Wt Readings from Last 3 Encounters:  08/08/20 121 lb 8 oz  (55.1 kg)  08/03/20 119 lb (54 kg)  06/22/20 113 lb (51.3 kg)      Physical Exam Vitals reviewed.  Constitutional:      General: She is not in acute distress.    Appearance: Normal appearance. She is well-developed. She is not ill-appearing or diaphoretic.  Cardiovascular:     Rate and Rhythm: Normal rate. Rhythm irregularly irregular.     Heart sounds: Normal heart sounds. No murmur heard.  No friction rub. No gallop.   Pulmonary:     Effort: Pulmonary effort is normal. No respiratory distress.     Breath sounds: Normal breath sounds. No wheezing or rales.  Musculoskeletal:     Cervical back: Normal range of motion and neck supple.  Neurological:     Mental Status: She is alert.       Results for orders placed or performed in visit on 08/03/20  CBC w/Diff/Platelet  Result Value Ref Range   WBC 7.2 3.4 - 10.8 x10E3/uL   RBC 2.92 (L) 3.77 - 5.28 x10E6/uL   Hemoglobin 6.4 (LL) 11.1 - 15.9 g/dL   Hematocrit 22.1 (L) 34.0 - 46.6 %   MCV 76 (L) 79 - 97 fL   MCH 21.9 (L) 26.6 - 33.0 pg   MCHC 29.0 (L) 31 - 35 g/dL   RDW 15.2 11.7 - 15.4 %   Platelets 338 150 - 450 x10E3/uL   Neutrophils 70 Not Estab. %   Lymphs 12 Not Estab. %   Monocytes 12 Not Estab. %   Eos 4 Not Estab. %   Basos 1 Not Estab. %   Neutrophils Absolute 5.0 1 - 7 x10E3/uL   Lymphocytes Absolute 0.9 0 - 3 x10E3/uL   Monocytes Absolute 0.9 0 - 0 x10E3/uL   EOS (ABSOLUTE) 0.3 0.0 - 0.4 x10E3/uL   Basophils Absolute 0.1 0 - 0 x10E3/uL   Immature Granulocytes 1 Not Estab. %   Immature Grans (Abs) 0.1 0.0 - 0.1 x10E3/uL  Comprehensive Metabolic Panel (CMET)  Result Value Ref Range   Glucose 158 (H) 65 - 99 mg/dL   BUN 21 8 - 27 mg/dL   Creatinine, Ser 0.73 0.57 - 1.00 mg/dL  GFR calc non Af Amer 77 >59 mL/min/1.73   GFR calc Af Amer 89 >59 mL/min/1.73   BUN/Creatinine Ratio 29 (H) 12 - 28   Sodium 142 134 - 144 mmol/L   Potassium 4.9 3.5 - 5.2 mmol/L   Chloride 100 96 - 106 mmol/L   CO2 29 20 - 29  mmol/L   Calcium 9.3 8.7 - 10.3 mg/dL   Total Protein 6.0 6.0 - 8.5 g/dL   Albumin 3.9 3.6 - 4.6 g/dL   Globulin, Total 2.1 1.5 - 4.5 g/dL   Albumin/Globulin Ratio 1.9 1.2 - 2.2   Bilirubin Total 0.2 0.0 - 1.2 mg/dL   Alkaline Phosphatase 75 48 - 121 IU/L   AST 17 0 - 40 IU/L   ALT 10 0 - 32 IU/L  TSH  Result Value Ref Range   TSH 2.850 0.450 - 4.500 uIU/mL  B12 and Folate Panel  Result Value Ref Range   Vitamin B-12 222 (L) 232 - 1,245 pg/mL   Folate >20.0 >3.0 ng/mL    Assessment & Plan     1. Daytime somnolence Worsening. Will check labs as below since has had new onset atrial fibrillation, started on Eliquis and oxygen in July. I will f/u pending results. If labs are normal may consider a sleep study. - CBC w/Diff/Platelet - Comprehensive Metabolic Panel (CMET) - TSH - B12 and Folate Panel  2. Fatigue, unspecified type See above medical treatment plan. - CBC w/Diff/Platelet - Comprehensive Metabolic Panel (CMET) - TSH - B12 and Folate Panel  3. Chronic pain of right knee Avoiding oral NSAIDs. Will use topical voltaren.  - diclofenac Sodium (VOLTAREN) 1 % GEL; Apply 4 g topically 4 (four) times daily.  Dispense: 400 g; Refill: 1  4. Pain of right hand From fall. Was xrayed and no fractures present. Discussed using topical hydrocortisone OTC on the area of the right hand that has pain.  - diclofenac Sodium (VOLTAREN) 1 % GEL; Apply 4 g topically 4 (four) times daily.  Dispense: 400 g; Refill: 1  5. Numbness and tingling in left hand Discussed either carpal tunnel recurring (patient has had surgery in the past) vs ulnar nerve entrapment at the elbow. I lean more toward the ulnar nerve due to the medial distribution of the numbness and tingling present.  - diclofenac Sodium (VOLTAREN) 1 % GEL; Apply 4 g topically 4 (four) times daily.  Dispense: 400 g; Refill: 1   No follow-ups on file.      Reynolds Bowl, PA-C, have reviewed all documentation for this  visit. The documentation on 08/08/20 for the exam, diagnosis, procedures, and orders are all accurate and complete.   Rubye Beach  [facility] [phone] (phone) [phone] (fax)  [place]

## 2020-08-03 ENCOUNTER — Other Ambulatory Visit: Payer: Self-pay

## 2020-08-03 ENCOUNTER — Ambulatory Visit (INDEPENDENT_AMBULATORY_CARE_PROVIDER_SITE_OTHER): Payer: Medicare HMO | Admitting: Physician Assistant

## 2020-08-03 ENCOUNTER — Encounter: Payer: Self-pay | Admitting: Physician Assistant

## 2020-08-03 VITALS — BP 128/80 | HR 78 | Temp 99.0°F | Resp 16 | Wt 119.0 lb

## 2020-08-03 DIAGNOSIS — R200 Anesthesia of skin: Secondary | ICD-10-CM

## 2020-08-03 DIAGNOSIS — M79641 Pain in right hand: Secondary | ICD-10-CM | POA: Diagnosis not present

## 2020-08-03 DIAGNOSIS — M25561 Pain in right knee: Secondary | ICD-10-CM | POA: Diagnosis not present

## 2020-08-03 DIAGNOSIS — R202 Paresthesia of skin: Secondary | ICD-10-CM

## 2020-08-03 DIAGNOSIS — R5383 Other fatigue: Secondary | ICD-10-CM | POA: Diagnosis not present

## 2020-08-03 DIAGNOSIS — G8929 Other chronic pain: Secondary | ICD-10-CM

## 2020-08-03 DIAGNOSIS — R400 Somnolence: Secondary | ICD-10-CM | POA: Diagnosis not present

## 2020-08-03 MED ORDER — DICLOFENAC SODIUM 1 % EX GEL: 4.0000 g | Freq: Four times a day (QID) | CUTANEOUS | 1 refills | Status: DC

## 2020-08-03 NOTE — Patient Instructions (Signed)
Magnesium sulfate 250mg  for cramps as needed

## 2020-08-04 ENCOUNTER — Other Ambulatory Visit: Payer: Self-pay

## 2020-08-04 ENCOUNTER — Other Ambulatory Visit: Payer: Self-pay | Admitting: Physician Assistant

## 2020-08-04 ENCOUNTER — Encounter: Payer: Self-pay | Admitting: Emergency Medicine

## 2020-08-04 ENCOUNTER — Telehealth: Payer: Self-pay

## 2020-08-04 ENCOUNTER — Inpatient Hospital Stay
Admission: EM | Admit: 2020-08-04 | Discharge: 2020-08-15 | DRG: 377 | Disposition: A | Discharge status: Home Health | Payer: Medicare HMO | Attending: Internal Medicine | Admitting: Internal Medicine

## 2020-08-04 DIAGNOSIS — Z803 Family history of malignant neoplasm of breast: Secondary | ICD-10-CM

## 2020-08-04 DIAGNOSIS — J9811 Atelectasis: Secondary | ICD-10-CM | POA: Diagnosis not present

## 2020-08-04 DIAGNOSIS — J9622 Acute and chronic respiratory failure with hypercapnia: Secondary | ICD-10-CM | POA: Diagnosis not present

## 2020-08-04 DIAGNOSIS — E872 Acidosis: Secondary | ICD-10-CM | POA: Diagnosis not present

## 2020-08-04 DIAGNOSIS — D519 Vitamin B12 deficiency anemia, unspecified: Secondary | ICD-10-CM | POA: Diagnosis present

## 2020-08-04 DIAGNOSIS — F419 Anxiety disorder, unspecified: Secondary | ICD-10-CM | POA: Diagnosis not present

## 2020-08-04 DIAGNOSIS — T502X5A Adverse effect of carbonic-anhydrase inhibitors, benzothiadiazides and other diuretics, initial encounter: Secondary | ICD-10-CM | POA: Diagnosis not present

## 2020-08-04 DIAGNOSIS — J9621 Acute and chronic respiratory failure with hypoxia: Secondary | ICD-10-CM | POA: Diagnosis present

## 2020-08-04 DIAGNOSIS — J449 Chronic obstructive pulmonary disease, unspecified: Secondary | ICD-10-CM | POA: Diagnosis present

## 2020-08-04 DIAGNOSIS — K922 Gastrointestinal hemorrhage, unspecified: Secondary | ICD-10-CM | POA: Diagnosis present

## 2020-08-04 DIAGNOSIS — R0902 Hypoxemia: Secondary | ICD-10-CM

## 2020-08-04 DIAGNOSIS — R0600 Dyspnea, unspecified: Secondary | ICD-10-CM | POA: Diagnosis not present

## 2020-08-04 DIAGNOSIS — Z888 Allergy status to other drugs, medicaments and biological substances status: Secondary | ICD-10-CM

## 2020-08-04 DIAGNOSIS — K581 Irritable bowel syndrome with constipation: Secondary | ICD-10-CM | POA: Diagnosis present

## 2020-08-04 DIAGNOSIS — D500 Iron deficiency anemia secondary to blood loss (chronic): Secondary | ICD-10-CM | POA: Diagnosis not present

## 2020-08-04 DIAGNOSIS — E876 Hypokalemia: Secondary | ICD-10-CM | POA: Diagnosis not present

## 2020-08-04 DIAGNOSIS — Z880 Allergy status to penicillin: Secondary | ICD-10-CM

## 2020-08-04 DIAGNOSIS — Z79899 Other long term (current) drug therapy: Secondary | ICD-10-CM | POA: Diagnosis not present

## 2020-08-04 DIAGNOSIS — Z20822 Contact with and (suspected) exposure to covid-19: Secondary | ICD-10-CM | POA: Diagnosis present

## 2020-08-04 DIAGNOSIS — I480 Paroxysmal atrial fibrillation: Secondary | ICD-10-CM | POA: Diagnosis not present

## 2020-08-04 DIAGNOSIS — K640 First degree hemorrhoids: Secondary | ICD-10-CM | POA: Diagnosis present

## 2020-08-04 DIAGNOSIS — J9611 Chronic respiratory failure with hypoxia: Secondary | ICD-10-CM | POA: Diagnosis present

## 2020-08-04 DIAGNOSIS — Z886 Allergy status to analgesic agent status: Secondary | ICD-10-CM

## 2020-08-04 DIAGNOSIS — I4891 Unspecified atrial fibrillation: Secondary | ICD-10-CM | POA: Diagnosis present

## 2020-08-04 DIAGNOSIS — E870 Hyperosmolality and hypernatremia: Secondary | ICD-10-CM | POA: Diagnosis not present

## 2020-08-04 DIAGNOSIS — J9602 Acute respiratory failure with hypercapnia: Secondary | ICD-10-CM | POA: Diagnosis not present

## 2020-08-04 DIAGNOSIS — M810 Age-related osteoporosis without current pathological fracture: Secondary | ICD-10-CM | POA: Diagnosis present

## 2020-08-04 DIAGNOSIS — Z8711 Personal history of peptic ulcer disease: Secondary | ICD-10-CM

## 2020-08-04 DIAGNOSIS — D62 Acute posthemorrhagic anemia: Secondary | ICD-10-CM | POA: Diagnosis present

## 2020-08-04 DIAGNOSIS — K921 Melena: Principal | ICD-10-CM | POA: Diagnosis present

## 2020-08-04 DIAGNOSIS — J9601 Acute respiratory failure with hypoxia: Secondary | ICD-10-CM | POA: Diagnosis not present

## 2020-08-04 DIAGNOSIS — R0602 Shortness of breath: Secondary | ICD-10-CM

## 2020-08-04 DIAGNOSIS — Z8249 Family history of ischemic heart disease and other diseases of the circulatory system: Secondary | ICD-10-CM

## 2020-08-04 DIAGNOSIS — Z885 Allergy status to narcotic agent status: Secondary | ICD-10-CM

## 2020-08-04 DIAGNOSIS — Z7901 Long term (current) use of anticoagulants: Secondary | ICD-10-CM | POA: Diagnosis not present

## 2020-08-04 DIAGNOSIS — Z9981 Dependence on supplemental oxygen: Secondary | ICD-10-CM

## 2020-08-04 DIAGNOSIS — K635 Polyp of colon: Secondary | ICD-10-CM | POA: Diagnosis present

## 2020-08-04 DIAGNOSIS — K589 Irritable bowel syndrome without diarrhea: Secondary | ICD-10-CM | POA: Diagnosis present

## 2020-08-04 DIAGNOSIS — E785 Hyperlipidemia, unspecified: Secondary | ICD-10-CM | POA: Diagnosis present

## 2020-08-04 DIAGNOSIS — R5383 Other fatigue: Secondary | ICD-10-CM | POA: Diagnosis present

## 2020-08-04 DIAGNOSIS — I5033 Acute on chronic diastolic (congestive) heart failure: Secondary | ICD-10-CM | POA: Diagnosis not present

## 2020-08-04 DIAGNOSIS — R062 Wheezing: Secondary | ICD-10-CM

## 2020-08-04 DIAGNOSIS — Z881 Allergy status to other antibiotic agents status: Secondary | ICD-10-CM

## 2020-08-04 LAB — CBC
HCT: 21.5 % — ABNORMAL LOW (ref 36.0–46.0)
Hemoglobin: 6.3 g/dL — ABNORMAL LOW (ref 12.0–15.0)
MCH: 22.7 pg — ABNORMAL LOW (ref 26.0–34.0)
MCHC: 29.3 g/dL — ABNORMAL LOW (ref 30.0–36.0)
MCV: 77.6 fL — ABNORMAL LOW (ref 80.0–100.0)
Platelets: 323 10*3/uL (ref 150–400)
RBC: 2.77 MIL/uL — ABNORMAL LOW (ref 3.87–5.11)
RDW: 17.2 % — ABNORMAL HIGH (ref 11.5–15.5)
WBC: 8.5 10*3/uL (ref 4.0–10.5)
nRBC: 0.4 % — ABNORMAL HIGH (ref 0.0–0.2)

## 2020-08-04 LAB — CBC WITH DIFFERENTIAL/PLATELET
Basophils Absolute: 0.1 10*3/uL (ref 0.0–0.2)
Basos: 1 %
EOS (ABSOLUTE): 0.3 10*3/uL (ref 0.0–0.4)
Eos: 4 %
Hematocrit: 22.1 % — ABNORMAL LOW (ref 34.0–46.6)
Hemoglobin: 6.4 g/dL — CL (ref 11.1–15.9)
Immature Grans (Abs): 0.1 10*3/uL (ref 0.0–0.1)
Immature Granulocytes: 1 %
Lymphocytes Absolute: 0.9 10*3/uL (ref 0.7–3.1)
Lymphs: 12 %
MCH: 21.9 pg — ABNORMAL LOW (ref 26.6–33.0)
MCHC: 29 g/dL — ABNORMAL LOW (ref 31.5–35.7)
MCV: 76 fL — ABNORMAL LOW (ref 79–97)
Monocytes Absolute: 0.9 10*3/uL (ref 0.1–0.9)
Monocytes: 12 %
Neutrophils Absolute: 5 10*3/uL (ref 1.4–7.0)
Neutrophils: 70 %
Platelets: 338 10*3/uL (ref 150–450)
RBC: 2.92 x10E6/uL — ABNORMAL LOW (ref 3.77–5.28)
RDW: 15.2 % (ref 11.7–15.4)
WBC: 7.2 10*3/uL (ref 3.4–10.8)

## 2020-08-04 LAB — COMPREHENSIVE METABOLIC PANEL
ALT: 10 IU/L (ref 0–32)
ALT: 14 U/L (ref 0–44)
AST: 17 IU/L (ref 0–40)
AST: 20 U/L (ref 15–41)
Albumin/Globulin Ratio: 1.9 (ref 1.2–2.2)
Albumin: 3.9 g/dL (ref 3.6–4.6)
Albumin: 3.7 g/dL (ref 3.5–5.0)
Alkaline Phosphatase: 75 IU/L (ref 48–121)
Alkaline Phosphatase: 61 U/L (ref 38–126)
Anion gap: 9 (ref 5–15)
BUN/Creatinine Ratio: 29 — ABNORMAL HIGH (ref 12–28)
BUN: 21 mg/dL (ref 8–27)
BUN: 27 mg/dL — ABNORMAL HIGH (ref 8–23)
Bilirubin Total: 0.2 mg/dL (ref 0.0–1.2)
CO2: 29 mmol/L (ref 20–29)
CO2: 32 mmol/L (ref 22–32)
Calcium: 9.3 mg/dL (ref 8.7–10.3)
Calcium: 9.1 mg/dL (ref 8.9–10.3)
Chloride: 100 mmol/L (ref 96–106)
Chloride: 99 mmol/L (ref 98–111)
Creatinine, Ser: 0.73 mg/dL (ref 0.57–1.00)
Creatinine, Ser: 0.69 mg/dL (ref 0.44–1.00)
GFR calc Af Amer: 89 mL/min/{1.73_m2} (ref >59)
GFR calc Af Amer: >60 mL/min (ref >60)
GFR calc non Af Amer: 77 mL/min/{1.73_m2} (ref >59)
GFR calc non Af Amer: >60 mL/min (ref >60)
Globulin, Total: 2.1 g/dL (ref 1.5–4.5)
Glucose, Bld: 172 mg/dL — ABNORMAL HIGH (ref 70–99)
Glucose: 158 mg/dL — ABNORMAL HIGH (ref 65–99)
Potassium: 4.9 mmol/L (ref 3.5–5.2)
Potassium: 4.5 mmol/L (ref 3.5–5.1)
Sodium: 142 mmol/L (ref 134–144)
Sodium: 140 mmol/L (ref 135–145)
Total Bilirubin: 0.6 mg/dL (ref 0.3–1.2)
Total Protein: 6 g/dL (ref 6.0–8.5)
Total Protein: 6.5 g/dL (ref 6.5–8.1)

## 2020-08-04 LAB — ABO/RH: ABO/RH(D): A POS

## 2020-08-04 LAB — B12 AND FOLATE PANEL
Folate: >20 ng/mL (ref >3.0)
Vitamin B-12: 222 pg/mL — ABNORMAL LOW (ref 232–1245)

## 2020-08-04 LAB — TSH: TSH: 2.85 u[IU]/mL (ref 0.450–4.500)

## 2020-08-04 LAB — PROTIME-INR
INR: 1.1 (ref 0.8–1.2)
Prothrombin Time: 13.6 seconds (ref 11.4–15.2)

## 2020-08-04 LAB — PREPARE RBC (CROSSMATCH)

## 2020-08-04 MED ORDER — SODIUM CHLORIDE 0.9 % IV SOLN: INTRAVENOUS | Status: DC

## 2020-08-04 MED ORDER — ALBUTEROL SULFATE (2.5 MG/3ML) 0.083% IN NEBU: 3.0000 mL | INHALATION_SOLUTION | RESPIRATORY_TRACT | Status: DC | PRN

## 2020-08-04 MED ORDER — ACETAMINOPHEN 325 MG PO TABS
650.0000 mg | ORAL_TABLET | Freq: Four times a day (QID) | ORAL | Status: DC | PRN
  Administered 2020-08-15: 650 mg via ORAL
  Filled 2020-08-04: qty 2

## 2020-08-04 MED ORDER — SODIUM CHLORIDE 0.9 % IV SOLN
80.0000 mg | Freq: Once | INTRAVENOUS | Status: AC
  Administered 2020-08-05: 80 mg via INTRAVENOUS
  Filled 2020-08-04: qty 80

## 2020-08-04 MED ORDER — FLUTICASONE-UMECLIDIN-VILANT 100-62.5-25 MCG/INH IN AEPB: 1.0000 | INHALATION_SPRAY | Freq: Every day | RESPIRATORY_TRACT | Status: DC

## 2020-08-04 MED ORDER — DILTIAZEM HCL ER COATED BEADS 120 MG PO CP24
120.0000 mg | ORAL_CAPSULE | Freq: Every day | ORAL | Status: DC
  Administered 2020-08-05 – 2020-08-07 (×3): 120 mg via ORAL
  Filled 2020-08-04 (×3): qty 1

## 2020-08-04 MED ORDER — PANTOPRAZOLE SODIUM 40 MG IV SOLR
40.0000 mg | Freq: Once | INTRAVENOUS | Status: AC
  Administered 2020-08-04: 40 mg via INTRAVENOUS
  Filled 2020-08-04: qty 40

## 2020-08-04 MED ORDER — TRAZODONE HCL 50 MG PO TABS
25.0000 mg | ORAL_TABLET | Freq: Every evening | ORAL | Status: DC | PRN
  Administered 2020-08-05 (×2): 25 mg via ORAL
  Filled 2020-08-04 (×5): qty 1

## 2020-08-04 MED ORDER — DICYCLOMINE HCL 10 MG PO CAPS
10.0000 mg | ORAL_CAPSULE | Freq: Three times a day (TID) | ORAL | Status: DC | PRN
  Filled 2020-08-04: qty 1

## 2020-08-04 MED ORDER — ONDANSETRON HCL 4 MG PO TABS
4.0000 mg | ORAL_TABLET | Freq: Four times a day (QID) | ORAL | Status: DC | PRN
  Administered 2020-08-10: 4 mg via ORAL
  Filled 2020-08-04: qty 1

## 2020-08-04 MED ORDER — DOXEPIN HCL 10 MG PO CAPS
30.0000 mg | ORAL_CAPSULE | Freq: Every day | ORAL | Status: DC
  Administered 2020-08-05 – 2020-08-14 (×8): 30 mg via ORAL
  Filled 2020-08-04 (×12): qty 3

## 2020-08-04 MED ORDER — ACETAMINOPHEN 650 MG RE SUPP: 650.0000 mg | Freq: Four times a day (QID) | RECTAL | Status: DC | PRN

## 2020-08-04 MED ORDER — SODIUM CHLORIDE 0.9 % IV SOLN
10.0000 mL/h | Freq: Once | INTRAVENOUS | Status: AC
  Administered 2020-08-04: 10 mL/h via INTRAVENOUS

## 2020-08-04 MED ORDER — PANTOPRAZOLE SODIUM 40 MG IV SOLR: 40.0000 mg | Freq: Two times a day (BID) | INTRAVENOUS | Status: DC

## 2020-08-04 MED ORDER — CLOBETASOL PROPIONATE EMULSION 0.05 % EX FOAM: Freq: Two times a day (BID) | CUTANEOUS | Status: DC

## 2020-08-04 MED ORDER — TRAMADOL HCL 50 MG PO TABS
50.0000 mg | ORAL_TABLET | Freq: Four times a day (QID) | ORAL | Status: DC | PRN
  Administered 2020-08-05 – 2020-08-14 (×14): 50 mg via ORAL
  Filled 2020-08-04 (×14): qty 1

## 2020-08-04 MED ORDER — OXYBUTYNIN CHLORIDE ER 5 MG PO TB24
15.0000 mg | ORAL_TABLET | Freq: Every day | ORAL | Status: DC
  Administered 2020-08-06 – 2020-08-14 (×7): 15 mg via ORAL
  Filled 2020-08-04 (×12): qty 1

## 2020-08-04 MED ORDER — ONDANSETRON HCL 4 MG/2ML IJ SOLN
4.0000 mg | Freq: Four times a day (QID) | INTRAMUSCULAR | Status: DC | PRN
  Administered 2020-08-12 – 2020-08-13 (×2): 4 mg via INTRAVENOUS
  Filled 2020-08-04 (×2): qty 2

## 2020-08-04 MED ORDER — PENTOSAN POLYSULFATE SODIUM 100 MG PO CAPS
100.0000 mg | ORAL_CAPSULE | Freq: Two times a day (BID) | ORAL | Status: DC
  Administered 2020-08-05 – 2020-08-15 (×12): 100 mg via ORAL
  Filled 2020-08-04 (×23): qty 1

## 2020-08-04 MED ORDER — SODIUM CHLORIDE 0.9 % IV SOLN
8.0000 mg/h | INTRAVENOUS | Status: DC
  Administered 2020-08-05 – 2020-08-06 (×4): 8 mg/h via INTRAVENOUS
  Filled 2020-08-04 (×6): qty 80

## 2020-08-04 NOTE — H&P (Signed)
[place]   PATIENT NAME: Jackie Turner    MR#:  [id number]  DATE OF BIRTH:  [birth year]  DATE OF ADMISSION:  08/04/2020  PRIMARY CARE PHYSICIAN: Mar Daring, PA-C   REQUESTING/REFERRING PHYSICIAN: Duffy Bruce, MD  CHIEF COMPLAINT:   Chief Complaint  Patient presents with  . Fatigue  . Abnormal Lab    HISTORY OF PRESENT ILLNESS:  Jackie Turner  is a 84 y.o. Caucasian female with a known history of asthma, dyslipidemia, atrial fibrillation on Eliquis and osteoporosis, who presented to the emergency room with acute onset of likely recurrent melena that she describes as dark stools.  She was started on p.o. Eliquis for atrial fibrillation in early July.  She denies any nausea or vomiting or heartburn or abdominal pain.  She admitted however to dry heaves which has been intermittent lately.  No bright red bleeding per rectum.  No other bleeding diathesis.  No chest pain or dyspnea or cough or wheezing or hemoptysis.  No dysuria, oliguria or hematuria or flank pain.Marland Kitchen  Upon presentation to the emergency room, temperature was 100.2 respiratory rate 25 and blood pressure 145/87 with pulse 79 9% on 4 L of O2 by nasal cannula.  Labs revealed a glucose of 172 and otherwise unremarkable CMP.  CBC showed hemoglobin of 6.3 hematocrit 21.5 compared to 6.4 and 22.1 yesterday and down from 11.4 and 35.8 on 06/22/2020. EKG showed atrial fibrillation with rapid ventricular response of 104.  She was given 40 mg of IV Protonix in the ER.  She will be admitted to a progressive unit bed for further evaluation and management. PAST MEDICAL HISTORY:   Past Medical History:  Diagnosis Date  . Allergy   . Asthma   . Hyperlipidemia   . Osteoporosis   Atrial fibrillation on Eliquis COPD -Bronchiectasis PAST SURGICAL HISTORY:   Past Surgical History:  Procedure Laterality Date  . ABDOMINAL HYSTERECTOMY  2003  . BREAST BIOPSY  1976, 1978   benign  . carpal tunnel repair Bilateral    R  1990; L 1997  . EYE SURGERY Bilateral    cataract extraction  . HAND TENDON SURGERY Right    Trigger finger  . IR RADIOLOGIST EVAL & MGMT  06/13/2017  . IR VERTEBROPLASTY CERV/THOR BX INC UNI/BIL INC/INJECT/IMAGING  06/17/2017  . TONSILLECTOMY  1961    SOCIAL HISTORY:   Social History   Tobacco Use  . Smoking status: Never Smoker  . Smokeless tobacco: Never Used  Substance Use Topics  . Alcohol use: No    FAMILY HISTORY:   Family History  Problem Relation Age of Onset  . Heart disease Mother   . CAD Mother   . Congestive Heart Failure Mother   . Osteoarthritis Mother   . Cancer Sister        breast  . Breast cancer Sister     DRUG ALLERGIES:   Allergies  Allergen Reactions  . Nsaids Other (See Comments)    History of gastric ulcer  . Amoxicillin Nausea Only and Rash  . Ibandronic Acid Other (See Comments)    Muscle weakness - advised not to take  **BONIVA** - drug name  . Actonel  [Risedronate] Other (See Comments)    Gastric ulcers  . Antihistamines, Chlorpheniramine-Type   . Aspirin     Gastric ulcer  . Buspar [Buspirone] Other (See Comments)    Pt does not remember reaction   . Butalbital-Aspirin-Caffeine Other (See Comments)    Other reaction(s):  Unknown  . Clarithromycin Other (See Comments)    Bloating  *BIAXIN*  . Codeine   . Gatifloxacin Other (See Comments)  . Hydrocodone-Guaifenesin Nausea Only    CODICLEAR DH STYRUP   . Moxifloxacin   . Oxycodone Other (See Comments)    Dizziness  . Penicillins Other (See Comments)  . Risedronate Sodium     Gastric ulcer  . Tylenol With Codeine #3  [Acetaminophen-Codeine]     Other reaction(s): Unknown  . Raloxifene Other (See Comments)    Bloating Bloating  *EVISTA*    REVIEW OF SYSTEMS:   ROS As per history of present illness. All pertinent systems were reviewed above. Constitutional, HEENT, cardiovascular, respiratory, GI, GU, musculoskeletal, neuro, psychiatric, endocrine, integumentary and  hematologic systems were reviewed and are otherwise negative/unremarkable except for positive findings mentioned above in the HPI.   MEDICATIONS AT HOME:   Prior to Admission medications   Medication Sig Start Date End Date Taking? Authorizing Provider  apixaban (ELIQUIS) 2.5 MG TABS tablet Take 1 tablet (2.5 mg total) by mouth 2 (two) times daily. 06/22/20   Mar Daring, PA-C  calcium carbonate (CALTRATE 600) 1500 (600 Ca) MG TABS tablet Take by mouth 2 (two) times daily with a meal. 06/22/20   Burnette, Clearnce Sorrel, PA-C  CARDIZEM CD 120 MG 24 hr capsule Take 1 capsule (120 mg total) by mouth daily. Brand only 07/27/20   Mar Daring, PA-C  Clobetasol Propionate Emulsion 0.05 % topical foam Apply topically 2 (two) times daily. 06/22/20   Mar Daring, PA-C  diclofenac Sodium (VOLTAREN) 1 % GEL Apply 4 g topically 4 (four) times daily. 08/03/20   Mar Daring, PA-C  dicyclomine (BENTYL) 10 MG capsule Take 1 capsule (10 mg total) by mouth 3 (three) times daily as needed for spasms. 06/22/20   Mar Daring, PA-C  docusate sodium (STOOL SOFTENER) 100 MG capsule Take 1 capsule (100 mg total) by mouth 2 (two) times daily. Patient not taking: Reported on 07/21/2020 06/22/20   Mar Daring, PA-C  doxepin (SINEQUAN) 10 MG capsule Take 3 capsules (30 mg total) by mouth at bedtime. 06/22/20   Mar Daring, PA-C  feeding supplement, ENSURE ENLIVE, (ENSURE ENLIVE) LIQD Take 237 mLs by mouth 2 (two) times daily between meals. 06/16/20   Hongalgi, Lenis Dickinson, MD  fluticasone (FLONASE) 50 MCG/ACT nasal spray Place into both nostrils. 06/09/20   [provider]  Fluticasone-Umeclidin-Vilant (TRELEGY ELLIPTA) 100-62.5-25 MCG/INH AEPB Inhale 1 puff into the lungs daily. 06/22/20   Mar Daring, PA-C  nystatin (MYCOSTATIN) 100000 UNIT/ML suspension TAKE 5 MLS BY MOUTH 4 TIMES A DAY. 06/22/20   Mar Daring, PA-C  oxybutynin (DITROPAN XL) 15 MG 24 hr tablet  Take 1 tablet (15 mg total) by mouth at bedtime. 06/22/20   Mar Daring, PA-C  pentosan polysulfate (ELMIRON) 100 MG capsule Take 1 capsule (100 mg total) by mouth in the morning and at bedtime. 06/22/20   Mar Daring, PA-C  ULTRAM 50 MG tablet Take 1 tablet (50 mg total) by mouth every 6 (six) hours as needed. 06/02/20   Mar Daring, PA-C  Urea 40 % LOTN Apply topically. 06/22/20   Mar Daring, PA-C  VENTOLIN HFA 108 (90 Base) MCG/ACT inhaler INHALE TWO PUFFS INTO THE LUNGS EVERY 4 HOURS AS NEEDED FOR WHEEZING OR SHORTNESS OF BREATH 06/22/20   Mar Daring, PA-C      VITAL SIGNS:  Blood pressure (!) 118/57, pulse  67, temperature 98.7 F (37.1 C), temperature source Oral, resp. rate (!) 21, height 4\' 10"  (1.473 m), weight 54 kg, SpO2 99 %.  PHYSICAL EXAMINATION:  Physical Exam  GENERAL:  84 y.o.-year-old Caucasian female patient lying in the bed with no acute distress.  EYES: Pupils equal, round, reactive to light and accommodation.  Positive pallor.  No scleral icterus. Extraocular muscles intact.  HEENT: Head atraumatic, normocephalic. Oropharynx and nasopharynx clear.  NECK:  Supple, no jugular venous distention. No thyroid enlargement, no tenderness.  LUNGS: Normal breath sounds bilaterally, no wheezing, rales,rhonchi or crepitation. No use of accessory muscles of respiration.  CARDIOVASCULAR: Regular rate and rhythm, S1, S2 normal. No murmurs, rubs, or gallops.  ABDOMEN: Soft, nondistended, nontender. Bowel sounds present. No organomegaly or mass.  EXTREMITIES: No pedal edema, cyanosis, or clubbing.  NEUROLOGIC: Cranial nerves II through XII are intact. Muscle strength 5/5 in all extremities. Sensation intact. Gait not checked.  PSYCHIATRIC: The patient is alert and oriented x 3.  Normal affect and good eye contact. SKIN: No obvious rash, lesion, or ulcer.   LABORATORY PANEL:   CBC Recent Labs  Lab 08/04/20 1726  WBC 8.5  HGB 6.3*  HCT 21.5*    PLT 323   ------------------------------------------------------------------------------------------------------------------  Chemistries  Recent Labs  Lab 08/04/20 1726  NA 140  K 4.5  CL 99  CO2 32  GLUCOSE 172*  BUN 27*  CREATININE 0.69  CALCIUM 9.1  AST 20  ALT 14  ALKPHOS 61  BILITOT 0.6   ------------------------------------------------------------------------------------------------------------------  Cardiac Enzymes No results for input(s): TROPONINI in the last 168 hours. ------------------------------------------------------------------------------------------------------------------  RADIOLOGY:  No results found.    IMPRESSION AND PLAN:   1.  GI bleeding with subsequent acute blood loss anemia. -The patient will be admitted to a progressive unit bed. -We will follow serial hemoglobins and hematocrits. -She was typed and crossmatch and will be transfused 2 units of packed red blood cells that was started in the ER. -Will be placed on IV Protonix bolus and drip. -We will follow posttransfusion H&H. -Eliquis will be held off. -Routine GI consultation will be obtained. -I notified Dr. Bonna Gains about the patient.  2.  Atrial fibrillation with rapid ventricle response. -This like secondary to her anemia. -We will utilize as needed IV Lopressor for rate control. -Eliquis was held off given GI bleeding. -We will continue Cardizem CD.  3.  COPD. -She will be placed on as needed duo nebs.  4.  DVT prophylaxis. -SCDs. -Medical prophylaxis currently contraindicated due to GI bleeding.   All the records are reviewed and case discussed with ED provider. The plan of care was discussed in details with the patient (and family). I answered all questions. The patient agreed to proceed with the above mentioned plan. Further management will depend upon hospital course.   CODE STATUS: Full code  Status is: Inpatient  Remains inpatient appropriate  because:Ongoing diagnostic testing needed not appropriate for outpatient work up, Unsafe d/c plan, IV treatments appropriate due to intensity of illness or inability to take PO and Inpatient level of care appropriate due to severity of illness   Dispo: The patient is from: Home              Anticipated d/c is to: Home              Anticipated d/c date is: 2 days              Patient currently is not medically stable to d/c.  TOTAL TIME TAKING CARE OF THIS PATIENT: 55 minutes.    Christel Mormon M.D on 08/04/2020 at 10:45 PM  [facility]   From 7 PM-7 AM, contact night-coverage [email or web address]  CC: Primary care physician; Mar Daring, PA-C   Note: This dictation was prepared with Dragon dictation along with smaller phrase technology. Any transcriptional typo errors that result from this process are unintentional.

## 2020-08-04 NOTE — Telephone Encounter (Signed)
Patient has been advised. KW 

## 2020-08-04 NOTE — Telephone Encounter (Signed)
I spoke with patient in detail about need to go to ED for evaluation and blood transfusion as advised by PCP. Patient states that she did not want to go to ED due to long wait time, I advised patient that with her Hgb getting lower it is in her best interest to seek immediate medical care. I offered to contact EMS for patient if she did not have transportation, she declined me calling EMS. While we were on phone patient had fell into wall and states that she has bruised her arm and became tearful in fear that she would start bleeding. Patient states that she is going to call a few of her friends/acquantinces to see if she can get a ride this evening to ED. KW

## 2020-08-04 NOTE — Progress Notes (Signed)
New anemia; HgB 6.4, was 11 last month. Recently started on Eliquis 2.5mg  BID for new persistent atrial fibrillation. Hospitalized with resp failure and currently on 4L O2 continuous. SOB is stable and at baseline. Patient only complaint was fatigue. Patient is holding eliquis at this time.   Referrals to Hematology and GI placed.

## 2020-08-04 NOTE — Telephone Encounter (Signed)
Patient states that she takes eliquis in AM and PM she wants to know if you want her to start back up tonight or over weekend? Patient had concerns about how soon she would get in with hemaotlogy she reports this morning she was very weak and could not stand to get in shower. Please advise. KW

## 2020-08-04 NOTE — ED Provider Notes (Signed)
[facility] Emergency Department Provider Note  ____________________________________________   First MD Initiated Contact with Patient 08/04/20 2111     (approximate)  I have reviewed the triage vital signs and the nursing notes.   HISTORY  Chief Complaint Fatigue and Abnormal Lab    HPI Jackie Turner is a 84 y.o. female with recently diagnosed atrial fibrillation here with generalized weakness.  The patient states that she is here because her primary care doctor told her to come for admission and transfusion.  The patient was recently hospitalized and seen by her doctor, diagnosed with COPD with hypoxia as well as new onset atrial fibrillation.  She states that her primary complaint is she has general fatigue, which she attributes to her recent medication changes and illness.  Denies any specific complaints.  No recent fever, chills.  Denies any abdominal pain.  She does state that her stool has been darker than usual.  No history of anemia or transfusions in the past.  As mentioned, she recently started Eliquis.  Denies any emesis.  She did not take her Eliquis today.        Past Medical History:  Diagnosis Date  . Allergy   . Asthma   . Hyperlipidemia   . Osteoporosis     Patient Active Problem List   Diagnosis Date Noted  . GI bleeding 08/04/2020  . Depression, major, single episode, in partial remission ([place]) 06/22/2020  . Atrial fibrillation with rapid ventricular response ([place]) 06/13/2020  . Acute on suspect chronic respiratory failure with hypoxia ([place]) 06/13/2020  . Right hand pain 06/13/2020  . Fall at home, initial encounter 06/13/2020  . COPD with acute exacerbation ([place]) 06/13/2020  . Bronchiectasis without complication ([place]) 25/95/6387  . Lymphedema 05/17/2019  . Chronic venous insufficiency 05/17/2019  . Swelling of limb 02/10/2018  . Varicose veins of leg with swelling, bilateral 02/10/2018  . Squamous cell carcinoma  09/22/2017  . Hypoxia 09/27/2015  . Abnormal CXR 09/27/2015  . Asthma with allergic rhinitis 08/31/2015  . Back ache 08/31/2015  . Chronic interstitial cystitis 08/31/2015  . Grief reaction 08/31/2015  . Hyperlipidemia 06/26/2015  . Migraine 06/26/2015  . Asthma 06/26/2015  . IBS (irritable bowel syndrome) 06/26/2015  . Osteoporosis 06/26/2015  . Fatigue 06/26/2015  . Lumbar radiculopathy 04/27/2013  . Urinary urgency 01/08/2012  . Frequency of urination 01/08/2012    Past Surgical History:  Procedure Laterality Date  . ABDOMINAL HYSTERECTOMY  2003  . BREAST BIOPSY  1976, 1978   benign  . carpal tunnel repair Bilateral    R 1990; L 1997  . EYE SURGERY Bilateral    cataract extraction  . HAND TENDON SURGERY Right    Trigger finger  . IR RADIOLOGIST EVAL & MGMT  06/13/2017  . IR VERTEBROPLASTY CERV/THOR BX INC UNI/BIL INC/INJECT/IMAGING  06/17/2017  . TONSILLECTOMY  1961    Prior to Admission medications   Medication Sig Start Date End Date Taking? Authorizing Provider  apixaban (ELIQUIS) 2.5 MG TABS tablet Take 1 tablet (2.5 mg total) by mouth 2 (two) times daily. 06/22/20   Mar Daring, PA-C  calcium carbonate (CALTRATE 600) 1500 (600 Ca) MG TABS tablet Take by mouth 2 (two) times daily with a meal. 06/22/20   Burnette, Clearnce Sorrel, PA-C  CARDIZEM CD 120 MG 24 hr capsule Take 1 capsule (120 mg total) by mouth daily. Brand only 07/27/20   Mar Daring, PA-C  Clobetasol Propionate Emulsion 0.05 % topical foam Apply topically  2 (two) times daily. 06/22/20   Mar Daring, PA-C  diclofenac Sodium (VOLTAREN) 1 % GEL Apply 4 g topically 4 (four) times daily. 08/03/20   Mar Daring, PA-C  dicyclomine (BENTYL) 10 MG capsule Take 1 capsule (10 mg total) by mouth 3 (three) times daily as needed for spasms. 06/22/20   Mar Daring, PA-C  docusate sodium (STOOL SOFTENER) 100 MG capsule Take 1 capsule (100 mg total) by mouth 2 (two) times daily. Patient not  taking: Reported on 07/21/2020 06/22/20   Mar Daring, PA-C  doxepin (SINEQUAN) 10 MG capsule Take 3 capsules (30 mg total) by mouth at bedtime. 06/22/20   Mar Daring, PA-C  feeding supplement, ENSURE ENLIVE, (ENSURE ENLIVE) LIQD Take 237 mLs by mouth 2 (two) times daily between meals. 06/16/20   Hongalgi, Lenis Dickinson, MD  fluticasone (FLONASE) 50 MCG/ACT nasal spray Place into both nostrils. 06/09/20   [provider]  Fluticasone-Umeclidin-Vilant (TRELEGY ELLIPTA) 100-62.5-25 MCG/INH AEPB Inhale 1 puff into the lungs daily. 06/22/20   Mar Daring, PA-C  nystatin (MYCOSTATIN) 100000 UNIT/ML suspension TAKE 5 MLS BY MOUTH 4 TIMES A DAY. 06/22/20   Mar Daring, PA-C  oxybutynin (DITROPAN XL) 15 MG 24 hr tablet Take 1 tablet (15 mg total) by mouth at bedtime. 06/22/20   Mar Daring, PA-C  pentosan polysulfate (ELMIRON) 100 MG capsule Take 1 capsule (100 mg total) by mouth in the morning and at bedtime. 06/22/20   Mar Daring, PA-C  ULTRAM 50 MG tablet Take 1 tablet (50 mg total) by mouth every 6 (six) hours as needed. 06/02/20   Mar Daring, PA-C  Urea 40 % LOTN Apply topically. 06/22/20   Mar Daring, PA-C  VENTOLIN HFA 108 (90 Base) MCG/ACT inhaler INHALE TWO PUFFS INTO THE LUNGS EVERY 4 HOURS AS NEEDED FOR WHEEZING OR SHORTNESS OF BREATH 06/22/20   Mar Daring, PA-C    Allergies Nsaids; Amoxicillin; Ibandronic acid; Actonel  [risedronate]; Antihistamines, chlorpheniramine-type; Aspirin; Buspar [buspirone]; Butalbital-aspirin-caffeine; Clarithromycin; Codeine; Gatifloxacin; Hydrocodone-guaifenesin; Moxifloxacin; Oxycodone; Penicillins; Risedronate sodium; Tylenol with codeine #3  [acetaminophen-codeine]; and Raloxifene  Family History  Problem Relation Age of Onset  . Heart disease Mother   . CAD Mother   . Congestive Heart Failure Mother   . Osteoarthritis Mother   . Cancer Sister        breast  . Breast cancer Sister      Social History Social History   Tobacco Use  . Smoking status: Never Smoker  . Smokeless tobacco: Never Used  Vaping Use  . Vaping Use: Never used  Substance Use Topics  . Alcohol use: No  . Drug use: No    Review of Systems  Review of Systems  Constitutional: Positive for fatigue. Negative for fever.  HENT: Negative for congestion and sore throat.   Eyes: Negative for visual disturbance.  Respiratory: Negative for cough and shortness of breath.   Cardiovascular: Negative for chest pain.  Gastrointestinal: Negative for abdominal pain, diarrhea, nausea and vomiting.  Genitourinary: Negative for flank pain.  Musculoskeletal: Negative for back pain and neck pain.  Skin: Negative for rash and wound.  Neurological: Positive for weakness.     ____________________________________________  PHYSICAL EXAM:      VITAL SIGNS: ED Triage Vitals [08/04/20 1723]  Enc Vitals Group     BP (!) 145/87     Pulse Rate (!) 122     Resp (!) 25     Temp 100.2  F (37.9 C)     Temp Source Oral     SpO2 99 %     Weight 119 lb (54 kg)     Height 4\' 10"  (1.473 m)     Head Circumference      Peak Flow      Pain Score 0     Pain Loc      Pain Edu?      Excl. in [place]?      Physical Exam Vitals and nursing note reviewed.  Constitutional:      General: She is not in acute distress.    Appearance: She is well-developed.  HENT:     Head: Normocephalic and atraumatic.  Eyes:     Conjunctiva/sclera: Conjunctivae normal.  Cardiovascular:     Rate and Rhythm: Normal rate and regular rhythm.     Heart sounds: Normal heart sounds. No murmur heard.  No friction rub.  Pulmonary:     Effort: Pulmonary effort is normal. No respiratory distress.     Breath sounds: Normal breath sounds. No wheezing or rales.  Abdominal:     General: There is no distension.     Palpations: Abdomen is soft.     Tenderness: There is no abdominal tenderness.  Musculoskeletal:     Cervical back: Neck supple.   Skin:    General: Skin is warm.     Capillary Refill: Capillary refill takes less than 2 seconds.  Neurological:     Mental Status: She is alert and oriented to person, place, and time.     Motor: No abnormal muscle tone.       ____________________________________________   LABS (all labs ordered are listed, but only abnormal results are displayed)  Labs Reviewed  COMPREHENSIVE METABOLIC PANEL - Abnormal; Notable for the following components:      Result Value   Glucose, Bld 172 (*)    BUN 27 (*)    All other components within normal limits  CBC - Abnormal; Notable for the following components:   RBC 2.77 (*)    Hemoglobin 6.3 (*)    HCT 21.5 (*)    MCV 77.6 (*)    MCH 22.7 (*)    MCHC 29.3 (*)    RDW 17.2 (*)    nRBC 0.4 (*)    All other components within normal limits  SARS CORONAVIRUS 2 BY RT PCR (HOSPITAL ORDER, [facility])  PROTIME-INR  OCCULT BLOOD X 1 CARD TO LAB, STOOL  BASIC METABOLIC PANEL  CBC  HEMOGLOBIN AND HEMATOCRIT, BLOOD  HEMOGLOBIN AND HEMATOCRIT, BLOOD  HEMOGLOBIN AND HEMATOCRIT, BLOOD  HEMOGLOBIN AND HEMATOCRIT, BLOOD  TYPE AND SCREEN  PREPARE RBC (CROSSMATCH)  ABO/RH  PREPARE RBC (CROSSMATCH)  PREPARE RBC (CROSSMATCH)    ____________________________________________  EKG: Atrial fibrillation, ventricular 104.  QRS 70, QTc 412.  No acute ST elevations or depressions.  No EKG evidence of acute ischemia or infarct. ________________________________________  RADIOLOGY All imaging, including plain films, CT scans, and ultrasounds, independently reviewed by me, and interpretations confirmed via formal radiology reads.  ED MD interpretation:   None  Official radiology report(s): No results found.  ____________________________________________  PROCEDURES   Procedure(s) performed (including Critical Care):  Procedures  ____________________________________________  INITIAL IMPRESSION / MDM / [place] / ED COURSE  As part of my medical decision making, I reviewed the following data within the [place] notes reviewed and incorporated, Old chart reviewed, Notes from prior ED  visits, and [place] Controlled Substance Database       *Jackie Turner was evaluated in Emergency Department on 08/05/2020 for the symptoms described in the history of present illness. She was evaluated in the context of the global COVID-19 pandemic, which necessitated consideration that the patient might be at risk for infection with the SARS-CoV-2 virus that causes COVID-19. Institutional protocols and algorithms that pertain to the evaluation of patients at risk for COVID-19 are in a state of rapid change based on information released by regulatory bodies including the CDC and federal and state organizations. These policies and algorithms were followed during the patient's care in the ED.  Some ED evaluations and interventions may be delayed as a result of limited staffing during the pandemic.*     Medical Decision Making:  84 yo F here with mild symptomatic anemia. Suspect related to UGIB in setting of new anticoagulation. Hgb is 6.3, with symptoms of lightheadedness and dizziness. Pt reports intermittent black stools c/f GI source. No active bleeding clinically. Will transfuse, admit for observation, possible Gi evaluation. No abd pain, do not feel imaging indicated at this time.  ____________________________________________  FINAL CLINICAL IMPRESSION(S) / ED DIAGNOSES  Final diagnoses:  Blood loss anemia     MEDICATIONS GIVEN DURING THIS VISIT:  Medications  traMADol (ULTRAM) tablet 50 mg (50 mg Oral Given 08/05/20 0206)  diltiazem (CARDIZEM CD) 24 hr capsule 120 mg (has no administration in time range)  doxepin (SINEQUAN) capsule 30 mg ( Oral Canceled Entry 08/05/20 0135)  dicyclomine (BENTYL) capsule 10 mg (has no administration in time range)  oxybutynin (DITROPAN-XL) 24 hr  tablet 15 mg ( Oral Canceled Entry 08/05/20 0136)  pentosan polysulfate (ELMIRON) capsule 100 mg (100 mg Oral Not Given 08/04/20 2336)  Fluticasone-Umeclidin-Vilant 100-62.5-25 MCG/INH AEPB 1 puff (has no administration in time range)  albuterol (PROVENTIL) (2.5 MG/3ML) 0.083% nebulizer solution 3 mL (has no administration in time range)  Clobetasol Propionate Emulsion 0.05 % ( Topical Not Given 08/04/20 2335)  0.9 %  sodium chloride infusion (has no administration in time range)  acetaminophen (TYLENOL) tablet 650 mg (has no administration in time range)    Or  acetaminophen (TYLENOL) suppository 650 mg (has no administration in time range)  traZODone (DESYREL) tablet 25 mg (25 mg Oral Given 08/05/20 0206)  ondansetron (ZOFRAN) tablet 4 mg (has no administration in time range)    Or  ondansetron (ZOFRAN) injection 4 mg (has no administration in time range)  pantoprazole (PROTONIX) 80 mg in sodium chloride 0.9 % 100 mL IVPB ( Intravenous Canceled Entry 08/05/20 0136)  pantoprazole (PROTONIX) 80 mg in sodium chloride 0.9 % 100 mL (0.8 mg/mL) infusion ( Intravenous Canceled Entry 08/05/20 0136)  pantoprazole (PROTONIX) injection 40 mg (has no administration in time range)  0.9 %  sodium chloride infusion (has no administration in time range)  ipratropium-albuterol (DUONEB) 0.5-2.5 (3) MG/3ML nebulizer solution 3 mL (has no administration in time range)  0.9 %  sodium chloride infusion (10 mL/hr Intravenous New Bag/Given 08/04/20 2325)  pantoprazole (PROTONIX) injection 40 mg (40 mg Intravenous Given 08/04/20 2249)  0.9 %  sodium chloride infusion (Manually program via Guardrails IV Fluids) ( Intravenous New Bag/Given 08/05/20 0211)     ED Discharge Orders    None       Note:  This document was prepared using Dragon voice recognition software and may include unintentional dictation errors.   Duffy Bruce, MD 08/05/20 [phone]

## 2020-08-04 NOTE — Telephone Encounter (Signed)
I tried speaking with patient and advised her of below patient seemed frantic on the phone she states that she just spoke with her caregiver who told her that if she stopped Eliquis she will die. Patient was very panicked on the phone stating that her caregiver was driving to her home now to rush her to ER and patient states that she wants to know from [place] what will happen to her if she stops Eliquis. KW

## 2020-08-04 NOTE — Telephone Encounter (Signed)
I am hoping to get her in on Monday.   I want her to hold eliquis all weekend until further advised.   If she is getting weaker or having any worsening of her breathing or chest pain she should seek emergent medical treatment at the ER as I had discussed with her this morning. She could check her HR on her pulse oximeter machine and if over 120 should go to the ER.

## 2020-08-04 NOTE — Telephone Encounter (Signed)
Copied from [place] [phone]. Topic: General - Inquiry >> Aug 04, 2020  1:30 PM Gillis Ends D wrote: Reason for CRM: Patient called and wants to know if she needs to take her eliquis for this morning or not. She takes it bid and she hasn't taken it this morning. She spoke with Dr. Marlyn Corporal this morning but failed to ask about the eliquis.

## 2020-08-04 NOTE — ED Notes (Signed)
Report given to April B. RN and informed that blood unit was ready.

## 2020-08-04 NOTE — Telephone Encounter (Signed)
No I do not want her to take it.   I have sent a message to Dr. Saralyn Pilar but he is in the cath lab today so may not get it until later.   I have placed urgent referrals to hematology and GI so they will be contacting to schedule these appointments.

## 2020-08-04 NOTE — ED Triage Notes (Signed)
Pt presents to ED via POV with c/o low Hgb. Pt states takes eliquis and was told by PCP that it had been "eating through her blood". Pt states has appt with hematologist on Monday.   Pt states normally on chornic 4L. Noted to be unable to sit still in triage, pt states is baseline for her.   Per results review pt's Hgb yesterday 6.4 at PCP.

## 2020-08-04 NOTE — Telephone Encounter (Signed)
Currently it seems the patient may have possible blood loss from the Eliquis. At this time it is more risky for her to continue the eliquis until we know more. If someone is taking her to the ER that is the best. That is what I was wanting her to do this morning when we talked. With going to the ER we will get quicker answers and she will feel better as well once she gets a blood transfusion.

## 2020-08-05 DIAGNOSIS — K922 Gastrointestinal hemorrhage, unspecified: Secondary | ICD-10-CM

## 2020-08-05 LAB — BASIC METABOLIC PANEL
Anion gap: 10 (ref 5–15)
BUN: 21 mg/dL (ref 8–23)
CO2: 33 mmol/L — ABNORMAL HIGH (ref 22–32)
Calcium: 8.6 mg/dL — ABNORMAL LOW (ref 8.9–10.3)
Chloride: 97 mmol/L — ABNORMAL LOW (ref 98–111)
Creatinine, Ser: 0.62 mg/dL (ref 0.44–1.00)
GFR calc Af Amer: >60 mL/min (ref >60)
GFR calc non Af Amer: >60 mL/min (ref >60)
Glucose, Bld: 96 mg/dL (ref 70–99)
Potassium: 4.9 mmol/L (ref 3.5–5.1)
Sodium: 140 mmol/L (ref 135–145)

## 2020-08-05 LAB — HEMOGLOBIN AND HEMATOCRIT, BLOOD
HCT: 30.3 % — ABNORMAL LOW (ref 36.0–46.0)
Hemoglobin: 9.1 g/dL — ABNORMAL LOW (ref 12.0–15.0)

## 2020-08-05 LAB — CBC
HCT: 31.8 % — ABNORMAL LOW (ref 36.0–46.0)
Hemoglobin: 9.4 g/dL — ABNORMAL LOW (ref 12.0–15.0)
MCH: 23.3 pg — ABNORMAL LOW (ref 26.0–34.0)
MCHC: 29.6 g/dL — ABNORMAL LOW (ref 30.0–36.0)
MCV: 78.9 fL — ABNORMAL LOW (ref 80.0–100.0)
Platelets: 272 10*3/uL (ref 150–400)
RBC: 4.03 MIL/uL (ref 3.87–5.11)
RDW: 17.4 % — ABNORMAL HIGH (ref 11.5–15.5)
WBC: 8.4 10*3/uL (ref 4.0–10.5)
nRBC: 0.6 % — ABNORMAL HIGH (ref 0.0–0.2)

## 2020-08-05 LAB — PREPARE RBC (CROSSMATCH)

## 2020-08-05 LAB — SARS CORONAVIRUS 2 BY RT PCR (HOSPITAL ORDER, PERFORMED IN ~~LOC~~ HOSPITAL LAB): SARS Coronavirus 2: NEGATIVE

## 2020-08-05 LAB — GLUCOSE, CAPILLARY: Glucose-Capillary: 103 mg/dL — ABNORMAL HIGH (ref 70–99)

## 2020-08-05 MED ORDER — SODIUM CHLORIDE 0.9% IV SOLUTION
Freq: Once | INTRAVENOUS | Status: AC
  Filled 2020-08-05: qty 250

## 2020-08-05 MED ORDER — UMECLIDINIUM BROMIDE 62.5 MCG/INH IN AEPB
1.0000 | INHALATION_SPRAY | Freq: Every day | RESPIRATORY_TRACT | Status: DC
  Administered 2020-08-05 – 2020-08-15 (×9): 1 via RESPIRATORY_TRACT
  Filled 2020-08-05 (×3): qty 7

## 2020-08-05 MED ORDER — FLUTICASONE FUROATE-VILANTEROL 100-25 MCG/INH IN AEPB
1.0000 | INHALATION_SPRAY | Freq: Every day | RESPIRATORY_TRACT | Status: DC
  Administered 2020-08-05 – 2020-08-15 (×9): 1 via RESPIRATORY_TRACT
  Filled 2020-08-05 (×2): qty 28

## 2020-08-05 MED ORDER — IPRATROPIUM-ALBUTEROL 0.5-2.5 (3) MG/3ML IN SOLN: 3.0000 mL | RESPIRATORY_TRACT | Status: DC | PRN

## 2020-08-05 MED ORDER — SODIUM CHLORIDE 0.9 % IV SOLN
10.0000 mL/h | Freq: Once | INTRAVENOUS | Status: AC
  Administered 2020-08-05: 10 mL/h via INTRAVENOUS

## 2020-08-05 NOTE — ED Notes (Signed)
Pt assisted to bathroom. NAD. Unlabored.

## 2020-08-05 NOTE — ED Notes (Signed)
Meal tray ordered for pt  

## 2020-08-05 NOTE — ED Notes (Signed)
Went to call report, in protected time.  Informed them if have questions to call since this RN has had patient all day.  Otherwise pt will come up at 730 pm

## 2020-08-05 NOTE — ED Notes (Signed)
Labs drawn and sent -

## 2020-08-05 NOTE — ED Notes (Signed)
Assisted to bathroom by [facility] RN

## 2020-08-05 NOTE — ED Notes (Signed)
Pt assisted to bathroom

## 2020-08-05 NOTE — ED Notes (Signed)
Pt eating dinner tray of clears. NAD. No other needs.

## 2020-08-05 NOTE — Consult Note (Signed)
Vonda Antigua, MD [address] [place], [place], [place], [address] [id number], [place], [place], [place], [address] Phone: [phone]  Fax: [phone]  Consultation  Referring Provider:     Dr. Posey Pronto Primary Care Physician:  Mar Daring, PA-C Reason for Consultation:    Melena  Date of Admission:  08/04/2020 Date of Consultation:  08/05/2020         HPI:   Jackie Turner is a 84 y.o. female with history of A. fib on Eliquis, last use yesterday morning, presented with melena and anemia.  Patient denies any abdominal pain, nausea or vomiting or hematemesis.  No prior history of GI bleed.  Hemoglobin 6.3 on admission.  Also found to be in A. fib with RVR.  No recent GI procedures.  Past Medical History:  Diagnosis Date  . Allergy   . Asthma   . Hyperlipidemia   . Osteoporosis     Past Surgical History:  Procedure Laterality Date  . ABDOMINAL HYSTERECTOMY  2003  . BREAST BIOPSY  1976, 1978   benign  . carpal tunnel repair Bilateral    R 1990; L 1997  . EYE SURGERY Bilateral    cataract extraction  . HAND TENDON SURGERY Right    Trigger finger  . IR RADIOLOGIST EVAL & MGMT  06/13/2017  . IR VERTEBROPLASTY CERV/THOR BX INC UNI/BIL INC/INJECT/IMAGING  06/17/2017  . TONSILLECTOMY  1961    Prior to Admission medications   Medication Sig Start Date End Date Taking? Authorizing Provider  apixaban (ELIQUIS) 2.5 MG TABS tablet Take 1 tablet (2.5 mg total) by mouth 2 (two) times daily. 06/22/20   Mar Daring, PA-C  calcium carbonate (CALTRATE 600) 1500 (600 Ca) MG TABS tablet Take by mouth 2 (two) times daily with a meal. 06/22/20   Burnette, Clearnce Sorrel, PA-C  CARDIZEM CD 120 MG 24 hr capsule Take 1 capsule (120 mg total) by mouth daily. Brand only 07/27/20   Mar Daring, PA-C  Clobetasol Propionate Emulsion 0.05 % topical foam Apply topically 2 (two) times daily. 06/22/20   Mar Daring, PA-C  diclofenac Sodium (VOLTAREN) 1 % GEL  Apply 4 g topically 4 (four) times daily. 08/03/20   Mar Daring, PA-C  dicyclomine (BENTYL) 10 MG capsule Take 1 capsule (10 mg total) by mouth 3 (three) times daily as needed for spasms. 06/22/20   Mar Daring, PA-C  doxepin (SINEQUAN) 10 MG capsule Take 3 capsules (30 mg total) by mouth at bedtime. 06/22/20   Mar Daring, PA-C  feeding supplement, ENSURE ENLIVE, (ENSURE ENLIVE) LIQD Take 237 mLs by mouth 2 (two) times daily between meals. 06/16/20   Hongalgi, Lenis Dickinson, MD  fluticasone (FLONASE) 50 MCG/ACT nasal spray Place into both nostrils. 06/09/20   [provider]  Fluticasone-Umeclidin-Vilant (TRELEGY ELLIPTA) 100-62.5-25 MCG/INH AEPB Inhale 1 puff into the lungs daily. 06/22/20   Mar Daring, PA-C  nystatin (MYCOSTATIN) 100000 UNIT/ML suspension TAKE 5 MLS BY MOUTH 4 TIMES A DAY. 06/22/20   Mar Daring, PA-C  oxybutynin (DITROPAN XL) 15 MG 24 hr tablet Take 1 tablet (15 mg total) by mouth at bedtime. 06/22/20   Mar Daring, PA-C  pentosan polysulfate (ELMIRON) 100 MG capsule Take 1 capsule (100 mg total) by mouth in the morning and at bedtime. 06/22/20   Mar Daring, PA-C  ULTRAM 50 MG tablet Take 1 tablet (50 mg total) by mouth every 6 (six) hours as needed. 06/02/20  Fenton Malling M, PA-C  Urea 40 % LOTN Apply topically. 06/22/20   Mar Daring, PA-C  VENTOLIN HFA 108 (90 Base) MCG/ACT inhaler INHALE TWO PUFFS INTO THE LUNGS EVERY 4 HOURS AS NEEDED FOR WHEEZING OR SHORTNESS OF BREATH 06/22/20   Mar Daring, PA-C    Family History  Problem Relation Age of Onset  . Heart disease Mother   . CAD Mother   . Congestive Heart Failure Mother   . Osteoarthritis Mother   . Cancer Sister        breast  . Breast cancer Sister      Social History   Tobacco Use  . Smoking status: Never Smoker  . Smokeless tobacco: Never Used  Vaping Use  . Vaping Use: Never used  Substance Use Topics  . Alcohol use: No  . Drug  use: No    Allergies as of 08/04/2020 - Review Complete 08/04/2020  Allergen Reaction Noted  . Nsaids Other (See Comments) 06/26/2015  . Amoxicillin Nausea Only and Rash 06/26/2015  . Ibandronic acid Other (See Comments) 06/26/2015  . Actonel  [risedronate] Other (See Comments) 08/31/2015  . Antihistamines, chlorpheniramine-type  08/31/2015  . Aspirin  08/31/2015  . Buspar [buspirone] Other (See Comments) 06/16/2017  . Butalbital-aspirin-caffeine Other (See Comments) 06/27/2015  . Clarithromycin Other (See Comments) 06/26/2015  . Codeine  08/31/2015  . Gatifloxacin Other (See Comments) 08/31/2015  . Hydrocodone-guaifenesin Nausea Only 06/16/2017  . Moxifloxacin  08/31/2015  . Oxycodone Other (See Comments) 06/16/2017  . Penicillins Other (See Comments) 08/31/2015  . Risedronate sodium  08/31/2015  . Tylenol with codeine #3  [acetaminophen-codeine]  06/27/2015  . Raloxifene Other (See Comments) 06/26/2015    Review of Systems:    All systems reviewed and negative except where noted in HPI.   Physical Exam:  Vital signs in last 24 hours: Vitals:   08/05/20 0730 08/05/20 1200 08/05/20 1230 08/05/20 1309  BP: (!) 144/70 (!) 127/103 131/87 129/83  Pulse: 95 (!) 131    Resp: (!) 21 (!) 22 20 18   Temp:      TempSrc:      SpO2: 99% 94% 93% 97%  Weight:      Height:         General:   Pleasant, cooperative in NAD Head:  Normocephalic and atraumatic. Eyes:   No icterus.   Conjunctiva pink. PERRLA. Ears:  Normal auditory acuity. Neck:  Supple; no masses or thyroidomegaly Lungs: Respirations even and unlabored. Lungs clear to auscultation bilaterally.   No wheezes, crackles, or rhonchi.  Abdomen:  Soft, nondistended, nontender. Normal bowel sounds. No appreciable masses or hepatomegaly.  No rebound or guarding.  Neurologic:  Alert and oriented x3;  grossly normal neurologically. Skin:  Intact without significant lesions or rashes. Cervical Nodes:  No significant cervical  adenopathy. Psych:  Alert and cooperative. Normal affect.  LAB RESULTS: Recent Labs    08/03/20 1404 08/04/20 1726 08/05/20 1243  WBC 7.2 8.5 8.4  HGB 6.4* 6.3* 9.4*  HCT 22.1* 21.5* 31.8*  PLT 338 323 272   BMET Recent Labs    08/03/20 1404 08/04/20 1726 08/05/20 1243  NA 142 140 140  K 4.9 4.5 4.9  CL 100 99 97*  CO2 29 32 33*  GLUCOSE 158* 172* 96  BUN 21 27* 21  CREATININE 0.73 0.69 0.62  CALCIUM 9.3 9.1 8.6*   LFT Recent Labs    08/04/20 1726  PROT 6.5  ALBUMIN 3.7  AST 20  ALT 14  ALKPHOS 61  BILITOT 0.6   PT/INR Recent Labs    08/04/20 1726  LABPROT 13.6  INR 1.1    STUDIES: No results found.    Impression / Plan:   Jackie Turner is a 84 y.o. y/o female with history of A. fib on Eliquis, last use yesterday, presented with melena and anemia  Possible upper GI bleed from ulcers versus AVM versus esophagitis.  Patient is on Voltaren gel as an outpatient, and this can also lead to GI ulcers.  Patient will need upper endoscopy during this admission, however Eliquis will need to be held for 2 days prior to endoscopy  Plan endoscopy on 08/07/2020, however if patient continues to have melena or hemoglobin does not respond appropriately to transfusion, upper endoscopy diagnostically can be considered prior to that depending on clinical status.  At this time hemoglobin is responding well and has improved to 9.4 post PRBC transfusion.  If symptoms occur prior to 08/07/2020, RBC scan may need to be considered  PPI IV twice daily  Continue serial CBCs and transfuse PRN Avoid NSAIDs Maintain 2 large-bore IV lines Please page GI with any acute hemodynamic changes, or signs of active GI bleeding  Please medically optimize patient at this time as patient's heart rate is still elevated.  IV fluid and PRBC transfusion and resuscitation as necessary.    Thank you for involving me in the care of this patient.      LOS: 1 day   Virgel Manifold,  MD  08/05/2020, 1:55 PM

## 2020-08-05 NOTE — ED Notes (Signed)
Phlebotomy at bedside for lab draw. 

## 2020-08-05 NOTE — ED Notes (Signed)
2A notified patient coming to the floor, report already called by previous shift RN

## 2020-08-05 NOTE — ED Notes (Signed)
Meal tray given to pt.  Offered another blanket, declined.

## 2020-08-05 NOTE — Progress Notes (Signed)
PROGRESS NOTE    Jackie Turner  YYQ:[id number] DOB: October 10, 1936 DOA: 08/04/2020 PCP: Mar Daring, PA-C (Confirm with patient/family/NH records and if not entered, this HAS to be entered at [facility] point of entry. "No PCP" if truly none.)   Brief Narrative:  Jackie Turner  is a 84 y.o. Caucasian female with a known history of asthma, dyslipidemia, atrial fibrillation on Eliquis and osteoporosis, who presented to the emergency room with acute onset of likely recurrent melena that she describes as dark stools.  She was started on p.o. Eliquis for atrial fibrillation in early July.  She denies any nausea or vomiting or heartburn or abdominal pain.  She admitted however to dry heaves which has been intermittent lately.  No bright red bleeding per rectum.  No other bleeding diathesis.  No chest pain or dyspnea or cough or wheezing or hemoptysis.  No dysuria, oliguria or hematuria or flank pain.Marland Kitchen  Upon presentation to the emergency room, temperature was 100.2 respiratory rate 25 and blood pressure 145/87 with pulse 79 9% on 4 L of O2 by nasal cannula.  Labs revealed a glucose of 172 and otherwise unremarkable CMP.  CBC showed hemoglobin of 6.3 hematocrit 21.5 compared to 6.4 and 22.1 yesterday and down from 11.4 and 35.8 on 06/22/2020. EKG showed atrial fibrillation with rapid ventricular response of 104.  She was given 40 mg of IV Protonix in the ER.  She will be admitted to a progressive unit bed for further evaluation and management.  Assessment & Plan:  1.  GI bleeding with subsequent acute blood loss anemia. -The patient will be admitted to a progressive unit bed. -We will follow serial hemoglobins and hematocrits. -She was typed and crossmatch and will be transfused 2 units of packed red blood cells that was started in the ER. -Will be placed on IV Protonix bolus and drip. -We will follow posttransfusion H&H. -Eliquis will be held off. -Routine GI consultation will be obtained. -I  notified Dr. Bonna Gains about the patient.  2.  Atrial fibrillation with rapid ventricle response. -This like secondary to her anemia. -We will utilize as needed IV Lopressor for rate control. -Eliquis was held off given GI bleeding. -We will continue Cardizem CD.  3.  COPD. -She will be placed on as needed duo nebs.  4.  DVT prophylaxis. -SCDs. -Medical prophylaxis currently contraindicated due to GI bleeding.   Consultants:  GI Consult-Dr.Tahliani    Subjective: Pt seen today and her vitals are stable on current regimen of diltiazem/ ivv ppi.h/h is 9.1  Objective: Vitals:   08/05/20 1330 08/05/20 1600 08/05/20 1700 08/05/20 1800  BP: (!) 112/103 (!) 123/52 125/74 134/81  Pulse: (!) 113 91 98 96  Resp: (!) 27 (!) 25 (!) 22 (!) 21  Temp:  98.5 F (36.9 C)    TempSrc:  Oral    SpO2: 97% 93% 97% 95%  Weight:      Height:       No intake or output data in the 24 hours ending 08/05/20 1817 Filed Weights   08/04/20 1723  Weight: 54 kg    Examination: Blood pressure 134/81, pulse 96, temperature 98.5 F (36.9 C), temperature source Oral, resp. rate (!) 21, height 4\' 10"  (1.473 m), weight 54 kg, SpO2 95 %. General exam: Appears calm and comfortable  Respiratory system: Clear to auscultation. Respiratory effort normal. Cardiovascular system: S1 & S2 heard, RRR. No JVD, murmurs, rubs, gallops or clicks. No pedal edema. Gastrointestinal system: Abdomen is nondistended, soft  and nontender. No organomegaly or masses felt. Normal bowel sounds heard. Central nervous system: Alert and oriented. No focal neurological deficits. Extremities: Symmetric 5 x 5 power. Skin: No rashes, lesions or ulcers Psychiatry: Judgement and insight appear normal. Mood & affect appropriate.   Data Reviewed: I have personally reviewed following labs and imaging studies  CBC: Recent Labs  Lab 08/03/20 1404 08/04/20 1726 08/05/20 1243 08/05/20 1637  WBC 7.2 8.5 8.4  --   NEUTROABS 5.0  --    --   --   HGB 6.4* 6.3* 9.4* 9.1*  HCT 22.1* 21.5* 31.8* 30.3*  MCV 76* 77.6* 78.9*  --   PLT 338 323 272  --    Basic Metabolic Panel: Recent Labs  Lab 08/03/20 1404 08/04/20 1726 08/05/20 1243  NA 142 140 140  K 4.9 4.5 4.9  CL 100 99 97*  CO2 29 32 33*  GLUCOSE 158* 172* 96  BUN 21 27* 21  CREATININE 0.73 0.69 0.62  CALCIUM 9.3 9.1 8.6*   GFR: Estimated Creatinine Clearance: 39.5 mL/min (by C-G formula based on SCr of 0.62 mg/dL). Liver Function Tests: Recent Labs  Lab 08/03/20 1404 08/04/20 1726  AST 17 20  ALT 10 14  ALKPHOS 75 61  BILITOT 0.2 0.6  PROT 6.0 6.5  ALBUMIN 3.9 3.7   No results for input(s): LIPASE, AMYLASE in the last 168 hours. No results for input(s): AMMONIA in the last 168 hours. Coagulation Profile: Recent Labs  Lab 08/04/20 1726  INR 1.1   Thyroid Function Tests: Recent Labs    08/03/20 1404  TSH 2.850   Anemia Panel: Recent Labs    08/03/20 1404  VITAMINB12 222*  FOLATE >20.0   Sepsis Labs: No results for input(s): PROCALCITON, LATICACIDVEN in the last 168 hours.  Recent Results (from the past 240 hour(s))  SARS Coronavirus 2 by RT PCR (hospital order, performed in [facility] hospital lab) Nasopharyngeal Nasopharyngeal Swab     Status: None   Collection Time: 08/04/20 10:53 PM   Specimen: Nasopharyngeal Swab  Result Value Ref Range Status   SARS Coronavirus 2 NEGATIVE NEGATIVE Final    Comment: (NOTE) SARS-CoV-2 target nucleic acids are NOT DETECTED.  The SARS-CoV-2 RNA is generally detectable in upper and lower respiratory specimens during the acute phase of infection. The lowest concentration of SARS-CoV-2 viral copies this assay can detect is 250 copies / mL. A negative result does not preclude SARS-CoV-2 infection and should not be used as the sole basis for treatment or other patient management decisions.  A negative result may occur with improper specimen collection / handling, submission of specimen  other than nasopharyngeal swab, presence of viral mutation(s) within the areas targeted by this assay, and inadequate number of viral copies (<250 copies / mL). A negative result must be combined with clinical observations, patient history, and epidemiological information.  Fact Sheet for Patients:   StrictlyIdeas.no  Fact Sheet for Healthcare Providers: BankingDealers.co.za  This test is not yet approved or  cleared by the [place] FDA and has been authorized for detection and/or diagnosis of SARS-CoV-2 by FDA under an Emergency Use Authorization (EUA).  This EUA will remain in effect (meaning this test can be used) for the duration of the COVID-19 declaration under Section 564(b)(1) of the Act, 21 U.S.C. section 360bbb-3(b)(1), unless the authorization is terminated or revoked sooner.  Performed at [facility], [address] [place],  [address]      Radiology Studies: No results found.  Scheduled Meds: . diltiazem  120 mg Oral Daily  . doxepin  30 mg Oral QHS  . fluticasone furoate-vilanterol  1 puff Inhalation Daily   And  . umeclidinium bromide  1 puff Inhalation Daily  . oxybutynin  15 mg Oral QHS  . [START ON 08/08/2020] pantoprazole  40 mg Intravenous Q12H  . pentosan polysulfate  100 mg Oral BID   Continuous Infusions: . sodium chloride 100 mL/hr at 08/05/20 1645  . pantoprozole (PROTONIX) infusion 8 mg/hr (08/05/20 1647)     LOS: 1 day    Para Skeans, MD [facility] Pager [phone] If 7PM-7AM, please contact night-coverage [email or web address] Password [facility] 08/05/2020, 6:17 PM

## 2020-08-06 ENCOUNTER — Inpatient Hospital Stay: Payer: Medicare HMO

## 2020-08-06 ENCOUNTER — Encounter: Payer: Self-pay | Admitting: Family Medicine

## 2020-08-06 LAB — GLUCOSE, CAPILLARY
Glucose-Capillary: 102 mg/dL — ABNORMAL HIGH (ref 70–99)
Glucose-Capillary: 100 mg/dL — ABNORMAL HIGH (ref 70–99)
Glucose-Capillary: 110 mg/dL — ABNORMAL HIGH (ref 70–99)
Glucose-Capillary: 82 mg/dL (ref 70–99)

## 2020-08-06 LAB — TYPE AND SCREEN
ABO/RH(D): A POS
Antibody Screen: NEGATIVE
Unit division: 0
Unit division: 0
Unit division: 0

## 2020-08-06 LAB — BPAM RBC
Blood Product Expiration Date: 202108252359
Blood Product Expiration Date: 202108252359
Blood Product Expiration Date: 202109142359
ISSUE DATE / TIME: 202108202318
ISSUE DATE / TIME: 202108210340
Unit Type and Rh: 6200
Unit Type and Rh: 6200
Unit Type and Rh: 6200

## 2020-08-06 LAB — COMPREHENSIVE METABOLIC PANEL
ALT: 12 U/L (ref 0–44)
AST: 18 U/L (ref 15–41)
Albumin: 3.5 g/dL (ref 3.5–5.0)
Alkaline Phosphatase: 60 U/L (ref 38–126)
Anion gap: 10 (ref 5–15)
BUN: 13 mg/dL (ref 8–23)
CO2: 30 mmol/L (ref 22–32)
Calcium: 8.6 mg/dL — ABNORMAL LOW (ref 8.9–10.3)
Chloride: 102 mmol/L (ref 98–111)
Creatinine, Ser: 0.69 mg/dL (ref 0.44–1.00)
GFR calc Af Amer: >60 mL/min (ref >60)
GFR calc non Af Amer: >60 mL/min (ref >60)
Glucose, Bld: 128 mg/dL — ABNORMAL HIGH (ref 70–99)
Potassium: 3.8 mmol/L (ref 3.5–5.1)
Sodium: 142 mmol/L (ref 135–145)
Total Bilirubin: 0.8 mg/dL (ref 0.3–1.2)
Total Protein: 6.1 g/dL — ABNORMAL LOW (ref 6.5–8.1)

## 2020-08-06 LAB — CBC WITH DIFFERENTIAL/PLATELET
Abs Immature Granulocytes: 0.2 10*3/uL — ABNORMAL HIGH (ref 0.00–0.07)
Basophils Absolute: 0.1 10*3/uL (ref 0.0–0.1)
Basophils Relative: 1 %
Eosinophils Absolute: 0.2 10*3/uL (ref 0.0–0.5)
Eosinophils Relative: 3 %
HCT: 32.5 % — ABNORMAL LOW (ref 36.0–46.0)
Hemoglobin: 9.5 g/dL — ABNORMAL LOW (ref 12.0–15.0)
Immature Granulocytes: 3 %
Lymphocytes Relative: 7 %
Lymphs Abs: 0.6 10*3/uL — ABNORMAL LOW (ref 0.7–4.0)
MCH: 23.6 pg — ABNORMAL LOW (ref 26.0–34.0)
MCHC: 29.2 g/dL — ABNORMAL LOW (ref 30.0–36.0)
MCV: 80.6 fL (ref 80.0–100.0)
Monocytes Absolute: 0.6 10*3/uL (ref 0.1–1.0)
Monocytes Relative: 8 %
Neutro Abs: 6 10*3/uL (ref 1.7–7.7)
Neutrophils Relative %: 78 %
Platelets: 293 10*3/uL (ref 150–400)
RBC: 4.03 MIL/uL (ref 3.87–5.11)
RDW: 17.8 % — ABNORMAL HIGH (ref 11.5–15.5)
WBC: 7.7 10*3/uL (ref 4.0–10.5)
nRBC: 0.5 % — ABNORMAL HIGH (ref 0.0–0.2)

## 2020-08-06 LAB — MAGNESIUM: Magnesium: 1.9 mg/dL (ref 1.7–2.4)

## 2020-08-06 LAB — PHOSPHORUS: Phosphorus: 2.7 mg/dL (ref 2.5–4.6)

## 2020-08-06 MED ORDER — LORAZEPAM 0.5 MG PO TABS
0.5000 mg | ORAL_TABLET | Freq: Four times a day (QID) | ORAL | Status: DC | PRN
  Administered 2020-08-06 – 2020-08-07 (×3): 0.5 mg via ORAL
  Filled 2020-08-06 (×3): qty 1

## 2020-08-06 MED ORDER — ORAL CARE MOUTH RINSE
15.0000 mL | Freq: Two times a day (BID) | OROMUCOSAL | Status: DC
  Administered 2020-08-06 – 2020-08-15 (×11): 15 mL via OROMUCOSAL

## 2020-08-06 MED ORDER — SALINE SPRAY 0.65 % NA SOLN
1.0000 | NASAL | Status: DC | PRN
  Administered 2020-08-09: 1 via NASAL
  Filled 2020-08-06: qty 44

## 2020-08-06 NOTE — Progress Notes (Signed)
PROGRESS NOTE    Jackie Turner  YOV:[id number] DOB: 1936-08-14 DOA: 08/04/2020 PCP: Mar Daring, PA-C   Brief Narrative:  Jackie Turner  is a 84 y.o. Caucasian female with a known history of asthma, dyslipidemia, atrial fibrillation on Eliquis and osteoporosis, who presented to the emergency room with acute onset of likely recurrent melena that she describes as dark stools.  She was started on p.o. Eliquis for atrial fibrillation in early July.  She denies any nausea or vomiting or heartburn or abdominal pain.  She admitted however to dry heaves which has been intermittent lately.  No bright red bleeding per rectum.  No other bleeding diathesis.  No chest pain or dyspnea or cough or wheezing or hemoptysis.  No dysuria, oliguria or hematuria or flank pain.Marland Kitchen  Upon presentation to the emergency room, temperature was 100.2 respiratory rate 25 and blood pressure 145/87 with pulse 79 9% on 4 L of O2 by nasal cannula.  Labs revealed a glucose of 172 and otherwise unremarkable CMP.  CBC showed hemoglobin of 6.3 hematocrit 21.5 compared to 6.4 and 22.1 yesterday and down from 11.4 and 35.8 on 06/22/2020. EKG showed atrial fibrillation with rapid ventricular response of 104.  She was given 40 mg of IV Protonix in the ER.  She will be admitted to a progressive unit bed for further evaluation and management.  Assessment & Plan:  1.  GI bleeding with subsequent acute blood loss anemia. -The patient will be admitted to a progressive unit bed. -We will follow serial hemoglobins and hematocrits. -She was typed and crossmatch and will be transfused 2 units of packed red blood cells that was started in the ER. -Will be placed on IV Protonix bolus and drip. -We will follow posttransfusion H&H. -Eliquis will be held off. -Routine GI consultation will be obtained. -I notified Dr. Bonna Gains about the patient. -Pt is stable and h/h is stable. -diet changed from regular to clear liquid.    2.   Atrial fibrillation with rapid ventricle response. -This like secondary to her anemia. -We will utilize as needed IV Lopressor for rate control. -Eliquis was held off given GI bleeding. -We will continue Cardizem CD.  3.  COPD. -She will be placed on as needed duo nebs.  4.  DVT prophylaxis. -SCDs. -Medical prophylaxis currently contraindicated due to GI bleeding.   Consultants:  GI Consult-Dr.Tahliani   Subjective: Pt seen today and her vitals are stable on current regimen of diltiazem/ ivv ppi.h/h is 9.1. Pt seen today she is very congested as the room temp is over 80 degrees and wants me to turn temp down . Vitals are stable and hr has been steady.Pt has h/o of a.fib  And d/w her about risk of stopping her anticoagulation.   Vitals:   08/06/20 0614 08/06/20 0806  BP: 125/77 136/90  Pulse: (!) 112 (!) 112  Resp: 20 18  Temp: 98 F (36.7 C) 97.9 F (36.6 C)  SpO2: 96% 100%    Objective: Vitals:   08/06/20 0141 08/06/20 0613 08/06/20 0614 08/06/20 0806  BP: 138/76 (!) 141/129 125/77 136/90  Pulse: (!) 103 (!) 112 (!) 112 (!) 112  Resp: 20 20 20 18   Temp: 98 F (36.7 C) 98 F (36.7 C) 98 F (36.7 C) 97.9 F (36.6 C)  TempSrc: Oral Oral  Oral  SpO2: 100% 91% 96% 100%  Weight:      Height:        Intake/Output Summary (Last 24 hours) at 08/06/2020 1126  Last data filed at 08/06/2020 0300 Gross per 24 hour  Intake 2103.79 ml  Output 400 ml  Net 1703.79 ml   Filed Weights   08/04/20 1723 08/05/20 2117  Weight: 54 kg 55.7 kg    Examination: Blood pressure 136/90, pulse (!) 112, temperature 97.9 F (36.6 C), temperature source Oral, resp. rate 18, height 4\' 11"  (1.499 m), weight 55.7 kg, SpO2 100 %. General exam: Appears calm and comfortable  Respiratory system: Clear to auscultation. Scattered basilar wheezing. Cardiovascular system: S1 & S2 heard, RRR. No JVD, murmurs, rubs, gallops or clicks. No pedal edema. Gastrointestinal system: Abdomen is  nondistended, soft and nontender. No organomegaly or masses felt. Normal bowel sounds heard. Central nervous system: Alert and oriented. No focal neurological deficits. Extremities: Symmetric 5 x 5 power. Skin: No rashes, lesions or ulcers Psychiatry: Judgement and insight appear normal. Mood & affect appropriate.   Data Reviewed: I have personally reviewed following labs and imaging studies  CBC: Recent Labs  Lab 08/03/20 1404 08/04/20 1726 08/05/20 1243 08/05/20 1637 08/06/20 1019  WBC 7.2 8.5 8.4  --  7.7  NEUTROABS 5.0  --   --   --  6.0  HGB 6.4* 6.3* 9.4* 9.1* 9.5*  HCT 22.1* 21.5* 31.8* 30.3* 32.5*  MCV 76* 77.6* 78.9*  --  80.6  PLT 338 323 272  --  528   Basic Metabolic Panel: Recent Labs  Lab 08/03/20 1404 08/04/20 1726 08/05/20 1243 08/06/20 1019  NA 142 140 140 142  K 4.9 4.5 4.9 3.8  CL 100 99 97* 102  CO2 29 32 33* 30  GLUCOSE 158* 172* 96 128*  BUN 21 27* 21 13  CREATININE 0.73 0.69 0.62 0.69  CALCIUM 9.3 9.1 8.6* 8.6*  MG  --   --   --  1.9  PHOS  --   --   --  2.7   GFR: Estimated Creatinine Clearance: 41.3 mL/min (by C-G formula based on SCr of 0.69 mg/dL). Liver Function Tests: Recent Labs  Lab 08/03/20 1404 08/04/20 1726 08/06/20 1019  AST 17 20 18   ALT 10 14 12   ALKPHOS 75 61 60  BILITOT 0.2 0.6 0.8  PROT 6.0 6.5 6.1*  ALBUMIN 3.9 3.7 3.5   No results for input(s): LIPASE, AMYLASE in the last 168 hours. No results for input(s): AMMONIA in the last 168 hours. Coagulation Profile: Recent Labs  Lab 08/04/20 1726  INR 1.1   Thyroid Function Tests: Recent Labs    08/03/20 1404  TSH 2.850   Anemia Panel: Recent Labs    08/03/20 1404  VITAMINB12 222*  FOLATE >20.0    Recent Results (from the past 240 hour(s))  SARS Coronavirus 2 by RT PCR (hospital order, performed in [facility] hospital lab) Nasopharyngeal Nasopharyngeal Swab     Status: None   Collection Time: 08/04/20 10:53 PM   Specimen: Nasopharyngeal Swab  Result  Value Ref Range Status   SARS Coronavirus 2 NEGATIVE NEGATIVE Final    Comment: (NOTE) SARS-CoV-2 target nucleic acids are NOT DETECTED.  The SARS-CoV-2 RNA is generally detectable in upper and lower respiratory specimens during the acute phase of infection. The lowest concentration of SARS-CoV-2 viral copies this assay can detect is 250 copies / mL. A negative result does not preclude SARS-CoV-2 infection and should not be used as the sole basis for treatment or other patient management decisions.  A negative result may occur with improper specimen collection / handling, submission of specimen other than nasopharyngeal  swab, presence of viral mutation(s) within the areas targeted by this assay, and inadequate number of viral copies (<250 copies / mL). A negative result must be combined with clinical observations, patient history, and epidemiological information.  Fact Sheet for Patients:   StrictlyIdeas.no  Fact Sheet for Healthcare Providers: BankingDealers.co.za  This test is not yet approved or  cleared by the [place] FDA and has been authorized for detection and/or diagnosis of SARS-CoV-2 by FDA under an Emergency Use Authorization (EUA).  This EUA will remain in effect (meaning this test can be used) for the duration of the COVID-19 declaration under Section 564(b)(1) of the Act, 21 U.S.C. section 360bbb-3(b)(1), unless the authorization is terminated or revoked sooner.  Performed at [facility], [address] [place], [place] [address]      Radiology Studies: No results found.  Scheduled Meds:  diltiazem  120 mg Oral Daily   doxepin  30 mg Oral QHS   fluticasone furoate-vilanterol  1 puff Inhalation Daily   And   umeclidinium bromide  1 puff Inhalation Daily   mouth rinse  15 mL Mouth Rinse BID   oxybutynin  15 mg Oral QHS   [START ON 08/08/2020] pantoprazole  40 mg Intravenous Q12H    pentosan polysulfate  100 mg Oral BID   Continuous Infusions:  sodium chloride 100 mL/hr at 08/06/20 0300   pantoprozole (PROTONIX) infusion 8 mg/hr (08/06/20 0300)     LOS: 2 days  Para Skeans, MD [facility] Pager [phone] If 7PM-7AM, please contact night-coverage [email or web address] Password [facility] 08/06/2020, 11:26 AM

## 2020-08-06 NOTE — Progress Notes (Signed)
Complaining of nasal congestion.  Nasal spray ordered per standing orders.

## 2020-08-06 NOTE — Plan of Care (Signed)
  Problem: Bowel/Gastric: Goal: Will show no signs and symptoms of gastrointestinal bleeding Outcome: Progressing  No stools or obvious bleeding noted this shift.

## 2020-08-07 ENCOUNTER — Inpatient Hospital Stay: Payer: Medicare HMO | Admitting: Internal Medicine

## 2020-08-07 ENCOUNTER — Inpatient Hospital Stay: Payer: Medicare HMO

## 2020-08-07 DIAGNOSIS — J9602 Acute respiratory failure with hypercapnia: Secondary | ICD-10-CM

## 2020-08-07 DIAGNOSIS — R0600 Dyspnea, unspecified: Secondary | ICD-10-CM

## 2020-08-07 DIAGNOSIS — I480 Paroxysmal atrial fibrillation: Secondary | ICD-10-CM

## 2020-08-07 LAB — BASIC METABOLIC PANEL
Anion gap: 11 (ref 5–15)
Anion gap: 12 (ref 5–15)
BUN: 12 mg/dL (ref 8–23)
BUN: 14 mg/dL (ref 8–23)
CO2: 31 mmol/L (ref 22–32)
CO2: 33 mmol/L — ABNORMAL HIGH (ref 22–32)
Calcium: 8.8 mg/dL — ABNORMAL LOW (ref 8.9–10.3)
Calcium: 8.2 mg/dL — ABNORMAL LOW (ref 8.9–10.3)
Chloride: 102 mmol/L (ref 98–111)
Chloride: 99 mmol/L (ref 98–111)
Creatinine, Ser: 0.67 mg/dL (ref 0.44–1.00)
Creatinine, Ser: 0.59 mg/dL (ref 0.44–1.00)
GFR calc Af Amer: >60 mL/min (ref >60)
GFR calc Af Amer: >60 mL/min (ref >60)
GFR calc non Af Amer: >60 mL/min (ref >60)
GFR calc non Af Amer: >60 mL/min (ref >60)
Glucose, Bld: 124 mg/dL — ABNORMAL HIGH (ref 70–99)
Glucose, Bld: 144 mg/dL — ABNORMAL HIGH (ref 70–99)
Potassium: 4 mmol/L (ref 3.5–5.1)
Potassium: 3.7 mmol/L (ref 3.5–5.1)
Sodium: 144 mmol/L (ref 135–145)
Sodium: 144 mmol/L (ref 135–145)

## 2020-08-07 LAB — PHOSPHORUS: Phosphorus: 2.7 mg/dL (ref 2.5–4.6)

## 2020-08-07 LAB — CBC
HCT: 31.9 % — ABNORMAL LOW (ref 36.0–46.0)
Hemoglobin: 9.1 g/dL — ABNORMAL LOW (ref 12.0–15.0)
MCH: 23.5 pg — ABNORMAL LOW (ref 26.0–34.0)
MCHC: 28.5 g/dL — ABNORMAL LOW (ref 30.0–36.0)
MCV: 82.2 fL (ref 80.0–100.0)
Platelets: 318 10*3/uL (ref 150–400)
RBC: 3.88 MIL/uL (ref 3.87–5.11)
RDW: 18.3 % — ABNORMAL HIGH (ref 11.5–15.5)
WBC: 8.6 10*3/uL (ref 4.0–10.5)
nRBC: 0.4 % — ABNORMAL HIGH (ref 0.0–0.2)

## 2020-08-07 LAB — MAGNESIUM
Magnesium: 2 mg/dL (ref 1.7–2.4)
Magnesium: 1.7 mg/dL (ref 1.7–2.4)

## 2020-08-07 LAB — BLOOD GAS, ARTERIAL
Bicarbonate: 41 mmol/L — ABNORMAL HIGH (ref 20.0–28.0)
FIO2: 0.36
O2 Saturation: 99 %
pCO2 arterial: 71 mmHg (ref 32.0–48.0)
pH, Arterial: 7.37 (ref 7.350–7.450)
pO2, Arterial: 97 mmHg (ref 83.0–108.0)

## 2020-08-07 LAB — BRAIN NATRIURETIC PEPTIDE: B Natriuretic Peptide: 232.1 pg/mL — ABNORMAL HIGH (ref 0.0–100.0)

## 2020-08-07 MED ORDER — ZIPRASIDONE MESYLATE 20 MG IM SOLR
10.0000 mg | Freq: Once | INTRAMUSCULAR | Status: AC
  Administered 2020-08-07: 10 mg via INTRAMUSCULAR
  Filled 2020-08-07 (×2): qty 20

## 2020-08-07 MED ORDER — IPRATROPIUM-ALBUTEROL 0.5-2.5 (3) MG/3ML IN SOLN
3.0000 mL | Freq: Once | RESPIRATORY_TRACT | Status: DC
  Filled 2020-08-07: qty 3

## 2020-08-07 MED ORDER — FUROSEMIDE 10 MG/ML IJ SOLN
20.0000 mg | Freq: Two times a day (BID) | INTRAMUSCULAR | Status: AC
  Administered 2020-08-08 – 2020-08-09 (×4): 20 mg via INTRAVENOUS
  Filled 2020-08-07 (×4): qty 2

## 2020-08-07 MED ORDER — PANTOPRAZOLE SODIUM 40 MG IV SOLR
40.0000 mg | Freq: Two times a day (BID) | INTRAVENOUS | Status: DC
  Administered 2020-08-07 – 2020-08-11 (×10): 40 mg via INTRAVENOUS
  Filled 2020-08-07 (×10): qty 40

## 2020-08-07 MED ORDER — MORPHINE SULFATE (PF) 2 MG/ML IV SOLN
2.0000 mg | Freq: Once | INTRAVENOUS | Status: AC
  Administered 2020-08-07: 2 mg via INTRAMUSCULAR

## 2020-08-07 MED ORDER — FUROSEMIDE 10 MG/ML IJ SOLN
20.0000 mg | Freq: Once | INTRAMUSCULAR | Status: AC
  Administered 2020-08-07: 20 mg via INTRAVENOUS

## 2020-08-07 MED ORDER — SODIUM CHLORIDE 0.9 % IV SOLN
2.0000 g | INTRAVENOUS | Status: DC
  Administered 2020-08-07 – 2020-08-10 (×4): 2 g via INTRAVENOUS
  Filled 2020-08-07 (×4): qty 2

## 2020-08-07 MED ORDER — FUROSEMIDE 10 MG/ML IJ SOLN
20.0000 mg | Freq: Once | INTRAMUSCULAR | Status: AC
  Administered 2020-08-07: 20 mg via INTRAVENOUS
  Filled 2020-08-07: qty 2

## 2020-08-07 MED ORDER — HALOPERIDOL LACTATE 5 MG/ML IJ SOLN
INTRAMUSCULAR | Status: AC
  Administered 2020-08-07: 2.5 mg
  Filled 2020-08-07: qty 1

## 2020-08-07 MED ORDER — HALOPERIDOL LACTATE 5 MG/ML IJ SOLN: 5.0000 mg | Freq: Once | INTRAMUSCULAR | Status: AC

## 2020-08-07 MED ORDER — HALOPERIDOL LACTATE 5 MG/ML IJ SOLN
INTRAMUSCULAR | Status: AC
  Filled 2020-08-07: qty 1

## 2020-08-07 MED ORDER — FUROSEMIDE 10 MG/ML IJ SOLN
INTRAMUSCULAR | Status: AC
  Filled 2020-08-07: qty 2

## 2020-08-07 MED ORDER — MAGNESIUM SULFATE IN D5W 1-5 GM/100ML-% IV SOLN
1.0000 g | Freq: Once | INTRAVENOUS | Status: AC
  Administered 2020-08-07: 1 g via INTRAVENOUS
  Filled 2020-08-07: qty 100

## 2020-08-07 MED ORDER — MORPHINE SULFATE (PF) 2 MG/ML IV SOLN
INTRAVENOUS | Status: AC
  Filled 2020-08-07: qty 1

## 2020-08-07 MED ORDER — MORPHINE SULFATE (PF) 2 MG/ML IV SOLN
1.0000 mg | INTRAVENOUS | Status: DC | PRN
  Administered 2020-08-07 – 2020-08-10 (×3): 1 mg via INTRAVENOUS
  Filled 2020-08-07 (×3): qty 1

## 2020-08-07 MED ORDER — MORPHINE SULFATE (PF) 2 MG/ML IV SOLN: 2.0000 mg | Freq: Once | INTRAVENOUS | Status: DC

## 2020-08-07 MED ORDER — METRONIDAZOLE IN NACL 5-0.79 MG/ML-% IV SOLN
500.0000 mg | Freq: Three times a day (TID) | INTRAVENOUS | Status: DC
  Administered 2020-08-07 – 2020-08-09 (×7): 500 mg via INTRAVENOUS
  Filled 2020-08-07 (×10): qty 100

## 2020-08-07 NOTE — Progress Notes (Signed)
Pt found getting oob naked at this time. Assisted back to bed.  Np called.

## 2020-08-07 NOTE — Progress Notes (Signed)
ICU bed not available.  IV team still trying to get IV.  NP aware.  Bipap on.  RT called as Bipap keeps alarming.  RT at bs now.

## 2020-08-07 NOTE — Progress Notes (Signed)
Patient very agitated and combative.  Hitting/yelling at staff.  MD present at bedside.  Lasix given/catheter inserted, haldol given, morphine given.  Patient now resting with granddaughter at bedside.

## 2020-08-07 NOTE — Progress Notes (Signed)
Lorazepam given per request for anxiety earlier this shift.  Patient with increased agitation and impulsive with getting up from recliner unassisted. Declines to ge tin bed due to "back Problems" and sleeping in recliner at home.  Floor pads placed for safety, chair alarm on and intentional rounding increased by staff.

## 2020-08-07 NOTE — Progress Notes (Signed)
PROGRESS NOTE    Jackie Turner  ACZ:[id number] DOB: Sep 16, 1936 DOA: 08/04/2020 PCP: Mar Daring, PA-C   Brief Narrative:  Jackie Turner  is a 84 y.o. Caucasian female with a known history of asthma, dyslipidemia, atrial fibrillation on Eliquis and osteoporosis, who presented to the emergency room with acute onset of likely recurrent melena that she describes as dark stools.  She was started on p.o. Eliquis for atrial fibrillation in early July.  She denies any nausea or vomiting or heartburn or abdominal pain.  She admitted however to dry heaves which has been intermittent lately.  No bright red bleeding per rectum.  No other bleeding diathesis.  No chest pain or dyspnea or cough or wheezing or hemoptysis.  No dysuria, oliguria or hematuria or flank pain.Marland Kitchen  Upon presentation to the emergency room, temperature was 100.2 respiratory rate 25 and blood pressure 145/87 with pulse 79 9% on 4 L of O2 by nasal cannula.  Labs revealed a glucose of 172 and otherwise unremarkable CMP.  CBC showed hemoglobin of 6.3 hematocrit 21.5 compared to 6.4 and 22.1 yesterday and down from 11.4 and 35.8 on 06/22/2020. EKG showed atrial fibrillation with rapid ventricular response of 104.  She was given 40 mg of IV Protonix in the ER.  She will be admitted to a progressive unit bed for further evaluation and management.  Assessment & Plan:  1.  GI bleeding with subsequent acute blood loss anemia. -The patient will be admitted to a progressive unit bed. -We will follow serial hemoglobins and hematocrits. -She was typed and crossmatch and will be transfused 2 units of packed red blood cells that was started in the ER. -Will be placed on IV Protonix bolus and drip. -We will follow posttransfusion H&H. -Eliquis will be held off. -Routine GI consultation will be obtained. -I notified Dr. Bonna Gains about the patient. -Pt is stable and h/h is stable. -diet changed from regular to clear liquid.  -h/h is  stable and EGD once Respiratory failure is resolved.    2.  Atrial fibrillation with rapid ventricle response. -This like secondary to her anemia. -We will utilize as needed IV Lopressor for rate control. -Eliquis was held off given GI bleeding. -We will continue Cardizem CD. -Sinus rhythm cont Cardizem.  3.  COPD. -She will be placed on as needed duo nebs. - Abg shows hypercapnia and pt started on bipap.  4.  DVT prophylaxis. -SCDs. -Medical prophylaxis currently contraindicated due to GI bleeding.   Consultants:  GI Consult-Dr.Tahliani   Subjective: Pt seen today and her vitals are stable on current regimen of diltiazem/ ivv ppi.h/h is 9.1. Pt seen today she is very congested as the room temp is over 80 degrees and wants me to turn temp down . Vitals are stable and hr has been steady.Pt has h/o of a.fib  And d/w her about risk of stopping her anticoagulation.   Pt has been stable but states that she has been son since yesterday night. Stat chest xray/ lasix and abg and found that pt has pulmonary vascular congestion ef in her echo two months ago was 60%. And creatinine is wnl. Pt has put out about a 1L with lasix 20 and repeat dose of 4 hour later was given to pt and she was confused and agitated and morphine 15m g and haldol to keep her calm.  Patient was assessed again in the afternoon she is alert, and [name] her granddaughter is at bedside with [name] about volume overload due  to IV fluids, however this is not expected but not uncommon in her age and poor nutritional status.  She has heart and kidney function are within normal limits and IV fluid hydration was given to maintain systolic blood pressure to prevent hypotension to her to her GI bleed.  Verbalized understanding. Patient after being given Haldol was settled a stat ABG showed hypercapnia with PCO2 in the 70s respiratory therapist started patient on BiPAP therapy.  We will continue to monitor and follow patient closely  overnight to prevent worsening of her acute hypercapnic respiratory failure.   Vitals:   08/07/20 1259 08/07/20 1637  BP:  (!) 134/95  Pulse:  (!) 110  Resp: (!) 25 (!) 22  Temp:  97.8 F (36.6 C)  SpO2: 93% 97%    Objective: Vitals:   08/07/20 0749 08/07/20 1153 08/07/20 1259 08/07/20 1637  BP: (!) 148/96 117/90  (!) 134/95  Pulse: (!) 105 67  (!) 110  Resp: 18 19 (!) 25 (!) 22  Temp: 98.3 F (36.8 C) 98.1 F (36.7 C)  97.8 F (36.6 C)  TempSrc: Oral     SpO2: (!) 89% (!) 89% 93% 97%  Weight:      Height:        Intake/Output Summary (Last 24 hours) at 08/07/2020 1740 Last data filed at 08/07/2020 1541 Gross per 24 hour  Intake 1916.49 ml  Output 1300 ml  Net 616.49 ml   Filed Weights   08/04/20 1723 08/05/20 2117 08/07/20 0306  Weight: 54 kg 55.7 kg 56.9 kg    Examination: Blood pressure (!) 134/95, pulse (!) 110, temperature 97.8 F (36.6 C), resp. rate (!) 22, height 4\' 11"  (1.499 m), weight 56.9 kg, SpO2 97 %. General exam: Appears calm and comfortable  Respiratory system: Clear to auscultation. Scattered basilar wheezing. Cardiovascular system: S1 & S2 heard, RRR. No JVD, murmurs, rubs, gallops or clicks. No pedal edema. Gastrointestinal system: Abdomen is nondistended, soft and nontender. No organomegaly or masses felt. Normal bowel sounds heard. Central nervous system: Alert and oriented. No focal neurological deficits. Extremities: Symmetric 5 x 5 power. Skin: No rashes, lesions or ulcers Psychiatry: Judgement and insight appear normal. Mood & affect appropriate.   Data Reviewed: I have personally reviewed following labs and imaging studies  CBC: Recent Labs  Lab 08/03/20 1404 08/04/20 1726 08/05/20 1243 08/05/20 1637 08/06/20 1019 08/07/20 0543  WBC 7.2 8.5 8.4  --  7.7 8.6  NEUTROABS 5.0  --   --   --  6.0  --   HGB 6.4* 6.3* 9.4* 9.1* 9.5* 9.1*  HCT 22.1* 21.5* 31.8* 30.3* 32.5* 31.9*  MCV 76* 77.6* 78.9*  --  80.6 82.2  PLT 338 323 272   --  293 010   Basic Metabolic Panel: Recent Labs  Lab 08/04/20 1726 08/05/20 1243 08/06/20 1019 08/07/20 0543 08/07/20 1522  NA 140 140 142 144 144  K 4.5 4.9 3.8 4.0 3.7  CL 99 97* 102 102 99  CO2 32 33* 30 31 33*  GLUCOSE 172* 96 128* 124* 144*  BUN 27* 21 13 12 14   CREATININE 0.69 0.62 0.69 0.67 0.59  CALCIUM 9.1 8.6* 8.6* 8.8* 8.2*  MG  --   --  1.9 2.0 1.7  PHOS  --   --  2.7 2.7  --    GFR: Estimated Creatinine Clearance: 41.7 mL/min (by C-G formula based on SCr of 0.59 mg/dL). Liver Function Tests: Recent Labs  Lab 08/03/20 1404 08/04/20 1726 08/06/20  1019  AST 17 20 18   ALT 10 14 12   ALKPHOS 75 61 60  BILITOT 0.2 0.6 0.8  PROT 6.0 6.5 6.1*  ALBUMIN 3.9 3.7 3.5   No results for input(s): LIPASE, AMYLASE in the last 168 hours. No results for input(s): AMMONIA in the last 168 hours. Coagulation Profile: Recent Labs  Lab 08/04/20 1726  INR 1.1   Thyroid Function Tests: No results for input(s): TSH, T4TOTAL, FREET4, T3FREE, THYROIDAB in the last 72 hours. Anemia Panel: No results for input(s): VITAMINB12, FOLATE, FERRITIN, TIBC, IRON, RETICCTPCT in the last 72 hours.  Recent Results (from the past 240 hour(s))  SARS Coronavirus 2 by RT PCR (hospital order, performed in [facility] hospital lab) Nasopharyngeal Nasopharyngeal Swab     Status: None   Collection Time: 08/04/20 10:53 PM   Specimen: Nasopharyngeal Swab  Result Value Ref Range Status   SARS Coronavirus 2 NEGATIVE NEGATIVE Final    Comment: (NOTE) SARS-CoV-2 target nucleic acids are NOT DETECTED.  The SARS-CoV-2 RNA is generally detectable in upper and lower respiratory specimens during the acute phase of infection. The lowest concentration of SARS-CoV-2 viral copies this assay can detect is 250 copies / mL. A negative result does not preclude SARS-CoV-2 infection and should not be used as the sole basis for treatment or other patient management decisions.  A negative result may occur  with improper specimen collection / handling, submission of specimen other than nasopharyngeal swab, presence of viral mutation(s) within the areas targeted by this assay, and inadequate number of viral copies (<250 copies / mL). A negative result must be combined with clinical observations, patient history, and epidemiological information.  Fact Sheet for Patients:   StrictlyIdeas.no  Fact Sheet for Healthcare Providers: BankingDealers.co.za  This test is not yet approved or  cleared by the [place] FDA and has been authorized for detection and/or diagnosis of SARS-CoV-2 by FDA under an Emergency Use Authorization (EUA).  This EUA will remain in effect (meaning this test can be used) for the duration of the COVID-19 declaration under Section 564(b)(1) of the Act, 21 U.S.C. section 360bbb-3(b)(1), unless the authorization is terminated or revoked sooner.  Performed at [facility], [address] [place], [place] [address]      Radiology Studies: [facility] Chest [place] 1 View  Result Date: 08/07/2020 CLINICAL DATA:  Increased shortness of breath EXAM: PORTABLE CHEST 1 VIEW COMPARISON:  08/06/2020 FINDINGS: Interval increase in diffuse bilateral interstitial pulmonary opacity. Unchanged elevation of the left hemidiaphragm. Probable small layering bilateral pleural effusions. Mild cardiomegaly. IMPRESSION: Interval increase in diffuse bilateral interstitial pulmonary opacity, which may reflect edema and/or infection. Probable small layering bilateral pleural effusions. Electronically Signed   By: Eddie Candle M.D.   On: 08/07/2020 10:15   DG Chest Port 1 View  Result Date: 08/06/2020 CLINICAL DATA:  Wheezing, anemia EXAM: PORTABLE CHEST 1 VIEW COMPARISON:  06/12/2020 chest radiograph. FINDINGS: Chronic prominent elevation of the left hemidiaphragm. Stable cardiomediastinal silhouette with mild cardiomegaly. No pneumothorax. Small  bilateral pleural effusions are increased bilaterally. Patchy consolidation at both lung bases, worsened. IMPRESSION: 1. Chronic prominent left hemidiaphragm elevation. Worsened patchy consolidation at both lung bases, which could represent any combination of atelectasis, aspiration or pneumonia. 2. Mild cardiomegaly. Small bilateral pleural effusions are increased bilaterally. Electronically Signed   By: Ilona Sorrel M.D.   On: 08/06/2020 11:55    Scheduled Meds: . diltiazem  120 mg Oral Daily  . doxepin  30 mg Oral QHS  .  fluticasone furoate-vilanterol  1 puff Inhalation Daily   And  . umeclidinium bromide  1 puff Inhalation Daily  . furosemide      . [START ON 08/08/2020] furosemide  20 mg Intravenous Q12H  . ipratropium-albuterol  3 mL Nebulization Once  . mouth rinse  15 mL Mouth Rinse BID  . morphine      . oxybutynin  15 mg Oral QHS  . pantoprazole (PROTONIX) IV  40 mg Intravenous Q12H  . pentosan polysulfate  100 mg Oral BID   Continuous Infusions: . cefTRIAXone (ROCEPHIN)  IV Stopped (08/07/20 1450)  . metronidazole Stopped (08/07/20 1354)     LOS: 3 days  Para Skeans, MD [facility] Pager [phone] If 7PM-7AM, please contact night-coverage [email or web address] Password [facility] 08/07/2020, 5:40 PM

## 2020-08-07 NOTE — Progress Notes (Signed)
Vonda Antigua, MD [address], [place], [place], [place], [address] [address], [place], [place], [place], [address] Phone: [phone]  Fax: [phone]   Subjective:  Patient sitting up in chair when I walked into the room today.  Is on oxygen via nasal cannula, and appears short of breath.  Patient nurse notified and states that primary attending, Dr. Posey Pronto was just in to see the patient regarding the shortness of breath as well  Objective: Exam: Vital signs in last 24 hours: Vitals:   08/07/20 0306 08/07/20 0418 08/07/20 0641 08/07/20 0749  BP:  (!) 148/127 (!) 154/82 (!) 148/96  Pulse:  (!) 122 (!) 109 (!) 105  Resp:  18 18 18   Temp:  98.4 F (36.9 C) 98.8 F (37.1 C) 98.3 F (36.8 C)  TempSrc:  Oral Oral Oral  SpO2:  95% 95% (!) 89%  Weight: 56.9 kg     Height:       Weight change: 1.225 kg  Intake/Output Summary (Last 24 hours) at 08/07/2020 1051 Last data filed at 08/07/2020 0454 Gross per 24 hour  Intake 1792.77 ml  Output 402 ml  Net 1390.77 ml    General: No acute distress, AAO x3 Abd: Soft, NT/ND, No HSM Skin: Warm, no rashes Neck: Supple, Trachea midline   Lab Results: Lab Results  Component Value Date   WBC 8.6 08/07/2020   HGB 9.1 (L) 08/07/2020   HCT 31.9 (L) 08/07/2020   MCV 82.2 08/07/2020   PLT 318 08/07/2020   Micro Results: Recent Results (from the past 240 hour(s))  SARS Coronavirus 2 by RT PCR (hospital order, performed in [facility] hospital lab) Nasopharyngeal Nasopharyngeal Swab     Status: None   Collection Time: 08/04/20 10:53 PM   Specimen: Nasopharyngeal Swab  Result Value Ref Range Status   SARS Coronavirus 2 NEGATIVE NEGATIVE Final    Comment: (NOTE) SARS-CoV-2 target nucleic acids are NOT DETECTED.  The SARS-CoV-2 RNA is generally detectable in upper and lower respiratory specimens during the acute phase of infection. The lowest concentration of SARS-CoV-2 viral copies this assay can detect is 250 copies  / mL. A negative result does not preclude SARS-CoV-2 infection and should not be used as the sole basis for treatment or other patient management decisions.  A negative result may occur with improper specimen collection / handling, submission of specimen other than nasopharyngeal swab, presence of viral mutation(s) within the areas targeted by this assay, and inadequate number of viral copies (<250 copies / mL). A negative result must be combined with clinical observations, patient history, and epidemiological information.  Fact Sheet for Patients:   StrictlyIdeas.no  Fact Sheet for Healthcare Providers: BankingDealers.co.za  This test is not yet approved or  cleared by the [place] FDA and has been authorized for detection and/or diagnosis of SARS-CoV-2 by FDA under an Emergency Use Authorization (EUA).  This EUA will remain in effect (meaning this test can be used) for the duration of the COVID-19 declaration under Section 564(b)(1) of the Act, 21 U.S.C. section 360bbb-3(b)(1), unless the authorization is terminated or revoked sooner.  Performed at [facility], [address] [place], [place] [address]    Studies/Results: [facility] Chest [place] 1 View  Result Date: 08/07/2020 CLINICAL DATA:  Increased shortness of breath EXAM: PORTABLE CHEST 1 VIEW COMPARISON:  08/06/2020 FINDINGS: Interval increase in diffuse bilateral interstitial pulmonary opacity. Unchanged elevation of the left hemidiaphragm. Probable small layering bilateral pleural effusions. Mild cardiomegaly. IMPRESSION: Interval increase  in diffuse bilateral interstitial pulmonary opacity, which may reflect edema and/or infection. Probable small layering bilateral pleural effusions. Electronically Signed   By: Eddie Candle M.D.   On: 08/07/2020 10:15   DG Chest Port 1 View  Result Date: 08/06/2020 CLINICAL DATA:  Wheezing, anemia EXAM: PORTABLE CHEST 1 VIEW  COMPARISON:  06/12/2020 chest radiograph. FINDINGS: Chronic prominent elevation of the left hemidiaphragm. Stable cardiomediastinal silhouette with mild cardiomegaly. No pneumothorax. Small bilateral pleural effusions are increased bilaterally. Patchy consolidation at both lung bases, worsened. IMPRESSION: 1. Chronic prominent left hemidiaphragm elevation. Worsened patchy consolidation at both lung bases, which could represent any combination of atelectasis, aspiration or pneumonia. 2. Mild cardiomegaly. Small bilateral pleural effusions are increased bilaterally. Electronically Signed   By: Ilona Sorrel M.D.   On: 08/06/2020 11:55   Medications:  Scheduled Meds: . diltiazem  120 mg Oral Daily  . doxepin  30 mg Oral QHS  . fluticasone furoate-vilanterol  1 puff Inhalation Daily   And  . umeclidinium bromide  1 puff Inhalation Daily  . ipratropium-albuterol  3 mL Nebulization Once  . mouth rinse  15 mL Mouth Rinse BID  . oxybutynin  15 mg Oral QHS  . [START ON 08/08/2020] pantoprazole  40 mg Intravenous Q12H  . pentosan polysulfate  100 mg Oral BID   Continuous Infusions: . cefTRIAXone (ROCEPHIN)  IV    . metronidazole    . pantoprozole (PROTONIX) infusion 8 mg/hr (08/07/20 0310)   PRN Meds:.acetaminophen **OR** acetaminophen, albuterol, dicyclomine, ipratropium-albuterol, LORazepam, ondansetron **OR** ondansetron (ZOFRAN) IV, sodium chloride, traMADol, traZODone   Assessment: Active Problems:   GI bleeding    Plan: No further signs of active bleeding Hemoglobin stable Patient will need to be medically optimized from shortness of breath perspective and this will need to be worked up prior to any endoscopic procedures  Chest x-ray today shows diffuse bilateral interstitial pulmonary opacity, probable small layering bilateral pleural effusions.  In order to avoid any complications during procedure and any hypoxia, would recommend medical optimization as necessary by primary team  today.  Proceed with endoscopy tomorrow if medically optimized by then  PPI IV twice daily  Continue serial CBCs and transfuse PRN Avoid NSAIDs Maintain 2 large-bore IV lines Please page GI with any acute hemodynamic changes, or signs of active GI bleeding   LOS: 3 days   Vonda Antigua, MD 08/07/2020, 10:51 AM

## 2020-08-07 NOTE — Progress Notes (Signed)
Geodon 10 mg given IM left del;toid at this time.  Pt has been combative ( slapped nurse) and has pulled her IV out, pulled at her foley catheter and has pulled her bipap off.  Pt now on O2 at 4 liters  w/ O2 sat 92-98%.  Pty confused and agitated.

## 2020-08-07 NOTE — Care Management Important Message (Signed)
Important Message  Patient Details  Name: Jackie Turner MRN: [id number] Date of Birth: July 09, 1936   Medicare Important Message Given:  Yes  Initial Medicare given by Patient Access Associate on 08/07/2020 at 10:58am.   Dannette Barbara 08/07/2020, 1:48 PM

## 2020-08-07 NOTE — Progress Notes (Signed)
Pt resting quietly with eyes closed.  Resp even and unlabored.  Pt remains on Bipap with O2 sat of 96%.

## 2020-08-08 ENCOUNTER — Inpatient Hospital Stay: Payer: Medicare HMO

## 2020-08-08 ENCOUNTER — Encounter: Payer: Self-pay | Admitting: Anesthesiology

## 2020-08-08 ENCOUNTER — Encounter: Payer: Self-pay | Admitting: Physician Assistant

## 2020-08-08 ENCOUNTER — Encounter
Admission: EM | Disposition: A | Discharge status: Home Health | Payer: Self-pay | Source: Home / Self Care | Attending: Internal Medicine

## 2020-08-08 ENCOUNTER — Encounter: Payer: Self-pay | Admitting: Family Medicine

## 2020-08-08 LAB — BLOOD GAS, ARTERIAL
Acid-Base Excess: 14.4 mmol/L — ABNORMAL HIGH (ref 0.0–2.0)
Acid-Base Excess: 17.7 mmol/L — ABNORMAL HIGH (ref 0.0–2.0)
Bicarbonate: 40.9 mmol/L — ABNORMAL HIGH (ref 20.0–28.0)
Bicarbonate: 43.7 mmol/L — ABNORMAL HIGH (ref 20.0–28.0)
Delivery systems: POSITIVE
Delivery systems: POSITIVE
Expiratory PAP: 7
Expiratory PAP: 7
FIO2: 0.35
FIO2: 0.35
Inspiratory PAP: 14
Inspiratory PAP: 14
Mechanical Rate: 12
O2 Saturation: 97.1 %
O2 Saturation: 97.8 %
Patient temperature: 37
Patient temperature: 37
RATE: 12 resp/min
RATE: 12 resp/min
pCO2 arterial: 63 mmHg — ABNORMAL HIGH (ref 32.0–48.0)
pCO2 arterial: 60 mmHg — ABNORMAL HIGH (ref 32.0–48.0)
pH, Arterial: 7.42 (ref 7.350–7.450)
pH, Arterial: 7.47 — ABNORMAL HIGH (ref 7.350–7.450)
pO2, Arterial: 90 mmHg (ref 83.0–108.0)
pO2, Arterial: 95 mmHg (ref 83.0–108.0)

## 2020-08-08 LAB — HEMOGLOBIN A1C
Hgb A1c MFr Bld: 6.2 % — ABNORMAL HIGH (ref 4.8–5.6)
Mean Plasma Glucose: 131 mg/dL

## 2020-08-08 LAB — CBC WITH DIFFERENTIAL/PLATELET
Abs Immature Granulocytes: 0.09 10*3/uL — ABNORMAL HIGH (ref 0.00–0.07)
Basophils Absolute: 0.1 10*3/uL (ref 0.0–0.1)
Basophils Relative: 1 %
Eosinophils Absolute: 0.3 10*3/uL (ref 0.0–0.5)
Eosinophils Relative: 4 %
HCT: 30.4 % — ABNORMAL LOW (ref 36.0–46.0)
Hemoglobin: 8.9 g/dL — ABNORMAL LOW (ref 12.0–15.0)
Immature Granulocytes: 1 %
Lymphocytes Relative: 11 %
Lymphs Abs: 0.8 10*3/uL (ref 0.7–4.0)
MCH: 23.7 pg — ABNORMAL LOW (ref 26.0–34.0)
MCHC: 29.3 g/dL — ABNORMAL LOW (ref 30.0–36.0)
MCV: 81.1 fL (ref 80.0–100.0)
Monocytes Absolute: 0.9 10*3/uL (ref 0.1–1.0)
Monocytes Relative: 12 %
Neutro Abs: 5.2 10*3/uL (ref 1.7–7.7)
Neutrophils Relative %: 71 %
Platelets: 265 10*3/uL (ref 150–400)
RBC: 3.75 MIL/uL — ABNORMAL LOW (ref 3.87–5.11)
RDW: 18.6 % — ABNORMAL HIGH (ref 11.5–15.5)
WBC: 7.3 10*3/uL (ref 4.0–10.5)
nRBC: 0 % (ref 0.0–0.2)

## 2020-08-08 LAB — COMPREHENSIVE METABOLIC PANEL
ALT: 11 U/L (ref 0–44)
AST: 15 U/L (ref 15–41)
Albumin: 3.3 g/dL — ABNORMAL LOW (ref 3.5–5.0)
Alkaline Phosphatase: 60 U/L (ref 38–126)
Anion gap: 11 (ref 5–15)
BUN: 16 mg/dL (ref 8–23)
CO2: 36 mmol/L — ABNORMAL HIGH (ref 22–32)
Calcium: 8.3 mg/dL — ABNORMAL LOW (ref 8.9–10.3)
Chloride: 99 mmol/L (ref 98–111)
Creatinine, Ser: 0.64 mg/dL (ref 0.44–1.00)
GFR calc Af Amer: >60 mL/min (ref >60)
GFR calc non Af Amer: >60 mL/min (ref >60)
Glucose, Bld: 99 mg/dL (ref 70–99)
Potassium: 3.3 mmol/L — ABNORMAL LOW (ref 3.5–5.1)
Sodium: 146 mmol/L — ABNORMAL HIGH (ref 135–145)
Total Bilirubin: 0.7 mg/dL (ref 0.3–1.2)
Total Protein: 5.8 g/dL — ABNORMAL LOW (ref 6.5–8.1)

## 2020-08-08 LAB — MAGNESIUM: Magnesium: 2.2 mg/dL (ref 1.7–2.4)

## 2020-08-08 LAB — PHOSPHORUS: Phosphorus: 2.3 mg/dL — ABNORMAL LOW (ref 2.5–4.6)

## 2020-08-08 SURGERY — EGD (ESOPHAGOGASTRODUODENOSCOPY)
Anesthesia: General

## 2020-08-08 MED ORDER — POTASSIUM PHOSPHATES 15 MMOLE/5ML IV SOLN
30.0000 mmol | Freq: Once | INTRAVENOUS | Status: AC
  Administered 2020-08-08: 30 mmol via INTRAVENOUS
  Filled 2020-08-08: qty 10

## 2020-08-08 MED ORDER — HALOPERIDOL LACTATE 5 MG/ML IJ SOLN: 5.0000 mg | Freq: Four times a day (QID) | INTRAMUSCULAR | Status: DC | PRN

## 2020-08-08 MED ORDER — HALOPERIDOL 5 MG PO TABS
5.0000 mg | ORAL_TABLET | Freq: Four times a day (QID) | ORAL | Status: DC | PRN
  Filled 2020-08-08: qty 1

## 2020-08-08 MED ORDER — METOPROLOL TARTRATE 5 MG/5ML IV SOLN
2.5000 mg | Freq: Once | INTRAVENOUS | Status: AC
  Administered 2020-08-08: 2.5 mg via INTRAVENOUS
  Filled 2020-08-08: qty 5

## 2020-08-08 MED ORDER — IOHEXOL 350 MG/ML SOLN
75.0000 mL | Freq: Once | INTRAVENOUS | Status: AC | PRN
  Administered 2020-08-08: 75 mL via INTRAVENOUS

## 2020-08-08 MED ORDER — IPRATROPIUM-ALBUTEROL 0.5-2.5 (3) MG/3ML IN SOLN
3.0000 mL | Freq: Four times a day (QID) | RESPIRATORY_TRACT | Status: DC
  Administered 2020-08-08 – 2020-08-10 (×6): 3 mL via RESPIRATORY_TRACT
  Filled 2020-08-08 (×8): qty 3

## 2020-08-08 MED ORDER — METOPROLOL TARTRATE 5 MG/5ML IV SOLN: 5.0000 mg | Freq: Once | INTRAVENOUS | Status: DC

## 2020-08-08 MED ORDER — LORAZEPAM 2 MG/ML IJ SOLN
0.5000 mg | Freq: Once | INTRAMUSCULAR | Status: AC
  Administered 2020-08-08: 0.5 mg via INTRAVENOUS
  Filled 2020-08-08: qty 1

## 2020-08-08 MED ORDER — CHLORHEXIDINE GLUCONATE CLOTH 2 % EX PADS
6.0000 | MEDICATED_PAD | Freq: Every day | CUTANEOUS | Status: DC
  Administered 2020-08-08 – 2020-08-15 (×4): 6 via TOPICAL

## 2020-08-08 MED ORDER — LIDOCAINE 5 % EX PTCH
1.0000 | MEDICATED_PATCH | CUTANEOUS | Status: DC
  Administered 2020-08-08 – 2020-08-14 (×5): 1 via TRANSDERMAL
  Filled 2020-08-08 (×9): qty 1

## 2020-08-08 MED ORDER — FUROSEMIDE 10 MG/ML IJ SOLN
20.0000 mg | Freq: Once | INTRAMUSCULAR | Status: AC
  Administered 2020-08-08: 20 mg via INTRAVENOUS
  Filled 2020-08-08: qty 2

## 2020-08-08 MED ORDER — DILTIAZEM HCL 25 MG/5ML IV SOLN
10.0000 mg | Freq: Four times a day (QID) | INTRAVENOUS | Status: DC
  Administered 2020-08-08 – 2020-08-10 (×9): 10 mg via INTRAVENOUS
  Filled 2020-08-08 (×9): qty 5

## 2020-08-08 NOTE — Progress Notes (Addendum)
Pt again trying t5o pull off her bipap, pulling at lines and thrashing about in bed.  NP notified.  ABG's ordered and Ativan 0.5mg  given IVP.  EKG also ordered.  Mitts placed.

## 2020-08-08 NOTE — Progress Notes (Signed)
PROGRESS NOTE    Jackie Turner  TKP:[id number] DOB: 1936/07/01 DOA: 08/04/2020 PCP: Mar Daring, PA-C   Brief Narrative:  Jackie Turner  is a 84 y.o. Caucasian female with a known history of asthma, dyslipidemia, atrial fibrillation on Eliquis and osteoporosis, who presented to the emergency room with acute onset of likely recurrent melena that she describes as dark stools.  She was started on p.o. Eliquis for atrial fibrillation in early July.  She denies any nausea or vomiting or heartburn or abdominal pain.  She admitted however to dry heaves which has been intermittent lately.  No bright red bleeding per rectum.  No other bleeding diathesis.  No chest pain or dyspnea or cough or wheezing or hemoptysis.  No dysuria, oliguria or hematuria or flank pain.Marland Kitchen  Upon presentation to the emergency room, temperature was 100.2 respiratory rate 25 and blood pressure 145/87 with pulse 79 9% on 4 L of O2 by nasal cannula.  Labs revealed a glucose of 172 and otherwise unremarkable CMP.  CBC showed hemoglobin of 6.3 hematocrit 21.5 compared to 6.4 and 22.1 yesterday and down from 11.4 and 35.8 on 06/22/2020. EKG showed atrial fibrillation with rapid ventricular response of 104.  She was given 40 mg of IV Protonix in the ER.  She will be admitted to a progressive unit bed for further evaluation and management.  From August 05, 2020 till today August 08, 2020 patient was in my care. Initial first 3 days of hospitalization patient was stable day 3 of hospitalization patient was dyspneic, and was managed immediately with IV diuretic therapy with a stat chest x-ray for possible aspiration versus volume overload.  As we diuresed patient she had good output of 1 L with the initial dose of Lasix, chest x-ray showed pulmonary vascular congestion with questionable pneumonia.  She was continued on IV Lasix therapy, evaluation was deferred until patient's pulmonary status was stable and she was not in  respiratory failure.  Stat ABG showed hypercapnia with PCO2 in the 70s, and patient was confused and agitated at that time requiring 24 hours of sedation patient had as needed Haldol and Ativan given to her.  Patient tolerated BiPAP therapy.  Lasix therapy was scheduled and an additional dose of 20 mg was given to her today at noon time.  Since diuretic therapies and IV antibiotic initiation patient has been stable she is currently alert awake oriented patient's decline in acute respiratory failure has been discussed with granddaughter [name] who was here yesterday when patient presented with symptoms of shortness of breath.  Discussed with nurse plan of ongoing BiPAP therapy and repeat ABG.  We will also obtain head CT, CTA to rule out any VTE or new stroke.   CT of the head noncontrast is negative for any acute events.  CTA of the chest is negative for any pulmonary embolism and shows pleural effusion and possible consolidation.  Plan now is to continue IV antibiotic therapy, wean patient off of BiPAP therapy to nasal cannula.  Proceed with GI evaluation will defer to GI for inpatient or outpatient versus condition of the patient as I am going off service.  Overall patient has improved expect discharge in the next 72 hours.  Will obtain physical therapy consult and OT consult.  Granddaughter was interested in case management for options of possible placement.  Assessment & Plan:  1.  GI bleeding with subsequent acute blood loss anemia. -The patient will be admitted to a progressive unit bed. -We will follow  serial hemoglobins and hematocrits. -She was typed and crossmatch and will be transfused 2 units of packed red blood cells that was started in the ER. -Will be placed on IV Protonix bolus and drip. -We will follow posttransfusion H&H. -Eliquis will be held off. -Routine GI consultation will be obtained. -I notified Dr. Bonna Gains about the patient. -Pt is stable and h/h is stable. -diet changed  from regular to clear liquid.  -h/h is stable and EGD once Respiratory failure is resolved.  -No change.  IV PPI is continued.  Discussed with [name] about plan for anemia.  2.  Atrial fibrillation with rapid ventricle response. -This like secondary to her anemia. -We will utilize as needed IV Lopressor for rate control. -Eliquis was held off given GI bleeding. -We will continue Cardizem CD. -Sinus rhythm cont Cardizem. -Patient was in A. fib RVR today morning patient started on IV diltiazem scheduled with as needed parameters to hold, p.o. diltiazem was discontinued.  3.  COPD. -She will be placed on as needed duo nebs. -- Abg shows hypercapnia and pt started on bipap.   4.  DVT prophylaxis. -SCDs. -Medical prophylaxis currently contraindicated due to GI bleeding. --  Consultants:  GI Consult-Dr.Tahliani.  Subjective: Pt seen today and her vitals are stable on current regimen of diltiazem/ ivv ppi.h/h is 9.1. Pt seen today she is very congested as the room temp is over 80 degrees and wants me to turn temp down . Vitals are stable and hr has been steady.Pt has h/o of a.fib  And d/w her about risk of stopping her anticoagulation.   Pt has been stable but states that she has been son since yesterday night. Stat chest xray/ lasix and abg and found that pt has pulmonary vascular congestion ef in her echo two months ago was 60%. And creatinine is wnl. Pt has put out about a 1L with lasix 20 and repeat dose of 4 hour later was given to pt and she was confused and agitated and morphine 17m g and haldol to keep her calm.  Patient was assessed again in the afternoon she is alert, and [name] her granddaughter is at bedside with [name] about volume overload due to IV fluids, however this is not expected but not uncommon in her age and poor nutritional status.  She has heart and kidney function are within normal limits and IV fluid hydration was given to maintain systolic blood pressure to  prevent hypotension to her to her GI bleed.  Verbalized understanding. Patient after being given Haldol was settled a stat ABG showed hypercapnia with PCO2 in the 70s respiratory therapist started patient on BiPAP therapy.  We will continue to monitor and follow patient closely overnight to prevent worsening of her acute hypercapnic respiratory failure.   Vitals:   08/08/20 1345 08/08/20 1426  BP:    Pulse:  (!) 109  Resp:  (!) 23  Temp: 98 F (36.7 C)   SpO2:  97%    Objective: Vitals:   08/08/20 0834 08/08/20 1034 08/08/20 1345 08/08/20 1426  BP:  (!) 146/94    Pulse: (!) 114   (!) 109  Resp: (!) 24   (!) 23  Temp:   98 F (36.7 C)   TempSrc:   Axillary   SpO2: 94% 94%  97%  Weight:      Height:        Intake/Output Summary (Last 24 hours) at 08/08/2020 1550 Last data filed at 08/08/2020 1508 Gross per 24 hour  Intake 395.9 ml  Output 3925 ml  Net -3529.1 ml   Filed Weights   08/05/20 2117 08/07/20 0306 08/08/20 0300  Weight: 55.7 kg 56.9 kg 55.1 kg    Examination: Blood pressure (!) 146/94, pulse (!) 109, temperature 98 F (36.7 C), temperature source Axillary, resp. rate (!) 23, height 4\' 11"  (1.499 m), weight 55.1 kg, SpO2 97 %. General exam: Appears calm and comfortable  Respiratory system: Bilateral crackles, no wheezing.  Cardiovascular system: S1 & S2 heard, RRR. No JVD, murmurs, rubs, gallops or clicks. No pedal edema. Gastrointestinal system: Abdomen is nondistended, soft and nontender. No organomegaly or masses felt. Normal bowel sounds heard. Central nervous system: Cranial nerves grossly intact.  Alert and oriented. No focal neurological deficits. Extremities: Patient moving all 4 extremities is ambulating without assistance. Skin: No rashes, lesions or ulcers Psychiatry: Judgement and insight appear normal. Mood & affect appropriate.   Data Reviewed: I have personally reviewed following labs and imaging studies  CBC: Recent Labs  Lab 08/03/20 1404  08/04/20 1726 08/04/20 1726 08/05/20 1243 08/05/20 1637 08/06/20 1019 08/07/20 0543 08/08/20 0449  WBC 7.2 8.5  --  8.4  --  7.7 8.6 7.3  NEUTROABS 5.0  --   --   --   --  6.0  --  5.2  HGB 6.4* 6.3*   < > 9.4* 9.1* 9.5* 9.1* 8.9*  HCT 22.1* 21.5*   < > 31.8* 30.3* 32.5* 31.9* 30.4*  MCV 76* 77.6*  --  78.9*  --  80.6 82.2 81.1  PLT 338 323  --  272  --  293 318 265   < > = values in this interval not displayed.   Basic Metabolic Panel: Recent Labs  Lab 08/05/20 1243 08/06/20 1019 08/07/20 0543 08/07/20 1522 08/08/20 0449  NA 140 142 144 144 146*  K 4.9 3.8 4.0 3.7 3.3*  CL 97* 102 102 99 99  CO2 33* 30 31 33* 36*  GLUCOSE 96 128* 124* 144* 99  BUN 21 13 12 14 16   CREATININE 0.62 0.69 0.67 0.59 0.64  CALCIUM 8.6* 8.6* 8.8* 8.2* 8.3*  MG  --  1.9 2.0 1.7 2.2  PHOS  --  2.7 2.7  --  2.3*   GFR: Estimated Creatinine Clearance: 41.1 mL/min (by C-G formula based on SCr of 0.64 mg/dL). Liver Function Tests: Recent Labs  Lab 08/03/20 1404 08/04/20 1726 08/06/20 1019 08/08/20 0449  AST 17 20 18 15   ALT 10 14 12 11   ALKPHOS 75 61 60 60  BILITOT 0.2 0.6 0.8 0.7  PROT 6.0 6.5 6.1* 5.8*  ALBUMIN 3.9 3.7 3.5 3.3*   No results for input(s): LIPASE, AMYLASE in the last 168 hours. No results for input(s): AMMONIA in the last 168 hours. Coagulation Profile: Recent Labs  Lab 08/04/20 1726  INR 1.1   Thyroid Function Tests: No results for input(s): TSH, T4TOTAL, FREET4, T3FREE, THYROIDAB in the last 72 hours. Anemia Panel: No results for input(s): VITAMINB12, FOLATE, FERRITIN, TIBC, IRON, RETICCTPCT in the last 72 hours.  Recent Results (from the past 240 hour(s))  SARS Coronavirus 2 by RT PCR (hospital order, performed in [facility] hospital lab) Nasopharyngeal Nasopharyngeal Swab     Status: None   Collection Time: 08/04/20 10:53 PM   Specimen: Nasopharyngeal Swab  Result Value Ref Range Status   SARS Coronavirus 2 NEGATIVE NEGATIVE Final    Comment:  (NOTE) SARS-CoV-2 target nucleic acids are NOT DETECTED.  The SARS-CoV-2 RNA is generally detectable  in upper and lower respiratory specimens during the acute phase of infection. The lowest concentration of SARS-CoV-2 viral copies this assay can detect is 250 copies / mL. A negative result does not preclude SARS-CoV-2 infection and should not be used as the sole basis for treatment or other patient management decisions.  A negative result may occur with improper specimen collection / handling, submission of specimen other than nasopharyngeal swab, presence of viral mutation(s) within the areas targeted by this assay, and inadequate number of viral copies (<250 copies / mL). A negative result must be combined with clinical observations, patient history, and epidemiological information.  Fact Sheet for Patients:   StrictlyIdeas.no  Fact Sheet for Healthcare Providers: BankingDealers.co.za  This test is not yet approved or  cleared by the [place] FDA and has been authorized for detection and/or diagnosis of SARS-CoV-2 by FDA under an Emergency Use Authorization (EUA).  This EUA will remain in effect (meaning this test can be used) for the duration of the COVID-19 declaration under Section 564(b)(1) of the Act, 21 U.S.C. section 360bbb-3(b)(1), unless the authorization is terminated or revoked sooner.  Performed at [facility], [address] [place], [place] [address]      Radiology Studies: CT HEAD WO CONTRAST  Result Date: 08/08/2020 CLINICAL DATA:  Delirium with altered mental status EXAM: CT HEAD WITHOUT CONTRAST TECHNIQUE: Contiguous axial images were obtained from the base of the skull through the vertex without intravenous contrast. COMPARISON:  None. FINDINGS: Brain: There is age related volume loss. There is no intracranial mass, hemorrhage, extra-axial fluid collection, or midline shift. There is slight small  vessel disease in the centra semiovale bilaterally. No acute infarct is evident. Vascular: No hyperdense vessel. There is calcification in each carotid siphon region. Skull: The bony calvarium appears intact. Sinuses/Orbits: There is mucosal thickening in several ethmoid air cells. Other visualized paranasal sinuses are clear. Visualized orbits appear symmetric bilaterally. Other: Mastoid air cells are clear. IMPRESSION: Age related volume loss with mild periventricular small vessel disease. No acute infarct. No mass or hemorrhage. There are foci of arterial vascular calcification. There is mucosal thickening in several ethmoid air cells. Electronically Signed   By: Lowella Grip III M.D.   On: 08/08/2020 13:43   CT ANGIO CHEST PE W OR WO CONTRAST  Result Date: 08/08/2020 CLINICAL DATA:  Chest pain, shortness of breath, pleural effusion, rule out pulmonary embolism EXAM: CT ANGIOGRAPHY CHEST WITH CONTRAST TECHNIQUE: Multidetector CT imaging of the chest was performed using the standard protocol during bolus administration of intravenous contrast. Multiplanar CT image reconstructions and MIPs were obtained to evaluate the vascular anatomy. CONTRAST:  45mL OMNIPAQUE IOHEXOL 350 MG/ML SOLN COMPARISON:  06/12/2020 FINDINGS: Cardiovascular: Satisfactory opacification of the pulmonary arteries to the segmental level. No evidence of pulmonary embolism. Mild cardiomegaly. Trace pericardial effusion. Aortic atherosclerosis Mediastinum/Nodes: No enlarged mediastinal, hilar, or axillary lymph nodes. Thyroid gland, trachea, and esophagus demonstrate no significant findings. Lungs/Pleura: Moderate bilateral pleural effusions and associated atelectasis or consolidation. Redemonstrated dense scarring of the left lung base. Upper Abdomen: No acute abnormality. Musculoskeletal: No chest wall abnormality. Redemonstrated high-grade wedge deformity of the T10 vertebral body. Review of the MIP images confirms the above findings.  IMPRESSION: 1. Negative examination for pulmonary embolism. 2. Moderate bilateral pleural effusions and associated atelectasis or consolidation. 3. Aortic Atherosclerosis (ICD10-I70.0). Electronically Signed   By: Eddie Candle M.D.   On: 08/08/2020 14:13   DG Chest Port 1 View  Result Date: 08/07/2020 CLINICAL DATA:  Increased shortness of breath EXAM: PORTABLE CHEST 1 VIEW COMPARISON:  08/06/2020 FINDINGS: Interval increase in diffuse bilateral interstitial pulmonary opacity. Unchanged elevation of the left hemidiaphragm. Probable small layering bilateral pleural effusions. Mild cardiomegaly. IMPRESSION: Interval increase in diffuse bilateral interstitial pulmonary opacity, which may reflect edema and/or infection. Probable small layering bilateral pleural effusions. Electronically Signed   By: Eddie Candle M.D.   On: 08/07/2020 10:15    Scheduled Meds: . Chlorhexidine Gluconate Cloth  6 each Topical Daily  . diltiazem  10 mg Intravenous Q6H  . doxepin  30 mg Oral QHS  . fluticasone furoate-vilanterol  1 puff Inhalation Daily   And  . umeclidinium bromide  1 puff Inhalation Daily  . furosemide  20 mg Intravenous Q12H  . ipratropium-albuterol  3 mL Nebulization Once  . ipratropium-albuterol  3 mL Nebulization Q6H WA  . mouth rinse  15 mL Mouth Rinse BID  . oxybutynin  15 mg Oral QHS  . pantoprazole (PROTONIX) IV  40 mg Intravenous Q12H  . pentosan polysulfate  100 mg Oral BID   Continuous Infusions: . cefTRIAXone (ROCEPHIN)  IV Stopped (08/08/20 1021)  . metronidazole Stopped (08/08/20 1132)  . potassium PHOSPHATE IVPB (in mmol) 30 mmol (08/08/20 1331)     LOS: 4 days  Para Skeans, MD [facility] Pager [phone] If 7PM-7AM, please contact night-coverage [email or web address] Password [email or web address] 08/08/2020, 3:50 PM

## 2020-08-08 NOTE — Progress Notes (Signed)
Made dr. Posey Pronto aware patient mews score turned yellow. Heart rate ranging 106-119. Patient on scheduled po cardizem however currently npo

## 2020-08-08 NOTE — Progress Notes (Signed)
Vonda Antigua, MD [address] [place], [place], [place], [address] [address], [place], [place], [place], [address] Phone: [phone]  Fax: [phone]   Subjective:  Patient is now on BiPAP.  No evidence of active GI bleeding in 24 to 48 hours  Objective: Exam: Vital signs in last 24 hours: Vitals:   08/08/20 0300 08/08/20 0834 08/08/20 1034 08/08/20 1345  BP: 137/86  (!) 146/94   Pulse:  (!) 114    Resp: 17 (!) 24    Temp: 98 F (36.7 C)   98 F (36.7 C)  TempSrc: Axillary   Axillary  SpO2: 95% 94% 94%   Weight: 55.1 kg     Height:       Weight change: -1.769 kg  Intake/Output Summary (Last 24 hours) at 08/08/2020 1418 Last data filed at 08/08/2020 1255 Gross per 24 hour  Intake 439.62 ml  Output 3925 ml  Net -3485.38 ml    General: On BiPAP, had mittens in place, sitter at bedside Abd: Soft, NT/ND, No HSM Skin: Warm, no rashes Neck: Supple, Trachea midline   Lab Results: Lab Results  Component Value Date   WBC 7.3 08/08/2020   HGB 8.9 (L) 08/08/2020   HCT 30.4 (L) 08/08/2020   MCV 81.1 08/08/2020   PLT 265 08/08/2020   Micro Results: Recent Results (from the past 240 hour(s))  SARS Coronavirus 2 by RT PCR (hospital order, performed in [facility] [place] hospital lab) Nasopharyngeal Nasopharyngeal Swab     Status: None   Collection Time: 08/04/20 10:53 PM   Specimen: Nasopharyngeal Swab  Result Value Ref Range Status   SARS Coronavirus 2 NEGATIVE NEGATIVE Final    Comment: (NOTE) SARS-CoV-2 target nucleic acids are NOT DETECTED.  The SARS-CoV-2 RNA is generally detectable in upper and lower respiratory specimens during the acute phase of infection. The lowest concentration of SARS-CoV-2 viral copies this assay can detect is 250 copies / mL. A negative result does not preclude SARS-CoV-2 infection and should not be used as the sole basis for treatment or other patient management decisions.  A negative result may occur with improper  specimen collection / handling, submission of specimen other than nasopharyngeal swab, presence of viral mutation(s) within the areas targeted by this assay, and inadequate number of viral copies (<250 copies / mL). A negative result must be combined with clinical observations, patient history, and epidemiological information.  Fact Sheet for Patients:   StrictlyIdeas.no  Fact Sheet for Healthcare Providers: BankingDealers.co.za  This test is not yet approved or  cleared by the [place] FDA and has been authorized for detection and/or diagnosis of SARS-CoV-2 by FDA under an Emergency Use Authorization (EUA).  This EUA will remain in effect (meaning this test can be used) for the duration of the COVID-19 declaration under Section 564(b)(1) of the Act, 21 U.S.C. section 360bbb-3(b)(1), unless the authorization is terminated or revoked sooner.  Performed at [facility], [address] [place], [place] [address]    Studies/Results: CT HEAD WO CONTRAST  Result Date: 08/08/2020 CLINICAL DATA:  Delirium with altered mental status EXAM: CT HEAD WITHOUT CONTRAST TECHNIQUE: Contiguous axial images were obtained from the base of the skull through the vertex without intravenous contrast. COMPARISON:  None. FINDINGS: Brain: There is age related volume loss. There is no intracranial mass, hemorrhage, extra-axial fluid collection, or midline shift. There is slight small vessel disease in the centra semiovale bilaterally. No acute infarct is evident. Vascular: No hyperdense vessel. There is calcification  in each carotid siphon region. Skull: The bony calvarium appears intact. Sinuses/Orbits: There is mucosal thickening in several ethmoid air cells. Other visualized paranasal sinuses are clear. Visualized orbits appear symmetric bilaterally. Other: Mastoid air cells are clear. IMPRESSION: Age related volume loss with mild periventricular small  vessel disease. No acute infarct. No mass or hemorrhage. There are foci of arterial vascular calcification. There is mucosal thickening in several ethmoid air cells. Electronically Signed   By: Lowella Grip III M.D.   On: 08/08/2020 13:43   CT ANGIO CHEST PE W OR WO CONTRAST  Result Date: 08/08/2020 CLINICAL DATA:  Chest pain, shortness of breath, pleural effusion, rule out pulmonary embolism EXAM: CT ANGIOGRAPHY CHEST WITH CONTRAST TECHNIQUE: Multidetector CT imaging of the chest was performed using the standard protocol during bolus administration of intravenous contrast. Multiplanar CT image reconstructions and MIPs were obtained to evaluate the vascular anatomy. CONTRAST:  2mL OMNIPAQUE IOHEXOL 350 MG/ML SOLN COMPARISON:  06/12/2020 FINDINGS: Cardiovascular: Satisfactory opacification of the pulmonary arteries to the segmental level. No evidence of pulmonary embolism. Mild cardiomegaly. Trace pericardial effusion. Aortic atherosclerosis Mediastinum/Nodes: No enlarged mediastinal, hilar, or axillary lymph nodes. Thyroid gland, trachea, and esophagus demonstrate no significant findings. Lungs/Pleura: Moderate bilateral pleural effusions and associated atelectasis or consolidation. Redemonstrated dense scarring of the left lung base. Upper Abdomen: No acute abnormality. Musculoskeletal: No chest wall abnormality. Redemonstrated high-grade wedge deformity of the T10 vertebral body. Review of the MIP images confirms the above findings. IMPRESSION: 1. Negative examination for pulmonary embolism. 2. Moderate bilateral pleural effusions and associated atelectasis or consolidation. 3. Aortic Atherosclerosis (ICD10-I70.0). Electronically Signed   By: Eddie Candle M.D.   On: 08/08/2020 14:13   DG Chest Port 1 View  Result Date: 08/07/2020 CLINICAL DATA:  Increased shortness of breath EXAM: PORTABLE CHEST 1 VIEW COMPARISON:  08/06/2020 FINDINGS: Interval increase in diffuse bilateral interstitial pulmonary  opacity. Unchanged elevation of the left hemidiaphragm. Probable small layering bilateral pleural effusions. Mild cardiomegaly. IMPRESSION: Interval increase in diffuse bilateral interstitial pulmonary opacity, which may reflect edema and/or infection. Probable small layering bilateral pleural effusions. Electronically Signed   By: Eddie Candle M.D.   On: 08/07/2020 10:15   Medications:  Scheduled Meds:  Chlorhexidine Gluconate Cloth  6 each Topical Daily   diltiazem  10 mg Intravenous Q6H   doxepin  30 mg Oral QHS   fluticasone furoate-vilanterol  1 puff Inhalation Daily   And   umeclidinium bromide  1 puff Inhalation Daily   furosemide  20 mg Intravenous Q12H   ipratropium-albuterol  3 mL Nebulization Once   ipratropium-albuterol  3 mL Nebulization Q6H WA   mouth rinse  15 mL Mouth Rinse BID   oxybutynin  15 mg Oral QHS   pantoprazole (PROTONIX) IV  40 mg Intravenous Q12H   pentosan polysulfate  100 mg Oral BID   Continuous Infusions:  cefTRIAXone (ROCEPHIN)  IV 2 g (08/08/20 0951)   metronidazole 500 mg (08/08/20 1031)   potassium PHOSPHATE IVPB (in mmol) 30 mmol (08/08/20 1331)   PRN Meds:.acetaminophen **OR** acetaminophen, albuterol, ipratropium-albuterol, LORazepam, morphine injection, ondansetron **OR** ondansetron (ZOFRAN) IV, sodium chloride, traMADol, traZODone   Assessment: Active Problems:   GI bleeding    Plan: No further active GI bleeding We will continue to hold off on procedures at this time given oxygen requirements and BiPAP in place  PPI IV twice daily  Continue serial CBCs and transfuse PRN Avoid NSAIDs Maintain 2 large-bore IV lines Please page GI with any acute hemodynamic  changes, or signs of active GI bleeding  Endoscopic procedures to be done once patient is more stable from dyspnea standpoint.    LOS: 4 days   Vonda Antigua, MD 08/08/2020, 2:18 PM

## 2020-08-09 ENCOUNTER — Institutional Professional Consult (permissible substitution): Payer: Medicare HMO | Admitting: Pulmonary Disease

## 2020-08-09 DIAGNOSIS — D62 Acute posthemorrhagic anemia: Secondary | ICD-10-CM | POA: Diagnosis present

## 2020-08-09 DIAGNOSIS — J9602 Acute respiratory failure with hypercapnia: Secondary | ICD-10-CM | POA: Diagnosis present

## 2020-08-09 DIAGNOSIS — J9621 Acute and chronic respiratory failure with hypoxia: Secondary | ICD-10-CM | POA: Diagnosis present

## 2020-08-09 DIAGNOSIS — J9601 Acute respiratory failure with hypoxia: Secondary | ICD-10-CM | POA: Diagnosis present

## 2020-08-09 LAB — CBC
HCT: 33.2 % — ABNORMAL LOW (ref 36.0–46.0)
Hemoglobin: 10.3 g/dL — ABNORMAL LOW (ref 12.0–15.0)
MCH: 23.8 pg — ABNORMAL LOW (ref 26.0–34.0)
MCHC: 31 g/dL (ref 30.0–36.0)
MCV: 76.9 fL — ABNORMAL LOW (ref 80.0–100.0)
Platelets: 307 10*3/uL (ref 150–400)
RBC: 4.32 MIL/uL (ref 3.87–5.11)
RDW: 19.3 % — ABNORMAL HIGH (ref 11.5–15.5)
WBC: 7.8 10*3/uL (ref 4.0–10.5)
nRBC: 0 % (ref 0.0–0.2)

## 2020-08-09 LAB — COMPREHENSIVE METABOLIC PANEL
ALT: 14 U/L (ref 0–44)
AST: 18 U/L (ref 15–41)
Albumin: 3.4 g/dL — ABNORMAL LOW (ref 3.5–5.0)
Alkaline Phosphatase: 62 U/L (ref 38–126)
Anion gap: 16 — ABNORMAL HIGH (ref 5–15)
BUN: 19 mg/dL (ref 8–23)
CO2: 38 mmol/L — ABNORMAL HIGH (ref 22–32)
Calcium: 8.8 mg/dL — ABNORMAL LOW (ref 8.9–10.3)
Chloride: 90 mmol/L — ABNORMAL LOW (ref 98–111)
Creatinine, Ser: 0.83 mg/dL (ref 0.44–1.00)
GFR calc Af Amer: >60 mL/min (ref >60)
GFR calc non Af Amer: >60 mL/min (ref >60)
Glucose, Bld: 102 mg/dL — ABNORMAL HIGH (ref 70–99)
Potassium: 3.1 mmol/L — ABNORMAL LOW (ref 3.5–5.1)
Sodium: 144 mmol/L (ref 135–145)
Total Bilirubin: 1.8 mg/dL — ABNORMAL HIGH (ref 0.3–1.2)
Total Protein: 5.8 g/dL — ABNORMAL LOW (ref 6.5–8.1)

## 2020-08-09 LAB — MAGNESIUM: Magnesium: 1.8 mg/dL (ref 1.7–2.4)

## 2020-08-09 MED ORDER — POTASSIUM CHLORIDE CRYS ER 20 MEQ PO TBCR
40.0000 meq | EXTENDED_RELEASE_TABLET | ORAL | Status: AC
  Administered 2020-08-09 (×2): 40 meq via ORAL
  Filled 2020-08-09 (×2): qty 2

## 2020-08-09 MED ORDER — SODIUM CHLORIDE 0.9 % IV SOLN
INTRAVENOUS | Status: DC | PRN
  Administered 2020-08-09 – 2020-08-10 (×2): 500 mL via INTRAVENOUS

## 2020-08-09 MED ORDER — METOPROLOL TARTRATE 5 MG/5ML IV SOLN
5.0000 mg | INTRAVENOUS | Status: DC | PRN
  Administered 2020-08-09 – 2020-08-10 (×3): 5 mg via INTRAVENOUS
  Filled 2020-08-09 (×3): qty 5

## 2020-08-09 MED ORDER — MAGNESIUM SULFATE 2 GM/50ML IV SOLN
2.0000 g | Freq: Once | INTRAVENOUS | Status: AC
  Administered 2020-08-09: 2 g via INTRAVENOUS
  Filled 2020-08-09: qty 50

## 2020-08-09 NOTE — Progress Notes (Signed)
Vonda Antigua, MD [address], [place], [place], [place], [address] [address], [place], [place], [place], [address] Phone: [phone]  Fax: [phone]   Subjective:  Patient remains on BiPAP.  Remains somnolent.  No active GI bleeding except  Objective: Exam: Vital signs in last 24 hours: Vitals:   08/09/20 0928 08/09/20 1056 08/09/20 1110 08/09/20 1155  BP:  (!) 124/91 131/81 128/84  Pulse:  (!) 122 (!) 108 92  Resp:   (!) 22 17  Temp:    97.8 F (36.6 C)  TempSrc:    Oral  SpO2: 95%  97% 97%  Weight:      Height:       Weight change:   Intake/Output Summary (Last 24 hours) at 08/09/2020 1219 Last data filed at 08/09/2020 1134 Gross per 24 hour  Intake 767.84 ml  Output 4100 ml  Net -3332.16 ml    General: No acute distress, AAO x3 Abd: Soft, NT/ND, No HSM Skin: Warm, no rashes Neck: Supple, Trachea midline   Lab Results: Lab Results  Component Value Date   WBC 7.8 08/09/2020   HGB 10.3 (L) 08/09/2020   HCT 33.2 (L) 08/09/2020   MCV 76.9 (L) 08/09/2020   PLT 307 08/09/2020   Micro Results: Recent Results (from the past 240 hour(s))  SARS Coronavirus 2 by RT PCR (hospital order, performed in [place] hospital lab) Nasopharyngeal Nasopharyngeal Swab     Status: None   Collection Time: 08/04/20 10:53 PM   Specimen: Nasopharyngeal Swab  Result Value Ref Range Status   SARS Coronavirus 2 NEGATIVE NEGATIVE Final    Comment: (NOTE) SARS-CoV-2 target nucleic acids are NOT DETECTED.  The SARS-CoV-2 RNA is generally detectable in upper and lower respiratory specimens during the acute phase of infection. The lowest concentration of SARS-CoV-2 viral copies this assay can detect is 250 copies / mL. A negative result does not preclude SARS-CoV-2 infection and should not be used as the sole basis for treatment or other patient management decisions.  A negative result may occur with improper specimen collection / handling, submission of specimen  other than nasopharyngeal swab, presence of viral mutation(s) within the areas targeted by this assay, and inadequate number of viral copies (<250 copies / mL). A negative result must be combined with clinical observations, patient history, and epidemiological information.  Fact Sheet for Patients:   StrictlyIdeas.no  Fact Sheet for Healthcare Providers: BankingDealers.co.za  This test is not yet approved or  cleared by the [place] FDA and has been authorized for detection and/or diagnosis of SARS-CoV-2 by FDA under an Emergency Use Authorization (EUA).  This EUA will remain in effect (meaning this test can be used) for the duration of the COVID-19 declaration under Section 564(b)(1) of the Act, 21 U.S.C. section 360bbb-3(b)(1), unless the authorization is terminated or revoked sooner.  Performed at [facility], [address] [place], [place] [address]    Studies/Results: CT HEAD WO CONTRAST  Result Date: 08/08/2020 CLINICAL DATA:  Delirium with altered mental status EXAM: CT HEAD WITHOUT CONTRAST TECHNIQUE: Contiguous axial images were obtained from the base of the skull through the vertex without intravenous contrast. COMPARISON:  None. FINDINGS: Brain: There is age related volume loss. There is no intracranial mass, hemorrhage, extra-axial fluid collection, or midline shift. There is slight small vessel disease in the centra semiovale bilaterally. No acute infarct is evident. Vascular: No hyperdense vessel. There is calcification in each carotid siphon region. Skull: The bony calvarium appears intact.  Sinuses/Orbits: There is mucosal thickening in several ethmoid air cells. Other visualized paranasal sinuses are clear. Visualized orbits appear symmetric bilaterally. Other: Mastoid air cells are clear. IMPRESSION: Age related volume loss with mild periventricular small vessel disease. No acute infarct. No mass or  hemorrhage. There are foci of arterial vascular calcification. There is mucosal thickening in several ethmoid air cells. Electronically Signed   By: Lowella Grip III M.D.   On: 08/08/2020 13:43   CT ANGIO CHEST PE W OR WO CONTRAST  Result Date: 08/08/2020 CLINICAL DATA:  Chest pain, shortness of breath, pleural effusion, rule out pulmonary embolism EXAM: CT ANGIOGRAPHY CHEST WITH CONTRAST TECHNIQUE: Multidetector CT imaging of the chest was performed using the standard protocol during bolus administration of intravenous contrast. Multiplanar CT image reconstructions and MIPs were obtained to evaluate the vascular anatomy. CONTRAST:  59mL OMNIPAQUE IOHEXOL 350 MG/ML SOLN COMPARISON:  06/12/2020 FINDINGS: Cardiovascular: Satisfactory opacification of the pulmonary arteries to the segmental level. No evidence of pulmonary embolism. Mild cardiomegaly. Trace pericardial effusion. Aortic atherosclerosis Mediastinum/Nodes: No enlarged mediastinal, hilar, or axillary lymph nodes. Thyroid gland, trachea, and esophagus demonstrate no significant findings. Lungs/Pleura: Moderate bilateral pleural effusions and associated atelectasis or consolidation. Redemonstrated dense scarring of the left lung base. Upper Abdomen: No acute abnormality. Musculoskeletal: No chest wall abnormality. Redemonstrated high-grade wedge deformity of the T10 vertebral body. Review of the MIP images confirms the above findings. IMPRESSION: 1. Negative examination for pulmonary embolism. 2. Moderate bilateral pleural effusions and associated atelectasis or consolidation. 3. Aortic Atherosclerosis (ICD10-I70.0). Electronically Signed   By: Eddie Candle M.D.   On: 08/08/2020 14:13   Medications:  Scheduled Meds: . Chlorhexidine Gluconate Cloth  6 each Topical Daily  . diltiazem  10 mg Intravenous Q6H  . doxepin  30 mg Oral QHS  . fluticasone furoate-vilanterol  1 puff Inhalation Daily   And  . umeclidinium bromide  1 puff Inhalation  Daily  . furosemide  20 mg Intravenous Q12H  . ipratropium-albuterol  3 mL Nebulization Once  . ipratropium-albuterol  3 mL Nebulization Q6H WA  . lidocaine  1 patch Transdermal Q24H  . mouth rinse  15 mL Mouth Rinse BID  . oxybutynin  15 mg Oral QHS  . pantoprazole (PROTONIX) IV  40 mg Intravenous Q12H  . pentosan polysulfate  100 mg Oral BID  . potassium chloride  40 mEq Oral Q4H   Continuous Infusions: . sodium chloride Stopped (08/09/20 0240)  . cefTRIAXone (ROCEPHIN)  IV 2 g (08/09/20 0841)  . magnesium sulfate bolus IVPB 2 g (08/09/20 1124)  . metronidazole 500 mg (08/09/20 1005)   PRN Meds:.sodium chloride, acetaminophen **OR** acetaminophen, albuterol, ipratropium-albuterol, LORazepam, metoprolol tartrate, morphine injection, ondansetron **OR** ondansetron (ZOFRAN) IV, sodium chloride, traMADol, traZODone   Assessment: Active Problems:   GI bleeding    Plan: Hemoglobin remains stable Given continued BiPAP requirements, upper endoscopy remains high risk at this time Continue conservative management with PPI twice daily Serial CBCs and transfuse. Please page GI with any signs of active GI bleeding   LOS: 5 days   Vonda Antigua, MD 08/09/2020, 12:19 PM

## 2020-08-09 NOTE — Progress Notes (Signed)
   08/09/20 2025  Assess: MEWS Score  Temp 98.3 F (36.8 C)  BP (!) 142/90  Pulse Rate (!) 113  ECG Heart Rate (!) 139  Resp (!) 24  Level of Consciousness Alert  SpO2 94 %  Assess: MEWS Score  MEWS Temp 0  MEWS Systolic 0  MEWS Pulse 3  MEWS RR 1  MEWS LOC 0  MEWS Score 4  MEWS Score Color Red  Assess: if the MEWS score is Yellow or Red  Were vital signs taken at a resting state? Yes  Focused Assessment No change from prior assessment  Early Detection of Sepsis Score *See Row Information* Low  MEWS guidelines implemented *See Row Information* Yes  Treat  MEWS Interventions Administered prn meds/treatments  Pain Scale 0-10  Pain Score 0  Take Vital Signs  Increase Vital Sign Frequency  Red: Q 1hr X 4 then Q 4hr X 4, if remains red, continue Q 4hrs  Document  Patient Outcome Stabilized after interventions  Progress note created (see row info) Yes

## 2020-08-09 NOTE — Progress Notes (Addendum)
PROGRESS NOTE    Lester Platas   HEN:[id number]  DOB: [birth year]  PCP: Mar Daring, PA-C    DOA: 08/04/2020 LOS: 5   Brief Narrative   Admission records reviewed and summarized:  Meena Barrantes  is a 84 y.o. female with a known history of asthma, dyslipidemia, atrial fibrillation on Eliquis and osteoporosis, who presented to the emergency room with recurrent dark stools, having recently been started on Eliquis for A-fib in early July.    In the ED, temperature was 100.2, RR 25, BP 145/87 with HR 79, and O2 sat  90's% on 4 L nasal cannula oxygen.  CMP was unremarkable aside from glucose of 172.  CBC showed Hbg 6.3, Hct 21.5 (down from 11.4 and 35.8 on 06/22/2020). EKG showed A-fib with RVR at 104 bpm.  Treated with IV Protonix in the ED and admitted to hospitalist service with GI consulted for evaluation of GI bleeding.  Per Dr. Esmeralda Links Patel's note on 08/08/20:  "From August 05, 2020 till August 08, 2020 patient was in my care. Initial first 3 days of hospitalization patient was stable day 3 of hospitalization patient was dyspneic, and was managed immediately with IV diuretic therapy with a stat chest x-ray for possible aspiration versus volume overload.  As we diuresed patient she had good output of 1 L with the initial dose of Lasix, chest x-ray showed pulmonary vascular congestion with questionable pneumonia.  She was continued on IV Lasix therapy, evaluation was deferred until patient's pulmonary status was stable and she was not in respiratory failure.  Stat ABG showed hypercapnia with PCO2 in the 70s, and patient was confused and agitated at that time requiring 24 hours of sedation patient had as needed Haldol and Ativan given to her.  Patient tolerated BiPAP therapy.  Lasix therapy was scheduled and an additional dose of 20 mg was given to her today at noon time.  Since diuretic therapies and IV antibiotic initiation patient has been stable she is currently alert awake oriented  patient's decline in acute respiratory failure has been discussed with granddaughter [name] who was here yesterday when patient presented with symptoms of shortness of breath.  Discussed with nurse plan of ongoing BiPAP therapy and repeat ABG.  We will also obtain head CT, CTA to rule out any VTE or new stroke.   CT of the head noncontrast is negative for any acute events.  CTA of the chest is negative for any pulmonary embolism and shows pleural effusion and possible consolidation.  Plan now is to continue IV antibiotic therapy, wean patient off of BiPAP therapy to nasal cannula.  Proceed with GI evaluation will defer to GI for inpatient or outpatient versus condition of the patient as I am going off service.  Overall patient has improved expect discharge in the next 72 hours.  Will obtain physical therapy consult and OT consult.  Granddaughter was interested in case management for options of possible placement."       Assessment & Plan   Principal Problem:   GI bleeding Active Problems:   Atrial fibrillation with RVR (HCC)   Acute respiratory failure with hypoxia and hypercapnia (HCC)   Acute blood loss anemia   COPD (chronic obstructive pulmonary disease) (HCC)   IBS (irritable bowel syndrome)   GI bleeding with Acute Blood Loss Anemia - POA.  GI following and plans for EGD, however respiratory status needs improvement to reduce risk.  Hold Eliquis.  Serial H&H's.  Transfuse in Hbg < 7.0.  (  Has received 2 units RBC's so far).  Continue IV Protonix BID for now.   Page GI if any signs of active bleeding.   Hypokalemia -  K 3.1 this AM, replaced.  Monitor and replace as needed.   Atrial fibrillation with RVR - most likely due to anemia.  Eliquis held as above.  IV Lopressor PRN for rate control.  Continue scheduled IV Cardizem (while oral on hold).  Maintain K>4.0 and Mg >2.0  Acute respiratory failure with hypoxia and hypercapnia - secondary to COPD, CHF and possibly PNA given possible  consolidation on imaging.  Continue Bipap nightly and empiric Rocephin.  Supplemental O2 to keep O2 sat > 90%.  Acute on Chronic Diastolic CHF - last echo June 2021 showed EF 60-65% with grade 1 diastolic dysfunction.  Continue Lasix 20 mg IV BID.  Monitor I/O's and daily weights.    Community-Acquired Pneumonia (vs aspiration pneumonitis) - POA.  Has been getting Rocephin and Flagyl (anaerobic coverage no longer recommended - will stop).  Continue Rocephin.  Monitor clinically.  COPD - not acutely exacerbated.  Duonebs scheduled and PRN.  Bipap as above.  IBS with constipation - chronic, patient says has not had any constipation since starting Eliquis in July.  Monitor.    DVT prophylaxis: SCDs Start: 08/04/20 2314   Diet:  Diet Orders (From admission, onward)    Start     Ordered   08/07/20 1426  Diet NPO time specified  Diet effective now        08/07/20 1432            Code Status: Full Code    Subjective 08/09/20    Patient seen with granddaughter/caregiver at bedside this AM.  She reports feeling very tired.  Typically is constipated, has IBS-C, but says no constipation since July admission when she started Eliquis.  Denies fever/chills, abdominal pain, N/V/D or other acute complaints.     Disposition Plan & Communication   Status is: Inpatient  Remains inpatient appropriate because:Inpatient level of care appropriate due to severity of illness   Dispo: The patient is from: Home              Anticipated d/c is to: Home vs SNF pending PT evaluation when more stable              Anticipated d/c date is: 3 days              Patient currently is not medically stable to d/c.        Family Communication: granddaughter at bedside during encounter    Consults, Procedures, Significant Events   Consultants:   Gastroenterology  Procedures:   None  Antimicrobials:   Rocephin    Objective   Vitals:   08/09/20 1056 08/09/20 1110 08/09/20 1155 08/09/20 1413    BP: (!) 124/91 131/81 128/84   Pulse: (!) 122 (!) 108 92   Resp:  (!) 22 17   Temp:   97.8 F (36.6 C)   TempSrc:   Oral   SpO2:  97% 97% 96%  Weight:      Height:        Intake/Output Summary (Last 24 hours) at 08/09/2020 1554 Last data filed at 08/09/2020 1134 Gross per 24 hour  Intake 567.84 ml  Output 3000 ml  Net -2432.16 ml   Filed Weights   08/05/20 2117 08/07/20 0306 08/08/20 0300  Weight: 55.7 kg 56.9 kg 55.1 kg    Physical Exam:  General  exam: awake, alert, no acute distress, mildly ill appearing Respiratory system: CTAB but diminished, no wheezes or rhonchi, normal respiratory effort. Cardiovascular system: normal S1/S2, irregular rate, regular rhythm, no pedal edema.   Gastrointestinal system: soft, NT, ND, no HSM felt, +bowel sounds. Central nervous system: A&O x3. no gross focal neurologic deficits, normal speech Extremities: moves all, no cyanosis, normal tone Skin: dry, intact, normal temperature Psychiatry: normal mood, congruent affect  Labs   Data Reviewed: I have personally reviewed following labs and imaging studies  CBC: Recent Labs  Lab 08/03/20 1404 08/04/20 1726 08/05/20 1243 08/05/20 1243 08/05/20 1637 08/06/20 1019 08/07/20 0543 08/08/20 0449 08/09/20 0816  WBC 7.2   < > 8.4  --   --  7.7 8.6 7.3 7.8  NEUTROABS 5.0  --   --   --   --  6.0  --  5.2  --   HGB 6.4*   < > 9.4*   < > 9.1* 9.5* 9.1* 8.9* 10.3*  HCT 22.1*   < > 31.8*   < > 30.3* 32.5* 31.9* 30.4* 33.2*  MCV 76*   < > 78.9*  --   --  80.6 82.2 81.1 76.9*  PLT 338   < > 272  --   --  293 318 265 307   < > = values in this interval not displayed.   Basic Metabolic Panel: Recent Labs  Lab 08/06/20 1019 08/07/20 0543 08/07/20 1522 08/08/20 0449 08/09/20 0816  NA 142 144 144 146* 144  K 3.8 4.0 3.7 3.3* 3.1*  CL 102 102 99 99 90*  CO2 30 31 33* 36* 38*  GLUCOSE 128* 124* 144* 99 102*  BUN 13 12 14 16 19   CREATININE 0.69 0.67 0.59 0.64 0.83  CALCIUM 8.6* 8.8* 8.2*  8.3* 8.8*  MG 1.9 2.0 1.7 2.2 1.8  PHOS 2.7 2.7  --  2.3*  --    GFR: Estimated Creatinine Clearance: 39.6 mL/min (by C-G formula based on SCr of 0.83 mg/dL). Liver Function Tests: Recent Labs  Lab 08/03/20 1404 08/04/20 1726 08/06/20 1019 08/08/20 0449 08/09/20 0816  AST 17 20 18 15 18   ALT 10 14 12 11 14   ALKPHOS 75 61 60 60 62  BILITOT 0.2 0.6 0.8 0.7 1.8*  PROT 6.0 6.5 6.1* 5.8* 5.8*  ALBUMIN 3.9 3.7 3.5 3.3* 3.4*   No results for input(s): LIPASE, AMYLASE in the last 168 hours. No results for input(s): AMMONIA in the last 168 hours. Coagulation Profile: Recent Labs  Lab 08/04/20 1726  INR 1.1   Cardiac Enzymes: No results for input(s): CKTOTAL, CKMB, CKMBINDEX, TROPONINI in the last 168 hours. BNP (last 3 results) No results for input(s): PROBNP in the last 8760 hours. HbA1C: No results for input(s): HGBA1C in the last 72 hours. CBG: Recent Labs  Lab 08/05/20 2204 08/06/20 0806 08/06/20 1203 08/06/20 1657 08/06/20 2153  GLUCAP 103* 102* 100* 110* 82   Lipid Profile: No results for input(s): CHOL, HDL, LDLCALC, TRIG, CHOLHDL, LDLDIRECT in the last 72 hours. Thyroid Function Tests: No results for input(s): TSH, T4TOTAL, FREET4, T3FREE, THYROIDAB in the last 72 hours. Anemia Panel: No results for input(s): VITAMINB12, FOLATE, FERRITIN, TIBC, IRON, RETICCTPCT in the last 72 hours. Sepsis Labs: No results for input(s): PROCALCITON, LATICACIDVEN in the last 168 hours.  Recent Results (from the past 240 hour(s))  SARS Coronavirus 2 by RT PCR (hospital order, performed in [facility] hospital lab) Nasopharyngeal Nasopharyngeal Swab     Status: None  Collection Time: 08/04/20 10:53 PM   Specimen: Nasopharyngeal Swab  Result Value Ref Range Status   SARS Coronavirus 2 NEGATIVE NEGATIVE Final    Comment: (NOTE) SARS-CoV-2 target nucleic acids are NOT DETECTED.  The SARS-CoV-2 RNA is generally detectable in upper and lower respiratory specimens during the  acute phase of infection. The lowest concentration of SARS-CoV-2 viral copies this assay can detect is 250 copies / mL. A negative result does not preclude SARS-CoV-2 infection and should not be used as the sole basis for treatment or other patient management decisions.  A negative result may occur with improper specimen collection / handling, submission of specimen other than nasopharyngeal swab, presence of viral mutation(s) within the areas targeted by this assay, and inadequate number of viral copies (<250 copies / mL). A negative result must be combined with clinical observations, patient history, and epidemiological information.  Fact Sheet for Patients:   StrictlyIdeas.no  Fact Sheet for Healthcare Providers: BankingDealers.co.za  This test is not yet approved or  cleared by the [place] FDA and has been authorized for detection and/or diagnosis of SARS-CoV-2 by FDA under an Emergency Use Authorization (EUA).  This EUA will remain in effect (meaning this test can be used) for the duration of the COVID-19 declaration under Section 564(b)(1) of the Act, 21 U.S.C. section 360bbb-3(b)(1), unless the authorization is terminated or revoked sooner.  Performed at [facility], [address] [place], [place] [address]       Imaging Studies   CT HEAD WO CONTRAST  Result Date: 08/08/2020 CLINICAL DATA:  Delirium with altered mental status EXAM: CT HEAD WITHOUT CONTRAST TECHNIQUE: Contiguous axial images were obtained from the base of the skull through the vertex without intravenous contrast. COMPARISON:  None. FINDINGS: Brain: There is age related volume loss. There is no intracranial mass, hemorrhage, extra-axial fluid collection, or midline shift. There is slight small vessel disease in the centra semiovale bilaterally. No acute infarct is evident. Vascular: No hyperdense vessel. There is calcification in each carotid  siphon region. Skull: The bony calvarium appears intact. Sinuses/Orbits: There is mucosal thickening in several ethmoid air cells. Other visualized paranasal sinuses are clear. Visualized orbits appear symmetric bilaterally. Other: Mastoid air cells are clear. IMPRESSION: Age related volume loss with mild periventricular small vessel disease. No acute infarct. No mass or hemorrhage. There are foci of arterial vascular calcification. There is mucosal thickening in several ethmoid air cells. Electronically Signed   By: Lowella Grip III M.D.   On: 08/08/2020 13:43   CT ANGIO CHEST PE W OR WO CONTRAST  Result Date: 08/08/2020 CLINICAL DATA:  Chest pain, shortness of breath, pleural effusion, rule out pulmonary embolism EXAM: CT ANGIOGRAPHY CHEST WITH CONTRAST TECHNIQUE: Multidetector CT imaging of the chest was performed using the standard protocol during bolus administration of intravenous contrast. Multiplanar CT image reconstructions and MIPs were obtained to evaluate the vascular anatomy. CONTRAST:  76mL OMNIPAQUE IOHEXOL 350 MG/ML SOLN COMPARISON:  06/12/2020 FINDINGS: Cardiovascular: Satisfactory opacification of the pulmonary arteries to the segmental level. No evidence of pulmonary embolism. Mild cardiomegaly. Trace pericardial effusion. Aortic atherosclerosis Mediastinum/Nodes: No enlarged mediastinal, hilar, or axillary lymph nodes. Thyroid gland, trachea, and esophagus demonstrate no significant findings. Lungs/Pleura: Moderate bilateral pleural effusions and associated atelectasis or consolidation. Redemonstrated dense scarring of the left lung base. Upper Abdomen: No acute abnormality. Musculoskeletal: No chest wall abnormality. Redemonstrated high-grade wedge deformity of the T10 vertebral body. Review of the MIP images confirms the above findings. IMPRESSION: 1. Negative  examination for pulmonary embolism. 2. Moderate bilateral pleural effusions and associated atelectasis or consolidation. 3.  Aortic Atherosclerosis (ICD10-I70.0). Electronically Signed   By: Eddie Candle M.D.   On: 08/08/2020 14:13     Medications   Scheduled Meds: . Chlorhexidine Gluconate Cloth  6 each Topical Daily  . diltiazem  10 mg Intravenous Q6H  . doxepin  30 mg Oral QHS  . fluticasone furoate-vilanterol  1 puff Inhalation Daily   And  . umeclidinium bromide  1 puff Inhalation Daily  . furosemide  20 mg Intravenous Q12H  . ipratropium-albuterol  3 mL Nebulization Once  . ipratropium-albuterol  3 mL Nebulization Q6H WA  . lidocaine  1 patch Transdermal Q24H  . mouth rinse  15 mL Mouth Rinse BID  . oxybutynin  15 mg Oral QHS  . pantoprazole (PROTONIX) IV  40 mg Intravenous Q12H  . pentosan polysulfate  100 mg Oral BID  . potassium chloride  40 mEq Oral Q4H   Continuous Infusions: . sodium chloride Stopped (08/09/20 0240)  . cefTRIAXone (ROCEPHIN)  IV 2 g (08/09/20 0841)  . metronidazole 500 mg (08/09/20 1005)       LOS: 5 days    Time spent: 30 minutes    Ezekiel Slocumb, DO [facility]  08/09/2020, 3:54 PM    If 7PM-7AM, please contact night-coverage. How to contact the [facility] Attending or Consulting provider [place] or covering provider during after hours [place], for this patient?    1. Check the care team in [facility] and look for a) attending/consulting [facility] provider listed and b) the [facility] team listed 2. Log into [email or web address] and use [place]'s universal password to access. If you do not have the password, please contact the hospital operator. 3. Locate the [facility] provider you are looking for under [facility] and page to a number that you can be directly reached. 4. If you still have difficulty reaching the provider, please page the [facility] (Director on Call) for the Hospitalists listed on amion for assistance.

## 2020-08-09 NOTE — Plan of Care (Signed)
  Problem: Education: Goal: Knowledge of General Education information will improve Description: Including pain rating scale, medication(s)/side effects and non-pharmacologic comfort measures Outcome: Progressing   Problem: Education: Goal: Ability to identify signs and symptoms of gastrointestinal bleeding will improve Outcome: Progressing   Problem: Bowel/Gastric: Goal: Will show no signs and symptoms of gastrointestinal bleeding Outcome: Progressing   Problem: Fluid Volume: Goal: Will show no signs and symptoms of excessive bleeding Outcome: Progressing

## 2020-08-09 NOTE — Hospital Course (Addendum)
Jackie Turner  is a 84 y.o. female with a history of asthma, dyslipidemia, atrial fibrillation on Eliquis and osteoporosis, who presented to the ED on  08/04/20 with recurrent dark stools, having recently been started on Eliquis for A-fib in early July.    In the ED, temperature was 100.2, RR 25, BP 145/87 with HR 79, and O2 sat  90's% on 4 L nasal cannula oxygen.  CMP was unremarkable aside from glucose of 172.  CBC showed Hbg 6.3, Hct 21.5 (down from 11.4 and 35.8 on 06/22/2020). EKG showed A-fib with RVR at 104 bpm.  Treated with IV Protonix in the ED and admitted to hospitalist service with GI consulted for evaluation of GI bleeding.   From GI standpoint, endoscopic evaluation was delayed by patient's respiratory issues.  Her bleeding stopped once Eliquis was held, has not recurred since admission.  Hemoglobin relative stable.  No bleeding source was found on EGD.  Colonoscopy planned for 8/30 (she was unable to drink GoLytely, so alternate prep being given and procedure tmrw).   From cardiac and respiratory standpoint, she clinically improved with IV diuresis and has been transitioned to oral Lasix.  She was treated for possible pneumonia with 4 days of IV antibiotics.  Imaging and history reviewed, since patient remained afebrile without respiratory complaints after diuresed, antibiotics were stopped.  Clinically remains stable and improved, on her baseline home O2.  Patient did require Bipap due to hypercapnia with pCO2 in 70's and concurrent confusion and agitation.  This has also resolved.

## 2020-08-10 ENCOUNTER — Inpatient Hospital Stay: Payer: Medicare HMO

## 2020-08-10 DIAGNOSIS — R0902 Hypoxemia: Secondary | ICD-10-CM

## 2020-08-10 LAB — COMPREHENSIVE METABOLIC PANEL
ALT: 13 U/L (ref 0–44)
AST: 18 U/L (ref 15–41)
Albumin: 3.6 g/dL (ref 3.5–5.0)
Alkaline Phosphatase: 59 U/L (ref 38–126)
Anion gap: 17 — ABNORMAL HIGH (ref 5–15)
BUN: 28 mg/dL — ABNORMAL HIGH (ref 8–23)
CO2: 33 mmol/L — ABNORMAL HIGH (ref 22–32)
Calcium: 9.1 mg/dL (ref 8.9–10.3)
Chloride: 96 mmol/L — ABNORMAL LOW (ref 98–111)
Creatinine, Ser: 0.79 mg/dL (ref 0.44–1.00)
GFR calc Af Amer: >60 mL/min (ref >60)
GFR calc non Af Amer: >60 mL/min (ref >60)
Glucose, Bld: 108 mg/dL — ABNORMAL HIGH (ref 70–99)
Potassium: 4.1 mmol/L (ref 3.5–5.1)
Sodium: 146 mmol/L — ABNORMAL HIGH (ref 135–145)
Total Bilirubin: 1.5 mg/dL — ABNORMAL HIGH (ref 0.3–1.2)
Total Protein: 6.3 g/dL — ABNORMAL LOW (ref 6.5–8.1)

## 2020-08-10 LAB — BLOOD GAS, VENOUS
Acid-Base Excess: 16.4 mmol/L — ABNORMAL HIGH (ref 0.0–2.0)
Bicarbonate: 42.7 mmol/L — ABNORMAL HIGH (ref 20.0–28.0)
FIO2: 0.28
O2 Saturation: 77 %
Patient temperature: 37
pCO2, Ven: 60 mmHg (ref 44.0–60.0)
pH, Ven: 7.46 — ABNORMAL HIGH (ref 7.250–7.430)
pO2, Ven: 39 mmHg (ref 32.0–45.0)

## 2020-08-10 LAB — CBC
HCT: 34.9 % — ABNORMAL LOW (ref 36.0–46.0)
Hemoglobin: 10.6 g/dL — ABNORMAL LOW (ref 12.0–15.0)
MCH: 23.8 pg — ABNORMAL LOW (ref 26.0–34.0)
MCHC: 30.4 g/dL (ref 30.0–36.0)
MCV: 78.3 fL — ABNORMAL LOW (ref 80.0–100.0)
Platelets: 331 10*3/uL (ref 150–400)
RBC: 4.46 MIL/uL (ref 3.87–5.11)
RDW: 19.6 % — ABNORMAL HIGH (ref 11.5–15.5)
WBC: 7.4 10*3/uL (ref 4.0–10.5)
nRBC: 0 % (ref 0.0–0.2)

## 2020-08-10 LAB — PROCALCITONIN: Procalcitonin: <0.1 ng/mL

## 2020-08-10 LAB — BRAIN NATRIURETIC PEPTIDE: B Natriuretic Peptide: 249.7 pg/mL — ABNORMAL HIGH (ref 0.0–100.0)

## 2020-08-10 MED ORDER — DILTIAZEM HCL ER COATED BEADS 120 MG PO CP24
240.0000 mg | ORAL_CAPSULE | Freq: Every day | ORAL | Status: DC
  Administered 2020-08-10 – 2020-08-12 (×3): 240 mg via ORAL
  Filled 2020-08-10 (×4): qty 2

## 2020-08-10 MED ORDER — IPRATROPIUM-ALBUTEROL 0.5-2.5 (3) MG/3ML IN SOLN
3.0000 mL | Freq: Three times a day (TID) | RESPIRATORY_TRACT | Status: DC
  Administered 2020-08-11 – 2020-08-15 (×13): 3 mL via RESPIRATORY_TRACT
  Filled 2020-08-10 (×15): qty 3

## 2020-08-10 MED ORDER — FUROSEMIDE 10 MG/ML IJ SOLN
40.0000 mg | Freq: Once | INTRAMUSCULAR | Status: AC
  Administered 2020-08-10: 40 mg via INTRAVENOUS
  Filled 2020-08-10: qty 4

## 2020-08-10 MED ORDER — FUROSEMIDE 40 MG PO TABS
40.0000 mg | ORAL_TABLET | Freq: Every day | ORAL | Status: DC
  Administered 2020-08-11 – 2020-08-14 (×3): 40 mg via ORAL
  Filled 2020-08-10 (×5): qty 1

## 2020-08-10 NOTE — Care Management Important Message (Signed)
Important Message  Patient Details  Name: Jackie Turner MRN: [id number] Date of Birth: December 01, 1936   Medicare Important Message Given:  Yes     Dannette Barbara 08/10/2020, 1:38 PM

## 2020-08-10 NOTE — Progress Notes (Signed)
Patient's Heart rate has remain above 100s.  MD is aware  Cardiology going to see patient today.     Cardiology added Cardizem to patient medications.

## 2020-08-10 NOTE — Evaluation (Signed)
Occupational Therapy Evaluation Patient Details Name: Jackie Turner MRN: [id number] DOB: [birth year] Today's Date: 08/10/2020    History of Present Illness Jackie Turner is a 84 y.o. female with past medical history of asthma, bronchiectasis, spinal stenosis, osteoporosis s/p compression fracture x2, and HLD who presents to the ED complaining of hand pain and presenting with insidious hypoxia without SOB.   Clinical Impression   Jackie Turner was seen for OT evaluation this date. Prior to hospital admission, pt was generally independent in ADL/IADL management, however, she has been more limited in stamina since her recent admission ~1 month prior. Pt lives alone in a 1 level home, but voices plans to live with her granddaughter in her apartment home with a full flight of steps to enter immediately upon DC. Currently pt demonstrates impairments in strength, cardiopulmonary status, and activity tolerance which functionally her ability to perform ADL/self-care tasks. Pt currently requires MIN A for exertional ADL management including LB dressing and bathing tasks. Functional mobility assessment limited for pt safety this date. She is noted to have a resting heart rate ranging from 130's up to 150's intermittently with her room monitor indicating AFIB t/o session. Nsg notified/aware.  Pt would benefit from skilled OT services to address noted impairments and functional limitations (see below for any additional details) in order to maximize safety and independence while minimizing falls risk and caregiver burden. Upon hospital discharge, recommend HHOT to maximize pt safety and return to functional independence during meaningful occupations of daily life.       Follow Up Recommendations  Home health OT;Supervision - Intermittent    Equipment Recommendations  3 in 1 bedside commode    Recommendations for Other Services       Precautions / Restrictions Precautions Precautions:  Fall Precaution Comments: High Fall Restrictions Weight Bearing Restrictions: No      Mobility Bed Mobility               General bed mobility comments: Deferred. Pt up in recliner at start/end of session  Transfers                 General transfer comment: Deferred for pt safety. HR noted to be in 140's at rest with intermittent AFIB noted on room monitor. RN/NSG aware.    Balance Overall balance assessment: Mild deficits observed, not formally tested                                         ADL either performed or assessed with clinical judgement   ADL Overall ADL's : Needs assistance/impaired                                       General ADL Comments: Pt functionally limited by cardiopulmonary status and generalized weakness. She requires set-up assist for UB ADL mgt including grooming and UB dressing, and MIN-MOD A for more exertional ADL taks including LBD and bathing.     Vision Baseline Vision/History: Wears glasses Wears Glasses: At all times Patient Visual Report: No change from baseline       Perception     Praxis      Pertinent Vitals/Pain Pain Assessment: No/denies pain     Hand Dominance Right   Extremity/Trunk Assessment Upper Extremity Assessment Upper Extremity Assessment: Generalized weakness  Lower Extremity Assessment Lower Extremity Assessment: Generalized weakness;Defer to PT evaluation       Communication Communication Communication: No difficulties   Cognition Arousal/Alertness: Awake/alert Behavior During Therapy: WFL for tasks assessed/performed Overall Cognitive Status: Within Functional Limits for tasks assessed                                     General Comments       Exercises Other Exercises Other Exercises: Pt educated on falls prevention strategies, safe use of AE/DME for ADL management, pursed lip breathing strategies and routines modifications to support  safety and functional independence upon hospital DC.   Shoulder Instructions      Home Living Family/patient expects to be discharged to:: Private residence Living Arrangements: Alone Available Help at Discharge: Family;Friend(s);Available PRN/intermittently Type of Home: House Home Access: Stairs to enter [employer] of Steps: 3 Entrance Stairs-Rails: Right Home Layout: One level     Bathroom Shower/Tub: Walk-in shower;Door   [employer] Toilet: Standard     Home Equipment: Kasandra Knudsen - single point;Walker - 4 wheels;Shower seat;Hand held shower head   Additional Comments: Pt endorses plan to stay a little while with her grandaughter in a 1 level apartment home with at least a full flight of steps to enter upon DC.      Prior Functioning/Environment Level of Independence: Independent        Comments: Pt generally independent. She endorses being independent for bathing, dressing, and most IADL management however lately has been having a friend drive for her.        OT Problem List: Decreased strength;Cardiopulmonary status limiting activity;Decreased activity tolerance;Decreased safety awareness;Decreased knowledge of use of DME or AE;Impaired balance (sitting and/or standing)      OT Treatment/Interventions: Self-care/ADL training;Therapeutic exercise;Therapeutic activities;Energy conservation;DME and/or AE instruction;Patient/family education    OT Goals(Current goals can be found in the care plan section) Acute Rehab OT Goals Patient Stated Goal: To go home and get my strength back OT Goal Formulation: With patient Time For Goal Achievement: 08/24/20 Potential to Achieve Goals: Good ADL Goals Pt Will Perform Grooming: sitting;with modified independence (c LRAD PRN for improved safety and functional indep.) Pt Will Perform Lower Body Dressing: with set-up;with supervision;with caregiver independent in assisting (c LRAD PRN for improved safety and functional  indep.) Pt Will Transfer to Toilet: with modified independence;ambulating;bedside commode (c LRAD PRN for improved safety and functional indep.) Additional ADL Goal #1: Pt will independently verbalize a plan to implement at least 3 learned energy conservation strategies for improved safety and functional independence upon hospital DC.  OT Frequency: Min 2X/week   Barriers to D/C:            Co-evaluation              AM-PAC OT "6 Clicks" Daily Activity     Outcome Measure Help from another person eating meals?: A Little Help from another person taking care of personal grooming?: A Little Help from another person toileting, which includes using toliet, bedpan, or urinal?: A Little Help from another person bathing (including washing, rinsing, drying)?: A Little Help from another person to put on and taking off regular upper body clothing?: A Little Help from another person to put on and taking off regular lower body clothing?: A Little 6 Click Score: 18   End of Session Nurse Communication: Other (comment) (Pt requesting peanut butter crackers. NSG student approves, and OT  provided at end of session.)  Activity Tolerance: Treatment limited secondary to medical complications (Comment) (HR/SOB) Patient left: in chair;with call bell/phone within reach;with chair alarm set  OT Visit Diagnosis: Other abnormalities of gait and mobility (R26.89);Muscle weakness (generalized) (M62.81)                Time: 1658-0063 OT Time Calculation (min): 22 min Charges:  OT General Charges $OT Visit: 1 Visit OT Evaluation $OT Eval Moderate Complexity: 1 Mod OT Treatments $Self Care/Home Management : 8-22 mins  Shara Blazing, M.S., OTR/L Ascom: [phone] 08/10/20, 4:04 PM

## 2020-08-10 NOTE — Progress Notes (Signed)
Patient declines bipap tonight, due to small area of skin irritation/breakdown on the bridge of her nose. Patient is currently on 2 lpm nasal cannula with an SpO2 of 98%. She is no apparent respiratory distress, and resting comfortably at this time. Informed patient and RN to call if she has any difficulty breathing. Will continue to monitor.

## 2020-08-10 NOTE — TOC Initial Note (Signed)
Transition of Care [facility]) - Initial/Assessment Note    Patient Details  Name: Jackie Turner MRN: [id number] Date of Birth: 02-28-1936  [facility] CM/SW Contact:    Eileen Stanford, LCSW Phone Number: 08/10/2020, 12:54 PM  Clinical Narrative:   CSW spoke with pt's Granddaughter and pt at bedside. Pt wants to d/c home with Granddaughter--Granddaughter agreeable. Pt has equipment she needs at home. Pt was arranged with [facility] prior to admission. Pt would prefer to continue with them. [facility] will service. [place] arranged.             Expected Discharge Plan: [place] Barriers to Discharge: Continued Medical Work up   Patient Goals and CMS Choice Patient states their goals for this hospitalization and ongoing recovery are:: to go home   Choice offered to / list presented to : Adult Children, Patient  Expected Discharge Plan and Services Expected Discharge Plan: [place] In-house Referral: Clinical Social Work   Post Acute Care Choice: [place] arrangements for the past 2 months: [place]: PT, OT, RN [place] Agency: [facility] [employer] [facility] [employer] Date [place]: 08/10/20 Time [place]: [place] Representative spoke with at [place]: teresa  Prior Living Arrangements/Services Living arrangements for the past 2 months: [place] Lives with:: Adult Children Patient language and need for interpreter reviewed:: Yes Do you feel safe going back to the place where you live?: Yes      Need for Family Participation in Patient Care: Yes (Comment) Care giver support system in place?: Yes (comment)   Criminal Activity/Legal Involvement Pertinent to Current Situation/Hospitalization: No - Comment as needed  Activities of Daily Living Home Assistive Devices/Equipment: Oxygen (guardian heart monitor) ADL Screening (condition at time of  admission) Patient's cognitive ability adequate to safely complete daily activities?: Yes Is the patient deaf or have difficulty hearing?: No Does the patient have difficulty seeing, even when wearing glasses/contacts?: No Does the patient have difficulty concentrating, remembering, or making decisions?: No Patient able to express need for assistance with ADLs?: Yes Does the patient have difficulty dressing or bathing?: No Independently performs ADLs?: Yes (appropriate for developmental age) Does the patient have difficulty walking or climbing stairs?: No Weakness of Legs: None Weakness of Arms/Hands: None  Permission Sought/Granted Permission sought to share information with : Family Supports Permission granted to share information with : Yes, Verbal Permission Granted  Share Information with NAME: Marquis Buggy  Permission granted to share info w AGENCY: [facility]  Permission granted to share info w Relationship: daughter     Emotional Assessment Appearance:: Appears stated age Attitude/Demeanor/Rapport: Engaged Affect (typically observed): Accepting, Appropriate Orientation: : Oriented to Self, Oriented to Place, Oriented to  Time, Oriented to Situation Alcohol / Substance Use: Not Applicable Psych Involvement: No (comment)  Admission diagnosis:  Blood loss anemia [D50.0] GI bleeding [K92.2] Patient Active Problem List   Diagnosis Date Noted  . Acute respiratory failure with hypoxia and hypercapnia ([place]) 08/09/2020  . Acute blood loss anemia 08/09/2020  . GI bleeding 08/04/2020  . Depression, major, single episode, in partial remission ([place]) 06/22/2020  . Atrial fibrillation with RVR ([place]) 06/13/2020  . Acute on suspect chronic respiratory failure with hypoxia ([place]) 06/13/2020  . Right hand pain 06/13/2020  . Fall at  home, initial encounter 06/13/2020  . COPD (chronic obstructive pulmonary disease) ([place]) 06/13/2020  . Bronchiectasis without complication ([place]) 20/81/3887  .  Lymphedema 05/17/2019  . Chronic venous insufficiency 05/17/2019  . Swelling of limb 02/10/2018  . Varicose veins of leg with swelling, bilateral 02/10/2018  . Squamous cell carcinoma 09/22/2017  . Hypoxia 09/27/2015  . Abnormal CXR 09/27/2015  . Asthma with allergic rhinitis 08/31/2015  . Back ache 08/31/2015  . Chronic interstitial cystitis 08/31/2015  . Grief reaction 08/31/2015  . Hyperlipidemia 06/26/2015  . Migraine 06/26/2015  . Asthma 06/26/2015  . IBS (irritable bowel syndrome) 06/26/2015  . Osteoporosis 06/26/2015  . Fatigue 06/26/2015  . Lumbar radiculopathy 04/27/2013  . Urinary urgency 01/08/2012  . Frequency of urination 01/08/2012   PCP:  Mar Daring, PA-C Pharmacy:   [place], [place] - [address] [place] [address] Phone: [phone] Fax: [phone]  [id number] - [place], [place] - [address] [place] [address] Phone: [phone] Fax: [phone]     Social Determinants of Health (SDOH) Interventions    Readmission Risk Interventions No flowsheet data found.

## 2020-08-10 NOTE — Progress Notes (Signed)
   08/10/20 0800  Assess: MEWS Score  BP 130/84  Pulse Rate (!) 103  ECG Heart Rate (!) 110  Resp (!) 23  SpO2 100 %  Assess: MEWS Score  MEWS Temp 0  MEWS Systolic 0  MEWS Pulse 1  MEWS RR 1  MEWS LOC 0  MEWS Score 2  MEWS Score Color Yellow  Assess: if the MEWS score is Yellow or Red  Were vital signs taken at a resting state? Yes  Focused Assessment No change from prior assessment  Early Detection of Sepsis Score *See Row Information* Low  MEWS guidelines implemented *See Row Information* No, previously yellow, continue vital signs every 4 hours   Patient is being monitor for elevated HR

## 2020-08-10 NOTE — Progress Notes (Signed)
Jackie Antigua, MD [address] [place], [place], [place], [address] [address], [place], [place], [place], [address] Phone: [phone]  Fax: [phone]   Subjective: Patient continues to have elevated heart rate.  Continues to require supplemental oxygen.   Objective: Exam: Vital signs in last 24 hours: Vitals:   08/10/20 1100 08/10/20 1126 08/10/20 1200 08/10/20 1300  BP: 133/73 128/82 110/69 (!) 120/93  Pulse: (!) 110 (!) 101 61   Resp: (!) 34 16 19 19   Temp:  98.1 F (36.7 C)    TempSrc:  Oral    SpO2: 99% 94% 97%   Weight:      Height:       Weight change:   Intake/Output Summary (Last 24 hours) at 08/10/2020 1453 Last data filed at 08/10/2020 1409 Gross per 24 hour  Intake 660 ml  Output 451 ml  Net 209 ml    General: No acute distress, AAO x3 Abd: Soft, NT/ND, No HSM Skin: Warm, no rashes Neck: Supple, Trachea midline   Lab Results: Lab Results  Component Value Date   WBC 7.4 08/10/2020   HGB 10.6 (L) 08/10/2020   HCT 34.9 (L) 08/10/2020   MCV 78.3 (L) 08/10/2020   PLT 331 08/10/2020   Micro Results: Recent Results (from the past 240 hour(s))  SARS Coronavirus 2 by RT PCR (hospital order, performed in [place] hospital lab) Nasopharyngeal Nasopharyngeal Swab     Status: None   Collection Time: 08/04/20 10:53 PM   Specimen: Nasopharyngeal Swab  Result Value Ref Range Status   SARS Coronavirus 2 NEGATIVE NEGATIVE Final    Comment: (NOTE) SARS-CoV-2 target nucleic acids are NOT DETECTED.  The SARS-CoV-2 RNA is generally detectable in upper and lower respiratory specimens during the acute phase of infection. The lowest concentration of SARS-CoV-2 viral copies this assay can detect is 250 copies / mL. A negative result does not preclude SARS-CoV-2 infection and should not be used as the sole basis for treatment or other patient management decisions.  A negative result may occur with improper specimen collection / handling,  submission of specimen other than nasopharyngeal swab, presence of viral mutation(s) within the areas targeted by this assay, and inadequate number of viral copies (<250 copies / mL). A negative result must be combined with clinical observations, patient history, and epidemiological information.  Fact Sheet for Patients:   StrictlyIdeas.no  Fact Sheet for Healthcare Providers: BankingDealers.co.za  This test is not yet approved or  cleared by the [place] FDA and has been authorized for detection and/or diagnosis of SARS-CoV-2 by FDA under an Emergency Use Authorization (EUA).  This EUA will remain in effect (meaning this test can be used) for the duration of the COVID-19 declaration under Section 564(b)(1) of the Act, 21 U.S.C. section 360bbb-3(b)(1), unless the authorization is terminated or revoked sooner.  Performed at [facility], [address] [place], [place] [address]    Studies/Results: [facility] Chest Port 1 View  Result Date: 08/10/2020 CLINICAL DATA:  Hypoxia EXAM: PORTABLE CHEST 1 VIEW COMPARISON:  Chest radiograph August 07, 2020 and chest CT August 08, 2020 FINDINGS: There are persistent pleural effusions bilaterally with atelectasis and ill-defined airspace opacity in the lung bases. There is also atelectatic change in the left mid lung region. There is mild interstitial edema. There is cardiomegaly with pulmonary venous hypertension. No adenopathy. There is aortic atherosclerosis. Monitor device over the left hemithorax anteriorly. T10 vertebral body fracture better seen on recent CT. IMPRESSION: Cardiomegaly with  a degree of pulmonary vascular congestion. Pleural effusions with a degree of interstitial edema. Suspect a degree of underlying congestive heart failure. There is atelectatic change with patchy airspace opacity in the lung bases. There may be a degree of alveolar edema or possible pneumonia in the lung  bases. Appearance similar to recent studies. Aortic Atherosclerosis (ICD10-I70.0). Electronically Signed   By: Lowella Grip III M.D.   On: 08/10/2020 08:13   Medications:  Scheduled Meds: . Chlorhexidine Gluconate Cloth  6 each Topical Daily  . diltiazem  240 mg Oral Daily  . doxepin  30 mg Oral QHS  . fluticasone furoate-vilanterol  1 puff Inhalation Daily   And  . umeclidinium bromide  1 puff Inhalation Daily  . furosemide  40 mg Oral Daily  . ipratropium-albuterol  3 mL Nebulization Once  . ipratropium-albuterol  3 mL Nebulization TID  . lidocaine  1 patch Transdermal Q24H  . mouth rinse  15 mL Mouth Rinse BID  . oxybutynin  15 mg Oral QHS  . pantoprazole (PROTONIX) IV  40 mg Intravenous Q12H  . pentosan polysulfate  100 mg Oral BID   Continuous Infusions: . sodium chloride 500 mL (08/10/20 1012)   PRN Meds:.sodium chloride, acetaminophen **OR** acetaminophen, albuterol, LORazepam, metoprolol tartrate, morphine injection, ondansetron **OR** ondansetron (ZOFRAN) IV, sodium chloride, traMADol, traZODone   Assessment: Principal Problem:   GI bleeding Active Problems:   IBS (irritable bowel syndrome)   Atrial fibrillation with RVR (HCC)   COPD (chronic obstructive pulmonary disease) (HCC)   Acute respiratory failure with hypoxia and hypercapnia (HCC)   Acute blood loss anemia    Plan: Chest x-ray today continues to show pleural effusions  With patient's A. fib with RVR, oxygen requirement, upper endoscopy remains high risks and benefits at this time, given patient's hemoglobin has been stable for period of days  Endoscopic procedures to be done once oxygen requirements improved further and elevated heart rate improves   LOS: 6 days   Jackie Antigua, MD 08/10/2020, 2:53 PM

## 2020-08-10 NOTE — Progress Notes (Signed)
   08/10/20 1454  Assess: MEWS Score  Temp 98 F (36.7 C)  BP 124/79  Pulse Rate (!) 129  ECG Heart Rate (!) 129  Resp (!) 21  SpO2 95 %  O2 Device Nasal Cannula  O2 Flow Rate (L/min) 2 L/min  Assess: MEWS Score  MEWS Temp 0  MEWS Systolic 0  MEWS Pulse 2  MEWS RR 1  MEWS LOC 0  MEWS Score 3  MEWS Score Color Yellow  Assess: if the MEWS score is Yellow or Red  Were vital signs taken at a resting state? Yes  Focused Assessment No change from prior assessment  Early Detection of Sepsis Score *See Row Information* Low  MEWS guidelines implemented *See Row Information* No, previously yellow, continue vital signs every 4 hours   Cardiology following-  Patient is asymptomatic at this time  Cardizem reordered

## 2020-08-10 NOTE — Consult Note (Signed)
[facility] Consultation Note  Patient ID: Jackie Turner, MRN: [id number], DOB/AGE: March 01, 1936 84 y.o. Admit date: 08/04/2020   Date of Consult: 08/10/2020 Primary Physician: Mar Daring, PA-C Primary Cardiologist: Paraschos  Chief Complaint:  Chief Complaint  Patient presents with  . Fatigue  . Abnormal Lab   Reason for Consult: Atrial fibrillation  HPI: 84 y.o. female with a hypertension hyperlipidemia and paroxysmal nonvalvular atrial fibrillation for which she was recently placed on anticoagulation for further risk reduction and stroke.  She has had reasonable heart rate control under those conditions but recently has had some black stools for which there is concerns of significant bleeding complications.  The Eliquis has been stopped and she has not had any primary evidence of bleeding at this time.  The patient does have some patchy infiltrate in her lungs with some hypoxia and concerns for pneumonia.  She has been placed on antibiotics with some improvements with Lasix as well.  BNP is minimally elevated with no evidence of significant heart failure.  The patient did have an echocardiogram 2 months prior showing normal LV systolic function and no evidence of significant valvular heart disease with ejection fraction of 65%.  Currently she appears to be hemodynamically stable with no symptoms of atrial fibrillation with rapid ventricular rate with no concerns of acute coronary syndrome angina and/or congestive heart failure at this time.  She will need further medication management and treatment options  Past Medical History:  Diagnosis Date  . Allergy   . Asthma   . Hyperlipidemia   . Osteoporosis       Surgical History:  Past Surgical History:  Procedure Laterality Date  . ABDOMINAL HYSTERECTOMY  2003  . BREAST BIOPSY  1976, 1978   benign  . carpal tunnel repair Bilateral    R 1990; L 1997  . EYE SURGERY Bilateral    cataract extraction  . HAND  TENDON SURGERY Right    Trigger finger  . IR RADIOLOGIST EVAL & MGMT  06/13/2017  . IR VERTEBROPLASTY CERV/THOR BX INC UNI/BIL INC/INJECT/IMAGING  06/17/2017  . TONSILLECTOMY  1961     Home Meds: Prior to Admission medications   Medication Sig Start Date End Date Taking? Authorizing Provider  apixaban (ELIQUIS) 2.5 MG TABS tablet Take 1 tablet (2.5 mg total) by mouth 2 (two) times daily. 06/22/20  Yes Mar Daring, PA-C  calcium carbonate (CALTRATE 600) 1500 (600 Ca) MG TABS tablet Take 1 tablet by mouth 2 (two) times daily with a meal.  06/22/20  Yes Burnette, Clearnce Sorrel, PA-C  CARDIZEM CD 120 MG 24 hr capsule Take 1 capsule (120 mg total) by mouth daily. Brand only 07/27/20  Yes Burnette, Clearnce Sorrel, PA-C  Clobetasol Propionate Emulsion 0.05 % topical foam Apply 1 application topically 2 (two) times daily.  06/22/20  Yes Mar Daring, PA-C  diclofenac Sodium (VOLTAREN) 1 % GEL Apply 4 g topically 4 (four) times daily. 08/03/20  Yes Mar Daring, PA-C  dicyclomine (BENTYL) 10 MG capsule Take 1 capsule (10 mg total) by mouth 3 (three) times daily as needed for spasms. 06/22/20  Yes Mar Daring, PA-C  doxepin (SINEQUAN) 10 MG capsule Take 3 capsules (30 mg total) by mouth at bedtime. 06/22/20  Yes Burnette, Clearnce Sorrel, PA-C  feeding supplement, ENSURE ENLIVE, (ENSURE ENLIVE) LIQD Take 237 mLs by mouth 2 (two) times daily between meals. 06/16/20  Yes Hongalgi, Lenis Dickinson, MD  fluticasone (FLONASE) 50 MCG/ACT nasal spray Place 1-2  sprays into both nostrils daily.  06/09/20  Yes [provider]  Fluticasone-Umeclidin-Vilant (TRELEGY ELLIPTA) 100-62.5-25 MCG/INH AEPB Inhale 1 puff into the lungs daily. 06/22/20  Yes Mar Daring, PA-C  nystatin (MYCOSTATIN) 100000 UNIT/ML suspension TAKE 5 MLS BY MOUTH 4 TIMES A DAY. Patient taking differently: Take 5 mLs by mouth 4 (four) times daily.  06/22/20  Yes Mar Daring, PA-C  oxybutynin (DITROPAN XL) 15 MG 24 hr tablet  Take 1 tablet (15 mg total) by mouth at bedtime. 06/22/20  Yes Mar Daring, PA-C  pentosan polysulfate (ELMIRON) 100 MG capsule Take 1 capsule (100 mg total) by mouth in the morning and at bedtime. Patient taking differently: Take 100 mg by mouth 2 (two) times daily as needed (urinary pain).  06/22/20  Yes Burnette, Jennifer M, PA-C  ULTRAM 50 MG tablet Take 1 tablet (50 mg total) by mouth every 6 (six) hours as needed. Patient taking differently: Take 50 mg by mouth every 6 (six) hours as needed for moderate pain.  06/02/20  Yes Mar Daring, PA-C  Urea 40 % LOTN Apply 1 application topically daily as needed (dry skin).  06/22/20  Yes Burnette, Jennifer M, PA-C  VENTOLIN HFA 108 (90 Base) MCG/ACT inhaler INHALE TWO PUFFS INTO THE LUNGS EVERY 4 HOURS AS NEEDED FOR WHEEZING OR SHORTNESS OF BREATH Patient taking differently: Inhale 2 puffs into the lungs every 4 (four) hours as needed for wheezing or shortness of breath.  06/22/20  Yes Mar Daring, PA-C    Inpatient Medications:  . Chlorhexidine Gluconate Cloth  6 each Topical Daily  . diltiazem  240 mg Oral Daily  . diltiazem  10 mg Intravenous Q6H  . doxepin  30 mg Oral QHS  . fluticasone furoate-vilanterol  1 puff Inhalation Daily   And  . umeclidinium bromide  1 puff Inhalation Daily  . furosemide  40 mg Intravenous Once  . ipratropium-albuterol  3 mL Nebulization Once  . ipratropium-albuterol  3 mL Nebulization Q6H WA  . lidocaine  1 patch Transdermal Q24H  . mouth rinse  15 mL Mouth Rinse BID  . oxybutynin  15 mg Oral QHS  . pantoprazole (PROTONIX) IV  40 mg Intravenous Q12H  . pentosan polysulfate  100 mg Oral BID   . sodium chloride 500 mL (08/10/20 1012)  . cefTRIAXone (ROCEPHIN)  IV 2 g (08/10/20 1013)    Allergies:  Allergies  Allergen Reactions  . Nsaids Other (See Comments)    History of gastric ulcer  . Amoxicillin Nausea Only and Rash  . Ibandronic Acid Other (See Comments)    Muscle weakness -  advised not to take  **BONIVA** - drug name  . Actonel  [Risedronate] Other (See Comments)    Gastric ulcers  . Antihistamines, Chlorpheniramine-Type   . Aspirin     Gastric ulcer  . Buspar [Buspirone] Other (See Comments)    Pt does not remember reaction   . Butalbital-Aspirin-Caffeine Other (See Comments)    Other reaction(s): Unknown  . Clarithromycin Other (See Comments)    Bloating  *BIAXIN*  . Codeine   . Gatifloxacin Other (See Comments)  . Hydrocodone-Guaifenesin Nausea Only    CODICLEAR DH STYRUP   . Moxifloxacin   . Oxycodone Other (See Comments)    Dizziness  . Penicillins Other (See Comments)  . Risedronate Sodium     Gastric ulcer  . Tylenol With Codeine #3  [Acetaminophen-Codeine]     Other reaction(s): Unknown  . Raloxifene Other (See  Comments)    Bloating Bloating  *EVISTA*    Social History   Socioeconomic History  . Marital status: Widowed    Spouse name: [name]  . Number of children: 0  . Years of education: H/S  . Highest education level: 12th grade  Occupational History  . Occupation: Retired  Tobacco Use  . Smoking status: Never Smoker  . Smokeless tobacco: Never Used  Vaping Use  . Vaping Use: Never used  Substance and Sexual Activity  . Alcohol use: No  . Drug use: No  . Sexual activity: Never  Other Topics Concern  . Not on file  Social History Narrative  . Not on file   Social Determinants of Health   Financial Resource Strain:   . Difficulty of Paying Living Expenses: Not on file  Food Insecurity:   . Worried About Charity fundraiser in the Last Year: Not on file  . Ran Out of Food in the Last Year: Not on file  Transportation Needs:   . Lack of Transportation (Medical): Not on file  . Lack of Transportation (Non-Medical): Not on file  Physical Activity: Inactive  . Days of Exercise per Week: 0 days  . Minutes of Exercise per Session: 0 min  Stress: No Stress Concern Present  . Feeling of Stress : Not at all  Social  Connections: Unknown  . Frequency of Communication with Friends and Family: Patient refused  . Frequency of Social Gatherings with Friends and Family: Patient refused  . Attends Religious Services: Patient refused  . Active Member of Clubs or Organizations: Patient refused  . Attends Archivist Meetings: Patient refused  . Marital Status: Patient refused  Intimate Partner Violence: Unknown  . Fear of Current or Ex-Partner: Patient refused  . Emotionally Abused: Patient refused  . Physically Abused: Patient refused  . Sexually Abused: Patient refused     Family History  Problem Relation Age of Onset  . Heart disease Mother   . CAD Mother   . Congestive Heart Failure Mother   . Osteoarthritis Mother   . Cancer Sister        breast  . Breast cancer Sister      Review of Systems Positive for shortness of breath Negative for: General:  chills, fever, night sweats or weight changes.  Cardiovascular: PND orthopnea syncope dizziness  Dermatological skin lesions rashes Respiratory: Cough congestion Urologic: Frequent urination urination at night and hematuria Abdominal: negative for nausea, vomiting, diarrhea, bright red blood per rectum, positive for melena, or hematemesis Neurologic: negative for visual changes, and/or hearing changes  All other systems reviewed and are otherwise negative except as noted above.  Labs: No results for input(s): CKTOTAL, CKMB, TROPONINI in the last 72 hours. Lab Results  Component Value Date   WBC 7.4 08/10/2020   HGB 10.6 (L) 08/10/2020   HCT 34.9 (L) 08/10/2020   MCV 78.3 (L) 08/10/2020   PLT 331 08/10/2020    Recent Labs  Lab 08/10/20 0522  NA 146*  K 4.1  CL 96*  CO2 33*  BUN 28*  CREATININE 0.79  CALCIUM 9.1  PROT 6.3*  BILITOT 1.5*  ALKPHOS 59  ALT 13  AST 18  GLUCOSE 108*   Lab Results  Component Value Date   CHOL 216 (H) 05/21/2018   HDL 78 05/21/2018   LDLCALC 124 (H) 05/21/2018   TRIG 70 05/21/2018   No  results found for: DDIMER  Radiology/Studies:  CT HEAD WO CONTRAST  Result Date:  08/08/2020 CLINICAL DATA:  Delirium with altered mental status EXAM: CT HEAD WITHOUT CONTRAST TECHNIQUE: Contiguous axial images were obtained from the base of the skull through the vertex without intravenous contrast. COMPARISON:  None. FINDINGS: Brain: There is age related volume loss. There is no intracranial mass, hemorrhage, extra-axial fluid collection, or midline shift. There is slight small vessel disease in the centra semiovale bilaterally. No acute infarct is evident. Vascular: No hyperdense vessel. There is calcification in each carotid siphon region. Skull: The bony calvarium appears intact. Sinuses/Orbits: There is mucosal thickening in several ethmoid air cells. Other visualized paranasal sinuses are clear. Visualized orbits appear symmetric bilaterally. Other: Mastoid air cells are clear. IMPRESSION: Age related volume loss with mild periventricular small vessel disease. No acute infarct. No mass or hemorrhage. There are foci of arterial vascular calcification. There is mucosal thickening in several ethmoid air cells. Electronically Signed   By: Lowella Grip III M.D.   On: 08/08/2020 13:43   CT ANGIO CHEST PE W OR WO CONTRAST  Result Date: 08/08/2020 CLINICAL DATA:  Chest pain, shortness of breath, pleural effusion, rule out pulmonary embolism EXAM: CT ANGIOGRAPHY CHEST WITH CONTRAST TECHNIQUE: Multidetector CT imaging of the chest was performed using the standard protocol during bolus administration of intravenous contrast. Multiplanar CT image reconstructions and MIPs were obtained to evaluate the vascular anatomy. CONTRAST:  29mL OMNIPAQUE IOHEXOL 350 MG/ML SOLN COMPARISON:  06/12/2020 FINDINGS: Cardiovascular: Satisfactory opacification of the pulmonary arteries to the segmental level. No evidence of pulmonary embolism. Mild cardiomegaly. Trace pericardial effusion. Aortic atherosclerosis  Mediastinum/Nodes: No enlarged mediastinal, hilar, or axillary lymph nodes. Thyroid gland, trachea, and esophagus demonstrate no significant findings. Lungs/Pleura: Moderate bilateral pleural effusions and associated atelectasis or consolidation. Redemonstrated dense scarring of the left lung base. Upper Abdomen: No acute abnormality. Musculoskeletal: No chest wall abnormality. Redemonstrated high-grade wedge deformity of the T10 vertebral body. Review of the MIP images confirms the above findings. IMPRESSION: 1. Negative examination for pulmonary embolism. 2. Moderate bilateral pleural effusions and associated atelectasis or consolidation. 3. Aortic Atherosclerosis (ICD10-I70.0). Electronically Signed   By: Eddie Candle M.D.   On: 08/08/2020 14:13   DG Chest Port 1 View  Result Date: 08/10/2020 CLINICAL DATA:  Hypoxia EXAM: PORTABLE CHEST 1 VIEW COMPARISON:  Chest radiograph August 07, 2020 and chest CT August 08, 2020 FINDINGS: There are persistent pleural effusions bilaterally with atelectasis and ill-defined airspace opacity in the lung bases. There is also atelectatic change in the left mid lung region. There is mild interstitial edema. There is cardiomegaly with pulmonary venous hypertension. No adenopathy. There is aortic atherosclerosis. Monitor device over the left hemithorax anteriorly. T10 vertebral body fracture better seen on recent CT. IMPRESSION: Cardiomegaly with a degree of pulmonary vascular congestion. Pleural effusions with a degree of interstitial edema. Suspect a degree of underlying congestive heart failure. There is atelectatic change with patchy airspace opacity in the lung bases. There may be a degree of alveolar edema or possible pneumonia in the lung bases. Appearance similar to recent studies. Aortic Atherosclerosis (ICD10-I70.0). Electronically Signed   By: Lowella Grip III M.D.   On: 08/10/2020 08:13   DG Chest Port 1 View  Result Date: 08/07/2020 CLINICAL DATA:  Increased  shortness of breath EXAM: PORTABLE CHEST 1 VIEW COMPARISON:  08/06/2020 FINDINGS: Interval increase in diffuse bilateral interstitial pulmonary opacity. Unchanged elevation of the left hemidiaphragm. Probable small layering bilateral pleural effusions. Mild cardiomegaly. IMPRESSION: Interval increase in diffuse bilateral interstitial pulmonary opacity, which may reflect edema and/or  infection. Probable small layering bilateral pleural effusions. Electronically Signed   By: Eddie Candle M.D.   On: 08/07/2020 10:15   DG Chest Port 1 View  Result Date: 08/06/2020 CLINICAL DATA:  Wheezing, anemia EXAM: PORTABLE CHEST 1 VIEW COMPARISON:  06/12/2020 chest radiograph. FINDINGS: Chronic prominent elevation of the left hemidiaphragm. Stable cardiomediastinal silhouette with mild cardiomegaly. No pneumothorax. Small bilateral pleural effusions are increased bilaterally. Patchy consolidation at both lung bases, worsened. IMPRESSION: 1. Chronic prominent left hemidiaphragm elevation. Worsened patchy consolidation at both lung bases, which could represent any combination of atelectasis, aspiration or pneumonia. 2. Mild cardiomegaly. Small bilateral pleural effusions are increased bilaterally. Electronically Signed   By: Ilona Sorrel M.D.   On: 08/06/2020 11:55    EKG: Atrial fibrillation rapid ventricular rate nonspecific ST changes  Weights: Filed Weights   08/07/20 0306 08/08/20 0300 08/10/20 0507  Weight: 56.9 kg 55.1 kg 50 kg     Physical Exam: Blood pressure 130/84, pulse (!) 103, temperature 97.8 F (36.6 C), temperature source Oral, resp. rate (!) 23, height 4\' 11"  (1.499 m), weight 50 kg, SpO2 100 %. Body mass index is 22.28 kg/m. General: Well developed, well nourished, in no acute distress. Head eyes ears nose throat: Normocephalic, atraumatic, sclera non-icteric, no xanthomas, nares are without discharge. No apparent thyromegaly and/or mass  Lungs: Normal respiratory effort.  Few wheezes, no  rales, no rhonchi.  Slight decreased basilar breath sounds Heart: Irregular with normal S1 S2. no murmur gallop, no rub, PMI is normal size and placement, carotid upstroke normal without bruit, jugular venous pressure is normal Abdomen: Soft, non-tender, non-distended with normoactive bowel sounds. No hepatomegaly. No rebound/guarding. No obvious abdominal masses. Abdominal aorta is normal size without bruit Extremities: Trace edema. no cyanosis, no clubbing, no ulcers  Peripheral : 2+ bilateral upper extremity pulses, 2+ bilateral femoral pulses, 2+ bilateral dorsal pedal pulse Neuro: Alert and oriented. No facial asymmetry. No focal deficit. Moves all extremities spontaneously. Musculoskeletal: Normal muscle tone without kyphosis Psych:  Responds to questions appropriately with a normal affect.    Assessment: 84 year old female with hypertension hyperlipidemia and atrial fibrillation now having rapid ventricular rate due to patchy infiltrate possible diastolic dysfunction heart failure and some melena without evidence of acute coronary syndrome or myocardial infarction  Plan: 1.  Better heart rate control with diltiazem orally for goal heart rate between 60 and 108 bpm administered today 2.  Abstain from anticoagulation at this time due to concerns of bleeding complications until further evaluation 3.  Okay to proceed to endoscopy colonoscopy from the cardiac standpoint although may wait till pulmonary concerns improve 4.  No further cardiac diagnostics necessary at this time due to no evidence of acute coronary syndrome and or anginal symptoms 5.  Supportive care with antibiotics and oxygenation and pulmonary toilet for respiratory abnormality 6.  Low-dose oral Lasix for treatment of mild pleural effusions  Signed, Corey Skains M.D. [facility] 08/10/2020, 10:39 AM

## 2020-08-10 NOTE — Progress Notes (Addendum)
Noted that patient's HR was jumping up into the 150s.   Paged MD (cardiology) Per MD-  As long as patient's  HR does not sustain in the 150s, we will continue to monitor for now.

## 2020-08-10 NOTE — Progress Notes (Signed)
PROGRESS NOTE    Jackie Turner   KXF:[id number]  DOB: 17-Sep-1936  PCP: Mar Daring, PA-C    DOA: 08/04/2020 LOS: 6   Brief Narrative   Admission records reviewed and summarized:  Jackie Turner  is a 84 y.o. female with a known history of asthma, dyslipidemia, atrial fibrillation on Eliquis and osteoporosis, who presented to the emergency room with recurrent dark stools, having recently been started on Eliquis for A-fib in early July.    In the ED, temperature was 100.2, RR 25, BP 145/87 with HR 79, and O2 sat  90's% on 4 L nasal cannula oxygen.  CMP was unremarkable aside from glucose of 172.  CBC showed Hbg 6.3, Hct 21.5 (down from 11.4 and 35.8 on 06/22/2020). EKG showed A-fib with RVR at 104 bpm.  Treated with IV Protonix in the ED and admitted to hospitalist service with GI consulted for evaluation of GI bleeding.  Per Dr. Esmeralda Links Patel's note on 08/08/20:  "From August 05, 2020 till August 08, 2020 patient was in my care. Initial first 3 days of hospitalization patient was stable day 3 of hospitalization patient was dyspneic, and was managed immediately with IV diuretic therapy with a stat chest x-ray for possible aspiration versus volume overload.  As we diuresed patient she had good output of 1 L with the initial dose of Lasix, chest x-ray showed pulmonary vascular congestion with questionable pneumonia.  She was continued on IV Lasix therapy, evaluation was deferred until patient's pulmonary status was stable and she was not in respiratory failure.  Stat ABG showed hypercapnia with PCO2 in the 70s, and patient was confused and agitated at that time requiring 24 hours of sedation patient had as needed Haldol and Ativan given to her.  Patient tolerated BiPAP therapy.  Lasix therapy was scheduled and an additional dose of 20 mg was given to her today at noon time.  Since diuretic therapies and IV antibiotic initiation patient has been stable she is currently alert awake oriented  patient's decline in acute respiratory failure has been discussed with granddaughter [name] who was here yesterday when patient presented with symptoms of shortness of breath.  Discussed with nurse plan of ongoing BiPAP therapy and repeat ABG.  We will also obtain head CT, CTA to rule out any VTE or new stroke.   CT of the head noncontrast is negative for any acute events.  CTA of the chest is negative for any pulmonary embolism and shows pleural effusion and possible consolidation.  Plan now is to continue IV antibiotic therapy, wean patient off of BiPAP therapy to nasal cannula.  Proceed with GI evaluation will defer to GI for inpatient or outpatient versus condition of the patient as I am going off service.  Overall patient has improved expect discharge in the next 72 hours.  Will obtain physical therapy consult and OT consult.  Granddaughter was interested in case management for options of possible placement."       Assessment & Plan   Principal Problem:   GI bleeding Active Problems:   Atrial fibrillation with RVR (HCC)   Acute respiratory failure with hypoxia and hypercapnia (HCC)   Acute blood loss anemia   COPD (chronic obstructive pulmonary disease) (HCC)   IBS (irritable bowel syndrome)   Acute respiratory failure with hypoxia and hypercapnia - secondary to COPD, CHF and possibly PNA given consolidation on imaging.  Continue Bipap nightly.  Supplemental O2 to keep O2 sat > 90%.  Further plans as below.  Acute on Chronic Diastolic CHF - last echo June 2021 showed EF 60-65% with grade 1 diastolic dysfunction.  Monitor I/O's and daily weights.  Gave IV Lasix 40 mg this AM, once.  Previously was getting 20 mg IV twice daily.  Cardiology recommends low dose PO Lasix daily to maintain euvolemia.   Community-Acquired Pneumonia (vs aspiration pneumonitis) - POA.  Had been started on Rocephin and Flagyl.  Stopped Flagyl as anaerobic coverage no longer recommended for aspiration.  Has received  4 days antibiotics, afebrile, no leukocytosis, and procal < 0.10.  Will stop Rocephin.  Monitor clinically.  GI bleeding with Acute Blood Loss Anemia - POA. Anemia improving and no further bleeding seen.  Has history of PUD with bleeding many years ago.  GI following and plans for EGD, however respiratory status needs improvement.  Hold Eliquis.  Serial H&H's.  Transfuse in Hbg < 7.0.  (Has received 2 units RBC's so far).  Continue IV Protonix BID for now.   Page GI if any signs of active bleeding.   Hypokalemia - resolved.  K 3.1 on 8/25, replaced.  Monitor and replace as needed.   Atrial fibrillation with RVR - most likely due to anemia.  Eliquis held as above.  Cardiology consulted.  Started on PO Cardizem.  IV Lopressor PRN for rate control.  Maintain K>4.0 and Mg >2.0  COPD - not acutely exacerbated.  Duonebs scheduled and PRN.  Bipap nightly.  IBS with constipation - chronic, patient says has not had any constipation since starting Eliquis in July.  Monitor.    DVT prophylaxis: SCDs Start: 08/04/20 2314   Diet:  Diet Orders (From admission, onward)    Start     Ordered   08/07/20 1426  Diet NPO time specified  Diet effective now        08/07/20 1432            Code Status: Full Code    Subjective 08/10/20    Patient seen up in chair this AM, no family present.  She says her back feels much better sitting up in chair, has a lot of back pain at baseline.  Denies difficulty breathing, chest pain, palpitations, fever or chills.    Disposition Plan & Communication   Status is: Inpatient  Remains inpatient appropriate because:Inpatient level of care appropriate due to severity of illness  Dispo: The patient is from: Home              Anticipated d/c is to: Home vs SNF pending PT evaluation when more stable              Anticipated d/c date is: 2 days              Patient currently is not medically stable to d/c.  Family Communication: none at bedside during encounter, will  attempt to call her granddaughter   Consults, Procedures, Significant Events   Consultants:   Gastroenterology  Cardiology  Procedures:   None  Antimicrobials:   Rocephin 8/23 >> 8/26   Objective   Vitals:   08/09/20 2025 08/09/20 2039 08/09/20 2049 08/10/20 0507  BP: (!) 142/90  117/76 (!) 142/87  Pulse: (!) 113  99 63  Resp: (!) 24  15 19   Temp: 98.3 F (36.8 C)   98 F (36.7 C)  TempSrc: Oral   Axillary  SpO2: 94% 99% 100% 94%  Weight:    50 kg  Height:        Intake/Output Summary (  Last 24 hours) at 08/10/2020 0728 Last data filed at 08/09/2020 2301 Gross per 24 hour  Intake 180 ml  Output 1751 ml  Net -1571 ml   Filed Weights   08/07/20 0306 08/08/20 0300 08/10/20 0507  Weight: 56.9 kg 55.1 kg 50 kg    Physical Exam:  General exam: awake, alert, no acute distress Respiratory system: CTAB but diminished bases, no wheezes or rhonchi, normal respiratory effort, on 2 L/min oxygen. Cardiovascular system: normal S1/S2, irregularly irregular, no pedal edema.   Central nervous system: A&O x3. no gross focal neurologic deficits, normal speech Extremities: moves all, no cyanosis, normal tone Skin: dry, intact, normal temperature Psychiatry: normal mood, congruent affect  Labs   Data Reviewed: I have personally reviewed following labs and imaging studies  CBC: Recent Labs  Lab 08/03/20 1404 08/04/20 1726 08/06/20 1019 08/07/20 0543 08/08/20 0449 08/09/20 0816 08/10/20 0522  WBC 7.2   < > 7.7 8.6 7.3 7.8 7.4  NEUTROABS 5.0  --  6.0  --  5.2  --   --   HGB 6.4*   < > 9.5* 9.1* 8.9* 10.3* 10.6*  HCT 22.1*   < > 32.5* 31.9* 30.4* 33.2* 34.9*  MCV 76*   < > 80.6 82.2 81.1 76.9* 78.3*  PLT 338   < > 293 318 265 307 331   < > = values in this interval not displayed.   Basic Metabolic Panel: Recent Labs  Lab 08/06/20 1019 08/07/20 0543 08/07/20 1522 08/08/20 0449 08/09/20 0816  NA 142 144 144 146* 144  K 3.8 4.0 3.7 3.3* 3.1*  CL 102 102 99 99  90*  CO2 30 31 33* 36* 38*  GLUCOSE 128* 124* 144* 99 102*  BUN 13 12 14 16 19   CREATININE 0.69 0.67 0.59 0.64 0.83  CALCIUM 8.6* 8.8* 8.2* 8.3* 8.8*  MG 1.9 2.0 1.7 2.2 1.8  PHOS 2.7 2.7  --  2.3*  --    GFR: Estimated Creatinine Clearance: 35.6 mL/min (by C-G formula based on SCr of 0.83 mg/dL). Liver Function Tests: Recent Labs  Lab 08/03/20 1404 08/04/20 1726 08/06/20 1019 08/08/20 0449 08/09/20 0816  AST 17 20 18 15 18   ALT 10 14 12 11 14   ALKPHOS 75 61 60 60 62  BILITOT 0.2 0.6 0.8 0.7 1.8*  PROT 6.0 6.5 6.1* 5.8* 5.8*  ALBUMIN 3.9 3.7 3.5 3.3* 3.4*   No results for input(s): LIPASE, AMYLASE in the last 168 hours. No results for input(s): AMMONIA in the last 168 hours. Coagulation Profile: Recent Labs  Lab 08/04/20 1726  INR 1.1   Cardiac Enzymes: No results for input(s): CKTOTAL, CKMB, CKMBINDEX, TROPONINI in the last 168 hours. BNP (last 3 results) No results for input(s): PROBNP in the last 8760 hours. HbA1C: No results for input(s): HGBA1C in the last 72 hours. CBG: Recent Labs  Lab 08/05/20 2204 08/06/20 0806 08/06/20 1203 08/06/20 1657 08/06/20 2153  GLUCAP 103* 102* 100* 110* 82   Lipid Profile: No results for input(s): CHOL, HDL, LDLCALC, TRIG, CHOLHDL, LDLDIRECT in the last 72 hours. Thyroid Function Tests: No results for input(s): TSH, T4TOTAL, FREET4, T3FREE, THYROIDAB in the last 72 hours. Anemia Panel: No results for input(s): VITAMINB12, FOLATE, FERRITIN, TIBC, IRON, RETICCTPCT in the last 72 hours. Sepsis Labs: No results for input(s): PROCALCITON, LATICACIDVEN in the last 168 hours.  Recent Results (from the past 240 hour(s))  SARS Coronavirus 2 by RT PCR (hospital order, performed in [facility] hospital lab) Nasopharyngeal Nasopharyngeal  Swab     Status: None   Collection Time: 08/04/20 10:53 PM   Specimen: Nasopharyngeal Swab  Result Value Ref Range Status   SARS Coronavirus 2 NEGATIVE NEGATIVE Final    Comment:  (NOTE) SARS-CoV-2 target nucleic acids are NOT DETECTED.  The SARS-CoV-2 RNA is generally detectable in upper and lower respiratory specimens during the acute phase of infection. The lowest concentration of SARS-CoV-2 viral copies this assay can detect is 250 copies / mL. A negative result does not preclude SARS-CoV-2 infection and should not be used as the sole basis for treatment or other patient management decisions.  A negative result may occur with improper specimen collection / handling, submission of specimen other than nasopharyngeal swab, presence of viral mutation(s) within the areas targeted by this assay, and inadequate number of viral copies (<250 copies / mL). A negative result must be combined with clinical observations, patient history, and epidemiological information.  Fact Sheet for Patients:   StrictlyIdeas.no  Fact Sheet for Healthcare Providers: BankingDealers.co.za  This test is not yet approved or  cleared by the [place] FDA and has been authorized for detection and/or diagnosis of SARS-CoV-2 by FDA under an Emergency Use Authorization (EUA).  This EUA will remain in effect (meaning this test can be used) for the duration of the COVID-19 declaration under Section 564(b)(1) of the Act, 21 U.S.C. section 360bbb-3(b)(1), unless the authorization is terminated or revoked sooner.  Performed at [facility], [address] [place], [place] [address]       Imaging Studies   CT HEAD WO CONTRAST  Result Date: 08/08/2020 CLINICAL DATA:  Delirium with altered mental status EXAM: CT HEAD WITHOUT CONTRAST TECHNIQUE: Contiguous axial images were obtained from the base of the skull through the vertex without intravenous contrast. COMPARISON:  None. FINDINGS: Brain: There is age related volume loss. There is no intracranial mass, hemorrhage, extra-axial fluid collection, or midline shift. There is slight  small vessel disease in the centra semiovale bilaterally. No acute infarct is evident. Vascular: No hyperdense vessel. There is calcification in each carotid siphon region. Skull: The bony calvarium appears intact. Sinuses/Orbits: There is mucosal thickening in several ethmoid air cells. Other visualized paranasal sinuses are clear. Visualized orbits appear symmetric bilaterally. Other: Mastoid air cells are clear. IMPRESSION: Age related volume loss with mild periventricular small vessel disease. No acute infarct. No mass or hemorrhage. There are foci of arterial vascular calcification. There is mucosal thickening in several ethmoid air cells. Electronically Signed   By: Lowella Grip III M.D.   On: 08/08/2020 13:43   CT ANGIO CHEST PE W OR WO CONTRAST  Result Date: 08/08/2020 CLINICAL DATA:  Chest pain, shortness of breath, pleural effusion, rule out pulmonary embolism EXAM: CT ANGIOGRAPHY CHEST WITH CONTRAST TECHNIQUE: Multidetector CT imaging of the chest was performed using the standard protocol during bolus administration of intravenous contrast. Multiplanar CT image reconstructions and MIPs were obtained to evaluate the vascular anatomy. CONTRAST:  42mL OMNIPAQUE IOHEXOL 350 MG/ML SOLN COMPARISON:  06/12/2020 FINDINGS: Cardiovascular: Satisfactory opacification of the pulmonary arteries to the segmental level. No evidence of pulmonary embolism. Mild cardiomegaly. Trace pericardial effusion. Aortic atherosclerosis Mediastinum/Nodes: No enlarged mediastinal, hilar, or axillary lymph nodes. Thyroid gland, trachea, and esophagus demonstrate no significant findings. Lungs/Pleura: Moderate bilateral pleural effusions and associated atelectasis or consolidation. Redemonstrated dense scarring of the left lung base. Upper Abdomen: No acute abnormality. Musculoskeletal: No chest wall abnormality. Redemonstrated high-grade wedge deformity of the T10 vertebral body. Review of the  MIP images confirms the above  findings. IMPRESSION: 1. Negative examination for pulmonary embolism. 2. Moderate bilateral pleural effusions and associated atelectasis or consolidation. 3. Aortic Atherosclerosis (ICD10-I70.0). Electronically Signed   By: Eddie Candle M.D.   On: 08/08/2020 14:13     Medications   Scheduled Meds: . Chlorhexidine Gluconate Cloth  6 each Topical Daily  . diltiazem  10 mg Intravenous Q6H  . doxepin  30 mg Oral QHS  . fluticasone furoate-vilanterol  1 puff Inhalation Daily   And  . umeclidinium bromide  1 puff Inhalation Daily  . ipratropium-albuterol  3 mL Nebulization Once  . ipratropium-albuterol  3 mL Nebulization Q6H WA  . lidocaine  1 patch Transdermal Q24H  . mouth rinse  15 mL Mouth Rinse BID  . oxybutynin  15 mg Oral QHS  . pantoprazole (PROTONIX) IV  40 mg Intravenous Q12H  . pentosan polysulfate  100 mg Oral BID   Continuous Infusions: . sodium chloride Stopped (08/09/20 0240)  . cefTRIAXone (ROCEPHIN)  IV 2 g (08/09/20 0841)       LOS: 6 days    Time spent: 28 minutes    Ezekiel Slocumb, DO [facility]  08/10/2020, 7:28 AM    If 7PM-7AM, please contact night-coverage. How to contact the [facility] Attending or Consulting provider [place] or covering provider during after hours [place], for this patient?    1. Check the care team in [facility] and look for a) attending/consulting [facility] provider listed and b) the [facility] team listed 2. Log into [email or web address] and use [place]'s universal password to access. If you do not have the password, please contact the hospital operator. 3. Locate the [facility] provider you are looking for under [facility] and page to a number that you can be directly reached. 4. If you still have difficulty reaching the provider, please page the [facility] (Director on Call) for the Hospitalists listed on amion for assistance.

## 2020-08-11 LAB — COMPREHENSIVE METABOLIC PANEL
ALT: 14 U/L (ref 0–44)
AST: 23 U/L (ref 15–41)
Albumin: 3.6 g/dL (ref 3.5–5.0)
Alkaline Phosphatase: 58 U/L (ref 38–126)
Anion gap: 17 — ABNORMAL HIGH (ref 5–15)
BUN: 33 mg/dL — ABNORMAL HIGH (ref 8–23)
CO2: 37 mmol/L — ABNORMAL HIGH (ref 22–32)
Calcium: 9.2 mg/dL (ref 8.9–10.3)
Chloride: 95 mmol/L — ABNORMAL LOW (ref 98–111)
Creatinine, Ser: 0.84 mg/dL (ref 0.44–1.00)
GFR calc Af Amer: >60 mL/min (ref >60)
GFR calc non Af Amer: >60 mL/min (ref >60)
Glucose, Bld: 121 mg/dL — ABNORMAL HIGH (ref 70–99)
Potassium: 3.5 mmol/L (ref 3.5–5.1)
Sodium: 149 mmol/L — ABNORMAL HIGH (ref 135–145)
Total Bilirubin: 0.9 mg/dL (ref 0.3–1.2)
Total Protein: 6.2 g/dL — ABNORMAL LOW (ref 6.5–8.1)

## 2020-08-11 LAB — CBC
HCT: 36.5 % (ref 36.0–46.0)
Hemoglobin: 11.1 g/dL — ABNORMAL LOW (ref 12.0–15.0)
MCH: 23.8 pg — ABNORMAL LOW (ref 26.0–34.0)
MCHC: 30.4 g/dL (ref 30.0–36.0)
MCV: 78.2 fL — ABNORMAL LOW (ref 80.0–100.0)
Platelets: 308 10*3/uL (ref 150–400)
RBC: 4.67 MIL/uL (ref 3.87–5.11)
RDW: 19.6 % — ABNORMAL HIGH (ref 11.5–15.5)
WBC: 6.9 10*3/uL (ref 4.0–10.5)
nRBC: 0 % (ref 0.0–0.2)

## 2020-08-11 LAB — PROCALCITONIN: Procalcitonin: <0.1 ng/mL

## 2020-08-11 LAB — MAGNESIUM: Magnesium: 2.2 mg/dL (ref 1.7–2.4)

## 2020-08-11 MED ORDER — POTASSIUM CHLORIDE CRYS ER 20 MEQ PO TBCR
40.0000 meq | EXTENDED_RELEASE_TABLET | Freq: Once | ORAL | Status: AC
  Administered 2020-08-11: 40 meq via ORAL
  Filled 2020-08-11: qty 2

## 2020-08-11 MED ORDER — METOPROLOL TARTRATE 25 MG PO TABS
25.0000 mg | ORAL_TABLET | Freq: Two times a day (BID) | ORAL | Status: DC
  Administered 2020-08-11 (×2): 25 mg via ORAL
  Filled 2020-08-11: qty 1

## 2020-08-11 NOTE — Progress Notes (Addendum)
PROGRESS NOTE    Jackie Turner   BZJ:[id number]  DOB: 02/21/1936  PCP: Mar Daring, PA-C    DOA: 08/04/2020 LOS: 7   Brief Narrative   Admission records reviewed and summarized:  Jackie Turner  is a 84 y.o. female with a known history of asthma, dyslipidemia, atrial fibrillation on Eliquis and osteoporosis, who presented to the emergency room with recurrent dark stools, having recently been started on Eliquis for A-fib in early July.    In the ED, temperature was 100.2, RR 25, BP 145/87 with HR 79, and O2 sat  90's% on 4 L nasal cannula oxygen.  CMP was unremarkable aside from glucose of 172.  CBC showed Hbg 6.3, Hct 21.5 (down from 11.4 and 35.8 on 06/22/2020). EKG showed A-fib with RVR at 104 bpm.  Treated with IV Protonix in the ED and admitted to hospitalist service with GI consulted for evaluation of GI bleeding.  Per Dr. Esmeralda Links Patel's note on 08/08/20:  "From August 05, 2020 till August 08, 2020 patient was in my care. Initial first 3 days of hospitalization patient was stable day 3 of hospitalization patient was dyspneic, and was managed immediately with IV diuretic therapy with a stat chest x-ray for possible aspiration versus volume overload.  As we diuresed patient she had good output of 1 L with the initial dose of Lasix, chest x-ray showed pulmonary vascular congestion with questionable pneumonia.  She was continued on IV Lasix therapy, evaluation was deferred until patient's pulmonary status was stable and she was not in respiratory failure.  Stat ABG showed hypercapnia with PCO2 in the 70s, and patient was confused and agitated at that time requiring 24 hours of sedation patient had as needed Haldol and Ativan given to her.  Patient tolerated BiPAP therapy.  Lasix therapy was scheduled and an additional dose of 20 mg was given to her today at noon time.  Since diuretic therapies and IV antibiotic initiation patient has been stable she is currently alert awake oriented  patient's decline in acute respiratory failure has been discussed with granddaughter [name] who was here yesterday when patient presented with symptoms of shortness of breath.  Discussed with nurse plan of ongoing BiPAP therapy and repeat ABG.  We will also obtain head CT, CTA to rule out any VTE or new stroke.   CT of the head noncontrast is negative for any acute events.  CTA of the chest is negative for any pulmonary embolism and shows pleural effusion and possible consolidation.  Plan now is to continue IV antibiotic therapy, wean patient off of BiPAP therapy to nasal cannula.  Proceed with GI evaluation will defer to GI for inpatient or outpatient versus condition of the patient as I am going off service.  Overall patient has improved expect discharge in the next 72 hours.  Will obtain physical therapy consult and OT consult.  Granddaughter was interested in case management for options of possible placement."       Assessment & Plan   Principal Problem:   GI bleeding Active Problems:   Atrial fibrillation with RVR (HCC)   Acute respiratory failure with hypoxia and hypercapnia (HCC)   Acute blood loss anemia   COPD (chronic obstructive pulmonary disease) (HCC)   IBS (irritable bowel syndrome)   Acute respiratory failure with hypoxia and hypercapnia - secondary to COPD, CHF and possibly PNA given consolidation on imaging.  Bipap nightly.  Supplemental O2 to keep O2 sat > 90%.  Further plans as below.  Acute on Chronic Diastolic CHF - last echo June 2021 showed EF 60-65% with grade 1 diastolic dysfunction.  Monitor I/O's and daily weights.  Cardiology following, recommends low dose PO Lasix daily.  Will check a repeat Echo to ensure to major changes from recent.  Atrial fibrillation with RVR - most likely due to anemia.  Eliquis held as above.  Cardiology consulted.  Started on PO Cardizem, low dose beta blocker added as well.  IV Lopressor PRN for rate control.  Maintain K>4.0 and Mg  >2.0.  Hypernatremia - new 8/26 Na 146 >> 149 today.  Encourage patient to drink water.  Will avoid D5W infusion given need for diuresis.  BMP in AM.  Anion gap metabolic acidosis -not present on admission.  Likely due to diuresis.  Monitor.  Community-Acquired Pneumonia (vs aspiration pneumonitis) - POA.  Had been started on Rocephin and Flagyl.  Stopped Flagyl as anaerobic coverage no longer recommended for aspiration.  Received 4 days antibiotics, afebrile, no leukocytosis, and procal < 0.10, so stopped Rocephin 8.26.  Monitor clinically.    GI bleeding with Acute Blood Loss Anemia - POA. Anemia improving and no further bleeding seen.  Has history of PUD with bleeding many years ago.  Hold Eliquis.  Serial H&H's.  Transfuse in Hbg < 7.0.  (Has received 2 units RBC's so far).  Continue IV Protonix BID for now.   Page GI if any signs of active bleeding.  --EGD planned for tomorrow AM, 8/28.  Hypokalemia - resolved.  K 3.1 on 8/25, replaced.  Monitor and replace as needed.   COPD - not acutely exacerbated.  Duonebs scheduled and PRN.  Bipap nightly.  IBS with constipation - chronic, patient says has not had any constipation since starting Eliquis in July.  Monitor.    DVT prophylaxis: SCDs Start: 08/04/20 2314   Diet:  Diet Orders (From admission, onward)    Start     Ordered   08/10/20 1011  Diet Heart Room service appropriate? Yes; Fluid consistency: Thin  Diet effective now       Question Answer Comment  Room service appropriate? Yes   Fluid consistency: Thin      08/10/20 1010            Code Status: Full Code    Subjective 08/11/20    Patient seen up in chair this AM, no family present.  Says she did not wear bipap last night due to skin breakdown on bridge of her nose.  Feels well this AM.  No SOB, CP, F/C, N/V or other complaints.  Disposition Plan & Communication   Status is: Inpatient  Remains inpatient appropriate because:Inpatient level of care appropriate due  to severity of illness  Dispo: The patient is from: Home              Anticipated d/c is to: Home vs SNF pending PT evaluation when more stable              Anticipated d/c date is: 2 days              Patient currently is not medically stable to d/c.  Family Communication: none at bedside during encounter, will attempt to call her granddaughter   Consults, Procedures, Significant Events   Consultants:   Gastroenterology  Cardiology  Procedures:   None  Antimicrobials:   Rocephin 8/23 >> 8/26   Objective   Vitals:   08/11/20 0200 08/11/20 0400 08/11/20 0436 08/11/20 0700  BP: 117/73 Marland Kitchen)  116/99    Pulse: (!) 103 (!) 106    Resp: (!) 27 (!) 25    Temp:  98.7 F (37.1 C) 98.7 F (37.1 C)   TempSrc:   Oral   SpO2:  97%    Weight:    49.8 kg  Height:        Intake/Output Summary (Last 24 hours) at 08/11/2020 0810 Last data filed at 08/11/2020 0500 Gross per 24 hour  Intake 720 ml  Output 200 ml  Net 520 ml   Filed Weights   08/08/20 0300 08/10/20 0507 08/11/20 0700  Weight: 55.1 kg 50 kg 49.8 kg    Physical Exam:  General exam: awake, alert, no acute distress Respiratory system: CTAB but diminished bases, normal respiratory effort, on 2 L/min oxygen. Cardiovascular system: normal S1/S2, irregularly irregular, no pedal edema.   Central nervous system: A&O x3. no gross focal neurologic deficits, normal speech Extremities: moves all, no cyanosis, normal tone Skin: dry, intact, normal temperature Psychiatry: normal mood, congruent affect  Labs   Data Reviewed: I have personally reviewed following labs and imaging studies  CBC: Recent Labs  Lab 08/06/20 1019 08/06/20 1019 08/07/20 0543 08/08/20 0449 08/09/20 0816 08/10/20 0522 08/11/20 0626  WBC 7.7   < > 8.6 7.3 7.8 7.4 6.9  NEUTROABS 6.0  --   --  5.2  --   --   --   HGB 9.5*   < > 9.1* 8.9* 10.3* 10.6* 11.1*  HCT 32.5*   < > 31.9* 30.4* 33.2* 34.9* 36.5  MCV 80.6   < > 82.2 81.1 76.9* 78.3*  78.2*  PLT 293   < > 318 265 307 331 308   < > = values in this interval not displayed.   Basic Metabolic Panel: Recent Labs  Lab 08/06/20 1019 08/06/20 1019 08/07/20 0543 08/07/20 0543 08/07/20 1522 08/08/20 0449 08/09/20 0816 08/10/20 0522 08/11/20 0626  NA 142   < > 144   < > 144 146* 144 146* 149*  K 3.8   < > 4.0   < > 3.7 3.3* 3.1* 4.1 3.5  CL 102   < > 102   < > 99 99 90* 96* 95*  CO2 30   < > 31   < > 33* 36* 38* 33* 37*  GLUCOSE 128*   < > 124*   < > 144* 99 102* 108* 121*  BUN 13   < > 12   < > 14 16 19  28* 33*  CREATININE 0.69   < > 0.67   < > 0.59 0.64 0.83 0.79 0.84  CALCIUM 8.6*   < > 8.8*   < > 8.2* 8.3* 8.8* 9.1 9.2  MG 1.9   < > 2.0  --  1.7 2.2 1.8  --  2.2  PHOS 2.7  --  2.7  --   --  2.3*  --   --   --    < > = values in this interval not displayed.   GFR: Estimated Creatinine Clearance: 35.2 mL/min (by C-G formula based on SCr of 0.84 mg/dL). Liver Function Tests: Recent Labs  Lab 08/06/20 1019 08/08/20 0449 08/09/20 0816 08/10/20 0522 08/11/20 0626  AST 18 15 18 18 23   ALT 12 11 14 13 14   ALKPHOS 60 60 62 59 58  BILITOT 0.8 0.7 1.8* 1.5* 0.9  PROT 6.1* 5.8* 5.8* 6.3* 6.2*  ALBUMIN 3.5 3.3* 3.4* 3.6 3.6   No results for input(s): LIPASE, AMYLASE  in the last 168 hours. No results for input(s): AMMONIA in the last 168 hours. Coagulation Profile: Recent Labs  Lab 08/04/20 1726  INR 1.1   Cardiac Enzymes: No results for input(s): CKTOTAL, CKMB, CKMBINDEX, TROPONINI in the last 168 hours. BNP (last 3 results) No results for input(s): PROBNP in the last 8760 hours. HbA1C: No results for input(s): HGBA1C in the last 72 hours. CBG: Recent Labs  Lab 08/05/20 2204 08/06/20 0806 08/06/20 1203 08/06/20 1657 08/06/20 2153  GLUCAP 103* 102* 100* 110* 82   Lipid Profile: No results for input(s): CHOL, HDL, LDLCALC, TRIG, CHOLHDL, LDLDIRECT in the last 72 hours. Thyroid Function Tests: No results for input(s): TSH, T4TOTAL, FREET4, T3FREE,  THYROIDAB in the last 72 hours. Anemia Panel: No results for input(s): VITAMINB12, FOLATE, FERRITIN, TIBC, IRON, RETICCTPCT in the last 72 hours. Sepsis Labs: Recent Labs  Lab 08/10/20 0522  PROCALCITON <0.10    Recent Results (from the past 240 hour(s))  SARS Coronavirus 2 by RT PCR (hospital order, performed in [facility] hospital lab) Nasopharyngeal Nasopharyngeal Swab     Status: None   Collection Time: 08/04/20 10:53 PM   Specimen: Nasopharyngeal Swab  Result Value Ref Range Status   SARS Coronavirus 2 NEGATIVE NEGATIVE Final    Comment: (NOTE) SARS-CoV-2 target nucleic acids are NOT DETECTED.  The SARS-CoV-2 RNA is generally detectable in upper and lower respiratory specimens during the acute phase of infection. The lowest concentration of SARS-CoV-2 viral copies this assay can detect is 250 copies / mL. A negative result does not preclude SARS-CoV-2 infection and should not be used as the sole basis for treatment or other patient management decisions.  A negative result may occur with improper specimen collection / handling, submission of specimen other than nasopharyngeal swab, presence of viral mutation(s) within the areas targeted by this assay, and inadequate number of viral copies (<250 copies / mL). A negative result must be combined with clinical observations, patient history, and epidemiological information.  Fact Sheet for Patients:   StrictlyIdeas.no  Fact Sheet for Healthcare Providers: BankingDealers.co.za  This test is not yet approved or  cleared by the [place] FDA and has been authorized for detection and/or diagnosis of SARS-CoV-2 by FDA under an Emergency Use Authorization (EUA).  This EUA will remain in effect (meaning this test can be used) for the duration of the COVID-19 declaration under Section 564(b)(1) of the Act, 21 U.S.C. section 360bbb-3(b)(1), unless the authorization is terminated  or revoked sooner.  Performed at [facility], [address] [place], [place] [address]       Imaging Studies   DG Chest [place] 1 View  Result Date: 08/10/2020 CLINICAL DATA:  Hypoxia EXAM: PORTABLE CHEST 1 VIEW COMPARISON:  Chest radiograph August 07, 2020 and chest CT August 08, 2020 FINDINGS: There are persistent pleural effusions bilaterally with atelectasis and ill-defined airspace opacity in the lung bases. There is also atelectatic change in the left mid lung region. There is mild interstitial edema. There is cardiomegaly with pulmonary venous hypertension. No adenopathy. There is aortic atherosclerosis. Monitor device over the left hemithorax anteriorly. T10 vertebral body fracture better seen on recent CT. IMPRESSION: Cardiomegaly with a degree of pulmonary vascular congestion. Pleural effusions with a degree of interstitial edema. Suspect a degree of underlying congestive heart failure. There is atelectatic change with patchy airspace opacity in the lung bases. There may be a degree of alveolar edema or possible pneumonia in the lung bases. Appearance similar to recent studies.  Aortic Atherosclerosis (ICD10-I70.0). Electronically Signed   By: Lowella Grip III M.D.   On: 08/10/2020 08:13     Medications   Scheduled Meds: . Chlorhexidine Gluconate Cloth  6 each Topical Daily  . diltiazem  240 mg Oral Daily  . doxepin  30 mg Oral QHS  . fluticasone furoate-vilanterol  1 puff Inhalation Daily   And  . umeclidinium bromide  1 puff Inhalation Daily  . furosemide  40 mg Oral Daily  . ipratropium-albuterol  3 mL Nebulization Once  . ipratropium-albuterol  3 mL Nebulization TID  . lidocaine  1 patch Transdermal Q24H  . mouth rinse  15 mL Mouth Rinse BID  . oxybutynin  15 mg Oral QHS  . pantoprazole (PROTONIX) IV  40 mg Intravenous Q12H  . pentosan polysulfate  100 mg Oral BID   Continuous Infusions: . sodium chloride 500 mL (08/10/20 1012)       LOS: 7 days     Time spent: 25 minutes    Ezekiel Slocumb, DO [facility]  08/11/2020, 8:10 AM    If 7PM-7AM, please contact night-coverage. How to contact the [facility] Attending or Consulting provider [place] or covering provider during after hours [place], for this patient?    1. Check the care team in [facility] and look for a) attending/consulting [facility] provider listed and b) the [facility] team listed 2. Log into [email or web address] and use [place]'s universal password to access. If you do not have the password, please contact the hospital operator. 3. Locate the [facility] provider you are looking for under [facility] and page to a number that you can be directly reached. 4. If you still have difficulty reaching the provider, please page the [facility] (Director on Call) for the Hospitalists listed on amion for assistance.

## 2020-08-11 NOTE — Evaluation (Signed)
Physical Therapy Evaluation Patient Details Name: Jackie Turner MRN: [id number] DOB: [birth year] Today's Date: 08/11/2020   History of Present Illness  Jackie Turner is a 84 y.o. female with past medical history of asthma, bronchiectasis, spinal stenosis, osteoporosis s/p compression fracture x2, and HLD who presents to the ED complaining of hand pain and presenting with insidious hypoxia without SOB.    Clinical Impression  Pt up in chair, no complaints of pain on 2L via [place]. HR ranged from 80s-high 90s throughout session including mobility, spO2 >90% throughout. She reported at baseline she is independent for ambulation/ADLs, has family/neighbors to assist as needed.  She was able to sit and perform ADLs (teeth brushing, cleaning face/neck) seated in chair without support, independently. Performed transfers with supervision, and ambulated ~164ft with handheld assist for pt comfort. She did exhibit occasional unsteadiness (wanted to wear her flipflops) but did endorse she felt they made her more unsteady. No physical assist needed to correct small LOBs.  Overall the patient demonstrated deficits (see "PT Problem List") that impede the patient's functional abilities, safety, and mobility and would benefit from skilled PT intervention. Recommendation is HHPT and intermittent supervision to maximize safety and mobility.     Follow Up Recommendations Home health PT;Supervision - Intermittent    Equipment Recommendations  None recommended by PT (pt has SPC and walkers at home)    Recommendations for Other Services       Precautions / Restrictions Precautions Precautions: Fall Precaution Comments: High Fall Restrictions Weight Bearing Restrictions: No      Mobility  Bed Mobility               General bed mobility comments: Deferred. Pt up in recliner at start/end of session  Transfers Overall transfer level: Needs assistance   Transfers: Sit to/from Stand Sit to  Stand: Supervision         General transfer comment: HR in 80s-high 90s for mobility, readings in afib  Ambulation/Gait   Gait Distance (Feet): 120 Feet Assistive device: 1 person hand held assist   Gait velocity: decreased   General Gait Details: Pt with occasional unsteadiness noted, handheld assist provided. Pt able to self correct instability,  but may need at least unilateral support moving forward, pt verbalized understanding  Stairs            Wheelchair Mobility    Modified Rankin (Stroke Patients Only)       Balance Overall balance assessment: Mild deficits observed, not formally tested                                           Pertinent Vitals/Pain Pain Assessment: No/denies pain    Home Living Family/patient expects to be discharged to:: Private residence Living Arrangements: Alone Available Help at Discharge: Family;Friend(s);Available PRN/intermittently Type of Home: House Home Access: Stairs to enter Entrance Stairs-Rails: Right Entrance Stairs-Number of Steps: 3 Home Layout: One level Home Equipment: Cane - single point;Walker - 4 wheels;Shower seat;Hand held shower head Additional Comments: Pt endorses plan to stay a little while with her grandaughter in a 1 level apartment home with at least a full flight of steps to enter upon DC. PLOF gathered from pt and OT evaluation    Prior Function Level of Independence: Independent         Comments: Pt generally independent. She endorses being independent for bathing, dressing, and most  IADL management however lately has been having a friend drive for her.     Hand Dominance   Dominant Hand: Right    Extremity/Trunk Assessment   Upper Extremity Assessment Upper Extremity Assessment: Generalized weakness    Lower Extremity Assessment Lower Extremity Assessment: Generalized weakness    Cervical / Trunk Assessment Cervical / Trunk Assessment: Normal  Communication    Communication: No difficulties  Cognition Arousal/Alertness: Awake/alert Behavior During Therapy: WFL for tasks assessed/performed Overall Cognitive Status: Within Functional Limits for tasks assessed                                        General Comments      Exercises Other Exercises Other Exercises: Pt assisted to brush teeth (provided equipment) able to perform task independently, as well as washcloth to allow pt to clean her face   Assessment/Plan    PT Assessment Patient needs continued PT services  PT Problem List Decreased strength;Decreased mobility;Decreased activity tolerance       PT Treatment Interventions DME instruction;Balance training;Neuromuscular re-education;Gait training;Stair training;Functional mobility training;Patient/family education;Therapeutic activities;Therapeutic exercise    PT Goals (Current goals can be found in the Care Plan section)  Acute Rehab PT Goals Patient Stated Goal: to go home PT Goal Formulation: With patient Time For Goal Achievement: 08/25/20 Potential to Achieve Goals: Good    Frequency Min 2X/week   Barriers to discharge        Co-evaluation               AM-PAC PT "6 Clicks" Mobility  Outcome Measure Help needed turning from your back to your side while in a flat bed without using bedrails?: A Little Help needed moving from lying on your back to sitting on the side of a flat bed without using bedrails?: A Little Help needed moving to and from a bed to a chair (including a wheelchair)?: A Little Help needed standing up from a chair using your arms (e.g., wheelchair or bedside chair)?: A Little Help needed to walk in hospital room?: A Little Help needed climbing 3-5 steps with a railing? : A Little 6 Click Score: 18    End of Session Equipment Utilized During Treatment: Gait belt Activity Tolerance: Patient tolerated treatment well Patient left: in chair;with call bell/phone within reach Nurse  Communication: Mobility status PT Visit Diagnosis: Other abnormalities of gait and mobility (R26.89);Difficulty in walking, not elsewhere classified (R26.2);Muscle weakness (generalized) (M62.81)    Time: 2706-2376 PT Time Calculation (min) (ACUTE ONLY): 28 min   Charges:   PT Evaluation $PT Eval Moderate Complexity: 1 Mod PT Treatments $Therapeutic Exercise: 8-22 mins       Lieutenant Diego PT, DPT 12:51 PM,08/11/20

## 2020-08-11 NOTE — Progress Notes (Signed)
Jackie Antigua, MD [address] [place], [place], [place], [address] [address], [place], [place], [place], [address] Phone: [phone]  Fax: [phone]   Subjective: Patient is now improved from heart rate and dyspnea perspective.  Is now on oxygen via nasal cannula and heart rate seems better controlled.  No abdominal pain.  Tolerating oral diet.   Objective: Exam: Vital signs in last 24 hours: Vitals:   08/11/20 0830 08/11/20 1000 08/11/20 1039 08/11/20 1040  BP:  112/76    Pulse:  (!) 120 95   Resp:      Temp: 97.6 F (36.4 C)   97.8 F (36.6 C)  TempSrc: Oral   Oral  SpO2:  95% 97%   Weight:      Height:       Weight change: -0.272 kg  Intake/Output Summary (Last 24 hours) at 08/11/2020 1231 Last data filed at 08/11/2020 1029 Gross per 24 hour  Intake 720 ml  Output 300 ml  Net 420 ml    General: No acute distress, AAO x3 Abd: Soft, NT/ND, No HSM Skin: Warm, no rashes Neck: Supple, Trachea midline   Lab Results: Lab Results  Component Value Date   WBC 6.9 08/11/2020   HGB 11.1 (L) 08/11/2020   HCT 36.5 08/11/2020   MCV 78.2 (L) 08/11/2020   PLT 308 08/11/2020   Micro Results: Recent Results (from the past 240 hour(s))  SARS Coronavirus 2 by RT PCR (hospital order, performed in [place] hospital lab) Nasopharyngeal Nasopharyngeal Swab     Status: None   Collection Time: 08/04/20 10:53 PM   Specimen: Nasopharyngeal Swab  Result Value Ref Range Status   SARS Coronavirus 2 NEGATIVE NEGATIVE Final    Comment: (NOTE) SARS-CoV-2 target nucleic acids are NOT DETECTED.  The SARS-CoV-2 RNA is generally detectable in upper and lower respiratory specimens during the acute phase of infection. The lowest concentration of SARS-CoV-2 viral copies this assay can detect is 250 copies / mL. A negative result does not preclude SARS-CoV-2 infection and should not be used as the sole basis for treatment or other patient management decisions.  A  negative result may occur with improper specimen collection / handling, submission of specimen other than nasopharyngeal swab, presence of viral mutation(s) within the areas targeted by this assay, and inadequate number of viral copies (<250 copies / mL). A negative result must be combined with clinical observations, patient history, and epidemiological information.  Fact Sheet for Patients:   StrictlyIdeas.no  Fact Sheet for Healthcare Providers: BankingDealers.co.za  This test is not yet approved or  cleared by the [place] FDA and has been authorized for detection and/or diagnosis of SARS-CoV-2 by FDA under an Emergency Use Authorization (EUA).  This EUA will remain in effect (meaning this test can be used) for the duration of the COVID-19 declaration under Section 564(b)(1) of the Act, 21 U.S.C. section 360bbb-3(b)(1), unless the authorization is terminated or revoked sooner.  Performed at [facility], [address] [place], [place] [address]    Studies/Results: [facility] Chest Port 1 View  Result Date: 08/10/2020 CLINICAL DATA:  Hypoxia EXAM: PORTABLE CHEST 1 VIEW COMPARISON:  Chest radiograph August 07, 2020 and chest CT August 08, 2020 FINDINGS: There are persistent pleural effusions bilaterally with atelectasis and ill-defined airspace opacity in the lung bases. There is also atelectatic change in the left mid lung region. There is mild interstitial edema. There is cardiomegaly with pulmonary venous hypertension. No adenopathy. There is aortic atherosclerosis.  Monitor device over the left hemithorax anteriorly. T10 vertebral body fracture better seen on recent CT. IMPRESSION: Cardiomegaly with a degree of pulmonary vascular congestion. Pleural effusions with a degree of interstitial edema. Suspect a degree of underlying congestive heart failure. There is atelectatic change with patchy airspace opacity in the lung bases.  There may be a degree of alveolar edema or possible pneumonia in the lung bases. Appearance similar to recent studies. Aortic Atherosclerosis (ICD10-I70.0). Electronically Signed   By: Lowella Grip III M.D.   On: 08/10/2020 08:13   Medications:  Scheduled Meds: . Chlorhexidine Gluconate Cloth  6 each Topical Daily  . diltiazem  240 mg Oral Daily  . doxepin  30 mg Oral QHS  . fluticasone furoate-vilanterol  1 puff Inhalation Daily   And  . umeclidinium bromide  1 puff Inhalation Daily  . furosemide  40 mg Oral Daily  . ipratropium-albuterol  3 mL Nebulization Once  . ipratropium-albuterol  3 mL Nebulization TID  . lidocaine  1 patch Transdermal Q24H  . mouth rinse  15 mL Mouth Rinse BID  . metoprolol tartrate  25 mg Oral BID  . oxybutynin  15 mg Oral QHS  . pantoprazole (PROTONIX) IV  40 mg Intravenous Q12H  . pentosan polysulfate  100 mg Oral BID   Continuous Infusions: . sodium chloride 500 mL (08/10/20 1012)   PRN Meds:.sodium chloride, acetaminophen **OR** acetaminophen, albuterol, LORazepam, metoprolol tartrate, morphine injection, ondansetron **OR** ondansetron (ZOFRAN) IV, sodium chloride, traMADol, traZODone   Assessment: Principal Problem:   GI bleeding Active Problems:   IBS (irritable bowel syndrome)   Atrial fibrillation with RVR (HCC)   COPD (chronic obstructive pulmonary disease) (HCC)   Acute respiratory failure with hypoxia and hypercapnia (HCC)   Acute blood loss anemia    Plan: Cardiology has evaluated the patient and patient is an appropriate medication for A. fib with RVR  Patient is agreeable to procedures for evaluation of her presenting complaint of melena and anemia  We will plan on upper endoscopy tomorrow as long as anesthesia agrees to sedate the patient  This is the best clinical condition patient has been in and days since her issue with dyspnea and elevated heart rate for started  I have discussed alternative options, risks & benefits,   which include, but are not limited to, bleeding, infection, perforation,respiratory complication & drug reaction.  The patient agrees with this plan & written consent will be obtained.      LOS: 7 days   Jackie Antigua, MD 08/11/2020, 12:31 PM

## 2020-08-11 NOTE — Progress Notes (Signed)
[facility] Cardiology [facility] Encounter Note  Patient: Sharicka Pogorzelski / Admit Date: 08/04/2020 / Date of Encounter: 08/11/2020, 8:52 AM   Subjective: Patient overall feeling relatively better today than yesterday although still somewhat weak and fatigued with some shortness of breath.  Patient atrial fibrillation heart rate control slightly improved with diltiazem orally and stopped intravenous treatment.  With this would consider further adjustments today.  No evidence of congestive heart failure or anginal symptoms today  Review of Systems: Positive for: Shortness of breath Negative for: Vision change, hearing change, syncope, dizziness, nausea, vomiting,diarrhea, bloody stool, stomach pain, cough, congestion, diaphoresis, urinary frequency, urinary pain,skin lesions, skin rashes Others previously listed  Objective: Telemetry: Atrial fibrillation with more rapid ventricular rate Physical Exam: Blood pressure 119/71, pulse (!) 118, temperature 97.6 F (36.4 C), temperature source Oral, resp. rate (!) 21, height 4\' 11"  (1.499 m), weight 49.8 kg, SpO2 97 %. Body mass index is 22.16 kg/m. General: Well developed, well nourished, in no acute distress. Head: Normocephalic, atraumatic, sclera non-icteric, no xanthomas, nares are without discharge. Neck: No apparent masses Lungs: Normal respirations with 2 wheezes, n some rhonchi, no rales , no crackles   Heart: Irregular rate and rhythm, normal S1 S2, no murmur, no rub, no gallop, PMI is normal size and placement, carotid upstroke normal without bruit, jugular venous pressure normal Abdomen: Soft, non-tender, non-distended with normoactive bowel sounds. No hepatosplenomegaly. Abdominal aorta is normal size without bruit Extremities: Trace edema, no clubbing, no cyanosis, no ulcers,  Peripheral: 2+ radial, 2+ femoral, 2+ dorsal pedal pulses Neuro: Alert and oriented. Moves all extremities spontaneously. Psych:  Responds to questions  appropriately with a normal affect.   Intake/Output Summary (Last 24 hours) at 08/11/2020 0852 Last data filed at 08/11/2020 0500 Gross per 24 hour  Intake 720 ml  Output 200 ml  Net 520 ml    Inpatient Medications:  . Chlorhexidine Gluconate Cloth  6 each Topical Daily  . diltiazem  240 mg Oral Daily  . doxepin  30 mg Oral QHS  . fluticasone furoate-vilanterol  1 puff Inhalation Daily   And  . umeclidinium bromide  1 puff Inhalation Daily  . furosemide  40 mg Oral Daily  . ipratropium-albuterol  3 mL Nebulization Once  . ipratropium-albuterol  3 mL Nebulization TID  . lidocaine  1 patch Transdermal Q24H  . mouth rinse  15 mL Mouth Rinse BID  . metoprolol tartrate  25 mg Oral BID  . oxybutynin  15 mg Oral QHS  . pantoprazole (PROTONIX) IV  40 mg Intravenous Q12H  . pentosan polysulfate  100 mg Oral BID  . potassium chloride  40 mEq Oral Once   Infusions:  . sodium chloride 500 mL (08/10/20 1012)    Labs: Recent Labs    08/09/20 0816 08/09/20 0816 08/10/20 0522 08/11/20 0626  NA 144   < > 146* 149*  K 3.1*   < > 4.1 3.5  CL 90*   < > 96* 95*  CO2 38*   < > 33* 37*  GLUCOSE 102*   < > 108* 121*  BUN 19   < > 28* 33*  CREATININE 0.83   < > 0.79 0.84  CALCIUM 8.8*   < > 9.1 9.2  MG 1.8  --   --  2.2   < > = values in this interval not displayed.   Recent Labs    08/10/20 0522 08/11/20 0626  AST 18 23  ALT 13 14  ALKPHOS 59  39  BILITOT 1.5* 0.9  PROT 6.3* 6.2*  ALBUMIN 3.6 3.6   Recent Labs    08/10/20 0522 08/11/20 0626  WBC 7.4 6.9  HGB 10.6* 11.1*  HCT 34.9* 36.5  MCV 78.3* 78.2*  PLT 331 308   No results for input(s): CKTOTAL, CKMB, TROPONINI in the last 72 hours. Invalid input(s): POCBNP No results for input(s): HGBA1C in the last 72 hours.   Weights: Filed Weights   08/08/20 0300 08/10/20 0507 08/11/20 0700  Weight: 55.1 kg 50 kg 49.8 kg     Radiology/Studies:  CT HEAD WO CONTRAST  Result Date: 08/08/2020 CLINICAL DATA:  Delirium  with altered mental status EXAM: CT HEAD WITHOUT CONTRAST TECHNIQUE: Contiguous axial images were obtained from the base of the skull through the vertex without intravenous contrast. COMPARISON:  None. FINDINGS: Brain: There is age related volume loss. There is no intracranial mass, hemorrhage, extra-axial fluid collection, or midline shift. There is slight small vessel disease in the centra semiovale bilaterally. No acute infarct is evident. Vascular: No hyperdense vessel. There is calcification in each carotid siphon region. Skull: The bony calvarium appears intact. Sinuses/Orbits: There is mucosal thickening in several ethmoid air cells. Other visualized paranasal sinuses are clear. Visualized orbits appear symmetric bilaterally. Other: Mastoid air cells are clear. IMPRESSION: Age related volume loss with mild periventricular small vessel disease. No acute infarct. No mass or hemorrhage. There are foci of arterial vascular calcification. There is mucosal thickening in several ethmoid air cells. Electronically Signed   By: Lowella Grip III M.D.   On: 08/08/2020 13:43   CT ANGIO CHEST PE W OR WO CONTRAST  Result Date: 08/08/2020 CLINICAL DATA:  Chest pain, shortness of breath, pleural effusion, rule out pulmonary embolism EXAM: CT ANGIOGRAPHY CHEST WITH CONTRAST TECHNIQUE: Multidetector CT imaging of the chest was performed using the standard protocol during bolus administration of intravenous contrast. Multiplanar CT image reconstructions and MIPs were obtained to evaluate the vascular anatomy. CONTRAST:  31mL OMNIPAQUE IOHEXOL 350 MG/ML SOLN COMPARISON:  06/12/2020 FINDINGS: Cardiovascular: Satisfactory opacification of the pulmonary arteries to the segmental level. No evidence of pulmonary embolism. Mild cardiomegaly. Trace pericardial effusion. Aortic atherosclerosis Mediastinum/Nodes: No enlarged mediastinal, hilar, or axillary lymph nodes. Thyroid gland, trachea, and esophagus demonstrate no  significant findings. Lungs/Pleura: Moderate bilateral pleural effusions and associated atelectasis or consolidation. Redemonstrated dense scarring of the left lung base. Upper Abdomen: No acute abnormality. Musculoskeletal: No chest wall abnormality. Redemonstrated high-grade wedge deformity of the T10 vertebral body. Review of the MIP images confirms the above findings. IMPRESSION: 1. Negative examination for pulmonary embolism. 2. Moderate bilateral pleural effusions and associated atelectasis or consolidation. 3. Aortic Atherosclerosis (ICD10-I70.0). Electronically Signed   By: Eddie Candle M.D.   On: 08/08/2020 14:13   DG Chest Port 1 View  Result Date: 08/10/2020 CLINICAL DATA:  Hypoxia EXAM: PORTABLE CHEST 1 VIEW COMPARISON:  Chest radiograph August 07, 2020 and chest CT August 08, 2020 FINDINGS: There are persistent pleural effusions bilaterally with atelectasis and ill-defined airspace opacity in the lung bases. There is also atelectatic change in the left mid lung region. There is mild interstitial edema. There is cardiomegaly with pulmonary venous hypertension. No adenopathy. There is aortic atherosclerosis. Monitor device over the left hemithorax anteriorly. T10 vertebral body fracture better seen on recent CT. IMPRESSION: Cardiomegaly with a degree of pulmonary vascular congestion. Pleural effusions with a degree of interstitial edema. Suspect a degree of underlying congestive heart failure. There is atelectatic change with patchy airspace opacity  in the lung bases. There may be a degree of alveolar edema or possible pneumonia in the lung bases. Appearance similar to recent studies. Aortic Atherosclerosis (ICD10-I70.0). Electronically Signed   By: Lowella Grip III M.D.   On: 08/10/2020 08:13   DG Chest Port 1 View  Result Date: 08/07/2020 CLINICAL DATA:  Increased shortness of breath EXAM: PORTABLE CHEST 1 VIEW COMPARISON:  08/06/2020 FINDINGS: Interval increase in diffuse bilateral  interstitial pulmonary opacity. Unchanged elevation of the left hemidiaphragm. Probable small layering bilateral pleural effusions. Mild cardiomegaly. IMPRESSION: Interval increase in diffuse bilateral interstitial pulmonary opacity, which may reflect edema and/or infection. Probable small layering bilateral pleural effusions. Electronically Signed   By: Eddie Candle M.D.   On: 08/07/2020 10:15   DG Chest Port 1 View  Result Date: 08/06/2020 CLINICAL DATA:  Wheezing, anemia EXAM: PORTABLE CHEST 1 VIEW COMPARISON:  06/12/2020 chest radiograph. FINDINGS: Chronic prominent elevation of the left hemidiaphragm. Stable cardiomediastinal silhouette with mild cardiomegaly. No pneumothorax. Small bilateral pleural effusions are increased bilaterally. Patchy consolidation at both lung bases, worsened. IMPRESSION: 1. Chronic prominent left hemidiaphragm elevation. Worsened patchy consolidation at both lung bases, which could represent any combination of atelectasis, aspiration or pneumonia. 2. Mild cardiomegaly. Small bilateral pleural effusions are increased bilaterally. Electronically Signed   By: Ilona Sorrel M.D.   On: 08/06/2020 11:55     Assessment and Recommendation  84 y.o. female with hypertension hyperlipidemia and apparent GI bleed with anemia and possible respiratory infection having acute onset paroxysmal nonvalvular atrial fibrillation with rapid ventricular rate exacerbated by above without evidence of current congestive heart failure or myocardial infarction 1.  Continue oral diltiazem and will add low-dose beta-blocker today for better heart rate control with heart rate between 60 and 90 bpm 2.  Abstain from anticoagulation at this time due to concerns of bleeding complications upper GI bleed and melena 3.  Further evaluation from gastroenterology including upper endoscopy colonoscopy as necessary 4.  Continue supportive care of pulmonary infection 5.  Consider echocardiogram for LV systolic  dysfunction valvular heart disease contributing to above 6.  Further treatment options after above  Signed, Serafina Royals M.D. FACC

## 2020-08-11 NOTE — Progress Notes (Signed)
   08/11/20 0200  Vitals  BP 117/73  MAP (mmHg) 84  Pulse Rate (!) 103  ECG Heart Rate (!) 107  Resp (!) 27

## 2020-08-12 ENCOUNTER — Encounter
Admission: EM | Disposition: A | Discharge status: Home Health | Payer: Self-pay | Source: Home / Self Care | Attending: Internal Medicine

## 2020-08-12 ENCOUNTER — Inpatient Hospital Stay: Payer: Medicare HMO | Admitting: Anesthesiology

## 2020-08-12 ENCOUNTER — Encounter: Payer: Self-pay | Admitting: Family Medicine

## 2020-08-12 ENCOUNTER — Inpatient Hospital Stay: Admit: 2020-08-12 | Payer: Medicare HMO

## 2020-08-12 DIAGNOSIS — D500 Iron deficiency anemia secondary to blood loss (chronic): Secondary | ICD-10-CM

## 2020-08-12 HISTORY — PX: ESOPHAGOGASTRODUODENOSCOPY: SHX5428

## 2020-08-12 LAB — FERRITIN: Ferritin: 19 ng/mL (ref 11–307)

## 2020-08-12 LAB — BASIC METABOLIC PANEL
Anion gap: 10 (ref 5–15)
BUN: 28 mg/dL — ABNORMAL HIGH (ref 8–23)
CO2: 37 mmol/L — ABNORMAL HIGH (ref 22–32)
Calcium: 8.6 mg/dL — ABNORMAL LOW (ref 8.9–10.3)
Chloride: 95 mmol/L — ABNORMAL LOW (ref 98–111)
Creatinine, Ser: 0.73 mg/dL (ref 0.44–1.00)
GFR calc Af Amer: >60 mL/min (ref >60)
GFR calc non Af Amer: >60 mL/min (ref >60)
Glucose, Bld: 119 mg/dL — ABNORMAL HIGH (ref 70–99)
Potassium: 3.5 mmol/L (ref 3.5–5.1)
Sodium: 142 mmol/L (ref 135–145)

## 2020-08-12 LAB — CBC
HCT: 32.3 % — ABNORMAL LOW (ref 36.0–46.0)
Hemoglobin: 9.4 g/dL — ABNORMAL LOW (ref 12.0–15.0)
MCH: 23.5 pg — ABNORMAL LOW (ref 26.0–34.0)
MCHC: 29.1 g/dL — ABNORMAL LOW (ref 30.0–36.0)
MCV: 80.8 fL (ref 80.0–100.0)
Platelets: 258 10*3/uL (ref 150–400)
RBC: 4 MIL/uL (ref 3.87–5.11)
RDW: 19.1 % — ABNORMAL HIGH (ref 11.5–15.5)
WBC: 5.3 10*3/uL (ref 4.0–10.5)
nRBC: 0 % (ref 0.0–0.2)

## 2020-08-12 LAB — IRON AND TIBC
Iron: 17 ug/dL — ABNORMAL LOW (ref 28–170)
Saturation Ratios: 4 % — ABNORMAL LOW (ref 10.4–31.8)
TIBC: 487 ug/dL — ABNORMAL HIGH (ref 250–450)
UIBC: 470 ug/dL

## 2020-08-12 SURGERY — EGD (ESOPHAGOGASTRODUODENOSCOPY)
Anesthesia: General

## 2020-08-12 MED ORDER — PROPOFOL 500 MG/50ML IV EMUL
INTRAVENOUS | Status: AC
  Filled 2020-08-12: qty 50

## 2020-08-12 MED ORDER — PROPOFOL 10 MG/ML IV BOLUS
INTRAVENOUS | Status: DC | PRN
  Administered 2020-08-12 (×2): 20 mg via INTRAVENOUS

## 2020-08-12 MED ORDER — PANTOPRAZOLE SODIUM 40 MG PO TBEC
40.0000 mg | DELAYED_RELEASE_TABLET | Freq: Every day | ORAL | Status: DC
  Administered 2020-08-13 – 2020-08-15 (×3): 40 mg via ORAL
  Filled 2020-08-12 (×3): qty 1

## 2020-08-12 MED ORDER — VITAMIN B-12 1000 MCG PO TABS
1000.0000 ug | ORAL_TABLET | Freq: Every day | ORAL | Status: DC
  Administered 2020-08-12 – 2020-08-15 (×4): 1000 ug via ORAL
  Filled 2020-08-12 (×5): qty 1

## 2020-08-12 MED ORDER — PEG 3350-KCL-NA BICARB-NACL 420 G PO SOLR
4000.0000 mL | Freq: Once | ORAL | Status: AC
  Administered 2020-08-12: 4000 mL via ORAL
  Filled 2020-08-12: qty 4000

## 2020-08-12 MED ORDER — MAGNESIUM CITRATE PO SOLN
1.0000 | Freq: Once | ORAL | Status: AC
  Administered 2020-08-12: 1 via ORAL
  Filled 2020-08-12: qty 296

## 2020-08-12 MED ORDER — SODIUM CHLORIDE 0.9 % IV SOLN: INTRAVENOUS | Status: DC

## 2020-08-12 MED ORDER — METOPROLOL TARTRATE 50 MG PO TABS
50.0000 mg | ORAL_TABLET | Freq: Two times a day (BID) | ORAL | Status: DC
  Administered 2020-08-12 – 2020-08-13 (×2): 50 mg via ORAL
  Filled 2020-08-12 (×4): qty 1

## 2020-08-12 MED ORDER — LACTATED RINGERS IV SOLN: INTRAVENOUS | Status: DC | PRN

## 2020-08-12 NOTE — Anesthesia Postprocedure Evaluation (Signed)
Anesthesia Post Note  Patient: Jackie Turner  Procedure(s) Performed: ESOPHAGOGASTRODUODENOSCOPY (EGD) (N/A )  Patient location during evaluation: Endoscopy Anesthesia Type: General Level of consciousness: awake and alert Pain management: pain level controlled Vital Signs Assessment: post-procedure vital signs reviewed and stable Respiratory status: spontaneous breathing, nonlabored ventilation, respiratory function stable and patient connected to nasal cannula oxygen Cardiovascular status: blood pressure returned to baseline and stable Postop Assessment: no apparent nausea or vomiting Anesthetic complications: no   No complications documented.   Last Vitals:  Vitals:   08/12/20 1147 08/12/20 1153  BP:    Pulse: 100 (!) 103  Resp: 16   Temp: 37 C   SpO2: 99% 100%    Last Pain:  Vitals:   08/12/20 0800  TempSrc: Oral  PainSc: 0-No pain                 Arita Miss

## 2020-08-12 NOTE — Progress Notes (Signed)
[facility] Cardiology [facility] Encounter Note  Patient: Jackie Turner / Admit Date: 08/04/2020 / Date of Encounter: 08/12/2020, 10:15 AM   Subjective: Patient overall feeling relatively better today than yesterday although still somewhat weak and fatigued with some shortness of breath but apparently closer to her baseline at home.  Patient atrial fibrillation heart rate control slightly improved with diltiazem orally and stopped intravenous treatment and increasing medication management in combination.    No evidence of congestive heart failure or anginal symptoms today  Review of Systems: Positive for: Shortness of breath Negative for: Vision change, hearing change, syncope, dizziness, nausea, vomiting,diarrhea, bloody stool, stomach pain, cough, congestion, diaphoresis, urinary frequency, urinary pain,skin lesions, skin rashes Others previously listed  Objective: Telemetry: Atrial fibrillation with more rapid ventricular rate Physical Exam: Blood pressure 108/65, pulse 80, temperature 98.4 F (36.9 C), temperature source Oral, resp. rate (!) 26, height 4\' 11"  (1.499 m), weight 50.9 kg, SpO2 99 %. Body mass index is 22.66 kg/m. General: Well developed, well nourished, in no acute distress. Head: Normocephalic, atraumatic, sclera non-icteric, no xanthomas, nares are without discharge. Neck: No apparent masses Lungs: Normal respirations with 2 wheezes, n some rhonchi, no rales , no crackles   Heart: Irregular rate and rhythm, normal S1 S2, no murmur, no rub, no gallop, PMI is normal size and placement, carotid upstroke normal without bruit, jugular venous pressure normal Abdomen: Soft, non-tender, non-distended with normoactive bowel sounds. No hepatosplenomegaly. Abdominal aorta is normal size without bruit Extremities: Trace edema, no clubbing, no cyanosis, no ulcers,  Peripheral: 2+ radial, 2+ femoral, 2+ dorsal pedal pulses Neuro: Alert and oriented. Moves all extremities  spontaneously. Psych:  Responds to questions appropriately with a normal affect.   Intake/Output Summary (Last 24 hours) at 08/12/2020 1015 Last data filed at 08/12/2020 0931 Gross per 24 hour  Intake 240 ml  Output 1152 ml  Net -912 ml    Inpatient Medications:  . Chlorhexidine Gluconate Cloth  6 each Topical Daily  . diltiazem  240 mg Oral Daily  . doxepin  30 mg Oral QHS  . fluticasone furoate-vilanterol  1 puff Inhalation Daily   And  . umeclidinium bromide  1 puff Inhalation Daily  . furosemide  40 mg Oral Daily  . ipratropium-albuterol  3 mL Nebulization Once  . ipratropium-albuterol  3 mL Nebulization TID  . lidocaine  1 patch Transdermal Q24H  . mouth rinse  15 mL Mouth Rinse BID  . metoprolol tartrate  50 mg Oral BID  . oxybutynin  15 mg Oral QHS  . pantoprazole (PROTONIX) IV  40 mg Intravenous Q12H  . pentosan polysulfate  100 mg Oral BID   Infusions:  . sodium chloride 500 mL (08/10/20 1012)    Labs: Recent Labs    08/11/20 0626 08/12/20 0613  NA 149* 142  K 3.5 3.5  CL 95* 95*  CO2 37* 37*  GLUCOSE 121* 119*  BUN 33* 28*  CREATININE 0.84 0.73  CALCIUM 9.2 8.6*  MG 2.2  --    Recent Labs    08/10/20 0522 08/11/20 0626  AST 18 23  ALT 13 14  ALKPHOS 59 58  BILITOT 1.5* 0.9  PROT 6.3* 6.2*  ALBUMIN 3.6 3.6   Recent Labs    08/11/20 0626 08/12/20 0613  WBC 6.9 5.3  HGB 11.1* 9.4*  HCT 36.5 32.3*  MCV 78.2* 80.8  PLT 308 258   No results for input(s): CKTOTAL, CKMB, TROPONINI in the last 72 hours. Invalid input(s): POCBNP  No results for input(s): HGBA1C in the last 72 hours.   Weights: Filed Weights   08/10/20 0507 08/11/20 0700 08/12/20 0511  Weight: 50 kg 49.8 kg 50.9 kg     Radiology/Studies:  CT HEAD WO CONTRAST  Result Date: 08/08/2020 CLINICAL DATA:  Delirium with altered mental status EXAM: CT HEAD WITHOUT CONTRAST TECHNIQUE: Contiguous axial images were obtained from the base of the skull through the vertex without  intravenous contrast. COMPARISON:  None. FINDINGS: Brain: There is age related volume loss. There is no intracranial mass, hemorrhage, extra-axial fluid collection, or midline shift. There is slight small vessel disease in the centra semiovale bilaterally. No acute infarct is evident. Vascular: No hyperdense vessel. There is calcification in each carotid siphon region. Skull: The bony calvarium appears intact. Sinuses/Orbits: There is mucosal thickening in several ethmoid air cells. Other visualized paranasal sinuses are clear. Visualized orbits appear symmetric bilaterally. Other: Mastoid air cells are clear. IMPRESSION: Age related volume loss with mild periventricular small vessel disease. No acute infarct. No mass or hemorrhage. There are foci of arterial vascular calcification. There is mucosal thickening in several ethmoid air cells. Electronically Signed   By: Lowella Grip III M.D.   On: 08/08/2020 13:43   CT ANGIO CHEST PE W OR WO CONTRAST  Result Date: 08/08/2020 CLINICAL DATA:  Chest pain, shortness of breath, pleural effusion, rule out pulmonary embolism EXAM: CT ANGIOGRAPHY CHEST WITH CONTRAST TECHNIQUE: Multidetector CT imaging of the chest was performed using the standard protocol during bolus administration of intravenous contrast. Multiplanar CT image reconstructions and MIPs were obtained to evaluate the vascular anatomy. CONTRAST:  74mL OMNIPAQUE IOHEXOL 350 MG/ML SOLN COMPARISON:  06/12/2020 FINDINGS: Cardiovascular: Satisfactory opacification of the pulmonary arteries to the segmental level. No evidence of pulmonary embolism. Mild cardiomegaly. Trace pericardial effusion. Aortic atherosclerosis Mediastinum/Nodes: No enlarged mediastinal, hilar, or axillary lymph nodes. Thyroid gland, trachea, and esophagus demonstrate no significant findings. Lungs/Pleura: Moderate bilateral pleural effusions and associated atelectasis or consolidation. Redemonstrated dense scarring of the left lung  base. Upper Abdomen: No acute abnormality. Musculoskeletal: No chest wall abnormality. Redemonstrated high-grade wedge deformity of the T10 vertebral body. Review of the MIP images confirms the above findings. IMPRESSION: 1. Negative examination for pulmonary embolism. 2. Moderate bilateral pleural effusions and associated atelectasis or consolidation. 3. Aortic Atherosclerosis (ICD10-I70.0). Electronically Signed   By: Eddie Candle M.D.   On: 08/08/2020 14:13   DG Chest Port 1 View  Result Date: 08/10/2020 CLINICAL DATA:  Hypoxia EXAM: PORTABLE CHEST 1 VIEW COMPARISON:  Chest radiograph August 07, 2020 and chest CT August 08, 2020 FINDINGS: There are persistent pleural effusions bilaterally with atelectasis and ill-defined airspace opacity in the lung bases. There is also atelectatic change in the left mid lung region. There is mild interstitial edema. There is cardiomegaly with pulmonary venous hypertension. No adenopathy. There is aortic atherosclerosis. Monitor device over the left hemithorax anteriorly. T10 vertebral body fracture better seen on recent CT. IMPRESSION: Cardiomegaly with a degree of pulmonary vascular congestion. Pleural effusions with a degree of interstitial edema. Suspect a degree of underlying congestive heart failure. There is atelectatic change with patchy airspace opacity in the lung bases. There may be a degree of alveolar edema or possible pneumonia in the lung bases. Appearance similar to recent studies. Aortic Atherosclerosis (ICD10-I70.0). Electronically Signed   By: Lowella Grip III M.D.   On: 08/10/2020 08:13   DG Chest Port 1 View  Result Date: 08/07/2020 CLINICAL DATA:  Increased shortness of breath EXAM:  PORTABLE CHEST 1 VIEW COMPARISON:  08/06/2020 FINDINGS: Interval increase in diffuse bilateral interstitial pulmonary opacity. Unchanged elevation of the left hemidiaphragm. Probable small layering bilateral pleural effusions. Mild cardiomegaly. IMPRESSION: Interval  increase in diffuse bilateral interstitial pulmonary opacity, which may reflect edema and/or infection. Probable small layering bilateral pleural effusions. Electronically Signed   By: Eddie Candle M.D.   On: 08/07/2020 10:15   DG Chest Port 1 View  Result Date: 08/06/2020 CLINICAL DATA:  Wheezing, anemia EXAM: PORTABLE CHEST 1 VIEW COMPARISON:  06/12/2020 chest radiograph. FINDINGS: Chronic prominent elevation of the left hemidiaphragm. Stable cardiomediastinal silhouette with mild cardiomegaly. No pneumothorax. Small bilateral pleural effusions are increased bilaterally. Patchy consolidation at both lung bases, worsened. IMPRESSION: 1. Chronic prominent left hemidiaphragm elevation. Worsened patchy consolidation at both lung bases, which could represent any combination of atelectasis, aspiration or pneumonia. 2. Mild cardiomegaly. Small bilateral pleural effusions are increased bilaterally. Electronically Signed   By: Ilona Sorrel M.D.   On: 08/06/2020 11:55     Assessment and Recommendation  84 y.o. female with hypertension hyperlipidemia and apparent GI bleed with anemia and possible respiratory infection having acute onset paroxysmal nonvalvular atrial fibrillation with rapid ventricular rate exacerbated by above without evidence of current congestive heart failure or myocardial infarction currently at lowest risk possible for endoscopy for further evaluation of bleeding complications 1.  Continue oral diltiazem and will increase beta-blocker today for better heart rate control with heart rate between 60 and 90 bpm 2.  Abstain from anticoagulation at this time due to concerns of bleeding complications upper GI bleed and melena 3.  Further evaluation from gastroenterology including upper endoscopy colonoscopy as necessary without restriction at this time due to lowest risk possible 4.  Continue supportive care of pulmonary infection  Signed, Serafina Royals M.D. FACC

## 2020-08-12 NOTE — Transfer of Care (Signed)
Immediate Anesthesia Transfer of Care Note  Patient: Jackie Turner  Procedure(s) Performed: ESOPHAGOGASTRODUODENOSCOPY (EGD) (N/A )  Patient Location: PACU  Anesthesia Type:General  Level of Consciousness: awake, alert  and oriented  Airway & Oxygen Therapy: Patient Spontanous Breathing  Post-op Assessment: Report given to RN  Post vital signs: Reviewed and stable  Last Vitals:  Vitals Value Taken Time  BP 99/65 08/12/20 1145  Temp    Pulse 109 08/12/20 1145  Resp    SpO2 100 % 08/12/20 1145    Last Pain:  Vitals:   08/12/20 0800  TempSrc: Oral  PainSc:          Complications: No complications documented.

## 2020-08-12 NOTE — Progress Notes (Signed)
PROGRESS NOTE    Jackie Turner   TKP:[id number]  DOB: [birth year]  PCP: Mar Daring, PA-C    DOA: 08/04/2020 LOS: 8   Brief Narrative   Admission records reviewed and summarized:  Jackie Turner  is a 84 y.o. female with a known history of asthma, dyslipidemia, atrial fibrillation on Eliquis and osteoporosis, who presented to the emergency room with recurrent dark stools, having recently been started on Eliquis for A-fib in early July.    In the ED, temperature was 100.2, RR 25, BP 145/87 with HR 79, and O2 sat  90's% on 4 L nasal cannula oxygen.  CMP was unremarkable aside from glucose of 172.  CBC showed Hbg 6.3, Hct 21.5 (down from 11.4 and 35.8 on 06/22/2020). EKG showed A-fib with RVR at 104 bpm.  Treated with IV Protonix in the ED and admitted to hospitalist service with GI consulted for evaluation of GI bleeding.  Per Dr. Esmeralda Links Patel's note on 08/08/20:  "From August 05, 2020 till August 08, 2020 patient was in my care. Initial first 3 days of hospitalization patient was stable day 3 of hospitalization patient was dyspneic, and was managed immediately with IV diuretic therapy with a stat chest x-ray for possible aspiration versus volume overload.  As we diuresed patient she had good output of 1 L with the initial dose of Lasix, chest x-ray showed pulmonary vascular congestion with questionable pneumonia.  She was continued on IV Lasix therapy, evaluation was deferred until patient's pulmonary status was stable and she was not in respiratory failure.  Stat ABG showed hypercapnia with PCO2 in the 70s, and patient was confused and agitated at that time requiring 24 hours of sedation patient had as needed Haldol and Ativan given to her.  Patient tolerated BiPAP therapy.  Lasix therapy was scheduled and an additional dose of 20 mg was given to her today at noon time.  Since diuretic therapies and IV antibiotic initiation patient has been stable she is currently alert awake oriented  patient's decline in acute respiratory failure has been discussed with granddaughter [name] who was here yesterday when patient presented with symptoms of shortness of breath.  Discussed with nurse plan of ongoing BiPAP therapy and repeat ABG.  We will also obtain head CT, CTA to rule out any VTE or new stroke.   CT of the head noncontrast is negative for any acute events.  CTA of the chest is negative for any pulmonary embolism and shows pleural effusion and possible consolidation.  Plan now is to continue IV antibiotic therapy, wean patient off of BiPAP therapy to nasal cannula.  Proceed with GI evaluation will defer to GI for inpatient or outpatient versus condition of the patient as I am going off service.  Overall patient has improved expect discharge in the next 72 hours.  Will obtain physical therapy consult and OT consult.  Granddaughter was interested in case management for options of possible placement."       Assessment & Plan   Principal Problem:   GI bleeding Active Problems:   Atrial fibrillation with RVR (HCC)   Acute respiratory failure with hypoxia and hypercapnia (HCC)   Acute blood loss anemia   COPD (chronic obstructive pulmonary disease) (HCC)   IBS (irritable bowel syndrome)   Acute respiratory failure with hypoxia and hypercapnia - secondary to COPD, CHF and possibly PNA given consolidation on imaging.  Bipap PRN.  Supplemental O2 to keep O2 sat > 90%.  Further plans as below.  Acute on Chronic Diastolic CHF - last echo June 2021 showed EF 60-65% with grade 1 diastolic dysfunction.  Monitor I/O's and daily weights.  Cardiology following, recommends low dose PO Lasix daily.   Repeat echo is pending.  Atrial fibrillation with RVR - most likely due to anemia.  CHA2DS2-VASc score is 4.   Patient's heart rates during encounter this morning 90s to 120s.   Per RN, rate increases significantly on exertion and patient gets short of breath.. Eliquis held due to GI bleed  concerns.   Cardiology consulted.   Continue p.o. Cardizem 60 mg q6h. beta-blocker was increased IV Lopressor PRN for rate control.   Maintain K>4.0 and Mg >2.0.  Hypernatremia -not present on admission, likely due to diuresis and poor intake of water.  Resolved.  New on 8/26 Na 146 >> 149 >> 142 today.  Encourage patient to drink water.  Will avoid D5W infusion given need for diuresis.  BMP in AM.  Anion gap metabolic acidosis -not present on admission.  Likely due to diuresis.  Monitor.  Community-Acquired Pneumonia - .  Had been started on Rocephin and Flagyl.  Stopped Flagyl as anaerobic coverage no longer recommended for aspiration.  Received 4 days antibiotics, afebrile, no leukocytosis, and procal < 0.10, so stopped Rocephin 8.26.  Monitor clinically.    GI bleeding with Acute Blood Loss Anemia - POA. Anemia improving and no further bleeding seen.  Has history of PUD with bleeding many years ago.  Hold Eliquis.  Serial H&H's.  Transfuse in Hbg < 7.0.  (Has received 2 units RBC's so far).  Continue IV Protonix BID for now.   Page GI if any signs of active bleeding.  EGD on 8/28 was fairly unremarkable and no source of bleeding was seen.   Plan is for colonoscopy tomorrow.  Start bowel prep.  Clear liquid diet today.  Hypokalemia - resolved.  K 3.1 on 8/25, replaced.  Monitor and replace as needed.   COPD - not acutely exacerbated.  Duonebs scheduled and PRN.  Bipap nightly.  IBS with constipation - chronic, patient says has not had any constipation since starting Eliquis in July.  Monitor.    DVT prophylaxis: SCDs Start: 08/04/20 2314   Diet:  Diet Orders (From admission, onward)    Start     Ordered   08/12/20 0001  Diet NPO time specified Except for: Sips with Meds  Diet effective midnight       Question:  Except for  Answer:  Ferrel Logan with Meds   08/11/20 1234            Code Status: Full Code    Subjective 08/12/20    Patient seen up in chair this AM.  She reports she  feels well.  Is waiting to go down for EGD.  Denies fevers or chills, cough or congestion, chest pain, shortness of breath, nausea vomiting or other acute complaints.  Disposition Plan & Communication   Status is: Inpatient  Remains inpatient appropriate because:Inpatient level of care appropriate due to severity of illness.  Colonoscopy planned for tomorrow and heart rate remains uncontrolled, patient symptomatic with ongoing A. Fib.  Dispo: The patient is from: Home              Anticipated d/c is to: Home with home health              Anticipated d/c date is: 2 days  Patient currently is not medically stable to d/c.  Family Communication: spoke with granddaughter [name] by phone this afternoon   Consults, Procedures, Significant Events   Consultants:   Gastroenterology  Cardiology  Procedures:   None  Antimicrobials:   Rocephin 8/23 >> 8/26   Objective   Vitals:   08/11/20 1936 08/11/20 2140 08/12/20 0400 08/12/20 0511  BP:  118/70 102/77   Pulse: 93  99   Resp: 18  (!) 26   Temp:      TempSrc:      SpO2: 93%  97%   Weight:    50.9 kg  Height:        Intake/Output Summary (Last 24 hours) at 08/12/2020 0752 Last data filed at 08/11/2020 2317 Gross per 24 hour  Intake 240 ml  Output 951 ml  Net -711 ml   Filed Weights   08/10/20 0507 08/11/20 0700 08/12/20 0511  Weight: 50 kg 49.8 kg 50.9 kg    Physical Exam:  General exam: awake, alert, no acute distress Respiratory system: CTAB with diminished bases, normal respiratory effort at rest, on 2 L/min oxygen. Cardiovascular system: normal S1/S2, irregularly irregular, no pedal edema.   Central nervous system: A&O x3. no gross focal neurologic deficits, normal speech Extremities: moves all, no cyanosis, normal tone, no edema   Labs   Data Reviewed: I have personally reviewed following labs and imaging studies  CBC: Recent Labs  Lab 08/06/20 1019 08/07/20 0543 08/08/20 0449  08/09/20 0816 08/10/20 0522 08/11/20 0626 08/12/20 0613  WBC 7.7   < > 7.3 7.8 7.4 6.9 5.3  NEUTROABS 6.0  --  5.2  --   --   --   --   HGB 9.5*   < > 8.9* 10.3* 10.6* 11.1* 9.4*  HCT 32.5*   < > 30.4* 33.2* 34.9* 36.5 32.3*  MCV 80.6   < > 81.1 76.9* 78.3* 78.2* 80.8  PLT 293   < > 265 307 331 308 258   < > = values in this interval not displayed.   Basic Metabolic Panel: Recent Labs  Lab 08/06/20 1019 08/06/20 1019 08/07/20 0543 08/07/20 0543 08/07/20 1522 08/08/20 0449 08/09/20 0816 08/10/20 0522 08/11/20 0626  NA 142   < > 144   < > 144 146* 144 146* 149*  K 3.8   < > 4.0   < > 3.7 3.3* 3.1* 4.1 3.5  CL 102   < > 102   < > 99 99 90* 96* 95*  CO2 30   < > 31   < > 33* 36* 38* 33* 37*  GLUCOSE 128*   < > 124*   < > 144* 99 102* 108* 121*  BUN 13   < > 12   < > 14 16 19  28* 33*  CREATININE 0.69   < > 0.67   < > 0.59 0.64 0.83 0.79 0.84  CALCIUM 8.6*   < > 8.8*   < > 8.2* 8.3* 8.8* 9.1 9.2  MG 1.9   < > 2.0  --  1.7 2.2 1.8  --  2.2  PHOS 2.7  --  2.7  --   --  2.3*  --   --   --    < > = values in this interval not displayed.   GFR: Estimated Creatinine Clearance: 35.2 mL/min (by C-G formula based on SCr of 0.84 mg/dL). Liver Function Tests: Recent Labs  Lab 08/06/20 1019 08/08/20 0449 08/09/20 0816 08/10/20 0522 08/11/20  0626  AST 18 15 18 18 23   ALT 12 11 14 13 14   ALKPHOS 60 60 62 59 58  BILITOT 0.8 0.7 1.8* 1.5* 0.9  PROT 6.1* 5.8* 5.8* 6.3* 6.2*  ALBUMIN 3.5 3.3* 3.4* 3.6 3.6   No results for input(s): LIPASE, AMYLASE in the last 168 hours. No results for input(s): AMMONIA in the last 168 hours. Coagulation Profile: No results for input(s): INR, PROTIME in the last 168 hours. Cardiac Enzymes: No results for input(s): CKTOTAL, CKMB, CKMBINDEX, TROPONINI in the last 168 hours. BNP (last 3 results) No results for input(s): PROBNP in the last 8760 hours. HbA1C: No results for input(s): HGBA1C in the last 72 hours. CBG: Recent Labs  Lab  08/05/20 2204 08/06/20 0806 08/06/20 1203 08/06/20 1657 08/06/20 2153  GLUCAP 103* 102* 100* 110* 82   Lipid Profile: No results for input(s): CHOL, HDL, LDLCALC, TRIG, CHOLHDL, LDLDIRECT in the last 72 hours. Thyroid Function Tests: No results for input(s): TSH, T4TOTAL, FREET4, T3FREE, THYROIDAB in the last 72 hours. Anemia Panel: No results for input(s): VITAMINB12, FOLATE, FERRITIN, TIBC, IRON, RETICCTPCT in the last 72 hours. Sepsis Labs: Recent Labs  Lab 08/10/20 0522 08/11/20 0626  PROCALCITON <0.10 <0.10    Recent Results (from the past 240 hour(s))  SARS Coronavirus 2 by RT PCR (hospital order, performed in [facility] hospital lab) Nasopharyngeal Nasopharyngeal Swab     Status: None   Collection Time: 08/04/20 10:53 PM   Specimen: Nasopharyngeal Swab  Result Value Ref Range Status   SARS Coronavirus 2 NEGATIVE NEGATIVE Final    Comment: (NOTE) SARS-CoV-2 target nucleic acids are NOT DETECTED.  The SARS-CoV-2 RNA is generally detectable in upper and lower respiratory specimens during the acute phase of infection. The lowest concentration of SARS-CoV-2 viral copies this assay can detect is 250 copies / mL. A negative result does not preclude SARS-CoV-2 infection and should not be used as the sole basis for treatment or other patient management decisions.  A negative result may occur with improper specimen collection / handling, submission of specimen other than nasopharyngeal swab, presence of viral mutation(s) within the areas targeted by this assay, and inadequate number of viral copies (<250 copies / mL). A negative result must be combined with clinical observations, patient history, and epidemiological information.  Fact Sheet for Patients:   StrictlyIdeas.no  Fact Sheet for Healthcare Providers: BankingDealers.co.za  This test is not yet approved or  cleared by the [place] FDA and has been authorized  for detection and/or diagnosis of SARS-CoV-2 by FDA under an Emergency Use Authorization (EUA).  This EUA will remain in effect (meaning this test can be used) for the duration of the COVID-19 declaration under Section 564(b)(1) of the Act, 21 U.S.C. section 360bbb-3(b)(1), unless the authorization is terminated or revoked sooner.  Performed at [facility], [address], [place], [place] [address]       Imaging Studies   No results found.   Medications   Scheduled Meds: . Chlorhexidine Gluconate Cloth  6 each Topical Daily  . diltiazem  240 mg Oral Daily  . doxepin  30 mg Oral QHS  . fluticasone furoate-vilanterol  1 puff Inhalation Daily   And  . umeclidinium bromide  1 puff Inhalation Daily  . furosemide  40 mg Oral Daily  . ipratropium-albuterol  3 mL Nebulization Once  . ipratropium-albuterol  3 mL Nebulization TID  . lidocaine  1 patch Transdermal Q24H  . mouth rinse  15 mL Mouth  Rinse BID  . metoprolol tartrate  25 mg Oral BID  . oxybutynin  15 mg Oral QHS  . pantoprazole (PROTONIX) IV  40 mg Intravenous Q12H  . pentosan polysulfate  100 mg Oral BID   Continuous Infusions: . sodium chloride 500 mL (08/10/20 1012)       LOS: 8 days    Time spent: 30 minutes with greater than 50% spent in coordination of care and direct patient contact.    Ezekiel Slocumb, DO [facility]  08/12/2020, 7:52 AM    If 7PM-7AM, please contact night-coverage. How to contact the [facility] Attending or Consulting provider [place] or covering provider during after hours [place], for this patient?    1. Check the care team in [facility] and look for a) attending/consulting [facility] provider listed and b) the [facility] team listed 2. Log into [email or web address] and use [place]'s universal password to access. If you do not have the password, please contact the hospital operator. 3. Locate the [facility] provider you are looking for under [facility] and page to a number that you can be  directly reached. 4. If you still have difficulty reaching the provider, please page the [facility] (Director on Call) for the Hospitalists listed on amion for assistance.

## 2020-08-12 NOTE — Anesthesia Preprocedure Evaluation (Signed)
Anesthesia Evaluation  Patient identified by MRN, date of birth, ID band Patient awake  General Assessment Comment:Patient on nasal cannula, in no distress  Reviewed: Allergy & Precautions, NPO status , Patient's Chart, lab work & pertinent test results  History of Anesthesia Complications Negative for: history of anesthetic complications  Airway Mallampati: II  TM Distance: >3 FB Neck ROM: Full    Dental  (+) Chipped, Poor Dentition, Dental Advisory Given   Pulmonary asthma , neg sleep apnea, COPD,  COPD inhaler and oxygen dependent, Patient abstained from smoking.Not current smoker,     + decreased breath sounds      Cardiovascular Exercise Tolerance: Poor METS(-) hypertension(-) CAD and (-) Past MI + dysrhythmias Atrial Fibrillation  Rhythm:Irregular Rate:Tachycardia - Systolic murmurs    Neuro/Psych  Headaches, PSYCHIATRIC DISORDERS Depression    GI/Hepatic PUD, GERD  ,(+)     (-) substance abuse  ,   Endo/Other  neg diabetes  Renal/GU negative Renal ROS     Musculoskeletal   Abdominal   Peds  Hematology  (+) anemia ,   Anesthesia Other Findings Past Medical History: No date: Allergy No date: Asthma No date: Hyperlipidemia No date: Osteoporosis  Reproductive/Obstetrics                             Anesthesia Physical Anesthesia Plan  ASA: III  Anesthesia Plan: General   Post-op Pain Management:    Induction: Intravenous  PONV Risk Score and Plan: 3 and Ondansetron, Propofol infusion and TIVA  Airway Management Planned: Natural Airway  Additional Equipment: None  Intra-op Plan:   Post-operative Plan:   Informed Consent: I have reviewed the patients History and Physical, chart, labs and discussed the procedure including the risks, benefits and alternatives for the proposed anesthesia with the patient or authorized representative who has indicated his/her understanding  and acceptance.     Dental advisory given  Plan Discussed with: CRNA and Surgeon  Anesthesia Plan Comments: (Discussed risks of anesthesia with patient, including possibility of difficulty with spontaneous ventilation under anesthesia necessitating airway intervention, PONV, and rare risks such as cardiac or respiratory or neurological events. Patient understands. Patient counseled on being higher risk for anesthesia due to comorbidities: oxygen-dependent COPD, Afib w RVR. Patient was told about increased risk of cardiac and respiratory events, including death. Patient understands. )        Anesthesia Quick Evaluation

## 2020-08-12 NOTE — Op Note (Signed)
[facility] Gastroenterology Patient Name: Jackie Turner Procedure Date: 08/12/2020 11:08 AM MRN: [id number] Account #: [id number] Date of Birth: February 21, 1936 Admit Type: Inpatient Age: 84 Room: [facility] ENDO ROOM 4 Gender: Female Note Status: Finalized Procedure:             Upper GI endoscopy Indications:           Melena Providers:             Lin Landsman MD, MD Referring MD:          Mar Daring (Referring MD) Medicines:             Monitored Anesthesia Care Complications:         No immediate complications. Estimated blood loss: None. Procedure:             Pre-Anesthesia Assessment:                        - Prior to the procedure, a History and Physical was                         performed, and patient medications and allergies were                         reviewed. The patient is competent. The risks and                         benefits of the procedure and the sedation options and                         risks were discussed with the patient. All questions                         were answered and informed consent was obtained.                         Patient identification and proposed procedure were                         verified by the physician, the nurse, the                         anesthesiologist, the anesthetist and the technician                         in the pre-procedure area in the procedure room in the                         endoscopy suite. Mental Status Examination: alert and                         oriented. Airway Examination: normal oropharyngeal                         airway and neck mobility. Respiratory Examination:                         clear to auscultation. CV Examination: irregularly  irregular rate and rhythm. Prophylactic Antibiotics:                         The patient does not require prophylactic antibiotics.                         Prior Anticoagulants: The patient has taken Eliquis                          (apixaban), last dose was 7 days prior to procedure.                         ASA Grade Assessment: III - A patient with severe                         systemic disease. After reviewing the risks and                         benefits, the patient was deemed in satisfactory                         condition to undergo the procedure. The anesthesia                         plan was to use monitored anesthesia care (MAC).                         Immediately prior to administration of medications,                         the patient was re-assessed for adequacy to receive                         sedatives. The heart rate, respiratory rate, oxygen                         saturations, blood pressure, adequacy of pulmonary                         ventilation, and response to care were monitored                         throughout the procedure. The physical status of the                         patient was re-assessed after the procedure.                        After obtaining informed consent, the endoscope was                         passed under direct vision. Throughout the procedure,                         the patient's blood pressure, pulse, and oxygen                         saturations were monitored continuously. The Endoscope  was introduced through the mouth, and advanced to the                         second part of duodenum. The upper GI endoscopy was                         accomplished without difficulty. The patient tolerated                         the procedure well. Findings:      The duodenal bulb and second portion of the duodenum were normal.      Diffuse mildly erythematous mucosa without bleeding was found in the       gastric antrum. Biopsies were taken with a cold forceps for Helicobacter       pylori testing.      The entire examined stomach was normal. Biopsies were taken with a cold       forceps for Helicobacter pylori  testing.      The cardia and gastric fundus were normal on retroflexion.      The gastroesophageal junction and examined esophagus were normal. Impression:            - Normal duodenal bulb and second portion of the                         duodenum.                        - Erythematous mucosa in the antrum. Biopsied.                        - Normal stomach. Biopsied.                        - Normal gastroesophageal junction and esophagus. Recommendation:        - Return patient to hospital ward for ongoing care.                        - Clear liquid diet today.                        - Perform a colonoscopy tomorrow.                        - Continue present medications. Procedure Code(s):     --- Professional ---                        [phone], Esophagogastroduodenoscopy, flexible,                         transoral; with biopsy, single or multiple Diagnosis Code(s):     --- Professional ---                        K31.89, Other diseases of stomach and duodenum                        K92.1, Melena (includes Hematochezia) CPT copyright 2019 American Medical Association. All rights reserved. The codes documented in this report are preliminary and upon coder review may  be revised  to meet current compliance requirements. Dr. Ulyess Mort Lin Landsman MD, MD 08/12/2020 11:42:50 AM This report has been signed electronically. Number of Addenda: 0 Note Initiated On: 08/12/2020 11:08 AM Estimated Blood Loss:  Estimated blood loss: none.      [facility]

## 2020-08-13 ENCOUNTER — Encounter
Admission: EM | Disposition: A | Discharge status: Home Health | Payer: Self-pay | Source: Home / Self Care | Attending: Internal Medicine

## 2020-08-13 ENCOUNTER — Inpatient Hospital Stay
Admit: 2020-08-13 | Discharge: 2020-08-13 | Disposition: A | Discharge status: Home / Self Care | Payer: Medicare HMO | Attending: Internal Medicine | Admitting: Internal Medicine

## 2020-08-13 LAB — BLOOD GAS, VENOUS
Acid-Base Excess: 9.9 mmol/L — ABNORMAL HIGH (ref 0.0–2.0)
Bicarbonate: 36.7 mmol/L — ABNORMAL HIGH (ref 20.0–28.0)
O2 Saturation: 89.1 %
Patient temperature: 37
pCO2, Ven: 62 mmHg — ABNORMAL HIGH (ref 44.0–60.0)
pH, Ven: 7.38 (ref 7.250–7.430)
pO2, Ven: 58 mmHg — ABNORMAL HIGH (ref 32.0–45.0)

## 2020-08-13 LAB — CBC
HCT: 33.8 % — ABNORMAL LOW (ref 36.0–46.0)
Hemoglobin: 9.5 g/dL — ABNORMAL LOW (ref 12.0–15.0)
MCH: 23.1 pg — ABNORMAL LOW (ref 26.0–34.0)
MCHC: 28.1 g/dL — ABNORMAL LOW (ref 30.0–36.0)
MCV: 82 fL (ref 80.0–100.0)
Platelets: 277 10*3/uL (ref 150–400)
RBC: 4.12 MIL/uL (ref 3.87–5.11)
RDW: 18.6 % — ABNORMAL HIGH (ref 11.5–15.5)
WBC: 5.8 10*3/uL (ref 4.0–10.5)
nRBC: 0 % (ref 0.0–0.2)

## 2020-08-13 LAB — ECHOCARDIOGRAM COMPLETE
AR max vel: 2.24 cm2
AV Area VTI: 2.41 cm2
AV Area mean vel: 2.28 cm2
AV Mean grad: 3.5 mmHg
AV Peak grad: 5.7 mmHg
Ao pk vel: 1.19 m/s
Area-P 1/2: 4.06 cm2
Height: 59 in
S' Lateral: 2.89 cm
Weight: 1795.2 oz

## 2020-08-13 LAB — MAGNESIUM: Magnesium: 2.4 mg/dL (ref 1.7–2.4)

## 2020-08-13 LAB — BASIC METABOLIC PANEL
Anion gap: 8 (ref 5–15)
BUN: 22 mg/dL (ref 8–23)
CO2: 35 mmol/L — ABNORMAL HIGH (ref 22–32)
Calcium: 9.1 mg/dL (ref 8.9–10.3)
Chloride: 96 mmol/L — ABNORMAL LOW (ref 98–111)
Creatinine, Ser: 0.77 mg/dL (ref 0.44–1.00)
GFR calc Af Amer: >60 mL/min (ref >60)
GFR calc non Af Amer: >60 mL/min (ref >60)
Glucose, Bld: 105 mg/dL — ABNORMAL HIGH (ref 70–99)
Potassium: 4.4 mmol/L (ref 3.5–5.1)
Sodium: 139 mmol/L (ref 135–145)

## 2020-08-13 SURGERY — COLONOSCOPY WITH PROPOFOL
Anesthesia: General

## 2020-08-13 MED ORDER — SODIUM CHLORIDE 0.9 % IV SOLN
300.0000 mg | Freq: Once | INTRAVENOUS | Status: AC
  Administered 2020-08-13: 300 mg via INTRAVENOUS
  Filled 2020-08-13: qty 15

## 2020-08-13 MED ORDER — POLYETHYLENE GLYCOL 3350 17 GM/SCOOP PO POWD
1.0000 | Freq: Once | ORAL | Status: AC
  Administered 2020-08-13: 255 g via ORAL
  Filled 2020-08-13: qty 255

## 2020-08-13 MED ORDER — POLYETHYLENE GLYCOL 3350 17 G PO PACK
17.0000 g | PACK | Freq: Once | ORAL | Status: AC
  Administered 2020-08-13: 17 g via ORAL
  Filled 2020-08-13: qty 1

## 2020-08-13 NOTE — Progress Notes (Signed)
Held cardizem, lasix and metoprolol  Due to BP being low and HR at 62.   Patient is asymptomatic- denies dizziness

## 2020-08-13 NOTE — Progress Notes (Signed)
Cephas Darby, MD [address]  [place]  [place], [place] [address]  Main: [phone]  Fax: [phone] Pager: [phone]   Subjective: No acute events overnight.  Patient underwent upper endoscopy yesterday which was unremarkable.  She cannot tolerate GoLYTELY.  Colonoscopy canceled today.  She is on clear liquids, started on MiraLAX and also received tapwater enema.  Her stools are currently liquid brown   Objective: Vital signs in last 24 hours: Vitals:   08/13/20 1230 08/13/20 1245 08/13/20 1300 08/13/20 1400  BP:    107/81  Pulse: 100 (!) 46 93 77  Resp: 17 18 19  (!) 22  Temp:      TempSrc:      SpO2: 91% 97% 90% 96%  Weight:      Height:       Weight change:   Intake/Output Summary (Last 24 hours) at 08/13/2020 1457 Last data filed at 08/13/2020 0950 Gross per 24 hour  Intake 480 ml  Output 600 ml  Net -120 ml     Exam: Heart: Irregularly irregular Lungs: normal and clear to auscultation Abdomen: soft, nontender, normal bowel sounds   Lab Results: CBC Latest Ref Rng & Units 08/13/2020 08/12/2020 08/11/2020  WBC 4.0 - 10.5 K/uL 5.8 5.3 6.9  Hemoglobin 12.0 - 15.0 g/dL 9.5(L) 9.4(L) 11.1(L)  Hematocrit 36 - 46 % 33.8(L) 32.3(L) 36.5  Platelets 150 - 400 K/uL 277 258 308   CMP Latest Ref Rng & Units 08/13/2020 08/12/2020 08/11/2020  Glucose 70 - 99 mg/dL 105(H) 119(H) 121(H)  BUN 8 - 23 mg/dL 22 28(H) 33(H)  Creatinine 0.44 - 1.00 mg/dL 0.77 0.73 0.84  Sodium 135 - 145 mmol/L 139 142 149(H)  Potassium 3.5 - 5.1 mmol/L 4.4 3.5 3.5  Chloride 98 - 111 mmol/L 96(L) 95(L) 95(L)  CO2 22 - 32 mmol/L 35(H) 37(H) 37(H)  Calcium 8.9 - 10.3 mg/dL 9.1 8.6(L) 9.2  Total Protein 6.5 - 8.1 g/dL - - 6.2(L)  Total Bilirubin 0.3 - 1.2 mg/dL - - 0.9  Alkaline Phos 38 - 126 U/L - - 58  AST 15 - 41 U/L - - 23  ALT 0 - 44 U/L - - 14    Micro Results: Recent Results (from the past 240 hour(s))  SARS Coronavirus 2 by RT PCR (hospital order, performed in [facility] hospital lab) Nasopharyngeal Nasopharyngeal Swab     Status: None   Collection Time: 08/04/20 10:53 PM   Specimen: Nasopharyngeal Swab  Result Value Ref Range Status   SARS Coronavirus 2 NEGATIVE NEGATIVE Final    Comment: (NOTE) SARS-CoV-2 target nucleic acids are NOT DETECTED.  The SARS-CoV-2 RNA is generally detectable in upper and lower respiratory specimens during the acute phase of infection. The lowest concentration of SARS-CoV-2 viral copies this assay can detect is 250 copies / mL. A negative result does not preclude SARS-CoV-2 infection and should not be used as the sole basis for treatment or other patient management decisions.  A negative result may occur with improper specimen collection / handling, submission of specimen other than nasopharyngeal swab, presence of viral mutation(s) within the areas targeted by this assay, and inadequate number of viral copies (<250 copies / mL). A negative result must be combined with clinical observations, patient history, and epidemiological information.  Fact Sheet for Patients:   StrictlyIdeas.no  Fact Sheet for Healthcare Providers: BankingDealers.co.za  This test is not yet approved or  cleared by the [place] FDA and has been authorized for  detection and/or diagnosis of SARS-CoV-2 by FDA under an Emergency Use Authorization (EUA).  This EUA will remain in effect (meaning this test can be used) for the duration of the COVID-19 declaration under Section 564(b)(1) of the Act, 21 U.S.C. section 360bbb-3(b)(1), unless the authorization is terminated or revoked sooner.  Performed at [facility], [address] [place], [place] [address]    Studies/Results: ECHOCARDIOGRAM COMPLETE  Result Date: 08/13/2020    ECHOCARDIOGRAM REPORT   Patient Name:   Jackie Turner Date of Exam: 08/13/2020 Medical Rec #:  [id number]             Height:       59.0 in  Accession #:    [id number]            Weight:       112.2 lb Date of Birth:  1936-06-07             BSA:          1.443 m Patient Age:    84 years              BP:           91/61 mmHg Patient Gender: F                     HR:           82 bpm. Exam Location:  [facility] Procedure: 2D Echo, Cardiac Doppler and Color Doppler Indications:     Chf- acute diastolic 563.14  History:         Patient has prior history of Echocardiogram examinations, most                  recent 06/13/2020. Risk Factors:Dyslipidemia.  Sonographer:     Sherrie Sport RDCS (AE) Referring Phys:  [id number] Floyce Stakes GRIFFITH Diagnosing Phys: Serafina Royals MD IMPRESSIONS  1. Left ventricular ejection fraction, by estimation, is 55 to 60%. The left ventricle has normal function. The left ventricle has no regional wall motion abnormalities. Left ventricular diastolic parameters were normal.  2. Right ventricular systolic function is normal. The right ventricular size is normal. There is normal pulmonary artery systolic pressure.  3. Left atrial size was mild to moderately dilated.  4. Right atrial size was mild to moderately dilated.  5. The mitral valve is normal in structure. Moderate mitral valve regurgitation.  6. Tricuspid valve regurgitation is moderate.  7. The aortic valve is normal in structure. Aortic valve regurgitation is trivial. FINDINGS  Left Ventricle: Left ventricular ejection fraction, by estimation, is 55 to 60%. The left ventricle has normal function. The left ventricle has no regional wall motion abnormalities. The left ventricular internal cavity size was normal in size. There is  no left ventricular hypertrophy. Left ventricular diastolic parameters were normal. Right Ventricle: The right ventricular size is normal. No increase in right ventricular wall thickness. Right ventricular systolic function is normal. There is normal pulmonary artery systolic pressure. The tricuspid regurgitant velocity is 2.31 m/s, and  with an assumed right  atrial pressure of 10 mmHg, the estimated right ventricular systolic pressure is 85.8 mmHg. Left Atrium: Left atrial size was mild to moderately dilated. Right Atrium: Right atrial size was mild to moderately dilated. Pericardium: There is no evidence of pericardial effusion. Mitral Valve: The mitral valve is normal in structure. Moderate mitral valve regurgitation. Tricuspid Valve: The tricuspid valve is normal in structure. Tricuspid valve regurgitation is moderate. Aortic Valve: The  aortic valve is normal in structure. Aortic valve regurgitation is trivial. Aortic valve mean gradient measures 3.5 mmHg. Aortic valve peak gradient measures 5.7 mmHg. Aortic valve area, by VTI measures 2.41 cm. Pulmonic Valve: The pulmonic valve was normal in structure. Pulmonic valve regurgitation is not visualized. Aorta: The aortic root and ascending aorta are structurally normal, with no evidence of dilitation. IAS/Shunts: No atrial level shunt detected by color flow Doppler.  LEFT VENTRICLE PLAX 2D LVIDd:         4.09 cm LVIDs:         2.89 cm LV PW:         1.13 cm LV IVS:        1.36 cm LVOT diam:     2.00 cm LV SV:         50 LV SV Index:   35 LVOT Area:     3.14 cm  RIGHT VENTRICLE RV S prime:     9.03 cm/s TAPSE (M-mode): 2.7 cm LEFT ATRIUM           Index       RIGHT ATRIUM           Index LA diam:      4.50 cm 3.12 cm/m  RA Area:     17.40 cm LA Vol (A4C): 58.1 ml 40.27 ml/m RA Volume:   44.90 ml  31.12 ml/m  AORTIC VALVE                   PULMONIC VALVE AV Area (Vmax):    2.24 cm    PV Vmax:        0.49 m/s AV Area (Vmean):   2.28 cm    PV Peak grad:   1.0 mmHg AV Area (VTI):     2.41 cm    RVOT Peak grad: 2 mmHg AV Vmax:           119.00 cm/s AV Vmean:          82.200 cm/s AV VTI:            0.207 m AV Peak Grad:      5.7 mmHg AV Mean Grad:      3.5 mmHg LVOT Vmax:         85.00 cm/s LVOT Vmean:        59.700 cm/s LVOT VTI:          0.159 m LVOT/AV VTI ratio: 0.77  AORTA Ao Root diam: 2.60 cm MITRAL VALVE                TRICUSPID VALVE MV Area (PHT): 4.06 cm    TR Peak grad:   21.3 mmHg MV Decel Time: 187 msec    TR Vmax:        231.00 cm/s MV E velocity: 82.90 cm/s                            SHUNTS                            Systemic VTI:  0.16 m                            Systemic Diam: 2.00 cm Serafina Royals MD Electronically signed by Serafina Royals MD Signature Date/Time: 08/13/2020/2:06:31 PM    Final    Medications: I have  reviewed the patient's current medications. Prior to Admission:  Medications Prior to Admission  Medication Sig Dispense Refill Last Dose   apixaban (ELIQUIS) 2.5 MG TABS tablet Take 1 tablet (2.5 mg total) by mouth 2 (two) times daily. 180 tablet 1 24+ hours at Unknown   calcium carbonate (CALTRATE 600) 1500 (600 Ca) MG TABS tablet Take 1 tablet by mouth 2 (two) times daily with a meal.   0    CARDIZEM CD 120 MG 24 hr capsule Take 1 capsule (120 mg total) by mouth daily. Brand only 90 capsule 3 24+ hours at Unknown   Clobetasol Propionate Emulsion 0.05 % topical foam Apply 1 application topically 2 (two) times daily.  100 g 0    diclofenac Sodium (VOLTAREN) 1 % GEL Apply 4 g topically 4 (four) times daily. 400 g 1 24+ hours at Unknown   dicyclomine (BENTYL) 10 MG capsule Take 1 capsule (10 mg total) by mouth 3 (three) times daily as needed for spasms. 90 capsule 1 Unknown at PRN   doxepin (SINEQUAN) 10 MG capsule Take 3 capsules (30 mg total) by mouth at bedtime. 270 capsule 1 24+ hours at Unknown   feeding supplement, ENSURE ENLIVE, (ENSURE ENLIVE) LIQD Take 237 mLs by mouth 2 (two) times daily between meals. 237 mL 12    fluticasone (FLONASE) 50 MCG/ACT nasal spray Place 1-2 sprays into both nostrils daily.       Fluticasone-Umeclidin-Vilant (TRELEGY ELLIPTA) 100-62.5-25 MCG/INH AEPB Inhale 1 puff into the lungs daily. 60 each 11 24+ hours at Unknown   nystatin (MYCOSTATIN) 100000 UNIT/ML suspension TAKE 5 MLS BY MOUTH 4 TIMES A DAY. (Patient taking differently: Take 5 mLs by  mouth 4 (four) times daily. ) 60 mL 5 Past Week at Unknown time   oxybutynin (DITROPAN XL) 15 MG 24 hr tablet Take 1 tablet (15 mg total) by mouth at bedtime. 90 tablet 1 24+ hours at Unknown   pentosan polysulfate (ELMIRON) 100 MG capsule Take 1 capsule (100 mg total) by mouth in the morning and at bedtime. (Patient taking differently: Take 100 mg by mouth 2 (two) times daily as needed (urinary pain). ) 180 capsule 1 Unknown at PRN   ULTRAM 50 MG tablet Take 1 tablet (50 mg total) by mouth every 6 (six) hours as needed. (Patient taking differently: Take 50 mg by mouth every 6 (six) hours as needed for moderate pain. ) 90 tablet 5 Unknown at PRN   Urea 40 % LOTN Apply 1 application topically daily as needed (dry skin).   0    VENTOLIN HFA 108 (90 Base) MCG/ACT inhaler INHALE TWO PUFFS INTO THE LUNGS EVERY 4 HOURS AS NEEDED FOR WHEEZING OR SHORTNESS OF BREATH (Patient taking differently: Inhale 2 puffs into the lungs every 4 (four) hours as needed for wheezing or shortness of breath. ) 18 g 5 Unknown at PRN   Scheduled:  Chlorhexidine Gluconate Cloth  6 each Topical Daily   diltiazem  240 mg Oral Daily   doxepin  30 mg Oral QHS   fluticasone furoate-vilanterol  1 puff Inhalation Daily   And   umeclidinium bromide  1 puff Inhalation Daily   furosemide  40 mg Oral Daily   ipratropium-albuterol  3 mL Nebulization Once   ipratropium-albuterol  3 mL Nebulization TID   lidocaine  1 patch Transdermal Q24H   mouth rinse  15 mL Mouth Rinse BID   metoprolol tartrate  50 mg Oral BID   oxybutynin  15 mg Oral QHS  pantoprazole  40 mg Oral QAC breakfast   pentosan polysulfate  100 mg Oral BID   polyethylene glycol powder  1 Container Oral Once   vitamin B-12  1,000 mcg Oral Daily   Continuous:  sodium chloride 500 mL (08/10/20 1012)   sodium chloride 10 mL/hr at 08/12/20 1710   iron sucrose     HQR:FXJOIT chloride, acetaminophen **OR** acetaminophen, albuterol, LORazepam, metoprolol tartrate,  morphine injection, ondansetron **OR** ondansetron (ZOFRAN) IV, sodium chloride, traMADol, traZODone Anti-infectives (From admission, onward)    Start     Dose/Rate Route Frequency Ordered Stop   08/07/20 1015  cefTRIAXone (ROCEPHIN) 2 g in sodium chloride 0.9 % 100 mL IVPB  Status:  Discontinued        2 g 200 mL/hr over 30 Minutes Intravenous Every 24 hours 08/07/20 1001 08/10/20 1407   08/07/20 1015  metroNIDAZOLE (FLAGYL) IVPB 500 mg  Status:  Discontinued        500 mg 100 mL/hr over 60 Minutes Intravenous Every 8 hours 08/07/20 1001 08/09/20 1612      Scheduled Meds:  Chlorhexidine Gluconate Cloth  6 each Topical Daily   diltiazem  240 mg Oral Daily   doxepin  30 mg Oral QHS   fluticasone furoate-vilanterol  1 puff Inhalation Daily   And   umeclidinium bromide  1 puff Inhalation Daily   furosemide  40 mg Oral Daily   ipratropium-albuterol  3 mL Nebulization Once   ipratropium-albuterol  3 mL Nebulization TID   lidocaine  1 patch Transdermal Q24H   mouth rinse  15 mL Mouth Rinse BID   metoprolol tartrate  50 mg Oral BID   oxybutynin  15 mg Oral QHS   pantoprazole  40 mg Oral QAC breakfast   pentosan polysulfate  100 mg Oral BID   polyethylene glycol powder  1 Container Oral Once   vitamin B-12  1,000 mcg Oral Daily   Continuous Infusions:  sodium chloride 500 mL (08/10/20 1012)   sodium chloride 10 mL/hr at 08/12/20 1710   iron sucrose     PRN Meds:.sodium chloride, acetaminophen **OR** acetaminophen, albuterol, LORazepam, metoprolol tartrate, morphine injection, ondansetron **OR** ondansetron (ZOFRAN) IV, sodium chloride, traMADol, traZODone   Assessment: Principal Problem:   GI bleeding Active Problems:   IBS (irritable bowel syndrome)   Atrial fibrillation with RVR (HCC)   COPD (chronic obstructive pulmonary disease) (HCC)   Acute respiratory failure with hypoxia and hypercapnia (HCC)   Acute blood loss anemia    Plan: Melena: Currently resolved EGD  unremarkable We will tentatively plan for colonoscopy tomorrow with MiraLAX bowel prep Continue clear liquid diet If colonoscopy is unremarkable, recommend video capsule endoscopy as outpatient  Iron and B12 deficiency anemia: Continue oral B12 daily Recommend IV iron, Venofer ordered Patient needs close follow-up with GI as outpatient   LOS: 9 days   Zophia Marrone 08/13/2020, 2:57 PM

## 2020-08-13 NOTE — Progress Notes (Signed)
[facility] Cardiology [facility] Encounter Note  Patient: Jackie Turner / Admit Date: 08/04/2020 / Date of Encounter: 08/13/2020, 2:52 PM   Subjective: Patient overall feeling relatively better today than yesterday although still somewhat weak and fatigued with some shortness of breath but apparently closer to her baseline at home.  The patient has had good heart rate control with diltiazem metoprolol combination but this a.m. had much lower heart rate and lower blood pressure requiring holding these medications today.  We will follow for needed reinstatement later today but will continue to work on heart rate control between 60 and 90 bpm Review of Systems: Positive for: Shortness of breath Negative for: Vision change, hearing change, syncope, dizziness, nausea, vomiting,diarrhea, bloody stool, stomach pain, cough, congestion, diaphoresis, urinary frequency, urinary pain,skin lesions, skin rashes Others previously listed  Objective: Telemetry: Atrial fibrillation with more slow rate Physical Exam: Blood pressure 107/81, pulse 77, temperature 98.1 F (36.7 C), temperature source Oral, resp. rate (!) 22, height 4\' 11"  (1.499 m), weight 50.9 kg, SpO2 96 %. Body mass index is 22.66 kg/m. General: Well developed, well nourished, in no acute distress. Head: Normocephalic, atraumatic, sclera non-icteric, no xanthomas, nares are without discharge. Neck: No apparent masses Lungs: Normal respirations with 2 wheezes, n some rhonchi, no rales , no crackles   Heart: Irregular rate and rhythm, normal S1 S2, no murmur, no rub, no gallop, PMI is normal size and placement, carotid upstroke normal without bruit, jugular venous pressure normal Abdomen: Soft, non-tender, non-distended with normoactive bowel sounds. No hepatosplenomegaly. Abdominal aorta is normal size without bruit Extremities: Trace edema, no clubbing, no cyanosis, no ulcers,  Peripheral: 2+ radial, 2+ femoral, 2+ dorsal pedal  pulses Neuro: Alert and oriented. Moves all extremities spontaneously. Psych:  Responds to questions appropriately with a normal affect.   Intake/Output Summary (Last 24 hours) at 08/13/2020 1452 Last data filed at 08/13/2020 0950 Gross per 24 hour  Intake 480 ml  Output 600 ml  Net -120 ml    Inpatient Medications:  . Chlorhexidine Gluconate Cloth  6 each Topical Daily  . diltiazem  240 mg Oral Daily  . doxepin  30 mg Oral QHS  . fluticasone furoate-vilanterol  1 puff Inhalation Daily   And  . umeclidinium bromide  1 puff Inhalation Daily  . furosemide  40 mg Oral Daily  . ipratropium-albuterol  3 mL Nebulization Once  . ipratropium-albuterol  3 mL Nebulization TID  . lidocaine  1 patch Transdermal Q24H  . mouth rinse  15 mL Mouth Rinse BID  . metoprolol tartrate  50 mg Oral BID  . oxybutynin  15 mg Oral QHS  . pantoprazole  40 mg Oral QAC breakfast  . pentosan polysulfate  100 mg Oral BID  . polyethylene glycol powder  1 Container Oral Once  . vitamin B-12  1,000 mcg Oral Daily   Infusions:  . sodium chloride 500 mL (08/10/20 1012)  . sodium chloride 10 mL/hr at 08/12/20 1710  . iron sucrose      Labs: Recent Labs    08/11/20 0626 08/11/20 0626 08/12/20 0613 08/13/20 0445  NA 149*   < > 142 139  K 3.5   < > 3.5 4.4  CL 95*   < > 95* 96*  CO2 37*   < > 37* 35*  GLUCOSE 121*   < > 119* 105*  BUN 33*   < > 28* 22  CREATININE 0.84   < > 0.73 0.77  CALCIUM 9.2   < >  8.6* 9.1  MG 2.2  --   --  2.4   < > = values in this interval not displayed.   Recent Labs    08/11/20 0626  AST 23  ALT 14  ALKPHOS 58  BILITOT 0.9  PROT 6.2*  ALBUMIN 3.6   Recent Labs    08/12/20 0613 08/13/20 0445  WBC 5.3 5.8  HGB 9.4* 9.5*  HCT 32.3* 33.8*  MCV 80.8 82.0  PLT 258 277   No results for input(s): CKTOTAL, CKMB, TROPONINI in the last 72 hours. Invalid input(s): POCBNP No results for input(s): HGBA1C in the last 72 hours.   Weights: Filed Weights   08/10/20  0507 08/11/20 0700 08/12/20 0511  Weight: 50 kg 49.8 kg 50.9 kg     Radiology/Studies:  CT HEAD WO CONTRAST  Result Date: 08/08/2020 CLINICAL DATA:  Delirium with altered mental status EXAM: CT HEAD WITHOUT CONTRAST TECHNIQUE: Contiguous axial images were obtained from the base of the skull through the vertex without intravenous contrast. COMPARISON:  None. FINDINGS: Brain: There is age related volume loss. There is no intracranial mass, hemorrhage, extra-axial fluid collection, or midline shift. There is slight small vessel disease in the centra semiovale bilaterally. No acute infarct is evident. Vascular: No hyperdense vessel. There is calcification in each carotid siphon region. Skull: The bony calvarium appears intact. Sinuses/Orbits: There is mucosal thickening in several ethmoid air cells. Other visualized paranasal sinuses are clear. Visualized orbits appear symmetric bilaterally. Other: Mastoid air cells are clear. IMPRESSION: Age related volume loss with mild periventricular small vessel disease. No acute infarct. No mass or hemorrhage. There are foci of arterial vascular calcification. There is mucosal thickening in several ethmoid air cells. Electronically Signed   By: Lowella Grip III M.D.   On: 08/08/2020 13:43   CT ANGIO CHEST PE W OR WO CONTRAST  Result Date: 08/08/2020 CLINICAL DATA:  Chest pain, shortness of breath, pleural effusion, rule out pulmonary embolism EXAM: CT ANGIOGRAPHY CHEST WITH CONTRAST TECHNIQUE: Multidetector CT imaging of the chest was performed using the standard protocol during bolus administration of intravenous contrast. Multiplanar CT image reconstructions and MIPs were obtained to evaluate the vascular anatomy. CONTRAST:  67mL OMNIPAQUE IOHEXOL 350 MG/ML SOLN COMPARISON:  06/12/2020 FINDINGS: Cardiovascular: Satisfactory opacification of the pulmonary arteries to the segmental level. No evidence of pulmonary embolism. Mild cardiomegaly. Trace pericardial  effusion. Aortic atherosclerosis Mediastinum/Nodes: No enlarged mediastinal, hilar, or axillary lymph nodes. Thyroid gland, trachea, and esophagus demonstrate no significant findings. Lungs/Pleura: Moderate bilateral pleural effusions and associated atelectasis or consolidation. Redemonstrated dense scarring of the left lung base. Upper Abdomen: No acute abnormality. Musculoskeletal: No chest wall abnormality. Redemonstrated high-grade wedge deformity of the T10 vertebral body. Review of the MIP images confirms the above findings. IMPRESSION: 1. Negative examination for pulmonary embolism. 2. Moderate bilateral pleural effusions and associated atelectasis or consolidation. 3. Aortic Atherosclerosis (ICD10-I70.0). Electronically Signed   By: Eddie Candle M.D.   On: 08/08/2020 14:13   DG Chest Port 1 View  Result Date: 08/10/2020 CLINICAL DATA:  Hypoxia EXAM: PORTABLE CHEST 1 VIEW COMPARISON:  Chest radiograph August 07, 2020 and chest CT August 08, 2020 FINDINGS: There are persistent pleural effusions bilaterally with atelectasis and ill-defined airspace opacity in the lung bases. There is also atelectatic change in the left mid lung region. There is mild interstitial edema. There is cardiomegaly with pulmonary venous hypertension. No adenopathy. There is aortic atherosclerosis. Monitor device over the left hemithorax anteriorly. T10 vertebral body fracture better  seen on recent CT. IMPRESSION: Cardiomegaly with a degree of pulmonary vascular congestion. Pleural effusions with a degree of interstitial edema. Suspect a degree of underlying congestive heart failure. There is atelectatic change with patchy airspace opacity in the lung bases. There may be a degree of alveolar edema or possible pneumonia in the lung bases. Appearance similar to recent studies. Aortic Atherosclerosis (ICD10-I70.0). Electronically Signed   By: Lowella Grip III M.D.   On: 08/10/2020 08:13   DG Chest Port 1 View  Result Date:  08/07/2020 CLINICAL DATA:  Increased shortness of breath EXAM: PORTABLE CHEST 1 VIEW COMPARISON:  08/06/2020 FINDINGS: Interval increase in diffuse bilateral interstitial pulmonary opacity. Unchanged elevation of the left hemidiaphragm. Probable small layering bilateral pleural effusions. Mild cardiomegaly. IMPRESSION: Interval increase in diffuse bilateral interstitial pulmonary opacity, which may reflect edema and/or infection. Probable small layering bilateral pleural effusions. Electronically Signed   By: Eddie Candle M.D.   On: 08/07/2020 10:15   DG Chest Port 1 View  Result Date: 08/06/2020 CLINICAL DATA:  Wheezing, anemia EXAM: PORTABLE CHEST 1 VIEW COMPARISON:  06/12/2020 chest radiograph. FINDINGS: Chronic prominent elevation of the left hemidiaphragm. Stable cardiomediastinal silhouette with mild cardiomegaly. No pneumothorax. Small bilateral pleural effusions are increased bilaterally. Patchy consolidation at both lung bases, worsened. IMPRESSION: 1. Chronic prominent left hemidiaphragm elevation. Worsened patchy consolidation at both lung bases, which could represent any combination of atelectasis, aspiration or pneumonia. 2. Mild cardiomegaly. Small bilateral pleural effusions are increased bilaterally. Electronically Signed   By: Ilona Sorrel M.D.   On: 08/06/2020 11:55   ECHOCARDIOGRAM COMPLETE  Result Date: 08/13/2020    ECHOCARDIOGRAM REPORT   Patient Name:   Jackie Turner Date of Exam: 08/13/2020 Medical Rec #:  [id number]             Height:       59.0 in Accession #:    [id number]            Weight:       112.2 lb Date of Birth:  20-May-1936             BSA:          1.443 m Patient Age:    12 years              BP:           91/61 mmHg Patient Gender: F                     HR:           82 bpm. Exam Location:  [facility] Procedure: 2D Echo, Cardiac Doppler and Color Doppler Indications:     Chf- acute diastolic 242.35  History:         Patient has prior history of Echocardiogram  examinations, most                  recent 06/13/2020. Risk Factors:Dyslipidemia.  Sonographer:     Sherrie Sport RDCS (AE) Referring Phys:  [id number] Floyce Stakes GRIFFITH Diagnosing Phys: Serafina Royals MD IMPRESSIONS  1. Left ventricular ejection fraction, by estimation, is 55 to 60%. The left ventricle has normal function. The left ventricle has no regional wall motion abnormalities. Left ventricular diastolic parameters were normal.  2. Right ventricular systolic function is normal. The right ventricular size is normal. There is normal pulmonary artery systolic pressure.  3. Left atrial size was mild to moderately dilated.  4. Right atrial size was mild to  moderately dilated.  5. The mitral valve is normal in structure. Moderate mitral valve regurgitation.  6. Tricuspid valve regurgitation is moderate.  7. The aortic valve is normal in structure. Aortic valve regurgitation is trivial. FINDINGS  Left Ventricle: Left ventricular ejection fraction, by estimation, is 55 to 60%. The left ventricle has normal function. The left ventricle has no regional wall motion abnormalities. The left ventricular internal cavity size was normal in size. There is  no left ventricular hypertrophy. Left ventricular diastolic parameters were normal. Right Ventricle: The right ventricular size is normal. No increase in right ventricular wall thickness. Right ventricular systolic function is normal. There is normal pulmonary artery systolic pressure. The tricuspid regurgitant velocity is 2.31 m/s, and  with an assumed right atrial pressure of 10 mmHg, the estimated right ventricular systolic pressure is 43.3 mmHg. Left Atrium: Left atrial size was mild to moderately dilated. Right Atrium: Right atrial size was mild to moderately dilated. Pericardium: There is no evidence of pericardial effusion. Mitral Valve: The mitral valve is normal in structure. Moderate mitral valve regurgitation. Tricuspid Valve: The tricuspid valve is normal in structure.  Tricuspid valve regurgitation is moderate. Aortic Valve: The aortic valve is normal in structure. Aortic valve regurgitation is trivial. Aortic valve mean gradient measures 3.5 mmHg. Aortic valve peak gradient measures 5.7 mmHg. Aortic valve area, by VTI measures 2.41 cm. Pulmonic Valve: The pulmonic valve was normal in structure. Pulmonic valve regurgitation is not visualized. Aorta: The aortic root and ascending aorta are structurally normal, with no evidence of dilitation. IAS/Shunts: No atrial level shunt detected by color flow Doppler.  LEFT VENTRICLE PLAX 2D LVIDd:         4.09 cm LVIDs:         2.89 cm LV PW:         1.13 cm LV IVS:        1.36 cm LVOT diam:     2.00 cm LV SV:         50 LV SV Index:   35 LVOT Area:     3.14 cm  RIGHT VENTRICLE RV S prime:     9.03 cm/s TAPSE (M-mode): 2.7 cm LEFT ATRIUM           Index       RIGHT ATRIUM           Index LA diam:      4.50 cm 3.12 cm/m  RA Area:     17.40 cm LA Vol (A4C): 58.1 ml 40.27 ml/m RA Volume:   44.90 ml  31.12 ml/m  AORTIC VALVE                   PULMONIC VALVE AV Area (Vmax):    2.24 cm    PV Vmax:        0.49 m/s AV Area (Vmean):   2.28 cm    PV Peak grad:   1.0 mmHg AV Area (VTI):     2.41 cm    RVOT Peak grad: 2 mmHg AV Vmax:           119.00 cm/s AV Vmean:          82.200 cm/s AV VTI:            0.207 m AV Peak Grad:      5.7 mmHg AV Mean Grad:      3.5 mmHg LVOT Vmax:         85.00 cm/s LVOT Vmean:  59.700 cm/s LVOT VTI:          0.159 m LVOT/AV VTI ratio: 0.77  AORTA Ao Root diam: 2.60 cm MITRAL VALVE               TRICUSPID VALVE MV Area (PHT): 4.06 cm    TR Peak grad:   21.3 mmHg MV Decel Time: 187 msec    TR Vmax:        231.00 cm/s MV E velocity: 82.90 cm/s                            SHUNTS                            Systemic VTI:  0.16 m                            Systemic Diam: 2.00 cm Serafina Royals MD Electronically signed by Serafina Royals MD Signature Date/Time: 08/13/2020/2:06:31 PM    Final      Assessment and  Recommendation  84 y.o. female with hypertension hyperlipidemia and apparent GI bleed with anemia and possible respiratory infection having acute onset paroxysmal nonvalvular atrial fibrillation with rapid ventricular rate exacerbated by above now much slower and more controlled not needing medications this morning but continuing to be without evidence of current congestive heart failure or myocardial infarction currently at lowest risk possible for endoscopy for further evaluation of bleeding complications 1.  Abstain from beta-blocker calcium channel blocker at this time until further evaluation later this afternoon for reinstatement of dosages for heart rate of between 60 and 90 bpm 2.  Abstain from anticoagulation at this time due to concerns of bleeding complications upper GI bleed and melena 3.  Further evaluation from gastroenterology including upper endoscopy colonoscopy as necessary without restriction at this time due to lowest risk possible 4.  Continue supportive care of pulmonary infection 5.  Further treatment options after about Signed, Serafina Royals M.D. FACC

## 2020-08-13 NOTE — Progress Notes (Signed)
Gave patient a Tap water enema (about 1269ml)   Patient tolerated well.   Patient able to hold for a minute or two, before having to sit on BSC.

## 2020-08-13 NOTE — Progress Notes (Signed)
*  PRELIMINARY RESULTS* Echocardiogram 2D Echocardiogram has been performed.  Jackie Turner 08/13/2020, 9:00 AM

## 2020-08-13 NOTE — Progress Notes (Signed)
PROGRESS NOTE    Harris Kistler   OVF:[id number]  DOB: [birth year]  PCP: Mar Daring, PA-C    DOA: 08/04/2020 LOS: 9   Brief Narrative   Jackie Turner  is a 84 y.o. female with a history of asthma, dyslipidemia, atrial fibrillation on Eliquis and osteoporosis, who presented to the ED on  08/04/20 with recurrent dark stools, having recently been started on Eliquis for A-fib in early July.    In the ED, temperature was 100.2, RR 25, BP 145/87 with HR 79, and O2 sat  90's% on 4 L nasal cannula oxygen.  CMP was unremarkable aside from glucose of 172.  CBC showed Hbg 6.3, Hct 21.5 (down from 11.4 and 35.8 on 06/22/2020). EKG showed A-fib with RVR at 104 bpm.  Treated with IV Protonix in the ED and admitted to hospitalist service with GI consulted for evaluation of GI bleeding.   From GI standpoint, endoscopic evaluation was delayed by patient's respiratory issues.  Her bleeding stopped once Eliquis was held, has not recurred since admission.  Hemoglobin relative stable.  No bleeding source was found on EGD.  Colonoscopy planned for 8/30 (she was unable to drink GoLytely, so alternate prep being given and procedure tmrw).   From cardiac and respiratory standpoint, she clinically improved with IV diuresis and has been transitioned to oral Lasix.  She was treated for possible pneumonia with 4 days of IV antibiotics.  Imaging and history reviewed, since patient remained afebrile without respiratory complaints after diuresed, antibiotics were stopped.  Clinically remains stable and improved, on her baseline home O2.  Patient did require Bipap due to hypercapnia with pCO2 in 70's and concurrent confusion and agitation.  This has also resolved.      Assessment & Plan   Principal Problem:   GI bleeding Active Problems:   Atrial fibrillation with RVR (HCC)   Acute respiratory failure with hypoxia and hypercapnia (HCC)   Acute blood loss anemia   COPD (chronic obstructive pulmonary  disease) (HCC)   IBS (irritable bowel syndrome)   Acute respiratory failure with hypoxia and hypercapnia - secondary to COPD, CHF and possibly PNA given consolidation on imaging.  Bipap PRN.  Supplemental O2 to keep O2 sat > 90%.  Further plans as below.  Acute on Chronic Diastolic CHF - last echo June 2021 showed EF 60-65% with grade 1 diastolic dysfunction.  Monitor I/O's and daily weights.  Cardiology following, recommends low dose PO Lasix daily.   Repeat echo is pending.  Atrial fibrillation with RVR - most likely due to anemia.  CHA2DS2-VASc score is 4.   Patient's heart rates during encounter this morning 90s to 120s.   Per RN, rate increases significantly on exertion and patient gets short of breath.. Eliquis held due to GI bleed concerns.   Cardiology consulted.   Continue p.o. Cardizem 60 mg q6h. beta-blocker was increased IV Lopressor PRN for rate control.   Maintain K>4.0 and Mg >2.0.  Hypernatremia -not present on admission, likely due to diuresis and poor intake of water.  Resolved.  New on 8/26 Na 146 >> 149 >> 142 >> 139 today.  Encourage patient to drink water.  Will avoid D5W infusion given need for diuresis.  BMP in AM.  Anion gap metabolic acidosis -not present on admission.  Likely due to diuresis.  Monitor.  Community-Acquired Pneumonia - .  Had been started on Rocephin and Flagyl.  Stopped Flagyl as anaerobic coverage no longer recommended for aspiration.  Received 4 days  antibiotics, afebrile, no leukocytosis, and procal < 0.10, so stopped Rocephin 8.26.  Monitor clinically.    GI bleeding with Acute Blood Loss Anemia - POA. Anemia improving and no further bleeding seen.  Has history of PUD with bleeding many years ago.  Hold Eliquis.  Serial H&H's.  Transfuse in Hbg < 7.0.  (Has received 2 units RBC's so far).  Continue IV Protonix BID for now.   [facility] if any signs of active bleeding.  EGD on 8/28 was fairly unremarkable and no source of bleeding was seen.   Plan  is for colonoscopy tomorrow.  Start bowel prep.  Clear liquid diet today.  Hypokalemia - resolved.  K 3.1 on 8/25, replaced.  Monitor and replace as needed.   COPD - not acutely exacerbated.  Duonebs scheduled and PRN.  Bipap nightly.  IBS with constipation - chronic, patient says has not had any constipation since starting Eliquis in July.  Monitor.    DVT prophylaxis: SCDs Start: 08/04/20 2314   Diet:  Diet Orders (From admission, onward)    Start     Ordered   08/13/20 0639  Diet clear liquid Room service appropriate? Yes; Fluid consistency: Thin  Diet effective 0500       Question Answer Comment  Room service appropriate? Yes   Fluid consistency: Thin      08/13/20 0640            Code Status: Full Code    Subjective 08/13/20    Patient seen up in chair this AM.  She reports she feels well.  She was unable to tolerate GoLytely, so colonoscopy today is cancelled.  She is hopeful to go home soon.  No acute events reported.   Disposition Plan & Communication   Status is: Inpatient  Remains inpatient appropriate because:Inpatient level of care appropriate due to severity of illness.  Colonoscopy planned for tomorrow.  Dispo: The patient is from: Home              Anticipated d/c is to: Home with home health              Anticipated d/c date is: 1-2 days              Patient currently is not medically stable to d/c.  Family Communication: granddaughter [name] updated by phone on afternoon 8/28   Consults, Procedures, Significant Events   Consultants:   Gastroenterology  Cardiology  Procedures:   None  Antimicrobials:   Rocephin 8/23 >> 8/26   Objective   Vitals:   08/13/20 1215 08/13/20 1230 08/13/20 1245 08/13/20 1300  BP: 90/60     Pulse: (!) 33 100 (!) 46 93  Resp: (!) 28 17 18 19   Temp:      TempSrc:      SpO2: 98% 91% 97% 90%  Weight:      Height:        Intake/Output Summary (Last 24 hours) at 08/13/2020 1333 Last data filed at  08/13/2020 0950 Gross per 24 hour  Intake 480 ml  Output 600 ml  Net -120 ml   Filed Weights   08/10/20 0507 08/11/20 0700 08/12/20 0511  Weight: 50 kg 49.8 kg 50.9 kg    Physical Exam:  General exam: awake, alert, no acute distress Respiratory system: CTAB with diminished bases, normal respiratory effort at rest, on 2 L/min oxygen. Cardiovascular system: normal S1/S2, irregular rhythm, regular rate, no pedal edema.   Central nervous system: A&O x3. no  gross focal neurologic deficits, normal speech Extremities: moves all, no cyanosis, normal tone, no edema   Labs   Data Reviewed: I have personally reviewed following labs and imaging studies  CBC: Recent Labs  Lab 08/08/20 0449 08/08/20 0449 08/09/20 0816 08/10/20 0522 08/11/20 0626 08/12/20 0613 08/13/20 0445  WBC 7.3   < > 7.8 7.4 6.9 5.3 5.8  NEUTROABS 5.2  --   --   --   --   --   --   HGB 8.9*   < > 10.3* 10.6* 11.1* 9.4* 9.5*  HCT 30.4*   < > 33.2* 34.9* 36.5 32.3* 33.8*  MCV 81.1   < > 76.9* 78.3* 78.2* 80.8 82.0  PLT 265   < > 307 331 308 258 277   < > = values in this interval not displayed.   Basic Metabolic Panel: Recent Labs  Lab 08/07/20 0543 08/07/20 0543 08/07/20 1522 08/07/20 1522 08/08/20 0449 08/08/20 0449 08/09/20 0816 08/10/20 0522 08/11/20 0626 08/12/20 0613 08/13/20 0445  NA 144   < > 144   < > 146*   < > 144 146* 149* 142 139  K 4.0   < > 3.7   < > 3.3*   < > 3.1* 4.1 3.5 3.5 4.4  CL 102   < > 99   < > 99   < > 90* 96* 95* 95* 96*  CO2 31   < > 33*   < > 36*   < > 38* 33* 37* 37* 35*  GLUCOSE 124*   < > 144*   < > 99   < > 102* 108* 121* 119* 105*  BUN 12   < > 14   < > 16   < > 19 28* 33* 28* 22  CREATININE 0.67   < > 0.59   < > 0.64   < > 0.83 0.79 0.84 0.73 0.77  CALCIUM 8.8*   < > 8.2*   < > 8.3*   < > 8.8* 9.1 9.2 8.6* 9.1  MG 2.0   < > 1.7  --  2.2  --  1.8  --  2.2  --  2.4  PHOS 2.7  --   --   --  2.3*  --   --   --   --   --   --    < > = values in this interval not  displayed.   GFR: Estimated Creatinine Clearance: 37 mL/min (by C-G formula based on SCr of 0.77 mg/dL). Liver Function Tests: Recent Labs  Lab 08/08/20 0449 08/09/20 0816 08/10/20 0522 08/11/20 0626  AST 15 18 18 23   ALT 11 14 13 14   ALKPHOS 60 62 59 58  BILITOT 0.7 1.8* 1.5* 0.9  PROT 5.8* 5.8* 6.3* 6.2*  ALBUMIN 3.3* 3.4* 3.6 3.6   No results for input(s): LIPASE, AMYLASE in the last 168 hours. No results for input(s): AMMONIA in the last 168 hours. Coagulation Profile: No results for input(s): INR, PROTIME in the last 168 hours. Cardiac Enzymes: No results for input(s): CKTOTAL, CKMB, CKMBINDEX, TROPONINI in the last 168 hours. BNP (last 3 results) No results for input(s): PROBNP in the last 8760 hours. HbA1C: No results for input(s): HGBA1C in the last 72 hours. CBG: Recent Labs  Lab 08/06/20 1657 08/06/20 2153  GLUCAP 110* 82   Lipid Profile: No results for input(s): CHOL, HDL, LDLCALC, TRIG, CHOLHDL, LDLDIRECT in the last 72 hours. Thyroid Function Tests: No results for input(s):  TSH, T4TOTAL, FREET4, T3FREE, THYROIDAB in the last 72 hours. Anemia Panel: Recent Labs    08/12/20 0613  FERRITIN 19  TIBC 487*  IRON 17*   Sepsis Labs: Recent Labs  Lab 08/10/20 0522 08/11/20 0626  PROCALCITON <0.10 <0.10    Recent Results (from the past 240 hour(s))  SARS Coronavirus 2 by RT PCR (hospital order, performed in [facility] hospital lab) Nasopharyngeal Nasopharyngeal Swab     Status: None   Collection Time: 08/04/20 10:53 PM   Specimen: Nasopharyngeal Swab  Result Value Ref Range Status   SARS Coronavirus 2 NEGATIVE NEGATIVE Final    Comment: (NOTE) SARS-CoV-2 target nucleic acids are NOT DETECTED.  The SARS-CoV-2 RNA is generally detectable in upper and lower respiratory specimens during the acute phase of infection. The lowest concentration of SARS-CoV-2 viral copies this assay can detect is 250 copies / mL. A negative result does not preclude  SARS-CoV-2 infection and should not be used as the sole basis for treatment or other patient management decisions.  A negative result may occur with improper specimen collection / handling, submission of specimen other than nasopharyngeal swab, presence of viral mutation(s) within the areas targeted by this assay, and inadequate number of viral copies (<250 copies / mL). A negative result must be combined with clinical observations, patient history, and epidemiological information.  Fact Sheet for Patients:   StrictlyIdeas.no  Fact Sheet for Healthcare Providers: BankingDealers.co.za  This test is not yet approved or  cleared by the [place] FDA and has been authorized for detection and/or diagnosis of SARS-CoV-2 by FDA under an Emergency Use Authorization (EUA).  This EUA will remain in effect (meaning this test can be used) for the duration of the COVID-19 declaration under Section 564(b)(1) of the Act, 21 U.S.C. section 360bbb-3(b)(1), unless the authorization is terminated or revoked sooner.  Performed at [facility], [address], [place], [place] [address]       Imaging Studies   No results found.   Medications   Scheduled Meds:  Chlorhexidine Gluconate Cloth  6 each Topical Daily   diltiazem  240 mg Oral Daily   doxepin  30 mg Oral QHS   fluticasone furoate-vilanterol  1 puff Inhalation Daily   And   umeclidinium bromide  1 puff Inhalation Daily   furosemide  40 mg Oral Daily   ipratropium-albuterol  3 mL Nebulization Once   ipratropium-albuterol  3 mL Nebulization TID   lidocaine  1 patch Transdermal Q24H   mouth rinse  15 mL Mouth Rinse BID   metoprolol tartrate  50 mg Oral BID   oxybutynin  15 mg Oral QHS   pantoprazole  40 mg Oral QAC breakfast   pentosan polysulfate  100 mg Oral BID   vitamin B-12  1,000 mcg Oral Daily   Continuous Infusions:  sodium chloride 500 mL  (08/10/20 1012)   sodium chloride 10 mL/hr at 08/12/20 1710       LOS: 9 days    Time spent: 25 minutes with greater than 50% spent in coordination of care and direct patient contact.    Ezekiel Slocumb, DO [facility]  08/13/2020, 1:33 PM    If 7PM-7AM, please contact night-coverage. How to contact the [facility] Attending or Consulting provider [place] or covering provider during after hours [place], for this patient?    1. Check the care team in [facility] and look for a) attending/consulting [facility] provider listed and b) the [facility] team listed 2.  Log into [email or web address] and use Page's universal password to access. If you do not have the password, please contact the hospital operator. 3. Locate the [facility] provider you are looking for under [facility] and page to a number that you can be directly reached. 4. If you still have difficulty reaching the provider, please page the [facility] (Director on Call) for the Hospitalists listed on amion for assistance.

## 2020-08-13 NOTE — Progress Notes (Signed)
Notified GI MD that pt was unable to do the prep for the colonoscopy. The pt began to drink one cup and stated that she could not do it.  New orders placed.

## 2020-08-13 NOTE — Progress Notes (Signed)
*  PRELIMINARY RESULTS* Echocardiogram 2D Echocardiogram has been performed.  Jackie Turner 08/13/2020, 8:59 AM

## 2020-08-14 ENCOUNTER — Inpatient Hospital Stay: Payer: Medicare HMO | Admitting: Certified Registered Nurse Anesthetist

## 2020-08-14 ENCOUNTER — Encounter
Admission: EM | Disposition: A | Discharge status: Home Health | Payer: Self-pay | Source: Home / Self Care | Attending: Internal Medicine

## 2020-08-14 ENCOUNTER — Encounter: Payer: Self-pay | Admitting: Gastroenterology

## 2020-08-14 DIAGNOSIS — K635 Polyp of colon: Secondary | ICD-10-CM

## 2020-08-14 HISTORY — PX: COLONOSCOPY WITH PROPOFOL: SHX5780

## 2020-08-14 LAB — CBC
HCT: 33.9 % — ABNORMAL LOW (ref 36.0–46.0)
Hemoglobin: 9.7 g/dL — ABNORMAL LOW (ref 12.0–15.0)
MCH: 23.3 pg — ABNORMAL LOW (ref 26.0–34.0)
MCHC: 28.6 g/dL — ABNORMAL LOW (ref 30.0–36.0)
MCV: 81.5 fL (ref 80.0–100.0)
Platelets: 274 10*3/uL (ref 150–400)
RBC: 4.16 MIL/uL (ref 3.87–5.11)
RDW: 18.6 % — ABNORMAL HIGH (ref 11.5–15.5)
WBC: 5.4 10*3/uL (ref 4.0–10.5)
nRBC: 0 % (ref 0.0–0.2)

## 2020-08-14 LAB — MAGNESIUM: Magnesium: 2.1 mg/dL (ref 1.7–2.4)

## 2020-08-14 LAB — BASIC METABOLIC PANEL
Anion gap: 9 (ref 5–15)
BUN: 12 mg/dL (ref 8–23)
CO2: 32 mmol/L (ref 22–32)
Calcium: 8.9 mg/dL (ref 8.9–10.3)
Chloride: 99 mmol/L (ref 98–111)
Creatinine, Ser: 0.7 mg/dL (ref 0.44–1.00)
GFR calc Af Amer: >60 mL/min (ref >60)
GFR calc non Af Amer: >60 mL/min (ref >60)
Glucose, Bld: 90 mg/dL (ref 70–99)
Potassium: 3.7 mmol/L (ref 3.5–5.1)
Sodium: 140 mmol/L (ref 135–145)

## 2020-08-14 LAB — GLUCOSE, CAPILLARY: Glucose-Capillary: 99 mg/dL (ref 70–99)

## 2020-08-14 SURGERY — COLONOSCOPY WITH PROPOFOL
Anesthesia: General

## 2020-08-14 MED ORDER — METOPROLOL TARTRATE 25 MG PO TABS
25.0000 mg | ORAL_TABLET | Freq: Two times a day (BID) | ORAL | Status: DC
  Administered 2020-08-14 – 2020-08-15 (×3): 25 mg via ORAL
  Filled 2020-08-14 (×3): qty 1

## 2020-08-14 MED ORDER — POTASSIUM CHLORIDE CRYS ER 20 MEQ PO TBCR
40.0000 meq | EXTENDED_RELEASE_TABLET | Freq: Once | ORAL | Status: AC
  Administered 2020-08-14: 40 meq via ORAL
  Filled 2020-08-14: qty 2

## 2020-08-14 MED ORDER — PHENYLEPHRINE HCL (PRESSORS) 10 MG/ML IV SOLN
INTRAVENOUS | Status: DC | PRN
  Administered 2020-08-14: 100 ug via INTRAVENOUS

## 2020-08-14 MED ORDER — PROPOFOL 10 MG/ML IV BOLUS
INTRAVENOUS | Status: DC | PRN
  Administered 2020-08-14 (×3): 10 mg via INTRAVENOUS
  Administered 2020-08-14: 40 mg via INTRAVENOUS

## 2020-08-14 MED ORDER — PROPOFOL 500 MG/50ML IV EMUL
INTRAVENOUS | Status: DC | PRN
  Administered 2020-08-14: 100 ug/kg/min via INTRAVENOUS

## 2020-08-14 MED ORDER — LACTATED RINGERS IV BOLUS
250.0000 mL | Freq: Once | INTRAVENOUS | Status: AC
  Administered 2020-08-14: 250 mL via INTRAVENOUS

## 2020-08-14 NOTE — Progress Notes (Addendum)
PROGRESS NOTE    Jackie Turner   MWN:[id number]  DOB: [birth year]  PCP: Mar Daring, PA-C    DOA: 08/04/2020 LOS: 10   Brief Narrative   Jackie Turner  is a 84 y.o. female with a history of asthma, dyslipidemia, atrial fibrillation on Eliquis and osteoporosis, who presented to the ED on  08/04/20 with recurrent dark stools, having recently been started on Eliquis for A-fib in early July.    In the ED, temperature was 100.2, RR 25, BP 145/87 with HR 79, and O2 sat  90's% on 4 L nasal cannula oxygen.  CMP was unremarkable aside from glucose of 172.  CBC showed Hbg 6.3, Hct 21.5 (down from 11.4 and 35.8 on 06/22/2020). EKG showed A-fib with RVR at 104 bpm.  Treated with IV Protonix in the ED and admitted to hospitalist service with GI consulted for evaluation of GI bleeding.   From GI standpoint, endoscopic evaluation was delayed by patient's respiratory issues.  Her bleeding stopped once Eliquis was held, has not recurred since admission.  Hemoglobin relative stable.  No bleeding source was found on EGD.  Colonoscopy planned for 8/30 (she was unable to drink GoLytely, so alternate prep being given and procedure tmrw).   From cardiac and respiratory standpoint, she clinically improved with IV diuresis and has been transitioned to oral Lasix.  She was treated for possible pneumonia with 4 days of IV antibiotics.  Imaging and history reviewed, since patient remained afebrile without respiratory complaints after diuresed, antibiotics were stopped.  Clinically remains stable and improved, on her baseline home O2.  Patient did require Bipap due to hypercapnia with pCO2 in 70's and concurrent confusion and agitation.  This has also resolved.      Assessment & Plan   Principal Problem:   GI bleeding Active Problems:   Atrial fibrillation with RVR (HCC)   Acute respiratory failure with hypoxia and hypercapnia (HCC)   Acute blood loss anemia   COPD (chronic obstructive pulmonary  disease) (HCC)   IBS (irritable bowel syndrome)   Polyp of colon   Acute on chronic respiratory failure with hypoxia, hypercapnia cannot be specified - secondary to COPD, CHF and possibly PNA given consolidation on imaging.  Bipap PRN.  Supplemental O2 to keep O2 sat > 90%.  Further plans as below. Has been on home O2 since admission in July, 2 L/min  Acute on Chronic Diastolic CHF - last echo June 2021 showed EF 60-65% with grade 1 diastolic dysfunction.  Monitor I/O's and daily weights.  Cardiology following, recommends low dose PO Lasix daily.   Repeat echo is pending.  Atrial fibrillation with RVR - most likely due to anemia.  CHA2DS2-VASc score is 4.   Patient's heart rates during encounter this morning 90s to 120s.   Per RN, rate increases significantly on exertion and patient gets short of breath.. Eliquis held due to GI bleed concerns.   Cardiology consulted.   Continue p.o. Cardizem 60 mg q6h. beta-blocker was increased IV Lopressor PRN for rate control.   Maintain K>4.0 and Mg >2.0.  Hypernatremia -not present on admission, likely due to diuresis and poor intake of water.  Resolved.  New on 8/26 Na 146 >> 149 >> 142 >> 139 today.  Encourage patient to drink water.  Will avoid D5W infusion given need for diuresis.  BMP in AM.  Anion gap metabolic acidosis -not present on admission.  Likely due to diuresis.  Monitor.  Community-Acquired Pneumonia RULED OUT- .  Had  been started on Rocephin and Flagyl empirically due to possible consolidation on CTA chest which was more likely atelectasis due to pleural effusions.  Respiratory status improved with diuresis, unlikely was infection. Received 4 days antibiotics, remained afebrile, no leukocytosis, and procal < 0.10, so abx were d/c'd.  Remains clinically improved and stable.    GI bleeding with Acute Blood Loss Anemia - POA. Anemia improving and no further bleeding seen.  Has history of PUD with bleeding many years ago.  Hold Eliquis.   Serial H&H's.  Transfuse in Hbg < 7.0.  (Has received 2 units RBC's so far).  Continue IV Protonix BID for now.   Page GI if any signs of active bleeding.  EGD on 8/28 was fairly unremarkable and no source of bleeding was seen.   Colonoscopy 8/30 showed a 15 mm sessile polyp at the ileocecal valve, biopsied (unable to be excised due to size and location).  Follow up pathology.   Hypokalemia - resolved.  K 3.1 on 8/25, replaced.  Monitor and replace as needed.   COPD - not acutely exacerbated.  Duonebs scheduled and PRN.  Bipap nightly.  IBS with constipation - chronic, patient says has not had any constipation since starting Eliquis in July.  Monitor.    DVT prophylaxis: SCDs Start: 08/04/20 2314   Diet:  Diet Orders (From admission, onward)     Start     Ordered   08/13/20 0639  Diet clear liquid Room service appropriate? Yes; Fluid consistency: Thin  Diet effective 0500       Question Answer Comment  Room service appropriate? Yes   Fluid consistency: Thin      08/13/20 0640              Code Status: Full Code    Subjective 08/14/20    Patient seen up in chair this AM.  She reports she feels well.  Bowel prep tolerated.  She is looking forward to getting home, tired of the room and watching tv.  No acute complaints.   Disposition Plan & Communication   Status is: Inpatient  Remains inpatient appropriate because: monitoring of BP and HR with change to medications by cardiology today.  Likely d/c tomorrow.  Dispo: The patient is from: Home              Anticipated d/c is to: Home with home health              Anticipated d/c date is: 1 day              Patient currently is not medically stable to d/c.  Family Communication: attempted to granddaughter Jackie Turner today unsuccessfully. I updated her by phone on afternoon 8/28.   Consults, Procedures, Significant Events   Consultants:  Gastroenterology Cardiology  Procedures:  None  Antimicrobials:  Rocephin 8/23  >> 8/26   Objective   Vitals:   08/14/20 1352 08/14/20 1402 08/14/20 1412 08/14/20 1559  BP: 126/88 120/79  104/75  Pulse: 96 95 97 96  Resp: (!) 26 (!) 30 (!) 23 17  Temp:    98.2 F (36.8 C)  TempSrc:    Oral  SpO2: 99% 100% 98% 97%  Weight:      Height:        Intake/Output Summary (Last 24 hours) at 08/14/2020 1627 Last data filed at 08/14/2020 1437 Gross per 24 hour  Intake 1099.34 ml  Output 352 ml  Net 747.34 ml   [employer]   08/10/20  0507 08/11/20 0700 08/12/20 0511  Weight: 50 kg 49.8 kg 50.9 kg    Physical Exam:  General exam: awake, alert, no acute distress Respiratory system: CTAB with diminished bases, normal respiratory effort at rest, on 2 L/min oxygen. Cardiovascular system: normal S1/S2, irregular rhythm, regular rate, no pedal edema.   Central nervous system: A&O x3. no gross focal neurologic deficits, normal speech Extremities: moves all, no cyanosis, normal tone, no edema   Labs   Data Reviewed: I have personally reviewed following labs and imaging studies  CBC: Recent Labs  Lab 08/08/20 0449 08/09/20 0816 08/10/20 0522 08/11/20 0626 08/12/20 0613 08/13/20 0445 08/14/20 0518  WBC 7.3   < > 7.4 6.9 5.3 5.8 5.4  NEUTROABS 5.2  --   --   --   --   --   --   HGB 8.9*   < > 10.6* 11.1* 9.4* 9.5* 9.7*  HCT 30.4*   < > 34.9* 36.5 32.3* 33.8* 33.9*  MCV 81.1   < > 78.3* 78.2* 80.8 82.0 81.5  PLT 265   < > 331 308 258 277 274   < > = values in this interval not displayed.   Basic Metabolic Panel: Recent Labs  Lab 08/08/20 0449 08/08/20 0449 08/09/20 6203 08/09/20 0816 08/10/20 0522 08/11/20 0626 08/12/20 0613 08/13/20 0445 08/14/20 0518  NA 146*   < > 144   < > 146* 149* 142 139 140  K 3.3*   < > 3.1*   < > 4.1 3.5 3.5 4.4 3.7  CL 99   < > 90*   < > 96* 95* 95* 96* 99  CO2 36*   < > 38*   < > 33* 37* 37* 35* 32  GLUCOSE 99   < > 102*   < > 108* 121* 119* 105* 90  BUN 16   < > 19   < > 28* 33* 28* 22 12  CREATININE 0.64   < >  0.83   < > 0.79 0.84 0.73 0.77 0.70  CALCIUM 8.3*   < > 8.8*   < > 9.1 9.2 8.6* 9.1 8.9  MG 2.2  --  1.8  --   --  2.2  --  2.4 2.1  PHOS 2.3*  --   --   --   --   --   --   --   --    < > = values in this interval not displayed.   GFR: Estimated Creatinine Clearance: 37 mL/min (by C-G formula based on SCr of 0.7 mg/dL). Liver Function Tests: Recent Labs  Lab 08/08/20 0449 08/09/20 0816 08/10/20 0522 08/11/20 0626  AST 15 18 18 23   ALT 11 14 13 14   ALKPHOS 60 62 59 58  BILITOT 0.7 1.8* 1.5* 0.9  PROT 5.8* 5.8* 6.3* 6.2*  ALBUMIN 3.3* 3.4* 3.6 3.6   No results for input(s): LIPASE, AMYLASE in the last 168 hours. No results for input(s): AMMONIA in the last 168 hours. Coagulation Profile: No results for input(s): INR, PROTIME in the last 168 hours. Cardiac Enzymes: No results for input(s): CKTOTAL, CKMB, CKMBINDEX, TROPONINI in the last 168 hours. BNP (last 3 results) No results for input(s): PROBNP in the last 8760 hours. HbA1C: No results for input(s): HGBA1C in the last 72 hours. CBG: No results for input(s): GLUCAP in the last 168 hours. Lipid Profile: No results for input(s): CHOL, HDL, LDLCALC, TRIG, CHOLHDL, LDLDIRECT in the last 72 hours. Thyroid Function Tests: No  results for input(s): TSH, T4TOTAL, FREET4, T3FREE, THYROIDAB in the last 72 hours. Anemia Panel: Recent Labs    08/12/20 0613  FERRITIN 19  TIBC 487*  IRON 17*   Sepsis Labs: Recent Labs  Lab 08/10/20 0522 08/11/20 0626  PROCALCITON <0.10 <0.10    Recent Results (from the past 240 hour(s))  SARS Coronavirus 2 by RT PCR (hospital order, performed in [facility] hospital lab) Nasopharyngeal Nasopharyngeal Swab     Status: None   Collection Time: 08/04/20 10:53 PM   Specimen: Nasopharyngeal Swab  Result Value Ref Range Status   SARS Coronavirus 2 NEGATIVE NEGATIVE Final    Comment: (NOTE) SARS-CoV-2 target nucleic acids are NOT DETECTED.  The SARS-CoV-2 RNA is generally detectable in upper  and lower respiratory specimens during the acute phase of infection. The lowest concentration of SARS-CoV-2 viral copies this assay can detect is 250 copies / mL. A negative result does not preclude SARS-CoV-2 infection and should not be used as the sole basis for treatment or other patient management decisions.  A negative result may occur with improper specimen collection / handling, submission of specimen other than nasopharyngeal swab, presence of viral mutation(s) within the areas targeted by this assay, and inadequate number of viral copies (<250 copies / mL). A negative result must be combined with clinical observations, patient history, and epidemiological information.  Fact Sheet for Patients:   StrictlyIdeas.no  Fact Sheet for Healthcare Providers: BankingDealers.co.za  This test is not yet approved or  cleared by the [place] FDA and has been authorized for detection and/or diagnosis of SARS-CoV-2 by FDA under an Emergency Use Authorization (EUA).  This EUA will remain in effect (meaning this test can be used) for the duration of the COVID-19 declaration under Section 564(b)(1) of the Act, 21 U.S.C. section 360bbb-3(b)(1), unless the authorization is terminated or revoked sooner.  Performed at [facility], [address] [place],  [address]       Imaging Studies   ECHOCARDIOGRAM COMPLETE  Result Date: 08/13/2020    ECHOCARDIOGRAM REPORT   Patient Name:   Jackie Turner Date of Exam: 08/13/2020 Medical Rec #:  [id number]             Height:       59.0 in Accession #:    [id number]            Weight:       112.2 lb Date of Birth:  03-17-1936             BSA:          1.443 m Patient Age:    84 years              BP:           91/61 mmHg Patient Gender: F                     HR:           82 bpm. Exam Location:  [facility] Procedure: 2D Echo, Cardiac Doppler and Color Doppler Indications:     Chf- acute  diastolic 814.48  History:         Patient has prior history of Echocardiogram examinations, most                  recent 06/13/2020. Risk Factors:Dyslipidemia.  Sonographer:     Sherrie Sport RDCS (AE) Referring Phys:  [id number] Floyce Stakes Shammara Jarrett Diagnosing Phys: Serafina Royals MD IMPRESSIONS  1. Left ventricular ejection fraction, by estimation, is 55 to 60%. The left ventricle has normal function. The left ventricle has no regional wall motion abnormalities. Left ventricular diastolic parameters were normal.  2. Right ventricular systolic function is normal. The right ventricular size is normal. There is normal pulmonary artery systolic pressure.  3. Left atrial size was mild to moderately dilated.  4. Right atrial size was mild to moderately dilated.  5. The mitral valve is normal in structure. Moderate mitral valve regurgitation.  6. Tricuspid valve regurgitation is moderate.  7. The aortic valve is normal in structure. Aortic valve regurgitation is trivial. FINDINGS  Left Ventricle: Left ventricular ejection fraction, by estimation, is 55 to 60%. The left ventricle has normal function. The left ventricle has no regional wall motion abnormalities. The left ventricular internal cavity size was normal in size. There is  no left ventricular hypertrophy. Left ventricular diastolic parameters were normal. Right Ventricle: The right ventricular size is normal. No increase in right ventricular wall thickness. Right ventricular systolic function is normal. There is normal pulmonary artery systolic pressure. The tricuspid regurgitant velocity is 2.31 m/s, and  with an assumed right atrial pressure of 10 mmHg, the estimated right ventricular systolic pressure is 52.8 mmHg. Left Atrium: Left atrial size was mild to moderately dilated. Right Atrium: Right atrial size was mild to moderately dilated. Pericardium: There is no evidence of pericardial effusion. Mitral Valve: The mitral valve is normal in structure. Moderate mitral  valve regurgitation. Tricuspid Valve: The tricuspid valve is normal in structure. Tricuspid valve regurgitation is moderate. Aortic Valve: The aortic valve is normal in structure. Aortic valve regurgitation is trivial. Aortic valve mean gradient measures 3.5 mmHg. Aortic valve peak gradient measures 5.7 mmHg. Aortic valve area, by VTI measures 2.41 cm. Pulmonic Valve: The pulmonic valve was normal in structure. Pulmonic valve regurgitation is not visualized. Aorta: The aortic root and ascending aorta are structurally normal, with no evidence of dilitation. IAS/Shunts: No atrial level shunt detected by color Turner Doppler.  LEFT VENTRICLE PLAX 2D LVIDd:         4.09 cm LVIDs:         2.89 cm LV PW:         1.13 cm LV IVS:        1.36 cm LVOT diam:     2.00 cm LV SV:         50 LV SV Index:   35 LVOT Area:     3.14 cm  RIGHT VENTRICLE RV S prime:     9.03 cm/s TAPSE (M-mode): 2.7 cm LEFT ATRIUM           Index       RIGHT ATRIUM           Index LA diam:      4.50 cm 3.12 cm/m  RA Area:     17.40 cm LA Vol (A4C): 58.1 ml 40.27 ml/m RA Volume:   44.90 ml  31.12 ml/m  AORTIC VALVE                   PULMONIC VALVE AV Area (Vmax):    2.24 cm    PV Vmax:        0.49 m/s AV Area (Vmean):   2.28 cm    PV Peak grad:   1.0 mmHg AV Area (VTI):     2.41 cm    RVOT Peak grad: 2 mmHg AV Vmax:  119.00 cm/s AV Vmean:          82.200 cm/s AV VTI:            0.207 m AV Peak Grad:      5.7 mmHg AV Mean Grad:      3.5 mmHg LVOT Vmax:         85.00 cm/s LVOT Vmean:        59.700 cm/s LVOT VTI:          0.159 m LVOT/AV VTI ratio: 0.77  AORTA Ao Root diam: 2.60 cm MITRAL VALVE               TRICUSPID VALVE MV Area (PHT): 4.06 cm    TR Peak grad:   21.3 mmHg MV Decel Time: 187 msec    TR Vmax:        231.00 cm/s MV E velocity: 82.90 cm/s                            SHUNTS                            Systemic VTI:  0.16 m                            Systemic Diam: 2.00 cm Serafina Royals MD Electronically signed by Serafina Royals MD Signature Date/Time: 08/13/2020/2:06:31 PM    Final      Medications   Scheduled Meds:  Chlorhexidine Gluconate Cloth  6 each Topical Daily   doxepin  30 mg Oral QHS   fluticasone furoate-vilanterol  1 puff Inhalation Daily   And   umeclidinium bromide  1 puff Inhalation Daily   furosemide  40 mg Oral Daily   ipratropium-albuterol  3 mL Nebulization Once   ipratropium-albuterol  3 mL Nebulization TID   lidocaine  1 patch Transdermal Q24H   mouth rinse  15 mL Mouth Rinse BID   metoprolol tartrate  25 mg Oral BID   oxybutynin  15 mg Oral QHS   pantoprazole  40 mg Oral QAC breakfast   pentosan polysulfate  100 mg Oral BID   vitamin B-12  1,000 mcg Oral Daily   Continuous Infusions:  sodium chloride 500 mL (08/10/20 1012)   sodium chloride 800 mL/hr at 08/14/20 1336       LOS: 10 days    Time spent: 25 minutes with greater than 50% spent in coordination of care and direct patient contact.    Ezekiel Slocumb, DO [facility]  08/14/2020, 4:27 PM    If 7PM-7AM, please contact night-coverage. How to contact the [facility] Attending or Consulting provider [place] or covering provider during after hours [place], for this patient?    Check the care team in [facility] and look for a) attending/consulting [facility] provider listed and b) the [facility] team listed Log into [email or web address] and use [facility]'s universal password to access. If you do not have the password, please contact the hospital operator. Locate the [facility] provider you are looking for under [facility] and page to a number that you can be directly reached. If you still have difficulty reaching the provider, please page the [facility] (Director on Call) for the Hospitalists listed on amion for assistance.

## 2020-08-14 NOTE — Progress Notes (Signed)
Pt leaving unit for procedure

## 2020-08-14 NOTE — Progress Notes (Signed)
Occupational Therapy Treatment Patient Details Name: Jackie Turner MRN: [id number] DOB: [birth year] Today's Date: 08/14/2020    History of present illness Jackie Turner is a 84 y.o. female with past medical history of asthma, bronchiectasis, spinal stenosis, osteoporosis s/p compression fracture x2, and HLD who presents to the ED complaining of hand pain and presenting with insidious hypoxia without SOB.   OT comments  Upon entering the room, pt seated in recliner chair with no c/o pain. Pt on 2 L O2 via [place] with O2 saturation 96%-99%. Pt's resting HR in the 120's initially. Pt reports she recently used bathroom and returned back to recliner prior to OT arrival. OT provided pt with paper handout for energy conservation with therapist providing information and specific examples related to self care and IADL tasks. Pt very receptive to information and asking appropriate questions. OT also providing pt with paper handout for B UE strengthening exercises with use of level 1 theraband. OT demonstrating exercises and pt returning with min cuing for proper technique. 2 sets of 10 chest pulls, straight arm raise, shoulder diagonals, bicep curls, and alternating punches. Pt then reports need for toileting and manages all tele and O2 lines with increased time and close supervision for transfer onto [facility]. Pt performs hygiene from seated position and then returns to recliner chair. Pt continues to benefit from acute OT intervention with continued recommendation for HHOT to further address deficits after discharge home.   Follow Up Recommendations  Home health OT;Supervision - Intermittent    Equipment Recommendations  3 in 1 bedside commode       Precautions / Restrictions Precautions Precautions: Fall Restrictions Weight Bearing Restrictions: No       Mobility Bed Mobility    General bed mobility comments: Pt seated in recliner chair at end of session  Transfers Overall transfer  level: Needs assistance   Transfers: Sit to/from Stand Sit to Stand: Supervision         General transfer comment: elevated HR from 100-123    Balance Overall balance assessment: Needs assistance Sitting-balance support: Feet supported Sitting balance-Leahy Scale: Good     Standing balance support: During functional activity Standing balance-Leahy Scale: Fair        ADL either performed or assessed with clinical judgement   ADL Overall ADL's : Needs assistance/impaired      Toilet Transfer: Supervision/safety;Ambulation;BSC   Toileting- Clothing Manipulation and Hygiene: Supervision/safety;Sitting/lateral lean         General ADL Comments: Pt transferred to [facility] with close supervision and without use of AD. Pt able to manage lines herself with increased time     Vision Baseline Vision/History: Wears glasses Wears Glasses: At all times Patient Visual Report: No change from baseline            Cognition Arousal/Alertness: Awake/alert Behavior During Therapy: WFL for tasks assessed/performed Overall Cognitive Status: Within Functional Limits for tasks assessed                      Pertinent Vitals/ Pain       Pain Assessment: No/denies pain         Frequency  Min 2X/week        Progress Toward Goals  OT Goals(current goals can now be found in the care plan section)  Progress towards OT goals: Progressing toward goals  Acute Rehab OT Goals Patient Stated Goal: to go home OT Goal Formulation: With patient Time For Goal Achievement: 08/24/20 Potential to Achieve Goals:  Good  Plan Discharge plan remains appropriate       AM-PAC OT "6 Clicks" Daily Activity     Outcome Measure   Help from another person eating meals?: A Little Help from another person taking care of personal grooming?: A Little Help from another person toileting, which includes using toliet, bedpan, or urinal?: A Little Help from another person bathing (including washing,  rinsing, drying)?: A Little Help from another person to put on and taking off regular upper body clothing?: A Little Help from another person to put on and taking off regular lower body clothing?: A Little 6 Click Score: 18    End of Session Equipment Utilized During Treatment: Oxygen (2L via [place])  OT Visit Diagnosis: Other abnormalities of gait and mobility (R26.89);Muscle weakness (generalized) (M62.81)   Activity Tolerance Treatment limited secondary to medical complications (Comment)   Patient Left in chair;with call bell/phone within reach;with chair alarm set           Time: 1002-1045 OT Time Calculation (min): 43 min  Charges: OT General Charges $OT Visit: 1 Visit OT Treatments $Self Care/Home Management : 23-37 mins $Therapeutic Exercise: 8-22 mins  Darleen Crocker, MS, OTR/L , CBIS ascom [phone]  08/14/20, 12:52 PM

## 2020-08-14 NOTE — Transfer of Care (Signed)
Immediate Anesthesia Transfer of Care Note  Patient: Brandolyn Shortridge  Procedure(s) Performed: COLONOSCOPY WITH PROPOFOL (N/A )  Patient Location: PACU  Anesthesia Type:General  Level of Consciousness: drowsy  Airway & Oxygen Therapy: Patient Spontanous Breathing and Patient connected to nasal cannula oxygen  Post-op Assessment: Report given to RN and Post -op Vital signs reviewed and stable  Post vital signs: Reviewed and stable  Last Vitals:  Vitals Value Taken Time  BP 122/78 08/14/20 1341  Temp    Pulse 97 08/14/20 1342  Resp 33 08/14/20 1342  SpO2 100 % 08/14/20 1342  Vitals shown include unvalidated device data.  Last Pain:  Vitals:   08/14/20 1124  TempSrc: Oral  PainSc:       Patients Stated Pain Goal: 3 (13/24/40 1027)  Complications: No complications documented.

## 2020-08-14 NOTE — Care Management Important Message (Signed)
Important Message  Patient Details  Name: Jackie Turner MRN: [id number] Date of Birth: 06/06/1936   Medicare Important Message Given:  Yes     Dannette Barbara 08/14/2020, 1:40 PM

## 2020-08-14 NOTE — Progress Notes (Signed)
[facility] Cardiology [facility] Encounter Note  Patient: Ranell Finelli / Admit Date: 08/04/2020 / Date of Encounter: 08/14/2020, 8:42 AM   Subjective: Patient overall feeling relatively better today than yesterday although still somewhat weak and fatigued with some shortness of breath but apparently closer to her baseline at home.  The patient has had good heart rate control with diltiazem metoprolol combination but yesterday had much lower heart rate and lower blood pressure requiring holding these medications until this a.m.  We will follow for needed reinstatement later today but will continue to work on heart rate control between 60 and 90 bpm Review of Systems: Positive for: Shortness of breath Negative for: Vision change, hearing change, syncope, dizziness, nausea, vomiting,diarrhea, bloody stool, stomach pain, cough, congestion, diaphoresis, urinary frequency, urinary pain,skin lesions, skin rashes Others previously listed  Objective: Telemetry: Atrial fibrillation with more slow rate Physical Exam: Blood pressure (!) 146/89, pulse 97, temperature 98.1 F (36.7 C), resp. rate 16, height 4\' 11"  (1.499 m), weight 50.9 kg, SpO2 95 %. Body mass index is 22.66 kg/m. General: Well developed, well nourished, in no acute distress. Head: Normocephalic, atraumatic, sclera non-icteric, no xanthomas, nares are without discharge. Neck: No apparent masses Lungs: Normal respirations with 2 wheezes, n some rhonchi, no rales , no crackles   Heart: Irregular rate and rhythm, normal S1 S2, no murmur, no rub, no gallop, PMI is normal size and placement, carotid upstroke normal without bruit, jugular venous pressure normal Abdomen: Soft, non-tender, non-distended with normoactive bowel sounds. No hepatosplenomegaly. Abdominal aorta is normal size without bruit Extremities: Trace edema, no clubbing, no cyanosis, no ulcers,  Peripheral: 2+ radial, 2+ femoral, 2+ dorsal pedal pulses Neuro: Alert  and oriented. Moves all extremities spontaneously. Psych:  Responds to questions appropriately with a normal affect.   Intake/Output Summary (Last 24 hours) at 08/14/2020 0842 Last data filed at 08/13/2020 1850 Gross per 24 hour  Intake 1819.34 ml  Output 3 ml  Net 1816.34 ml    Inpatient Medications:  . Chlorhexidine Gluconate Cloth  6 each Topical Daily  . doxepin  30 mg Oral QHS  . fluticasone furoate-vilanterol  1 puff Inhalation Daily   And  . umeclidinium bromide  1 puff Inhalation Daily  . furosemide  40 mg Oral Daily  . ipratropium-albuterol  3 mL Nebulization Once  . ipratropium-albuterol  3 mL Nebulization TID  . lidocaine  1 patch Transdermal Q24H  . mouth rinse  15 mL Mouth Rinse BID  . metoprolol tartrate  25 mg Oral BID  . oxybutynin  15 mg Oral QHS  . pantoprazole  40 mg Oral QAC breakfast  . pentosan polysulfate  100 mg Oral BID  . potassium chloride  40 mEq Oral Once  . vitamin B-12  1,000 mcg Oral Daily   Infusions:  . sodium chloride 500 mL (08/10/20 1012)  . sodium chloride 10 mL/hr at 08/13/20 1720    Labs: Recent Labs    08/13/20 0445 08/14/20 0518  NA 139 140  K 4.4 3.7  CL 96* 99  CO2 35* 32  GLUCOSE 105* 90  BUN 22 12  CREATININE 0.77 0.70  CALCIUM 9.1 8.9  MG 2.4 2.1   No results for input(s): AST, ALT, ALKPHOS, BILITOT, PROT, ALBUMIN in the last 72 hours. Recent Labs    08/13/20 0445 08/14/20 0518  WBC 5.8 5.4  HGB 9.5* 9.7*  HCT 33.8* 33.9*  MCV 82.0 81.5  PLT 277 274   No results for input(s): CKTOTAL,  CKMB, TROPONINI in the last 72 hours. Invalid input(s): POCBNP No results for input(s): HGBA1C in the last 72 hours.   Weights: Filed Weights   08/10/20 0507 08/11/20 0700 08/12/20 0511  Weight: 50 kg 49.8 kg 50.9 kg     Radiology/Studies:  CT HEAD WO CONTRAST  Result Date: 08/08/2020 CLINICAL DATA:  Delirium with altered mental status EXAM: CT HEAD WITHOUT CONTRAST TECHNIQUE: Contiguous axial images were obtained  from the base of the skull through the vertex without intravenous contrast. COMPARISON:  None. FINDINGS: Brain: There is age related volume loss. There is no intracranial mass, hemorrhage, extra-axial fluid collection, or midline shift. There is slight small vessel disease in the centra semiovale bilaterally. No acute infarct is evident. Vascular: No hyperdense vessel. There is calcification in each carotid siphon region. Skull: The bony calvarium appears intact. Sinuses/Orbits: There is mucosal thickening in several ethmoid air cells. Other visualized paranasal sinuses are clear. Visualized orbits appear symmetric bilaterally. Other: Mastoid air cells are clear. IMPRESSION: Age related volume loss with mild periventricular small vessel disease. No acute infarct. No mass or hemorrhage. There are foci of arterial vascular calcification. There is mucosal thickening in several ethmoid air cells. Electronically Signed   By: Lowella Grip III M.D.   On: 08/08/2020 13:43   CT ANGIO CHEST PE W OR WO CONTRAST  Result Date: 08/08/2020 CLINICAL DATA:  Chest pain, shortness of breath, pleural effusion, rule out pulmonary embolism EXAM: CT ANGIOGRAPHY CHEST WITH CONTRAST TECHNIQUE: Multidetector CT imaging of the chest was performed using the standard protocol during bolus administration of intravenous contrast. Multiplanar CT image reconstructions and MIPs were obtained to evaluate the vascular anatomy. CONTRAST:  35mL OMNIPAQUE IOHEXOL 350 MG/ML SOLN COMPARISON:  06/12/2020 FINDINGS: Cardiovascular: Satisfactory opacification of the pulmonary arteries to the segmental level. No evidence of pulmonary embolism. Mild cardiomegaly. Trace pericardial effusion. Aortic atherosclerosis Mediastinum/Nodes: No enlarged mediastinal, hilar, or axillary lymph nodes. Thyroid gland, trachea, and esophagus demonstrate no significant findings. Lungs/Pleura: Moderate bilateral pleural effusions and associated atelectasis or  consolidation. Redemonstrated dense scarring of the left lung base. Upper Abdomen: No acute abnormality. Musculoskeletal: No chest wall abnormality. Redemonstrated high-grade wedge deformity of the T10 vertebral body. Review of the MIP images confirms the above findings. IMPRESSION: 1. Negative examination for pulmonary embolism. 2. Moderate bilateral pleural effusions and associated atelectasis or consolidation. 3. Aortic Atherosclerosis (ICD10-I70.0). Electronically Signed   By: Eddie Candle M.D.   On: 08/08/2020 14:13   DG Chest Port 1 View  Result Date: 08/10/2020 CLINICAL DATA:  Hypoxia EXAM: PORTABLE CHEST 1 VIEW COMPARISON:  Chest radiograph August 07, 2020 and chest CT August 08, 2020 FINDINGS: There are persistent pleural effusions bilaterally with atelectasis and ill-defined airspace opacity in the lung bases. There is also atelectatic change in the left mid lung region. There is mild interstitial edema. There is cardiomegaly with pulmonary venous hypertension. No adenopathy. There is aortic atherosclerosis. Monitor device over the left hemithorax anteriorly. T10 vertebral body fracture better seen on recent CT. IMPRESSION: Cardiomegaly with a degree of pulmonary vascular congestion. Pleural effusions with a degree of interstitial edema. Suspect a degree of underlying congestive heart failure. There is atelectatic change with patchy airspace opacity in the lung bases. There may be a degree of alveolar edema or possible pneumonia in the lung bases. Appearance similar to recent studies. Aortic Atherosclerosis (ICD10-I70.0). Electronically Signed   By: Lowella Grip III M.D.   On: 08/10/2020 08:13   DG Chest [facility] 1 View  Result  Date: 08/07/2020 CLINICAL DATA:  Increased shortness of breath EXAM: PORTABLE CHEST 1 VIEW COMPARISON:  08/06/2020 FINDINGS: Interval increase in diffuse bilateral interstitial pulmonary opacity. Unchanged elevation of the left hemidiaphragm. Probable small layering  bilateral pleural effusions. Mild cardiomegaly. IMPRESSION: Interval increase in diffuse bilateral interstitial pulmonary opacity, which may reflect edema and/or infection. Probable small layering bilateral pleural effusions. Electronically Signed   By: Eddie Candle M.D.   On: 08/07/2020 10:15   DG Chest Port 1 View  Result Date: 08/06/2020 CLINICAL DATA:  Wheezing, anemia EXAM: PORTABLE CHEST 1 VIEW COMPARISON:  06/12/2020 chest radiograph. FINDINGS: Chronic prominent elevation of the left hemidiaphragm. Stable cardiomediastinal silhouette with mild cardiomegaly. No pneumothorax. Small bilateral pleural effusions are increased bilaterally. Patchy consolidation at both lung bases, worsened. IMPRESSION: 1. Chronic prominent left hemidiaphragm elevation. Worsened patchy consolidation at both lung bases, which could represent any combination of atelectasis, aspiration or pneumonia. 2. Mild cardiomegaly. Small bilateral pleural effusions are increased bilaterally. Electronically Signed   By: Ilona Sorrel M.D.   On: 08/06/2020 11:55   ECHOCARDIOGRAM COMPLETE  Result Date: 08/13/2020    ECHOCARDIOGRAM REPORT   Patient Name:   AURA BIBBY Carruthers Date of Exam: 08/13/2020 Medical Rec #:  [id number]             Height:       59.0 in Accession #:    [id number]            Weight:       112.2 lb Date of Birth:  1936-06-24             BSA:          1.443 m Patient Age:    5 years              BP:           91/61 mmHg Patient Gender: F                     HR:           82 bpm. Exam Location:  [facility] Procedure: 2D Echo, Cardiac Doppler and Color Doppler Indications:     Chf- acute diastolic 765.46  History:         Patient has prior history of Echocardiogram examinations, most                  recent 06/13/2020. Risk Factors:Dyslipidemia.  Sonographer:     Sherrie Sport RDCS (AE) Referring Phys:  [id number] Floyce Stakes GRIFFITH Diagnosing Phys: Serafina Royals MD IMPRESSIONS  1. Left ventricular ejection fraction, by estimation, is 55  to 60%. The left ventricle has normal function. The left ventricle has no regional wall motion abnormalities. Left ventricular diastolic parameters were normal.  2. Right ventricular systolic function is normal. The right ventricular size is normal. There is normal pulmonary artery systolic pressure.  3. Left atrial size was mild to moderately dilated.  4. Right atrial size was mild to moderately dilated.  5. The mitral valve is normal in structure. Moderate mitral valve regurgitation.  6. Tricuspid valve regurgitation is moderate.  7. The aortic valve is normal in structure. Aortic valve regurgitation is trivial. FINDINGS  Left Ventricle: Left ventricular ejection fraction, by estimation, is 55 to 60%. The left ventricle has normal function. The left ventricle has no regional wall motion abnormalities. The left ventricular internal cavity size was normal in size. There is  no left ventricular hypertrophy. Left ventricular diastolic parameters were normal.  Right Ventricle: The right ventricular size is normal. No increase in right ventricular wall thickness. Right ventricular systolic function is normal. There is normal pulmonary artery systolic pressure. The tricuspid regurgitant velocity is 2.31 m/s, and  with an assumed right atrial pressure of 10 mmHg, the estimated right ventricular systolic pressure is 93.7 mmHg. Left Atrium: Left atrial size was mild to moderately dilated. Right Atrium: Right atrial size was mild to moderately dilated. Pericardium: There is no evidence of pericardial effusion. Mitral Valve: The mitral valve is normal in structure. Moderate mitral valve regurgitation. Tricuspid Valve: The tricuspid valve is normal in structure. Tricuspid valve regurgitation is moderate. Aortic Valve: The aortic valve is normal in structure. Aortic valve regurgitation is trivial. Aortic valve mean gradient measures 3.5 mmHg. Aortic valve peak gradient measures 5.7 mmHg. Aortic valve area, by VTI measures 2.41  cm. Pulmonic Valve: The pulmonic valve was normal in structure. Pulmonic valve regurgitation is not visualized. Aorta: The aortic root and ascending aorta are structurally normal, with no evidence of dilitation. IAS/Shunts: No atrial level shunt detected by color flow Doppler.  LEFT VENTRICLE PLAX 2D LVIDd:         4.09 cm LVIDs:         2.89 cm LV PW:         1.13 cm LV IVS:        1.36 cm LVOT diam:     2.00 cm LV SV:         50 LV SV Index:   35 LVOT Area:     3.14 cm  RIGHT VENTRICLE RV S prime:     9.03 cm/s TAPSE (M-mode): 2.7 cm LEFT ATRIUM           Index       RIGHT ATRIUM           Index LA diam:      4.50 cm 3.12 cm/m  RA Area:     17.40 cm LA Vol (A4C): 58.1 ml 40.27 ml/m RA Volume:   44.90 ml  31.12 ml/m  AORTIC VALVE                   PULMONIC VALVE AV Area (Vmax):    2.24 cm    PV Vmax:        0.49 m/s AV Area (Vmean):   2.28 cm    PV Peak grad:   1.0 mmHg AV Area (VTI):     2.41 cm    RVOT Peak grad: 2 mmHg AV Vmax:           119.00 cm/s AV Vmean:          82.200 cm/s AV VTI:            0.207 m AV Peak Grad:      5.7 mmHg AV Mean Grad:      3.5 mmHg LVOT Vmax:         85.00 cm/s LVOT Vmean:        59.700 cm/s LVOT VTI:          0.159 m LVOT/AV VTI ratio: 0.77  AORTA Ao Root diam: 2.60 cm MITRAL VALVE               TRICUSPID VALVE MV Area (PHT): 4.06 cm    TR Peak grad:   21.3 mmHg MV Decel Time: 187 msec    TR Vmax:        231.00 cm/s MV E velocity: 82.90 cm/s  SHUNTS                            Systemic VTI:  0.16 m                            Systemic Diam: 2.00 cm Serafina Royals MD Electronically signed by Serafina Royals MD Signature Date/Time: 08/13/2020/2:06:31 PM    Final      Assessment and Recommendation  84 y.o. female with hypertension hyperlipidemia and apparent GI bleed with anemia and possible respiratory infection having acute onset paroxysmal nonvalvular atrial fibrillation with rapid ventricular rate exacerbated by above now much slower and more  controlled not needing medications this morning but continuing to be without evidence of current congestive heart failure or myocardial infarction currently at lowest risk possible for endoscopy for further evaluation of bleeding complications 1.  Reinstatement of beta-blocker but at much lower dose 2.  Abstain from anticoagulation at this time due to concerns of bleeding complications upper GI bleed and melena 3.  Further evaluation from gastroenterology including upper endoscopy colonoscopy as necessary without restriction at this time due to lowest risk possible 4.  Continue supportive care of pulmonary infection 5.  Further treatment options after about Signed, Serafina Royals M.D. FACC

## 2020-08-14 NOTE — Anesthesia Postprocedure Evaluation (Signed)
Anesthesia Post Note  Patient: [name]  Procedure(s) Performed: COLONOSCOPY WITH PROPOFOL (N/A )  Patient location during evaluation: Endoscopy Anesthesia Type: General Level of consciousness: awake and alert Pain management: pain level controlled Vital Signs Assessment: post-procedure vital signs reviewed and stable Respiratory status: spontaneous breathing, nonlabored ventilation, respiratory function stable and patient connected to nasal cannula oxygen Cardiovascular status: blood pressure returned to baseline and stable Postop Assessment: no apparent nausea or vomiting Anesthetic complications: no   No complications documented.   Last Vitals:  Vitals:   08/14/20 1402 08/14/20 1412  BP: 120/79   Pulse: 95 97  Resp: (!) 30 (!) 23  Temp:    SpO2: 100% 98%    Last Pain:  Vitals:   08/14/20 1412  TempSrc:   PainSc: 0-No pain                 Jackie Turner Jackie Turner

## 2020-08-14 NOTE — Anesthesia Preprocedure Evaluation (Signed)
Anesthesia Evaluation  Patient identified by MRN, date of birth, ID band Patient awake  General Assessment Comment:Patient on nasal cannula, in no distress  Reviewed: Allergy & Precautions, NPO status , Patient's Chart, lab work & pertinent test results  History of Anesthesia Complications Negative for: history of anesthetic complications  Airway Mallampati: II  TM Distance: <3 FB Neck ROM: limited    Dental  (+) Chipped, Poor Dentition, Dental Advisory Given, Missing   Pulmonary asthma , neg sleep apnea, COPD,  COPD inhaler and oxygen dependent, Patient abstained from smoking.Not current smoker,     + decreased breath sounds      Cardiovascular Exercise Tolerance: Poor METS(-) hypertension(-) CAD and (-) Past MI + dysrhythmias Atrial Fibrillation  Rhythm:Irregular - Systolic murmurs    Neuro/Psych  Headaches, PSYCHIATRIC DISORDERS Depression  Neuromuscular disease    GI/Hepatic PUD, GERD  ,(+)     (-) substance abuse  ,   Endo/Other  neg diabetes  Renal/GU negative Renal ROS     Musculoskeletal   Abdominal   Peds  Hematology  (+) Blood dyscrasia, anemia ,   Anesthesia Other Findings Past Medical History: No date: Allergy No date: Asthma No date: Hyperlipidemia No date: Osteoporosis  Reproductive/Obstetrics                             Anesthesia Physical  Anesthesia Plan  ASA: III  Anesthesia Plan: General   Post-op Pain Management:    Induction: Intravenous  PONV Risk Score and Plan: 3 and Propofol infusion and TIVA  Airway Management Planned: Natural Airway  Additional Equipment: None  Intra-op Plan:   Post-operative Plan:   Informed Consent: I have reviewed the patients History and Physical, chart, labs and discussed the procedure including the risks, benefits and alternatives for the proposed anesthesia with the patient or authorized representative who has indicated  his/her understanding and acceptance.     Dental advisory given  Plan Discussed with: CRNA and Surgeon  Anesthesia Plan Comments: (Patient counseled on being higher risk for anesthesia due to comorbidities: oxygen-dependent COPD, Afib w RVR. Patient was told about increased risk of cardiac and respiratory events, including death. Patient understands.  Patient consented for risks of anesthesia including but not limited to:  - adverse reactions to medications - risk of intubation if required - damage to eyes, teeth, lips or other oral mucosa - nerve damage due to positioning  - sore throat or hoarseness - Damage to heart, brain, nerves, lungs, other parts of body or loss of life  Patient voiced understanding.)        Anesthesia Quick Evaluation

## 2020-08-14 NOTE — Plan of Care (Signed)
  Problem: Education: Goal: Knowledge of General Education information will improve Description: Including pain rating scale, medication(s)/side effects and non-pharmacologic comfort measures Outcome: Progressing   Problem: Education: Goal: Ability to identify signs and symptoms of gastrointestinal bleeding will improve Outcome: Progressing   Problem: Bowel/Gastric: Goal: Will show no signs and symptoms of gastrointestinal bleeding Outcome: Progressing

## 2020-08-14 NOTE — Progress Notes (Signed)
Physical Therapy Treatment Patient Details Name: Jackie Turner MRN: [id number] DOB: 10/29/1936 Today's Date: 08/14/2020    History of Present Illness Jackie Turner is a 84 y.o. female with past medical history of asthma, bronchiectasis, spinal stenosis, osteoporosis s/p compression fracture x2, and HLD who presents to the ED complaining of hand pain and presenting with insidious hypoxia without SOB.    PT Comments    Pt received seated in recliner upon arrival to room.  Pt agreeable to therapy.  Pt was able to perform sit <> stand with supervision and CGA with gait training.  Pt received verbal cuing for keeping gaze upright rather than looking at her feet.  Pt able to ambulate twice around the nursing station with two minimally noticeable LOB.  Pt able to correct without any assistance from therapist and continued to ambulate back to room.  Pt was left in recliner with all needs met and call bell within reach.  Current discharge plans to HHPT with supervision remain appropriate at this time.  Pt will continue to benefit from skilled therapy in order to address deficits listed below.     Follow Up Recommendations  Home health PT;Supervision - Intermittent     Equipment Recommendations  None recommended by PT (pt has SPC and walkers at home)    Recommendations for Other Services       Precautions / Restrictions Precautions Precautions: Fall Restrictions Weight Bearing Restrictions: No    Mobility  Bed Mobility               General bed mobility comments: Pt seated in recliner chair at beginning/end of session  Transfers Overall transfer level: Needs assistance   Transfers: Sit to/from Stand Sit to Stand: Supervision            Ambulation/Gait Ambulation/Gait assistance: Min guard Gait Distance (Feet): 240 Feet   Gait Pattern/deviations: Step-through pattern;Decreased stride length Gait velocity: decreased   General Gait Details: Pt performed  well with ambulating with CGA.  2 episodes of min LOB noted that pt was able to correct with no assistance from therapist.   Stairs             Wheelchair Mobility    Modified Rankin (Stroke Patients Only)       Balance Overall balance assessment: Needs assistance Sitting-balance support: Feet supported Sitting balance-Leahy Scale: Good     Standing balance support: During functional activity Standing balance-Leahy Scale: Fair                              Cognition Arousal/Alertness: Awake/alert Behavior During Therapy: WFL for tasks assessed/performed Overall Cognitive Status: Within Functional Limits for tasks assessed                                        Exercises Other Exercises Other Exercises: Pt able to ambulate in room and around nursing station twice with minimal verbal cuing for stability and to keep looking upright.    General Comments        Pertinent Vitals/Pain Pain Assessment: No/denies pain    Home Living                      Prior Function            PT Goals (current goals can now be found in  the care plan section) Acute Rehab PT Goals Patient Stated Goal: to go home PT Goal Formulation: With patient Time For Goal Achievement: 08/25/20 Potential to Achieve Goals: Good Progress towards PT goals: Progressing toward goals    Frequency    Min 2X/week      PT Plan Current plan remains appropriate    Co-evaluation              AM-PAC PT "6 Clicks" Mobility   Outcome Measure  Help needed turning from your back to your side while in a flat bed without using bedrails?: A Little Help needed moving from lying on your back to sitting on the side of a flat bed without using bedrails?: A Little Help needed moving to and from a bed to a chair (including a wheelchair)?: A Little Help needed standing up from a chair using your arms (e.g., wheelchair or bedside chair)?: A Little Help needed to  walk in hospital room?: A Little Help needed climbing 3-5 steps with a railing? : A Little 6 Click Score: 18    End of Session Equipment Utilized During Treatment: Gait belt Activity Tolerance: Patient tolerated treatment well Patient left: in chair;with call bell/phone within reach Nurse Communication: Mobility status PT Visit Diagnosis: Other abnormalities of gait and mobility (R26.89);Difficulty in walking, not elsewhere classified (R26.2);Muscle weakness (generalized) (M62.81)     Time: 5831-6742 PT Time Calculation (min) (ACUTE ONLY): 29 min  Charges:  $Gait Training: 23-37 mins                     Gwenlyn Saran, PT, DPT 08/14/20, 4:50 PM

## 2020-08-14 NOTE — Op Note (Addendum)
[facility] Patient Name: Jackie Turner Procedure Date: 08/14/2020 12:54 PM MRN: [id number] Account #: [id number] Date of Birth: [birth year] Admit Type: Inpatient Age: 84 Room: [facility] ENDO ROOM 4 Gender: Female Note Status: Finalized Procedure:             Colonoscopy Indications:           Melena Providers:             Lucilla Lame MD, MD Referring MD:          Mar Daring (Referring MD) Medicines:             Propofol per Anesthesia Complications:         No immediate complications. Procedure:             Pre-Anesthesia Assessment:                        - Prior to the procedure, a History and Physical was                         performed, and patient medications and allergies were                         reviewed. The patient's tolerance of previous                         anesthesia was also reviewed. The risks and benefits                         of the procedure and the sedation options and risks                         were discussed with the patient. All questions were                         answered, and informed consent was obtained. Prior                         Anticoagulants: The patient has taken Eliquis                         (apixaban), last dose was 4 days prior to procedure.                         ASA Grade Assessment: II - A patient with mild                         systemic disease. After reviewing the risks and                         benefits, the patient was deemed in satisfactory                         condition to undergo the procedure.                        After obtaining informed consent, the colonoscope was  passed under direct vision. Throughout the procedure,                         the patient's blood pressure, pulse, and oxygen                         saturations were monitored continuously. The                         Colonoscope was introduced through the anus and                          advanced to the the cecum, identified by appendiceal                         orifice and ileocecal valve. The patient tolerated the                         procedure well. The quality of the bowel preparation                         was good. The colonoscopy was extremely difficult due                         to restricted mobility of the colon. Successful                         completion of the procedure was aided by applying                         abdominal pressure. Findings:      The perianal and digital rectal examinations were normal.      A 15 mm polyp was found in the ascending colon ileocecal valve. The       polyp was sessile. Biopsies were taken with a cold forceps for histology.      Non-bleeding internal hemorrhoids were found during retroflexion. The       hemorrhoids were Grade I (internal hemorrhoids that do not prolapse). Impression:            - One 15 mm polyp in the ascending colon at the                         ileocecal valve. Biopsied. Polyp could not be removed                         due to the size and location.                        - Non-bleeding internal hemorrhoids. Recommendation:        - Return patient to hospital ward for ongoing care.                        - Resume previous diet.                        - Continue present medications.                        -  Await pathology results. Procedure Code(s):     --- Professional ---                        [phone], Colonoscopy, flexible; with biopsy, single or                         multiple Diagnosis Code(s):     --- Professional ---                        K92.1, Melena (includes Hematochezia)                        K63.5, Polyp of colon CPT copyright 2019 American [employer]. All rights reserved. The codes documented in this report are preliminary and upon coder review may  be revised to meet current compliance requirements. Lucilla Lame MD, MD 08/14/2020 1:41:28 PM This report has been  signed electronically. Number of Addenda: 0 Note Initiated On: 08/14/2020 12:54 PM Scope Withdrawal Time: 0 hours 6 minutes 53 seconds  Total Procedure Duration: 0 hours 34 minutes 43 seconds  Estimated Blood Loss:  Estimated blood loss: none.      [facility]

## 2020-08-15 ENCOUNTER — Encounter: Payer: Self-pay | Admitting: Gastroenterology

## 2020-08-15 DIAGNOSIS — J9621 Acute and chronic respiratory failure with hypoxia: Secondary | ICD-10-CM

## 2020-08-15 DIAGNOSIS — J9622 Acute and chronic respiratory failure with hypercapnia: Secondary | ICD-10-CM

## 2020-08-15 LAB — MAGNESIUM: Magnesium: 2 mg/dL (ref 1.7–2.4)

## 2020-08-15 LAB — CBC
HCT: 31.4 % — ABNORMAL LOW (ref 36.0–46.0)
Hemoglobin: 9.1 g/dL — ABNORMAL LOW (ref 12.0–15.0)
MCH: 23.3 pg — ABNORMAL LOW (ref 26.0–34.0)
MCHC: 29 g/dL — ABNORMAL LOW (ref 30.0–36.0)
MCV: 80.3 fL (ref 80.0–100.0)
Platelets: 271 10*3/uL (ref 150–400)
RBC: 3.91 MIL/uL (ref 3.87–5.11)
RDW: 18.9 % — ABNORMAL HIGH (ref 11.5–15.5)
WBC: 7.8 10*3/uL (ref 4.0–10.5)
nRBC: 0 % (ref 0.0–0.2)

## 2020-08-15 LAB — SURGICAL PATHOLOGY

## 2020-08-15 LAB — BASIC METABOLIC PANEL
Anion gap: 9 (ref 5–15)
BUN: 16 mg/dL (ref 8–23)
CO2: 34 mmol/L — ABNORMAL HIGH (ref 22–32)
Calcium: 8.7 mg/dL — ABNORMAL LOW (ref 8.9–10.3)
Chloride: 98 mmol/L (ref 98–111)
Creatinine, Ser: 0.7 mg/dL (ref 0.44–1.00)
GFR calc Af Amer: >60 mL/min (ref >60)
GFR calc non Af Amer: >60 mL/min (ref >60)
Glucose, Bld: 107 mg/dL — ABNORMAL HIGH (ref 70–99)
Potassium: 4 mmol/L (ref 3.5–5.1)
Sodium: 141 mmol/L (ref 135–145)

## 2020-08-15 LAB — HEMOGLOBIN AND HEMATOCRIT, BLOOD
HCT: 32.9 % — ABNORMAL LOW (ref 36.0–46.0)
Hemoglobin: 9.8 g/dL — ABNORMAL LOW (ref 12.0–15.0)

## 2020-08-15 MED ORDER — FUROSEMIDE 40 MG PO TABS
40.0000 mg | ORAL_TABLET | Freq: Every day | ORAL | 1 refills | Status: DC
Start: 2020-08-16 — End: 2020-08-24

## 2020-08-15 MED ORDER — METOPROLOL TARTRATE 25 MG PO TABS
25.0000 mg | ORAL_TABLET | Freq: Two times a day (BID) | ORAL | 1 refills | Status: DC
Start: 2020-08-15 — End: 2020-10-30

## 2020-08-15 MED ORDER — CYANOCOBALAMIN 1000 MCG PO TABS: 1000.0000 ug | ORAL_TABLET | Freq: Every day | ORAL | 1 refills | Status: DC

## 2020-08-15 NOTE — Progress Notes (Addendum)
Pt had a MEWS of yellow. VS taken on pt heart rate is elevated in 120's -130's. Pt did received scheduled metoprolol 25 mg PO. Pt BP 90/71 contacted Hassan Rowan, NP. New orders received on pt. H&H, CBG, and 250 ml bolus of LR.

## 2020-08-15 NOTE — Progress Notes (Signed)
[facility] Encounter Note  Patient: Jackie Turner / Admit Date: 08/04/2020 / Date of Encounter: 08/15/2020, 1:32 PM   Subjective: Patient tolerated endoscopy colonoscopy without evidence of complication.  There was found a sessile polyp possibly causing bleeding complications.  The patient has had good heart rate control with   metoprolol reinstatement now at 25 mg twice per day and a reasonably controlled since patient does not have any significant progression of anemia or bleeding and or respiratory abnormalities.   Review of Systems: Positive for: None Negative for: Vision change, hearing change, syncope, dizziness, nausea, vomiting,diarrhea, bloody stool, stomach pain, cough, congestion, diaphoresis, urinary frequency, urinary pain,skin lesions, skin rashes Others previously listed  Objective: Telemetry: Atrial fibrillation with more slow rate Physical Exam: Blood pressure (!) 98/56, pulse (!) 101, temperature 97.8 F (36.6 C), temperature source Oral, resp. rate 19, height 4\' 11"  (1.499 m), weight 49.9 kg, SpO2 98 %. Body mass index is 22.24 kg/m. General: Well developed, well nourished, in no acute distress. Head: Normocephalic, atraumatic, sclera non-icteric, no xanthomas, nares are without discharge. Neck: No apparent masses Lungs: Normal respirations with 2 wheezes, n some rhonchi, no rales , no crackles   Heart: Irregular rate and rhythm, normal S1 S2, no murmur, no rub, no gallop, PMI is normal size and placement, carotid upstroke normal without bruit, jugular venous pressure normal Abdomen: Soft, non-tender, non-distended with normoactive bowel sounds. No hepatosplenomegaly. Abdominal aorta is normal size without bruit Extremities: Trace edema, no clubbing, no cyanosis, no ulcers,  Peripheral: 2+ radial, 2+ femoral, 2+ dorsal pedal pulses Neuro: Alert and oriented. Moves all extremities spontaneously. Psych:  Responds to questions appropriately  with a normal affect.   Intake/Output Summary (Last 24 hours) at 08/15/2020 1332 Last data filed at 08/15/2020 0950 Gross per 24 hour  Intake 240 ml  Output 350 ml  Net -110 ml    Inpatient Medications:  . Chlorhexidine Gluconate Cloth  6 each Topical Daily  . doxepin  30 mg Oral QHS  . fluticasone furoate-vilanterol  1 puff Inhalation Daily   And  . umeclidinium bromide  1 puff Inhalation Daily  . furosemide  40 mg Oral Daily  . ipratropium-albuterol  3 mL Nebulization Once  . ipratropium-albuterol  3 mL Nebulization TID  . lidocaine  1 patch Transdermal Q24H  . mouth rinse  15 mL Mouth Rinse BID  . metoprolol tartrate  25 mg Oral BID  . oxybutynin  15 mg Oral QHS  . pantoprazole  40 mg Oral QAC breakfast  . pentosan polysulfate  100 mg Oral BID  . vitamin B-12  1,000 mcg Oral Daily   Infusions:  . sodium chloride 500 mL (08/10/20 1012)  . sodium chloride 800 mL/hr at 08/14/20 1336    Labs: Recent Labs    08/14/20 0518 08/15/20 0417  NA 140 141  K 3.7 4.0  CL 99 98  CO2 32 34*  GLUCOSE 90 107*  BUN 12 16  CREATININE 0.70 0.70  CALCIUM 8.9 8.7*  MG 2.1 2.0   No results for input(s): AST, ALT, ALKPHOS, BILITOT, PROT, ALBUMIN in the last 72 hours. Recent Labs    08/14/20 0518 08/14/20 0518 08/14/20 2341 08/15/20 0417  WBC 5.4  --   --  7.8  HGB 9.7*   < > 9.8* 9.1*  HCT 33.9*   < > 32.9* 31.4*  MCV 81.5  --   --  80.3  PLT 274  --   --  271   < > =  values in this interval not displayed.   No results for input(s): CKTOTAL, CKMB, TROPONINI in the last 72 hours. Invalid input(s): POCBNP No results for input(s): HGBA1C in the last 72 hours.   Weights: Filed Weights   08/11/20 0700 08/12/20 0511 08/15/20 0500  Weight: 49.8 kg 50.9 kg 49.9 kg     Radiology/Studies:  CT HEAD WO CONTRAST  Result Date: 08/08/2020 CLINICAL DATA:  Delirium with altered mental status EXAM: CT HEAD WITHOUT CONTRAST TECHNIQUE: Contiguous axial images were obtained from the  base of the skull through the vertex without intravenous contrast. COMPARISON:  None. FINDINGS: Brain: There is age related volume loss. There is no intracranial mass, hemorrhage, extra-axial fluid collection, or midline shift. There is slight small vessel disease in the centra semiovale bilaterally. No acute infarct is evident. Vascular: No hyperdense vessel. There is calcification in each carotid siphon region. Skull: The bony calvarium appears intact. Sinuses/Orbits: There is mucosal thickening in several ethmoid air cells. Other visualized paranasal sinuses are clear. Visualized orbits appear symmetric bilaterally. Other: Mastoid air cells are clear. IMPRESSION: Age related volume loss with mild periventricular small vessel disease. No acute infarct. No mass or hemorrhage. There are foci of arterial vascular calcification. There is mucosal thickening in several ethmoid air cells. Electronically Signed   By: Lowella Grip III M.D.   On: 08/08/2020 13:43   CT ANGIO CHEST PE W OR WO CONTRAST  Result Date: 08/08/2020 CLINICAL DATA:  Chest pain, shortness of breath, pleural effusion, rule out pulmonary embolism EXAM: CT ANGIOGRAPHY CHEST WITH CONTRAST TECHNIQUE: Multidetector CT imaging of the chest was performed using the standard protocol during bolus administration of intravenous contrast. Multiplanar CT image reconstructions and MIPs were obtained to evaluate the vascular anatomy. CONTRAST:  86mL OMNIPAQUE IOHEXOL 350 MG/ML SOLN COMPARISON:  06/12/2020 FINDINGS: Cardiovascular: Satisfactory opacification of the pulmonary arteries to the segmental level. No evidence of pulmonary embolism. Mild cardiomegaly. Trace pericardial effusion. Aortic atherosclerosis Mediastinum/Nodes: No enlarged mediastinal, hilar, or axillary lymph nodes. Thyroid gland, trachea, and esophagus demonstrate no significant findings. Lungs/Pleura: Moderate bilateral pleural effusions and associated atelectasis or consolidation.  Redemonstrated dense scarring of the left lung base. Upper Abdomen: No acute abnormality. Musculoskeletal: No chest wall abnormality. Redemonstrated high-grade wedge deformity of the T10 vertebral body. Review of the MIP images confirms the above findings. IMPRESSION: 1. Negative examination for pulmonary embolism. 2. Moderate bilateral pleural effusions and associated atelectasis or consolidation. 3. Aortic Atherosclerosis (ICD10-I70.0). Electronically Signed   By: Eddie Candle M.D.   On: 08/08/2020 14:13   DG Chest Port 1 View  Result Date: 08/10/2020 CLINICAL DATA:  Hypoxia EXAM: PORTABLE CHEST 1 VIEW COMPARISON:  Chest radiograph August 07, 2020 and chest CT August 08, 2020 FINDINGS: There are persistent pleural effusions bilaterally with atelectasis and ill-defined airspace opacity in the lung bases. There is also atelectatic change in the left mid lung region. There is mild interstitial edema. There is cardiomegaly with pulmonary venous hypertension. No adenopathy. There is aortic atherosclerosis. Monitor device over the left hemithorax anteriorly. T10 vertebral body fracture better seen on recent CT. IMPRESSION: Cardiomegaly with a degree of pulmonary vascular congestion. Pleural effusions with a degree of interstitial edema. Suspect a degree of underlying congestive heart failure. There is atelectatic change with patchy airspace opacity in the lung bases. There may be a degree of alveolar edema or possible pneumonia in the lung bases. Appearance similar to recent studies. Aortic Atherosclerosis (ICD10-I70.0). Electronically Signed   By: Lowella Grip III M.D.  On: 08/10/2020 08:13   DG Chest Port 1 View  Result Date: 08/07/2020 CLINICAL DATA:  Increased shortness of breath EXAM: PORTABLE CHEST 1 VIEW COMPARISON:  08/06/2020 FINDINGS: Interval increase in diffuse bilateral interstitial pulmonary opacity. Unchanged elevation of the left hemidiaphragm. Probable small layering bilateral pleural  effusions. Mild cardiomegaly. IMPRESSION: Interval increase in diffuse bilateral interstitial pulmonary opacity, which may reflect edema and/or infection. Probable small layering bilateral pleural effusions. Electronically Signed   By: Eddie Candle M.D.   On: 08/07/2020 10:15   DG Chest Port 1 View  Result Date: 08/06/2020 CLINICAL DATA:  Wheezing, anemia EXAM: PORTABLE CHEST 1 VIEW COMPARISON:  06/12/2020 chest radiograph. FINDINGS: Chronic prominent elevation of the left hemidiaphragm. Stable cardiomediastinal silhouette with mild cardiomegaly. No pneumothorax. Small bilateral pleural effusions are increased bilaterally. Patchy consolidation at both lung bases, worsened. IMPRESSION: 1. Chronic prominent left hemidiaphragm elevation. Worsened patchy consolidation at both lung bases, which could represent any combination of atelectasis, aspiration or pneumonia. 2. Mild cardiomegaly. Small bilateral pleural effusions are increased bilaterally. Electronically Signed   By: Ilona Sorrel M.D.   On: 08/06/2020 11:55   ECHOCARDIOGRAM COMPLETE  Result Date: 08/13/2020    ECHOCARDIOGRAM REPORT   Patient Name:   Jackie Turner Sill Date of Exam: 08/13/2020 Medical Rec #:  [id number]             Height:       59.0 in Accession #:    [id number]            Weight:       112.2 lb Date of Birth:  04-11-1936             BSA:          1.443 m Patient Age:    28 years              BP:           91/61 mmHg Patient Gender: F                     HR:           82 bpm. Exam Location:  [facility] Procedure: 2D Echo, Cardiac Doppler and Color Doppler Indications:     Chf- acute diastolic 638.46  History:         Patient has prior history of Echocardiogram examinations, most                  recent 06/13/2020. Risk Factors:Dyslipidemia.  Sonographer:     Sherrie Sport RDCS (AE) Referring Phys:  [id number] Floyce Stakes GRIFFITH Diagnosing Phys: Serafina Royals MD IMPRESSIONS  1. Left ventricular ejection fraction, by estimation, is 55 to 60%. The left  ventricle has normal function. The left ventricle has no regional wall motion abnormalities. Left ventricular diastolic parameters were normal.  2. Right ventricular systolic function is normal. The right ventricular size is normal. There is normal pulmonary artery systolic pressure.  3. Left atrial size was mild to moderately dilated.  4. Right atrial size was mild to moderately dilated.  5. The mitral valve is normal in structure. Moderate mitral valve regurgitation.  6. Tricuspid valve regurgitation is moderate.  7. The aortic valve is normal in structure. Aortic valve regurgitation is trivial. FINDINGS  Left Ventricle: Left ventricular ejection fraction, by estimation, is 55 to 60%. The left ventricle has normal function. The left ventricle has no regional wall motion abnormalities. The left ventricular internal cavity size was normal in size. There  is  no left ventricular hypertrophy. Left ventricular diastolic parameters were normal. Right Ventricle: The right ventricular size is normal. No increase in right ventricular wall thickness. Right ventricular systolic function is normal. There is normal pulmonary artery systolic pressure. The tricuspid regurgitant velocity is 2.31 m/s, and  with an assumed right atrial pressure of 10 mmHg, the estimated right ventricular systolic pressure is 25.9 mmHg. Left Atrium: Left atrial size was mild to moderately dilated. Right Atrium: Right atrial size was mild to moderately dilated. Pericardium: There is no evidence of pericardial effusion. Mitral Valve: The mitral valve is normal in structure. Moderate mitral valve regurgitation. Tricuspid Valve: The tricuspid valve is normal in structure. Tricuspid valve regurgitation is moderate. Aortic Valve: The aortic valve is normal in structure. Aortic valve regurgitation is trivial. Aortic valve mean gradient measures 3.5 mmHg. Aortic valve peak gradient measures 5.7 mmHg. Aortic valve area, by VTI measures 2.41 cm. Pulmonic  Valve: The pulmonic valve was normal in structure. Pulmonic valve regurgitation is not visualized. Aorta: The aortic root and ascending aorta are structurally normal, with no evidence of dilitation. IAS/Shunts: No atrial level shunt detected by color flow Doppler.  LEFT VENTRICLE PLAX 2D LVIDd:         4.09 cm LVIDs:         2.89 cm LV PW:         1.13 cm LV IVS:        1.36 cm LVOT diam:     2.00 cm LV SV:         50 LV SV Index:   35 LVOT Area:     3.14 cm  RIGHT VENTRICLE RV S prime:     9.03 cm/s TAPSE (M-mode): 2.7 cm LEFT ATRIUM           Index       RIGHT ATRIUM           Index LA diam:      4.50 cm 3.12 cm/m  RA Area:     17.40 cm LA Vol (A4C): 58.1 ml 40.27 ml/m RA Volume:   44.90 ml  31.12 ml/m  AORTIC VALVE                   PULMONIC VALVE AV Area (Vmax):    2.24 cm    PV Vmax:        0.49 m/s AV Area (Vmean):   2.28 cm    PV Peak grad:   1.0 mmHg AV Area (VTI):     2.41 cm    RVOT Peak grad: 2 mmHg AV Vmax:           119.00 cm/s AV Vmean:          82.200 cm/s AV VTI:            0.207 m AV Peak Grad:      5.7 mmHg AV Mean Grad:      3.5 mmHg LVOT Vmax:         85.00 cm/s LVOT Vmean:        59.700 cm/s LVOT VTI:          0.159 m LVOT/AV VTI ratio: 0.77  AORTA Ao Root diam: 2.60 cm MITRAL VALVE               TRICUSPID VALVE MV Area (PHT): 4.06 cm    TR Peak grad:   21.3 mmHg MV Decel Time: 187 msec    TR Vmax:  231.00 cm/s MV E velocity: 82.90 cm/s                            SHUNTS                            Systemic VTI:  0.16 m                            Systemic Diam: 2.00 cm Serafina Royals MD Electronically signed by Serafina Royals MD Signature Date/Time: 08/13/2020/2:06:31 PM    Final      Assessment and Recommendation  84 y.o. female with hypertension hyperlipidemia and apparent GI bleed with anemia and possible respiratory infection having acute onset paroxysmal nonvalvular atrial fibrillation with rapid ventricular rate exacerbated by above now much slower and more controlled with  low-dose metoprolol but continuing to be without evidence of current congestive heart failure or myocardial infarction now with possible bleeding complication from polyp which has been biopsied. 1.  Continue low-dose beta-blocker without change in medication management at this time.  Would send home on 25 mg of metoprolol twice per day 2.  Abstain from anticoagulation at this time due to concerns of bleeding complications for possible lower GI bleed and would abstain until seen in the outpatient setting where which may be reinstated at that time 3.  Continuation of low-dose of furosemide for any previous lower extremity edema pulmonary edema in the past which appears to that she is tolerating well.  Would consider continuation at same oral dose when discharged home 4.  Continue supportive care of pulmonary infection 5.  No further cardiac diagnostics necessary at this time 6.  Okay for discharge home from cardiac standpoint with no further adjustments to her medication management as listed per above Signed, Serafina Royals M.D. FACC

## 2020-08-15 NOTE — Progress Notes (Signed)
MEWS is still yellow after interventions. Pt VS have improved and lab results for CBG 99 and Hgb is 9.8. Pt is asymptomatic at this time. Will continue to monitor pt.

## 2020-08-15 NOTE — Progress Notes (Signed)
Jackie Lame, MD [facility]   [address] [place], [place] [address] Phone: [phone] Fax : [phone]   Subjective: The patient had a quite tortuous colon with difficulty getting to the cecum and a large polyp involving the proximal ascending colon near the ileocecal valve. Multiple biopsies were taken. The lesion was not removed due to the size and location.   Objective: Vital signs in last 24 hours: Vitals:   08/15/20 0800 08/15/20 0900 08/15/20 0955 08/15/20 1200  BP:   107/63 (!) 98/56  Pulse:      Resp: 18 20 18 19   Temp:   97.7 F (36.5 C) 97.8 F (36.6 C)  TempSrc:   Oral Oral  SpO2:   98% 98%  Weight:      Height:       Weight change:   Intake/Output Summary (Last 24 hours) at 08/15/2020 1456 Last data filed at 08/15/2020 1345 Gross per 24 hour  Intake 480 ml  Output --  Net 480 ml     Exam: General: The patient is sitting up in the chair in no apparent distress resting comfortably.    Lab Results: @LABTEST2 @ Micro Results: No results found for this or any previous visit (from the past 240 hour(s)). Studies/Results: No results found. Medications: I have reviewed the patient's current medications. Scheduled Meds: . Chlorhexidine Gluconate Cloth  6 each Topical Daily  . doxepin  30 mg Oral QHS  . fluticasone furoate-vilanterol  1 puff Inhalation Daily   And  . umeclidinium bromide  1 puff Inhalation Daily  . furosemide  40 mg Oral Daily  . ipratropium-albuterol  3 mL Nebulization Once  . ipratropium-albuterol  3 mL Nebulization TID  . lidocaine  1 patch Transdermal Q24H  . mouth rinse  15 mL Mouth Rinse BID  . metoprolol tartrate  25 mg Oral BID  . oxybutynin  15 mg Oral QHS  . pantoprazole  40 mg Oral QAC breakfast  . pentosan polysulfate  100 mg Oral BID  . vitamin B-12  1,000 mcg Oral Daily   Continuous Infusions: . sodium chloride 500 mL (08/10/20 1012)  . sodium chloride 800 mL/hr at 08/14/20 1336   PRN Meds:.sodium chloride,  acetaminophen **OR** acetaminophen, albuterol, LORazepam, morphine injection, ondansetron **OR** ondansetron (ZOFRAN) IV, sodium chloride, traMADol, traZODone   Assessment: Principal Problem:   GI bleeding Active Problems:   IBS (irritable bowel syndrome)   Atrial fibrillation with RVR (HCC)   Acute on chronic respiratory failure with hypoxia and hypercapnia (HCC)   COPD (chronic obstructive pulmonary disease) (HCC)   Acute blood loss anemia   Polyp of colon    Plan: The patient reports that she has had parents that lived to 30 and has been told about the size of her adenoma and possible need for surgery. The biopsies are not back yet. The patient does not need to stay in the hospital because of this polyp and should consider follow-up with a surgeon as an outpatient to see if she would like to proceed with surgery to remove this lesion. Nothing further to do from a GI point of view at this time.  I will sign off.  Please call if any further GI concerns or questions.  We would like to thank you for the opportunity to participate in the care of Jackie Turner.     LOS: 11 days   Lewayne Bunting 08/15/2020, 2:56 PM Pager [phone] 7am-5pm  Check AMION for 5pm -7am coverage and on weekends

## 2020-08-15 NOTE — Discharge Instructions (Signed)
BLOOD PRESSURE & HEART RATE - please check your BP and HR before taking your metoprolol twice daily.  You should HOLD that dose if your top BP number is less than 100, or bottom BP number less than 55.    Please write down your blood pressures and heart rates, bring this to your follow up appointments.  Your heart rate sometimes goes fast with A-fib.  As long as it is not staying fast, above 110 beats/min, that's okay.  If you see your HR staying above 110 despite sitting still and resting, you should call cardiology or your primary care doctor.    BLEEDING - you have not had any more bleeding since we stopped your Eliquis.  Do not take your Eliquis any more.  At some point in follow up, need to have a discussion of risks and benefits of taking Eliquis.  It is possible you could tolerate lower dose without bleeding, but this should be discussed with your cardiologist and primary care doctors.  OXYGEN - your oxygen level should be at 90% or higher.  If you need to use more than 2 L/min of oxygen to keep the level above 90, please call your doctor to let them know.

## 2020-08-15 NOTE — Progress Notes (Signed)
Stachia Kathlee Nations to be D/C'd Home per MD order.  Discussed prescriptions and follow up appointments with the patient. Prescriptions were e-prescribed, medication list explained in detail. Pt verbalized understanding.  Allergies as of 08/15/2020      Reactions   Nsaids Other (See Comments)   History of gastric ulcer   Amoxicillin Nausea Only, Rash   Ibandronic Acid Other (See Comments)   Muscle weakness - advised not to take **BONIVA** - drug name   Actonel  [risedronate] Other (See Comments)   Gastric ulcers   Antihistamines, Chlorpheniramine-type    Aspirin    Gastric ulcer   Buspar [buspirone] Other (See Comments)   Pt does not remember reaction    Butalbital-aspirin-caffeine Other (See Comments)   Other reaction(s): Unknown   Clarithromycin Other (See Comments)   Bloating *BIAXIN*   Codeine    Gatifloxacin Other (See Comments)   Hydrocodone-guaifenesin Nausea Only   CODICLEAR DH STYRUP    Moxifloxacin    Oxycodone Other (See Comments)   Dizziness   Penicillins Other (See Comments)   Risedronate Sodium    Gastric ulcer   Tylenol With Codeine #3  [acetaminophen-codeine]    Other reaction(s): Unknown   Raloxifene Other (See Comments)   Bloating Bloating *EVISTA*      Medication List    STOP taking these medications   apixaban 2.5 MG Tabs tablet Commonly known as: ELIQUIS   Cardizem CD 120 MG 24 hr capsule Generic drug: diltiazem     TAKE these medications   Caltrate 600 1500 (600 Ca) MG Tabs tablet Generic drug: calcium carbonate Take 1 tablet by mouth 2 (two) times daily with a meal.   Clobetasol Propionate Emulsion 0.05 % topical foam Apply 1 application topically 2 (two) times daily.   cyanocobalamin 1000 MCG tablet Take 1 tablet (1,000 mcg total) by mouth daily. Start taking on: August 16, 2020   diclofenac Sodium 1 % Gel Commonly known as: VOLTAREN Apply 4 g topically 4 (four) times daily.   dicyclomine 10 MG capsule Commonly known as:  BENTYL Take 1 capsule (10 mg total) by mouth 3 (three) times daily as needed for spasms.   doxepin 10 MG capsule Commonly known as: SINEQUAN Take 3 capsules (30 mg total) by mouth at bedtime.   feeding supplement (ENSURE ENLIVE) Liqd Take 237 mLs by mouth 2 (two) times daily between meals.   fluticasone 50 MCG/ACT nasal spray Commonly known as: FLONASE Place 1-2 sprays into both nostrils daily.   furosemide 40 MG tablet Commonly known as: LASIX Take 1 tablet (40 mg total) by mouth daily. Start taking on: August 16, 2020   metoprolol tartrate 25 MG tablet Commonly known as: LOPRESSOR Take 1 tablet (25 mg total) by mouth 2 (two) times daily.   nystatin 100000 UNIT/ML suspension Commonly known as: MYCOSTATIN TAKE 5 MLS BY MOUTH 4 TIMES A DAY. What changed: See the new instructions.   oxybutynin 15 MG 24 hr tablet Commonly known as: DITROPAN XL Take 1 tablet (15 mg total) by mouth at bedtime.   pentosan polysulfate 100 MG capsule Commonly known as: ELMIRON Take 1 capsule (100 mg total) by mouth in the morning and at bedtime. What changed:   when to take this  reasons to take this   Trelegy Ellipta 100-62.5-25 MCG/INH Aepb Generic drug: Fluticasone-Umeclidin-Vilant Inhale 1 puff into the lungs daily.   Ultram 50 MG tablet Generic drug: traMADol Take 1 tablet (50 mg total) by mouth every 6 (six) hours as needed. What  changed: reasons to take this   Urea 40 % Lotn Apply 1 application topically daily as needed (dry skin).   Ventolin HFA 108 (90 Base) MCG/ACT inhaler Generic drug: albuterol INHALE TWO PUFFS INTO THE LUNGS EVERY 4 HOURS AS NEEDED FOR WHEEZING OR SHORTNESS OF BREATH What changed:   how much to take  how to take this  when to take this  reasons to take this  additional instructions            Durable Medical Equipment  (From admission, onward)         Start     Ordered   08/14/20 1506  For home use only DME 3 n 1  Once         08/14/20 1505          Vitals:   08/15/20 0955 08/15/20 1200  BP: 107/63 (!) 98/56  Pulse:    Resp: 18 19  Temp: 97.7 F (36.5 C) 97.8 F (36.6 C)  SpO2: 98% 98%    Tele box removed and returned. Skin clean, dry and intact without evidence of skin break down, no evidence of skin tears noted. IV catheter discontinued intact. Site without signs and symptoms of complications. Dressing and pressure applied. Pt denies pain at this time. No complaints noted.  An After Visit Summary was printed and given to the patient. Patient escorted via [place], and D/C home via private auto.  Rolley Sims

## 2020-08-15 NOTE — Discharge Summary (Signed)
Physician Discharge Summary  Jackie Turner IRW:[id number] DOB: June 03, 1936 DOA: 08/04/2020  PCP: Mar Daring, PA-C  Admit date: 08/04/2020 Discharge date: 08/15/2020  Admitted From: home Disposition:  home  Recommendations for Outpatient Follow-up:  1. Follow up with PCP in 1-2 weeks 2. Please obtain BMP/CBC in one week 3. Please follow up with cardiology in 1-2 weeks 4. Please follow up with GI in 2-3 weeks 5. Follow up results of colon polyp biopsy / pathology 6. Patient's Eliquis was stopped due to bleeding and not resumed at discharge.  Consider risk/benefit of Eliquis for stroke prevention with A-fib in follow up.  Consider trial of low dose.  Home Health: PT, OT, RN  Equipment/Devices: 3 n 1   Discharge Condition: Stable  CODE STATUS: Full  Diet recommendation: Heart Healthy    Discharge Diagnoses: Principal Problem:   GI bleeding Active Problems:   Atrial fibrillation with RVR (HCC)   Acute on chronic respiratory failure with hypoxia and hypercapnia (HCC)   Acute blood loss anemia   COPD (chronic obstructive pulmonary disease) (HCC)   IBS (irritable bowel syndrome)   Polyp of colon    Summary of HPI and Hospital Course:  Jackie Turner  is a 84 y.o. female with a history of asthma, dyslipidemia, atrial fibrillation on Eliquis and osteoporosis, who presented to the ED on  08/04/20 with recurrent dark stools, having recently been started on Eliquis for A-fib in early July.    In the ED, temperature was 100.2, RR 25, BP 145/87 with HR 79, and O2 sat  90's% on 4 L nasal cannula oxygen.  CMP was unremarkable aside from glucose of 172.  CBC showed Hbg 6.3, Hct 21.5 (down from 11.4 and 35.8 on 06/22/2020). EKG showed A-fib with RVR at 104 bpm.  Treated with IV Protonix in the ED and admitted to hospitalist service with GI consulted for evaluation of GI bleeding.   From GI standpoint, endoscopic evaluation was delayed by patient's respiratory issues.  Her bleeding  stopped once Eliquis was held, has not recurred since admission.  Hemoglobin relative stable.  No bleeding source was found on EGD.  Colonoscopy planned for 8/30 (she was unable to drink GoLytely, so alternate prep being given and procedure tmrw).   From cardiac and respiratory standpoint, she clinically improved with IV diuresis and has been transitioned to oral Lasix.  She was treated for possible pneumonia with 4 days of IV antibiotics.  Imaging and history reviewed, since patient remained afebrile without respiratory complaints after diuresed, antibiotics were stopped.  Clinically remains stable and improved, on her baseline home O2.  Patient did require Bipap due to hypercapnia with pCO2 in 70's and concurrent confusion and agitation.  This has also resolved.       Acute on chronic respiratory failure with hypoxia, hypercapnia cannot be specified - secondary to COPD, CHF and possibly PNA given consolidation on imaging.  Did require Bipap, weaned off.  Maintained on supplemental O2 to keep sat > 90%.  Has been on home O2 since admission in July, uses 2 L/min at baseline.  Acute on Chronic Diastolic CHF - last echo June 2021 showed EF 60-65% with grade 1 diastolic dysfunction.  Monitor I/O's and daily weights.  Cardiology following, recommends low dose PO Lasix daily.    Atrial fibrillation with RVR - most likely due to anemia. CHA2DS2-VASc score is 4.   Rate control initially with Cardizem infusion.   Rate control somewhat challenging with patient having some exertional shortness fo  breath.   Eliquis held due to GI bleed concerns.   Cardiology consulted.   Oral Cardizem was stopped.   Started on low dose metoprolol with good rate control, some HR elevation on exertion that would resolve with rest.   Beta blocker was unable to be further increased due to soft BP's.   Recommend maintain K>4.0 and Mg >2.0. Follow up labs in 1-2 weeks.  Hypernatremia -not present on admission, likely due to  diuresis and poor intake of water.  Resolved.  New on 8/26 Na 146 >> 149 >> 142 >> 139.   Deferred use of D5W given need for diuresis.  Patient was told to liberalize water intake and this resolved.  BMP in follow up.  Anion gap metabolic acidosis -not present on admission.  Resolved.  Likely due to diuresis.    Community-Acquired Pneumonia RULED OUT- .  Had been started on Rocephin and Flagyl empirically due to possible consolidation on CTA chest which was more likely atelectasis due to pleural effusions.  Respiratory status improved with diuresis, unlikely was infection. Received 4 days antibiotics, remained afebrile, no leukocytosis, and procal < 0.10, so abx were d/c'd.  Remains clinically improved and stable.    GI bleeding with Acute Blood Loss Anemia - POA. Anemia improving and no further bleeding seen.  Has history of PUD with bleeding many years ago.  Hold Eliquis.   GI was consulted. Trended H&H's.  Received 2 units RBC's transfused. Treated with IV Protonix empirically.   Endoscopic evaluations were held until cardiac and respiratory issues improved.  EGD on 8/28 was fairly unremarkable and no source of bleeding was seen.   Colonoscopy 8/30 showed a 15 mm sessile polyp at the ileocecal valve, biopsied (unable to be excised due to size and location).   Follow up on pathology which is pending. Follow up with surgery for consideration of surgical resection.  Hypokalemia - resolved. Due to diuresis most likely.  Replaced.  Monitor and replace as needed.   COPD - not acutely exacerbated.  Continued on bronchodilators.  IBS with constipation - chronic, patient says has not had any constipation since starting Eliquis in July.  Monitor.    Discharge Instructions   Discharge Instructions    (HEART FAILURE PATIENTS) Call MD:  Anytime you have any of the following symptoms: 1) 3 pound weight gain in 24 hours or 5 pounds in 1 week 2) shortness of breath, with or without a dry  hacking cough 3) swelling in the hands, feet or stomach 4) if you have to sleep on extra pillows at night in order to breathe.   Complete by: As directed    Call MD for:   Complete by: As directed    Worsening shortness of breath, swelling, heart racing if not slowing down when you rest (if heart rate stays above 110 beats/min) despite resting. Any bloody or dark/tarry looking stools.   Call MD for:  extreme fatigue   Complete by: As directed    Call MD for:  persistant dizziness or light-headedness   Complete by: As directed    Diet - low sodium heart healthy   Complete by: As directed    Increase activity slowly   Complete by: As directed      Allergies as of 08/15/2020      Reactions   Nsaids Other (See Comments)   History of gastric ulcer   Amoxicillin Nausea Only, Rash   Ibandronic Acid Other (See Comments)   Muscle weakness - advised  not to take **BONIVA** - drug name   Actonel  [risedronate] Other (See Comments)   Gastric ulcers   Antihistamines, Chlorpheniramine-type    Aspirin    Gastric ulcer   Buspar [buspirone] Other (See Comments)   Pt does not remember reaction    Butalbital-aspirin-caffeine Other (See Comments)   Other reaction(s): Unknown   Clarithromycin Other (See Comments)   Bloating *BIAXIN*   Codeine    Gatifloxacin Other (See Comments)   Hydrocodone-guaifenesin Nausea Only   CODICLEAR DH STYRUP    Moxifloxacin    Oxycodone Other (See Comments)   Dizziness   Penicillins Other (See Comments)   Risedronate Sodium    Gastric ulcer   Tylenol With Codeine #3  [acetaminophen-codeine]    Other reaction(s): Unknown   Raloxifene Other (See Comments)   Bloating Bloating *EVISTA*      Medication List    STOP taking these medications   apixaban 2.5 MG Tabs tablet Commonly known as: ELIQUIS   Cardizem CD 120 MG 24 hr capsule Generic drug: diltiazem     TAKE these medications   Caltrate 600 1500 (600 Ca) MG Tabs tablet Generic drug: calcium  carbonate Take 1 tablet by mouth 2 (two) times daily with a meal.   Clobetasol Propionate Emulsion 0.05 % topical foam Apply 1 application topically 2 (two) times daily.   cyanocobalamin 1000 MCG tablet Take 1 tablet (1,000 mcg total) by mouth daily. Start taking on: August 16, 2020   diclofenac Sodium 1 % Gel Commonly known as: VOLTAREN Apply 4 g topically 4 (four) times daily.   dicyclomine 10 MG capsule Commonly known as: BENTYL Take 1 capsule (10 mg total) by mouth 3 (three) times daily as needed for spasms.   doxepin 10 MG capsule Commonly known as: SINEQUAN Take 3 capsules (30 mg total) by mouth at bedtime.   feeding supplement (ENSURE ENLIVE) Liqd Take 237 mLs by mouth 2 (two) times daily between meals.   fluticasone 50 MCG/ACT nasal spray Commonly known as: FLONASE Place 1-2 sprays into both nostrils daily.   furosemide 40 MG tablet Commonly known as: LASIX Take 1 tablet (40 mg total) by mouth daily. Start taking on: August 16, 2020   metoprolol tartrate 25 MG tablet Commonly known as: LOPRESSOR Take 1 tablet (25 mg total) by mouth 2 (two) times daily.   nystatin 100000 UNIT/ML suspension Commonly known as: MYCOSTATIN TAKE 5 MLS BY MOUTH 4 TIMES A DAY. What changed: See the new instructions.   oxybutynin 15 MG 24 hr tablet Commonly known as: DITROPAN XL Take 1 tablet (15 mg total) by mouth at bedtime.   pentosan polysulfate 100 MG capsule Commonly known as: ELMIRON Take 1 capsule (100 mg total) by mouth in the morning and at bedtime. What changed:   when to take this  reasons to take this   Trelegy Ellipta 100-62.5-25 MCG/INH Aepb Generic drug: Fluticasone-Umeclidin-Vilant Inhale 1 puff into the lungs daily.   Ultram 50 MG tablet Generic drug: traMADol Take 1 tablet (50 mg total) by mouth every 6 (six) hours as needed. What changed: reasons to take this   Urea 40 % Lotn Apply 1 application topically daily as needed (dry skin).   Ventolin  HFA 108 (90 Base) MCG/ACT inhaler Generic drug: albuterol INHALE TWO PUFFS INTO THE LUNGS EVERY 4 HOURS AS NEEDED FOR WHEEZING OR SHORTNESS OF BREATH What changed:   how much to take  how to take this  when to take this  reasons to take this  additional instructions            Durable Medical Equipment  (From admission, onward)         Start     Ordered   08/14/20 1506  For home use only DME 3 n 1  Once        08/14/20 1505          Follow-up Information    Home, [facility] Follow up.   Specialty: Home Health Services Why: Physical Therapy, Occupational Therapy, Nurse Contact information: [address] [place] [address] [phone]        Isaias Cowman, MD Follow up in 1 week(s).   Specialty: Cardiology Contact information: [facility] [place] [address] [phone]              Allergies  Allergen Reactions  . Nsaids Other (See Comments)    History of gastric ulcer  . Amoxicillin Nausea Only and Rash  . Ibandronic Acid Other (See Comments)    Muscle weakness - advised not to take  **BONIVA** - drug name  . Actonel  [Risedronate] Other (See Comments)    Gastric ulcers  . Antihistamines, Chlorpheniramine-Type   . Aspirin     Gastric ulcer  . Buspar [Buspirone] Other (See Comments)    Pt does not remember reaction   . Butalbital-Aspirin-Caffeine Other (See Comments)    Other reaction(s): Unknown  . Clarithromycin Other (See Comments)    Bloating  *BIAXIN*  . Codeine   . Gatifloxacin Other (See Comments)  . Hydrocodone-Guaifenesin Nausea Only    CODICLEAR DH STYRUP   . Moxifloxacin   . Oxycodone Other (See Comments)    Dizziness  . Penicillins Other (See Comments)  . Risedronate Sodium     Gastric ulcer  . Tylenol With Codeine #3  [Acetaminophen-Codeine]     Other reaction(s): Unknown  . Raloxifene Other (See Comments)    Bloating Bloating  *EVISTA*     Consultations:  Gastroenterology  Cardiology    Procedures/Studies: CT HEAD WO CONTRAST  Result Date: 08/08/2020 CLINICAL DATA:  Delirium with altered mental status EXAM: CT HEAD WITHOUT CONTRAST TECHNIQUE: Contiguous axial images were obtained from the base of the skull through the vertex without intravenous contrast. COMPARISON:  None. FINDINGS: Brain: There is age related volume loss. There is no intracranial mass, hemorrhage, extra-axial fluid collection, or midline shift. There is slight small vessel disease in the centra semiovale bilaterally. No acute infarct is evident. Vascular: No hyperdense vessel. There is calcification in each carotid siphon region. Skull: The bony calvarium appears intact. Sinuses/Orbits: There is mucosal thickening in several ethmoid air cells. Other visualized paranasal sinuses are clear. Visualized orbits appear symmetric bilaterally. Other: Mastoid air cells are clear. IMPRESSION: Age related volume loss with mild periventricular small vessel disease. No acute infarct. No mass or hemorrhage. There are foci of arterial vascular calcification. There is mucosal thickening in several ethmoid air cells. Electronically Signed   By: Lowella Grip III M.D.   On: 08/08/2020 13:43   CT ANGIO CHEST PE W OR WO CONTRAST  Result Date: 08/08/2020 CLINICAL DATA:  Chest pain, shortness of breath, pleural effusion, rule out pulmonary embolism EXAM: CT ANGIOGRAPHY CHEST WITH CONTRAST TECHNIQUE: Multidetector CT imaging of the chest was performed using the standard protocol during bolus administration of intravenous contrast. Multiplanar CT image reconstructions and MIPs were obtained to evaluate the vascular anatomy. CONTRAST:  10mL OMNIPAQUE IOHEXOL 350 MG/ML SOLN COMPARISON:  06/12/2020 FINDINGS: Cardiovascular: Satisfactory opacification of the pulmonary arteries to the segmental level. No evidence of pulmonary embolism. Mild cardiomegaly. Trace pericardial effusion. Aortic  atherosclerosis Mediastinum/Nodes: No enlarged mediastinal, hilar, or axillary lymph nodes. Thyroid gland, trachea, and esophagus demonstrate no significant findings. Lungs/Pleura: Moderate bilateral pleural effusions and associated atelectasis or consolidation. Redemonstrated dense scarring of the left lung base. Upper Abdomen: No acute abnormality. Musculoskeletal: No chest wall abnormality. Redemonstrated high-grade wedge deformity of the T10 vertebral body. Review of the MIP images confirms the above findings. IMPRESSION: 1. Negative examination for pulmonary embolism. 2. Moderate bilateral pleural effusions and associated atelectasis or consolidation. 3. Aortic Atherosclerosis (ICD10-I70.0). Electronically Signed   By: Eddie Candle M.D.   On: 08/08/2020 14:13   DG Chest Port 1 View  Result Date: 08/10/2020 CLINICAL DATA:  Hypoxia EXAM: PORTABLE CHEST 1 VIEW COMPARISON:  Chest radiograph August 07, 2020 and chest CT August 08, 2020 FINDINGS: There are persistent pleural effusions bilaterally with atelectasis and ill-defined airspace opacity in the lung bases. There is also atelectatic change in the left mid lung region. There is mild interstitial edema. There is cardiomegaly with pulmonary venous hypertension. No adenopathy. There is aortic atherosclerosis. Monitor device over the left hemithorax anteriorly. T10 vertebral body fracture better seen on recent CT. IMPRESSION: Cardiomegaly with a degree of pulmonary vascular congestion. Pleural effusions with a degree of interstitial edema. Suspect a degree of underlying congestive heart failure. There is atelectatic change with patchy airspace opacity in the lung bases. There may be a degree of alveolar edema or possible pneumonia in the lung bases. Appearance similar to recent studies. Aortic Atherosclerosis (ICD10-I70.0). Electronically Signed   By: Lowella Grip III M.D.   On: 08/10/2020 08:13   DG Chest Port 1 View  Result Date: 08/07/2020 CLINICAL  DATA:  Increased shortness of breath EXAM: PORTABLE CHEST 1 VIEW COMPARISON:  08/06/2020 FINDINGS: Interval increase in diffuse bilateral interstitial pulmonary opacity. Unchanged elevation of the left hemidiaphragm. Probable small layering bilateral pleural effusions. Mild cardiomegaly. IMPRESSION: Interval increase in diffuse bilateral interstitial pulmonary opacity, which may reflect edema and/or infection. Probable small layering bilateral pleural effusions. Electronically Signed   By: Eddie Candle M.D.   On: 08/07/2020 10:15   DG Chest Port 1 View  Result Date: 08/06/2020 CLINICAL DATA:  Wheezing, anemia EXAM: PORTABLE CHEST 1 VIEW COMPARISON:  06/12/2020 chest radiograph. FINDINGS: Chronic prominent elevation of the left hemidiaphragm. Stable cardiomediastinal silhouette with mild cardiomegaly. No pneumothorax. Small bilateral pleural effusions are increased bilaterally. Patchy consolidation at both lung bases, worsened. IMPRESSION: 1. Chronic prominent left hemidiaphragm elevation. Worsened patchy consolidation at both lung bases, which could represent any combination of atelectasis, aspiration or pneumonia. 2. Mild cardiomegaly. Small bilateral pleural effusions are increased bilaterally. Electronically Signed   By: Ilona Sorrel M.D.   On: 08/06/2020 11:55   ECHOCARDIOGRAM COMPLETE  Result Date: 08/13/2020    ECHOCARDIOGRAM REPORT   Patient Name:   Jackie Turner Date of Exam: 08/13/2020 Medical Rec #:  [id number]             Height:       59.0 in Accession #:    [id number]            Weight:       112.2 lb Date of Birth:  1935-12-20             BSA:          1.443 m Patient Age:    24 years  BP:           91/61 mmHg Patient Gender: F                     HR:           82 bpm. Exam Location:  [facility] Procedure: 2D Echo, Cardiac Doppler and Color Doppler Indications:     Chf- acute diastolic 798.92  History:         Patient has prior history of Echocardiogram examinations, most                   recent 06/13/2020. Risk Factors:Dyslipidemia.  Sonographer:     Sherrie Sport RDCS (AE) Referring Phys:  [id number] Floyce Stakes Rylen Swindler Diagnosing Phys: Serafina Royals MD IMPRESSIONS  1. Left ventricular ejection fraction, by estimation, is 55 to 60%. The left ventricle has normal function. The left ventricle has no regional wall motion abnormalities. Left ventricular diastolic parameters were normal.  2. Right ventricular systolic function is normal. The right ventricular size is normal. There is normal pulmonary artery systolic pressure.  3. Left atrial size was mild to moderately dilated.  4. Right atrial size was mild to moderately dilated.  5. The mitral valve is normal in structure. Moderate mitral valve regurgitation.  6. Tricuspid valve regurgitation is moderate.  7. The aortic valve is normal in structure. Aortic valve regurgitation is trivial. FINDINGS  Left Ventricle: Left ventricular ejection fraction, by estimation, is 55 to 60%. The left ventricle has normal function. The left ventricle has no regional wall motion abnormalities. The left ventricular internal cavity size was normal in size. There is  no left ventricular hypertrophy. Left ventricular diastolic parameters were normal. Right Ventricle: The right ventricular size is normal. No increase in right ventricular wall thickness. Right ventricular systolic function is normal. There is normal pulmonary artery systolic pressure. The tricuspid regurgitant velocity is 2.31 m/s, and  with an assumed right atrial pressure of 10 mmHg, the estimated right ventricular systolic pressure is 08.1 mmHg. Left Atrium: Left atrial size was mild to moderately dilated. Right Atrium: Right atrial size was mild to moderately dilated. Pericardium: There is no evidence of pericardial effusion. Mitral Valve: The mitral valve is normal in structure. Moderate mitral valve regurgitation. Tricuspid Valve: The tricuspid valve is normal in structure. Tricuspid valve regurgitation  is moderate. Aortic Valve: The aortic valve is normal in structure. Aortic valve regurgitation is trivial. Aortic valve mean gradient measures 3.5 mmHg. Aortic valve peak gradient measures 5.7 mmHg. Aortic valve area, by VTI measures 2.41 cm. Pulmonic Valve: The pulmonic valve was normal in structure. Pulmonic valve regurgitation is not visualized. Aorta: The aortic root and ascending aorta are structurally normal, with no evidence of dilitation. IAS/Shunts: No atrial level shunt detected by color flow Doppler.  LEFT VENTRICLE PLAX 2D LVIDd:         4.09 cm LVIDs:         2.89 cm LV PW:         1.13 cm LV IVS:        1.36 cm LVOT diam:     2.00 cm LV SV:         50 LV SV Index:   35 LVOT Area:     3.14 cm  RIGHT VENTRICLE RV S prime:     9.03 cm/s TAPSE (M-mode): 2.7 cm LEFT ATRIUM           Index       RIGHT ATRIUM  Index LA diam:      4.50 cm 3.12 cm/m  RA Area:     17.40 cm LA Vol (A4C): 58.1 ml 40.27 ml/m RA Volume:   44.90 ml  31.12 ml/m  AORTIC VALVE                   PULMONIC VALVE AV Area (Vmax):    2.24 cm    PV Vmax:        0.49 m/s AV Area (Vmean):   2.28 cm    PV Peak grad:   1.0 mmHg AV Area (VTI):     2.41 cm    RVOT Peak grad: 2 mmHg AV Vmax:           119.00 cm/s AV Vmean:          82.200 cm/s AV VTI:            0.207 m AV Peak Grad:      5.7 mmHg AV Mean Grad:      3.5 mmHg LVOT Vmax:         85.00 cm/s LVOT Vmean:        59.700 cm/s LVOT VTI:          0.159 m LVOT/AV VTI ratio: 0.77  AORTA Ao Root diam: 2.60 cm MITRAL VALVE               TRICUSPID VALVE MV Area (PHT): 4.06 cm    TR Peak grad:   21.3 mmHg MV Decel Time: 187 msec    TR Vmax:        231.00 cm/s MV E velocity: 82.90 cm/s                            SHUNTS                            Systemic VTI:  0.16 m                            Systemic Diam: 2.00 cm Serafina Royals MD Electronically signed by Serafina Royals MD Signature Date/Time: 08/13/2020/2:06:31 PM    Final        Subjective: Patient seen up in chair this  AM.  Reports feeling well.  Denies CP, SOB, palpitations.  Says hopeful to go home later.  Tolerated alternate bowel prep for colonoscopy today.     Discharge Exam: Vitals:   08/15/20 0955 08/15/20 1200  BP: 107/63 (!) 98/56  Pulse:    Resp: 18 19  Temp: 97.7 F (36.5 C) 97.8 F (36.6 C)  SpO2: 98% 98%   Vitals:   08/15/20 0800 08/15/20 0900 08/15/20 0955 08/15/20 1200  BP:   107/63 (!) 98/56  Pulse:      Resp: 18 20 18 19   Temp:   97.7 F (36.5 C) 97.8 F (36.6 C)  TempSrc:   Oral Oral  SpO2:   98% 98%  Weight:      Height:        General: Pt is alert, awake, not in acute distress Cardiovascular: RRR, S1/S2 +, no rubs, no gallops Respiratory: CTA bilaterally mildly diminished bases, no wheezing, no rhonchi Abdominal: Soft, NT, ND, bowel sounds + Extremities: no edema, no cyanosis    The results of significant diagnostics from this hospitalization (including imaging, microbiology, ancillary and laboratory) are listed below  for reference.     Microbiology: No results found for this or any previous visit (from the past 240 hour(s)).   Labs: BNP (last 3 results) Recent Labs    06/14/20 1838 08/07/20 0543 08/10/20 0522  BNP 404.7* 232.1* 063.0*   Basic Metabolic Panel: Recent Labs  Lab 08/09/20 1601 08/10/20 0522 08/11/20 0626 08/12/20 0932 08/13/20 0445 08/14/20 0518 08/15/20 0417  NA 144   < > 149* 142 139 140 141  K 3.1*   < > 3.5 3.5 4.4 3.7 4.0  CL 90*   < > 95* 95* 96* 99 98  CO2 38*   < > 37* 37* 35* 32 34*  GLUCOSE 102*   < > 121* 119* 105* 90 107*  BUN 19   < > 33* 28* 22 12 16   CREATININE 0.83   < > 0.84 0.73 0.77 0.70 0.70  CALCIUM 8.8*   < > 9.2 8.6* 9.1 8.9 8.7*  MG 1.8  --  2.2  --  2.4 2.1 2.0   < > = values in this interval not displayed.   Liver Function Tests: Recent Labs  Lab 08/09/20 0816 08/10/20 0522 08/11/20 0626  AST 18 18 23   ALT 14 13 14   ALKPHOS 62 59 58  BILITOT 1.8* 1.5* 0.9  PROT 5.8* 6.3* 6.2*  ALBUMIN 3.4*  3.6 3.6   No results for input(s): LIPASE, AMYLASE in the last 168 hours. No results for input(s): AMMONIA in the last 168 hours. CBC: Recent Labs  Lab 08/11/20 0626 08/11/20 0626 08/12/20 0613 08/13/20 0445 08/14/20 0518 08/14/20 2341 08/15/20 0417  WBC 6.9  --  5.3 5.8 5.4  --  7.8  HGB 11.1*   < > 9.4* 9.5* 9.7* 9.8* 9.1*  HCT 36.5   < > 32.3* 33.8* 33.9* 32.9* 31.4*  MCV 78.2*  --  80.8 82.0 81.5  --  80.3  PLT 308  --  258 277 274  --  271   < > = values in this interval not displayed.   Cardiac Enzymes: No results for input(s): CKTOTAL, CKMB, CKMBINDEX, TROPONINI in the last 168 hours. BNP: Invalid input(s): POCBNP CBG: Recent Labs  Lab 08/14/20 2345  GLUCAP 99   D-Dimer No results for input(s): DDIMER in the last 72 hours. Hgb A1c No results for input(s): HGBA1C in the last 72 hours. Lipid Profile No results for input(s): CHOL, HDL, LDLCALC, TRIG, CHOLHDL, LDLDIRECT in the last 72 hours. Thyroid function studies No results for input(s): TSH, T4TOTAL, T3FREE, THYROIDAB in the last 72 hours.  Invalid input(s): FREET3 Anemia work up No results for input(s): VITAMINB12, FOLATE, FERRITIN, TIBC, IRON, RETICCTPCT in the last 72 hours. Urinalysis    Component Value Date/Time   COLORURINE YELLOW (A) 04/16/2017 1533   APPEARANCEUR HAZY (A) 04/16/2017 1533   LABSPEC 1.026 04/16/2017 1533   PHURINE 5.0 04/16/2017 1533   GLUCOSEU NEGATIVE 04/16/2017 1533   HGBUR NEGATIVE 04/16/2017 1533   BILIRUBINUR negative 12/31/2017 1646   KETONESUR NEGATIVE 04/16/2017 1533   PROTEINUR negative 12/31/2017 1646   PROTEINUR NEGATIVE 04/16/2017 1533   UROBILINOGEN 0.2 12/31/2017 1646   NITRITE negative 12/31/2017 1646   NITRITE NEGATIVE 04/16/2017 1533   LEUKOCYTESUR Negative 12/31/2017 1646   Sepsis Labs Invalid input(s): PROCALCITONIN,  WBC,  LACTICIDVEN Microbiology No results found for this or any previous visit (from the past 240 hour(s)).   Time coordinating  discharge: Over 30 minutes  SIGNED:   Ezekiel Slocumb, DO [facility] 08/15/2020, 2:23  PM   If 7PM-7AM, please contact night-coverage [email or web address]

## 2020-08-16 ENCOUNTER — Telehealth: Payer: Self-pay

## 2020-08-16 ENCOUNTER — Other Ambulatory Visit: Payer: Self-pay

## 2020-08-16 DIAGNOSIS — Z86010 Personal history of colonic polyps: Secondary | ICD-10-CM

## 2020-08-16 LAB — SURGICAL PATHOLOGY

## 2020-08-16 NOTE — Telephone Encounter (Signed)
-----   Message from Lucilla Lame, MD sent at 08/16/2020 10:05 AM EDT ----- This patient should be set up with a surgeon to discuss possible removal of this lesion.

## 2020-08-16 NOTE — Telephone Encounter (Signed)
Tried contacting pt but no voicemail to leave message. Referral sent to [facility].

## 2020-08-16 NOTE — Telephone Encounter (Signed)
Transition Care Management Follow-up Telephone Call  Date of discharge and from where: [facility] on 08/15/20  How have you been since you were released from the hospital? Doing fine but is still weak. Pt just took a shower and is SOB and tired. Pt states she has a knot on the inside of her left arm where the b/p cuff was. Pt states it is sore but not open or warm to touch. Pt wanted that noted in her chart. Pt states that she has not had a BM since d/c. Pt states she slept well last night and her appetite is normal. Declines pain elsewhere, fever or n/v/d.  Any questions or concerns? No   Items Reviewed:  Did the pt receive and understand the discharge instructions provided? Pt states she has not reviewing all of her d/c instructions yet. Reviewed all d/c instructions with patient over the phone.  Medications obtained and verified? Yes , verified all new and discontinued medications.  Any new allergies since your discharge? No   Dietary orders reviewed? Yes  Do you have support at home? Yes   Other (ie: DME, Home Health, etc): Yes- PT, OT and a nurse was ordered from [facility]. Pt has not heard from the facility yet.  Functional Questionnaire: (I = Independent and D = Dependent)  Bathing/Dressing- I   Meal Prep- I  Eating- I  Maintaining continence- I  Transferring/Ambulation- I  Managing Meds- I   Follow up appointments reviewed:    PCP Hospital f/u appt confirmed? Yes  scheduled to see Fenton Malling on 08/24/20 @ 11:00 AM.  [facility] f/u appt confirmed? Yes    Are transportation arrangements needed? No   If their condition worsens, is the pt aware to call  their PCP or go to the ED? Yes  Was the patient provided with contact information for the PCP's office or ED? Yes  Was the pt encouraged to call back with questions or concerns? Yes

## 2020-08-16 NOTE — Telephone Encounter (Signed)
HFU scheduled 08/24/20 @ 11:00 AM.

## 2020-08-22 ENCOUNTER — Telehealth: Payer: Self-pay

## 2020-08-22 NOTE — Telephone Encounter (Signed)
Called patient no answer to follow up on medication reaction. She called Saturday that her medication was making her "groggy" per the [facility] nurse triage note and wanted to follow up with her.

## 2020-08-23 ENCOUNTER — Telehealth: Payer: Self-pay

## 2020-08-23 NOTE — Telephone Encounter (Signed)
Copied from [place] [id number]. Topic: General - Other >> Aug 23, 2020  3:02 PM Keene Breath wrote: Reason for CRM: Patient would like the nurse to call her regarding her appts. That seem to be too close to each other.  CB#  [phone]

## 2020-08-23 NOTE — Telephone Encounter (Signed)
Patient was calling that she has an appointment tomorrow and also on 09/20 advised patient to keep the one for tomorrow

## 2020-08-24 ENCOUNTER — Ambulatory Visit
Admission: RE | Admit: 2020-08-24 | Discharge: 2020-08-24 | Disposition: A | Discharge status: Home / Self Care | Payer: Medicare HMO | Source: Ambulatory Visit | Attending: Physician Assistant | Admitting: Physician Assistant

## 2020-08-24 ENCOUNTER — Encounter: Payer: Self-pay | Admitting: Physician Assistant

## 2020-08-24 ENCOUNTER — Ambulatory Visit
Admission: RE | Admit: 2020-08-24 | Discharge: 2020-08-24 | Disposition: A | Discharge status: Home / Self Care | Payer: Medicare HMO | Attending: Physician Assistant | Admitting: Physician Assistant

## 2020-08-24 ENCOUNTER — Other Ambulatory Visit: Payer: Self-pay

## 2020-08-24 ENCOUNTER — Ambulatory Visit: Payer: Medicare HMO | Admitting: Physician Assistant

## 2020-08-24 VITALS — BP 120/81 | HR 79 | Temp 99.0°F | Resp 18 | Wt 114.0 lb

## 2020-08-24 DIAGNOSIS — J189 Pneumonia, unspecified organism: Secondary | ICD-10-CM | POA: Diagnosis not present

## 2020-08-24 DIAGNOSIS — I4891 Unspecified atrial fibrillation: Secondary | ICD-10-CM

## 2020-08-24 DIAGNOSIS — I890 Lymphedema, not elsewhere classified: Secondary | ICD-10-CM | POA: Diagnosis not present

## 2020-08-24 DIAGNOSIS — D649 Anemia, unspecified: Secondary | ICD-10-CM | POA: Diagnosis not present

## 2020-08-24 DIAGNOSIS — Z7689 Persons encountering health services in other specified circumstances: Secondary | ICD-10-CM

## 2020-08-24 MED ORDER — FUROSEMIDE 40 MG PO TABS: 40.0000 mg | ORAL_TABLET | Freq: Every day | ORAL | 1 refills | Status: DC

## 2020-08-24 NOTE — Progress Notes (Signed)
I,Roshena L Chambers,acting as a scribe for [employer], PA-C.,have documented all relevant documentation on the behalf of Mar Daring, PA-C,as directed by  Mar Daring, PA-C while in the presence of Mar Daring, [place].  Established patient visit   Patient: Jackie Turner   DOB: [birth year]   84 y.o. Female  MRN: [id number] Visit Date: 08/24/2020  Today's healthcare provider: Mar Daring, PA-C   Chief Complaint  Patient presents with  . Hospitalization Follow-up   Subjective    HPI  Follow up Hospitalization  Patient was admitted to [facility] on 08/04/2020 and discharged on 08/15/2020. She was treated for blood loss Anemia, wheezing, SOB and hypoxia. Treatment for this included -see discharge summary. Telephone follow up was done on 08/16/2020 She reports good compliance with treatment. She reports this condition is stable. She states her breathing is stable and she is on 2L continuous oxygen.    ----------------------------------------------------------------------------------------- -  Patient Active Problem List   Diagnosis Date Noted  . Polyp of colon   . Acute blood loss anemia 08/09/2020  . GI bleeding 08/04/2020  . Depression, major, single episode, in partial remission ([place]) 06/22/2020  . Atrial fibrillation with RVR ([place]) 06/13/2020  . Acute on chronic respiratory failure with hypoxia and hypercapnia ([place]) 06/13/2020  . Right hand pain 06/13/2020  . Fall at home, initial encounter 06/13/2020  . COPD (chronic obstructive pulmonary disease) ([place]) 06/13/2020  . Bronchiectasis without complication ([place]) 40/98/1191  . Lymphedema 05/17/2019  . Chronic venous insufficiency 05/17/2019  . Swelling of limb 02/10/2018  . Varicose veins of leg with swelling, bilateral 02/10/2018  . Squamous cell carcinoma 09/22/2017  . Hypoxia 09/27/2015  . Abnormal CXR 09/27/2015  . Asthma with allergic rhinitis 08/31/2015  . Back ache  08/31/2015  . Chronic interstitial cystitis 08/31/2015  . Grief reaction 08/31/2015  . Hyperlipidemia 06/26/2015  . Migraine 06/26/2015  . Asthma 06/26/2015  . IBS (irritable bowel syndrome) 06/26/2015  . Osteoporosis 06/26/2015  . Fatigue 06/26/2015  . Lumbar radiculopathy 04/27/2013  . Urinary urgency 01/08/2012  . Frequency of urination 01/08/2012   Past Medical History:  Diagnosis Date  . Allergy   . Asthma   . Hyperlipidemia   . Osteoporosis        Medications: Outpatient Medications Prior to Visit  Medication Sig  . calcium carbonate (CALTRATE 600) 1500 (600 Ca) MG TABS tablet Take 1 tablet by mouth 2 (two) times daily with a meal.   . Clobetasol Propionate Emulsion 0.05 % topical foam Apply 1 application topically 2 (two) times daily.   . diclofenac Sodium (VOLTAREN) 1 % GEL Apply 4 g topically 4 (four) times daily.  Marland Kitchen dicyclomine (BENTYL) 10 MG capsule Take 1 capsule (10 mg total) by mouth 3 (three) times daily as needed for spasms.  Marland Kitchen doxepin (SINEQUAN) 10 MG capsule Take 3 capsules (30 mg total) by mouth at bedtime.  . feeding supplement, ENSURE ENLIVE, (ENSURE ENLIVE) LIQD Take 237 mLs by mouth 2 (two) times daily between meals.  . fluticasone (FLONASE) 50 MCG/ACT nasal spray Place 1-2 sprays into both nostrils daily.   . Fluticasone-Umeclidin-Vilant (TRELEGY ELLIPTA) 100-62.5-25 MCG/INH AEPB Inhale 1 puff into the lungs daily.  . furosemide (LASIX) 40 MG tablet Take 1 tablet (40 mg total) by mouth daily.  . metoprolol tartrate (LOPRESSOR) 25 MG tablet Take 1 tablet (25 mg total) by mouth 2 (two) times daily.  Marland Kitchen nystatin (MYCOSTATIN) 100000 UNIT/ML suspension TAKE 5 MLS BY MOUTH  4 TIMES A DAY. (Patient taking differently: Take 5 mLs by mouth 4 (four) times daily. )  . oxybutynin (DITROPAN XL) 15 MG 24 hr tablet Take 1 tablet (15 mg total) by mouth at bedtime.  . pentosan polysulfate (ELMIRON) 100 MG capsule Take 1 capsule (100 mg total) by mouth in the morning and at  bedtime. (Patient taking differently: Take 100 mg by mouth 2 (two) times daily as needed (urinary pain). )  . ULTRAM 50 MG tablet Take 1 tablet (50 mg total) by mouth every 6 (six) hours as needed. (Patient taking differently: Take 50 mg by mouth every 6 (six) hours as needed for moderate pain. )  . Urea 40 % LOTN Apply 1 application topically daily as needed (dry skin).   . VENTOLIN HFA 108 (90 Base) MCG/ACT inhaler INHALE TWO PUFFS INTO THE LUNGS EVERY 4 HOURS AS NEEDED FOR WHEEZING OR SHORTNESS OF BREATH (Patient taking differently: Inhale 2 puffs into the lungs every 4 (four) hours as needed for wheezing or shortness of breath. )  . vitamin B-12 1000 MCG tablet Take 1 tablet (1,000 mcg total) by mouth daily.   No facility-administered medications prior to visit.    Review of Systems  Constitutional: Positive for fatigue (improved back to baseline). Negative for appetite change, chills and fever.  Respiratory: Negative for chest tightness and shortness of breath.   Cardiovascular: Negative for chest pain and palpitations.  Gastrointestinal: Negative for abdominal pain, nausea and vomiting.  Neurological: Negative for dizziness and weakness.    Last CBC Lab Results  Component Value Date   WBC 7.8 08/15/2020   HGB 9.1 (L) 08/15/2020   HCT 31.4 (L) 08/15/2020   MCV 80.3 08/15/2020   MCH 23.3 (L) 08/15/2020   RDW 18.9 (H) 08/15/2020   PLT 271 37/62/8315   Last metabolic panel Lab Results  Component Value Date   GLUCOSE 107 (H) 08/15/2020   NA 141 08/15/2020   K 4.0 08/15/2020   CL 98 08/15/2020   CO2 34 (H) 08/15/2020   BUN 16 08/15/2020   CREATININE 0.70 08/15/2020   GFRNONAA >60 08/15/2020   GFRAA >60 08/15/2020   CALCIUM 8.7 (L) 08/15/2020   PHOS 2.3 (L) 08/08/2020   PROT 6.2 (L) 08/11/2020   ALBUMIN 3.6 08/11/2020   LABGLOB 2.1 08/03/2020   AGRATIO 1.9 08/03/2020   BILITOT 0.9 08/11/2020   ALKPHOS 58 08/11/2020   AST 23 08/11/2020   ALT 14 08/11/2020   ANIONGAP 9  08/15/2020      Objective    BP 120/81 (BP Location: Left Arm, Patient Position: Sitting, Cuff Size: Normal)   Pulse 79   Temp 99 F (37.2 C) (Oral)   Resp 18   Wt 114 lb (51.7 kg)   SpO2 99% Comment: 2 L 02 nasal canula  BMI 23.03 kg/m  BP Readings from Last 3 Encounters:  08/24/20 120/81  08/15/20 (!) 98/56  08/03/20 128/80   Wt Readings from Last 3 Encounters:  08/24/20 114 lb (51.7 kg)  08/15/20 110 lb 1.6 oz (49.9 kg)  08/03/20 119 lb (54 kg)      Physical Exam Vitals reviewed.  Constitutional:      General: She is not in acute distress.    Appearance: Normal appearance. She is well-developed. She is not ill-appearing or diaphoretic.  Neck:     Thyroid: No thyromegaly.     Vascular: No JVD.     Trachea: No tracheal deviation.  Cardiovascular:     Rate and  Rhythm: Normal rate. Rhythm irregularly irregular.     Heart sounds: Normal heart sounds. No murmur heard.  No friction rub. No gallop.   Pulmonary:     Effort: Pulmonary effort is normal. No respiratory distress.     Breath sounds: Wheezing (inspiratory and expiratory) present. No rales.  Musculoskeletal:     Cervical back: Normal range of motion and neck supple.  Lymphadenopathy:     Cervical: No cervical adenopathy.  Neurological:     Mental Status: She is alert.       No results found for any visits on 08/24/20.  Assessment & Plan     1. Anemia, unspecified type Secondary to Eliquis 5mg  BID. Rechecking labs as below. Large polyp on colonoscopy had been unable to remove. Endoscopy essential normal. Has f/u with Dr. Allen Norris on Monday, 08/28/20. Dr. Allen Norris has also referred her to general surgery, Dr. Dahlia Byes, on 08/30/20. Will await restarting low dose eliquis at this time pending these appointments and input from Dr. Saralyn Pilar. If patient may require surgical intervention, may be best to await surgeries and procedures before trial restart of low dose if needed. Will also place referral to occupational therapy  as below for driving. Patient has not been driving since hospitalization in June 2021. Will f/u pending their recommendation on driving.  - Basic Metabolic Panel (BMET) - CBC with Differential/Platelet - Ambulatory referral to Occupational Therapy  2. Pneumonia due to infectious organism, unspecified laterality, unspecified part of lung Noted on CXR from 08/10/20. Will repeat CXR to see if improved. Will f/u pending results.  - DG Chest 2 View; Future  3. Atrial fibrillation with RVR (HCC) Still irregular sounding rhythm today but rate controlled at 79. Continue Furosemide 40mg  daily, metoprolol 25mg  BID. Has follow up with Dr. Lorinda Creed, cardiology, next Tuesday 08/29/20. - Ambulatory referral to Occupational Therapy  4. Lymphedema Continue using lymphedema pumps at night. Continue Elmiron BID and Furosemide 40mg  daily.   5. Encounter for support and coordination of transition of care Hospital notes, labs, images, and procedure notes from hospitalization from 08/04/20 - 08/15/20 all reviewed prior to patient arrival. Transition of care call placed on 08/16/20.    No follow-ups on file.      Reynolds Bowl, PA-C, have reviewed all documentation for this visit. The documentation on 08/24/20 for the exam, diagnosis, procedures, and orders are all accurate and complete.   Rubye Beach  [employer] [phone] (phone) [phone] (fax)  [place]

## 2020-08-25 ENCOUNTER — Telehealth: Payer: Self-pay

## 2020-08-25 LAB — CBC WITH DIFFERENTIAL/PLATELET
Basophils Absolute: 0.1 10*3/uL (ref 0.0–0.2)
Basos: 2 %
EOS (ABSOLUTE): 0.5 10*3/uL — ABNORMAL HIGH (ref 0.0–0.4)
Eos: 7 %
Hematocrit: 33 % — ABNORMAL LOW (ref 34.0–46.6)
Hemoglobin: 9.8 g/dL — ABNORMAL LOW (ref 11.1–15.9)
Immature Grans (Abs): 0 10*3/uL (ref 0.0–0.1)
Immature Granulocytes: 1 %
Lymphocytes Absolute: 1.1 10*3/uL (ref 0.7–3.1)
Lymphs: 16 %
MCH: 23.8 pg — ABNORMAL LOW (ref 26.6–33.0)
MCHC: 29.7 g/dL — ABNORMAL LOW (ref 31.5–35.7)
MCV: 80 fL (ref 79–97)
Monocytes Absolute: 0.7 10*3/uL (ref 0.1–0.9)
Monocytes: 11 %
Neutrophils Absolute: 4.2 10*3/uL (ref 1.4–7.0)
Neutrophils: 63 %
Platelets: 424 10*3/uL (ref 150–450)
RBC: 4.11 x10E6/uL (ref 3.77–5.28)
RDW: 19.8 % — ABNORMAL HIGH (ref 11.7–15.4)
WBC: 6.6 10*3/uL (ref 3.4–10.8)

## 2020-08-25 LAB — BASIC METABOLIC PANEL
BUN/Creatinine Ratio: 21 (ref 12–28)
BUN: 20 mg/dL (ref 8–27)
CO2: 34 mmol/L — ABNORMAL HIGH (ref 20–29)
Calcium: 9.6 mg/dL (ref 8.7–10.3)
Chloride: 93 mmol/L — ABNORMAL LOW (ref 96–106)
Creatinine, Ser: 0.95 mg/dL (ref 0.57–1.00)
GFR calc Af Amer: 65 mL/min/{1.73_m2} (ref >59)
GFR calc non Af Amer: 56 mL/min/{1.73_m2} — ABNORMAL LOW (ref >59)
Glucose: 105 mg/dL — ABNORMAL HIGH (ref 65–99)
Potassium: 4.5 mmol/L (ref 3.5–5.2)
Sodium: 141 mmol/L (ref 134–144)

## 2020-08-25 MED ORDER — FERROUS GLUCONATE 324 (38 FE) MG PO TABS: 324.0000 mg | ORAL_TABLET | Freq: Every day | ORAL | 0 refills | Status: DC

## 2020-08-25 NOTE — Telephone Encounter (Signed)
-----   Message from Mar Daring, [place] sent at 08/25/2020  8:21 AM EDT ----- CXR has improved from the edema and fluid that had been seen on the CXR from the hospital. There is the permanent scarring that has been present for a while. It is unchanged.

## 2020-08-25 NOTE — Telephone Encounter (Signed)
Sent in

## 2020-08-25 NOTE — Telephone Encounter (Signed)
-----   Message from Mar Daring, PA-C sent at 08/25/2020  8:22 AM EDT ----- Kidney function is good. Sodium, potassium and calcium are normal. Hemoglobin has improved since the hospital and is holding steady at 9.8 currently. Could add a daily ferrous gluconate (easier on the stomach) to keep building hemoglobin back if desired. We can cancel her 9/20 appt with me and push out for 4 weeks.

## 2020-08-25 NOTE — Telephone Encounter (Signed)
Patient advised as directed below. She asked if the ferrous gluconate can be send in to the pharmacy please

## 2020-08-28 ENCOUNTER — Other Ambulatory Visit: Payer: Self-pay

## 2020-08-28 ENCOUNTER — Encounter: Payer: Self-pay | Admitting: Gastroenterology

## 2020-08-28 ENCOUNTER — Ambulatory Visit: Payer: Medicare HMO | Admitting: Gastroenterology

## 2020-08-28 VITALS — BP 92/53 | HR 78 | Ht 59.0 in | Wt 112.2 lb

## 2020-08-28 DIAGNOSIS — K921 Melena: Secondary | ICD-10-CM

## 2020-08-28 NOTE — Progress Notes (Signed)
Primary Care Physician: Mar Daring, PA-C  Primary Gastroenterologist:  Dr. Lucilla Lame  Chief Complaint  Patient presents with  . Hospitalization Follow-up    HPI: Jackie Turner is a 84 y.o. female here For follow-up after being in the hospital.  The patient had a colonoscopy by me after being seen by Dr. Bonna Gains and Dr. Marius Ditch.  The patient was found to have a large polyp at the ileocecal valve and the lesion was not removed due to the size and location.  The biopsies came back showing it to be a tubular adenoma. The patient is following up as she was told to from the hospital.  The patient has not had any further sign of bleeding.  She also reports that she has an appointment coming up with a surgeon for possible removal of the large polyp.  Past Medical History:  Diagnosis Date  . Allergy   . Asthma   . Hyperlipidemia   . Osteoporosis     Current Outpatient Medications  Medication Sig Dispense Refill  . calcium carbonate (CALTRATE 600) 1500 (600 Ca) MG TABS tablet Take 1 tablet by mouth 2 (two) times daily with a meal.   0  . Clobetasol Propionate Emulsion 0.05 % topical foam Apply 1 application topically 2 (two) times daily.  100 g 0  . diclofenac Sodium (VOLTAREN) 1 % GEL Apply 4 g topically 4 (four) times daily. 400 g 1  . dicyclomine (BENTYL) 10 MG capsule Take 1 capsule (10 mg total) by mouth 3 (three) times daily as needed for spasms. 90 capsule 1  . doxepin (SINEQUAN) 10 MG capsule Take 3 capsules (30 mg total) by mouth at bedtime. 270 capsule 1  . feeding supplement, ENSURE ENLIVE, (ENSURE ENLIVE) LIQD Take 237 mLs by mouth 2 (two) times daily between meals. 237 mL 12  . ferrous gluconate (FERGON) 324 MG tablet Take 1 tablet (324 mg total) by mouth daily with breakfast. 90 tablet 0  . fluticasone (FLONASE) 50 MCG/ACT nasal spray Place 1-2 sprays into both nostrils daily.     . Fluticasone-Umeclidin-Vilant (TRELEGY ELLIPTA) 100-62.5-25 MCG/INH AEPB  Inhale 1 puff into the lungs daily. 60 each 11  . furosemide (LASIX) 40 MG tablet Take 1 tablet (40 mg total) by mouth daily. 90 tablet 1  . metoprolol tartrate (LOPRESSOR) 25 MG tablet Take 1 tablet (25 mg total) by mouth 2 (two) times daily. 60 tablet 1  . nystatin (MYCOSTATIN) 100000 UNIT/ML suspension TAKE 5 MLS BY MOUTH 4 TIMES A DAY. (Patient taking differently: Take 5 mLs by mouth 4 (four) times daily. ) 60 mL 5  . oxybutynin (DITROPAN XL) 15 MG 24 hr tablet Take 1 tablet (15 mg total) by mouth at bedtime. 90 tablet 1  . pentosan polysulfate (ELMIRON) 100 MG capsule Take 1 capsule (100 mg total) by mouth in the morning and at bedtime. (Patient taking differently: Take 100 mg by mouth 2 (two) times daily as needed (urinary pain). ) 180 capsule 1  . ULTRAM 50 MG tablet Take 1 tablet (50 mg total) by mouth every 6 (six) hours as needed. (Patient taking differently: Take 50 mg by mouth every 6 (six) hours as needed for moderate pain. ) 90 tablet 5  . Urea 40 % LOTN Apply 1 application topically daily as needed (dry skin).   0  . VENTOLIN HFA 108 (90 Base) MCG/ACT inhaler INHALE TWO PUFFS INTO THE LUNGS EVERY 4 HOURS AS NEEDED FOR WHEEZING OR SHORTNESS OF BREATH (  Patient taking differently: Inhale 2 puffs into the lungs every 4 (four) hours as needed for wheezing or shortness of breath. ) 18 g 5  . vitamin B-12 1000 MCG tablet Take 1 tablet (1,000 mcg total) by mouth daily. 30 tablet 1   No current facility-administered medications for this visit.    Allergies as of 08/28/2020 - Review Complete 08/28/2020  Allergen Reaction Noted  . Nsaids Other (See Comments) 06/26/2015  . Amoxicillin Nausea Only and Rash 06/26/2015  . Ibandronic acid Other (See Comments) 06/26/2015  . Actonel  [risedronate] Other (See Comments) 08/31/2015  . Antihistamines, chlorpheniramine-type  08/31/2015  . Aspirin  08/31/2015  . Buspar [buspirone] Other (See Comments) 06/16/2017  . Butalbital-aspirin-caffeine Other  (See Comments) 06/27/2015  . Clarithromycin Other (See Comments) 06/26/2015  . Codeine  08/31/2015  . Gatifloxacin Other (See Comments) 08/31/2015  . Hydrocodone-guaifenesin Nausea Only 06/16/2017  . Moxifloxacin  08/31/2015  . Oxycodone Other (See Comments) 06/16/2017  . Penicillins Other (See Comments) 08/31/2015  . Risedronate sodium  08/31/2015  . Tylenol with codeine #3  [acetaminophen-codeine]  06/27/2015  . Raloxifene Other (See Comments) 06/26/2015    ROS:  General: Negative for anorexia, weight loss, fever, chills, fatigue, weakness. ENT: Negative for hoarseness, difficulty swallowing , nasal congestion. CV: Negative for chest pain, angina, palpitations, dyspnea on exertion, peripheral edema.  Respiratory: Negative for dyspnea at rest, dyspnea on exertion, cough, sputum, wheezing.  GI: See history of present illness. GU:  Negative for dysuria, hematuria, urinary incontinence, urinary frequency, nocturnal urination.  Endo: Negative for unusual weight change.    Physical Examination:   BP (!) 92/53   Pulse 78   Ht 4\' 11"  (1.499 m)   Wt 112 lb 3.2 oz (50.9 kg)   BMI 22.66 kg/m   General: Well-nourished, well-developed in no acute distress.  Eyes: No icterus. Conjunctivae pink. Neuro: Alert and oriented x 3.  Grossly intact. Skin: Warm and dry, no jaundice.   Psych: Alert and cooperative, normal mood and affect.  Labs:    Imaging Studies: DG Chest 2 View  Result Date: 08/24/2020 CLINICAL DATA:  Short of breath for 2 months, history of asthma EXAM: CHEST - 2 VIEW COMPARISON:  08/10/2020 FINDINGS: Frontal and lateral views of the chest demonstrate a stable cardiac silhouette. Chronic elevation of the left hemidiaphragm. No airspace disease, effusion, or pneumothorax. Background scarring again noted, greatest at the bases. No acute bony abnormalities. Stable lower thoracic wedge compression deformity. IMPRESSION: 1. Chronic interstitial scarring.  No acute airspace  disease. 2. Chronic elevation left hemidiaphragm. Electronically Signed   By: Randa Ngo M.D.   On: 08/24/2020 21:58   CT HEAD WO CONTRAST  Result Date: 08/08/2020 CLINICAL DATA:  Delirium with altered mental status EXAM: CT HEAD WITHOUT CONTRAST TECHNIQUE: Contiguous axial images were obtained from the base of the skull through the vertex without intravenous contrast. COMPARISON:  None. FINDINGS: Brain: There is age related volume loss. There is no intracranial mass, hemorrhage, extra-axial fluid collection, or midline shift. There is slight small vessel disease in the centra semiovale bilaterally. No acute infarct is evident. Vascular: No hyperdense vessel. There is calcification in each carotid siphon region. Skull: The bony calvarium appears intact. Sinuses/Orbits: There is mucosal thickening in several ethmoid air cells. Other visualized paranasal sinuses are clear. Visualized orbits appear symmetric bilaterally. Other: Mastoid air cells are clear. IMPRESSION: Age related volume loss with mild periventricular small vessel disease. No acute infarct. No mass or hemorrhage. There are foci of arterial  vascular calcification. There is mucosal thickening in several ethmoid air cells. Electronically Signed   By: Lowella Grip III M.D.   On: 08/08/2020 13:43   CT ANGIO CHEST PE W OR WO CONTRAST  Result Date: 08/08/2020 CLINICAL DATA:  Chest pain, shortness of breath, pleural effusion, rule out pulmonary embolism EXAM: CT ANGIOGRAPHY CHEST WITH CONTRAST TECHNIQUE: Multidetector CT imaging of the chest was performed using the standard protocol during bolus administration of intravenous contrast. Multiplanar CT image reconstructions and MIPs were obtained to evaluate the vascular anatomy. CONTRAST:  39mL OMNIPAQUE IOHEXOL 350 MG/ML SOLN COMPARISON:  06/12/2020 FINDINGS: Cardiovascular: Satisfactory opacification of the pulmonary arteries to the segmental level. No evidence of pulmonary embolism. Mild  cardiomegaly. Trace pericardial effusion. Aortic atherosclerosis Mediastinum/Nodes: No enlarged mediastinal, hilar, or axillary lymph nodes. Thyroid gland, trachea, and esophagus demonstrate no significant findings. Lungs/Pleura: Moderate bilateral pleural effusions and associated atelectasis or consolidation. Redemonstrated dense scarring of the left lung base. Upper Abdomen: No acute abnormality. Musculoskeletal: No chest wall abnormality. Redemonstrated high-grade wedge deformity of the T10 vertebral body. Review of the MIP images confirms the above findings. IMPRESSION: 1. Negative examination for pulmonary embolism. 2. Moderate bilateral pleural effusions and associated atelectasis or consolidation. 3. Aortic Atherosclerosis (ICD10-I70.0). Electronically Signed   By: Eddie Candle M.D.   On: 08/08/2020 14:13   DG Chest Port 1 View  Result Date: 08/10/2020 CLINICAL DATA:  Hypoxia EXAM: PORTABLE CHEST 1 VIEW COMPARISON:  Chest radiograph August 07, 2020 and chest CT August 08, 2020 FINDINGS: There are persistent pleural effusions bilaterally with atelectasis and ill-defined airspace opacity in the lung bases. There is also atelectatic change in the left mid lung region. There is mild interstitial edema. There is cardiomegaly with pulmonary venous hypertension. No adenopathy. There is aortic atherosclerosis. Monitor device over the left hemithorax anteriorly. T10 vertebral body fracture better seen on recent CT. IMPRESSION: Cardiomegaly with a degree of pulmonary vascular congestion. Pleural effusions with a degree of interstitial edema. Suspect a degree of underlying congestive heart failure. There is atelectatic change with patchy airspace opacity in the lung bases. There may be a degree of alveolar edema or possible pneumonia in the lung bases. Appearance similar to recent studies. Aortic Atherosclerosis (ICD10-I70.0). Electronically Signed   By: Lowella Grip III M.D.   On: 08/10/2020 08:13   DG Chest  Port 1 View  Result Date: 08/07/2020 CLINICAL DATA:  Increased shortness of breath EXAM: PORTABLE CHEST 1 VIEW COMPARISON:  08/06/2020 FINDINGS: Interval increase in diffuse bilateral interstitial pulmonary opacity. Unchanged elevation of the left hemidiaphragm. Probable small layering bilateral pleural effusions. Mild cardiomegaly. IMPRESSION: Interval increase in diffuse bilateral interstitial pulmonary opacity, which may reflect edema and/or infection. Probable small layering bilateral pleural effusions. Electronically Signed   By: Eddie Candle M.D.   On: 08/07/2020 10:15   DG Chest Port 1 View  Result Date: 08/06/2020 CLINICAL DATA:  Wheezing, anemia EXAM: PORTABLE CHEST 1 VIEW COMPARISON:  06/12/2020 chest radiograph. FINDINGS: Chronic prominent elevation of the left hemidiaphragm. Stable cardiomediastinal silhouette with mild cardiomegaly. No pneumothorax. Small bilateral pleural effusions are increased bilaterally. Patchy consolidation at both lung bases, worsened. IMPRESSION: 1. Chronic prominent left hemidiaphragm elevation. Worsened patchy consolidation at both lung bases, which could represent any combination of atelectasis, aspiration or pneumonia. 2. Mild cardiomegaly. Small bilateral pleural effusions are increased bilaterally. Electronically Signed   By: Ilona Sorrel M.D.   On: 08/06/2020 11:55   ECHOCARDIOGRAM COMPLETE  Result Date: 08/13/2020    ECHOCARDIOGRAM REPORT  Patient Name:   ATHZIRI FREUNDLICH Date of Exam: 08/13/2020 Medical Rec #:  [id number]             Height:       59.0 in Accession #:    [id number]            Weight:       112.2 lb Date of Birth:  Apr 16, 1936             BSA:          1.443 m Patient Age:    46 years              BP:           91/61 mmHg Patient Gender: F                     HR:           82 bpm. Exam Location:  [facility] Procedure: 2D Echo, Cardiac Doppler and Color Doppler Indications:     Chf- acute diastolic 409.73  History:         Patient has prior history  of Echocardiogram examinations, most                  recent 06/13/2020. Risk Factors:Dyslipidemia.  Sonographer:     Sherrie Sport RDCS (AE) Referring Phys:  [id number] Floyce Stakes GRIFFITH Diagnosing Phys: Serafina Royals MD IMPRESSIONS  1. Left ventricular ejection fraction, by estimation, is 55 to 60%. The left ventricle has normal function. The left ventricle has no regional wall motion abnormalities. Left ventricular diastolic parameters were normal.  2. Right ventricular systolic function is normal. The right ventricular size is normal. There is normal pulmonary artery systolic pressure.  3. Left atrial size was mild to moderately dilated.  4. Right atrial size was mild to moderately dilated.  5. The mitral valve is normal in structure. Moderate mitral valve regurgitation.  6. Tricuspid valve regurgitation is moderate.  7. The aortic valve is normal in structure. Aortic valve regurgitation is trivial. FINDINGS  Left Ventricle: Left ventricular ejection fraction, by estimation, is 55 to 60%. The left ventricle has normal function. The left ventricle has no regional wall motion abnormalities. The left ventricular internal cavity size was normal in size. There is  no left ventricular hypertrophy. Left ventricular diastolic parameters were normal. Right Ventricle: The right ventricular size is normal. No increase in right ventricular wall thickness. Right ventricular systolic function is normal. There is normal pulmonary artery systolic pressure. The tricuspid regurgitant velocity is 2.31 m/s, and  with an assumed right atrial pressure of 10 mmHg, the estimated right ventricular systolic pressure is 26.8 mmHg. Left Atrium: Left atrial size was mild to moderately dilated. Right Atrium: Right atrial size was mild to moderately dilated. Pericardium: There is no evidence of pericardial effusion. Mitral Valve: The mitral valve is normal in structure. Moderate mitral valve regurgitation. Tricuspid Valve: The tricuspid valve is  normal in structure. Tricuspid valve regurgitation is moderate. Aortic Valve: The aortic valve is normal in structure. Aortic valve regurgitation is trivial. Aortic valve mean gradient measures 3.5 mmHg. Aortic valve peak gradient measures 5.7 mmHg. Aortic valve area, by VTI measures 2.41 cm. Pulmonic Valve: The pulmonic valve was normal in structure. Pulmonic valve regurgitation is not visualized. Aorta: The aortic root and ascending aorta are structurally normal, with no evidence of dilitation. IAS/Shunts: No atrial level shunt detected by color flow Doppler.  LEFT VENTRICLE PLAX 2D LVIDd:  4.09 cm LVIDs:         2.89 cm LV PW:         1.13 cm LV IVS:        1.36 cm LVOT diam:     2.00 cm LV SV:         50 LV SV Index:   35 LVOT Area:     3.14 cm  RIGHT VENTRICLE RV S prime:     9.03 cm/s TAPSE (M-mode): 2.7 cm LEFT ATRIUM           Index       RIGHT ATRIUM           Index LA diam:      4.50 cm 3.12 cm/m  RA Area:     17.40 cm LA Vol (A4C): 58.1 ml 40.27 ml/m RA Volume:   44.90 ml  31.12 ml/m  AORTIC VALVE                   PULMONIC VALVE AV Area (Vmax):    2.24 cm    PV Vmax:        0.49 m/s AV Area (Vmean):   2.28 cm    PV Peak grad:   1.0 mmHg AV Area (VTI):     2.41 cm    RVOT Peak grad: 2 mmHg AV Vmax:           119.00 cm/s AV Vmean:          82.200 cm/s AV VTI:            0.207 m AV Peak Grad:      5.7 mmHg AV Mean Grad:      3.5 mmHg LVOT Vmax:         85.00 cm/s LVOT Vmean:        59.700 cm/s LVOT VTI:          0.159 m LVOT/AV VTI ratio: 0.77  AORTA Ao Root diam: 2.60 cm MITRAL VALVE               TRICUSPID VALVE MV Area (PHT): 4.06 cm    TR Peak grad:   21.3 mmHg MV Decel Time: 187 msec    TR Vmax:        231.00 cm/s MV E velocity: 82.90 cm/s                            SHUNTS                            Systemic VTI:  0.16 m                            Systemic Diam: 2.00 cm Serafina Royals MD Electronically signed by Serafina Royals MD Signature Date/Time: 08/13/2020/2:06:31 PM    Final      Assessment and Plan:   Glynis Hunsucker is a 84 y.o. y/o female Who comes in today after having a colonoscopy with a polyp of the ileocecal valve.  The polyp was any adenoma.  The patient was recommended to discuss with surgery whether or not she wants to have the polyp removed.  The patient has been explained the plan and has and a point with surgery to discuss the plan with them.     Lucilla Lame, MD. Marval Regal    Note: This dictation  was prepared with Dragon dictation along with smaller phrase technology. Any transcriptional errors that result from this process are unintentional.

## 2020-08-29 ENCOUNTER — Telehealth: Payer: Self-pay

## 2020-08-29 NOTE — Telephone Encounter (Signed)
Please review

## 2020-08-29 NOTE — Telephone Encounter (Signed)
Pt is calling to add on to the previous message a different issue  to let jennifer know she has the results from endo/colonoscopy. Pt is sch to see  General surgery tomorrow she has something in her colon the lower part. Pt would like jennifer to get with GI specialist and get all over her office notes etc to discuss with her personally

## 2020-08-29 NOTE — Telephone Encounter (Signed)
Copied from [facility] [place] [phone]. Topic: General - Inquiry >> Aug 29, 2020 10:49 AM Gillis Ends D wrote: Reason for CRM: Patient has started 3 new medications and one of them has given her a loose stool. She thinks it is the metoprolol tartrate. She would like for the nurse to call her back. Call back at [phone]. Also, she has noticed that she has gotten some carpal tunel in her left 2 fingers on her left hand. She thinks it came from the 2 ports she had in her arm or the automatic blood pressure cuff. Please advise the patient.

## 2020-08-29 NOTE — Telephone Encounter (Signed)
It may be best just to bring her in to discuss in person. Please schedule her after 09/04/20 after she sees Dr. Dahlia Byes. Move to sooner than 10/8 please

## 2020-08-30 ENCOUNTER — Ambulatory Visit: Payer: Medicare HMO | Admitting: Surgery

## 2020-08-30 NOTE — Telephone Encounter (Signed)
Scheduled for 09/06/2020.

## 2020-09-01 ENCOUNTER — Telehealth: Payer: Self-pay | Admitting: Physician Assistant

## 2020-09-01 NOTE — Telephone Encounter (Signed)
metoprolol tartrate (LOPRESSOR) 25 MG tablet   Pt has had a severe case of diarrhea because of this medicine, pt wants a call back from  Office/clinic today  Best contact: [phone]

## 2020-09-01 NOTE — Telephone Encounter (Signed)
Patient advised as directed below. 

## 2020-09-01 NOTE — Telephone Encounter (Signed)
This is prescribed by cardiology for her atrial fibrillation.   Have her decrease to 1 daily instead of 2 daily and we can address further next wednesday

## 2020-09-04 ENCOUNTER — Ambulatory Visit: Payer: Self-pay | Admitting: Physician Assistant

## 2020-09-04 ENCOUNTER — Other Ambulatory Visit: Payer: Self-pay

## 2020-09-04 ENCOUNTER — Other Ambulatory Visit
Admission: RE | Admit: 2020-09-04 | Discharge: 2020-09-04 | Disposition: A | Discharge status: Home / Self Care | Payer: Medicare HMO | Source: Ambulatory Visit | Attending: Surgery | Admitting: Surgery

## 2020-09-04 ENCOUNTER — Ambulatory Visit (INDEPENDENT_AMBULATORY_CARE_PROVIDER_SITE_OTHER): Payer: Medicare HMO | Admitting: Surgery

## 2020-09-04 DIAGNOSIS — K6389 Other specified diseases of intestine: Secondary | ICD-10-CM | POA: Insufficient documentation

## 2020-09-04 LAB — COMPREHENSIVE METABOLIC PANEL
ALT: 16 U/L (ref 0–44)
AST: 21 U/L (ref 15–41)
Albumin: 4.3 g/dL (ref 3.5–5.0)
Alkaline Phosphatase: 69 U/L (ref 38–126)
Anion gap: 11 (ref 5–15)
BUN: 23 mg/dL (ref 8–23)
CO2: 38 mmol/L — ABNORMAL HIGH (ref 22–32)
Calcium: 10.1 mg/dL (ref 8.9–10.3)
Chloride: 92 mmol/L — ABNORMAL LOW (ref 98–111)
Creatinine, Ser: 0.98 mg/dL (ref 0.44–1.00)
GFR calc Af Amer: >60 mL/min (ref >60)
GFR calc non Af Amer: 54 mL/min — ABNORMAL LOW (ref >60)
Glucose, Bld: 106 mg/dL — ABNORMAL HIGH (ref 70–99)
Potassium: 3.8 mmol/L (ref 3.5–5.1)
Sodium: 141 mmol/L (ref 135–145)
Total Bilirubin: 0.6 mg/dL (ref 0.3–1.2)
Total Protein: 7.4 g/dL (ref 6.5–8.1)

## 2020-09-04 NOTE — Patient Instructions (Addendum)
Please go to the [facility] to have lab work done today.  Your CT scan is scheduled 09/15/20 at  2:15 @ [facility].   Do not eat or drink 4 hours prior to this test. Please stop by the Radiology desk and pick up your prep kit today when you go to have lab work done.    Referral sent to [facility]. Someone from their office will call to schedule an appointment with you within 5-7 days. Please call their office [phone] if you have not heard from anyone within the time frame listed above.   [facility]  [address],[place] [address]  Please see your follow up appointment listed below.

## 2020-09-05 ENCOUNTER — Other Ambulatory Visit: Payer: Self-pay

## 2020-09-05 DIAGNOSIS — K6389 Other specified diseases of intestine: Secondary | ICD-10-CM

## 2020-09-05 NOTE — Progress Notes (Signed)
My error in ordering the CEA, CBC as I explained to her.   Spoke with patient- she will come tomorrow to have CEA, CBC drawn. She is currently out of oxygen and will need to wait on delivery. She was very understanding. CMP results are completed.

## 2020-09-06 ENCOUNTER — Encounter: Payer: Self-pay | Admitting: Surgery

## 2020-09-06 ENCOUNTER — Ambulatory Visit: Payer: Self-pay | Admitting: Physician Assistant

## 2020-09-06 NOTE — Progress Notes (Addendum)
Patient ID: Jackie Turner, female   DOB: 1936-07-21, 84 y.o.   MRN: [id number]  HPI Jackie Turner is a 84 y.o. female seen in consultation at the request of Dr. Allen Norris for unresectable ileocecal valve polyp.  I have personally reviewed the images from the colonoscopy showing a 15 mm polyp at the ileocecal valve was not able to be resected endoscopically.  Biopsies consistent with tubular adenoma no evidence of dysplasia.  Initially went to the hospital few weeks ago for lower GI bleed.  At that time she was on Eliquis due to A. fib.  She does have a significant history of asthma and COPD.  Currently she is on oxygen.  He denies any bleeding since she stopped the Eliquis. She does have significant fragility and wears oxygen.  She is able to walk with a walker only for a few yards.  Had a previous abdominal hysterectomy in the past.  CBC shows a hemoglobin of 9.8 rest of the CBC is normal.  CMP shows evidence of hypercarbia.  Normal creatinine and albumin.  Did have a chest x-ray that I have personally reviewed showing evidence of an elevation of the left hemidiaphragm and interstitial disease. He also reports numbness and in the fourth and fifth finger on the left side  HPI  Past Medical History:  Diagnosis Date  . Allergy   . Asthma   . Hyperlipidemia   . Osteoporosis     Past Surgical History:  Procedure Laterality Date  . ABDOMINAL HYSTERECTOMY  2003  . BREAST BIOPSY  1976, 1978   benign  . carpal tunnel repair Bilateral    R 1990; L 1997  . COLONOSCOPY WITH PROPOFOL N/A 08/14/2020   Procedure: COLONOSCOPY WITH PROPOFOL;  Surgeon: Lucilla Lame, MD;  Location: [facility] ENDOSCOPY;  Service: Endoscopy;  Laterality: N/A;  . ESOPHAGOGASTRODUODENOSCOPY N/A 08/12/2020   Procedure: ESOPHAGOGASTRODUODENOSCOPY (EGD);  Surgeon: Lin Landsman, MD;  Location: [facility] ENDOSCOPY;  Service: Gastroenterology;  Laterality: N/A;  . EYE SURGERY Bilateral    cataract extraction  . HAND TENDON  SURGERY Right    Trigger finger  . IR RADIOLOGIST EVAL & MGMT  06/13/2017  . IR VERTEBROPLASTY CERV/THOR BX INC UNI/BIL INC/INJECT/IMAGING  06/17/2017  . TONSILLECTOMY  1961    Family History  Problem Relation Age of Onset  . Heart disease Mother   . CAD Mother   . Congestive Heart Failure Mother   . Osteoarthritis Mother   . Cancer Sister        breast  . Breast cancer Sister     Social History Social History   Tobacco Use  . Smoking status: Never Smoker  . Smokeless tobacco: Never Used  Vaping Use  . Vaping Use: Never used  Substance Use Topics  . Alcohol use: No  . Drug use: No    Allergies  Allergen Reactions  . Nsaids Other (See Comments)    History of gastric ulcer  . Amoxicillin Nausea Only and Rash  . Ibandronic Acid Other (See Comments)    Muscle weakness - advised not to take  **BONIVA** - drug name  . Actonel  [Risedronate] Other (See Comments)    Gastric ulcers  . Antihistamines, Chlorpheniramine-Type   . Aspirin     Gastric ulcer  . Buspar [Buspirone] Other (See Comments)    Pt does not remember reaction   . Butalbital-Aspirin-Caffeine Other (See Comments)    Other reaction(s): Unknown  . Clarithromycin Other (See Comments)    Bloating  *BIAXIN*  .  Codeine   . Gatifloxacin Other (See Comments)  . Hydrocodone-Guaifenesin Nausea Only    CODICLEAR DH STYRUP   . Moxifloxacin   . Oxycodone Other (See Comments)    Dizziness  . Penicillins Other (See Comments)  . Risedronate Sodium     Gastric ulcer  . Tylenol With Codeine #3  [Acetaminophen-Codeine]     Other reaction(s): Unknown  . Raloxifene Other (See Comments)    Bloating Bloating  *EVISTA*    Current Outpatient Medications  Medication Sig Dispense Refill  . calcium carbonate (CALTRATE 600) 1500 (600 Ca) MG TABS tablet Take 1 tablet by mouth 2 (two) times daily with a meal.   0  . Clobetasol Propionate Emulsion 0.05 % topical foam Apply 1 application topically 2 (two) times daily.   100 g 0  . diclofenac Sodium (VOLTAREN) 1 % GEL Apply 4 g topically 4 (four) times daily. 400 g 1  . dicyclomine (BENTYL) 10 MG capsule Take 1 capsule (10 mg total) by mouth 3 (three) times daily as needed for spasms. 90 capsule 1  . doxepin (SINEQUAN) 10 MG capsule Take 3 capsules (30 mg total) by mouth at bedtime. 270 capsule 1  . feeding supplement, ENSURE ENLIVE, (ENSURE ENLIVE) LIQD Take 237 mLs by mouth 2 (two) times daily between meals. 237 mL 12  . ferrous gluconate (FERGON) 324 MG tablet Take 1 tablet (324 mg total) by mouth daily with breakfast. 90 tablet 0  . fluticasone (FLONASE) 50 MCG/ACT nasal spray Place 1-2 sprays into both nostrils daily.     . Fluticasone-Umeclidin-Vilant (TRELEGY ELLIPTA) 100-62.5-25 MCG/INH AEPB Inhale 1 puff into the lungs daily. 60 each 11  . furosemide (LASIX) 40 MG tablet Take 1 tablet (40 mg total) by mouth daily. 90 tablet 1  . metoprolol tartrate (LOPRESSOR) 25 MG tablet Take 1 tablet (25 mg total) by mouth 2 (two) times daily. 60 tablet 1  . nystatin (MYCOSTATIN) 100000 UNIT/ML suspension TAKE 5 MLS BY MOUTH 4 TIMES A DAY. (Patient taking differently: Take 5 mLs by mouth 4 (four) times daily. ) 60 mL 5  . oxybutynin (DITROPAN XL) 15 MG 24 hr tablet Take 1 tablet (15 mg total) by mouth at bedtime. 90 tablet 1  . pentosan polysulfate (ELMIRON) 100 MG capsule Take 1 capsule (100 mg total) by mouth in the morning and at bedtime. (Patient taking differently: Take 100 mg by mouth 2 (two) times daily as needed (urinary pain). ) 180 capsule 1  . ULTRAM 50 MG tablet Take 1 tablet (50 mg total) by mouth every 6 (six) hours as needed. (Patient taking differently: Take 50 mg by mouth every 6 (six) hours as needed for moderate pain. ) 90 tablet 5  . Urea 40 % LOTN Apply 1 application topically daily as needed (dry skin).   0  . VENTOLIN HFA 108 (90 Base) MCG/ACT inhaler INHALE TWO PUFFS INTO THE LUNGS EVERY 4 HOURS AS NEEDED FOR WHEEZING OR SHORTNESS OF BREATH (Patient  taking differently: Inhale 2 puffs into the lungs every 4 (four) hours as needed for wheezing or shortness of breath. ) 18 g 5  . vitamin B-12 1000 MCG tablet Take 1 tablet (1,000 mcg total) by mouth daily. 30 tablet 1   No current facility-administered medications for this visit.     Review of Systems Full ROS  was asked and was negative except for the information on the HPI  Physical Exam There were no vitals taken for this visit. CONSTITUTIONAL: nad, debilitated and fragile  EYES: Pupils are equal, round,  Sclera are non-icteric. EARS, NOSE, MOUTH AND THROAT: She is wearing a mask. the oral mucosa is pink and moist. Hearing is intact to voice. LYMPH NODES:  Lymph nodes in the neck are normal. RESPIRATORY:  Lungs SOUNDS  are DISTANT BUT CLEAR. There is normal respiratory effort, with equal breath sounds bilaterally, and without pathologic use of accessory muscles. CARDIOVASCULAR: Heart is regular without murmurs, gallops, or rubs. GI: The abdomen is  soft, nontender, and nondistended. There are no palpable masses. There is no hepatosplenomegaly. There are normal bowel sounds in all quadrants. GU: Rectal deferred.   MUSCULOSKELETAL: Normal muscle strength and tone. No cyanosis or edema.   SKIN: Turgor is good and there are no pathologic skin lesions or ulcers. NEUROLOGIC: Motor and sensation is grossly normal. Cranial nerves are grossly intact. PSYCH:  Oriented to person, place and time. Affect is normal.  Data Reviewed  I have personally reviewed the patient's imaging, laboratory findings and medical records.    Assessment /Plan 84 year old female with unresectable 15 mm polyp on the ascending colon.  She does have significant comorbidities to include severe pulmonary fibrosis requiring oxygen and limited performance status.  She is also very fragile.  Discussed with patient in detail about her disease process.  I would like to start a work-up with a CT scan of the abdomen and pelvis  as well as a CEA.  Looking at the endoscopic images this does not seem to be an aggressive colorectal carcinoma and pathology was reassuring. Options for this case will include proceeding with right colectomy after extensive preoperative work-up has been completed versus follow-up endoscopically every 6 months to a year.  The patient currently wishes to avoid any major interventions but is in agreement that further work-up is indicated. Not sure if she will do well with a formal right colectomy given her significant pulmonary status I also discussed with her about referral to either physiatry versus neurology.  She wishes to obtain a neurology consultation due to the numbness on the fourth and fifth fingers  Time spent with the patient was 60 minutes, with more than 50% of the time spent in face-to-face education, counseling and care coordination.    Caroleen Hamman, MD FACS General Surgeon 09/06/2020, 2:04 PM

## 2020-09-07 ENCOUNTER — Telehealth: Payer: Self-pay

## 2020-09-07 NOTE — Telephone Encounter (Signed)
Patient notified of referral being sent to Neurology. Someone from their office will call within 5-7 days, if not she understands to call our office so we can check on this for her.    She was also reminded to go have CBC and CEA done prior to seeing Dr.Pabon. She verbalized understanding. Spoke with [facility] and she will be looking out for the fax to come over and notify the patient.

## 2020-09-08 ENCOUNTER — Other Ambulatory Visit
Admission: RE | Admit: 2020-09-08 | Discharge: 2020-09-08 | Disposition: A | Discharge status: Home / Self Care | Payer: Medicare HMO | Attending: Surgery | Admitting: Surgery

## 2020-09-08 DIAGNOSIS — K6389 Other specified diseases of intestine: Secondary | ICD-10-CM | POA: Insufficient documentation

## 2020-09-08 LAB — CBC WITH DIFFERENTIAL/PLATELET
Abs Immature Granulocytes: 0.03 10*3/uL (ref 0.00–0.07)
Basophils Absolute: 0.1 10*3/uL (ref 0.0–0.1)
Basophils Relative: 1 %
Eosinophils Absolute: 0.7 10*3/uL — ABNORMAL HIGH (ref 0.0–0.5)
Eosinophils Relative: 9 %
HCT: 35.8 % — ABNORMAL LOW (ref 36.0–46.0)
Hemoglobin: 10.8 g/dL — ABNORMAL LOW (ref 12.0–15.0)
Immature Granulocytes: 0 %
Lymphocytes Relative: 16 %
Lymphs Abs: 1.3 10*3/uL (ref 0.7–4.0)
MCH: 24.4 pg — ABNORMAL LOW (ref 26.0–34.0)
MCHC: 30.2 g/dL (ref 30.0–36.0)
MCV: 80.8 fL (ref 80.0–100.0)
Monocytes Absolute: 0.8 10*3/uL (ref 0.1–1.0)
Monocytes Relative: 9 %
Neutro Abs: 5.2 10*3/uL (ref 1.7–7.7)
Neutrophils Relative %: 65 %
Platelets: 307 10*3/uL (ref 150–400)
RBC: 4.43 MIL/uL (ref 3.87–5.11)
RDW: 22.4 % — ABNORMAL HIGH (ref 11.5–15.5)
Smear Review: NORMAL
WBC: 8.1 10*3/uL (ref 4.0–10.5)
nRBC: 0 % (ref 0.0–0.2)

## 2020-09-09 LAB — CEA: CEA: 2.6 ng/mL (ref 0.0–4.7)

## 2020-09-11 ENCOUNTER — Telehealth: Payer: Self-pay

## 2020-09-11 NOTE — Telephone Encounter (Signed)
Notified patient as instructed that her lab work looked good, patient pleased. Discussed follow-up appointments, patient agrees

## 2020-09-12 ENCOUNTER — Telehealth: Payer: Self-pay

## 2020-09-12 NOTE — Telephone Encounter (Signed)
It it okay to order?

## 2020-09-12 NOTE — Telephone Encounter (Signed)
Copied from [place] [id number]. Topic: General - Other >> Sep 12, 2020  3:40 PM Mcneil, Ja-Kwan wrote: Reason for CRM: Pt would like to discuss getting portable oxygen that runs by battery. Pt requests Rx for [employer] oxygen as it will help with her back due to it being light weight. Pt requests call back

## 2020-09-13 ENCOUNTER — Telehealth: Payer: Self-pay

## 2020-09-13 NOTE — Telephone Encounter (Signed)
Patient scheduled to see Neurology 10/02/20 @ 2:30 pm at [facility].

## 2020-09-15 ENCOUNTER — Ambulatory Visit
Admission: RE | Admit: 2020-09-15 | Discharge: 2020-09-15 | Disposition: A | Discharge status: Home / Self Care | Payer: Medicare HMO | Source: Ambulatory Visit | Attending: Surgery | Admitting: Surgery

## 2020-09-15 ENCOUNTER — Other Ambulatory Visit: Payer: Self-pay

## 2020-09-15 DIAGNOSIS — K6389 Other specified diseases of intestine: Secondary | ICD-10-CM | POA: Diagnosis present

## 2020-09-15 MED ORDER — IOHEXOL 300 MG/ML  SOLN
80.0000 mL | Freq: Once | INTRAMUSCULAR | Status: AC | PRN
  Administered 2020-09-15: 80 mL via INTRAVENOUS

## 2020-09-15 NOTE — Telephone Encounter (Signed)
Yes we could see if the company she uses has anything, or send order to [employer]

## 2020-09-15 NOTE — Telephone Encounter (Signed)
Rx faxed to [facility] supply in [place]

## 2020-09-18 ENCOUNTER — Other Ambulatory Visit: Payer: Self-pay

## 2020-09-18 ENCOUNTER — Telehealth: Payer: Self-pay

## 2020-09-18 ENCOUNTER — Encounter: Payer: Self-pay | Admitting: Surgery

## 2020-09-18 ENCOUNTER — Ambulatory Visit (INDEPENDENT_AMBULATORY_CARE_PROVIDER_SITE_OTHER): Payer: Medicare HMO | Admitting: Surgery

## 2020-09-18 VITALS — BP 133/65 | HR 72 | Temp 99.2°F | Ht 59.0 in | Wt 112.6 lb

## 2020-09-18 DIAGNOSIS — Z86010 Personal history of colonic polyps: Secondary | ICD-10-CM | POA: Diagnosis not present

## 2020-09-18 NOTE — Telephone Encounter (Signed)
Per Dr.Pabon advised to close "result tab" as patient is scheduled to come in for a follow up appointment today 09/18/20 at 2:00pm with Dr.Pabon.

## 2020-09-18 NOTE — Telephone Encounter (Signed)
-----   Message from Jules Husbands, MD sent at 09/18/2020  9:06 AM EDT ----- Please let her know the CT scan did not show any big cancers. I will talk in more detail with them in f/u appt ----- Message ----- From: Interface, Rad Results In Sent: 09/17/2020  11:57 AM EDT To: Jules Husbands, MD

## 2020-09-18 NOTE — Patient Instructions (Addendum)
Patient is scheduled for October 8th, 2021 at [facility] arrive at 9:30am for 10:00am Barium Enema. Patient is NOT to have anything to eat or drink after midnight. Patient given Barium Enema Bowel Prep Instructions at today's visit.   Barium Enema A barium enema is a medical test in which X-rays are taken to check for problems in the colon or in the lower part of the small bowel. The colon is also called the large intestine. This procedure is sometimes called a lower gastrointestinal (GI) series. For this test, a white, chalky liquid called barium will be put into your colon through your anus. A series of X-rays will be done while the barium passes through your colon. The barium shows up well on X-rays, making it easier for your health care provider to see the inside of your colon. A barium enema may be done to check for various problems, such as:  Growths in the colon or rectum (polyps).  Abnormal growths of tissue and cells (tumors).  Abnormal pouches (diverticula).  Inflammation of the digestive tract (Crohn's disease).  Other inflammatory bowel diseases (IBD). Your health care provider may recommend this procedure if you have symptoms such as:  Pain in the abdomen.  Blood in the stool (feces). Tell a health care provider about:  Any allergies you have, especially allergies to substances that are used in certain imaging tests (contrast materials).  All medicines you are taking, including vitamins, herbs, eye drops, creams, and over-the-counter medicines.  Any blood disorders you have.  Any surgeries you have had.  Any medical conditions you have, including any rectal pain or any new or increased blood in your stool.  Whether you are pregnant or may be pregnant.  Whether you are breastfeeding. What are the risks? Generally, this is a safe procedure. However, problems may occur, including:  Difficulty passing stool (constipation).  Stool that hardens and gets stuck  in the colon or rectum (fecal impaction).  A hole (perforation) in the colon. What happens before the procedure?  Follow instructions from your health care provider about eating or drinking restrictions. You may be asked to restrict food or fluids before the procedure.  Ask your health care provider about: ? Changing or stopping your regular medicines. This is especially important if you are taking diabetes medicines or blood thinners. ? Taking medicines such as aspirin and ibuprofen. These medicines can thin your blood. Do not take these medicines unless your health care provider tells you to take them. ? Taking over-the-counter medicines, vitamins, herbs, and supplements.  Follow instructions from your health care provider about bowel preparation. This may include: ? Drinking a large amount of medicated liquid, starting the day before your procedure. The liquid will cause you to have multiple loose bowel movements until your stool is almost clear or light green. This cleans out your colon in preparation for the procedure. ? Using a suppository or an enema.  Plan to have someone take you home from the hospital or clinic. What happens during the procedure?   You may be given a medicine to help you relax (sedative).  A small tube (rectal catheter) will be inserted into your rectum.  A balloon on the catheter will be inflated so that it blocks the anal opening. This will help to keep the barium in the colon during the test.  Barium will be put into the colon through the catheter. You may feel some bloating and pressure.  Your health care provider will watch the flow  of barium as it moves through the rectum, the colon, and into the last part of the small bowel. He or she will do this using a series of plain X-rays plus a type of X-ray that allows images to be viewed on a monitor in a movie-like sequence (fluoroscopy).  You may need to roll onto your side, back, and stomach during the exam.  This will allow the health care provider to view your entire lower GI tract.  In some cases, air will be put into the colon through the catheter. This helps to get even better X-rays if necessary.  The barium will be drained out of the colon.  You will be asked to go to the bathroom to expel more barium.  A final X-ray will be taken. The procedure may vary among health care providers and hospitals. What happens after the procedure?  Do not drive for 24 hours if you were given a sedative during your procedure.  Return to your normal activities and diet as told by your health care provider.  Ask your health care provider, or the department that is doing the test: ? When will my results be ready? ? How will I get my results? ? What are my treatment options? ? What other tests do I need? ? What are my next steps? Summary  A barium enema is a medical test in which X-rays are taken to check for problems in the colon or in the lower part of the small bowel. It is sometimes called a lower gastrointestinal (GI) series.  For this test, a white, chalky liquid called barium will be put into your colon through your anus.  This test may be done to check for problems such as tumors, polyps, or inflammatory bowel disease.  Your health care provider will watch the flow of barium as it moves through the rectum, the colon, and into the last part of the small bowel.  Ask your health care provider, or the department that is doing the test, when your results will be ready and how you will get them. This information is not intended to replace advice given to you by your health care provider. Make sure you discuss any questions you have with your health care provider. Document Revised: 04/07/2018 Document Reviewed: 04/07/2018 Elsevier Patient Education  2020 [employer].

## 2020-09-19 ENCOUNTER — Telehealth: Payer: Self-pay | Admitting: Internal Medicine

## 2020-09-19 ENCOUNTER — Encounter: Payer: Self-pay | Admitting: Internal Medicine

## 2020-09-19 ENCOUNTER — Ambulatory Visit: Payer: Medicare HMO | Admitting: Internal Medicine

## 2020-09-19 VITALS — BP 126/72 | HR 72 | Temp 97.8°F | Ht <= 58 in | Wt 111.8 lb

## 2020-09-19 DIAGNOSIS — J9611 Chronic respiratory failure with hypoxia: Secondary | ICD-10-CM | POA: Diagnosis not present

## 2020-09-19 DIAGNOSIS — I5032 Chronic diastolic (congestive) heart failure: Secondary | ICD-10-CM | POA: Diagnosis not present

## 2020-09-19 DIAGNOSIS — R0689 Other abnormalities of breathing: Secondary | ICD-10-CM

## 2020-09-19 DIAGNOSIS — I480 Paroxysmal atrial fibrillation: Secondary | ICD-10-CM

## 2020-09-19 DIAGNOSIS — J449 Chronic obstructive pulmonary disease, unspecified: Secondary | ICD-10-CM

## 2020-09-19 DIAGNOSIS — K922 Gastrointestinal hemorrhage, unspecified: Secondary | ICD-10-CM

## 2020-09-19 MED ORDER — TRELEGY ELLIPTA 100-62.5-25 MCG/INH IN AEPB: 1.0000 | INHALATION_SPRAY | Freq: Every day | RESPIRATORY_TRACT | 0 refills | Status: AC

## 2020-09-19 MED ORDER — ALBUTEROL SULFATE (2.5 MG/3ML) 0.083% IN NEBU: 2.5000 mg | INHALATION_SOLUTION | Freq: Four times a day (QID) | RESPIRATORY_TRACT | 12 refills | Status: DC | PRN

## 2020-09-19 NOTE — Telephone Encounter (Signed)
Order has been placed to [employer] to provide with with POC, nebulizer and mask. Patient is aware and voiced her understanding.  Nothing further needed.

## 2020-09-19 NOTE — Patient Instructions (Addendum)
Continue OXYGEN as prescribed  CONTINUE TRELEGY AS PRESCRIBED  ALBUTEROL NEB every 4 hrs as needed   DME REFERRAL FOR NEB MACHINE WITH MASK

## 2020-09-19 NOTE — Progress Notes (Signed)
Name: Jackie Turner MRN: [id number] DOB: [birth year]     CONSULTATION DATE: 09/19/2020  REFERRING MD : Dante Gang  CHIEF COMPLAINT:ASSESSMENT OF COPD  STUDIES:     CT chest Independently reviewed by Me 07/2020 Negative for  pulmonary embolism. Moderate bilateral pleural effusions and associated atelectasis or consolidation.    HISTORY OF PRESENT ILLNESS:  84 y.o.femalewith a history of COPD, dyslipidemia, atrial fibrillation on Eliquisand osteoporosis, who presented to the ED on  08/04/20 with recurrent dark stools, having recently been started on Eliquis for A-fib in early July. Patient had a second admission for COPD exacerbation and was treated for pneumonia  CT chest was obtained to assess for PE, found to have moderate effusions, No PE   From cardiology respective patient was treated with IV lasix and was transitioned to oral Lasix Patient signs and symptoms of acute diastolic heart failure  From GI standpoint patient did not undergo endoscopic evaluation due to respiratory issues Patient was taken off Eliquis Hemoglobin was stabilized Upper endoscopy did not reveal any type of active bleeding Was taken off Eliquis plan for colonoscopy this week  With assessment of her COPD, patient has had significant exposure to dust chemicals worked in the yard for many many years and this has progressed to COPD in the setting of chronic hypoxic respiratory failure and hypercapnic respiratory failure Patient was treated with noninvasive ventilation with BiPAP and maintained on oxygen therapy She has been on oxygen since July 2021 Using 2 L at baseline  Patient with A. fib RVR most likely due to underlying diastolic heart failure and anemia Patient had a difficult time with rate control during her admission Eliquis was held due to GI bleed and concerns Patient was started on beta-blockers and was to follow-up with cardiology   Overall her prognosis is poor At this time  patient has very poor respiratory insufficiency She is tolerating Trelegy inhaler therapy and says that it helps Patient does not have any nebulized treatment at this time Patient is requesting INOGEN POC for her chronic hypoxic respiratory failure  We will need to address goals of care at next office visit    PAST MEDICAL HISTORY :   has a past medical history of Allergy, Asthma, Hyperlipidemia, and Osteoporosis.  has a past surgical history that includes Abdominal hysterectomy (2003); Tonsillectomy (1961); Eye surgery (Bilateral); Breast biopsy (1976, 1978); carpal tunnel repair (Bilateral); Hand tendon surgery (Right); IR Radiologist Eval & Mgmt (06/13/2017); IR VERTEBROPLASTY CERV/THOR BX INC UNI/BIL INC/INJECT/IMAGING (06/17/2017); Esophagogastroduodenoscopy (N/A, 08/12/2020); and Colonoscopy with propofol (N/A, 08/14/2020). Prior to Admission medications   Medication Sig Start Date End Date Taking? Authorizing Provider  calcium carbonate (CALTRATE 600) 1500 (600 Ca) MG TABS tablet Take 1 tablet by mouth 2 (two) times daily with a meal.  06/22/20  Yes Burnette, Clearnce Sorrel, PA-C  Clobetasol Propionate Emulsion 0.05 % topical foam Apply 1 application topically 2 (two) times daily.  06/22/20  Yes Mar Daring, PA-C  diclofenac Sodium (VOLTAREN) 1 % GEL Apply 4 g topically 4 (four) times daily. 08/03/20  Yes Mar Daring, PA-C  dicyclomine (BENTYL) 10 MG capsule Take 1 capsule (10 mg total) by mouth 3 (three) times daily as needed for spasms. 06/22/20  Yes Mar Daring, PA-C  doxepin (SINEQUAN) 10 MG capsule Take 3 capsules (30 mg total) by mouth at bedtime. 06/22/20  Yes Burnette, Clearnce Sorrel, PA-C  feeding supplement, ENSURE ENLIVE, (ENSURE ENLIVE) LIQD Take 237 mLs by mouth 2 (two) times daily between  meals. 06/16/20  Yes Hongalgi, Lenis Dickinson, MD  ferrous gluconate (FERGON) 324 MG tablet Take 1 tablet (324 mg total) by mouth daily with breakfast. 08/25/20  Yes Burnette, Anderson Malta M, PA-C    fluticasone (FLONASE) 50 MCG/ACT nasal spray Place 1-2 sprays into both nostrils daily.  06/09/20  Yes [provider]  Fluticasone-Umeclidin-Vilant (TRELEGY ELLIPTA) 100-62.5-25 MCG/INH AEPB Inhale 1 puff into the lungs daily. 06/22/20  Yes Mar Daring, PA-C  furosemide (LASIX) 40 MG tablet Take 1 tablet (40 mg total) by mouth daily. 08/24/20  Yes Mar Daring, PA-C  metoprolol tartrate (LOPRESSOR) 25 MG tablet Take 1 tablet (25 mg total) by mouth 2 (two) times daily. 08/15/20  Yes Nicole Kindred A, DO  nystatin (MYCOSTATIN) 100000 UNIT/ML suspension TAKE 5 MLS BY MOUTH 4 TIMES A DAY. Patient taking differently: Take 5 mLs by mouth 4 (four) times daily.  06/22/20  Yes Mar Daring, PA-C  oxybutynin (DITROPAN XL) 15 MG 24 hr tablet Take 1 tablet (15 mg total) by mouth at bedtime. 06/22/20  Yes Mar Daring, PA-C  pentosan polysulfate (ELMIRON) 100 MG capsule Take 1 capsule (100 mg total) by mouth in the morning and at bedtime. Patient taking differently: Take 100 mg by mouth 2 (two) times daily as needed (urinary pain).  06/22/20  Yes Burnette, Jennifer M, PA-C  ULTRAM 50 MG tablet Take 1 tablet (50 mg total) by mouth every 6 (six) hours as needed. Patient taking differently: Take 50 mg by mouth every 6 (six) hours as needed for moderate pain.  06/02/20  Yes Mar Daring, PA-C  Urea 40 % LOTN Apply 1 application topically daily as needed (dry skin).  06/22/20  Yes Burnette, Jennifer M, PA-C  VENTOLIN HFA 108 (90 Base) MCG/ACT inhaler INHALE TWO PUFFS INTO THE LUNGS EVERY 4 HOURS AS NEEDED FOR WHEEZING OR SHORTNESS OF BREATH Patient taking differently: Inhale 2 puffs into the lungs every 4 (four) hours as needed for wheezing or shortness of breath.  06/22/20  Yes Mar Daring, PA-C  vitamin B-12 1000 MCG tablet Take 1 tablet (1,000 mcg total) by mouth daily. 08/16/20  Yes Ezekiel Slocumb, DO   Allergies  Allergen Reactions   Nsaids Other (See Comments)     History of gastric ulcer   Amoxicillin Nausea Only and Rash   Ibandronic Acid Other (See Comments)    Muscle weakness - advised not to take  **BONIVA** - drug name   Actonel  [Risedronate] Other (See Comments)    Gastric ulcers   Antihistamines, Chlorpheniramine-Type    Aspirin     Gastric ulcer   Buspar [Buspirone] Other (See Comments)    Pt does not remember reaction    Butalbital-Aspirin-Caffeine Other (See Comments)    Other reaction(s): Unknown   Clarithromycin Other (See Comments)    Bloating  *BIAXIN*   Codeine    Gatifloxacin Other (See Comments)   Hydrocodone-Guaifenesin Nausea Only    CODICLEAR DH STYRUP    Moxifloxacin    Oxycodone Other (See Comments)    Dizziness   Penicillins Other (See Comments)   Risedronate Sodium     Gastric ulcer   Tylenol With Codeine #3  [Acetaminophen-Codeine]     Other reaction(s): Unknown   Raloxifene Other (See Comments)    Bloating Bloating  *EVISTA*    FAMILY HISTORY:  family history includes Breast cancer in her sister; CAD in her mother; Cancer in her sister; Congestive Heart Failure in her mother; Heart disease in  her mother; Osteoarthritis in her mother. SOCIAL HISTORY:  reports that she has never smoked. She has never used smokeless tobacco. She reports that she does not drink alcohol and does not use drugs.    Review of Systems:  Gen:  Denies  fever, sweats, chills weigh loss  HEENT: Denies blurred vision, double vision, ear pain, eye pain, hearing loss, nose bleeds, sore throat Cardiac:  No dizziness, chest pain or heaviness, chest tightness,edema, No JVD Resp:   No cough, -sputum production, +shortness of breath,-wheezing, -hemoptysis, +DOE Gi: Denies swallowing difficulty, stomach pain, nausea or vomiting, diarrhea, constipation, bowel incontinence Gu:  Denies bladder incontinence, burning urine Ext:   Denies Joint pain, stiffness or swelling Skin: Denies  skin rash, easy bruising or bleeding  or hives Endoc:  Denies polyuria, polydipsia , polyphagia or weight change Psych:   Denies depression, insomnia or hallucinations  Other:  All other systems negative    BP 126/72 (BP Location: Left Arm, Patient Position: Sitting, Cuff Size: Normal)    Pulse 72    Temp 97.8 F (36.6 C) (Temporal)    Ht 4\' 10"  (1.473 m)    Wt 111 lb 12.8 oz (50.7 kg)    SpO2 95%    BMI 23.37 kg/m     SpO2: 95 % O2 Device: Nasal cannula O2 Flow Rate (L/min): 2 L/min O2 Type: Continuous O2    Physical Examination:   GENERAL:+ fatigue ill appearing, frial HEAD: Normocephalic, atraumatic.  EYES: PERLA, EOMI No scleral icterus.  MOUTH: Moist mucosal membrane.  EAR, NOSE, THROAT: Clear without exudates. No external lesions.  NECK: Supple.  PULMONARY: CTA B/L +crackles a bases CARDIOVASCULAR: S1 and S2. Regular rate and rhythm. No murmurs GASTROINTESTINAL: Soft, nontender, nondistended. Positive bowel sounds.  MUSCULOSKELETAL: No swelling, clubbing, or edema.  NEUROLOGIC: No gross focal neurological deficits. 5/5 strength all extremities SKIN: No ulceration, lesions, rashes, or cyanosis.  PSYCHIATRIC: Insight, judgment intact. -depression -anxiety ALL OTHER ROS ARE NEGATIVE   MEDICATIONS: I have reviewed all medications and confirmed regimen as documented      ASSESSMENT AND PLAN SYNOPSIS  84 year old pleasant white female thin frail with chronic hypoxic respiratory failure from underlying COPD which seems to be at Gold stage D at this time in the setting of chronic diastolic heart failure with very poor respiratory insufficiency  COPD Gold stage D Recommend continuation of Trelegy inhaler therapy as this seems to be helping We will add nebulized albuterol to her regimen every 4 hours as needed DME company referral for nebulizer machine with mask No indication for prednisone at this time or antibiotics At some point in time patient will be intolerant to inhaler therapy and will likely need  nebulized therapy as maintenance therapy  Diastolic heart failure Continue Lasix as prescribed Follow-up with cardiology  Chronic hypoxic respiratory failure Patient will need oxygen indefinitely She uses and benefits from therapy and needs this for survival  Overall condition is poor with progressive and worsening respiratory disease along with cardiac compromise in the setting of advanced age, I will need to address goals of care next office visit  GI bleed Anticoagulation is on hold Colonoscopy pending  COVID-19 EDUCATION: The signs and symptoms of COVID-19 were discussed with the patient and how to seek care for testing.  The importance of social distancing was discussed today. Hand Washing Techniques and avoid touching face was advised.     MEDICATION ADJUSTMENTS/LABS AND TESTS ORDERED: Continue OXYGEN as prescribed  CONTINUE TRELEGY AS PRESCRIBED  ALBUTEROL NEB  every 4 hrs as needed   DME REFERRAL FOR NEB MACHINE WITH MASK   CURRENT MEDICATIONS REVIEWED AT LENGTH WITH PATIENT TODAY   Patient satisfied with Plan of action and management. All questions answered  Follow up in 3 months  Total time spent with patient 62 minutes   Annabeth Tortora Patricia Pesa, M.D.  [facility]  Medical Director [place] Director [facility] Cardio-Pulmonary Department

## 2020-09-20 ENCOUNTER — Encounter: Payer: Self-pay | Admitting: Surgery

## 2020-09-20 NOTE — Progress Notes (Signed)
Outpatient Surgical Follow Up  09/20/2020  Jackie Turner is an 84 y.o. female.   Chief Complaint  Patient presents with  . Follow-up    Follow up: Colon mass- discuss Labs, CT A/P    HPI: 84 year old female with unknown ileocecal valve tubular adenoma that measures 15 mm.  He does have significant COPD and is home oxygen dependent.  I performed a previous CVA that was normal and a CT scan of the abdomen and pelvis to make sure she did not have anything that was grossly concerning for adenocarcinoma.  I personally reviewed the CT scan and there is no evidence of any colon masses.  She continues to have dyspnea on exertion and continues to wear oxygen.  She does have an upcoming appointment with pulmonary medicine  Past Medical History:  Diagnosis Date  . Allergy   . Asthma   . Hyperlipidemia   . Osteoporosis     Past Surgical History:  Procedure Laterality Date  . ABDOMINAL HYSTERECTOMY  2003  . BREAST BIOPSY  1976, 1978   benign  . carpal tunnel repair Bilateral    R 1990; L 1997  . COLONOSCOPY WITH PROPOFOL N/A 08/14/2020   Procedure: COLONOSCOPY WITH PROPOFOL;  Surgeon: Lucilla Lame, MD;  Location[facility] ENDOSCOPY;  Service: Endoscopy;  Laterality: N/A;  . ESOPHAGOGASTRODUODENOSCOPY N/A 08/12/2020   Procedure: ESOPHAGOGASTRODUODENOSCOPY (EGD);  Surgeon: Lin Landsman, MD;  Location: [facility] ENDOSCOPY;  Service: Gastroenterology;  Laterality: N/A;  . EYE SURGERY Bilateral    cataract extraction  . HAND TENDON SURGERY Right    Trigger finger  . IR RADIOLOGIST EVAL & MGMT  06/13/2017  . IR VERTEBROPLASTY CERV/THOR BX INC UNI/BIL INC/INJECT/IMAGING  06/17/2017  . TONSILLECTOMY  1961    Family History  Problem Relation Age of Onset  . Heart disease Mother   . CAD Mother   . Congestive Heart Failure Mother   . Osteoarthritis Mother   . Cancer Sister        breast  . Breast cancer Sister     Social History:  reports that she has never smoked. She has never used  smokeless tobacco. She reports that she does not drink alcohol and does not use drugs.  Allergies:  Allergies  Allergen Reactions  . Nsaids Other (See Comments)    History of gastric ulcer  . Amoxicillin Nausea Only and Rash  . Ibandronic Acid Other (See Comments)    Muscle weakness - advised not to take  **BONIVA** - drug name  . Actonel  [Risedronate] Other (See Comments)    Gastric ulcers  . Antihistamines, Chlorpheniramine-Type   . Aspirin     Gastric ulcer  . Buspar [Buspirone] Other (See Comments)    Pt does not remember reaction   . Butalbital-Aspirin-Caffeine Other (See Comments)    Other reaction(s): Unknown  . Clarithromycin Other (See Comments)    Bloating  *BIAXIN*  . Codeine   . Gatifloxacin Other (See Comments)  . Hydrocodone-Guaifenesin Nausea Only    CODICLEAR DH STYRUP   . Moxifloxacin   . Oxycodone Other (See Comments)    Dizziness  . Penicillins Other (See Comments)  . Risedronate Sodium     Gastric ulcer  . Tylenol With Codeine #3  [Acetaminophen-Codeine]     Other reaction(s): Unknown  . Raloxifene Other (See Comments)    Bloating Bloating  *EVISTA*    Medications reviewed.    ROS Full ROS performed and is otherwise negative other than what is stated in HPI  BP 133/65   Pulse 72   Temp 99.2 F (37.3 C) (Oral)   Ht 4\' 11"  (1.499 m)   Wt 112 lb 9.6 oz (51.1 kg)   SpO2 95%   BMI 22.74 kg/m   Physical Exam Vitals and nursing note reviewed. Exam conducted with a chaperone present.  Constitutional:      Appearance: Normal appearance.  Cardiovascular:     Rate and Rhythm: Normal rate. Rhythm irregular.  Pulmonary:     Effort: Respiratory distress present.     Breath sounds: Normal breath sounds. No wheezing or rhonchi.     Comments: Distant breath sounds.  She is wearing oxygen. Abdominal:     General: Abdomen is flat. There is no distension.     Palpations: Abdomen is soft. There is no mass.     Tenderness: There is no  abdominal tenderness. There is no guarding.     Hernia: No hernia is present.  Musculoskeletal:        General: No swelling or tenderness. Normal range of motion.     Cervical back: Normal range of motion and neck supple. No rigidity or tenderness.  Skin:    General: Skin is warm and dry.     Capillary Refill: Capillary refill takes less than 2 seconds.  Neurological:     General: No focal deficit present.     Mental Status: She is alert and oriented to person, place, and time.  Psychiatric:        Mood and Affect: Mood normal.        Behavior: Behavior normal.        Thought Content: Thought content normal.        Judgment: Judgment normal.     Assessment/Plan: 84 year old female with unresectable polyp consistent with a tubular adenoma who in a patient with significant COPD chronic respiratory failure on home oxygen and significant fragility.  There is no evidence of any overt colon cancer or metastatic disease.  I am still very hesitant about any potential surgical interventions as I do not necessarily think that she may be able to tolerate them.  Discussed with the patient in detail and alternative will be to obtain a barium enema to make sure there is no other large polyps that may need to be addressed.  I will see her back after she completes the study.  She does have an upcoming consult with pulmonary and I will give [place] a little bit better idea about her pulmonary status.    Greater than 50% of the 30 minutes  visit was spent in counseling/coordination of care   Caroleen Hamman, MD [place] [facility]

## 2020-09-22 ENCOUNTER — Ambulatory Visit: Payer: Self-pay | Admitting: Physician Assistant

## 2020-09-22 ENCOUNTER — Ambulatory Visit
Admission: RE | Admit: 2020-09-22 | Discharge: 2020-09-22 | Disposition: A | Discharge status: Home / Self Care | Payer: Medicare HMO | Source: Ambulatory Visit | Attending: Surgery | Admitting: Surgery

## 2020-09-22 ENCOUNTER — Other Ambulatory Visit: Payer: Self-pay

## 2020-09-22 DIAGNOSIS — Z86010 Personal history of colonic polyps: Secondary | ICD-10-CM | POA: Insufficient documentation

## 2020-09-25 ENCOUNTER — Telehealth: Payer: Self-pay

## 2020-09-25 NOTE — Telephone Encounter (Signed)
Patient was notified of recent lab results per Dr.Pabon and patient asked if there was anything concerning on her recent exam. I informed patient that there was nothing concerning per Dr.Pabon seen on exam. Notified patient of her scheduled upcoming appointment for the first week of November. Patient verbalized understanding and has no further questions.

## 2020-09-25 NOTE — Telephone Encounter (Signed)
-----   Message from Jules Husbands, MD sent at 09/23/2020 11:34 AM EDT ----- Please let her know no large polyps or cancers were seen on exam ----- Message ----- From: Interface, Rad Results In Sent: 09/22/2020   2:51 PM EDT To: Jules Husbands, MD

## 2020-10-05 ENCOUNTER — Telehealth: Payer: Self-pay | Admitting: Internal Medicine

## 2020-10-05 ENCOUNTER — Other Ambulatory Visit: Payer: Self-pay | Admitting: Physician Assistant

## 2020-10-05 DIAGNOSIS — M48061 Spinal stenosis, lumbar region without neurogenic claudication: Secondary | ICD-10-CM

## 2020-10-05 DIAGNOSIS — M5416 Radiculopathy, lumbar region: Secondary | ICD-10-CM

## 2020-10-05 DIAGNOSIS — J449 Chronic obstructive pulmonary disease, unspecified: Secondary | ICD-10-CM

## 2020-10-05 DIAGNOSIS — S22000D Wedge compression fracture of unspecified thoracic vertebra, subsequent encounter for fracture with routine healing: Secondary | ICD-10-CM

## 2020-10-05 NOTE — Telephone Encounter (Signed)
Yes please

## 2020-10-05 NOTE — Telephone Encounter (Signed)
Requested medication (s) are due for refill today: yes  Requested medication (s) are on the active medication list:yes  Last refill:  06/02/20  #90  5 refills  Future visit scheduled: No  Notes to clinic:  Not delegated    Requested Prescriptions  Pending Prescriptions Disp Refills   traMADol (ULTRAM) 50 MG tablet [Pharmacy Med Name: TRAMADOL HCL 50 MG TAB] 90 tablet     Sig: TAKE 1 TABLET BY MOUTH EVERY 8 HOURS AS NEEDED FOR PAIN      Not Delegated - Analgesics:  Opioid Agonists Failed - 10/05/2020  4:39 PM      Failed - This refill cannot be delegated      Failed - Urine Drug Screen completed in last 360 days.      Passed - Valid encounter within last 6 months    Recent Outpatient Visits           1 month ago Anemia, unspecified type   [place], [place]   2 months ago Daytime somnolence   [place], [place]   2 months ago Panic attacks   [place], [place]   3 months ago Atrial fibrillation with rapid ventricular response [facility])   [place], [place]   4 months ago Age-related osteoporosis with current pathological fracture with routine healing, subsequent encounter   [facility], [place], [place]

## 2020-10-05 NOTE — Telephone Encounter (Signed)
Order for POC has been sent to [facility].  Patient is aware and voiced her understanding.  Nothing further is needed.

## 2020-10-05 NOTE — Telephone Encounter (Signed)
Called and spoke to patient, who is requesting POC.  Dr. Mortimer Fries, please advise if okay to order? Thanks

## 2020-10-16 ENCOUNTER — Ambulatory Visit: Payer: Medicare HMO | Admitting: Surgery

## 2020-10-16 ENCOUNTER — Other Ambulatory Visit: Payer: Self-pay

## 2020-10-16 ENCOUNTER — Encounter: Payer: Self-pay | Admitting: Surgery

## 2020-10-16 VITALS — BP 130/71 | HR 74 | Temp 98.6°F | Ht 59.0 in | Wt 113.0 lb

## 2020-10-16 DIAGNOSIS — K6389 Other specified diseases of intestine: Secondary | ICD-10-CM | POA: Diagnosis not present

## 2020-10-16 NOTE — Progress Notes (Signed)
Outpatient Surgical Follow Up  10/16/2020  Jackie Turner is an 84 y.o. female.   Chief Complaint  Patient presents with  . Follow-up    colon mass    HPI: 84 year old female with a known ileocecal valve tubular adenoma that measures 15 mm.  SHe does have significant COPD and is home oxygen dependent.   I personally reviewed the CT scan and barium enema there is no evidence of any colon masses.  She continues to have dyspnea on exertion and continues to wear oxygen.  pulmonary medicine  has seen her and she continues to struggle with her lungs  Past Medical History:  Diagnosis Date  . Allergy   . Asthma   . Hyperlipidemia   . Osteoporosis     Past Surgical History:  Procedure Laterality Date  . ABDOMINAL HYSTERECTOMY  2003  . BREAST BIOPSY  1976, 1978   benign  . carpal tunnel repair Bilateral    R 1990; L 1997  . COLONOSCOPY WITH PROPOFOL N/A 08/14/2020   Procedure: COLONOSCOPY WITH PROPOFOL;  Surgeon: Lucilla Lame, MD;  Location: [facility] ENDOSCOPY;  Service: Endoscopy;  Laterality: N/A;  . ESOPHAGOGASTRODUODENOSCOPY N/A 08/12/2020   Procedure: ESOPHAGOGASTRODUODENOSCOPY (EGD);  Surgeon: Lin Landsman, MD;  Location: [facility] ENDOSCOPY;  Service: Gastroenterology;  Laterality: N/A;  . EYE SURGERY Bilateral    cataract extraction  . HAND TENDON SURGERY Right    Trigger finger  . IR RADIOLOGIST EVAL & MGMT  06/13/2017  . IR VERTEBROPLASTY CERV/THOR BX INC UNI/BIL INC/INJECT/IMAGING  06/17/2017  . TONSILLECTOMY  1961    Family History  Problem Relation Age of Onset  . Heart disease Mother   . CAD Mother   . Congestive Heart Failure Mother   . Osteoarthritis Mother   . Cancer Sister        breast  . Breast cancer Sister     Social History:  reports that she has never smoked. She has never used smokeless tobacco. She reports that she does not drink alcohol and does not use drugs.  Allergies:  Allergies  Allergen Reactions  . Nsaids Other (See Comments)     History of gastric ulcer  . Amoxicillin Nausea Only and Rash  . Ibandronic Acid Other (See Comments)    Muscle weakness - advised not to take  **BONIVA** - drug name  . Actonel  [Risedronate] Other (See Comments)    Gastric ulcers  . Antihistamines, Chlorpheniramine-Type   . Aspirin     Gastric ulcer  . Buspar [Buspirone] Other (See Comments)    Pt does not remember reaction   . Butalbital-Aspirin-Caffeine Other (See Comments)    Other reaction(s): Unknown  . Clarithromycin Other (See Comments)    Bloating  *BIAXIN*  . Codeine   . Gatifloxacin Other (See Comments)  . Hydrocodone-Guaifenesin Nausea Only    CODICLEAR DH STYRUP   . Moxifloxacin   . Oxycodone Other (See Comments)    Dizziness  . Penicillins Other (See Comments)  . Risedronate Sodium     Gastric ulcer  . Tylenol With Codeine #3  [Acetaminophen-Codeine]     Other reaction(s): Unknown  . Raloxifene Other (See Comments)    Bloating Bloating  *EVISTA*    Medications reviewed.    ROS Full ROS performed and is otherwise negative other than what is stated in HPI   BP 130/71   Pulse 74   Temp 98.6 F (37 C)   Ht 4\' 11"  (1.499 m)   Wt 113 lb (51.3 kg)  SpO2 95%   BMI 22.82 kg/m   Physical Exam Wearing high flow O2 Chest: decrease bs bilaterally.  CV irregular rhythm,w systolic murmur Abd: soft, nt, no masses     Assessment/Plan: Serrated Tubular adenoma 15 mm cecum on a debilitated pt w poor performance status. We will see her in 9 months and consider repeating colonoscopy at that time. D/W her about potential risks and benefits of colectomy. She wishes to wait for now Greater than 50% of the 25 minutes  visit was spent in counseling/coordination of care   Caroleen Hamman, MD [place] [facility]

## 2020-10-16 NOTE — Patient Instructions (Addendum)
We will see you back here in 9 months to discuss having a repeat colonoscopy.    Please call and ask to speak with a nurse if you develop questions or concerns.

## 2020-10-25 NOTE — Progress Notes (Signed)
Subjective:   Jackie Turner is a 84 y.o. female who presents for Medicare Annual (Subsequent) preventive examination.  I connected with Hiedi Touchton today by telephone and verified that I am speaking with the correct person using two identifiers. Location patient: home Location provider: work Persons participating in the virtual visit: patient, provider.   I discussed the limitations, risks, security and privacy concerns of performing an evaluation and management service by telephone and the availability of in person appointments. I also discussed with the patient that there may be a patient responsible charge related to this service. The patient expressed understanding and verbally consented to this telephonic visit.    Interactive audio and video telecommunications were attempted between this provider and patient, however failed, due to patient having technical difficulties OR patient did not have access to video capability.  We continued and completed visit with audio only.   Review of Systems    N/A  Cardiac Risk Factors include: advanced age (>33men, >17 women);dyslipidemia;hypertension     Objective:    There were no vitals filed for this visit. There is no height or weight on file to calculate BMI.  Advanced Directives 10/26/2020 08/05/2020 08/04/2020 06/12/2020 06/12/2020 09/28/2019 05/01/2018  Does Patient Have a Medical Advance Directive? Yes No No No No Yes No  Type of Paramedic of [place];Living will - - - - Press photographer;Living will -  Copy of [place] in Chart? No - copy requested - - - - No - copy requested -  Would patient like information on creating a medical advance directive? - No - Patient declined No - Patient declined No - Patient declined No - Patient declined - No - Patient declined    Current Medications (verified) Outpatient Encounter Medications as of 10/26/2020  Medication Sig  .  albuterol (PROVENTIL) (2.5 MG/3ML) 0.083% nebulizer solution Take 3 mLs (2.5 mg total) by nebulization every 6 (six) hours as needed for wheezing or shortness of breath.  . calcium carbonate (CALTRATE 600) 1500 (600 Ca) MG TABS tablet Take 1 tablet by mouth 2 (two) times daily with a meal.   . dicyclomine (BENTYL) 10 MG capsule Take 1 capsule (10 mg total) by mouth 3 (three) times daily as needed for spasms.  Marland Kitchen doxepin (SINEQUAN) 10 MG capsule Take 3 capsules (30 mg total) by mouth at bedtime.  . fluticasone (FLONASE) 50 MCG/ACT nasal spray Place 1-2 sprays into both nostrils daily.   . Fluticasone-Umeclidin-Vilant (TRELEGY ELLIPTA) 100-62.5-25 MCG/INH AEPB Inhale 1 puff into the lungs daily.  . furosemide (LASIX) 40 MG tablet Take 1 tablet (40 mg total) by mouth daily.  . metoprolol tartrate (LOPRESSOR) 25 MG tablet Take 1 tablet (25 mg total) by mouth 2 (two) times daily.  Marland Kitchen oxybutynin (DITROPAN XL) 15 MG 24 hr tablet Take 1 tablet (15 mg total) by mouth at bedtime.  . pentosan polysulfate (ELMIRON) 100 MG capsule Take 1 capsule (100 mg total) by mouth in the morning and at bedtime. (Patient taking differently: Take 100 mg by mouth 2 (two) times daily as needed (urinary pain). )  . traMADol (ULTRAM) 50 MG tablet TAKE 1 TABLET BY MOUTH EVERY 8 HOURS AS NEEDED FOR PAIN  . VENTOLIN HFA 108 (90 Base) MCG/ACT inhaler INHALE TWO PUFFS INTO THE LUNGS EVERY 4 HOURS AS NEEDED FOR WHEEZING OR SHORTNESS OF BREATH (Patient taking differently: Inhale 2 puffs into the lungs every 4 (four) hours as needed for wheezing or shortness of  breath. )  . Clobetasol Propionate Emulsion 0.05 % topical foam Apply 1 application topically 2 (two) times daily.   . diclofenac Sodium (VOLTAREN) 1 % GEL Apply 4 g topically 4 (four) times daily. (Patient not taking: Reported on 10/26/2020)  . feeding supplement, ENSURE ENLIVE, (ENSURE ENLIVE) LIQD Take 237 mLs by mouth 2 (two) times daily between meals. (Patient not taking: Reported  on 10/26/2020)  . ferrous gluconate (FERGON) 324 MG tablet Take 1 tablet (324 mg total) by mouth daily with breakfast. (Patient not taking: Reported on 10/26/2020)  . nystatin (MYCOSTATIN) 100000 UNIT/ML suspension TAKE 5 MLS BY MOUTH 4 TIMES A DAY. (Patient not taking: Reported on 10/26/2020)  . Urea 40 % LOTN Apply 1 application topically daily as needed (dry skin).   . vitamin B-12 1000 MCG tablet Take 1 tablet (1,000 mcg total) by mouth daily. (Patient not taking: Reported on 10/26/2020)   No facility-administered encounter medications on file as of 10/26/2020.    Allergies (verified) Nsaids; Amoxicillin; Ibandronic acid; Actonel  [risedronate]; Antihistamines, chlorpheniramine-type; Aspirin; Buspar [buspirone]; Butalbital-aspirin-caffeine; Clarithromycin; Codeine; Gatifloxacin; Hydrocodone-guaifenesin; Moxifloxacin; Oxycodone; Penicillins; Risedronate sodium; Tylenol with codeine #3  [acetaminophen-codeine]; and Raloxifene   History: Past Medical History:  Diagnosis Date  . Allergy   . Asthma   . Hyperlipidemia   . Osteoporosis    Past Surgical History:  Procedure Laterality Date  . ABDOMINAL HYSTERECTOMY  2003  . BREAST BIOPSY  1976, 1978   benign  . carpal tunnel repair Bilateral    R 1990; L 1997  . COLONOSCOPY WITH PROPOFOL N/A 08/14/2020   Procedure: COLONOSCOPY WITH PROPOFOL;  Surgeon: Lucilla Lame, MD;  Location: [facility] ENDOSCOPY;  Service: Endoscopy;  Laterality: N/A;  . ESOPHAGOGASTRODUODENOSCOPY N/A 08/12/2020   Procedure: ESOPHAGOGASTRODUODENOSCOPY (EGD);  Surgeon: Lin Landsman, MD;  Location: [facility] ENDOSCOPY;  Service: Gastroenterology;  Laterality: N/A;  . EYE SURGERY Bilateral    cataract extraction  . HAND TENDON SURGERY Right    Trigger finger  . IR RADIOLOGIST EVAL & MGMT  06/13/2017  . IR VERTEBROPLASTY CERV/THOR BX INC UNI/BIL INC/INJECT/IMAGING  06/17/2017  . TONSILLECTOMY  1961   Family History  Problem Relation Age of Onset  . Heart disease Mother     . CAD Mother   . Congestive Heart Failure Mother   . Osteoarthritis Mother   . Cancer Sister        breast  . Breast cancer Sister    Social History   Socioeconomic History  . Marital status: Widowed    Spouse name: [name]  . Number of children: 0  . Years of education: H/S  . Highest education level: 12th grade  Occupational History  . Occupation: Retired  Tobacco Use  . Smoking status: Never Smoker  . Smokeless tobacco: Never Used  Vaping Use  . Vaping Use: Never used  Substance and Sexual Activity  . Alcohol use: No  . Drug use: No  . Sexual activity: Never  Other Topics Concern  . Not on file  Social History Narrative  . Not on file   Social Determinants of Health   Financial Resource Strain: Low Risk   . Difficulty of Paying Living Expenses: Not hard at all  Food Insecurity: No Food Insecurity  . Worried About Charity fundraiser in the Last Year: Never true  . Ran Out of Food in the Last Year: Never true  Transportation Needs: No Transportation Needs  . Lack of Transportation (Medical): No  . Lack of Transportation (Non-Medical): No  Physical Activity: Inactive  . Days of Exercise per Week: 0 days  . Minutes of Exercise per Session: 0 min  Stress: No Stress Concern Present  . Feeling of Stress : Not at all  Social Connections: Socially Isolated  . Frequency of Communication with Friends and Family: More than three times a week  . Frequency of Social Gatherings with Friends and Family: More than three times a week  . Attends Religious Services: Never  . Active Member of Clubs or Organizations: No  . Attends Archivist Meetings: Never  . Marital Status: Widowed    Tobacco Counseling Counseling given: Not Answered   Clinical Intake:  Pre-visit preparation completed: Yes  Pain : No/denies pain     Nutritional Risks: None Diabetes: No  How often do you need to have someone help you when you read instructions, pamphlets, or other  written materials from your doctor or pharmacy?: 1 - Never  Diabetic? No  Interpreter Needed?: No  Information entered by :: [facility], LPN   Activities of Daily Living In your present state of health, do you have any difficulty performing the following activities: 10/26/2020 08/05/2020  Hearing? N N  Vision? N N  Difficulty concentrating or making decisions? N N  Walking or climbing stairs? N N  Dressing or bathing? N N  Doing errands, shopping? N N  Preparing Food and eating ? N -  Using the Toilet? N -  In the past six months, have you accidently leaked urine? N -  Do you have problems with loss of bowel control? N -  Managing your Medications? N -  Managing your Finances? N -  Housekeeping or managing your Housekeeping? N -  Some recent data might be hidden    Patient Care Team: Mar Daring, PA-C as PCP - General (Family Medicine) Domingo Pulse, MD as Consulting Physician (Urology) Solum, Betsey Holiday, MD as Physician Assistant (Endocrinology) Theresa Duty, MD (Radiology) Jules Husbands, MD as Consulting Physician (General Surgery) Flora Lipps, MD as Consulting Physician (Pulmonary Disease) Isaias Cowman, MD as Consulting Physician (Cardiology) Dingeldein, Remo Lipps, MD (Ophthalmology)  Indicate any recent Medical Services you may have received from other than [facility] providers in the past year (date may be approximate).     Assessment:   This is a routine wellness examination for Madelaine.  Hearing/Vision screen No exam data present  Dietary issues and exercise activities discussed: Current Exercise Habits: The patient does not participate in regular exercise at present, Exercise limited by: orthopedic condition(s);respiratory conditions(s)  Goals    . DIET - INCREASE WATER INTAKE     Recommend increasing water intake to 6-8 8 oz glasses a day.     . Have 3 meals a day     Recommend eating 3 healthy meals in a day.       Depression Screen PHQ 2/9  Scores 10/26/2020 09/28/2019 05/01/2018 05/01/2018 04/24/2017 04/24/2017 04/22/2016  PHQ - 2 Score 0 0 0 0 0 0 0  PHQ- 9 Score - - 0 - 0 - -    Fall Risk Fall Risk  10/26/2020 09/18/2020 09/04/2020 09/04/2020 09/28/2019  Falls in the past year? 0 0 0 0 0  Number falls in past yr: 0 0 - 0 0  Injury with Fall? 0 0 - 0 0    Any stairs in or around the home? No  If so, are there any without handrails? No  Home free of loose throw rugs in walkways, pet beds,  electrical cords, etc? Yes  Adequate lighting in your home to reduce risk of falls? Yes   ASSISTIVE DEVICES UTILIZED TO PREVENT FALLS:  Life alert? No  Use of a cane, walker or w/c? No  Grab bars in the bathroom? Yes  Shower chair or bench in shower? Yes  Elevated toilet seat or a handicapped toilet? No    Cognitive Function: Declined today.      6CIT Screen 09/28/2019 04/24/2017  What Year? 0 points 0 points  What month? 0 points 0 points  What time? 0 points 0 points  Count back from 20 0 points 0 points  Months in reverse 0 points 0 points  Repeat phrase 0 points 2 points  Total Score 0 2    Immunizations Immunization History  Administered Date(s) Administered  . Fluad Quad(high Dose 65+) 11/10/2019  . Influenza Split 09/25/2009  . Influenza, High Dose Seasonal PF 09/27/2015, 10/21/2016, 09/22/2017, 09/25/2018  . PFIZER SARS-COV-2 Vaccination 01/06/2020, 01/27/2020  . Pneumococcal Conjugate-13 02/09/2015  . Pneumococcal Polysaccharide-23 10/07/2003  . Td 05/14/2007    TDAP status: Due, Education has been provided regarding the importance of this vaccine. Advised may receive this vaccine at local pharmacy or Health Dept. Aware to provide a copy of the vaccination record if obtained from local pharmacy or Health Dept. Verbalized acceptance and understanding. Flu Vaccine status: Declined, Education has been provided regarding the importance of this vaccine but patient still declined. Advised may receive this vaccine at local  pharmacy or Health Dept. Aware to provide a copy of the vaccination record if obtained from local pharmacy or Health Dept. Verbalized acceptance and understanding. Pneumococcal vaccine status: Up to date Covid-19 vaccine status: Completed vaccines  Qualifies for Shingles Vaccine? Yes   Zostavax completed No   Shingrix Completed?: No.    Education has been provided regarding the importance of this vaccine. Patient has been advised to call insurance company to determine out of pocket expense if they have not yet received this vaccine. Advised may also receive vaccine at local pharmacy or Health Dept. Verbalized acceptance and understanding.  Screening Tests Health Maintenance  Topic Date Due  . INFLUENZA VACCINE  07/16/2020  . MAMMOGRAM  08/04/2020  . TETANUS/TDAP  10/26/2021 (Originally 05/13/2017)  . DEXA SCAN  06/21/2022  . COVID-19 Vaccine  Completed  . PNA vac Low Risk Adult  Completed    Health Maintenance  Health Maintenance Due  Topic Date Due  . INFLUENZA VACCINE  07/16/2020  . MAMMOGRAM  08/04/2020    Colorectal cancer screening: No longer required.  Mammogram status: Currently due. Pt to call and schedule an apt.  Bone Density status: Completed 06/21/20. Results reflect: Bone density results: OSTEOPOROSIS. Repeat every 2 years.  Lung Cancer Screening: (Low Dose CT Chest recommended if Age 92-80 years, 30 pack-year currently smoking OR have quit w/in 15years.) does not qualify.   Additional Screening:  Vision Screening: Recommended annual ophthalmology exams for early detection of glaucoma and other disorders of the eye. Is the patient up to date with their annual eye exam?  Yes  Who is the provider or what is the name of the office in which the patient attends annual eye exams? Dr Dingeldein @ [place] If pt is not established with a provider, would they like to be referred to a provider to establish care? No .   Dental Screening: Recommended annual dental exams for proper  oral hygiene  Community Resource Referral / Chronic Care Management: CRR required this visit?  No  CCM required this visit?  No      Plan:     I have personally reviewed and noted the following in the patient's chart:   . Medical and social history . Use of alcohol, tobacco or illicit drugs  . Current medications and supplements . Functional ability and status . Nutritional status . Physical activity . Advanced directives . List of other physicians . Hospitalizations, surgeries, and ER visits in previous 12 months . Vitals . Screenings to include cognitive, depression, and falls . Referrals and appointments  In addition, I have reviewed and discussed with patient certain preventive protocols, quality metrics, and best practice recommendations. A written personalized care plan for preventive services as well as general preventive health recommendations were provided to patient.     Crystalann Korf [place], [place]   [id number]   Nurse Notes: Pt would like to receive her flu shot at next in office apt. Pt plans to call and schedule her yearly mammogram.

## 2020-10-26 ENCOUNTER — Other Ambulatory Visit: Payer: Self-pay

## 2020-10-26 ENCOUNTER — Ambulatory Visit (INDEPENDENT_AMBULATORY_CARE_PROVIDER_SITE_OTHER): Payer: Medicare HMO

## 2020-10-26 DIAGNOSIS — Z0000 Encounter for general adult medical examination without abnormal findings: Secondary | ICD-10-CM | POA: Diagnosis not present

## 2020-10-26 NOTE — Patient Instructions (Signed)
Jackie Turner , Thank you for taking time to come for your Medicare Wellness Visit. I appreciate your ongoing commitment to your health goals. Please review the following plan we discussed and let me know if I can assist you in the future.   Screening recommendations/referrals: Colonoscopy: No longer required.  Mammogram: Currently due, patient plans to call and schedule apt. Bone Density: Up to date, due 06/2022 Recommended yearly ophthalmology/optometry visit for glaucoma screening and checkup Recommended yearly dental visit for hygiene and checkup  Vaccinations: Influenza vaccine: Currently due, will receive at next in office apt.  Pneumococcal vaccine: Completed series Tdap vaccine: Currently due. Declined receiving. Shingles vaccine: Shingrix discussed. Please contact your pharmacy for coverage information.     Advanced directives: Please bring a copy of your POA (Power of Attorney) and/or Living Will to your next appointment.   Conditions/risks identified: Recommend to continue to increase water intake to 6-8 8 oz glasses a day and work on eating 3 healthy meals a day.   Next appointment: 10/30/20 @ 1:40 PM with [place] 65 Years and Older, Female Preventive care refers to lifestyle choices and visits with your health care provider that can promote health and wellness. What does preventive care include?  A yearly physical exam. This is also called an annual well check.  Dental exams once or twice a year.  Routine eye exams. Ask your health care provider how often you should have your eyes checked.  Personal lifestyle choices, including:  Daily care of your teeth and gums.  Regular physical activity.  Eating a healthy diet.  Avoiding tobacco and drug use.  Limiting alcohol use.  Practicing safe sex.  Taking low-dose aspirin every day.  Taking vitamin and mineral supplements as recommended by your health care provider. What happens during  an annual well check? The services and screenings done by your health care provider during your annual well check will depend on your age, overall health, lifestyle risk factors, and family history of disease. Counseling  Your health care provider may ask you questions about your:  Alcohol use.  Tobacco use.  Drug use.  Emotional well-being.  Home and relationship well-being.  Sexual activity.  Eating habits.  History of falls.  Memory and ability to understand (cognition).  Work and work Statistician.  Reproductive health. Screening  You may have the following tests or measurements:  Height, weight, and BMI.  Blood pressure.  Lipid and cholesterol levels. These may be checked every 5 years, or more frequently if you are over 30 years old.  Skin check.  Lung cancer screening. You may have this screening every year starting at age 77 if you have a 30-pack-year history of smoking and currently smoke or have quit within the past 15 years.  Fecal occult blood test (FOBT) of the stool. You may have this test every year starting at age 49.  Flexible sigmoidoscopy or colonoscopy. You may have a sigmoidoscopy every 5 years or a colonoscopy every 10 years starting at age 30.  Hepatitis C blood test.  Hepatitis B blood test.  Sexually transmitted disease (STD) testing.  Diabetes screening. This is done by checking your blood sugar (glucose) after you have not eaten for a while (fasting). You may have this done every 1-3 years.  Bone density scan. This is done to screen for osteoporosis. You may have this done starting at age 14.  Mammogram. This may be done every 1-2 years. Talk to your health care provider  about how often you should have regular mammograms. Talk with your health care provider about your test results, treatment options, and if necessary, the need for more tests. Vaccines  Your health care provider may recommend certain vaccines, such as:  Influenza  vaccine. This is recommended every year.  Tetanus, diphtheria, and acellular pertussis (Tdap, Td) vaccine. You may need a Td booster every 10 years.  Zoster vaccine. You may need this after age 78.  Pneumococcal 13-valent conjugate (PCV13) vaccine. One dose is recommended after age 78.  Pneumococcal polysaccharide (PPSV23) vaccine. One dose is recommended after age 102. Talk to your health care provider about which screenings and vaccines you need and how often you need them. This information is not intended to replace advice given to you by your health care provider. Make sure you discuss any questions you have with your health care provider. Document Released: 12/29/2015 Document Revised: 08/21/2016 Document Reviewed: 10/03/2015 Elsevier Interactive Patient Education  2017 [place] Prevention in the Home Falls can cause injuries. They can happen to people of all ages. There are many things you can do to make your home safe and to help prevent falls. What can I do on the outside of my home?  Regularly fix the edges of walkways and driveways and fix any cracks.  Remove anything that might make you trip as you walk through a door, such as a raised step or threshold.  Trim any bushes or trees on the path to your home.  Use bright outdoor lighting.  Clear any walking paths of anything that might make someone trip, such as rocks or tools.  Regularly check to see if handrails are loose or broken. Make sure that both sides of any steps have handrails.  Any raised decks and porches should have guardrails on the edges.  Have any leaves, snow, or ice cleared regularly.  Use sand or salt on walking paths during winter.  Clean up any spills in your garage right away. This includes oil or grease spills. What can I do in the bathroom?  Use night lights.  Install grab bars by the toilet and in the tub and shower. Do not use towel bars as grab bars.  Use non-skid mats or decals  in the tub or shower.  If you need to sit down in the shower, use a plastic, non-slip stool.  Keep the floor dry. Clean up any water that spills on the floor as soon as it happens.  Remove soap buildup in the tub or shower regularly.  Attach bath mats securely with double-sided non-slip rug tape.  Do not have throw rugs and other things on the floor that can make you trip. What can I do in the bedroom?  Use night lights.  Make sure that you have a light by your bed that is easy to reach.  Do not use any sheets or blankets that are too big for your bed. They should not hang down onto the floor.  Have a firm chair that has side arms. You can use this for support while you get dressed.  Do not have throw rugs and other things on the floor that can make you trip. What can I do in the kitchen?  Clean up any spills right away.  Avoid walking on wet floors.  Keep items that you use a lot in easy-to-reach places.  If you need to reach something above you, use a strong step stool that has a grab bar.  Keep electrical cords out of the way.  Do not use floor polish or wax that makes floors slippery. If you must use wax, use non-skid floor wax.  Do not have throw rugs and other things on the floor that can make you trip. What can I do with my stairs?  Do not leave any items on the stairs.  Make sure that there are handrails on both sides of the stairs and use them. Fix handrails that are broken or loose. Make sure that handrails are as long as the stairways.  Check any carpeting to make sure that it is firmly attached to the stairs. Fix any carpet that is loose or worn.  Avoid having throw rugs at the top or bottom of the stairs. If you do have throw rugs, attach them to the floor with carpet tape.  Make sure that you have a light switch at the top of the stairs and the bottom of the stairs. If you do not have them, ask someone to add them for you. What else can I do to help  prevent falls?  Wear shoes that:  Do not have high heels.  Have rubber bottoms.  Are comfortable and fit you well.  Are closed at the toe. Do not wear sandals.  If you use a stepladder:  Make sure that it is fully opened. Do not climb a closed stepladder.  Make sure that both sides of the stepladder are locked into place.  Ask someone to hold it for you, if possible.  Clearly mark and make sure that you can see:  Any grab bars or handrails.  First and last steps.  Where the edge of each step is.  Use tools that help you move around (mobility aids) if they are needed. These include:  Canes.  Walkers.  Scooters.  Crutches.  Turn on the lights when you go into a dark area. Replace any light bulbs as soon as they burn out.  Set up your furniture so you have a clear path. Avoid moving your furniture around.  If any of your floors are uneven, fix them.  If there are any pets around you, be aware of where they are.  Review your medicines with your doctor. Some medicines can make you feel dizzy. This can increase your chance of falling. Ask your doctor what other things that you can do to help prevent falls. This information is not intended to replace advice given to you by your health care provider. Make sure you discuss any questions you have with your health care provider. Document Released: 09/28/2009 Document Revised: 05/09/2016 Document Reviewed: 01/06/2015 Elsevier Interactive Patient Education  2017 [employer].

## 2020-10-27 NOTE — Progress Notes (Signed)
Established patient visit   Patient: Jackie Turner   DOB: March 22, 1936   84 y.o. Female  MRN: [id number] Visit Date: 10/30/2020  Today's healthcare provider: Mar Daring, PA-C   Chief Complaint  Patient presents with  . Medication Refill   Subjective    HPI  Patient would like to review all her meds to make sure which one she needs to taking. She reports overall she is doing well. She has multiple specialist that have been helping her and has had multiple medication changes that she would like to review.  She does also mention that yesterday she had a loose BM that was full of bright red blood. Enough to discolor the toilet water. She had other BM yesterday with some bleeding, but not as severe. This morning she had another loose BM that she reports had dark discoloration and some tarry spots within the stool. She reports she has not had bleeding like that since her last hospitalization. She is followed by Dr. Dahlia Byes for a large polyp that will most likely require partial colectomy to remove. Patient is to have f/u imaging in 3 months.   Patient Active Problem List   Diagnosis Date Noted  . Polyp of colon   . Acute blood loss anemia 08/09/2020  . GI bleeding 08/04/2020  . Depression, major, single episode, in partial remission ([place]) 06/22/2020  . Atrial fibrillation with RVR ([place]) 06/13/2020  . Acute on chronic respiratory failure with hypoxia and hypercapnia ([place]) 06/13/2020  . Right hand pain 06/13/2020  . Fall at home, initial encounter 06/13/2020  . COPD (chronic obstructive pulmonary disease) ([place]) 06/13/2020  . Bronchiectasis without complication ([place]) 13/24/4010  . Lymphedema 05/17/2019  . Chronic venous insufficiency 05/17/2019  . Swelling of limb 02/10/2018  . Varicose veins of leg with swelling, bilateral 02/10/2018  . Squamous cell carcinoma 09/22/2017  . Hypoxia 09/27/2015  . Abnormal CXR 09/27/2015  . Asthma with allergic rhinitis 08/31/2015  .  Back ache 08/31/2015  . Chronic interstitial cystitis 08/31/2015  . Grief reaction 08/31/2015  . Hyperlipidemia 06/26/2015  . Migraine 06/26/2015  . Asthma 06/26/2015  . IBS (irritable bowel syndrome) 06/26/2015  . Osteoporosis 06/26/2015  . Fatigue 06/26/2015  . Lumbar radiculopathy 04/27/2013  . Urinary urgency 01/08/2012  . Frequency of urination 01/08/2012   Past Medical History:  Diagnosis Date  . Allergy   . Asthma   . Hyperlipidemia   . Osteoporosis        Medications: Outpatient Medications Prior to Visit  Medication Sig  . calcium carbonate (CALTRATE 600) 1500 (600 Ca) MG TABS tablet Take 1 tablet by mouth 2 (two) times daily with a meal.   . dicyclomine (BENTYL) 10 MG capsule Take 1 capsule (10 mg total) by mouth 3 (three) times daily as needed for spasms.  Marland Kitchen doxepin (SINEQUAN) 10 MG capsule Take 3 capsules (30 mg total) by mouth at bedtime.  . feeding supplement, ENSURE ENLIVE, (ENSURE ENLIVE) LIQD Take 237 mLs by mouth 2 (two) times daily between meals.  . ferrous gluconate (FERGON) 324 MG tablet Take 1 tablet (324 mg total) by mouth daily with breakfast.  . fluticasone (FLONASE) 50 MCG/ACT nasal spray Place 1-2 sprays into both nostrils daily.   . Fluticasone-Umeclidin-Vilant (TRELEGY ELLIPTA) 100-62.5-25 MCG/INH AEPB Inhale 1 puff into the lungs daily.  Marland Kitchen oxybutynin (DITROPAN XL) 15 MG 24 hr tablet Take 1 tablet (15 mg total) by mouth at bedtime.  . traMADol (ULTRAM) 50 MG tablet TAKE 1  TABLET BY MOUTH EVERY 8 HOURS AS NEEDED FOR PAIN  . Urea 40 % LOTN Apply 1 application topically daily as needed (dry skin).   . VENTOLIN HFA 108 (90 Base) MCG/ACT inhaler INHALE TWO PUFFS INTO THE LUNGS EVERY 4 HOURS AS NEEDED FOR WHEEZING OR SHORTNESS OF BREATH (Patient taking differently: Inhale 2 puffs into the lungs every 4 (four) hours as needed for wheezing or shortness of breath. )  . [DISCONTINUED] pentosan polysulfate (ELMIRON) 100 MG capsule Take 1 capsule (100 mg total) by  mouth in the morning and at bedtime. (Patient taking differently: Take 100 mg by mouth 2 (two) times daily as needed (urinary pain). )  . albuterol (PROVENTIL) (2.5 MG/3ML) 0.083% nebulizer solution Take 3 mLs (2.5 mg total) by nebulization every 6 (six) hours as needed for wheezing or shortness of breath. (Patient not taking: Reported on 10/30/2020)  . vitamin B-12 1000 MCG tablet Take 1 tablet (1,000 mcg total) by mouth daily. (Patient not taking: Reported on 10/26/2020)  . [DISCONTINUED] Clobetasol Propionate Emulsion 0.05 % topical foam Apply 1 application topically 2 (two) times daily.   . [DISCONTINUED] diclofenac Sodium (VOLTAREN) 1 % GEL Apply 4 g topically 4 (four) times daily. (Patient not taking: Reported on 10/26/2020)  . [DISCONTINUED] furosemide (LASIX) 40 MG tablet Take 1 tablet (40 mg total) by mouth daily. (Patient not taking: Reported on 10/30/2020)  . [DISCONTINUED] metoprolol tartrate (LOPRESSOR) 25 MG tablet Take 1 tablet (25 mg total) by mouth 2 (two) times daily. (Patient not taking: Reported on 10/30/2020)  . [DISCONTINUED] nystatin (MYCOSTATIN) 100000 UNIT/ML suspension TAKE 5 MLS BY MOUTH 4 TIMES A DAY. (Patient not taking: Reported on 10/26/2020)   No facility-administered medications prior to visit.    Review of Systems  Constitutional: Negative.   Respiratory: Positive for shortness of breath. Negative for cough, chest tightness and wheezing.   Cardiovascular: Negative.   Gastrointestinal: Positive for blood in stool and diarrhea. Negative for abdominal pain, nausea, rectal pain and vomiting.  Musculoskeletal: Positive for back pain.  Neurological: Positive for weakness. Negative for dizziness, light-headedness and headaches.    Last CBC Lab Results  Component Value Date   WBC 8.1 09/08/2020   HGB 10.8 (L) 09/08/2020   HCT 35.8 (L) 09/08/2020   MCV 80.8 09/08/2020   MCH 24.4 (L) 09/08/2020   RDW 22.4 (H) 09/08/2020   PLT 307 40/98/1191   Last metabolic  panel Lab Results  Component Value Date   GLUCOSE 106 (H) 09/04/2020   NA 141 09/04/2020   K 3.8 09/04/2020   CL 92 (L) 09/04/2020   CO2 38 (H) 09/04/2020   BUN 23 09/04/2020   CREATININE 0.98 09/04/2020   GFRNONAA 54 (L) 09/04/2020   GFRAA >60 09/04/2020   CALCIUM 10.1 09/04/2020   PHOS 2.3 (L) 08/08/2020   PROT 7.4 09/04/2020   ALBUMIN 4.3 09/04/2020   LABGLOB 2.1 08/03/2020   AGRATIO 1.9 08/03/2020   BILITOT 0.6 09/04/2020   ALKPHOS 69 09/04/2020   AST 21 09/04/2020   ALT 16 09/04/2020   ANIONGAP 11 09/04/2020      Objective    BP 120/82 (BP Location: Left Arm, Patient Position: Sitting, Cuff Size: Normal)   Pulse (!) 117   Temp 99.2 F (37.3 C) (Oral)   Resp 16   Wt 113 lb (51.3 kg)   SpO2 98%   BMI 22.82 kg/m  BP Readings from Last 3 Encounters:  10/30/20 120/82  10/16/20 130/71  09/19/20 126/72   Wt Readings  from Last 3 Encounters:  10/30/20 113 lb (51.3 kg)  10/16/20 113 lb (51.3 kg)  09/19/20 111 lb 12.8 oz (50.7 kg)      Physical Exam Vitals reviewed.  Constitutional:      General: She is not in acute distress.    Appearance: Normal appearance. She is well-developed and normal weight. She is not ill-appearing or diaphoretic.  Neck:     Thyroid: No thyromegaly.     Vascular: No JVD.     Trachea: No tracheal deviation.  Cardiovascular:     Rate and Rhythm: Normal rate. Rhythm irregularly irregular.     Heart sounds: Murmur heard.  No friction rub. No gallop.   Pulmonary:     Effort: Pulmonary effort is normal. No respiratory distress.     Breath sounds: Decreased air movement present. Examination of the right-upper field reveals rhonchi. Examination of the left-upper field reveals rhonchi. Decreased breath sounds and rhonchi present. No wheezing or rales.  Musculoskeletal:     Cervical back: Normal range of motion and neck supple.  Lymphadenopathy:     Cervical: No cervical adenopathy.  Neurological:     Mental Status: She is alert.       No results found for any visits on 10/30/20.  Assessment & Plan     1. Need for influenza vaccination Will get flu shot in 2 weeks at next visit.   2. Need for COVID-19 vaccine Covid 19 Booster Vaccine given to patient without complications. Patient sat for 15 minutes after administration and was tolerated well without adverse effects. - Pfizer SARS-COV-2 Vaccine  3. Permanent atrial fibrillation (HCC) Dr. Saralyn Pilar wants patient on Diltiazem for rate control. She reports she has not been taking, but her HR has been jumping from 90s-120s. Will restart diltiazem as below. Advised we will have to monitor BP closely as her BP is low today. F/U in 2 weeks.  - diltiazem (CARDIZEM CD) 120 MG 24 hr capsule; Take 1 capsule (120 mg total) by mouth daily.  Dispense: 90 capsule; Refill: 1  4. Lymphedema Only needs to use PRN with leg swelling. - furosemide (LASIX) 40 MG tablet; Take 1 tablet (40 mg total) by mouth daily as needed for edema.  Dispense: 90 tablet; Refill: 1  5. Thrush Uses prn if thrush develops from inhaler use.  - nystatin (MYCOSTATIN) 100000 UNIT/ML suspension; TAKE 5 MLS BY MOUTH 4 TIMES A DAY.  Dispense: 60 mL; Refill: 5  6. Chronic interstitial cystitis Uses BID for interstitial cystitis.  - pentosan polysulfate (ELMIRON) 100 MG capsule; Take 1 capsule (100 mg total) by mouth 2 (two) times daily as needed (urinary pain).  Dispense: 180 capsule; Refill: 1  7. Herpes labialis Notes having possible cold sores on her lips. Will try Valtrex as below. I feel it may be more cheilitis. Follow up in 2 weeks to see if treatment helps.  - valACYclovir (VALTREX) 1000 MG tablet; Take 1 tablet (1,000 mg total) by mouth 2 (two) times daily.  Dispense: 14 tablet; Refill: 0  8. Acute on chronic respiratory failure with hypoxia and hypercapnia (HCC) On 2L O2 24/7. Doing well. Continue inhalers as prescribed by Dr. Patsey Berthold, pulmonology.   9. Centrilobular emphysema ([place]) See above  medical treatment plan.  10. Gastrointestinal hemorrhage with melena Message sent to Dr. Dahlia Byes about recent bleeding with BM. Will most likely monitor. I will f/u in 2 weeks. Patient to call if symptoms worsen.   11. Polyp of colon, unspecified part of colon, unspecified  type See above medical treatment plan.   Return in about 2 weeks (around 11/13/2020).      Reynolds Bowl, PA-C, have reviewed all documentation for this visit. The documentation on 10/31/20 for the exam, diagnosis, procedures, and orders are all accurate and complete.   Rubye Beach  [facility] [phone] (phone) [phone] (fax)  [place]

## 2020-10-30 ENCOUNTER — Encounter: Payer: Self-pay | Admitting: Physician Assistant

## 2020-10-30 ENCOUNTER — Ambulatory Visit: Payer: Medicare HMO | Admitting: Physician Assistant

## 2020-10-30 ENCOUNTER — Other Ambulatory Visit: Payer: Self-pay

## 2020-10-30 VITALS — BP 120/82 | HR 117 | Temp 99.2°F | Resp 16 | Wt 113.0 lb

## 2020-10-30 DIAGNOSIS — I890 Lymphedema, not elsewhere classified: Secondary | ICD-10-CM

## 2020-10-30 DIAGNOSIS — N3010 Interstitial cystitis (chronic) without hematuria: Secondary | ICD-10-CM | POA: Diagnosis not present

## 2020-10-30 DIAGNOSIS — B001 Herpesviral vesicular dermatitis: Secondary | ICD-10-CM

## 2020-10-30 DIAGNOSIS — I4821 Permanent atrial fibrillation: Secondary | ICD-10-CM

## 2020-10-30 DIAGNOSIS — Z23 Encounter for immunization: Secondary | ICD-10-CM

## 2020-10-30 DIAGNOSIS — J432 Centrilobular emphysema: Secondary | ICD-10-CM

## 2020-10-30 DIAGNOSIS — K635 Polyp of colon: Secondary | ICD-10-CM

## 2020-10-30 DIAGNOSIS — B370 Candidal stomatitis: Secondary | ICD-10-CM

## 2020-10-30 DIAGNOSIS — J9621 Acute and chronic respiratory failure with hypoxia: Secondary | ICD-10-CM

## 2020-10-30 DIAGNOSIS — J9622 Acute and chronic respiratory failure with hypercapnia: Secondary | ICD-10-CM

## 2020-10-30 DIAGNOSIS — K921 Melena: Secondary | ICD-10-CM

## 2020-10-30 MED ORDER — FUROSEMIDE 40 MG PO TABS: 40.0000 mg | ORAL_TABLET | Freq: Every day | ORAL | 1 refills | Status: AC | PRN

## 2020-10-30 MED ORDER — PENTOSAN POLYSULFATE SODIUM 100 MG PO CAPS: 100.0000 mg | ORAL_CAPSULE | Freq: Two times a day (BID) | ORAL | 1 refills | Status: AC | PRN

## 2020-10-30 MED ORDER — VALACYCLOVIR HCL 1 G PO TABS: 1000.0000 mg | ORAL_TABLET | Freq: Two times a day (BID) | ORAL | 0 refills | Status: DC

## 2020-10-30 MED ORDER — NYSTATIN 100000 UNIT/ML MT SUSP: OROMUCOSAL | 5 refills | Status: DC

## 2020-10-30 MED ORDER — DILTIAZEM HCL ER COATED BEADS 120 MG PO CP24: 120.0000 mg | ORAL_CAPSULE | Freq: Every day | ORAL | 1 refills | Status: DC

## 2020-11-14 NOTE — Progress Notes (Signed)
Established patient visit   Patient: Jackie Turner   DOB: 07-18-1936   84 y.o. Female  MRN: [id number] Visit Date: 11/15/2020  Today's healthcare provider: Mar Daring, PA-C   Chief Complaint  Patient presents with  . Follow-up   Subjective    HPI  Follow up for Permanent Atrial Fibrillation  The patient was last seen for this 2 weeks ago. Changes made at last visit include restart Diltiazem, will monitor BP closely.  Follow up for Gastrointestinal Hemorrhage with Melena  The patient was last seen for this 2 weeks ago. Changes made at last visit include will monitor message sent to Dr. Dahlia Byes. Reports no more blood.  Follow up for Herpes Labialis  The patient was last seen for this 2 weeks ago. Changes made at last visit include will try Valtrex.  She reports excellent compliance with treatment.  No change in symptoms. Still has some irritation at the corner of her mouth.    Patient Active Problem List   Diagnosis Date Noted  . Memory loss 11/21/2020  . Polyp of colon   . Acute blood loss anemia 08/09/2020  . GI bleeding 08/04/2020  . Depression, major, single episode, in partial remission ([place]) 06/22/2020  . Atrial fibrillation with RVR ([place]) 06/13/2020  . Acute on chronic respiratory failure with hypoxia and hypercapnia ([place]) 06/13/2020  . Right hand pain 06/13/2020  . Fall at home, initial encounter 06/13/2020  . COPD (chronic obstructive pulmonary disease) ([place]) 06/13/2020  . Bronchiectasis without complication ([place]) 01/74/9449  . Lymphedema 05/17/2019  . Chronic venous insufficiency 05/17/2019  . Swelling of limb 02/10/2018  . Varicose veins of leg with swelling, bilateral 02/10/2018  . Squamous cell carcinoma 09/22/2017  . Hypoxia 09/27/2015  . Abnormal CXR 09/27/2015  . Asthma with allergic rhinitis 08/31/2015  . Back ache 08/31/2015  . Chronic interstitial cystitis 08/31/2015  . Grief reaction 08/31/2015  . Hyperlipidemia  06/26/2015  . Migraine 06/26/2015  . Asthma 06/26/2015  . IBS (irritable bowel syndrome) 06/26/2015  . Osteoporosis 06/26/2015  . Fatigue 06/26/2015  . Lumbar radiculopathy 04/27/2013  . Urinary urgency 01/08/2012  . Frequency of urination 01/08/2012   Past Medical History:  Diagnosis Date  . Allergy   . Asthma   . Hyperlipidemia   . Osteoporosis        Medications: Outpatient Medications Prior to Visit  Medication Sig  . albuterol (PROVENTIL) (2.5 MG/3ML) 0.083% nebulizer solution Take 3 mLs (2.5 mg total) by nebulization every 6 (six) hours as needed for wheezing or shortness of breath. (Patient not taking: Reported on 10/30/2020)  . calcium carbonate (CALTRATE 600) 1500 (600 Ca) MG TABS tablet Take 1 tablet by mouth 2 (two) times daily with a meal.   . dicyclomine (BENTYL) 10 MG capsule Take 1 capsule (10 mg total) by mouth 3 (three) times daily as needed for spasms.  Marland Kitchen doxepin (SINEQUAN) 10 MG capsule Take 3 capsules (30 mg total) by mouth at bedtime.  . feeding supplement, ENSURE ENLIVE, (ENSURE ENLIVE) LIQD Take 237 mLs by mouth 2 (two) times daily between meals.  . ferrous gluconate (FERGON) 324 MG tablet Take 1 tablet (324 mg total) by mouth daily with breakfast.  . fluticasone (FLONASE) 50 MCG/ACT nasal spray Place 1-2 sprays into both nostrils daily.   . Fluticasone-Umeclidin-Vilant (TRELEGY ELLIPTA) 100-62.5-25 MCG/INH AEPB Inhale 1 puff into the lungs daily.  . furosemide (LASIX) 40 MG tablet Take 1 tablet (40 mg total) by mouth daily as needed for  edema.  . nystatin (MYCOSTATIN) 100000 UNIT/ML suspension TAKE 5 MLS BY MOUTH 4 TIMES A DAY.  Marland Kitchen oxybutynin (DITROPAN XL) 15 MG 24 hr tablet Take 1 tablet (15 mg total) by mouth at bedtime.  . pentosan polysulfate (ELMIRON) 100 MG capsule Take 1 capsule (100 mg total) by mouth 2 (two) times daily as needed (urinary pain).  . traMADol (ULTRAM) 50 MG tablet TAKE 1 TABLET BY MOUTH EVERY 8 HOURS AS NEEDED FOR PAIN  . Urea 40 %  LOTN Apply 1 application topically daily as needed (dry skin).   . VENTOLIN HFA 108 (90 Base) MCG/ACT inhaler INHALE TWO PUFFS INTO THE LUNGS EVERY 4 HOURS AS NEEDED FOR WHEEZING OR SHORTNESS OF BREATH (Patient taking differently: Inhale 2 puffs into the lungs every 4 (four) hours as needed for wheezing or shortness of breath. )  . vitamin B-12 1000 MCG tablet Take 1 tablet (1,000 mcg total) by mouth daily. (Patient not taking: Reported on 10/26/2020)  . [DISCONTINUED] diltiazem (CARDIZEM CD) 120 MG 24 hr capsule Take 1 capsule (120 mg total) by mouth daily.  . [DISCONTINUED] valACYclovir (VALTREX) 1000 MG tablet Take 1 tablet (1,000 mg total) by mouth 2 (two) times daily.   No facility-administered medications prior to visit.    Review of Systems  Constitutional: Negative.   Respiratory: Positive for shortness of breath (chronic).   Cardiovascular: Negative.   Gastrointestinal: Negative for blood in stool.  Neurological: Negative for weakness and numbness.    Last CBC Lab Results  Component Value Date   WBC 8.1 09/08/2020   HGB 10.8 (L) 09/08/2020   HCT 35.8 (L) 09/08/2020   MCV 80.8 09/08/2020   MCH 24.4 (L) 09/08/2020   RDW 22.4 (H) 09/08/2020   PLT 307 10/93/2355   Last metabolic panel Lab Results  Component Value Date   GLUCOSE 106 (H) 09/04/2020   NA 141 09/04/2020   K 3.8 09/04/2020   CL 92 (L) 09/04/2020   CO2 38 (H) 09/04/2020   BUN 23 09/04/2020   CREATININE 0.98 09/04/2020   GFRNONAA 54 (L) 09/04/2020   GFRAA >60 09/04/2020   CALCIUM 10.1 09/04/2020   PHOS 2.3 (L) 08/08/2020   PROT 7.4 09/04/2020   ALBUMIN 4.3 09/04/2020   LABGLOB 2.1 08/03/2020   AGRATIO 1.9 08/03/2020   BILITOT 0.6 09/04/2020   ALKPHOS 69 09/04/2020   AST 21 09/04/2020   ALT 16 09/04/2020   ANIONGAP 11 09/04/2020      Objective    BP 115/76 (BP Location: Left Arm, Patient Position: Sitting, Cuff Size: Normal)   Pulse (!) 132   Temp 100 F (37.8 C) (Oral)   Resp 16   Wt 115 lb  9.6 oz (52.4 kg)   SpO2 97%   BMI 23.35 kg/m  BP Readings from Last 3 Encounters:  11/15/20 115/76  10/30/20 120/82  10/16/20 130/71   Wt Readings from Last 3 Encounters:  11/15/20 115 lb 9.6 oz (52.4 kg)  10/30/20 113 lb (51.3 kg)  10/16/20 113 lb (51.3 kg)      Physical Exam Vitals reviewed.  Constitutional:      General: She is not in acute distress.    Appearance: Normal appearance. She is well-developed. She is not ill-appearing or diaphoretic.  Cardiovascular:     Rate and Rhythm: Normal rate. Rhythm irregularly irregular.     Pulses: Normal pulses.     Heart sounds: Normal heart sounds. No murmur heard.  No friction rub. No gallop.   Pulmonary:  Effort: Pulmonary effort is normal. No respiratory distress.     Breath sounds: Normal breath sounds. No wheezing or rales.  Musculoskeletal:     Cervical back: Normal range of motion and neck supple.  Neurological:     Mental Status: She is alert.      No results found for any visits on 11/15/20.  Assessment & Plan     1. Iron deficiency anemia secondary to inadequate dietary iron intake Wanted to check labs for patient to see if she needed to continue iron supplement at this time, but patient left without having labs drawn. Will check at next visit. She is returning in one month for f/u chronic issues.  - CBC w/Diff/Platelet  2. Need for influenza vaccination Flu vaccine given today without complication. Patient sat upright for 15 minutes to check for adverse reaction before being released. - Flu Vaccine QUAD High Dose(Fluad)  3. Angular cheilitis I had suspected that her mouth symptoms were more related to angular cheilitis, however, she had wanted to see if possible cold sores since this is what someone had told her. Being that her symptoms did not change at all, I do think it is more angular cheilitis. Will treat with lotrisone as below.  - clotrimazole-betamethasone (LOTRISONE) cream; Apply 1 application  topically 2 (two) times daily.  Dispense: 45 g; Refill: 0   Return in about 4 weeks (around 12/13/2020) for chronic issues.      Reynolds Bowl, PA-C, have reviewed all documentation for this visit. The documentation on 11/21/20 for the exam, diagnosis, procedures, and orders are all accurate and complete.   Rubye Beach  [facility] [phone] (phone) [phone] (fax)  [place]

## 2020-11-15 ENCOUNTER — Ambulatory Visit (INDEPENDENT_AMBULATORY_CARE_PROVIDER_SITE_OTHER): Payer: Medicare HMO | Admitting: Physician Assistant

## 2020-11-15 ENCOUNTER — Encounter: Payer: Self-pay | Admitting: Physician Assistant

## 2020-11-15 ENCOUNTER — Other Ambulatory Visit: Payer: Self-pay

## 2020-11-15 ENCOUNTER — Ambulatory Visit: Payer: Self-pay | Admitting: Physician Assistant

## 2020-11-15 VITALS — BP 115/76 | HR 132 | Temp 100.0°F | Resp 16 | Wt 115.6 lb

## 2020-11-15 DIAGNOSIS — Z23 Encounter for immunization: Secondary | ICD-10-CM | POA: Diagnosis not present

## 2020-11-15 DIAGNOSIS — K130 Diseases of lips: Secondary | ICD-10-CM

## 2020-11-15 DIAGNOSIS — D508 Other iron deficiency anemias: Secondary | ICD-10-CM

## 2020-11-15 MED ORDER — CLOTRIMAZOLE-BETAMETHASONE 1-0.05 % EX CREA: 1.0000 "application " | TOPICAL_CREAM | Freq: Two times a day (BID) | CUTANEOUS | 0 refills | Status: DC

## 2020-11-15 NOTE — Patient Instructions (Addendum)
Do NOT take Verapamil. This has been changed to Cardiazem XL/Cartia XL (diltiazem) 120mg  daily.   Clotrimazole-Betamethasone cream is to be used twice daily (Small amount) on the corners of the mouth x 3 weeks. Stop after christmas. Call if not improving.

## 2020-11-17 ENCOUNTER — Telehealth: Payer: Self-pay

## 2020-11-17 DIAGNOSIS — J9621 Acute and chronic respiratory failure with hypoxia: Secondary | ICD-10-CM

## 2020-11-17 DIAGNOSIS — J9622 Acute and chronic respiratory failure with hypercapnia: Secondary | ICD-10-CM

## 2020-11-17 DIAGNOSIS — I4891 Unspecified atrial fibrillation: Secondary | ICD-10-CM

## 2020-11-17 DIAGNOSIS — J479 Bronchiectasis, uncomplicated: Secondary | ICD-10-CM

## 2020-11-17 DIAGNOSIS — R413 Other amnesia: Secondary | ICD-10-CM

## 2020-11-17 NOTE — Telephone Encounter (Signed)
Please advise 

## 2020-11-17 NOTE — Telephone Encounter (Signed)
Copied from [place] [phone]. Topic: General - Other >> Nov 17, 2020 11:24 AM Leward Quan A wrote: Reason for CRM: Patient called in to inform Jackie Turner that the Rx diltiazem (CARDIZEM CD) 120 MG 24 hr capsule after taking it before going to bed, upon waking up in the night to go to the restroom she was very dizzy and almost fell over. She is asking for a call back please at Ph# [phone]

## 2020-11-20 MED ORDER — RAMIPRIL 2.5 MG PO CAPS: 2.5000 mg | ORAL_CAPSULE | Freq: Every day | ORAL | 3 refills | Status: DC

## 2020-11-20 MED ORDER — DILTIAZEM HCL ER 60 MG PO CP12: 60.0000 mg | ORAL_CAPSULE | Freq: Two times a day (BID) | ORAL | 1 refills | Status: DC

## 2020-11-20 NOTE — Addendum Note (Signed)
Addended by: Mar Daring on: 11/20/2020 12:43 PM   Modules accepted: Orders

## 2020-11-20 NOTE — Telephone Encounter (Signed)
Change diltiazem from 120mg  once daily to 60mg  twice daily to see if that helps.  60 mg dose has been sent to [facility].   Stop diltiazem 120mg .  Ramipril 2.5mg  sent to [facility]. Ok to restart.

## 2020-11-20 NOTE — Telephone Encounter (Signed)
Reports that she feels much better but she stopped the Diltiazem.  She said that ever since she has been off the Ramipril she has been having this throbbing pains in her left eye. She is wondering if she needs to go back on it.

## 2020-11-20 NOTE — Telephone Encounter (Signed)
How is she feeling today?

## 2020-11-21 ENCOUNTER — Encounter: Payer: Self-pay | Admitting: Physician Assistant

## 2020-11-21 ENCOUNTER — Telehealth: Payer: Self-pay | Admitting: *Deleted

## 2020-11-21 DIAGNOSIS — R413 Other amnesia: Secondary | ICD-10-CM | POA: Insufficient documentation

## 2020-11-21 MED ORDER — VERAPAMIL HCL ER 120 MG PO TBCR: 120.0000 mg | EXTENDED_RELEASE_TABLET | Freq: Every day | ORAL | 1 refills | Status: DC

## 2020-11-21 NOTE — Telephone Encounter (Signed)
Patient was advised as directed below.

## 2020-11-21 NOTE — Telephone Encounter (Signed)
Ok so I can change her back to verapamil. Since we are using it for rate control, I have changed the dose from her previous dose of 180mg  to 120mg . She is to take one whole pill at bedtime every night.   She is to stop Diltiazem Geronimo Boot). Do NOT pick up diltiazem or ramipril at pharmacy. I have sent note to pharmacy about d/c these orders. Josie, [facility] d/c diltiazem and ramipril. She will go on Verapamil CD 120mg  daily.   I have also placed a referral to CCM team for assistance with making sure patient understands changes and her medical issues. She does live alone and has no children. Her only living relative is her sister. So this complicates her situation as she has had long standing confusion and memory issues since her atrial fibrillation diagnosis and hypoxic episode.

## 2020-11-21 NOTE — Addendum Note (Signed)
Addended by: Mar Daring on: 11/21/2020 10:37 AM   Modules accepted: Orders

## 2020-11-21 NOTE — Chronic Care Management (AMB) (Signed)
  Chronic Care Management   Note  11/21/2020 Name: Jackie Turner MRN: [id number] DOB: 1936-04-13  Jackie Turner is a 84 y.o. year old female who is a primary care patient of Rubye Beach. I reached out to Teola Bradley by phone today in response to a referral sent by [name]'s PCP, Mar Daring, PA-C.     [name] was given information about Chronic Care Management services today including:  1. CCM service includes personalized support from designated clinical staff supervised by her physician, including individualized plan of care and coordination with other care providers 2. 24/7 contact phone numbers for assistance for urgent and routine care needs. 3. Service will only be billed when office clinical staff spend 20 minutes or more in a month to coordinate care. 4. Only one practitioner may furnish and bill the service in a calendar month. 5. The patient may stop CCM services at any time (effective at the end of the month) by phone call to the office staff. 6. The patient will be responsible for cost sharing (co-pay) of up to 20% of the service fee (after annual deductible is met).  Patient agreed to services and verbal consent obtained.   Follow up plan: Telephone appointment with care management team member scheduled for:11/24/2020  [facility]

## 2020-11-21 NOTE — Telephone Encounter (Signed)
Per patient she doesn't feel comfortable taking the Diltiazem and that she has a whole bottle of Verapamil that she thinks she needs for her eye. Reports not remembering the conversation we had and that she is very confuse. I told her we had this conversation yesterday and that she requested to restart the Ramipril. Patient stated "honey, I don't remember. I just need someone to tel me this is what you need to be on and taking and what they are for and I should be good"

## 2020-11-24 ENCOUNTER — Ambulatory Visit: Payer: Medicare HMO

## 2020-11-27 NOTE — Chronic Care Management (AMB) (Signed)
  Chronic Care Management   Outreach Note   Name: Jackie Turner MRN: [id number] DOB: 05/12/1936  Primary Care Provider: Mar Daring, PA-C Reason for referral : Chronic Care Management   Ms. Mehlhoff was referred to the case management team for assistance with care management and care coordination. She was scheduled for an initial outreach today. She expressed concerns regarding the reason for the referral. We discussed concerns regarding medication management and benefits of chronic care management services. She is agreeable to completing the initial outreach on 11/28/20. Contact information provided. She was encouraged to call if needed prior to the rescheduled outreach.    Follow Up Plan:  Initial outreach rescheduled for 11/28/20.     [name] Care Management [facility] [phone]

## 2020-11-28 ENCOUNTER — Ambulatory Visit: Payer: Self-pay

## 2020-11-28 DIAGNOSIS — I4891 Unspecified atrial fibrillation: Secondary | ICD-10-CM

## 2020-11-28 DIAGNOSIS — J449 Chronic obstructive pulmonary disease, unspecified: Secondary | ICD-10-CM

## 2020-12-04 NOTE — Chronic Care Management (AMB) (Signed)
Chronic Care Management   Initial Visit Note   Name: Jackie Turner MRN: [id number] DOB: 06-07-1936  Primary Care Provider: Mar Daring, PA-C Reason for referral : Chronic Care Management  Jackie Turner is a 84 y.o. year old female who is a primary care patient of Rubye Beach. The CCM team was consulted for assistance with chronic disease management and care coordination. The initial telephonic outreach was conducted today.  Review of Jackie Turner's status, including review of consultants reports, relevant labs and test results was conducted today. Collaboration with appropriate care team members was performed as part of the comprehensive evaluation and provision of chronic care management services.    SDOH (Social Determinants of Health) assessments performed: Yes See Care Plan activities for detailed interventions related to SDOH  SDOH Interventions   Flowsheet Row Most Recent Value  SDOH Interventions   Financial Strain Interventions Intervention Not Indicated  Stress Interventions Intervention Not Indicated  Transportation Interventions Intervention Not Indicated       Medications: Outpatient Encounter Medications as of 11/28/2020  Medication Sig  . clotrimazole-betamethasone (LOTRISONE) cream Apply 1 application topically 2 (two) times daily.  Marland Kitchen dicyclomine (BENTYL) 10 MG capsule Take 1 capsule (10 mg total) by mouth 3 (three) times daily as needed for spasms.  . feeding supplement, ENSURE ENLIVE, (ENSURE ENLIVE) LIQD Take 237 mLs by mouth 2 (two) times daily between meals.  . fluticasone (FLONASE) 50 MCG/ACT nasal spray Place 1-2 sprays into both nostrils daily.   . Fluticasone-Umeclidin-Vilant (TRELEGY ELLIPTA) 100-62.5-25 MCG/INH AEPB Inhale 1 puff into the lungs daily.  . furosemide (LASIX) 40 MG tablet Take 1 tablet (40 mg total) by mouth daily as needed for edema.  Marland Kitchen nystatin (MYCOSTATIN) 100000 UNIT/ML suspension TAKE 5 MLS BY MOUTH  4 TIMES A DAY.  Marland Kitchen oxybutynin (DITROPAN XL) 15 MG 24 hr tablet Take 1 tablet (15 mg total) by mouth at bedtime.  . traMADol (ULTRAM) 50 MG tablet TAKE 1 TABLET BY MOUTH EVERY 8 HOURS AS NEEDED FOR PAIN  . Urea 40 % LOTN Apply 1 application topically daily as needed (dry skin).   . VENTOLIN HFA 108 (90 Base) MCG/ACT inhaler INHALE TWO PUFFS INTO THE LUNGS EVERY 4 HOURS AS NEEDED FOR WHEEZING OR SHORTNESS OF BREATH (Patient taking differently: Inhale 2 puffs into the lungs every 4 (four) hours as needed for wheezing or shortness of breath.)  . albuterol (PROVENTIL) (2.5 MG/3ML) 0.083% nebulizer solution Take 3 mLs (2.5 mg total) by nebulization every 6 (six) hours as needed for wheezing or shortness of breath. (Patient not taking: No sig reported)  . calcium carbonate (CALTRATE 600) 1500 (600 Ca) MG TABS tablet Take 1 tablet by mouth 2 (two) times daily with a meal.   . doxepin (SINEQUAN) 10 MG capsule Take 3 capsules (30 mg total) by mouth at bedtime.  . ferrous gluconate (FERGON) 324 MG tablet Take 1 tablet (324 mg total) by mouth daily with breakfast.  . pentosan polysulfate (ELMIRON) 100 MG capsule Take 1 capsule (100 mg total) by mouth 2 (two) times daily as needed (urinary pain).  . verapamil (CALAN-SR) 120 MG CR tablet Take 1 tablet (120 mg total) by mouth at bedtime.  . vitamin B-12 1000 MCG tablet Take 1 tablet (1,000 mcg total) by mouth daily. (Patient not taking: No sig reported)   No facility-administered encounter medications on file as of 11/28/2020.     Objective:  BP Readings from Last 3 Encounters:  11/15/20 115/76  10/30/20  120/82  10/16/20 130/71     Goals Addressed            This Visit's Progress   . Chronic Care Management       Current Barriers:  . Chronic Disease Management support and education needs related to Atrial Fibrillation, COPD   Nurse Case Manager Clinical Goal(s):  Over the next 120 days, patient will: . Not require hospitalization or emergent  care d/t complications r/t chronic illnesses. . Take medications as prescribed. . Monitor pulse, oxygen saturations and record readings. . Follow recommendations for COPD self-management and seek medical attention when indicated. . Attend scheduled medical appointments. . Follow recommended safety precautions to prevent falls and injuries.  Over the next 7 days, patient will: . Follow-up with  [employer] [facility] [employer] regarding delivery of additional oxygen tubing.  Interventions:  . 1:1 collaboration with Mar Daring, PA-C regarding development and update of comprehensive plan of care as evidenced by provider attestation and co-signature . Inter-disciplinary care team collaboration (see longitudinal plan of care) . Reviewed medications and indications for use. Advised to take medications as prescribed. Advised to avoid abruptly discontinuing medications and notify provider if unable to tolerate prescribed regimen. Encouraged to notify care management team with concerns regarding medication management and prescription costs. Thoroughly discussed PCP recommendations regarding Verapamil and Cartia. She reports discontinuing Cartia as advised. She reports recently receiving two prescriptions for verapamil.-one for 180 mg and another for 120 mg. She reports discontinuing the 180 mg dose and putting it away as instructed. She admits to being apprehensive about taking the entire 120 mg verapamil dose. She reports splitting the dose and only taking 60 mg over the weekend to monitor the side effects. Denies episodes of chest discomfort or palpitations. Denies dizziness or lightheadedness. We discussed her comfort with self-administering medications as she reports occasionally getting confused about the names and doses. Offered to assist with setting up pill boxes. Also offered to place referral for assistance with compliance/adherence packages. She declined current need for assistance. States her local  pharmacy team helps when needed. Agreed to update if this changes and CCM Pharmacy assistance is needed.   . Provided information regarding COPD self-management. Reviewed recommended interventions along with worsening s/sx that require immediate medical attention. Encouraged to continue using home O2 and monitoring oxygen saturations as recommended. Currently using O2 at 2L/min. Reports oxygen saturations have ranged in the mid to high 90's at rest. She experiences shortness of breath with exertion but states she is able to complete daily tasks without assistance. She is interested in receiving a portable oxygen concentrator. She was previously tested but the device was not indicated at the time. She plans to discuss possible retesting during her next Pulmonology visit.   Marland Kitchen Provided information regarding safety and fall prevention measures. Encouraged to use assistive device as needed when ambulating. Encouraged to keep pathways clear and well lit to prevent accidental falls and injuries. Discussed importance of being particularly careful with oxygen extension tubing as this can create a trip hazard. Encouraged to change positions slowly and allow time to adjust prior to ambulating.   . Reviewed pending/scheduled medical appointments. Advised to attend medical appointments as scheduled to prevent delays in care. Encouraged to contact the care management team with concerns regarding transportation.   . Discussed plan for ongoing care management and follow-up. Provided direct contact information. Reports living alone and doing well in the home. Reports good support from family and friends. Denies current need for additional in-home  assistance. She is scheduled for PCP on 12/21/19. Agreeable to follow-up care management outreach next month.   Self-Care Activities Attends scheduled provider appointments Performs ADL's independently Performs IADL's independently   Initial Goal Documentation.             Ms. Belfiore was given information about Chronic Care Management services including:  1. CCM service includes personalized support from designated clinical staff supervised by her physician, including individualized plan of care and coordination with other care providers 2. 24/7 contact phone numbers for assistance for urgent and routine care needs. 3. Service will only be billed when office clinical staff spend 20 minutes or more in a month to coordinate care. 4. Only one practitioner may furnish and bill the service in a calendar month. 5. The patient may stop CCM services at any time (effective at the end of the month) by phone call to the office staff. 6. The patient will be responsible for cost sharing (co-pay) of up to 20% of the service fee (after annual deductible is met).  Patient agreed to services and verbal consent obtained.    PLAN A member of the care management team will follow-up with Mrs. Mingle next month.   [name] Health/[facility] Care Management [facility] [phone]

## 2020-12-04 NOTE — Patient Instructions (Signed)
Thank you for allowing the Chronic Care Management team to participate in your care.  Goals Addressed            This Visit's Progress   . Chronic Care Management       Current Barriers:  . Chronic Disease Management support and education needs related to Atrial Fibrillation, COPD   Nurse Case Manager Clinical Goal(s):  Over the next 120 days, patient will: . Not require hospitalization or emergent care d/t complications r/t chronic illnesses. . Take medications as prescribed. . Monitor pulse, oxygen saturations and record readings. . Follow recommendations for COPD self-management and seek medical attention when indicated. . Attend scheduled medical appointments. . Follow recommended safety precautions to prevent falls and injuries.  Over the next 7 days, patient will: . Follow-up with  [employer] [facility] [employer] regarding delivery of additional oxygen tubing.  Interventions:  . 1:1 collaboration with Jackie Daring, PA-C regarding development and update of comprehensive plan of care as evidenced by provider attestation and co-signature . Inter-disciplinary care team collaboration (see longitudinal plan of care) . Reviewed medications and indications for use. Advised to take medications as prescribed. Advised to avoid abruptly discontinuing medications and notify provider if unable to tolerate prescribed regimen. Encouraged to notify care management team with concerns regarding medication management and prescription costs. Thoroughly discussed PCP recommendations regarding Verapamil and Cartia. She reports discontinuing Cartia as advised. She reports recently receiving two prescriptions for verapamil.-one for 180 mg and another for 120 mg. She reports discontinuing the 180 mg dose and putting it away as instructed. She admits to being apprehensive about taking the entire 120 mg verapamil dose. She reports splitting the dose and only taking 60 mg over the weekend to monitor the side  effects. Denies episodes of chest discomfort or palpitations. Denies dizziness or lightheadedness. We discussed her comfort with self-administering medications as she reports occasionally getting confused about the names and doses. Offered to assist with setting up pill boxes. Also offered to place referral for assistance with compliance/adherence packages. She declined current need for assistance. States her local pharmacy team helps when needed. Agreed to update if this changes and CCM Pharmacy assistance is needed.   . Provided information regarding COPD self-management. Reviewed recommended interventions along with worsening s/sx that require immediate medical attention. Encouraged to continue using home O2 and monitoring oxygen saturations as recommended. Currently using O2 at 2L/min. Reports oxygen saturations have ranged in the mid to high 90's at rest. She experiences shortness of breath with exertion but states she is able to complete daily tasks without assistance. She is interested in receiving a portable oxygen concentrator. She was previously tested but the device was not indicated at the time. She plans to discuss possible retesting during her next Pulmonology visit.   Marland Kitchen Provided information regarding safety and fall prevention measures. Encouraged to use assistive device as needed when ambulating. Encouraged to keep pathways clear and well lit to prevent accidental falls and injuries. Discussed importance of being particularly careful with oxygen extension tubing as this can create a trip hazard. Encouraged to change positions slowly and allow time to adjust prior to ambulating.   . Reviewed pending/scheduled medical appointments. Advised to attend medical appointments as scheduled to prevent delays in care. Encouraged to contact the care management team with concerns regarding transportation.   . Discussed plan for ongoing care management and follow-up. Provided direct contact information.  Reports living alone and doing well in the home. Reports good support from family and  friends. Denies current need for additional in-home assistance. She is scheduled for PCP on 12/21/19. Agreeable to follow-up care management outreach next month.   Self-Care Activities Attends scheduled provider appointments Performs ADL's independently Performs IADL's independently   Initial Goal Documentation.            Jackie Turner verbalized understanding of the information discussed during the telephonic outreach today. Declined need for a mailed/printed copy of the information.   A member of the care management team will follow-up with Jackie Turner next month.    Jackie Turner Health/[facility] Care Management [facility] [phone]

## 2020-12-20 ENCOUNTER — Ambulatory Visit: Payer: Self-pay | Admitting: Physician Assistant

## 2020-12-21 ENCOUNTER — Ambulatory Visit: Payer: Self-pay

## 2021-01-03 ENCOUNTER — Other Ambulatory Visit: Payer: Self-pay | Admitting: Physician Assistant

## 2021-01-03 ENCOUNTER — Ambulatory Visit: Payer: Medicare HMO | Admitting: Internal Medicine

## 2021-01-03 DIAGNOSIS — F324 Major depressive disorder, single episode, in partial remission: Secondary | ICD-10-CM

## 2021-01-04 ENCOUNTER — Other Ambulatory Visit: Payer: Self-pay | Admitting: Physician Assistant

## 2021-01-04 DIAGNOSIS — F324 Major depressive disorder, single episode, in partial remission: Secondary | ICD-10-CM

## 2021-01-09 ENCOUNTER — Ambulatory Visit: Payer: Self-pay

## 2021-01-09 NOTE — Chronic Care Management (AMB) (Signed)
  Chronic Care Management   Note   Name: Jackie Turner MRN: [id number] DOB: Jul 04, 1936   Care Coordination: Oxygen Supplies Brief outreach with Ms. Rule regarding oxygen supplies. [employer] supply technician confirmed that [name] has the needed oxygen tubing and mask for her nebulizer. The agency agreed to send additional supplies via mail order if needed.    Follow up plan: Will follow up with [name] as scheduled later this month.   [name] Health/[facility] Care Management [facility] [phone]

## 2021-01-10 NOTE — Chronic Care Management (AMB) (Signed)
  Chronic Care Management   Outreach Note   Name: Danniell Rotundo MRN: [id number] DOB: 01/16/1936  Primary Care Provider: Mar Daring, PA-C Reason for referral : Chronic Care Management   Ms. Goon was scheduled for routine outreach. today. Reports pending another phone call today and prefers to complete outreach at a later date. She is scheduled for a clinic visit with her primary care provider on 01/25/21. She is agreeable to completing a face to face outreach on that date. Agreed to call if assistance is needed prior to her scheduled clinic visit.    Follow Up Plan:  A member of the care management team will meet with Ms. Hosang on 01/25/21.    [name] Health/[facility] Care Management [facility] [phone]

## 2021-01-12 ENCOUNTER — Other Ambulatory Visit: Payer: Self-pay | Admitting: Physician Assistant

## 2021-01-12 NOTE — Telephone Encounter (Signed)
Requested medication (s) are due for refill today: yes  Requested medication (s) are on the active medication list: yes  Last refill:  last refilled on 11/07/20 by historical provider  Future visit scheduled: yes  Notes to clinic:  Please review for refill. Last filled by historical provider    Requested Prescriptions  Pending Prescriptions Disp Refills   fluticasone (FLONASE) 50 MCG/ACT nasal spray [Pharmacy Med Name: FLUTICASONE PROPIONATE 50 MCG/ACT N] 16 g     Sig: USE 2 SPRAYS IN EACH NOSTRIL ONCE A DAY.      Ear, Nose, and Throat: Nasal Preparations - Corticosteroids Passed - 01/12/2021  3:32 PM      Passed - Valid encounter within last 12 months    Recent Outpatient Visits           1 month ago Iron deficiency anemia secondary to inadequate dietary iron intake   [place], [place]   2 months ago Need for influenza vaccination   [facility] [place], [place]   4 months ago Anemia, unspecified type   [place], [place]   5 months ago Daytime somnolence   [place], [place]   5 months ago Panic attacks   [facility] [place], Clearnce Sor[place], [place]       Future Appointments             In 4 days Warner Mccreedy, Vinson Moselle, NP [facility]   In 1 week Marlyn Corporal, Clearnce [place], PA-C [employer], [place]

## 2021-01-16 ENCOUNTER — Ambulatory Visit: Payer: Medicare HMO | Admitting: Pulmonary Disease

## 2021-01-24 ENCOUNTER — Ambulatory Visit: Payer: Medicare HMO | Admitting: Primary Care

## 2021-01-25 ENCOUNTER — Ambulatory Visit: Payer: Self-pay | Admitting: Physician Assistant

## 2021-01-25 ENCOUNTER — Ambulatory Visit: Payer: Self-pay

## 2021-01-25 ENCOUNTER — Telehealth: Payer: Self-pay

## 2021-01-25 NOTE — Chronic Care Management (AMB) (Signed)
  Chronic Care Management   Note  01/25/2021 Name: Jackie Turner MRN: [id number] DOB: 1936-11-26   [name] was scheduled for a joint clinic visit today. Requested to cancel the appointments due to being busy this week. She prefers not to reschedule today but agreed to call next week with her availability.    Follow up plan: -[name] will call next week to reschedule a joint visit. -Will update her primary care provider.     [name] Health/[facility] Care Management [facility] [phone]

## 2021-02-15 ENCOUNTER — Telehealth: Payer: Self-pay

## 2021-02-15 NOTE — Telephone Encounter (Signed)
  Chronic Care Management   Outreach Note  02/15/2021 Name: Saharra Santo MRN: [id number] DOB: 1936/02/04  Primary Care Provider: Mar Daring, PA-C Reason for referral : Chronic Care Management   An unsuccessful telephone outreach was attempted today. Mrs. Trimmer is currently enrolled in the Chronic Care Management program. Attempted to reach her today to schedule a joint clinic visit with her primary care provider.   Follow Up Plan:  Phone rang multiple times without the option to leave a voice message.  A member of the care management team will attempt to reach Mrs. Brannock again within the next two weeks.    [name] Health/[facility] Care Management [facility] [phone]

## 2021-02-19 ENCOUNTER — Ambulatory Visit: Payer: Self-pay | Admitting: *Deleted

## 2021-02-19 DIAGNOSIS — K130 Diseases of lips: Secondary | ICD-10-CM

## 2021-02-19 NOTE — Telephone Encounter (Addendum)
Pt called in c/o a crusty place on right side of mouth that has spread to her chin, ear and inside her nose.   She has had this before and Anderson Malta has given her cream for it.   It has returned and is requesting something for it.   She has used Lotrisone and Mycostatin suspension.  She can be reached at [phone].  I sent my notes to [facility] for [employer], [place].   Reason for Disposition . Localized rash present > 7 days  Answer Assessment - Initial Assessment Questions 1. APPEARANCE of RASH: "Describe the rash."      Before Christmas had a place right corner of my mouth.   The rash then moved onto my chin.   I've never had a fever blister. Jennifer sent in a ointment and I was using it but the rash is now in my ear and in my nose The original place in the right side  Of my mouth has started 2. LOCATION: "Where is the rash located?"      Right corner of mouth it started.   Now I have a sore place in my ear and in my nose I noticed when I blew my nose.   The places feel crusty.  No itching or burning. I pulled my lower lip down and I see it in there.   It really hurt to eat my breakfast this morning. Anderson Malta has seen me for this before.   There is a thin film over the crusty sores.   I've had a change in my skin it's very thin since being in the hospital.   3. NUMBER: "How many spots are there?"      Crusty places that are spreading. 4. SIZE: "How big are the spots?" (Inches, centimeters or compare to size of a coin)      *No Answer* 5. ONSET: "When did the rash start?"      In the last few days. 6. ITCHING: "Does the rash itch?" If Yes, ask: "How bad is the itch?"  (Scale 1-10; or mild, moderate, severe)     No 7. PAIN: "Does the rash hurt?" If Yes, ask: "How bad is the pain?"  (Scale 1-10; or mild, moderate, severe)     No 8. OTHER SYMPTOMS: "Do you have any other symptoms?" (e.g., fever)     It's spreading 9. PREGNANCY: "Is there any chance you are  pregnant?" "When was your last menstrual period?"     N/A due to age  Protocols used: RASH OR REDNESS - LOCALIZED-A-AH

## 2021-02-20 MED ORDER — CLOTRIMAZOLE-BETAMETHASONE 1-0.05 % EX CREA: 1.0000 "application " | TOPICAL_CREAM | Freq: Two times a day (BID) | CUTANEOUS | 0 refills | Status: DC

## 2021-02-20 NOTE — Addendum Note (Signed)
Addended by: Ashley Royalty E on: 02/20/2021 10:20 AM   Modules accepted: Orders

## 2021-02-20 NOTE — Telephone Encounter (Signed)
Jenni Rx'd lotrisone cream for this previously. Ok to refill from her med list. Thanks!

## 2021-02-21 ENCOUNTER — Telehealth: Payer: Self-pay | Admitting: Physician Assistant

## 2021-02-21 MED ORDER — FLUCONAZOLE 150 MG PO TABS: 150.0000 mg | ORAL_TABLET | Freq: Once | ORAL | 0 refills | Status: AC

## 2021-02-21 NOTE — Telephone Encounter (Signed)
Diflucan sent in

## 2021-02-21 NOTE — Telephone Encounter (Signed)
Pt called and stated she ad fever blisters in the past and was prescribed clotrimazole-betamethasone (LOTRISONE) cream / Pt stated she called the pharamacy yesterday about some scabby spots she has in her ears and nose similar to what she had before on her chin and around her mouth and they advise her to ask her PCP for Diflucan / and not the cream above that was refilled/ please advise

## 2021-03-01 ENCOUNTER — Encounter: Payer: Self-pay | Admitting: Physician Assistant

## 2021-03-01 ENCOUNTER — Ambulatory Visit (INDEPENDENT_AMBULATORY_CARE_PROVIDER_SITE_OTHER): Payer: Medicare HMO | Admitting: Physician Assistant

## 2021-03-01 ENCOUNTER — Other Ambulatory Visit: Payer: Self-pay

## 2021-03-01 VITALS — BP 126/74 | HR 139 | Temp 99.7°F | Wt 116.0 lb

## 2021-03-01 DIAGNOSIS — J9622 Acute and chronic respiratory failure with hypercapnia: Secondary | ICD-10-CM

## 2021-03-01 DIAGNOSIS — J479 Bronchiectasis, uncomplicated: Secondary | ICD-10-CM

## 2021-03-01 DIAGNOSIS — I4891 Unspecified atrial fibrillation: Secondary | ICD-10-CM | POA: Diagnosis not present

## 2021-03-01 DIAGNOSIS — J449 Chronic obstructive pulmonary disease, unspecified: Secondary | ICD-10-CM

## 2021-03-01 DIAGNOSIS — J9621 Acute and chronic respiratory failure with hypoxia: Secondary | ICD-10-CM | POA: Diagnosis not present

## 2021-03-01 NOTE — Patient Instructions (Signed)
Checking labs today  Make sure to take Verapamil 120mg  full tab daily  Call pulmonology and cardiology to set follow up appointments  If Shingrix (new shingles) vaccine is desired, has to be done at your pharmacy

## 2021-03-01 NOTE — Progress Notes (Signed)
Established patient visit   Patient: Jackie Turner   DOB: 1936/02/06   85 y.o. Female  MRN: [id number] Visit Date: 03/01/2021  Today's healthcare provider: Mar Daring, PA-C   Chief Complaint  Patient presents with  . COPD  . Anemia   Subjective    HPI   Follow up for anemia  The patient was last seen for this 6 months ago. Changes made at last visit include checking labs.  She reports excellent compliance with treatment. She feels that condition is Improved. She is not having side effects.   -----------------------------------------------------------------------------------------     Patient Active Problem List   Diagnosis Date Noted  . Memory loss 11/21/2020  . Polyp of colon   . Acute blood loss anemia 08/09/2020  . GI bleeding 08/04/2020  . Depression, major, single episode, in partial remission ([place]) 06/22/2020  . Atrial fibrillation with RVR ([facility]) 06/13/2020  . Acute on chronic respiratory failure with hypoxia and hypercapnia ([place]) 06/13/2020  . Right hand pain 06/13/2020  . Fall at home, initial encounter 06/13/2020  . COPD (chronic obstructive pulmonary disease) ([place]) 06/13/2020  . Bronchiectasis without complication ([place]) 88/82/8003  . Lymphedema 05/17/2019  . Chronic venous insufficiency 05/17/2019  . Swelling of limb 02/10/2018  . Varicose veins of leg with swelling, bilateral 02/10/2018  . Squamous cell carcinoma 09/22/2017  . Hypoxia 09/27/2015  . Abnormal CXR 09/27/2015  . Asthma with allergic rhinitis 08/31/2015  . Back ache 08/31/2015  . Chronic interstitial cystitis 08/31/2015  . Grief reaction 08/31/2015  . Hyperlipidemia 06/26/2015  . Migraine 06/26/2015  . Asthma 06/26/2015  . IBS (irritable bowel syndrome) 06/26/2015  . Osteoporosis 06/26/2015  . Fatigue 06/26/2015  . Lumbar radiculopathy 04/27/2013  . Urinary urgency 01/08/2012  . Frequency of urination 01/08/2012   Past Medical History:  Diagnosis Date  .  Allergy   . Asthma   . Hyperlipidemia   . Osteoporosis    Social History   Tobacco Use  . Smoking status: Never Smoker  . Smokeless tobacco: Never Used  Vaping Use  . Vaping Use: Never used  Substance Use Topics  . Alcohol use: No  . Drug use: No   Allergies  Allergen Reactions  . Nsaids Other (See Comments)    History of gastric ulcer  . Amoxicillin Nausea Only and Rash  . Ibandronic Acid Other (See Comments)    Muscle weakness - advised not to take  **BONIVA** - drug name  . Actonel  [Risedronate] Other (See Comments)    Gastric ulcers  . Antihistamines, Chlorpheniramine-Type   . Aspirin     Gastric ulcer  . Buspar [Buspirone] Other (See Comments)    Pt does not remember reaction   . Butalbital-Aspirin-Caffeine Other (See Comments)    Other reaction(s): Unknown  . Clarithromycin Other (See Comments)    Bloating  *BIAXIN*  . Codeine   . Gatifloxacin Other (See Comments)  . Hydrocodone-Guaifenesin Nausea Only    CODICLEAR DH STYRUP   . Moxifloxacin   . Oxycodone Other (See Comments)    Dizziness  . Penicillins Other (See Comments)  . Risedronate Sodium     Gastric ulcer  . Tylenol With Codeine #3  [Acetaminophen-Codeine]     Other reaction(s): Unknown  . Raloxifene Other (See Comments)    Bloating Bloating  *EVISTA*     Medications: Outpatient Medications Prior to Visit  Medication Sig  . calcium carbonate (OSCAL) 1500 (600 Ca) MG TABS tablet Take 1 tablet by mouth  2 (two) times daily with a meal.   . dicyclomine (BENTYL) 10 MG capsule Take 1 capsule (10 mg total) by mouth 3 (three) times daily as needed for spasms.  Marland Kitchen doxepin (SINEQUAN) 10 MG capsule TAKE 3 CAPSULES BY MOUTH AT BEDTIME  . feeding supplement, ENSURE ENLIVE, (ENSURE ENLIVE) LIQD Take 237 mLs by mouth 2 (two) times daily between meals.  . ferrous gluconate (FERGON) 324 MG tablet Take 1 tablet (324 mg total) by mouth daily with breakfast.  . fluticasone (FLONASE) 50 MCG/ACT nasal spray  USE 2 SPRAYS IN EACH NOSTRIL ONCE A DAY.  Marland Kitchen Fluticasone-Umeclidin-Vilant (TRELEGY ELLIPTA) 100-62.5-25 MCG/INH AEPB Inhale 1 puff into the lungs daily.  . furosemide (LASIX) 40 MG tablet Take 1 tablet (40 mg total) by mouth daily as needed for edema.  Marland Kitchen nystatin (MYCOSTATIN) 100000 UNIT/ML suspension TAKE 5 MLS BY MOUTH 4 TIMES A DAY.  Marland Kitchen oxybutynin (DITROPAN XL) 15 MG 24 hr tablet Take 1 tablet (15 mg total) by mouth at bedtime.  . pentosan polysulfate (ELMIRON) 100 MG capsule Take 1 capsule (100 mg total) by mouth 2 (two) times daily as needed (urinary pain).  . traMADol (ULTRAM) 50 MG tablet TAKE 1 TABLET BY MOUTH EVERY 8 HOURS AS NEEDED FOR PAIN  . Urea 40 % LOTN Apply 1 application topically daily as needed (dry skin).   . VENTOLIN HFA 108 (90 Base) MCG/ACT inhaler INHALE TWO PUFFS INTO THE LUNGS EVERY 4 HOURS AS NEEDED FOR WHEEZING OR SHORTNESS OF BREATH (Patient taking differently: Inhale 2 puffs into the lungs every 4 (four) hours as needed for wheezing or shortness of breath.)  . verapamil (CALAN-SR) 120 MG CR tablet Take 1 tablet (120 mg total) by mouth at bedtime.  . vitamin B-12 1000 MCG tablet Take 1 tablet (1,000 mcg total) by mouth daily.  Marland Kitchen albuterol (PROVENTIL) (2.5 MG/3ML) 0.083% nebulizer solution Take 3 mLs (2.5 mg total) by nebulization every 6 (six) hours as needed for wheezing or shortness of breath. (Patient not taking: No sig reported)   No facility-administered medications prior to visit.    Review of Systems  Constitutional: Negative.   Respiratory: Positive for shortness of breath.   Cardiovascular: Negative.   Gastrointestinal: Negative.   Musculoskeletal: Positive for back pain.  Neurological: Positive for weakness.        Objective    BP 126/74 (BP Location: Right Arm, Patient Position: Sitting, Cuff Size: Large)   Pulse (!) 139   Temp 99.7 F (37.6 C) (Oral)   Wt 116 lb (52.6 kg)   BMI 23.43 kg/m    Physical Exam Vitals reviewed.  Constitutional:       General: She is not in acute distress.    Appearance: Normal appearance. She is well-developed. She is not ill-appearing or diaphoretic.  Cardiovascular:     Rate and Rhythm: Tachycardia present. Rhythm irregularly irregular.     Pulses: Normal pulses.     Heart sounds: Normal heart sounds. No murmur heard. No friction rub. No gallop.   Pulmonary:     Effort: Pulmonary effort is normal. No respiratory distress.     Breath sounds: Normal breath sounds. No wheezing or rales.  Musculoskeletal:     Cervical back: Normal range of motion and neck supple.  Lymphadenopathy:     Cervical: No cervical adenopathy.  Neurological:     Mental Status: She is alert.       Results for orders placed or performed in visit on 03/01/21  CBC w/Diff/Platelet  Result Value Ref Range   WBC 6.3 3.4 - 10.8 x10E3/uL   RBC 3.91 3.77 - 5.28 x10E6/uL   Hemoglobin 7.4 (L) 11.1 - 15.9 g/dL   Hematocrit 26.5 (L) 34.0 - 46.6 %   MCV 68 (L) 79 - 97 fL   MCH 18.9 (L) 26.6 - 33.0 pg   MCHC 27.9 (L) 31.5 - 35.7 g/dL   RDW 15.7 (H) 11.7 - 15.4 %   Platelets 339 150 - 450 x10E3/uL   Neutrophils 65 Not Estab. %   Lymphs 18 Not Estab. %   Monocytes 12 Not Estab. %   Eos 3 Not Estab. %   Basos 1 Not Estab. %   Neutrophils Absolute 4.1 1.4 - 7.0 x10E3/uL   Lymphocytes Absolute 1.1 0.7 - 3.1 x10E3/uL   Monocytes Absolute 0.7 0.1 - 0.9 x10E3/uL   EOS (ABSOLUTE) 0.2 0.0 - 0.4 x10E3/uL   Basophils Absolute 0.1 0.0 - 0.2 x10E3/uL   Immature Granulocytes 1 Not Estab. %   Immature Grans (Abs) 0.0 0.0 - 0.1 x10E3/uL  Comprehensive Metabolic Panel (CMET)  Result Value Ref Range   Glucose 89 65 - 99 mg/dL   BUN 23 8 - 27 mg/dL   Creatinine, Ser 0.69 0.57 - 1.00 mg/dL   eGFR 86 >59 mL/min/1.73   BUN/Creatinine Ratio 33 (H) 12 - 28   Sodium 143 134 - 144 mmol/L   Potassium 5.0 3.5 - 5.2 mmol/L   Chloride 101 96 - 106 mmol/L   CO2 31 (H) 20 - 29 mmol/L   Calcium 9.7 8.7 - 10.3 mg/dL   Total Protein 6.5 6.0 - 8.5  g/dL   Albumin 4.1 3.6 - 4.6 g/dL   Globulin, Total 2.4 1.5 - 4.5 g/dL   Albumin/Globulin Ratio 1.7 1.2 - 2.2   Bilirubin Total <0.2 0.0 - 1.2 mg/dL   Alkaline Phosphatase 77 44 - 121 IU/L   AST 19 0 - 40 IU/L   ALT 16 0 - 32 IU/L  TSH  Result Value Ref Range   TSH 3.130 0.450 - 4.500 uIU/mL  Lipid Panel With LDL/HDL Ratio  Result Value Ref Range   Cholesterol, Total 158 100 - 199 mg/dL   Triglycerides 112 0 - 149 mg/dL   HDL 51 >39 mg/dL   VLDL Cholesterol Cal 20 5 - 40 mg/dL   LDL Chol Calc (NIH) 87 0 - 99 mg/dL   LDL/HDL Ratio 1.7 0.0 - 3.2 ratio    Assessment & Plan     1. Atrial fibrillation with RVR ([place]) Tachycardic today but patient has not been taking verapamil correctly. She was only taking 1/2 tab of the 17m. Advised to start taking the whole tab for HR control from the atrial fibrillation. Continue f/u with Cardiology. Return to our office in 3 months.  - CBC w/Diff/Platelet - Comprehensive Metabolic Panel (CMET) - TSH - Lipid Panel With LDL/HDL Ratio  2. Chronic obstructive pulmonary disease, unspecified COPD type ([place] On 2-3L O2 via [place] continuously. F/U with pulmonology as directed. Will check labs as below and f/u pending results. - CBC w/Diff/Platelet - Comprehensive Metabolic Panel (CMET) - TSH - Lipid Panel With LDL/HDL Ratio  3. Bronchiectasis without complication ([place] See above medical treatment plan. - CBC w/Diff/Platelet - Comprehensive Metabolic Panel (CMET) - TSH - Lipid Panel With LDL/HDL Ratio  4. Acute on chronic respiratory failure with hypoxia and hypercapnia ([place] See above medical treatment plan. - CBC w/Diff/Platelet - Comprehensive Metabolic Panel (CMET) - TSH - Lipid Panel With  LDL/HDL Ratio   No follow-ups on file.      Reynolds Bowl, PA-C, have reviewed all documentation for this visit. The documentation on 03/04/21 for the exam, diagnosis, procedures, and orders are all accurate and complete.   Rubye Beach  [facility] [phone] (phone) [phone] (fax)  [place]

## 2021-03-02 LAB — CBC WITH DIFFERENTIAL/PLATELET
Basophils Absolute: 0.1 10*3/uL (ref 0.0–0.2)
Basos: 1 %
EOS (ABSOLUTE): 0.2 10*3/uL (ref 0.0–0.4)
Eos: 3 %
Hematocrit: 26.5 % — ABNORMAL LOW (ref 34.0–46.6)
Hemoglobin: 7.4 g/dL — ABNORMAL LOW (ref 11.1–15.9)
Immature Grans (Abs): 0 10*3/uL (ref 0.0–0.1)
Immature Granulocytes: 1 %
Lymphocytes Absolute: 1.1 10*3/uL (ref 0.7–3.1)
Lymphs: 18 %
MCH: 18.9 pg — ABNORMAL LOW (ref 26.6–33.0)
MCHC: 27.9 g/dL — ABNORMAL LOW (ref 31.5–35.7)
MCV: 68 fL — ABNORMAL LOW (ref 79–97)
Monocytes Absolute: 0.7 10*3/uL (ref 0.1–0.9)
Monocytes: 12 %
Neutrophils Absolute: 4.1 10*3/uL (ref 1.4–7.0)
Neutrophils: 65 %
Platelets: 339 10*3/uL (ref 150–450)
RBC: 3.91 x10E6/uL (ref 3.77–5.28)
RDW: 15.7 % — ABNORMAL HIGH (ref 11.7–15.4)
WBC: 6.3 10*3/uL (ref 3.4–10.8)

## 2021-03-02 LAB — COMPREHENSIVE METABOLIC PANEL
ALT: 16 IU/L (ref 0–32)
AST: 19 IU/L (ref 0–40)
Albumin/Globulin Ratio: 1.7 (ref 1.2–2.2)
Albumin: 4.1 g/dL (ref 3.6–4.6)
Alkaline Phosphatase: 77 IU/L (ref 44–121)
BUN/Creatinine Ratio: 33 — ABNORMAL HIGH (ref 12–28)
BUN: 23 mg/dL (ref 8–27)
Bilirubin Total: <0.2 mg/dL (ref 0.0–1.2)
CO2: 31 mmol/L — ABNORMAL HIGH (ref 20–29)
Calcium: 9.7 mg/dL (ref 8.7–10.3)
Chloride: 101 mmol/L (ref 96–106)
Creatinine, Ser: 0.69 mg/dL (ref 0.57–1.00)
Globulin, Total: 2.4 g/dL (ref 1.5–4.5)
Glucose: 89 mg/dL (ref 65–99)
Potassium: 5 mmol/L (ref 3.5–5.2)
Sodium: 143 mmol/L (ref 134–144)
Total Protein: 6.5 g/dL (ref 6.0–8.5)
eGFR: 86 mL/min/{1.73_m2} (ref >59)

## 2021-03-02 LAB — TSH: TSH: 3.13 u[IU]/mL (ref 0.450–4.500)

## 2021-03-02 LAB — LIPID PANEL WITH LDL/HDL RATIO
Cholesterol, Total: 158 mg/dL (ref 100–199)
HDL: 51 mg/dL (ref >39)
LDL Chol Calc (NIH): 87 mg/dL (ref 0–99)
LDL/HDL Ratio: 1.7 ratio (ref 0.0–3.2)
Triglycerides: 112 mg/dL (ref 0–149)
VLDL Cholesterol Cal: 20 mg/dL (ref 5–40)

## 2021-03-04 ENCOUNTER — Encounter: Payer: Self-pay | Admitting: Physician Assistant

## 2021-03-08 ENCOUNTER — Telehealth: Payer: Self-pay

## 2021-03-08 DIAGNOSIS — D649 Anemia, unspecified: Secondary | ICD-10-CM

## 2021-03-08 NOTE — Telephone Encounter (Signed)
I called pt and pt verbalized understanding of information below. Referral has been placed.

## 2021-03-08 NOTE — Telephone Encounter (Signed)
-----   Message from Mar Daring, [place] sent at 03/08/2021  1:17 PM EDT ----- Vickii Chafe,  Your hemoglobin has dropped down to 7.4 again. Would recommend referral to hematology. All other labs were normal and stable.   Best Wishes, Grace Bushy, PAC

## 2021-03-14 ENCOUNTER — Telehealth: Payer: Self-pay

## 2021-03-14 DIAGNOSIS — B370 Candidal stomatitis: Secondary | ICD-10-CM

## 2021-03-14 NOTE — Telephone Encounter (Signed)
Copied from [place] [id number]. Topic: General - Other >> Mar 14, 2021  2:03 PM Pawlus, [name] wrote: Reason for CRM: Pt has rash inside and around her mouth, Pt stated Anderson Malta gave her 1 pill of diflucan and stated this really helped but she believes she may need another pill. Pt stated she can hardly eat because the rash is so bad. Pt wanted to know if something could be sent in for her.

## 2021-03-15 MED ORDER — FLUCONAZOLE 150 MG PO TABS: 150.0000 mg | ORAL_TABLET | Freq: Once | ORAL | 0 refills | Status: AC

## 2021-03-15 NOTE — Telephone Encounter (Signed)
[facility]

## 2021-03-15 NOTE — Addendum Note (Signed)
Addended by: Mar Daring on: 03/15/2021 10:54 AM   Modules accepted: Orders

## 2021-03-19 ENCOUNTER — Inpatient Hospital Stay
Admission: EM | Admit: 2021-03-19 | Discharge: 2021-03-21 | DRG: 309 | Disposition: A | Discharge status: Home / Self Care | Payer: Medicare HMO | Attending: Internal Medicine | Admitting: Internal Medicine

## 2021-03-19 ENCOUNTER — Emergency Department: Payer: Medicare HMO

## 2021-03-19 ENCOUNTER — Inpatient Hospital Stay: Payer: Medicare HMO

## 2021-03-19 ENCOUNTER — Inpatient Hospital Stay: Payer: Medicare HMO | Attending: Internal Medicine | Admitting: Internal Medicine

## 2021-03-19 ENCOUNTER — Other Ambulatory Visit: Payer: Self-pay

## 2021-03-19 ENCOUNTER — Encounter: Payer: Self-pay | Admitting: Internal Medicine

## 2021-03-19 DIAGNOSIS — Z8249 Family history of ischemic heart disease and other diseases of the circulatory system: Secondary | ICD-10-CM

## 2021-03-19 DIAGNOSIS — I4811 Longstanding persistent atrial fibrillation: Secondary | ICD-10-CM | POA: Diagnosis not present

## 2021-03-19 DIAGNOSIS — Z8711 Personal history of peptic ulcer disease: Secondary | ICD-10-CM | POA: Diagnosis not present

## 2021-03-19 DIAGNOSIS — Z803 Family history of malignant neoplasm of breast: Secondary | ICD-10-CM | POA: Diagnosis not present

## 2021-03-19 DIAGNOSIS — Z79899 Other long term (current) drug therapy: Secondary | ICD-10-CM

## 2021-03-19 DIAGNOSIS — I272 Pulmonary hypertension, unspecified: Secondary | ICD-10-CM | POA: Diagnosis present

## 2021-03-19 DIAGNOSIS — Z90710 Acquired absence of both cervix and uterus: Secondary | ICD-10-CM

## 2021-03-19 DIAGNOSIS — D509 Iron deficiency anemia, unspecified: Secondary | ICD-10-CM | POA: Diagnosis present

## 2021-03-19 DIAGNOSIS — E785 Hyperlipidemia, unspecified: Secondary | ICD-10-CM | POA: Diagnosis present

## 2021-03-19 DIAGNOSIS — E875 Hyperkalemia: Secondary | ICD-10-CM | POA: Diagnosis present

## 2021-03-19 DIAGNOSIS — I10 Essential (primary) hypertension: Secondary | ICD-10-CM | POA: Diagnosis present

## 2021-03-19 DIAGNOSIS — I4891 Unspecified atrial fibrillation: Secondary | ICD-10-CM | POA: Diagnosis present

## 2021-03-19 DIAGNOSIS — J439 Emphysema, unspecified: Secondary | ICD-10-CM | POA: Diagnosis present

## 2021-03-19 DIAGNOSIS — J441 Chronic obstructive pulmonary disease with (acute) exacerbation: Secondary | ICD-10-CM

## 2021-03-19 DIAGNOSIS — Z20822 Contact with and (suspected) exposure to covid-19: Secondary | ICD-10-CM | POA: Diagnosis present

## 2021-03-19 DIAGNOSIS — E611 Iron deficiency: Secondary | ICD-10-CM | POA: Insufficient documentation

## 2021-03-19 DIAGNOSIS — M810 Age-related osteoporosis without current pathological fracture: Secondary | ICD-10-CM | POA: Diagnosis present

## 2021-03-19 DIAGNOSIS — Z9981 Dependence on supplemental oxygen: Secondary | ICD-10-CM

## 2021-03-19 DIAGNOSIS — R195 Other fecal abnormalities: Secondary | ICD-10-CM | POA: Diagnosis present

## 2021-03-19 DIAGNOSIS — D649 Anemia, unspecified: Secondary | ICD-10-CM

## 2021-03-19 DIAGNOSIS — J9611 Chronic respiratory failure with hypoxia: Secondary | ICD-10-CM | POA: Diagnosis present

## 2021-03-19 DIAGNOSIS — Z8719 Personal history of other diseases of the digestive system: Secondary | ICD-10-CM

## 2021-03-19 LAB — COMPREHENSIVE METABOLIC PANEL
ALT: 16 U/L (ref 0–44)
AST: 32 U/L (ref 15–41)
Albumin: 3.9 g/dL (ref 3.5–5.0)
Alkaline Phosphatase: 60 U/L (ref 38–126)
Anion gap: 8 (ref 5–15)
BUN: 31 mg/dL — ABNORMAL HIGH (ref 8–23)
CO2: 32 mmol/L (ref 22–32)
Calcium: 9.4 mg/dL (ref 8.9–10.3)
Chloride: 100 mmol/L (ref 98–111)
Creatinine, Ser: 0.66 mg/dL (ref 0.44–1.00)
GFR, Estimated: >60 mL/min (ref >60)
Glucose, Bld: 141 mg/dL — ABNORMAL HIGH (ref 70–99)
Potassium: 5.3 mmol/L — ABNORMAL HIGH (ref 3.5–5.1)
Sodium: 140 mmol/L (ref 135–145)
Total Bilirubin: 0.5 mg/dL (ref 0.3–1.2)
Total Protein: 7 g/dL (ref 6.5–8.1)

## 2021-03-19 LAB — FERRITIN: Ferritin: 4 ng/mL — ABNORMAL LOW (ref 11–307)

## 2021-03-19 LAB — CBC WITH DIFFERENTIAL/PLATELET
Abs Immature Granulocytes: 0.03 10*3/uL (ref 0.00–0.07)
Basophils Absolute: 0.1 10*3/uL (ref 0.0–0.1)
Basophils Relative: 1 %
Eosinophils Absolute: 0.2 10*3/uL (ref 0.0–0.5)
Eosinophils Relative: 2 %
HCT: 26.6 % — ABNORMAL LOW (ref 36.0–46.0)
Hemoglobin: 7.2 g/dL — ABNORMAL LOW (ref 12.0–15.0)
Immature Granulocytes: 0 %
Lymphocytes Relative: 13 %
Lymphs Abs: 1.1 10*3/uL (ref 0.7–4.0)
MCH: 18.7 pg — ABNORMAL LOW (ref 26.0–34.0)
MCHC: 27.1 g/dL — ABNORMAL LOW (ref 30.0–36.0)
MCV: 69.1 fL — ABNORMAL LOW (ref 80.0–100.0)
Monocytes Absolute: 0.7 10*3/uL (ref 0.1–1.0)
Monocytes Relative: 9 %
Neutro Abs: 6.1 10*3/uL (ref 1.7–7.7)
Neutrophils Relative %: 75 %
Platelets: 361 10*3/uL (ref 150–400)
RBC: 3.85 MIL/uL — ABNORMAL LOW (ref 3.87–5.11)
RDW: 18.4 % — ABNORMAL HIGH (ref 11.5–15.5)
WBC: 8.2 10*3/uL (ref 4.0–10.5)
nRBC: 0 % (ref 0.0–0.2)

## 2021-03-19 LAB — RETICULOCYTES
Immature Retic Fract: 29.7 % — ABNORMAL HIGH (ref 2.3–15.9)
RBC.: 3.93 MIL/uL (ref 3.87–5.11)
Retic Count, Absolute: 53.8 10*3/uL (ref 19.0–186.0)
Retic Ct Pct: 1.4 % (ref 0.4–3.1)

## 2021-03-19 LAB — HEMOGLOBIN: Hemoglobin: 7.2 g/dL — ABNORMAL LOW (ref 12.0–15.0)

## 2021-03-19 LAB — TYPE AND SCREEN

## 2021-03-19 LAB — IRON AND TIBC
Iron: 11 ug/dL — ABNORMAL LOW (ref 28–170)
Saturation Ratios: 2 % — ABNORMAL LOW (ref 10.4–31.8)
TIBC: 721 ug/dL — ABNORMAL HIGH (ref 250–450)
UIBC: 710 ug/dL

## 2021-03-19 LAB — TROPONIN I (HIGH SENSITIVITY)
Troponin I (High Sensitivity): 8 ng/L (ref <18)
Troponin I (High Sensitivity): 8 ng/L (ref <18)

## 2021-03-19 LAB — VITAMIN B12: Vitamin B-12: 236 pg/mL (ref 180–914)

## 2021-03-19 LAB — SARS CORONAVIRUS 2 (TAT 6-24 HRS): SARS Coronavirus 2: NEGATIVE

## 2021-03-19 LAB — TSH: TSH: 1.7 u[IU]/mL (ref 0.350–4.500)

## 2021-03-19 LAB — MAGNESIUM: Magnesium: 1.9 mg/dL (ref 1.7–2.4)

## 2021-03-19 LAB — BRAIN NATRIURETIC PEPTIDE: B Natriuretic Peptide: 191.5 pg/mL — ABNORMAL HIGH (ref 0.0–100.0)

## 2021-03-19 LAB — PREPARE RBC (CROSSMATCH)

## 2021-03-19 LAB — FOLATE: Folate: 23 ng/mL (ref >5.9)

## 2021-03-19 MED ORDER — METHYLPREDNISOLONE SODIUM SUCC 125 MG IJ SOLR
125.0000 mg | Freq: Once | INTRAMUSCULAR | Status: AC
  Administered 2021-03-19: 125 mg via INTRAVENOUS
  Filled 2021-03-19: qty 2

## 2021-03-19 MED ORDER — ALBUTEROL SULFATE (2.5 MG/3ML) 0.083% IN NEBU: 2.5000 mg | INHALATION_SOLUTION | RESPIRATORY_TRACT | Status: DC | PRN

## 2021-03-19 MED ORDER — PREDNISONE 20 MG PO TABS
40.0000 mg | ORAL_TABLET | Freq: Every day | ORAL | Status: DC
  Administered 2021-03-21: 40 mg via ORAL
  Filled 2021-03-19: qty 2

## 2021-03-19 MED ORDER — IPRATROPIUM-ALBUTEROL 0.5-2.5 (3) MG/3ML IN SOLN
6.0000 mL | Freq: Once | RESPIRATORY_TRACT | Status: AC
  Administered 2021-03-19: 6 mL via RESPIRATORY_TRACT
  Filled 2021-03-19: qty 6

## 2021-03-19 MED ORDER — ONDANSETRON HCL 4 MG/2ML IJ SOLN: 4.0000 mg | Freq: Four times a day (QID) | INTRAMUSCULAR | Status: DC | PRN

## 2021-03-19 MED ORDER — IPRATROPIUM-ALBUTEROL 0.5-2.5 (3) MG/3ML IN SOLN
3.0000 mL | Freq: Four times a day (QID) | RESPIRATORY_TRACT | Status: DC
  Administered 2021-03-19 – 2021-03-20 (×5): 3 mL via RESPIRATORY_TRACT
  Filled 2021-03-19 (×5): qty 3

## 2021-03-19 MED ORDER — SODIUM CHLORIDE 0.9% FLUSH
3.0000 mL | Freq: Two times a day (BID) | INTRAVENOUS | Status: DC
  Administered 2021-03-19 – 2021-03-21 (×4): 3 mL via INTRAVENOUS

## 2021-03-19 MED ORDER — PANTOPRAZOLE SODIUM 40 MG IV SOLR
40.0000 mg | Freq: Two times a day (BID) | INTRAVENOUS | Status: DC
  Administered 2021-03-19 – 2021-03-21 (×4): 40 mg via INTRAVENOUS
  Filled 2021-03-19 (×4): qty 40

## 2021-03-19 MED ORDER — DILTIAZEM LOAD VIA INFUSION
20.0000 mg | Freq: Once | INTRAVENOUS | Status: AC
  Administered 2021-03-19: 20 mg via INTRAVENOUS
  Filled 2021-03-19: qty 20

## 2021-03-19 MED ORDER — GUAIFENESIN ER 600 MG PO TB12
600.0000 mg | ORAL_TABLET | Freq: Two times a day (BID) | ORAL | Status: DC
  Administered 2021-03-19 – 2021-03-21 (×4): 600 mg via ORAL
  Filled 2021-03-19 (×4): qty 1

## 2021-03-19 MED ORDER — DILTIAZEM HCL-DEXTROSE 125-5 MG/125ML-% IV SOLN (PREMIX)
5.0000 mg/h | INTRAVENOUS | Status: DC
  Administered 2021-03-19 – 2021-03-20 (×2): 5 mg/h via INTRAVENOUS
  Filled 2021-03-19 (×2): qty 125

## 2021-03-19 MED ORDER — METHYLPREDNISOLONE SODIUM SUCC 40 MG IJ SOLR
40.0000 mg | Freq: Two times a day (BID) | INTRAMUSCULAR | Status: AC
  Administered 2021-03-20 (×2): 40 mg via INTRAVENOUS
  Filled 2021-03-19 (×2): qty 1

## 2021-03-19 MED ORDER — SODIUM CHLORIDE 0.9% IV SOLUTION
Freq: Once | INTRAVENOUS | Status: DC
  Filled 2021-03-19: qty 250

## 2021-03-19 MED ORDER — VERAPAMIL HCL ER 180 MG PO TBCR
180.0000 mg | EXTENDED_RELEASE_TABLET | Freq: Every day | ORAL | Status: DC
  Administered 2021-03-19: 180 mg via ORAL
  Filled 2021-03-19 (×4): qty 1

## 2021-03-19 MED ORDER — ONDANSETRON HCL 4 MG PO TABS: 4.0000 mg | ORAL_TABLET | Freq: Four times a day (QID) | ORAL | Status: DC | PRN

## 2021-03-19 NOTE — ED Triage Notes (Addendum)
[facility] with c/o something wrong with her heart. Pt states she went for doctor appt and was advised to come here and get checked out.  Pt states SOB but denies any CP. Pt wears 2L [place] at home.  Pt states increased weakness and more SOB with exertion

## 2021-03-19 NOTE — ED Provider Notes (Signed)
[facility] Emergency Department Provider Note ____________________________________________   Event Date/Time   First MD Initiated Contact with Patient 03/19/21 1524     (approximate)  I have reviewed the triage vital signs and the nursing notes.  HISTORY  Chief Complaint Atrial Fibrillation   HPI Jackie Turner is a 85 y.o. femalewho presents to the ED for evaluation of   Chart review indicates history of COPD on 2 L home O2, A. fib not on Eliquis, history of iron deficiency anemia and lower GI bleeding with a large polyp at her ileocecal valve.  Biopsies with tubular adenoma.  Was following up with oncology this morning when she was noted to be tachycardic in A. fib with RVR and was sent to the ED for evaluation.  Follows with Dr. Saralyn Pilar with cardiology, last seen 6 months ago.  Patient presents to the ED from local clinic due to rapid A. fib in the clinic.  She reports that over the past 3-4 days she has had increasing shortness of breath, dyspnea on exertion when she walks to the mailbox.  She denies increased sputum production, fevers, pain such as chest or abdominal pain.  Denies any emesis, nausea, diarrhea or dysuria.  Denies increased lower extremity swelling.  Denies recent blocks or steroids.  Denies chest pain or palpitations.  Past Medical History:  Diagnosis Date  . Allergy   . Asthma   . Hyperlipidemia   . Osteoporosis     Patient Active Problem List   Diagnosis Date Noted  . Microcytic anemia 03/19/2021  . Iron deficiency 03/19/2021  . Memory loss 11/21/2020  . Polyp of colon   . Acute blood loss anemia 08/09/2020  . GI bleeding 08/04/2020  . Depression, major, single episode, in partial remission ([place]) 06/22/2020  . Atrial fibrillation with RVR ([place]) 06/13/2020  . Acute on chronic respiratory failure with hypoxia and hypercapnia ([place]) 06/13/2020  . Right hand pain 06/13/2020  . Fall at home, initial encounter 06/13/2020   . COPD (chronic obstructive pulmonary disease) ([place]) 06/13/2020  . Bronchiectasis without complication ([place]) [id number]/9562  . Lymphedema 05/17/2019  . Chronic venous insufficiency 05/17/2019  . Swelling of limb 02/10/2018  . Varicose veins of leg with swelling, bilateral 02/10/2018  . Squamous cell carcinoma 09/22/2017  . Hypoxia 09/27/2015  . Abnormal CXR 09/27/2015  . Asthma with allergic rhinitis 08/31/2015  . Back ache 08/31/2015  . Chronic interstitial cystitis 08/31/2015  . Grief reaction 08/31/2015  . Hyperlipidemia 06/26/2015  . Migraine 06/26/2015  . Asthma 06/26/2015  . IBS (irritable bowel syndrome) 06/26/2015  . Osteoporosis 06/26/2015  . Fatigue 06/26/2015  . Lumbar radiculopathy 04/27/2013  . Urinary urgency 01/08/2012  . Frequency of urination 01/08/2012    Past Surgical History:  Procedure Laterality Date  . ABDOMINAL HYSTERECTOMY  2003  . BREAST BIOPSY  1976, 1978   benign  . carpal tunnel repair Bilateral    R 1990; L 1997  . COLONOSCOPY WITH PROPOFOL N/A 08/14/2020   Procedure: COLONOSCOPY WITH PROPOFOL;  Surgeon: Lucilla Lame, MD;  Location: [facility] ENDOSCOPY;  Service: Endoscopy;  Laterality: N/A;  . ESOPHAGOGASTRODUODENOSCOPY N/A 08/12/2020   Procedure: ESOPHAGOGASTRODUODENOSCOPY (EGD);  Surgeon: Lin Landsman, MD;  Location: [facility] ENDOSCOPY;  Service: Gastroenterology;  Laterality: N/A;  . EYE SURGERY Bilateral    cataract extraction  . HAND TENDON SURGERY Right    Trigger finger  . IR RADIOLOGIST EVAL & MGMT  06/13/2017  . IR VERTEBROPLASTY CERV/THOR BX INC UNI/BIL INC/INJECT/IMAGING  06/17/2017  .  TONSILLECTOMY  1961    Prior to Admission medications   Medication Sig Start Date End Date Taking? Authorizing Provider  albuterol (PROVENTIL) (2.5 MG/3ML) 0.083% nebulizer solution Take 3 mLs (2.5 mg total) by nebulization every 6 (six) hours as needed for wheezing or shortness of breath. Patient not taking: No sig reported 09/19/20   Flora Lipps, MD   calcium carbonate (OSCAL) 1500 (600 Ca) MG TABS tablet Take 1 tablet by mouth 2 (two) times daily with a meal.  06/22/20   Burnette, Clearnce Sorrel, PA-C  dicyclomine (BENTYL) 10 MG capsule Take 1 capsule (10 mg total) by mouth 3 (three) times daily as needed for spasms. 06/22/20   Mar Daring, PA-C  doxepin (SINEQUAN) 10 MG capsule TAKE 3 CAPSULES BY MOUTH AT BEDTIME 01/03/21   Mar Daring, PA-C  feeding supplement, ENSURE ENLIVE, (ENSURE ENLIVE) LIQD Take 237 mLs by mouth 2 (two) times daily between meals. 06/16/20   Hongalgi, Lenis Dickinson, MD  ferrous gluconate (FERGON) 324 MG tablet Take 1 tablet (324 mg total) by mouth daily with breakfast. Patient not taking: Reported on 03/19/2021 08/25/20   Mar Daring, PA-C  fluticasone [facility]) 50 MCG/ACT nasal spray USE 2 SPRAYS IN EACH NOSTRIL ONCE A DAY. 01/15/21   Mar Daring, PA-C  Fluticasone-Umeclidin-Vilant (TRELEGY ELLIPTA) 100-62.5-25 MCG/INH AEPB Inhale 1 puff into the lungs daily. 06/22/20   Mar Daring, PA-C  furosemide (LASIX) 40 MG tablet Take 1 tablet (40 mg total) by mouth daily as needed for edema. 10/30/20   Mar Daring, PA-C  nystatin (MYCOSTATIN) 100000 UNIT/ML suspension TAKE 5 MLS BY MOUTH 4 TIMES A DAY. 10/30/20   Mar Daring, PA-C  oxybutynin (DITROPAN XL) 15 MG 24 hr tablet Take 1 tablet (15 mg total) by mouth at bedtime. 06/22/20   Mar Daring, PA-C  pentosan polysulfate (ELMIRON) 100 MG capsule Take 1 capsule (100 mg total) by mouth 2 (two) times daily as needed (urinary pain). 10/30/20   Mar Daring, PA-C  traMADol (ULTRAM) 50 MG tablet TAKE 1 TABLET BY MOUTH EVERY 8 HOURS AS NEEDED FOR PAIN 10/06/20   Mar Daring, PA-C  Urea 40 % LOTN Apply 1 application topically daily as needed (dry skin).  06/22/20   Mar Daring, PA-C  VENTOLIN HFA 108 (90 Base) MCG/ACT inhaler INHALE TWO PUFFS INTO THE LUNGS EVERY 4 HOURS AS NEEDED FOR WHEEZING OR SHORTNESS OF  BREATH Patient taking differently: Inhale 2 puffs into the lungs every 4 (four) hours as needed for wheezing or shortness of breath. 06/22/20   Mar Daring, PA-C  verapamil (CALAN-SR) 120 MG CR tablet Take 1 tablet (120 mg total) by mouth at bedtime. 11/21/20   Mar Daring, PA-C  vitamin B-12 1000 MCG tablet Take 1 tablet (1,000 mcg total) by mouth daily. 08/16/20   Ezekiel Slocumb, DO    Allergies Nsaids; Amoxicillin; Ibandronic acid; Actonel  [risedronate]; Antihistamines, chlorpheniramine-type; Aspirin; Buspar [buspirone]; Butalbital-aspirin-caffeine; Clarithromycin; Codeine; Gatifloxacin; Hydrocodone-guaifenesin; Moxifloxacin; Oxycodone; Penicillins; Risedronate sodium; Tylenol with codeine #3  [acetaminophen-codeine]; and Raloxifene  Family History  Problem Relation Age of Onset  . Heart disease Mother   . CAD Mother   . Congestive Heart Failure Mother   . Osteoarthritis Mother   . Cancer Sister        breast  . Breast cancer Sister     Social History Social History   Tobacco Use  . Smoking status: Never Smoker  . Smokeless tobacco: Never Used  Vaping Use  . Vaping Use: Never used  Substance Use Topics  . Alcohol use: No  . Drug use: No    Review of Systems  Constitutional: No fever/chills Eyes: No visual changes. ENT: No sore throat. Cardiovascular: Denies chest pain. Respiratory: positive for shortness of breath and increased dyspnea on exertion Gastrointestinal: No abdominal pain.  No nausea, no vomiting.  No diarrhea.  No constipation. Genitourinary: Negative for dysuria. Musculoskeletal: Negative for back pain. Skin: Negative for rash. Neurological: Negative for headaches, focal weakness or numbness.  ____________________________________________   PHYSICAL EXAM:  VITAL SIGNS: Vitals:   03/19/21 1517 03/19/21 1615  BP: 118/78 119/78  Pulse: (!) 131 92  Resp: 18 (!) 25  Temp: 98 F (36.7 C)   SpO2: 97% 99%     Constitutional: Alert  and oriented. Well appearing and in no acute distress.  Sitting up in the side of the bed and conversational in full sentences.  On nasal cannula. Eyes: Conjunctivae are normal. PERRL. EOMI. Head: Atraumatic. Nose: No congestion/rhinnorhea. Mouth/Throat: Mucous membranes are moist.  Oropharynx non-erythematous. Neck: No stridor. No cervical spine tenderness to palpation. Cardiovascular: Tachycardic and irregular. Grossly normal heart sounds.  Good peripheral circulation. Respiratory: Mild tachypnea to the low 20s, no further evidence of distress.  If you circulatory wheezes with poor air movement throughout.  No focal features. Gastrointestinal: Soft , nondistended, nontender to palpation. No CVA tenderness. Musculoskeletal: No lower extremity tenderness nor edema.  No joint effusions. No signs of acute trauma. Neurologic:  Normal speech and language. No gross focal neurologic deficits are appreciated. No gait instability noted. Skin:  Skin is warm, dry and intact. No rash noted. Psychiatric: Mood and affect are normal. Speech and behavior are normal. ____________________________________________   LABS (all labs ordered are listed, but only abnormal results are displayed)  Labs Reviewed  CBC WITH DIFFERENTIAL/PLATELET - Abnormal; Notable for the following components:      Result Value   RBC 3.85 (*)    Hemoglobin 7.2 (*)    HCT 26.6 (*)    MCV 69.1 (*)    MCH 18.7 (*)    MCHC 27.1 (*)    RDW 18.4 (*)    All other components within normal limits  COMPREHENSIVE METABOLIC PANEL - Abnormal; Notable for the following components:   Potassium 5.3 (*)    Glucose, Bld 141 (*)    BUN 31 (*)    All other components within normal limits  SARS CORONAVIRUS 2 (TAT 6-24 HRS)  MAGNESIUM  TROPONIN I (HIGH SENSITIVITY)  TROPONIN I (HIGH SENSITIVITY)   ____________________________________________  12 Lead EKG  A. fib, rate of 121 bpm.  Normal axis and intervals.  No evidence of acute ischemia.   A. fib with RVR. ____________________________________________  RADIOLOGY  ED MD interpretation: 1 view CXR reviewed by me with no evidence of acute cardiopulmonary pathology  Official radiology report(s): DG Chest Portable 1 View  Result Date: 03/19/2021 CLINICAL DATA:  85 year old female with COPD exacerbation. EXAM: PORTABLE CHEST 1 VIEW COMPARISON:  Chest radiograph dated 08/24/2020. FINDINGS: There is eventration of the left hemidiaphragm with left lung base atelectasis/scarring. There is background of emphysema and chronic interstitial coarsening and bronchitic changes. No focal consolidation, pleural effusion, pneumothorax. Stable cardiomegaly. Atherosclerotic calcification of the aorta. No acute osseous pathology. IMPRESSION: 1. No acute cardiopulmonary process. 2. Emphysema and chronic interstitial coarsening. Electronically Signed   By: Anner Crete M.D.   On: 03/19/2021 16:23    ____________________________________________   PROCEDURES and INTERVENTIONS  Procedure(s) performed (including Critical Care):  .1-3 Lead EKG Interpretation Performed by: Vladimir Crofts, MD Authorized by: Vladimir Crofts, MD     Interpretation: abnormal     ECG rate:  121   ECG rate assessment: tachycardic     Rhythm: atrial fibrillation     Ectopy: none     Conduction: normal   .Critical Care Performed by: Vladimir Crofts, MD Authorized by: Vladimir Crofts, MD   Critical care provider statement:    Critical care time (minutes):  68   Critical care was necessary to treat or prevent imminent or life-threatening deterioration of the following conditions:  Circulatory failure and cardiac failure   Critical care was time spent personally by me on the following activities:  Discussions with consultants, evaluation of patient's response to treatment, examination of patient, ordering and performing treatments and interventions, ordering and review of laboratory studies, ordering and review of radiographic  studies, pulse oximetry, re-evaluation of patient's condition, obtaining history from patient or surrogate and review of old charts    Medications  diltiazem (CARDIZEM) 1 mg/mL load via infusion 20 mg (20 mg Intravenous Bolus from Bag 03/19/21 1554)    And  diltiazem (CARDIZEM) 125 mg in dextrose 5% 125 mL (1 mg/mL) infusion (5 mg/hr Intravenous New Bag/Given 03/19/21 1555)  methylPREDNISolone sodium succinate (SOLU-MEDROL) 125 mg/2 mL injection 125 mg (125 mg Intravenous Given 03/19/21 1553)  ipratropium-albuterol (DUONEB) 0.5-2.5 (3) MG/3ML nebulizer solution 6 mL (6 mLs Nebulization Given 03/19/21 1553)    ____________________________________________   MDM / ED COURSE   85 year old woman with history of A. fib and COPD, presents to the ED with evidence of COPD exacerbation precipitating A. fib with RVR and necessitating medical admission.  Patient tachycardic in A. fib with RVR, but remains hemodynamically stable and on her home O2 via nasal cannula.  Exam with evidence of COPD exacerbation with diffuse expiratory wheezes, tachypnea and shortness of breath.  She has no neurovascular deficits, distress or any signs of trauma.  Blood work with worsening of her chronic microcytic anemia, but does not cross a transfusion threshold.  Furthermore, she is stable and without GI bleeding symptomatology.  CXR demonstrates no infiltrates and she has no increased sputum production to suggest infectious etiology of a COPD exacerbation.  Breathing treatments and steroids provided, with improvement of her respiratory symptoms.  She remains tachycardic in A. fib with RVR after diltiazem bolus, and so drip was started and continued at the time of admission.  Due to her continued wheezing after breathing treatments and tachycardia requiring diltiazem drip, we will admit to hospitalist medicine for further work-up and management.  Clinical Course as of 03/19/21 1722  Mon Mar 19, 2021  1639 Reassessed.  Patient reports  improved respiratory symptoms.  She remains tachycardic and in the 110s on 5 mg/h of diltiazem.  We discussed admission, she is agreeable. [DS]  1715 I discuss the case with Dr. Marcello Moores, who agrees to admit [DS]    Clinical Course User Index [DS] Vladimir Crofts, MD    ____________________________________________   FINAL CLINICAL IMPRESSION(S) / ED DIAGNOSES  Final diagnoses:  Atrial fibrillation with RVR ([place])  COPD with acute exacerbation [facility])     ED Discharge Orders    None       Datra Clary   Note:  This document was prepared using Dragon voice recognition software and may include unintentional dictation errors.   Vladimir Crofts, MD 03/19/21 [phone]

## 2021-03-19 NOTE — Progress Notes (Signed)
[place] NOTE  Patient Care Team: Mar Daring, PA-C as PCP - General (Family Medicine) Domingo Pulse, MD as Consulting Physician (Urology) Solum, Betsey Holiday, MD as Physician Assistant (Endocrinology) Theresa Duty, MD (Radiology) Jules Husbands, MD as Consulting Physician (General Surgery) Flora Lipps, MD as Consulting Physician (Pulmonary Disease) Isaias Cowman, MD as Consulting Physician (Cardiology) Dingeldein, Remo Lipps, MD (Ophthalmology) Neldon Labella, RN as Case Manager  CHIEF COMPLAINTS/PURPOSE OF CONSULTATION: ANEMIA   HEMATOLOGY HISTORY:  #Severe iron deficient anemia [name] 2022- 7.3] EGD-; colonoscopy- cecal polyp [not resected]  #COPD on 2 L of oxygen; history of A. Fib [Dr. Paraschos]-not on Eliquis.  HISTORY OF PRESENTING ILLNESS:  Jackie Turner 85 y.o.  female with multiple medical problems including COPD on 2 L of oxygen ; A. fib not on Eliquis has been referred to [place] for further evaluation/work-up for anemia.  Patient is suspected to have history of GI bleed.  Patient had PRBC transfusion in the hospital in June/August 2021.  Patient had colonoscopy-in August 2021 that showed large polyp at the ileocecal valve and the lesion was not removed due to the size and location.  The biopsies came back showing it to be a tubular adenoma.   Complains of worsening fatigue.  Complains of shortness of breath on exertion.  Blood in stools: None Black stool- June 2021 Change in bowel habits- None Blood in urine: None Difficulty swallowing: None Abnormal weight loss: None Iron supplementation: none Prior Blood transfusions:Yes;  aug 2021 Bariatric surgery: None Vaginal bleeding: None  Review of Systems  Constitutional: Positive for malaise/fatigue and weight loss. Negative for chills, diaphoresis and fever.  HENT: Negative for nosebleeds and sore throat.   Eyes: Negative for double vision.  Respiratory: Positive for  shortness of breath. Negative for cough, hemoptysis, sputum production and wheezing.   Cardiovascular: Negative for chest pain, palpitations, orthopnea and leg swelling.  Gastrointestinal: Positive for melena. Negative for abdominal pain, blood in stool, constipation, diarrhea, heartburn, nausea and vomiting.  Genitourinary: Negative for dysuria, frequency and urgency.  Musculoskeletal: Positive for back pain and joint pain.  Skin: Negative.  Negative for itching and rash.  Neurological: Positive for dizziness. Negative for tingling, focal weakness, weakness and headaches.  Endo/Heme/Allergies: Does not bruise/bleed easily.  Psychiatric/Behavioral: Negative for depression. The patient is not nervous/anxious and does not have insomnia.     MEDICAL HISTORY:  Past Medical History:  Diagnosis Date  . Allergy   . Asthma   . Hyperlipidemia   . Osteoporosis     SURGICAL HISTORY: Past Surgical History:  Procedure Laterality Date  . ABDOMINAL HYSTERECTOMY  2003  . BREAST BIOPSY  1976, 1978   benign  . carpal tunnel repair Bilateral    R 1990; L 1997  . COLONOSCOPY WITH PROPOFOL N/A 08/14/2020   Procedure: COLONOSCOPY WITH PROPOFOL;  Surgeon: Lucilla Lame, MD;  Location: [facility] ENDOSCOPY;  Service: Endoscopy;  Laterality: N/A;  . ESOPHAGOGASTRODUODENOSCOPY N/A 08/12/2020   Procedure: ESOPHAGOGASTRODUODENOSCOPY (EGD);  Surgeon: Lin Landsman, MD;  Location: [facility] ENDOSCOPY;  Service: Gastroenterology;  Laterality: N/A;  . EYE SURGERY Bilateral    cataract extraction  . HAND TENDON SURGERY Right    Trigger finger  . IR RADIOLOGIST EVAL & MGMT  06/13/2017  . IR VERTEBROPLASTY CERV/THOR BX INC UNI/BIL INC/INJECT/IMAGING  06/17/2017  . TONSILLECTOMY  1961    SOCIAL HISTORY: Social History   Socioeconomic History  . Marital status: Widowed    Spouse name: [name]  .  Number of children: 0  . Years of education: H/S  . Highest education level: 12th grade  Occupational History  .  Occupation: Retired  Tobacco Use  . Smoking status: Never Smoker  . Smokeless tobacco: Never Used  Vaping Use  . Vaping Use: Never used  Substance and Sexual Activity  . Alcohol use: No  . Drug use: No  . Sexual activity: Never  Other Topics Concern  . Not on file  Social History Narrative  . Not on file   Social Determinants of Health   Financial Resource Strain: Low Risk   . Difficulty of Paying Living Expenses: Not hard at all  Food Insecurity: No Food Insecurity  . Worried About Charity fundraiser in the Last Year: Never true  . Ran Out of Food in the Last Year: Never true  Transportation Needs: No Transportation Needs  . Lack of Transportation (Medical): No  . Lack of Transportation (Non-Medical): No  Physical Activity: Inactive  . Days of Exercise per Week: 0 days  . Minutes of Exercise per Session: 0 min  Stress: No Stress Concern Present  . Feeling of Stress : Not at all  Social Connections: Socially Isolated  . Frequency of Communication with Friends and Family: More than three times a week  . Frequency of Social Gatherings with Friends and Family: More than three times a week  . Attends Religious Services: Never  . Active Member of Clubs or Organizations: No  . Attends Archivist Meetings: Never  . Marital Status: Widowed  Intimate Partner Violence: Not At Risk  . Fear of Current or Ex-Partner: No  . Emotionally Abused: No  . Physically Abused: No  . Sexually Abused: No    FAMILY HISTORY: Family History  Problem Relation Age of Onset  . Heart disease Mother   . CAD Mother   . Congestive Heart Failure Mother   . Osteoarthritis Mother   . Cancer Sister        breast  . Breast cancer Sister     ALLERGIES:  is allergic to nsaids; amoxicillin; ibandronic acid; actonel  [risedronate]; antihistamines, chlorpheniramine-type; aspirin; buspar [buspirone]; butalbital-aspirin-caffeine; clarithromycin; codeine; gatifloxacin; hydrocodone-guaifenesin;  moxifloxacin; oxycodone; penicillins; risedronate sodium; tylenol with codeine #3  [acetaminophen-codeine]; and raloxifene.  MEDICATIONS:  Current Outpatient Medications  Medication Sig Dispense Refill  . calcium carbonate (OSCAL) 1500 (600 Ca) MG TABS tablet Take 1 tablet by mouth 2 (two) times daily with a meal.   0  . dicyclomine (BENTYL) 10 MG capsule Take 1 capsule (10 mg total) by mouth 3 (three) times daily as needed for spasms. 90 capsule 1  . doxepin (SINEQUAN) 10 MG capsule TAKE 3 CAPSULES BY MOUTH AT BEDTIME 270 capsule 0  . feeding supplement, ENSURE ENLIVE, (ENSURE ENLIVE) LIQD Take 237 mLs by mouth 2 (two) times daily between meals. 237 mL 12  . fluticasone (FLONASE) 50 MCG/ACT nasal spray USE 2 SPRAYS IN EACH NOSTRIL ONCE A DAY. 16 g 1  . Fluticasone-Umeclidin-Vilant (TRELEGY ELLIPTA) 100-62.5-25 MCG/INH AEPB Inhale 1 puff into the lungs daily. 60 each 11  . furosemide (LASIX) 40 MG tablet Take 1 tablet (40 mg total) by mouth daily as needed for edema. 90 tablet 1  . nystatin (MYCOSTATIN) 100000 UNIT/ML suspension TAKE 5 MLS BY MOUTH 4 TIMES A DAY. 60 mL 5  . oxybutynin (DITROPAN XL) 15 MG 24 hr tablet Take 1 tablet (15 mg total) by mouth at bedtime. 90 tablet 1  . pentosan polysulfate (ELMIRON) 100  MG capsule Take 1 capsule (100 mg total) by mouth 2 (two) times daily as needed (urinary pain). 180 capsule 1  . traMADol (ULTRAM) 50 MG tablet TAKE 1 TABLET BY MOUTH EVERY 8 HOURS AS NEEDED FOR PAIN 90 tablet 5  . Urea 40 % LOTN Apply 1 application topically daily as needed (dry skin).   0  . VENTOLIN HFA 108 (90 Base) MCG/ACT inhaler INHALE TWO PUFFS INTO THE LUNGS EVERY 4 HOURS AS NEEDED FOR WHEEZING OR SHORTNESS OF BREATH (Patient taking differently: Inhale 2 puffs into the lungs every 4 (four) hours as needed for wheezing or shortness of breath.) 18 g 5  . verapamil (CALAN-SR) 120 MG CR tablet Take 1 tablet (120 mg total) by mouth at bedtime. 90 tablet 1  . vitamin B-12 1000 MCG  tablet Take 1 tablet (1,000 mcg total) by mouth daily. 30 tablet 1  . albuterol (PROVENTIL) (2.5 MG/3ML) 0.083% nebulizer solution Take 3 mLs (2.5 mg total) by nebulization every 6 (six) hours as needed for wheezing or shortness of breath. (Patient not taking: No sig reported) 75 mL 12  . ferrous gluconate (FERGON) 324 MG tablet Take 1 tablet (324 mg total) by mouth daily with breakfast. (Patient not taking: Reported on 03/19/2021) 90 tablet 0   No current facility-administered medications for this visit.      PHYSICAL EXAMINATION:   Vitals:   03/19/21 1400  BP: 128/75  Pulse: (!) 129  Resp: 16  Temp: 98.5 F (36.9 C)  SpO2: 99%   Filed Weights   03/19/21 1400  Weight: 117 lb (53.1 kg)    Physical Exam Constitutional:      Comments: Frail-appearing Caucasian female patient.  She is accompanied by her friend.  She is ambulating herself.  HENT:     Head: Normocephalic and atraumatic.     Mouth/Throat:     Pharynx: No oropharyngeal exudate.  Eyes:     Pupils: Pupils are equal, round, and reactive to light.  Cardiovascular:     Rate and Rhythm: Tachycardia present. Rhythm irregular.  Pulmonary:     Effort: No respiratory distress.     Breath sounds: No wheezing.     Comments: Decreased air entry bilaterally.  No wheeze. Abdominal:     General: Bowel sounds are normal. There is no distension.     Palpations: Abdomen is soft. There is no mass.     Tenderness: There is no abdominal tenderness. There is no guarding or rebound.  Musculoskeletal:        General: No tenderness. Normal range of motion.     Cervical back: Normal range of motion and neck supple.  Skin:    General: Skin is warm.  Neurological:     Mental Status: She is alert and oriented to person, place, and time.  Psychiatric:        Mood and Affect: Affect normal.     LABORATORY DATA:  I have reviewed the data as listed Lab Results  Component Value Date   WBC 6.3 03/01/2021   HGB 7.4 (L) 03/01/2021    HCT 26.5 (L) 03/01/2021   MCV 68 (L) 03/01/2021   PLT 339 03/01/2021   Recent Labs    08/11/20 0626 08/12/20 0613 08/15/20 0417 08/24/20 1139 09/04/20 1606 03/01/21 1627  NA 149*   < > 141 141 141 143  K 3.5   < > 4.0 4.5 3.8 5.0  CL 95*   < > 98 93* 92* 101  CO2 37*   < >  34* 34* 38* 31*  GLUCOSE 121*   < > 107* 105* 106* 89  BUN 33*   < > 16 20 23 23   CREATININE 0.84   < > 0.70 0.95 0.98 0.69  CALCIUM 9.2   < > 8.7* 9.6 10.1 9.7  GFRNONAA >60   < > >60 56* 54*  --   GFRAA >60   < > >60 65 >60  --   PROT 6.2*  --   --   --  7.4 6.5  ALBUMIN 3.6  --   --   --  4.3 4.1  AST 23  --   --   --  21 19  ALT 14  --   --   --  16 16  ALKPHOS 58  --   --   --  69 77  BILITOT 0.9  --   --   --  0.6 <0.2   < > = values in this interval not displayed.     No results found.  Microcytic anemia #Severe iron deficient anemia-March 2022-hemoglobin 7.3; etiology-question of GI bleed/melena [see below].  Patient is quite symptomatic.  Patient will likely need urgent PRBC transfusion.   # ETIOLOGY-EGD-no obvious bleeding/colonoscopy cecal polyp- adenoma [AUG 2021]- not resected given location.  However given black loose stool/symptomatic anemia-patient might need repeat GI evaluation.  # A.fib not on Eliquis- [Dr.Parschoes]-heart rate 130s RVR.  Given the compromised respiratory status-patient is quite symptomatic.   #Given the multitude of above issues-patient will need urgent evaluation/treatment at the emergency room.  Recommend evaluation in the emergency room.  Thank you Ms. Burnett [facility] for allowing me to participate in the care of your pleasant patient. Please do not hesitate to contact me with questions or concerns in the interim.  # DISPOSITION: # ER   All questions were answered. The patient knows to call the clinic with any problems, questions or concerns.    Cammie Sickle, MD 03/19/2021 3:20 PM

## 2021-03-19 NOTE — Assessment & Plan Note (Addendum)
#  Severe iron deficient anemia-March 2022-hemoglobin 7.3; etiology-question of GI bleed/melena [see below].  Patient is quite symptomatic.  Patient will likely need urgent PRBC transfusion/ IV venofer.   # ETIOLOGY-EGD-no obvious bleeding/colonoscopy cecal polyp- adenoma [AUG 2021]- not resected given location.  However given black loose stool/symptomatic anemia-patient might need repeat GI evaluation.  # A.fib not on Eliquis- [Dr.Parschoes]-heart rate 130s RVR.  Given the compromised respiratory status-patient is quite symptomatic.   #Given the multitude of above issues-patient will need urgent evaluation/treatment at the emergency room.  Recommend evaluation in the emergency room.  Thank you Ms. Burnett [facility] for allowing me to participate in the care of your pleasant patient. Please do not hesitate to contact me with questions or concerns in the interim.  # DISPOSITION: # ER

## 2021-03-19 NOTE — H&P (Signed)
History and Physical    Jackie Turner QHU:[id number] DOB: November 25, 1936 DOA: 03/19/2021  PCP: Mar Daring, PA-C  Patient coming from: oncology office  I have personally briefly reviewed patient's old medical records in [place]  Chief Complaint: sob/afib rvr  HPI: Jackie Turner is a 85 y.o. female with medical history significant of COPD on 2 L of oxygen ; A. fib not on anticoag due to severe IDA with concern for gi bleed, she also has history of large polyp at the ielocecal valve noted on colonoscope which was not removed due to size and location,bx however noting path suggestive of tubular adenoma. She presents from oncology clinic due to increase sob as well as afib with rvr with labs hgb of 7.2 stable from prior of 7.4 on 02/26/21 but decreased form 10.8 9/21.  Patient of note also recently had follow up with pcp and was noted to have increased hr. It was noted that patient was not taking the correct dose of her prescribed cardizem, patient was at that time educated regarding dose.  Patient currently states she is note taking cardizem as prescribed. She note that over the last 3-4 days she has had increase fatigue as well as DOE but she did not think very much of it. She states she had a scheduled appointment today and was found to have elevated heart rate and  continued low hgb. She notes that she always has dark stools and has not noted any difference in her her stools of late. She notes low appetite ,fatigue but no chest pain, she also noted that she does not feel her heart racing. She denies fever/chills/abdominal pain, cough , wheezing or dysuria.   ED Course:  Vitals: afeb, bp 118/79   Hr 129    Sat99% on chronic 2L ekg afib with rvr 121 ce 8 Mag 1.9 Na 140, K5.3, glu 141, bun31 cr 0.66 Wbc:9.2, hgb:7.2 stable from 7.4  ,two weeks ago  Pt:89 cxr: IMPRESSION: 1. No acute cardiopulmonary process. 2. Emphysema and chronic interstitial coarsening. Tx:  soluemdrol,duoneb, dilt bolus, dilt infusion  Respiratory panel pendning   Review of Systems: As per HPI otherwise 10 point review of systems negative.   Past Medical History:  Diagnosis Date  . Allergy   . Asthma   . Hyperlipidemia   . Osteoporosis     Past Surgical History:  Procedure Laterality Date  . ABDOMINAL HYSTERECTOMY  2003  . BREAST BIOPSY  1976, 1978   benign  . carpal tunnel repair Bilateral    R 1990; L 1997  . COLONOSCOPY WITH PROPOFOL N/A 08/14/2020   Procedure: COLONOSCOPY WITH PROPOFOL;  Surgeon: Lucilla Lame, MD;  Location: [facility];  Service: Endoscopy;  Laterality: N/A;  . ESOPHAGOGASTRODUODENOSCOPY N/A 08/12/2020   Procedure: ESOPHAGOGASTRODUODENOSCOPY (EGD);  Surgeon: Lin Landsman, MD;  Location: [facility] ENDOSCOPY;  Service: Gastroenterology;  Laterality: N/A;  . EYE SURGERY Bilateral    cataract extraction  . HAND TENDON SURGERY Right    Trigger finger  . IR RADIOLOGIST EVAL & MGMT  06/13/2017  . IR VERTEBROPLASTY CERV/THOR BX INC UNI/BIL INC/INJECT/IMAGING  06/17/2017  . TONSILLECTOMY  1961     reports that she has never smoked. She has never used smokeless tobacco. She reports that she does not drink alcohol and does not use drugs.  Allergies  Allergen Reactions  . Nsaids Other (See Comments)    History of gastric ulcer  . Amoxicillin Nausea Only and Rash  . Ibandronic Acid Other (  See Comments)    Muscle weakness - advised not to take  **BONIVA** - drug name  . Actonel  [Risedronate] Other (See Comments)    Gastric ulcers  . Antihistamines, Chlorpheniramine-Type   . Aspirin     Gastric ulcer  . Buspar [Buspirone] Other (See Comments)    Pt does not remember reaction   . Butalbital-Aspirin-Caffeine Other (See Comments)    Other reaction(s): Unknown  . Clarithromycin Other (See Comments)    Bloating  *BIAXIN*  . Codeine   . Gatifloxacin Other (See Comments)  . Hydrocodone-Guaifenesin Nausea Only    CODICLEAR DH STYRUP   .  Moxifloxacin   . Oxycodone Other (See Comments)    Dizziness  . Penicillins Other (See Comments)  . Risedronate Sodium     Gastric ulcer  . Tylenol With Codeine #3  [Acetaminophen-Codeine]     Other reaction(s): Unknown  . Raloxifene Other (See Comments)    Bloating Bloating  *EVISTA*    Family History  Problem Relation Age of Onset  . Heart disease Mother   . CAD Mother   . Congestive Heart Failure Mother   . Osteoarthritis Mother   . Cancer Sister        breast  . Breast cancer Sister    Prior to Admission medications   Medication Sig Start Date End Date Taking? Authorizing Provider  albuterol (PROVENTIL) (2.5 MG/3ML) 0.083% nebulizer solution Take 3 mLs (2.5 mg total) by nebulization every 6 (six) hours as needed for wheezing or shortness of breath. Patient not taking: No sig reported 09/19/20   Flora Lipps, MD  calcium carbonate (OSCAL) 1500 (600 Ca) MG TABS tablet Take 1 tablet by mouth 2 (two) times daily with a meal.  06/22/20   Burnette, Clearnce Sorrel, PA-C  dicyclomine (BENTYL) 10 MG capsule Take 1 capsule (10 mg total) by mouth 3 (three) times daily as needed for spasms. 06/22/20   Mar Daring, PA-C  doxepin (SINEQUAN) 10 MG capsule TAKE 3 CAPSULES BY MOUTH AT BEDTIME 01/03/21   Mar Daring, PA-C  feeding supplement, ENSURE ENLIVE, (ENSURE ENLIVE) LIQD Take 237 mLs by mouth 2 (two) times daily between meals. 06/16/20   Hongalgi, Lenis Dickinson, MD  ferrous gluconate (FERGON) 324 MG tablet Take 1 tablet (324 mg total) by mouth daily with breakfast. Patient not taking: Reported on 03/19/2021 08/25/20   Mar Daring, PA-C  fluticasone [facility]) 50 MCG/ACT nasal spray USE 2 SPRAYS IN EACH NOSTRIL ONCE A DAY. 01/15/21   Mar Daring, PA-C  Fluticasone-Umeclidin-Vilant (TRELEGY ELLIPTA) 100-62.5-25 MCG/INH AEPB Inhale 1 puff into the lungs daily. 06/22/20   Mar Daring, PA-C  furosemide (LASIX) 40 MG tablet Take 1 tablet (40 mg total) by mouth daily as  needed for edema. 10/30/20   Mar Daring, PA-C  nystatin (MYCOSTATIN) 100000 UNIT/ML suspension TAKE 5 MLS BY MOUTH 4 TIMES A DAY. 10/30/20   Mar Daring, PA-C  oxybutynin (DITROPAN XL) 15 MG 24 hr tablet Take 1 tablet (15 mg total) by mouth at bedtime. 06/22/20   Mar Daring, PA-C  pentosan polysulfate (ELMIRON) 100 MG capsule Take 1 capsule (100 mg total) by mouth 2 (two) times daily as needed (urinary pain). 10/30/20   Mar Daring, PA-C  traMADol (ULTRAM) 50 MG tablet TAKE 1 TABLET BY MOUTH EVERY 8 HOURS AS NEEDED FOR PAIN 10/06/20   Mar Daring, PA-C  Urea 40 % LOTN Apply 1 application topically daily as needed (dry skin).  06/22/20  Fenton Malling M, PA-C  VENTOLIN HFA 108 (90 Base) MCG/ACT inhaler INHALE TWO PUFFS INTO THE LUNGS EVERY 4 HOURS AS NEEDED FOR WHEEZING OR SHORTNESS OF BREATH Patient taking differently: Inhale 2 puffs into the lungs every 4 (four) hours as needed for wheezing or shortness of breath. 06/22/20   Mar Daring, PA-C  verapamil (CALAN-SR) 120 MG CR tablet Take 1 tablet (120 mg total) by mouth at bedtime. 11/21/20   Mar Daring, PA-C  vitamin B-12 1000 MCG tablet Take 1 tablet (1,000 mcg total) by mouth daily. 08/16/20   Ezekiel Slocumb, DO    Physical Exam: Vitals:   03/19/21 1615 03/19/21 1700 03/19/21 1745 03/19/21 1800  BP: 119/78 132/68 109/75 126/77  Pulse: 92 (!) 105 100 (!) 104  Resp: (!) 25 (!) 26 (!) 22 18  Temp:      SpO2: 99% 96% 96% 97%     Vitals:   03/19/21 1615 03/19/21 1700 03/19/21 1745 03/19/21 1800  BP: 119/78 132/68 109/75 126/77  Pulse: 92 (!) 105 100 (!) 104  Resp: (!) 25 (!) 26 (!) 22 18  Temp:      SpO2: 99% 96% 96% 97%  Constitutional: NAD, calm, comfortable Eyes: PERRL, lids and conjunctivae pale ENMT: Mucous membranes are dry. Posterior pharynx clear of any exudate or lesions.Normal dentition.  Neck: normal, supple, no masses, no thyromegaly Respiratory: clear to  auscultation bilaterally, no wheezing, no crackles. Normal respiratory effort. No accessory muscle use.  Cardiovascular: irregular tachycardic ,no murmurs / rubs / gallops. No extremity edema. 2+ pedal pulses.  Abdomen: no tenderness, no masses palpated. No hepatosplenomegaly. Bowel sounds positive.  Musculoskeletal: no clubbing / cyanosis. No joint deformity upper and lower extremities. Good ROM, no contractures. Normal muscle tone.  Skin: no rashes, lesions, ulcers. No induration Neurologic: CN 2-12 grossly intact. Sensation intact, Strength 5/5 in all 4.  Psychiatric: Normal judgment and insight. Alert and oriented x 3. Normal mood.    Labs on Admission: I have personally reviewed following labs and imaging studies  CBC: Recent Labs  Lab 03/19/21 1545  WBC 8.2  NEUTROABS 6.1  HGB 7.2*  HCT 26.6*  MCV 69.1*  PLT 485   Basic Metabolic Panel: Recent Labs  Lab 03/19/21 1545  NA 140  K 5.3*  CL 100  CO2 32  GLUCOSE 141*  BUN 31*  CREATININE 0.66  CALCIUM 9.4  MG 1.9   GFR: Estimated Creatinine Clearance: 39.7 mL/min (by C-G formula based on SCr of 0.66 mg/dL). Liver Function Tests: Recent Labs  Lab 03/19/21 1545  AST 32  ALT 16  ALKPHOS 60  BILITOT 0.5  PROT 7.0  ALBUMIN 3.9   No results for input(s): LIPASE, AMYLASE in the last 168 hours. No results for input(s): AMMONIA in the last 168 hours. Coagulation Profile: No results for input(s): INR, PROTIME in the last 168 hours. Cardiac Enzymes: No results for input(s): CKTOTAL, CKMB, CKMBINDEX, TROPONINI in the last 168 hours. BNP (last 3 results) No results for input(s): PROBNP in the last 8760 hours. HbA1C: No results for input(s): HGBA1C in the last 72 hours. CBG: No results for input(s): GLUCAP in the last 168 hours. Lipid Profile: No results for input(s): CHOL, HDL, LDLCALC, TRIG, CHOLHDL, LDLDIRECT in the last 72 hours. Thyroid Function Tests: No results for input(s): TSH, T4TOTAL, FREET4, T3FREE,  THYROIDAB in the last 72 hours. Anemia Panel: No results for input(s): VITAMINB12, FOLATE, FERRITIN, TIBC, IRON, RETICCTPCT in the last 72 hours. Urine analysis:  Component Value Date/Time   COLORURINE YELLOW (A) 04/16/2017 1533   APPEARANCEUR HAZY (A) 04/16/2017 1533   LABSPEC 1.026 04/16/2017 1533   PHURINE 5.0 04/16/2017 1533   GLUCOSEU NEGATIVE 04/16/2017 1533   HGBUR NEGATIVE 04/16/2017 1533   BILIRUBINUR negative 12/31/2017 1646   KETONESUR NEGATIVE 04/16/2017 1533   PROTEINUR negative 12/31/2017 1646   PROTEINUR NEGATIVE 04/16/2017 1533   UROBILINOGEN 0.2 12/31/2017 1646   NITRITE negative 12/31/2017 1646   NITRITE NEGATIVE 04/16/2017 1533   LEUKOCYTESUR Negative 12/31/2017 1646    Radiological Exams on Admission: DG Chest Portable 1 View  Result Date: 03/19/2021 CLINICAL DATA:  85 year old female with COPD exacerbation. EXAM: PORTABLE CHEST 1 VIEW COMPARISON:  Chest radiograph dated 08/24/2020. FINDINGS: There is eventration of the left hemidiaphragm with left lung base atelectasis/scarring. There is background of emphysema and chronic interstitial coarsening and bronchitic changes. No focal consolidation, pleural effusion, pneumothorax. Stable cardiomegaly. Atherosclerotic calcification of the aorta. No acute osseous pathology. IMPRESSION: 1. No acute cardiopulmonary process. 2. Emphysema and chronic interstitial coarsening. Electronically Signed   By: Anner Crete M.D.   On: 03/19/2021 16:23    EKG: Independently reviewed. See above  Assessment/Plan  Afib with rvr -? Triggered by partial compliance, progressive anemia, copd exacerbation -on dilt drip with improving hr, wean as able  -resume oral medication to assist with weaning drip -not on anticoag due to severe ida and concern for gi source    IDA  -hbg stable over the last 2 weeks  -check fob , iron panel  - transfuse one unit prbc due to current symptoms  -monitor h/h   Mild acute COPD -in ed patient  initially with wheezing, patient was given solumedrol/duonebs -no current wheezing at this time  -prednisone po 40 x 5 days  -nebs standing and prn    Hx of Gi bleed  -? Episode prior to 2 weeks ago  -patient denies  -? Occult loss, does have hx of polyp  -ppi -if fob + , and patient not responding to transfusion consider further gi evaluation in am   Mild hyperkalemia -repeat labs and monitor  -treat is trending upward   DVT prophylaxis:scd Code Status: Full Family Communication: n/a Disposition Plan: patient  expected to be admitted greater than 2 midnights Consults called: n/a Admission status: inpatient  Clance Boll MD [facility]  If 7PM-7AM, please contact night-coverage [email or web address] Password [facility]  03/19/2021, 6:21 PM

## 2021-03-20 ENCOUNTER — Encounter: Payer: Self-pay | Admitting: Internal Medicine

## 2021-03-20 DIAGNOSIS — I4891 Unspecified atrial fibrillation: Secondary | ICD-10-CM | POA: Diagnosis present

## 2021-03-20 LAB — TYPE AND SCREEN
ABO/RH(D): A POS
Antibody Screen: NEGATIVE
Unit division: 0

## 2021-03-20 LAB — URINALYSIS, ROUTINE W REFLEX MICROSCOPIC
Bacteria, UA: NONE SEEN
Bilirubin Urine: NEGATIVE
Glucose, UA: NEGATIVE mg/dL
Hgb urine dipstick: NEGATIVE
Ketones, ur: NEGATIVE mg/dL
Nitrite: NEGATIVE
Protein, ur: NEGATIVE mg/dL
Specific Gravity, Urine: 1.019 (ref 1.005–1.030)
pH: 5 (ref 5.0–8.0)

## 2021-03-20 LAB — COMPREHENSIVE METABOLIC PANEL
ALT: 18 U/L (ref 0–44)
AST: 41 U/L (ref 15–41)
Albumin: 3.8 g/dL (ref 3.5–5.0)
Alkaline Phosphatase: 53 U/L (ref 38–126)
Anion gap: 10 (ref 5–15)
BUN: 31 mg/dL — ABNORMAL HIGH (ref 8–23)
CO2: 29 mmol/L (ref 22–32)
Calcium: 9.2 mg/dL (ref 8.9–10.3)
Chloride: 98 mmol/L (ref 98–111)
Creatinine, Ser: 0.8 mg/dL (ref 0.44–1.00)
GFR, Estimated: >60 mL/min (ref >60)
Glucose, Bld: 195 mg/dL — ABNORMAL HIGH (ref 70–99)
Potassium: 4.8 mmol/L (ref 3.5–5.1)
Sodium: 137 mmol/L (ref 135–145)
Total Bilirubin: 0.8 mg/dL (ref 0.3–1.2)
Total Protein: 6.8 g/dL (ref 6.5–8.1)

## 2021-03-20 LAB — BPAM RBC
Blood Product Expiration Date: 202204282359
ISSUE DATE / TIME: 202204042245
Unit Type and Rh: 6200

## 2021-03-20 LAB — TROPONIN I (HIGH SENSITIVITY): Troponin I (High Sensitivity): 7 ng/L (ref <18)

## 2021-03-20 LAB — CBC
HCT: 30.7 % — ABNORMAL LOW (ref 36.0–46.0)
Hemoglobin: 8.9 g/dL — ABNORMAL LOW (ref 12.0–15.0)
MCH: 20.8 pg — ABNORMAL LOW (ref 26.0–34.0)
MCHC: 29 g/dL — ABNORMAL LOW (ref 30.0–36.0)
MCV: 71.9 fL — ABNORMAL LOW (ref 80.0–100.0)
Platelets: 314 10*3/uL (ref 150–400)
RBC: 4.27 MIL/uL (ref 3.87–5.11)
RDW: 21.8 % — ABNORMAL HIGH (ref 11.5–15.5)
WBC: 6.6 10*3/uL (ref 4.0–10.5)
nRBC: 0 % (ref 0.0–0.2)

## 2021-03-20 LAB — HEMOGLOBIN
Hemoglobin: 9.4 g/dL — ABNORMAL LOW (ref 12.0–15.0)
Hemoglobin: 9.3 g/dL — ABNORMAL LOW (ref 12.0–15.0)

## 2021-03-20 MED ORDER — FLUTICASONE FUROATE-VILANTEROL 100-25 MCG/INH IN AEPB
1.0000 | INHALATION_SPRAY | Freq: Every day | RESPIRATORY_TRACT | Status: DC
  Administered 2021-03-21: 1 via RESPIRATORY_TRACT
  Filled 2021-03-20: qty 28

## 2021-03-20 MED ORDER — FLUTICASONE PROPIONATE 50 MCG/ACT NA SUSP
2.0000 | Freq: Every day | NASAL | Status: DC
  Administered 2021-03-20 – 2021-03-21 (×2): 2 via NASAL
  Filled 2021-03-20: qty 16

## 2021-03-20 MED ORDER — SODIUM CHLORIDE 0.9 % IV BOLUS: 500.0000 mL | Freq: Once | INTRAVENOUS | Status: DC

## 2021-03-20 MED ORDER — TRAMADOL HCL 50 MG PO TABS
50.0000 mg | ORAL_TABLET | Freq: Three times a day (TID) | ORAL | Status: DC | PRN
  Administered 2021-03-20 – 2021-03-21 (×2): 50 mg via ORAL
  Filled 2021-03-20 (×2): qty 1

## 2021-03-20 MED ORDER — OXYBUTYNIN CHLORIDE ER 5 MG PO TB24
15.0000 mg | ORAL_TABLET | Freq: Every day | ORAL | Status: DC
  Administered 2021-03-20: 15 mg via ORAL
  Filled 2021-03-20 (×2): qty 1

## 2021-03-20 MED ORDER — FERROUS GLUCONATE 324 (38 FE) MG PO TABS
324.0000 mg | ORAL_TABLET | Freq: Every day | ORAL | Status: DC
  Administered 2021-03-21: 324 mg via ORAL
  Filled 2021-03-20: qty 1

## 2021-03-20 MED ORDER — METOPROLOL TARTRATE 25 MG PO TABS
25.0000 mg | ORAL_TABLET | Freq: Two times a day (BID) | ORAL | Status: DC
  Administered 2021-03-20 – 2021-03-21 (×2): 25 mg via ORAL
  Filled 2021-03-20 (×2): qty 1

## 2021-03-20 MED ORDER — IPRATROPIUM-ALBUTEROL 0.5-2.5 (3) MG/3ML IN SOLN
3.0000 mL | Freq: Three times a day (TID) | RESPIRATORY_TRACT | Status: DC
  Administered 2021-03-21 (×2): 3 mL via RESPIRATORY_TRACT
  Filled 2021-03-20: qty 3

## 2021-03-20 MED ORDER — DICYCLOMINE HCL 10 MG PO CAPS
10.0000 mg | ORAL_CAPSULE | Freq: Three times a day (TID) | ORAL | Status: DC | PRN
  Administered 2021-03-20: 10 mg via ORAL
  Filled 2021-03-20 (×2): qty 1

## 2021-03-20 MED ORDER — CALCIUM CARBONATE 1250 (500 CA) MG PO TABS
1.0000 | ORAL_TABLET | Freq: Two times a day (BID) | ORAL | Status: DC
  Administered 2021-03-20 – 2021-03-21 (×2): 500 mg via ORAL
  Filled 2021-03-20 (×3): qty 1

## 2021-03-20 MED ORDER — ALPRAZOLAM 0.25 MG PO TABS
0.2500 mg | ORAL_TABLET | Freq: Three times a day (TID) | ORAL | Status: DC | PRN
  Administered 2021-03-20 – 2021-03-21 (×3): 0.25 mg via ORAL
  Filled 2021-03-20 (×3): qty 1

## 2021-03-20 MED ORDER — FLUTICASONE-UMECLIDIN-VILANT 100-62.5-25 MCG/INH IN AEPB: 1.0000 | INHALATION_SPRAY | Freq: Every day | RESPIRATORY_TRACT | Status: DC

## 2021-03-20 MED ORDER — UMECLIDINIUM BROMIDE 62.5 MCG/INH IN AEPB
1.0000 | INHALATION_SPRAY | Freq: Every day | RESPIRATORY_TRACT | Status: DC
  Administered 2021-03-20 – 2021-03-21 (×2): 1 via RESPIRATORY_TRACT
  Filled 2021-03-20: qty 7

## 2021-03-20 MED ORDER — DOXEPIN HCL 10 MG PO CAPS
30.0000 mg | ORAL_CAPSULE | Freq: Every day | ORAL | Status: DC
  Administered 2021-03-20: 30 mg via ORAL
  Filled 2021-03-20 (×2): qty 3

## 2021-03-20 MED ORDER — VITAMIN B-12 1000 MCG PO TABS
1000.0000 ug | ORAL_TABLET | Freq: Every day | ORAL | Status: DC
  Administered 2021-03-20 – 2021-03-21 (×2): 1000 ug via ORAL
  Filled 2021-03-20 (×2): qty 1

## 2021-03-20 MED ORDER — ALBUTEROL SULFATE HFA 108 (90 BASE) MCG/ACT IN AERS
2.0000 | INHALATION_SPRAY | RESPIRATORY_TRACT | Status: DC | PRN
  Filled 2021-03-20: qty 6.7

## 2021-03-20 MED ORDER — PENTOSAN POLYSULFATE SODIUM 100 MG PO CAPS
100.0000 mg | ORAL_CAPSULE | Freq: Two times a day (BID) | ORAL | Status: DC | PRN
  Administered 2021-03-20: 100 mg via ORAL
  Filled 2021-03-20 (×2): qty 1

## 2021-03-20 NOTE — ED Notes (Signed)
Pt calling out using call light. This tech went in the rm to check on pt. Pt stated, "I feel really wet." This tech checked pt and pt was dry. Pure wick in place. I informed pt that she could urinate because the pure wick would catch the urine. Pt stated, "this is a new contraption and I never used it before. It feels really weird." This tech explained to pt what the pure wick was for. I checked the placement of the pure wick to see if it was misplaced and it appeared to be in the correct place. Call light within reach, lights dimmed, and the door was left open per pt request.

## 2021-03-20 NOTE — ED Notes (Signed)
Pt had been provided a purewick to void while unit of blood and cardizem drip were infusing -- pt however had not been able to void-- offered bedpan in lieu of purewick as pt states she was trying to void with purewick but unable to and reporting abdominal discomfort as she has not voided throughout the night.  Pt subsequently provided bedpan and was still having difficulty voiding- this nurse then verbalized the need to bladder scan with possibility of indwelling urinary cath for urinary retention however pt was able to then void.  Urine sample collected and sent to lab

## 2021-03-20 NOTE — Progress Notes (Addendum)
Progress Note    Jackie Turner  IWP:[id number] DOB: 07-08-1936  DOA: 03/19/2021 PCP: Mar Daring, PA-C      Brief Narrative:    Medical records reviewed and are as summarized below:  Jackie Turner is a 85 y.o. female with medical history significant for COPD, bronchiectasis, pulmonary hypertension, chronic hypoxic respiratory failure on 2 L/min oxygen at home, atrial fibrillation (taken off of Eliquis because of GI bleed), iron deficiency anemia, nonresectable ileocecal valve tubular adenoma.  From the oncology clinic to the emergency room because of tachycardia (A. fib), low hemoglobin of 7.2, shortness of breath, easy fatigability.  She recently saw her PCP on 03/01/2021 where she was seen in rapid A. fib.  Reportedly, she was already taking half a tablet of verapamil.  She was admitted to the hospital for rapid A. fib.  She was treated with IV Cardizem infusion.  Heart rate improved.  Metoprolol was added to verapamil for adequate rate control.      Assessment/Plan:   Active Problems:   A-fib (HCC)   Atrial fibrillation with rapid ventricular response (HCC)   Body mass index is 22.42 kg/m.    Atrial fibrillation with RVR: She is off of IV Cardizem infusion.  Continue verapamil.  Add metoprolol for adequate rate control.  Monitor heart rate overnight.  She is not on long-term anticoagulation because of history of GI bleed and large nonresectable ileocecal tubular adenoma.  COPD exacerbation, bronchiectasis, cardiopulmonary hypertension, chronic hypoxic respiratory failure: Continue prednisone and bronchodilators.  Continue 2 L/min oxygen via nasal cannula.  Symptomatic chronic iron deficiency anemia: H&H stable s/p transfusion 1 unit of PRBCs on 03/19/2021.  Monitor H&H.  Continue ferrous gluconate.  Low normal vitamin B12 level (236): Start vitamin B12 supplement.  Mild hyperkalemia: resolved  Plan for discharge to home  tomorrow.       Diet Order            Diet Heart Room service appropriate? Yes; Fluid consistency: Thin  Diet effective now                    Consultants:  None  Procedures:  None    Medications:   . sodium chloride   Intravenous Once  . calcium carbonate  1 tablet Oral BID WC  . doxepin  30 mg Oral QHS  . [START ON 03/21/2021] ferrous gluconate  324 mg Oral Q breakfast  . fluticasone  2 spray Each Nare Daily  . Fluticasone-Umeclidin-Vilant  1 puff Inhalation Daily  . guaiFENesin  600 mg Oral BID  . ipratropium-albuterol  3 mL Nebulization Q6H  . methylPREDNISolone (SOLU-MEDROL) injection  40 mg Intravenous Q12H   Followed by  . [START ON 03/21/2021] predniSONE  40 mg Oral Q breakfast  . metoprolol tartrate  25 mg Oral BID  . oxybutynin  15 mg Oral QHS  . pantoprazole (PROTONIX) IV  40 mg Intravenous Q12H  . sodium chloride flush  3 mL Intravenous Q12H  . verapamil  180 mg Oral QHS  . cyanocobalamin  1,000 mcg Oral Daily   Continuous Infusions:   Anti-infectives (From admission, onward)   None             Family Communication/Anticipated D/C date and plan/Code Status   DVT prophylaxis: Place and maintain sequential compression device Start: 03/20/21 1045 SCDs Start: 03/19/21 1906     Code Status: Full Code  Family Communication: none Disposition Plan:    Status is: Inpatient  Remains inpatient appropriate because:Inpatient level of care appropriate due to severity of illness   Dispo: The patient is from: Home              Anticipated d/c is to: Home              Patient currently is not medically stable to d/c.   Difficult to place patient No           Subjective:   c/o generalized weakness.  She feels a lot better.  No shortness of breath or chest pain.  Objective:    Vitals:   03/20/21 0954 03/20/21 1000 03/20/21 1030 03/20/21 1113  BP: 93/71 114/67 110/61 135/70  Pulse: 67 62 (!) 59 76  Resp: 20 (!) 23 (!) 25 18   Temp: 98 F (36.7 C)   98.1 F (36.7 C)  TempSrc: Oral   Oral  SpO2: 96% 92% 96% 98%  Weight:    52.1 kg  Height:    5' (1.524 m)   No data found.   Intake/Output Summary (Last 24 hours) at 03/20/2021 1441 Last data filed at 03/20/2021 0253 Gross per 24 hour  Intake 802.4 ml  Output --  Net 802.4 ml   Filed Weights   03/20/21 1113  Weight: 52.1 kg    Exam:  GEN: NAD SKIN: No rash EYES: EOMI ENT: MMM CV: RRR PULM: CTA B ABD: soft, ND, NT, +BS CNS: AAO x 3, non focal EXT: No edema or tenderness        Data Reviewed:   I have personally reviewed following labs and imaging studies:  Labs: Labs show the following:   Basic Metabolic Panel: Recent Labs  Lab 03/19/21 1545 03/20/21 0413  NA 140 137  K 5.3* 4.8  CL 100 98  CO2 32 29  GLUCOSE 141* 195*  BUN 31* 31*  CREATININE 0.66 0.80  CALCIUM 9.4 9.2  MG 1.9  --    GFR Estimated Creatinine Clearance: 38.3 mL/min (by C-G formula based on SCr of 0.8 mg/dL). Liver Function Tests: Recent Labs  Lab 03/19/21 1545 03/20/21 0413  AST 32 41  ALT 16 18  ALKPHOS 60 53  BILITOT 0.5 0.8  PROT 7.0 6.8  ALBUMIN 3.9 3.8   No results for input(s): LIPASE, AMYLASE in the last 168 hours. No results for input(s): AMMONIA in the last 168 hours. Coagulation profile No results for input(s): INR, PROTIME in the last 168 hours.  CBC: Recent Labs  Lab 03/19/21 1545 03/19/21 1945 03/20/21 0413 03/20/21 1132  WBC 8.2  --  6.6  --   NEUTROABS 6.1  --   --   --   HGB 7.2* 7.2* 8.9* 9.4*  HCT 26.6*  --  30.7*  --   MCV 69.1*  --  71.9*  --   PLT 361  --  314  --    Cardiac Enzymes: No results for input(s): CKTOTAL, CKMB, CKMBINDEX, TROPONINI in the last 168 hours. BNP (last 3 results) No results for input(s): PROBNP in the last 8760 hours. CBG: No results for input(s): GLUCAP in the last 168 hours. D-Dimer: No results for input(s): DDIMER in the last 72 hours. Hgb A1c: No results for input(s): HGBA1C in  the last 72 hours. Lipid Profile: No results for input(s): CHOL, HDL, LDLCALC, TRIG, CHOLHDL, LDLDIRECT in the last 72 hours. Thyroid function studies: Recent Labs    03/19/21 1945  TSH 1.700   Anemia work up: Recent [employer]  03/19/21 1945  VITAMINB12 236  FOLATE 23.0  FERRITIN 4*  TIBC 721*  IRON 11*  RETICCTPCT 1.4   Sepsis Labs: Recent Labs  Lab 03/19/21 1545 03/20/21 0413  WBC 8.2 6.6    Microbiology Recent Results (from the past 240 hour(s))  SARS CORONAVIRUS 2 (TAT 6-24 HRS) Nasopharyngeal Nasopharyngeal Swab     Status: None   Collection Time: 03/19/21  3:45 PM   Specimen: Nasopharyngeal Swab  Result Value Ref Range Status   SARS Coronavirus 2 NEGATIVE NEGATIVE Final    Comment: (NOTE) SARS-CoV-2 target nucleic acids are NOT DETECTED.  The SARS-CoV-2 RNA is generally detectable in upper and lower respiratory specimens during the acute phase of infection. Negative results do not preclude SARS-CoV-2 infection, do not rule out co-infections with other pathogens, and should not be used as the sole basis for treatment or other patient management decisions. Negative results must be combined with clinical observations, patient history, and epidemiological information. The expected result is Negative.  Fact Sheet for Patients: SugarRoll.be  Fact Sheet for Healthcare Providers: https://www.woods-mathews.com/  This test is not yet approved or cleared by the [place] FDA and  has been authorized for detection and/or diagnosis of SARS-CoV-2 by FDA under an Emergency Use Authorization (EUA). This EUA will remain  in effect (meaning this test can be used) for the duration of the COVID-19 declaration under Se ction 564(b)(1) of the Act, 21 U.S.C. section 360bbb-3(b)(1), unless the authorization is terminated or revoked sooner.  Performed at [facility], [place] [address] [place], [place] [address]      Procedures and diagnostic studies:  DG Chest Portable 1 View  Result Date: 03/19/2021 CLINICAL DATA:  85 year old female with COPD exacerbation. EXAM: PORTABLE CHEST 1 VIEW COMPARISON:  Chest radiograph dated 08/24/2020. FINDINGS: There is eventration of the left hemidiaphragm with left lung base atelectasis/scarring. There is background of emphysema and chronic interstitial coarsening and bronchitic changes. No focal consolidation, pleural effusion, pneumothorax. Stable cardiomegaly. Atherosclerotic calcification of the aorta. No acute osseous pathology. IMPRESSION: 1. No acute cardiopulmonary process. 2. Emphysema and chronic interstitial coarsening. Electronically Signed   By: Anner Crete M.D.   On: 03/19/2021 16:23               LOS: 1 day   Devontay Celaya  [facility]   Pager on [email or web address]. If 7PM-7AM, please contact night-coverage at [email or web address]     03/20/2021, 2:41 PM

## 2021-03-20 NOTE — ED Notes (Signed)
Patient assisted with breakfast tray.

## 2021-03-20 NOTE — ED Notes (Signed)
Pt awake and alert but tearful and anxious at times - pt states she has recently been having panic attacks at home but has not ever been treated for them.  Pt remains on 4L O2 via [place] -- O2 sats 97%.  With exertion/speaking pt speaking in broken sentences.  Continuous cardiac and pulse ox maintained.  1 unit prbcs infusing without complication @100ml /hr to 20G R FA; dressing dry and intact.  Pt has requested to use bathroom and has been offered purewick but requests something for anxiety first.  Will send secure chat to on-call hospitalist.  No other needs verbalized at this time.

## 2021-03-20 NOTE — ED Notes (Signed)
Dr Prudy Feeler notified at this time that Cardizem drip stopped at this time due to low bp 87/46 (MAP 53) -- awaiting reply at this time

## 2021-03-21 DIAGNOSIS — I4891 Unspecified atrial fibrillation: Principal | ICD-10-CM

## 2021-03-21 LAB — HEMOGLOBIN: Hemoglobin: 9.3 g/dL — ABNORMAL LOW (ref 12.0–15.0)

## 2021-03-21 MED ORDER — METOPROLOL TARTRATE 25 MG PO TABS: 25.0000 mg | ORAL_TABLET | Freq: Two times a day (BID) | ORAL | 0 refills | Status: DC

## 2021-03-21 MED ORDER — PREDNISONE 20 MG PO TABS: 40.0000 mg | ORAL_TABLET | Freq: Every day | ORAL | 0 refills | Status: AC

## 2021-03-21 NOTE — Discharge Summary (Signed)
Discharge summary was mislabeled  For full discharge summary please refer to "progress note" dated 03/21/2021, date of service 2:19 PM, file time 2:28 PM  Ralene Muskrat MD

## 2021-03-21 NOTE — Evaluation (Signed)
Physical Therapy Evaluation Patient Details Name: Jackie Turner MRN: [id number] DOB: 1936/08/18 Today's Date: 03/21/2021   History of Present Illness  Pt admitted for Afib with complaint of weakness and SOB symptoms. HIstory includes COPD (2L of O2 chronic), Afib, and HLD.  Clinical Impression  Pt is a pleasant 85 year old female who was admitted for Afib. HR and O2 levels monitored with exertion. Difficulty with machine registering pulse ox, however was 98% with exertion. Pt demonstrates all bed mobility/transfers/ambulation at baseline level. Pt does not require any further PT needs at this time- pt in agreement. Has good friends/family/neighbor support. Pt will be dc in house and does not require follow up. RN aware. Will dc current orders.     Follow Up Recommendations No PT follow up    Equipment Recommendations  None recommended by PT    Recommendations for Other Services       Precautions / Restrictions Precautions Precautions: Fall Restrictions Weight Bearing Restrictions: No      Mobility  Bed Mobility               General bed mobility comments: received in recliner, not assessed    Transfers Overall transfer level: Modified independent Equipment used: None             General transfer comment: upright posture noted with ability to push from seated surface.  Ambulation/Gait Ambulation/Gait assistance: Supervision Gait Distance (Feet): 100 Feet Assistive device: None Gait Pattern/deviations: Step-through pattern     General Gait Details: ambulated distance "similar to the length of my house". Reciprocal gait pattern noted. Does not appear SOB with exertion. All ambulation performed on 3L of O2 with sats at 98%. HR at 105 with exertion.  Stairs            Wheelchair Mobility    Modified Rankin (Stroke Patients Only)       Balance Overall balance assessment: No apparent balance deficits (not formally assessed)                                            Pertinent Vitals/Pain Pain Assessment: No/denies pain    Home Living Family/patient expects to be discharged to:: Private residence Living Arrangements: Alone Available Help at Discharge: Family;Friend(s);Available PRN/intermittently Type of Home: House Home Access: Stairs to enter Entrance Stairs-Rails: Right Entrance Stairs-Number of Steps: 5 Home Layout: One level Home Equipment: Cane - single point;Walker - 4 wheels;Shower seat;Hand held shower head      Prior Function Level of Independence: Independent         Comments: generally indep, does admit to furniture cruising. Does have assist for driving and groceries. Reports no recent falls     Hand Dominance        Extremity/Trunk Assessment   Upper Extremity Assessment Upper Extremity Assessment: Overall WFL for tasks assessed    Lower Extremity Assessment Lower Extremity Assessment: Overall WFL for tasks assessed       Communication   Communication: No difficulties  Cognition Arousal/Alertness: Awake/alert Behavior During Therapy: WFL for tasks assessed/performed Overall Cognitive Status: Within Functional Limits for tasks assessed                                        General Comments      Exercises  Assessment/Plan    PT Assessment Patent does not need any further PT services  PT Problem List         PT Treatment Interventions      PT Goals (Current goals can be found in the Care Plan section)  Acute Rehab PT Goals Patient Stated Goal: to stay out of the hospital PT Goal Formulation: All assessment and education complete, DC therapy Time For Goal Achievement: 03/21/21 Potential to Achieve Goals: Good    Frequency     Barriers to discharge        Co-evaluation               AM-PAC PT "6 Clicks" Mobility  Outcome Measure Help needed turning from your back to your side while in a flat bed without using bedrails?:  None Help needed moving from lying on your back to sitting on the side of a flat bed without using bedrails?: None Help needed moving to and from a bed to a chair (including a wheelchair)?: None Help needed standing up from a chair using your arms (e.g., wheelchair or bedside chair)?: None Help needed to walk in hospital room?: A Little Help needed climbing 3-5 steps with a railing? : A Little 6 Click Score: 22    End of Session Equipment Utilized During Treatment: Gait belt;Oxygen Activity Tolerance: Patient tolerated treatment well Patient left: in chair Nurse Communication: Mobility status PT Visit Diagnosis: Muscle weakness (generalized) (M62.81)    Time: 9373-4287 PT Time Calculation (min) (ACUTE ONLY): 24 min   Charges:   PT Evaluation $PT Eval Low Complexity: 1 Low PT Treatments $Gait Training: 8-22 mins        Greggory Stallion, PT, DPT [phone]   Eola Waldrep 03/21/2021, 1:45 PM

## 2021-03-21 NOTE — Discharge Instructions (Signed)

## 2021-03-21 NOTE — Discharge Summary (Signed)
Physician Discharge Summary  Jackie Turner YQI:[id number] DOB: October 24, 1936 DOA: 03/19/2021  PCP: Mar Daring, PA-C  Admit date: 03/19/2021 Discharge date: 03/21/2021  Admitted From: Home Disposition: Home  Recommendations for Outpatient Follow-up:  1. Follow up with PCP in 1-2 weeks 2. Follow-up cardiology in 1 week 3. Follow-up oncology Dr. Rogue Bussing as directed  Home Health: No Equipment/Devices: Oxygen 2 L Discharge Condition: Stable CODE STATUS: Full Diet recommendation: Regular Brief/Interim Summary: Jackie Turner is a 85 y.o. female with medical history significant for COPD, bronchiectasis, pulmonary hypertension, chronic hypoxic respiratory failure on 2 L/min oxygen at home, atrial fibrillation (taken off of Eliquis because of GI bleed), iron deficiency anemia, nonresectable ileocecal valve tubular adenoma.  From the oncology clinic to the emergency room because of tachycardia (A. fib), low hemoglobin of 7.2, shortness of breath, easy fatigability.  She recently saw her PCP on 03/01/2021 where she was seen in rapid A. fib.  Reportedly, she was already taking half a tablet of verapamil.  She was admitted to the hospital for rapid A. fib.  She was treated with IV Cardizem infusion.  Heart rate improved.  Metoprolol was added to verapamil for adequate rate control.  On the day of discharge patient was ambulated without desaturation or shortness of breath.  Heart rate control much improved.  Will discharge home at this time.  Continue verapamil per home dose.  Metoprolol tartrate 25 mg p.o. twice daily added for improved rate control.  Prednisone 40 mg p.o. daily x4 days added to complete course of treatment for mild exacerbation of COPD.  Patient's not wheezing, normal work of breathing, back on 2 L.  Evaluated by physical therapy on the day of discharge.  No PT follow-up recommended as patient is back at her baseline level of functioning.  Discharge Diagnoses:   Active Problems:   A-fib [facility])   Atrial fibrillation with rapid ventricular response (HCC)   Atrial fibrillation with RVR  She is off of IV Cardizem infusion.   Continue verapamil.   Add metoprolol for adequate rate control.   Has been stable on this regimen Will discharge on home dose of verapamil, add metoprolol tartrate 25 twice daily for improved control Not a candidate for anticoagulation due to history of GI bleed and large nonresectable ileocecal tubular adenoma  COPD exacerbation, bronchiectasis, cardiopulmonary hypertension, chronic hypoxic respiratory failure:  We will treat empirically for mild COPD exacerbation considering respiratory distress on admission Continue home inhaler regimen Add prednisone 40 mg a day x4 days following discharge Continue to liters oxygen via nasal cannula at home rate  Symptomatic chronic iron deficiency anemia H&H stable s/p transfusion 1 unit of PRBCs on 03/19/2021.  H&H stable after transfusion Continue home ferrous gluconate  Monitor H&H.  Continue ferrous gluconate.  Low normal vitamin B12 level (236): Start vitamin B12 supplement.  Mild hyperkalemia: resolved  Discharge Instructions  Discharge Instructions    Diet - low sodium heart healthy   Complete by: As directed    Increase activity slowly   Complete by: As directed      Allergies as of 03/21/2021      Reactions   Nsaids Other (See Comments)   History of gastric ulcer   Amoxicillin Nausea Only, Rash   Ibandronic Acid Other (See Comments)   Muscle weakness - advised not to take **BONIVA** - drug name   Actonel  [risedronate] Other (See Comments)   Gastric ulcers   Antihistamines, Chlorpheniramine-type    Aspirin    Gastric  ulcer   Buspar [buspirone] Other (See Comments)   Pt does not remember reaction    Butalbital-aspirin-caffeine Other (See Comments)   Other reaction(s): Unknown   Clarithromycin Other (See Comments)   Bloating *BIAXIN*   Codeine     Gatifloxacin Other (See Comments)   Hydrocodone-guaifenesin Nausea Only   CODICLEAR DH STYRUP    Moxifloxacin    Oxycodone Other (See Comments)   Dizziness   Penicillins Other (See Comments)   Risedronate Sodium    Gastric ulcer   Tylenol With Codeine #3  [acetaminophen-codeine]    Other reaction(s): Unknown   Raloxifene Other (See Comments)   Bloating Bloating *EVISTA*      Medication List    TAKE these medications   calcium carbonate 1500 (600 Ca) MG Tabs tablet Commonly known as: OSCAL Take 1 tablet by mouth 2 (two) times daily with a meal.   cyanocobalamin 1000 MCG tablet Take 1 tablet (1,000 mcg total) by mouth daily.   dicyclomine 10 MG capsule Commonly known as: BENTYL Take 1 capsule (10 mg total) by mouth 3 (three) times daily as needed for spasms.   doxepin 10 MG capsule Commonly known as: SINEQUAN TAKE 3 CAPSULES BY MOUTH AT BEDTIME   feeding supplement Liqd Take 237 mLs by mouth 2 (two) times daily between meals.   ferrous gluconate 324 MG tablet Commonly known as: FERGON Take 1 tablet (324 mg total) by mouth daily with breakfast.   fluticasone 50 MCG/ACT nasal spray Commonly known as: FLONASE USE 2 SPRAYS IN EACH NOSTRIL ONCE A DAY.   furosemide 40 MG tablet Commonly known as: LASIX Take 1 tablet (40 mg total) by mouth daily as needed for edema.   metoprolol tartrate 25 MG tablet Commonly known as: LOPRESSOR Take 1 tablet (25 mg total) by mouth 2 (two) times daily.   nystatin 100000 UNIT/ML suspension Commonly known as: MYCOSTATIN TAKE 5 MLS BY MOUTH 4 TIMES A DAY.   oxybutynin 15 MG 24 hr tablet Commonly known as: DITROPAN XL Take 1 tablet (15 mg total) by mouth at bedtime.   pentosan polysulfate 100 MG capsule Commonly known as: ELMIRON Take 1 capsule (100 mg total) by mouth 2 (two) times daily as needed (urinary pain).   predniSONE 20 MG tablet Commonly known as: DELTASONE Take 2 tablets (40 mg total) by mouth daily with breakfast for  4 days. Start taking on: March 22, 2021   traMADol 50 MG tablet Commonly known as: ULTRAM TAKE 1 TABLET BY MOUTH EVERY 8 HOURS AS NEEDED FOR PAIN   Trelegy Ellipta 100-62.5-25 MCG/INH Aepb Generic drug: Fluticasone-Umeclidin-Vilant Inhale 1 puff into the lungs daily.   Urea 40 % Lotn Apply 1 application topically daily as needed (dry skin).   Ventolin HFA 108 (90 Base) MCG/ACT inhaler Generic drug: albuterol INHALE TWO PUFFS INTO THE LUNGS EVERY 4 HOURS AS NEEDED FOR WHEEZING OR SHORTNESS OF BREATH What changed:   how much to take  how to take this  when to take this  reasons to take this  additional instructions   albuterol (2.5 MG/3ML) 0.083% nebulizer solution Commonly known as: PROVENTIL Take 3 mLs (2.5 mg total) by nebulization every 6 (six) hours as needed for wheezing or shortness of breath. What changed: Another medication with the same name was changed. Make sure you understand how and when to take each.   verapamil 120 MG CR tablet Commonly known as: CALAN-SR Take 1 tablet (120 mg total) by mouth at bedtime.  Follow-up Information    Mar Daring, PA-C. Schedule an appointment as soon as possible for a visit in 1 week(s).   Specialty: Family Medicine Contact information: [place] [address] [phone]        Isaias Cowman, MD. Schedule an appointment as soon as possible for a visit in 1 week(s).   Specialty: Cardiology Why: Follow up with Dr. Saralyn Pilar in 1-2 weeks Contact information: [facility] [place] [address] [phone]              Allergies  Allergen Reactions  . Nsaids Other (See Comments)    History of gastric ulcer  . Amoxicillin Nausea Only and Rash  . Ibandronic Acid Other (See Comments)    Muscle weakness - advised not to take  **BONIVA** - drug name  . Actonel  [Risedronate] Other (See Comments)    Gastric ulcers  .  Antihistamines, Chlorpheniramine-Type   . Aspirin     Gastric ulcer  . Buspar [Buspirone] Other (See Comments)    Pt does not remember reaction   . Butalbital-Aspirin-Caffeine Other (See Comments)    Other reaction(s): Unknown  . Clarithromycin Other (See Comments)    Bloating  *BIAXIN*  . Codeine   . Gatifloxacin Other (See Comments)  . Hydrocodone-Guaifenesin Nausea Only    CODICLEAR DH STYRUP   . Moxifloxacin   . Oxycodone Other (See Comments)    Dizziness  . Penicillins Other (See Comments)  . Risedronate Sodium     Gastric ulcer  . Tylenol With Codeine #3  [Acetaminophen-Codeine]     Other reaction(s): Unknown  . Raloxifene Other (See Comments)    Bloating Bloating  *EVISTA*    Consultations:  None   Procedures/Studies: DG Chest Portable 1 View  Result Date: 03/19/2021 CLINICAL DATA:  85 year old female with COPD exacerbation. EXAM: PORTABLE CHEST 1 VIEW COMPARISON:  Chest radiograph dated 08/24/2020. FINDINGS: There is eventration of the left hemidiaphragm with left lung base atelectasis/scarring. There is background of emphysema and chronic interstitial coarsening and bronchitic changes. No focal consolidation, pleural effusion, pneumothorax. Stable cardiomegaly. Atherosclerotic calcification of the aorta. No acute osseous pathology. IMPRESSION: 1. No acute cardiopulmonary process. 2. Emphysema and chronic interstitial coarsening. Electronically Signed   By: Anner Crete M.D.   On: 03/19/2021 16:23    (Echo, Carotid, EGD, Colonoscopy, ERCP)    Subjective: Patient seen and examined on the day of discharge.  Stable, no distress.  Work with PT, ambulated without difficulty, no PT follow-up recommended.  Stable for discharge home at this time.  Discharge Exam: Vitals:   03/21/21 0830 03/21/21 1304  BP:    Pulse:    Resp:    Temp:    SpO2: 96% 95%   Vitals:   03/21/21 0333 03/21/21 0745 03/21/21 0830 03/21/21 1304  BP: 136/88 129/78    Pulse: 97 (!) 105     Resp: 20 20    Temp:  98 F (36.7 C)    TempSrc:      SpO2: 100% 99% 96% 95%  Weight:      Height:        General: Pt is alert, awake, not in acute distress Cardiovascular: RRR, S1/S2 +, no rubs, no gallops Respiratory: CTA bilaterally, no wheezing, no rhonchi Abdominal: Soft, NT, ND, bowel sounds + Extremities: no edema, no cyanosis    The results of significant diagnostics from this hospitalization (including imaging, microbiology, ancillary and laboratory) are listed below for reference.  Microbiology: Recent Results (from the past 240 hour(s))  SARS CORONAVIRUS 2 (TAT 6-24 HRS) Nasopharyngeal Nasopharyngeal Swab     Status: None   Collection Time: 03/19/21  3:45 PM   Specimen: Nasopharyngeal Swab  Result Value Ref Range Status   SARS Coronavirus 2 NEGATIVE NEGATIVE Final    Comment: (NOTE) SARS-CoV-2 target nucleic acids are NOT DETECTED.  The SARS-CoV-2 RNA is generally detectable in upper and lower respiratory specimens during the acute phase of infection. Negative results do not preclude SARS-CoV-2 infection, do not rule out co-infections with other pathogens, and should not be used as the sole basis for treatment or other patient management decisions. Negative results must be combined with clinical observations, patient history, and epidemiological information. The expected result is Negative.  Fact Sheet for Patients: SugarRoll.be  Fact Sheet for Healthcare Providers: https://www.woods-mathews.com/  This test is not yet approved or cleared by the [place] FDA and  has been authorized for detection and/or diagnosis of SARS-CoV-2 by FDA under an Emergency Use Authorization (EUA). This EUA will remain  in effect (meaning this test can be used) for the duration of the COVID-19 declaration under Se ction 564(b)(1) of the Act, 21 U.S.C. section 360bbb-3(b)(1), unless the authorization is terminated or revoked  sooner.  Performed at [facility], [place] [address] [place], [place] [address]      Labs: BNP (last 3 results) Recent Labs    08/07/20 0543 08/10/20 0522 03/19/21 1945  BNP 232.1* 249.7* 517.6*   Basic Metabolic Panel: Recent Labs  Lab 03/19/21 1545 03/20/21 0413  NA 140 137  K 5.3* 4.8  CL 100 98  CO2 32 29  GLUCOSE 141* 195*  BUN 31* 31*  CREATININE 0.66 0.80  CALCIUM 9.4 9.2  MG 1.9  --    Liver Function Tests: Recent Labs  Lab 03/19/21 1545 03/20/21 0413  AST 32 41  ALT 16 18  ALKPHOS 60 53  BILITOT 0.5 0.8  PROT 7.0 6.8  ALBUMIN 3.9 3.8   No results for input(s): LIPASE, AMYLASE in the last 168 hours. No results for input(s): AMMONIA in the last 168 hours. CBC: Recent Labs  Lab 03/19/21 1545 03/19/21 1945 03/20/21 0413 03/20/21 1132 03/20/21 1938 03/21/21 0310  WBC 8.2  --  6.6  --   --   --   NEUTROABS 6.1  --   --   --   --   --   HGB 7.2* 7.2* 8.9* 9.4* 9.3* 9.3*  HCT 26.6*  --  30.7*  --   --   --   MCV 69.1*  --  71.9*  --   --   --   PLT 361  --  314  --   --   --    Cardiac Enzymes: No results for input(s): CKTOTAL, CKMB, CKMBINDEX, TROPONINI in the last 168 hours. BNP: Invalid input(s): POCBNP CBG: No results for input(s): GLUCAP in the last 168 hours. D-Dimer No results for input(s): DDIMER in the last 72 hours. Hgb A1c No results for input(s): HGBA1C in the last 72 hours. Lipid Profile No results for input(s): CHOL, HDL, LDLCALC, TRIG, CHOLHDL, LDLDIRECT in the last 72 hours. Thyroid function studies Recent Labs    03/19/21 1945  TSH 1.700   Anemia work up Recent Labs    03/19/21 1945  VITAMINB12 236  FOLATE 23.0  FERRITIN 4*  TIBC 721*  IRON 11*  RETICCTPCT 1.4   Urinalysis    Component Value Date/Time  COLORURINE YELLOW (A) 03/20/2021 0552   APPEARANCEUR HAZY (A) 03/20/2021 0552   LABSPEC 1.019 03/20/2021 0552   PHURINE 5.0 03/20/2021 0552   GLUCOSEU NEGATIVE 03/20/2021 0552   HGBUR NEGATIVE  03/20/2021 0552   BILIRUBINUR NEGATIVE 03/20/2021 0552   BILIRUBINUR negative 12/31/2017 1646   KETONESUR NEGATIVE 03/20/2021 0552   PROTEINUR NEGATIVE 03/20/2021 0552   UROBILINOGEN 0.2 12/31/2017 1646   NITRITE NEGATIVE 03/20/2021 0552   LEUKOCYTESUR TRACE (A) 03/20/2021 0552   Sepsis Labs Invalid input(s): PROCALCITONIN,  WBC,  LACTICIDVEN Microbiology Recent Results (from the past 240 hour(s))  SARS CORONAVIRUS 2 (TAT 6-24 HRS) Nasopharyngeal Nasopharyngeal Swab     Status: None   Collection Time: 03/19/21  3:45 PM   Specimen: Nasopharyngeal Swab  Result Value Ref Range Status   SARS Coronavirus 2 NEGATIVE NEGATIVE Final    Comment: (NOTE) SARS-CoV-2 target nucleic acids are NOT DETECTED.  The SARS-CoV-2 RNA is generally detectable in upper and lower respiratory specimens during the acute phase of infection. Negative results do not preclude SARS-CoV-2 infection, do not rule out co-infections with other pathogens, and should not be used as the sole basis for treatment or other patient management decisions. Negative results must be combined with clinical observations, patient history, and epidemiological information. The expected result is Negative.  Fact Sheet for Patients: SugarRoll.be  Fact Sheet for Healthcare Providers: https://www.woods-mathews.com/  This test is not yet approved or cleared by the [place] FDA and  has been authorized for detection and/or diagnosis of SARS-CoV-2 by FDA under an Emergency Use Authorization (EUA). This EUA will remain  in effect (meaning this test can be used) for the duration of the COVID-19 declaration under Se ction 564(b)(1) of the Act, 21 U.S.C. section 360bbb-3(b)(1), unless the authorization is terminated or revoked sooner.  Performed at [facility], [place] [address], [place], [place] [address]      Time coordinating discharge: Over 30 minutes  SIGNED:   Sidney Ace, MD  [facility] 03/21/2021, 2:19 PM Pager   If 7PM-7AM, please contact night-coverage

## 2021-03-21 NOTE — Plan of Care (Signed)

## 2021-03-21 NOTE — Evaluation (Signed)
Occupational Therapy Evaluation Patient Details Name: Jackie Turner MRN: [id number] DOB: December 15, 1936 Today's Date: 03/21/2021    History of Present Illness Pt admitted for Afib with complaint of weakness and SOB symptoms. HIstory includes COPD (2L of O2 chronic), Afib, and HLD.   Clinical Impression   Pt seen for OT evaluation in setting of acute hospitalization d/t SOB and found to be in Afib, her HR appears stable at time of therapy assessment. Pt reports being INDEP at baseline with basic ADLs and states that she does perform HH IADLs, but has been struggling with vacuuming lately and only does light meal preparation at baseline d/t limited tolerance and appetite. Pt presents this date with some decreased fxl activity tolerance impacting ADLs/ADL mobility, but she is overall able to perform tasks at her self-reported baseline. She does benefit from UE support on RW, but does not demo gross LOB w/o RW use. Used more for energy conservation. Do not anticipate need for continued OT services in acute setting or upon d/c home at this time.     Follow Up Recommendations  No OT follow up    Equipment Recommendations  Tub/shower seat    Recommendations for Other Services       Precautions / Restrictions Precautions Precautions: Fall Restrictions Weight Bearing Restrictions: No      Mobility Bed Mobility               General bed mobility comments: received in recliner, not assessed    Transfers Overall transfer level: Modified independent Equipment used: None             General transfer comment: tried w/ and w/o RW. Pt with no gross LOB w/o, but benefits from use    Balance Overall balance assessment: No apparent balance deficits (not formally assessed)                                         ADL either performed or assessed with clinical judgement   ADL Overall ADL's : Modified independent                                        General ADL Comments: pt typically able to amb w/o RW, but benefits from one this date d/t some decreased standing balance and tolerance.     Vision Patient Visual Report: No change from baseline       Perception     Praxis      Pertinent Vitals/Pain Pain Assessment: No/denies pain     Hand Dominance     Extremity/Trunk Assessment Upper Extremity Assessment Upper Extremity Assessment: Overall WFL for tasks assessed   Lower Extremity Assessment Lower Extremity Assessment: Overall WFL for tasks assessed       Communication Communication Communication: No difficulties   Cognition Arousal/Alertness: Awake/alert Behavior During Therapy: WFL for tasks assessed/performed Overall Cognitive Status: Within Functional Limits for tasks assessed                                     General Comments       Exercises Other Exercises Other Exercises: OT educates re: role of OT and assesses pt spO2 and HR throughout session both at rest and with activity.  On 3L, pt's sats never get lower than 93%, even with activity, and highest HR assessed is 113bpm with pt's VS WFL for resting/activity and HR appearing to ascend/descend appropriately.   Shoulder Instructions      Home Living Family/patient expects to be discharged to:: Private residence Living Arrangements: Alone Available Help at Discharge: Family;Friend(s);Available PRN/intermittently Type of Home: House Home Access: Stairs to enter [employer] of Steps: 5 Entrance Stairs-Rails: Right Home Layout: One level               Home Equipment: Cane - single point;Walker - 4 wheels;Shower seat;Hand held shower head          Prior Functioning/Environment Level of Independence: Independent        Comments: generally indep, does admit to furniture cruising. Does have assist for driving and groceries. Reports no recent falls        OT Problem List: Decreased activity  tolerance;Cardiopulmonary status limiting activity;Decreased strength      OT Treatment/Interventions: Self-care/ADL training;Therapeutic exercise;Therapeutic activities;Energy conservation    OT Goals(Current goals can be found in the care plan section) Acute Rehab OT Goals Patient Stated Goal: to stay out of the hospital OT Goal Formulation: All assessment and education complete, DC therapy  OT Frequency: Min 1X/week   Barriers to D/C:            Co-evaluation              AM-PAC OT "6 Clicks" Daily Activity     Outcome Measure Help from another person eating meals?: None Help from another person taking care of personal grooming?: None Help from another person toileting, which includes using toliet, bedpan, or urinal?: None Help from another person bathing (including washing, rinsing, drying)?: A Little Help from another person to put on and taking off regular upper body clothing?: None Help from another person to put on and taking off regular lower body clothing?: None 6 Click Score: 23   End of Session Equipment Utilized During Treatment: Gait belt;Oxygen Nurse Communication: Mobility status;Other (comment) (O2/HR)  Activity Tolerance: Patient tolerated treatment well Patient left: in chair;with call bell/phone within reach  OT Visit Diagnosis: Muscle weakness (generalized) (M62.81)                Time: 1121-1140 OT Time Calculation (min): 19 min Charges:  OT General Charges $OT Visit: 1 Visit OT Evaluation $OT Eval Low Complexity: 1 Low OT Treatments $Self Care/Home Management : 8-22 mins  Gerrianne Scale, MS, OTR/L ascom [phone] 03/21/21, 4:33 PM

## 2021-03-22 ENCOUNTER — Telehealth: Payer: Self-pay

## 2021-03-22 NOTE — Telephone Encounter (Signed)
Transition Care Management Follow-up Telephone Call  Date of discharge and from where: [facility] on 03/21/21  How have you been since you were released from the hospital? Feeling weak but slept well last night and ate breakfast this morning. Pt planned to take a shower but decided to wait due to weakness. Pt declines using any assistance devices. Declined pain, palpitations, SOB, fever, dizziness or n/v/d.  Any questions or concerns? No   Items Reviewed:  Did the pt receive and understand the discharge instructions provided? Yes   Medications obtained and verified? No , declined verifying medications.  Any new allergies since your discharge? No   Dietary orders reviewed? Yes  Do you have support at home? Yes   Other (ie: DME, Home Health, etc): N/A  Functional Questionnaire: (I = Independent and D = Dependent)  Bathing/Dressing- I   Meal Prep- I   Eating- I  Maintaining continence- I  Transferring/Ambulation- I  Managing Meds- I   Follow up appointments reviewed:    PCP Hospital f/u appt confirmed? Yes  scheduled for a virtual visit on 03/27/21 @ 3:40 PM with Huston Foley Just FNP.  [facility] f/u appt confirmed? Yes    Are transportation arrangements needed? No   If their condition worsens, is the pt aware to call  their PCP or go to the ED? Yes  Was the patient provided with contact information for the PCP's office or ED? Yes  Was the pt encouraged to call back with questions or concerns? Yes

## 2021-03-27 ENCOUNTER — Encounter: Payer: Self-pay | Admitting: Family Medicine

## 2021-03-27 ENCOUNTER — Ambulatory Visit (INDEPENDENT_AMBULATORY_CARE_PROVIDER_SITE_OTHER): Payer: Medicare HMO | Admitting: Family Medicine

## 2021-03-27 ENCOUNTER — Ambulatory Visit: Payer: Self-pay | Admitting: *Deleted

## 2021-03-27 DIAGNOSIS — I4891 Unspecified atrial fibrillation: Secondary | ICD-10-CM

## 2021-03-27 DIAGNOSIS — Z09 Encounter for follow-up examination after completed treatment for conditions other than malignant neoplasm: Secondary | ICD-10-CM | POA: Diagnosis not present

## 2021-03-27 DIAGNOSIS — D509 Iron deficiency anemia, unspecified: Secondary | ICD-10-CM

## 2021-03-27 MED ORDER — METOPROLOL TARTRATE 25 MG PO TABS: 12.5000 mg | ORAL_TABLET | Freq: Two times a day (BID) | ORAL | 0 refills | Status: DC

## 2021-03-27 MED ORDER — FERROUS GLUCONATE 324 (38 FE) MG PO TABS
324.0000 mg | ORAL_TABLET | Freq: Every day | ORAL | 0 refills | Status: DC
Start: 2021-03-27 — End: 2021-07-20

## 2021-03-27 NOTE — Patient Instructions (Signed)

## 2021-03-27 NOTE — Progress Notes (Signed)
Virtual telephone visit    Virtual Visit via Telephone Note   This visit type was conducted due to national recommendations for restrictions regarding the COVID-19 Pandemic (e.g. social distancing) in an effort to limit this patient's exposure and mitigate transmission in our community. Due to her co-morbid illnesses, this patient is at least at moderate risk for complications without adequate follow up. This format is felt to be most appropriate for this patient at this time. The patient did not have access to video technology or had technical difficulties with video requiring transitioning to audio format only (telephone). Physical exam was limited to content and character of the telephone converstion.    Patient location: home no family members present Provider location: Work Office  I discussed the limitations of evaluation and management by telemedicine and the availability of in person appointments. The patient expressed understanding and agreed to proceed.   Visit Date: 03/27/2021  Today's healthcare provider: Laurita Quint Miranda Garber, FNP   Chief Complaint  Patient presents with  . Hospitalization Follow-up   Subjective    HPI  Follow up Hospitalization  Patient was admitted to [facility] on 03/20/2021 and discharged on 03/21/2021. She was treated for Atrial Fibrillation with RVR, Mild acute COPD. Treatment for this included cardizem IV, Added metoprolol 25mg  bid to verapamil for rate control. Was given prednisone 40 mg daily for 4 days. Telephone follow up was done on 03/22/2021 She reports excellent compliance with treatment. Pt states she believes metoprolol is causing dizziness, weakness.  Pt did not take metoprolol this morning and feels some better.    medical history significant for COPD, bronchiectasis, pulmonary hypertension, chronic hypoxic respiratory failure on 2 L/min oxygen at home, atrial fibrillation (taken off of Eliquis because of GI bleed), iron deficiency anemia,  nonresectable ileocecal valve tubular adenoma.  Admittedfrom oncology clinic to the emergency room because of tachycardia (A. fib), low hemoglobin of 7.2, shortness of breath, easy fatigability.   Given 1 RBC in the hospital Started on Vitamin B12 and continue home ferrous sulfate ----------------------------------------------------------------------------------------- -  Feeling much better since didn't take the metoprolol this AM Was causing her dizziness The metoprolol was making her IBS worse Took the last prednisone this morning Checks HR at home Still gets winded with longer walks Will see cardiology on Thursday  Working to be more mobile with her home oxygen   Patient Active Problem List   Diagnosis Date Noted  . Atrial fibrillation with rapid ventricular response ([place]) 03/20/2021  . Microcytic anemia 03/19/2021  . Iron deficiency 03/19/2021  . A-fib ([place]) 03/19/2021  . Memory loss 11/21/2020  . Polyp of colon   . Acute blood loss anemia 08/09/2020  . GI bleeding 08/04/2020  . Depression, major, single episode, in partial remission ([place]) 06/22/2020  . Atrial fibrillation with RVR ([place]) 06/13/2020  . Acute on chronic respiratory failure with hypoxia and hypercapnia ([place]) 06/13/2020  . Right hand pain 06/13/2020  . Fall at home, initial encounter 06/13/2020  . COPD (chronic obstructive pulmonary disease) ([place]) 06/13/2020  . Bronchiectasis without complication ([place]) 84/16/6063  . Lymphedema 05/17/2019  . Chronic venous insufficiency 05/17/2019  . Swelling of limb 02/10/2018  . Varicose veins of leg with swelling, bilateral 02/10/2018  . Squamous cell carcinoma 09/22/2017  . Hypoxia 09/27/2015  . Abnormal CXR 09/27/2015  . Asthma with allergic rhinitis 08/31/2015  . Back ache 08/31/2015  . Chronic interstitial cystitis 08/31/2015  . Grief reaction 08/31/2015  . Hyperlipidemia 06/26/2015  . Migraine 06/26/2015  .  Asthma 06/26/2015  . IBS (irritable bowel syndrome)  06/26/2015  . Osteoporosis 06/26/2015  . Fatigue 06/26/2015  . Lumbar radiculopathy 04/27/2013  . Urinary urgency 01/08/2012  . Frequency of urination 01/08/2012   Past Medical History:  Diagnosis Date  . Allergy   . Asthma   . Hyperlipidemia   . Osteoporosis    Social History   Tobacco Use  . Smoking status: Never Smoker  . Smokeless tobacco: Never Used  Vaping Use  . Vaping Use: Never used  Substance Use Topics  . Alcohol use: No  . Drug use: No   Allergies  Allergen Reactions  . Nsaids Other (See Comments)    History of gastric ulcer  . Amoxicillin Nausea Only and Rash  . Ibandronic Acid Other (See Comments)    Muscle weakness - advised not to take  **BONIVA** - drug name  . Actonel  [Risedronate] Other (See Comments)    Gastric ulcers  . Antihistamines, Chlorpheniramine-Type   . Aspirin     Gastric ulcer  . Buspar [Buspirone] Other (See Comments)    Pt does not remember reaction   . Butalbital-Aspirin-Caffeine Other (See Comments)    Other reaction(s): Unknown  . Clarithromycin Other (See Comments)    Bloating  *BIAXIN*  . Codeine   . Gatifloxacin Other (See Comments)  . Hydrocodone-Guaifenesin Nausea Only    CODICLEAR DH STYRUP   . Moxifloxacin   . Oxycodone Other (See Comments)    Dizziness  . Penicillins Other (See Comments)  . Risedronate Sodium     Gastric ulcer  . Tylenol With Codeine #3  [Acetaminophen-Codeine]     Other reaction(s): Unknown  . Raloxifene Other (See Comments)    Bloating Bloating  *EVISTA*    Medications: Outpatient Medications Prior to Visit  Medication Sig  . calcium carbonate (OSCAL) 1500 (600 Ca) MG TABS tablet Take 1 tablet by mouth 2 (two) times daily with a meal.   . dicyclomine (BENTYL) 10 MG capsule Take 1 capsule (10 mg total) by mouth 3 (three) times daily as needed for spasms.  Marland Kitchen doxepin (SINEQUAN) 10 MG capsule TAKE 3 CAPSULES BY MOUTH AT BEDTIME  . feeding supplement, ENSURE ENLIVE, (ENSURE ENLIVE) LIQD  Take 237 mLs by mouth 2 (two) times daily between meals.  . fluticasone (FLONASE) 50 MCG/ACT nasal spray USE 2 SPRAYS IN EACH NOSTRIL ONCE A DAY.  Marland Kitchen Fluticasone-Umeclidin-Vilant (TRELEGY ELLIPTA) 100-62.5-25 MCG/INH AEPB Inhale 1 puff into the lungs daily.  . furosemide (LASIX) 40 MG tablet Take 1 tablet (40 mg total) by mouth daily as needed for edema.  Marland Kitchen nystatin (MYCOSTATIN) 100000 UNIT/ML suspension TAKE 5 MLS BY MOUTH 4 TIMES A DAY.  Marland Kitchen oxybutynin (DITROPAN XL) 15 MG 24 hr tablet Take 1 tablet (15 mg total) by mouth at bedtime.  . pentosan polysulfate (ELMIRON) 100 MG capsule Take 1 capsule (100 mg total) by mouth 2 (two) times daily as needed (urinary pain).  . traMADol (ULTRAM) 50 MG tablet TAKE 1 TABLET BY MOUTH EVERY 8 HOURS AS NEEDED FOR PAIN  . Urea 40 % LOTN Apply 1 application topically daily as needed (dry skin).   . VENTOLIN HFA 108 (90 Base) MCG/ACT inhaler INHALE TWO PUFFS INTO THE LUNGS EVERY 4 HOURS AS NEEDED FOR WHEEZING OR SHORTNESS OF BREATH (Patient taking differently: Inhale 2 puffs into the lungs every 4 (four) hours as needed for wheezing or shortness of breath.)  . verapamil (CALAN-SR) 120 MG CR tablet Take 1 tablet (120 mg total) by mouth  at bedtime.  . vitamin B-12 1000 MCG tablet Take 1 tablet (1,000 mcg total) by mouth daily.  . [DISCONTINUED] metoprolol tartrate (LOPRESSOR) 25 MG tablet Take 1 tablet (25 mg total) by mouth 2 (two) times daily.  Marland Kitchen albuterol (PROVENTIL) (2.5 MG/3ML) 0.083% nebulizer solution Take 3 mLs (2.5 mg total) by nebulization every 6 (six) hours as needed for wheezing or shortness of breath. (Patient not taking: No sig reported)  . [DISCONTINUED] ferrous gluconate (FERGON) 324 MG tablet Take 1 tablet (324 mg total) by mouth daily with breakfast. (Patient not taking: No sig reported)   No facility-administered medications prior to visit.    Review of Systems  Constitutional: Positive for appetite change (Pt reports not having an appetite).  Negative for activity change, chills, diaphoresis, fatigue, fever and unexpected weight change.  Respiratory: Negative.   Cardiovascular: Negative.   Gastrointestinal: Negative.   Neurological: Positive for dizziness. Negative for light-headedness and headaches.      Objective    There were no vitals taken for this visit.  Unable to do vitals or complete physical due to virtual nature of visit.  Constitutional:      General: She is not in acute distress. Pulmonary:     Effort: Pulmonary effort is normal. No respiratory distress.  Neurological:     Mental Status: She is alert and oriented to person, place, and time.  Psychiatric:        Mood and Affect: Mood normal.        Behavior: Behavior normal.    Assessment & Plan     Problem List Items Addressed This Visit      Cardiovascular and Mediastinum   Atrial fibrillation with RVR (HCC)   Relevant Medications   metoprolol tartrate (LOPRESSOR) 25 MG tablet     Other   Microcytic anemia   Relevant Medications   ferrous gluconate (FERGON) 324 MG tablet    Other Visit Diagnoses    Hospital discharge follow-up    -  Primary     Plan Discussed decreasing metoprolol to 12.5 mg bid Will follow up with any dizziness or HR > 110 Continue to follow up with cardiology on Thursday Take ferrous gluconate daily   Return in about 3 months (around 06/26/2021).    I discussed the assessment and treatment plan with the patient. The patient was provided an opportunity to ask questions and all were answered. The patient agreed with the plan and demonstrated an understanding of the instructions.   The patient was advised to call back or seek an in-person evaluation if the symptoms worsen or if the condition fails to improve as anticipated.  I provided 20 minutes of non-face-to-face time during this encounter.    [place], [place] [phone] (phone) [phone] (fax)  [place]

## 2021-03-27 NOTE — Telephone Encounter (Signed)
Pt called in c/o being dizzy every time after taking the metoprolol that was prescribed for her after her hospital stay on 03/21/2021.   She has not taken any today.  It so happens she has a phone call (virtual) visit today at 3:40 with Huston Foley Just for her hospital F/U visit.  Pt was not aware of this.  She will discuss the dizziness from the medication when she talks with Huston Foley during her appt.   Reason for Disposition . Taking a medicine that could cause dizziness (e.g., blood pressure medications, diuretics)    Metoprolol 25 mg twice a day causing her to be dizzy.   Given to her from hospital visit on 03/21/2021.  Answer Assessment - Initial Assessment Questions 1. DESCRIPTION: "Describe your dizziness."     I'm having dizziness from the metoprolol I take twice a day.  My hemoglobin was low so I was sent to the hospital from the doctor's office and admitted.  I think the medicine is making me dizzy.    Every time I take the metoprolol I get dizzy.  This was started in the hospital.  2. LIGHTHEADED: "Do you feel lightheaded?" (e.g., somewhat faint, woozy, weak upon standing)     After I take the medicine I feel dizzy and have to sit down. 3. VERTIGO: "Do you feel like either you or the room is spinning or tilting?" (i.e. vertigo)     No 4. SEVERITY: "How bad is it?"  "Do you feel like you are going to faint?" "Can you stand and walk?"   - MILD: Feels slightly dizzy, but walking normally.   - MODERATE: Feels very unsteady when walking, but not falling; interferes with normal activities (e.g., school, work) .   - SEVERE: Unable to walk without falling, or requires assistance to walk without falling; feels like passing out now.      I'm dizzy and I don't move around much.   I can walk fine without holding onto anything. 5. ONSET:  "When did the dizziness begin?"     Right after taking the first pill of metoprolol after coming home from the hospital. 6. AGGRAVATING FACTORS: "Does anything make it  worse?" (e.g., standing, change in head position)     No moving around suddenly.   7. HEART RATE: "Can you tell me your heart rate?" "How many beats in 15 seconds?"  (Note: not all patients can do this)       Not asked 8. CAUSE: "What do you think is causing the dizziness?"     The metoprolol 9. RECURRENT SYMPTOM: "Have you had dizziness before?" If Yes, ask: "When was the last time?" "What happened that time?"     No 10. OTHER SYMPTOMS: "Do you have any other symptoms?" (e.g., fever, chest pain, vomiting, diarrhea, bleeding)       No    No diarrhea or vomiting. 11. PREGNANCY: "Is there any chance you are pregnant?" "When was your last menstrual period?"       N/A due to age  Protocols used: DIZZINESS [facility]

## 2021-03-28 ENCOUNTER — Other Ambulatory Visit: Payer: Self-pay

## 2021-03-28 ENCOUNTER — Ambulatory Visit: Payer: Self-pay | Admitting: *Deleted

## 2021-03-28 DIAGNOSIS — K921 Melena: Secondary | ICD-10-CM

## 2021-03-28 DIAGNOSIS — D509 Iron deficiency anemia, unspecified: Secondary | ICD-10-CM

## 2021-03-28 NOTE — Telephone Encounter (Signed)
Patient to have labs done, d/c Metoprolol and f/u with Dr Rosanna Randy next week.  Arrangements for all completed.

## 2021-03-28 NOTE — Telephone Encounter (Signed)
Weakness- worse today, patient feels the new BP medication is making her have more frequent bowel movements and feeling fatigued. Patient seems somewhat confused when answering some questions. Due to her confusion- requested her to take her vitals- she is unable- but states she is on O2 at home- she has finger meter and can give reading from that.  Patient reports no dizziness- O2 sat-79%, 62,  76%,58 Patient is on home oxygen- patient is checking her line-after adjusting line and walking- O2 sat at 82% P 69, 99% P 96.  Patient is getting different readings with her her O2 sat monitor- patient is alone at this time. Patient states she does have support person she can call to help her- but they are not available until after lunch. Patient states her fatigue is much worse today than it was yesterday. Patient is contributing her weakness to new BP medication- and she has not taken it this morning. Patient is unable to take her vitals at home- but she is on oxygen and she does have O2 sat- reader. Patient is unsure what the reading mean- advise patient how to read her devise- she states her numbers are very low- after she has been up and moving -they seem to have improved. They do seem to be fluctuating a lot with her readings. Advised patient see may need to be seen today for evaluation- she states she may be able to have transportation this afternoon- advised patient to call EMS for dizziness, SOB, low sat reading below 85%. She states she understands.    Reason for Disposition . [1] Caller has URGENT question AND [2] triager unable to answer question . Taking a medicine that could cause weakness (e.g., blood pressure medications, diuretics)  Answer Assessment - Initial Assessment Questions 1. MAIN CONCERN OR SYMPTOM: "What's your main concern?" (e.g., low oxygen level, breathing difficulty) "What question do you have?"     fatigue 2.  OXYGEN EQUIPMENT:  Are you having trouble with your oxygen  equipment?  (e.g., cannula, mask, tubing, tank, concentrator)     Nasal cannula 3. ONSET: "When did the  fatigue  start?"      Patient is relating her fatigue to the new BP medication 4. OXYGEN THERAPY:    - "Do you currently use home oxygen?" (e.g., yes, no).    - If Yes, ask: "What is your oxygen source?" (e.g., O2 tank, O2 concentrator).    - If Yes, ask: "How do you get the oxygen?" (e.g., nasal prongs, face mask).    - If Yes, ask: "How much oxygen are you supposed to use?" (e.g., 1-2 L [place])     Yes- concentrator 5. PULSE OXIMETER:    - "Do you have a pulse oximeter (pulse ox)?"  (e.g., yes, no)    - If Yes, ask: "Where do you place the probe?" (e.g., fingertip, ear lobe)     Yes- finger tip 6. O2 MONITORING: "What is the oxygen level (pulse ox reading)?" (e.g., 70-100%)     75% sitting 7. VSS MONITORING: "Do you monitor/measure your oxygen level or vital signs (e.g., yes, no, measurements are automatically sent to provider/call center). Document CURRENT and NORMAL BASELINE values if available.     -  P: "What is your pulse rate per minute?"   -  RR: "What is your respiratory rate per minute?"     no 8. BREATHING DIFFICULTY: "Are you having any difficulty breathing?" If Yes, ask: "How bad is it?"  (e.g., none, mild, moderate, severe)   -  MILD: No SOB at rest, mild SOB with walking, speaks normally in sentences, able to lie down, no retractions, pulse < 100.   - MODERATE: SOB at rest, SOB with minimal exertion and prefers to sit, cannot lie down flat, speaks in phrases, mild retractions, audible wheezing, pulse 100-120.   - SEVERE: Very SOB at rest, speaks in single words, struggling to breathe, sitting hunched forward, retractions, pulse > 120      no 9. OTHER SYMPTOMS: "Do you have any other symptoms?" (e.g., fever, change in sputum)     no 10. SMOKING: "Do you smoke currently?" "Is there anyone that smokes around you?"  (Note: smoking around oxygen is dangerous!)       no  Answer  Assessment - Initial Assessment Questions 1. DESCRIPTION: "Describe how you are feeling."     Weaker than yesterday 2. SEVERITY: "How bad is it?"  "Can you stand and walk?"   - MILD - Feels weak or tired, but does not interfere with work, school or normal activities   - [place] to stand and walk; weakness interferes with work, school, or normal activities   - SEVERE - Unable to stand or walk     moderate 3. ONSET:  "When did the weakness begin?"     Patient was recently hospitalized for anemia 4. CAUSE: "What do you think is causing the weakness?"     Anemia vs medication BP 5. MEDICINES: "Have you recently started a new medicine or had a change in the amount of a medicine?"     New BP medications- although patient states she did not take today due to feeling so bad 6. OTHER SYMPTOMS: "Do you have any other symptoms?" (e.g., chest pain, fever, cough, SOB, vomiting, diarrhea, bleeding, other areas of pain)     no 7. PREGNANCY: "Is there any chance you are pregnant?" "When was your last menstrual period?"     n/a  Protocols used: COPD OXYGEN MONITORING AND HYPOXIA-A-AH, WEAKNESS (GENERALIZED) AND FATIGUE-A-AH

## 2021-03-28 NOTE — Telephone Encounter (Signed)
Spoke with patient at 1:25.  She reports still feeling dizzy and very weak.  Her O2 is up to 92 and heart rate 72.  She was instructed yesterday to decrease Metoprolol to 1/2 a pill twice daily.  She has been doing that since then.   She reported that she had been having blood (but not bright red) in her stools.  While in the hospital she did receive transfusions.

## 2021-03-29 LAB — CBC WITH DIFFERENTIAL/PLATELET
Basophils Absolute: 0 10*3/uL (ref 0.0–0.2)
Basos: 0 %
EOS (ABSOLUTE): 0.3 10*3/uL (ref 0.0–0.4)
Eos: 4 %
Hematocrit: 32.4 % — ABNORMAL LOW (ref 34.0–46.6)
Hemoglobin: 9.4 g/dL — ABNORMAL LOW (ref 11.1–15.9)
Immature Grans (Abs): 0.1 10*3/uL (ref 0.0–0.1)
Immature Granulocytes: 1 %
Lymphocytes Absolute: 1.1 10*3/uL (ref 0.7–3.1)
Lymphs: 13 %
MCH: 21.4 pg — ABNORMAL LOW (ref 26.6–33.0)
MCHC: 29 g/dL — ABNORMAL LOW (ref 31.5–35.7)
MCV: 74 fL — ABNORMAL LOW (ref 79–97)
Monocytes Absolute: 0.9 10*3/uL (ref 0.1–0.9)
Monocytes: 11 %
Neutrophils Absolute: 5.9 10*3/uL (ref 1.4–7.0)
Neutrophils: 71 %
Platelets: 327 10*3/uL (ref 150–450)
RBC: 4.4 x10E6/uL (ref 3.77–5.28)
RDW: 22.4 % — ABNORMAL HIGH (ref 11.7–15.4)
WBC: 8.4 10*3/uL (ref 3.4–10.8)

## 2021-03-29 LAB — RETICULOCYTES: Retic Ct Pct: 0.7 % (ref 0.6–2.6)

## 2021-03-29 LAB — IRON,TIBC AND FERRITIN PANEL
Ferritin: 11 ng/mL — ABNORMAL LOW (ref 15–150)
Iron Saturation: 17 % (ref 15–55)
Iron: 92 ug/dL (ref 27–139)
Total Iron Binding Capacity: 546 ug/dL — ABNORMAL HIGH (ref 250–450)
UIBC: 454 ug/dL — ABNORMAL HIGH (ref 118–369)

## 2021-04-04 ENCOUNTER — Ambulatory Visit (INDEPENDENT_AMBULATORY_CARE_PROVIDER_SITE_OTHER): Payer: Medicare HMO | Admitting: Family Medicine

## 2021-04-04 ENCOUNTER — Encounter: Payer: Self-pay | Admitting: Family Medicine

## 2021-04-04 ENCOUNTER — Other Ambulatory Visit: Payer: Self-pay

## 2021-04-04 VITALS — BP 122/67 | HR 80 | Temp 98.0°F | Resp 16 | Wt 122.0 lb

## 2021-04-04 DIAGNOSIS — R0902 Hypoxemia: Secondary | ICD-10-CM | POA: Diagnosis not present

## 2021-04-04 DIAGNOSIS — I4891 Unspecified atrial fibrillation: Secondary | ICD-10-CM

## 2021-04-04 DIAGNOSIS — R5383 Other fatigue: Secondary | ICD-10-CM

## 2021-04-04 DIAGNOSIS — J449 Chronic obstructive pulmonary disease, unspecified: Secondary | ICD-10-CM

## 2021-04-04 DIAGNOSIS — E440 Moderate protein-calorie malnutrition: Secondary | ICD-10-CM

## 2021-04-04 DIAGNOSIS — E611 Iron deficiency: Secondary | ICD-10-CM

## 2021-04-04 MED ORDER — METOPROLOL SUCCINATE ER 25 MG PO TB24: 12.5000 mg | ORAL_TABLET | Freq: Every day | ORAL | 11 refills | Status: DC

## 2021-04-04 NOTE — Progress Notes (Signed)
Established patient visit   Patient: Jackie Turner   DOB: [birth year]   85 y.o. Female  MRN: [id number] Visit Date: 04/04/2021  Today's healthcare provider: Wilhemena Durie, MD   Chief Complaint  Patient presents with  . Hypertension   Subjective    HPI  Patient comes in today for follow-up of iron deficiency anemia COPD and atrial fibrillation.  She is feeling a little stronger.  Breathing is stable.  She is on oxygen via nasal cannula around-the-clock. Hypertension, follow-up  BP Readings from Last 3 Encounters:  04/04/21 122/67  03/21/21 117/69  03/19/21 128/75   Wt Readings from Last 3 Encounters:  04/04/21 122 lb (55.3 kg)  03/20/21 114 lb 12.8 oz (52.1 kg)  03/19/21 117 lb (53.1 kg)     She was last seen for hypertension 1 weeks ago.  BP at that visit was 117/69. Management since that visit includes decreasing metoprolol to 12.5mg .  She reports poor compliance with treatment. Patient reports that she has not started this.  Use of agents associated with hypertension: none.   Outside blood pressures are not being checked. Symptoms: No chest pain No chest pressure  No palpitations No syncope  No dyspnea No orthopnea  No paroxysmal nocturnal dyspnea No lower extremity edema   Pertinent labs: Lab Results  Component Value Date   CHOL 158 03/01/2021   HDL 51 03/01/2021   LDLCALC 87 03/01/2021   TRIG 112 03/01/2021   CHOLHDL 3.0 06/03/2016   Lab Results  Component Value Date   NA 137 03/20/2021   K 4.8 03/20/2021   CREATININE 0.80 03/20/2021   GFRNONAA >60 03/20/2021   GFRAA >60 09/04/2020   GLUCOSE 195 (H) 03/20/2021     The ASCVD Risk score (Goff DC Jr., et al., 2013) failed to calculate for the following reasons:   The 2013 ASCVD risk score is only valid for ages 62 to 65       Medications: Outpatient Medications Prior to Visit  Medication Sig  . albuterol (PROVENTIL) (2.5 MG/3ML) 0.083% nebulizer solution Take 3 mLs (2.5 mg total)  by nebulization every 6 (six) hours as needed for wheezing or shortness of breath. (Patient not taking: No sig reported)  . calcium carbonate (OSCAL) 1500 (600 Ca) MG TABS tablet Take 1 tablet by mouth 2 (two) times daily with a meal.   . dicyclomine (BENTYL) 10 MG capsule Take 1 capsule (10 mg total) by mouth 3 (three) times daily as needed for spasms.  Marland Kitchen doxepin (SINEQUAN) 10 MG capsule TAKE 3 CAPSULES BY MOUTH AT BEDTIME  . feeding supplement, ENSURE ENLIVE, (ENSURE ENLIVE) LIQD Take 237 mLs by mouth 2 (two) times daily between meals.  . ferrous gluconate (FERGON) 324 MG tablet Take 1 tablet (324 mg total) by mouth daily with breakfast.  . fluticasone (FLONASE) 50 MCG/ACT nasal spray USE 2 SPRAYS IN EACH NOSTRIL ONCE A DAY.  Marland Kitchen Fluticasone-Umeclidin-Vilant (TRELEGY ELLIPTA) 100-62.5-25 MCG/INH AEPB Inhale 1 puff into the lungs daily.  . furosemide (LASIX) 40 MG tablet Take 1 tablet (40 mg total) by mouth daily as needed for edema.  . metoprolol tartrate (LOPRESSOR) 25 MG tablet Take 0.5 tablets (12.5 mg total) by mouth 2 (two) times daily.  Marland Kitchen nystatin (MYCOSTATIN) 100000 UNIT/ML suspension TAKE 5 MLS BY MOUTH 4 TIMES A DAY.  Marland Kitchen oxybutynin (DITROPAN XL) 15 MG 24 hr tablet Take 1 tablet (15 mg total) by mouth at bedtime.  . pentosan polysulfate (ELMIRON) 100 MG capsule Take  1 capsule (100 mg total) by mouth 2 (two) times daily as needed (urinary pain).  . traMADol (ULTRAM) 50 MG tablet TAKE 1 TABLET BY MOUTH EVERY 8 HOURS AS NEEDED FOR PAIN  . Urea 40 % LOTN Apply 1 application topically daily as needed (dry skin).   . VENTOLIN HFA 108 (90 Base) MCG/ACT inhaler INHALE TWO PUFFS INTO THE LUNGS EVERY 4 HOURS AS NEEDED FOR WHEEZING OR SHORTNESS OF BREATH (Patient taking differently: Inhale 2 puffs into the lungs every 4 (four) hours as needed for wheezing or shortness of breath.)  . verapamil (CALAN-SR) 120 MG CR tablet Take 1 tablet (120 mg total) by mouth at bedtime.  . vitamin B-12 1000 MCG tablet  Take 1 tablet (1,000 mcg total) by mouth daily.   No facility-administered medications prior to visit.    Review of Systems  Constitutional: Negative for appetite change, chills, fatigue and fever.  Respiratory: Negative for chest tightness and shortness of breath.   Cardiovascular: Negative for chest pain and palpitations.  Gastrointestinal: Negative for abdominal pain, nausea and vomiting.  Neurological: Negative for dizziness and weakness.        Objective    BP 122/67   Pulse 80   Temp 98 F (36.7 C)   Resp 16   Wt 122 lb (55.3 kg)   SpO2 95% Comment: with 3L O2  BMI 23.83 kg/m  Wt Readings from Last 3 Encounters:  04/04/21 122 lb (55.3 kg)  03/20/21 114 lb 12.8 oz (52.1 kg)  03/19/21 117 lb (53.1 kg)       Physical Exam Vitals reviewed.  HENT:     Head: Normocephalic and atraumatic.     Right Ear: External ear normal.     Left Ear: External ear normal.  Eyes:     General: No scleral icterus. Cardiovascular:     Rate and Rhythm: Normal rate and regular rhythm.     Heart sounds: Normal heart sounds.  Pulmonary:     Breath sounds: Normal breath sounds.  Abdominal:     Palpations: Abdomen is soft.  Musculoskeletal:     Right lower leg: No edema.     Left lower leg: No edema.  Neurological:     General: No focal deficit present.     Mental Status: She is alert and oriented to person, place, and time.  Psychiatric:        Mood and Affect: Mood normal.        Behavior: Behavior normal.        Thought Content: Thought content normal.        Judgment: Judgment normal.       No results found for any visits on 04/04/21.  Assessment & Plan     1. Chronic obstructive pulmonary disease, unspecified COPD type ([place]) On 24-hour oxygen per pulmonary  2. Hypoxia   3. Atrial fibrillation with RVR (HCC) Go back on Toprol at half tablet twice a day.  Rate control important  4. Iron deficiency Continue iron daily.  Follow-up CBC and iron panel  5. Other  fatigue Multifactorial  6. Malnutrition of moderate degree (HCC) Encouraged regular meals.   No follow-ups on file.      I, Wilhemena Durie, MD, have reviewed all documentation for this visit. The documentation on 04/10/21 for the exam, diagnosis, procedures, and orders are all accurate and complete.    Dak Szumski Cranford Mon, MD  [facility] [phone] (phone) [phone] (fax)  [place]

## 2021-04-10 ENCOUNTER — Telehealth: Payer: Self-pay

## 2021-04-10 NOTE — Telephone Encounter (Signed)
Just stop metoprolol for now.  

## 2021-04-10 NOTE — Telephone Encounter (Signed)
Patient was advised and states she will stop taking the medication.

## 2021-04-10 NOTE — Telephone Encounter (Signed)
Copied from [place] [phone]. Topic: General - Other >> Apr 10, 2021  2:33 PM Leward Quan A wrote: Reason for CRM: Patient called in to inform Dr Rosanna Randy  say that the metoprolol succinate (TOPROL-XL) 25 MG 24 hr tablet causing her to be nauseated to the point that she have to take medication for nausea. Ph# [phone]

## 2021-04-10 NOTE — Progress Notes (Signed)
@Patient  ID: Jackie Turner, female    DOB: [birth year], 85 y.o.   MRN: [id number]  Chief Complaint  Patient presents with  . Follow-up    COPD:Sob with walking, breathing well. Would like to discuss another type of oxygen.     Referring provider: Florian Buff*  HPI: 85 year old female, never smoked.  Past medical history significant for COPD Gold D, bronchiectasis, chronic respiratory failure with hypoxia (O2 dependent), A. Fib, lymphedema.  Patient of Dr. Mortimer Fries last seen in office on 09/19/2020.  Maintained on Trelegy and albuterol as needed.   04/11/2021 Patient presents today for 48-month follow-up. She is doing well. She gets out of breath with exertion. She has congestion which she takes Mucinex as needed for with reported relief. No significant cough. She is compliant with Trelegy Ellipta inhaler. She rarely requires albuterol nebulizer. She reports that her right ankles swells, she takes Lasix as needed. Eliquis was on hold for GI bleed. Patient has been on oxygen since July 2021. Continues to benefit from 2 L supplemental oxygen, requested [employer]   Allergies  Allergen Reactions  . Nsaids Other (See Comments)    History of gastric ulcer  . Amoxicillin Nausea Only and Rash  . Ibandronic Acid Other (See Comments)    Muscle weakness - advised not to take  **BONIVA** - drug name  . Actonel  [Risedronate] Other (See Comments)    Gastric ulcers  . Antihistamines, Chlorpheniramine-Type   . Aspirin     Gastric ulcer  . Buspar [Buspirone] Other (See Comments)    Pt does not remember reaction   . Butalbital-Aspirin-Caffeine Other (See Comments)    Other reaction(s): Unknown  . Clarithromycin Other (See Comments)    Bloating  *BIAXIN*  . Codeine   . Gatifloxacin Other (See Comments)  . Hydrocodone-Guaifenesin Nausea Only    CODICLEAR DH STYRUP   . Moxifloxacin   . Oxycodone Other (See Comments)    Dizziness  . Penicillins Other (See Comments)  . Risedronate  Sodium     Gastric ulcer  . Tylenol With Codeine #3  [Acetaminophen-Codeine]     Other reaction(s): Unknown  . Raloxifene Other (See Comments)    Bloating Bloating  *EVISTA*    Immunization History  Administered Date(s) Administered  . Fluad Quad(high Dose 65+) 11/10/2019, 11/15/2020  . Influenza Split 09/25/2009  . Influenza, High Dose Seasonal PF 09/27/2015, 10/21/2016, 09/22/2017, 09/25/2018  . PFIZER(Purple Top)SARS-COV-2 Vaccination 01/06/2020, 01/27/2020, 10/30/2020  . Pneumococcal Conjugate-13 02/09/2015  . Pneumococcal Polysaccharide-23 10/07/2003  . Td 05/14/2007    Past Medical History:  Diagnosis Date  . Allergy   . Asthma   . Hyperlipidemia   . Osteoporosis     Tobacco History: Social History   Tobacco Use  Smoking Status Never Smoker  Smokeless Tobacco Never Used   Counseling given: Not Answered   Outpatient Medications Prior to Visit  Medication Sig Dispense Refill  . albuterol (PROVENTIL) (2.5 MG/3ML) 0.083% nebulizer solution Take 3 mLs (2.5 mg total) by nebulization every 6 (six) hours as needed for wheezing or shortness of breath. 75 mL 12  . calcium carbonate (OSCAL) 1500 (600 Ca) MG TABS tablet Take 1 tablet by mouth 2 (two) times daily with a meal.   0  . dicyclomine (BENTYL) 10 MG capsule Take 1 capsule (10 mg total) by mouth 3 (three) times daily as needed for spasms. 90 capsule 1  . feeding supplement, ENSURE ENLIVE, (ENSURE ENLIVE) LIQD Take 237 mLs by mouth 2 (two) times  daily between meals. 237 mL 12  . ferrous gluconate (FERGON) 324 MG tablet Take 1 tablet (324 mg total) by mouth daily with breakfast. 90 tablet 0  . fluticasone (FLONASE) 50 MCG/ACT nasal spray USE 2 SPRAYS IN EACH NOSTRIL ONCE A DAY. 16 g 1  . Fluticasone-Umeclidin-Vilant (TRELEGY ELLIPTA) 100-62.5-25 MCG/INH AEPB Inhale 1 puff into the lungs daily. 60 each 11  . furosemide (LASIX) 40 MG tablet Take 1 tablet (40 mg total) by mouth daily as needed for edema. 90 tablet 1  .  metoprolol succinate (TOPROL-XL) 25 MG 24 hr tablet Take 0.5 tablets (12.5 mg total) by mouth daily. 30 tablet 11  . nystatin (MYCOSTATIN) 100000 UNIT/ML suspension TAKE 5 MLS BY MOUTH 4 TIMES A DAY. 60 mL 5  . oxybutynin (DITROPAN XL) 15 MG 24 hr tablet Take 1 tablet (15 mg total) by mouth at bedtime. 90 tablet 1  . pentosan polysulfate (ELMIRON) 100 MG capsule Take 1 capsule (100 mg total) by mouth 2 (two) times daily as needed (urinary pain). 180 capsule 1  . Urea 40 % LOTN Apply 1 application topically daily as needed (dry skin).   0  . VENTOLIN HFA 108 (90 Base) MCG/ACT inhaler INHALE TWO PUFFS INTO THE LUNGS EVERY 4 HOURS AS NEEDED FOR WHEEZING OR SHORTNESS OF BREATH (Patient taking differently: Inhale 2 puffs into the lungs every 4 (four) hours as needed for wheezing or shortness of breath.) 18 g 5  . verapamil (CALAN-SR) 120 MG CR tablet Take 1 tablet (120 mg total) by mouth at bedtime. 90 tablet 1  . vitamin B-12 1000 MCG tablet Take 1 tablet (1,000 mcg total) by mouth daily. 30 tablet 1  . doxepin (SINEQUAN) 10 MG capsule TAKE 3 CAPSULES BY MOUTH AT BEDTIME 270 capsule 0  . traMADol (ULTRAM) 50 MG tablet TAKE 1 TABLET BY MOUTH EVERY 8 HOURS AS NEEDED FOR PAIN 90 tablet 5   No facility-administered medications prior to visit.    Review of Systems  Review of Systems  Constitutional: Negative.   HENT: Positive for congestion.   Respiratory: Positive for shortness of breath. Negative for cough.   Cardiovascular: Negative.     Physical Exam  BP 118/82 (BP Location: Left Arm, Patient Position: Sitting, Cuff Size: Normal)   Pulse (!) 104   Ht 4\' 9"  (1.448 m)   Wt 120 lb 3.2 oz (54.5 kg)   SpO2 95%   BMI 26.01 kg/m  Physical Exam Constitutional:      Appearance: Normal appearance.  HENT:     Head: Normocephalic and atraumatic.     Mouth/Throat:     Comments: Deferred d/t masking Cardiovascular:     Rate and Rhythm: Normal rate and regular rhythm.     Comments: No  edema Pulmonary:     Breath sounds: Wheezing present.  Skin:    General: Skin is warm and dry.  Neurological:     General: No focal deficit present.     Mental Status: She is alert and oriented to person, place, and time. Mental status is at baseline.  Psychiatric:        Mood and Affect: Mood normal.        Behavior: Behavior normal.        Thought Content: Thought content normal.        Judgment: Judgment normal.      Lab Results:  CBC    Component Value Date/Time   WBC 8.4 03/28/2021 1455   WBC 6.6 03/20/2021 0413  RBC 4.40 03/28/2021 1455   RBC 4.27 03/20/2021 0413   HGB 9.4 (L) 03/28/2021 1455   HCT 32.4 (L) 03/28/2021 1455   PLT 327 03/28/2021 1455   MCV 74 (L) 03/28/2021 1455   MCH 21.4 (L) 03/28/2021 1455   MCH 20.8 (L) 03/20/2021 0413   MCHC 29.0 (L) 03/28/2021 1455   MCHC 29.0 (L) 03/20/2021 0413   RDW 22.4 (H) 03/28/2021 1455   LYMPHSABS 1.1 03/28/2021 1455   MONOABS 0.7 03/19/2021 1545   EOSABS 0.3 03/28/2021 1455   BASOSABS 0.0 03/28/2021 1455    BMET    Component Value Date/Time   NA 137 03/20/2021 0413   NA 143 03/01/2021 1627   K 4.8 03/20/2021 0413   CL 98 03/20/2021 0413   CO2 29 03/20/2021 0413   GLUCOSE 195 (H) 03/20/2021 0413   BUN 31 (H) 03/20/2021 0413   BUN 23 03/01/2021 1627   CREATININE 0.80 03/20/2021 0413   CALCIUM 9.2 03/20/2021 0413   GFRNONAA >60 03/20/2021 0413   GFRAA >60 09/04/2020 1606    BNP    Component Value Date/Time   BNP 191.5 (H) 03/19/2021 1945    ProBNP No results found for: PROBNP  Imaging: No results found.   Assessment & Plan:   COPD (chronic obstructive pulmonary disease) (HCC) - Stable interval; No recent exacerbations. Rare SABA use.  - Continue Trelegy 136mcg one puff daily  - Use Albuterol rescue inhaler OR nebulizer as needed every 6 hours for breakthrough shortness of breath/wheezing - Encourage patient use incentive spirometer 5-10 deep breaths 3-4 times a day  - Follow-up 3-6 months  with Dr. Mortimer Fries or sooner if needed   Chronic respiratory failure with hypoxia ([place]) - Continues to benefit from 2 L supplemental oxygen - Referring to Pulmonary rehab re: chronic respiratory failure  - Needs Best fit for POC with [place], NP 05/02/2021

## 2021-04-11 ENCOUNTER — Other Ambulatory Visit: Payer: Self-pay

## 2021-04-11 ENCOUNTER — Ambulatory Visit: Payer: Medicare HMO | Admitting: Primary Care

## 2021-04-11 ENCOUNTER — Encounter: Payer: Self-pay | Admitting: Primary Care

## 2021-04-11 VITALS — BP 118/82 | HR 104 | Ht <= 58 in | Wt 120.2 lb

## 2021-04-11 DIAGNOSIS — J449 Chronic obstructive pulmonary disease, unspecified: Secondary | ICD-10-CM | POA: Diagnosis not present

## 2021-04-11 DIAGNOSIS — J9611 Chronic respiratory failure with hypoxia: Secondary | ICD-10-CM

## 2021-04-11 NOTE — Patient Instructions (Addendum)
Recommendations: Continue Trelegy 100- take one puff daily in the morning (rinse mouth after use) Use Albuterol rescue inhaler OR nebulizer as needed every 6 hours for breakthrough shortness of breath/wheezing Use incentive spirometer 5-10 deep breaths 3-4 times a day   Referral: Pulmonary rehab re: chronic respiratory failure   Orders: Incentive spirometer  Best fit for POC with [employer]   Follow-up: 3-6 months with Dr. Mortimer Fries or sooner if needed

## 2021-04-19 ENCOUNTER — Other Ambulatory Visit: Payer: Self-pay | Admitting: Family Medicine

## 2021-04-19 DIAGNOSIS — F324 Major depressive disorder, single episode, in partial remission: Secondary | ICD-10-CM

## 2021-04-19 NOTE — Telephone Encounter (Signed)
Future visit in 1 month  

## 2021-04-20 ENCOUNTER — Other Ambulatory Visit: Payer: Self-pay | Admitting: Family Medicine

## 2021-04-20 DIAGNOSIS — F324 Major depressive disorder, single episode, in partial remission: Secondary | ICD-10-CM

## 2021-04-20 NOTE — Telephone Encounter (Signed)
Requested Prescriptions  Pending Prescriptions Disp Refills  . doxepin (SINEQUAN) 10 MG capsule [Pharmacy Med Name: DOXEPIN HCL 10 MG CAP] 270 capsule 0    Sig: TAKE 3 CAPSULES BY MOUTH AT BEDTIME     Psychiatry:  Antidepressants - Heterocyclics (TCAs) Passed - 04/20/2021 10:34 AM      Passed - Completed PHQ-2 or PHQ-9 in the last 360 days      Passed - Valid encounter within last 6 months    Recent Outpatient Visits          2 weeks ago Chronic obstructive pulmonary disease, unspecified COPD type [facility])   [facility] Jerrol Banana., MD   3 weeks ago Hospital discharge follow-up   [facility] Just, Laurita Quint, FNP   1 month ago Atrial fibrillation with RVR [facility])   [place], [place]   5 months ago Iron deficiency anemia secondary to inadequate dietary iron intake   [place], [place]   5 months ago Need for influenza vaccination   [place], Clearnce Sorrel, [place]      Future Appointments            In 1 month Jerrol Banana., MD [facility], [place]   In 1 month Bacigalupo, Dionne Bucy, MD [facility], [place]

## 2021-04-23 ENCOUNTER — Other Ambulatory Visit: Payer: Self-pay | Admitting: Physician Assistant

## 2021-04-23 DIAGNOSIS — M5416 Radiculopathy, lumbar region: Secondary | ICD-10-CM

## 2021-04-23 DIAGNOSIS — M48061 Spinal stenosis, lumbar region without neurogenic claudication: Secondary | ICD-10-CM

## 2021-04-23 DIAGNOSIS — S22000D Wedge compression fracture of unspecified thoracic vertebra, subsequent encounter for fracture with routine healing: Secondary | ICD-10-CM

## 2021-05-02 NOTE — Assessment & Plan Note (Addendum)
-   Stable interval; No recent exacerbations. Rare SABA use.  - Continue Trelegy 135mcg one puff daily  - Use Albuterol rescue inhaler OR nebulizer as needed every 6 hours for breakthrough shortness of breath/wheezing - Encourage patient use incentive spirometer 5-10 deep breaths 3-4 times a day  - Follow-up 3-6 months with Dr. Mortimer Fries or sooner if needed

## 2021-05-02 NOTE — Assessment & Plan Note (Addendum)
-   Continues to benefit from 2 L supplemental oxygen - Referring to Pulmonary rehab re: chronic respiratory failure  - Needs Best fit for POC with [employer]

## 2021-05-26 ENCOUNTER — Other Ambulatory Visit: Payer: Self-pay | Admitting: Physician Assistant

## 2021-05-26 DIAGNOSIS — I4891 Unspecified atrial fibrillation: Secondary | ICD-10-CM

## 2021-05-31 ENCOUNTER — Other Ambulatory Visit: Payer: Self-pay | Admitting: Physician Assistant

## 2021-06-04 ENCOUNTER — Ambulatory Visit: Payer: Self-pay | Admitting: Family Medicine

## 2021-06-05 ENCOUNTER — Other Ambulatory Visit: Payer: Self-pay | Admitting: Family Medicine

## 2021-06-05 ENCOUNTER — Encounter: Payer: Self-pay | Admitting: Family Medicine

## 2021-06-05 ENCOUNTER — Other Ambulatory Visit: Payer: Self-pay

## 2021-06-05 ENCOUNTER — Ambulatory Visit (INDEPENDENT_AMBULATORY_CARE_PROVIDER_SITE_OTHER): Payer: Medicare HMO | Admitting: Family Medicine

## 2021-06-05 VITALS — BP 126/76 | HR 102 | Temp 98.5°F | Resp 16 | Wt 118.8 lb

## 2021-06-05 DIAGNOSIS — I4891 Unspecified atrial fibrillation: Secondary | ICD-10-CM

## 2021-06-05 DIAGNOSIS — I10 Essential (primary) hypertension: Secondary | ICD-10-CM | POA: Insufficient documentation

## 2021-06-05 DIAGNOSIS — E611 Iron deficiency: Secondary | ICD-10-CM

## 2021-06-05 DIAGNOSIS — M48061 Spinal stenosis, lumbar region without neurogenic claudication: Secondary | ICD-10-CM

## 2021-06-05 DIAGNOSIS — S22000D Wedge compression fracture of unspecified thoracic vertebra, subsequent encounter for fracture with routine healing: Secondary | ICD-10-CM

## 2021-06-05 DIAGNOSIS — D62 Acute posthemorrhagic anemia: Secondary | ICD-10-CM

## 2021-06-05 DIAGNOSIS — M5416 Radiculopathy, lumbar region: Secondary | ICD-10-CM

## 2021-06-05 NOTE — Assessment & Plan Note (Signed)
Followed by cardiology Initially with tachycardia, but improved by end of visit Will not change meds today as this could be related to anemia as below Upcoming f/u with [facility]

## 2021-06-05 NOTE — Assessment & Plan Note (Signed)
As above - recheck and f/u with Heme

## 2021-06-05 NOTE — Assessment & Plan Note (Signed)
Longstanding Repeat CBC and iron panel today as she is feeling worse again Will advised her to call for f/u with Heme Treatment pending results

## 2021-06-05 NOTE — Patient Instructions (Addendum)
Dr Rogue Bussing [phone]  Dr Dahlia Byes (surgeon) [phone]   [employer] medicine (25th ed., pp. [phone]). [place], [place]: Elsevier.">  Anemia  Anemia is a condition in which there is not enough red blood cells or hemoglobin in the blood. Hemoglobin is a substance in red blood cells thatcarries oxygen. When you do not have enough red blood cells or hemoglobin (are anemic), your body cannot get enough oxygen and your organs may not work properly. Asa result, you may feel very tired or have other problems. What are the causes? Common causes of anemia include: Excessive bleeding. Anemia can be caused by excessive bleeding inside or outside the body, including bleeding from the intestines or from heavy menstrual periods in females. Poor nutrition. Long-lasting (chronic) kidney, thyroid, and liver disease. Bone marrow disorders, spleen problems, and blood disorders. Cancer and treatments for cancer. HIV (human immunodeficiency virus) and AIDS (acquired immunodeficiency syndrome). Infections, medicines, and autoimmune disorders that destroy red blood cells. What are the signs or symptoms? Symptoms of this condition include: Minor weakness. Dizziness. Headache, or difficulties concentrating and sleeping. Heartbeats that feel irregular or faster than normal (palpitations). Shortness of breath, especially with exercise. Pale skin, lips, and nails, or cold hands and feet. Indigestion and nausea. Symptoms may occur suddenly or develop slowly. If your anemia is mild, you maynot have symptoms. How is this diagnosed? This condition is diagnosed based on blood tests, your medical history, and a physical exam. In some cases, a test may be needed in which cells are removed from the soft tissue inside of a bone and looked at under a microscope (bone marrow biopsy). Your health care provider may also check your stool (feces) for blood and may do additional testing to look for the cause of  yourbleeding. Other tests may include: Imaging tests, such as a CT scan or MRI. A procedure to see inside your esophagus and stomach (endoscopy). A procedure to see inside your colon and rectum (colonoscopy). How is this treated? Treatment for this condition depends on the cause. If you continue to lose a lot of blood, you may need to be treated at a hospital. Treatment may include: Taking supplements of iron, vitamin S82, or folic acid. Taking a hormone medicine (erythropoietin) that can help to stimulate red blood cell growth. Having a blood transfusion. This may be needed if you lose a lot of blood. Making changes to your diet. Having surgery to remove your spleen. Follow these instructions at home: Take over-the-counter and prescription medicines only as told by your health care provider. Take supplements only as told by your health care provider. Follow any diet instructions that you were given by your health care provider. Keep all follow-up visits as told by your health care provider. This is important. Contact a health care provider if: You develop new bleeding anywhere in the body. Get help right away if: You are very weak. You are short of breath. You have pain in your abdomen or chest. You are dizzy or feel faint. You have trouble concentrating. You have bloody stools, black stools, or tarry stools. You vomit repeatedly or you vomit up blood. These symptoms may represent a serious problem that is an emergency. Do not wait to see if the symptoms will go away. Get medical help right away. Call your local emergency services (911 in the [place] Do not drive yourself to the hospital. Summary Anemia is a condition in which you do not have enough red blood cells or enough of a substance in  your red blood cells that carries oxygen (hemoglobin). Symptoms may occur suddenly or develop slowly. If your anemia is mild, you may not have symptoms. This condition is diagnosed with blood  tests, a medical history, and a physical exam. Other tests may be needed. Treatment for this condition depends on the cause of the anemia. This information is not intended to replace advice given to you by your health care provider. Make sure you discuss any questions you have with your healthcare provider. Document Revised: 11/09/2019 Document Reviewed: 11/09/2019 Elsevier Patient Education  2022 [employer].

## 2021-06-05 NOTE — Progress Notes (Signed)
Established patient visit   Patient: Jackie Turner   DOB: 09-19-1936   85 y.o. Female  MRN: [id number] Visit Date: 06/05/2021  Today's healthcare provider: Lavon Paganini, MD   Chief Complaint  Patient presents with   Atrial Fibrillation    Subjective    Atrial Fibrillation Symptoms are negative for chest pain, dizziness, palpitations, shortness of breath and weakness. Past medical history includes atrial fibrillation.    Anemia For about 1 wk she has been feeling sick and having episodes of dry-heaving. Since she was diagnosed with anemia last year she has had low hemoglobin counts 2x. She is unsure if her hemoglobin counts have dropped again. She denies taking eliquis because of her low blood count.   Follow up for atrial fibrillation with RVR  The patient was last seen for this 3 months ago. Changes made at last visit include patient to take verapamil 120mg  daily instead of 60mg  daily.  She reports excellent compliance with treatment. She feels that condition is Unchanged. She is not having side effects. She is on a constant flow of oxygen and it is heavy for her to carry around   -----------------------------------------------------------------------------------------   Patient Active Problem List   Diagnosis Date Noted   Primary hypertension 06/05/2021   Atrial fibrillation with rapid ventricular response ([place]) 03/20/2021   Microcytic anemia 03/19/2021   Iron deficiency 03/19/2021   A-fib ([place]) 03/19/2021   Memory loss 11/21/2020   Polyp of colon    Acute blood loss anemia 08/09/2020   GI bleeding 08/04/2020   Depression, major, single episode, in partial remission ([place]) 06/22/2020   Atrial fibrillation with RVR ([place]) 06/13/2020   Chronic respiratory failure with hypoxia ([place]) 06/13/2020   Right hand pain 06/13/2020   Fall at home, initial encounter 06/13/2020   COPD (chronic obstructive pulmonary disease) ([place]) 06/13/2020   Bronchiectasis  without complication ([place]) 34/28/7681   Lymphedema 05/17/2019   Chronic venous insufficiency 05/17/2019   Swelling of limb 02/10/2018   Varicose veins of leg with swelling, bilateral 02/10/2018   Squamous cell carcinoma 09/22/2017   Hypoxia 09/27/2015   Abnormal CXR 09/27/2015   Asthma with allergic rhinitis 08/31/2015   Back ache 08/31/2015   Chronic interstitial cystitis 08/31/2015   Grief reaction 08/31/2015   Hyperlipidemia 06/26/2015   Migraine 06/26/2015   Asthma 06/26/2015   IBS (irritable bowel syndrome) 06/26/2015   Osteoporosis 06/26/2015   Fatigue 06/26/2015   Lumbar radiculopathy 04/27/2013   Urinary urgency 01/08/2012   Frequency of urination 01/08/2012   Social History   Tobacco Use   Smoking status: Never   Smokeless tobacco: Never  Vaping Use   Vaping Use: Never used  Substance Use Topics   Alcohol use: No   Drug use: No   Allergies  Allergen Reactions   Nsaids Other (See Comments)    History of gastric ulcer   Amoxicillin Nausea Only and Rash   Ibandronic Acid Other (See Comments)    Muscle weakness - advised not to take  **BONIVA** - drug name   Actonel  [Risedronate] Other (See Comments)    Gastric ulcers   Antihistamines, Chlorpheniramine-Type    Aspirin     Gastric ulcer   Buspar [Buspirone] Other (See Comments)    Pt does not remember reaction    Butalbital-Aspirin-Caffeine Other (See Comments)    Other reaction(s): Unknown   Clarithromycin Other (See Comments)    Bloating  *BIAXIN*   Codeine    Gatifloxacin Other (See Comments)  Hydrocodone-Guaifenesin Nausea Only    CODICLEAR DH STYRUP    Moxifloxacin    Oxycodone Other (See Comments)    Dizziness   Penicillins Other (See Comments)   Risedronate Sodium     Gastric ulcer   Tylenol With Codeine #3  [Acetaminophen-Codeine]     Other reaction(s): Unknown   Raloxifene Other (See Comments)    Bloating Bloating  *EVISTA*     Other (See Comments)                                                                                                                            Medications: Outpatient Medications Prior to Visit  Medication Sig   albuterol (PROVENTIL) (2.5 MG/3ML) 0.083% nebulizer solution Take 3 mLs (2.5 mg total) by nebulization every 6 (six) hours as needed for wheezing or shortness of breath.   dicyclomine (BENTYL) 10 MG capsule Take 1 capsule (10 mg total) by mouth 3 (three) times daily as needed for spasms.   doxepin (SINEQUAN) 10 MG capsule TAKE 3 CAPSULES BY MOUTH AT BEDTIME   feeding supplement, ENSURE ENLIVE, (ENSURE ENLIVE) LIQD Take 237 mLs by mouth 2 (two) times daily between meals.   ferrous gluconate (FERGON) 324 MG tablet Take 1 tablet (324 mg total) by mouth daily with breakfast.   fluticasone (FLONASE) 50 MCG/ACT nasal spray Place 2 sprays into both nostrils daily.   Fluticasone-Umeclidin-Vilant (TRELEGY ELLIPTA) 100-62.5-25 MCG/INH AEPB Inhale 1 puff into the lungs daily.   furosemide (LASIX) 40 MG tablet Take 1 tablet (40 mg total) by mouth daily as needed for edema.   nystatin (MYCOSTATIN) 100000 UNIT/ML suspension TAKE 5 MLS BY MOUTH 4 TIMES A DAY.   oxybutynin (DITROPAN XL) 15 MG 24 hr tablet Take 1 tablet (15 mg total) by mouth at bedtime.   OXYGEN Inhale 2 L into the lungs daily.   pentosan polysulfate (ELMIRON) 100 MG capsule Take 1 capsule (100 mg total) by mouth 2 (two) times daily as needed (urinary pain).   traMADol (ULTRAM) 50 MG tablet TAKE 1 TABLET BY MOUTH EVERY 8 HOURS AS NEEDED FOR PAIN   Urea 40 % LOTN Apply 1 application topically daily as needed (dry skin).    VENTOLIN HFA 108 (90 Base) MCG/ACT inhaler INHALE TWO PUFFS INTO THE LUNGS EVERY 4 HOURS AS NEEDED FOR WHEEZING OR SHORTNESS OF BREATH (Patient taking differently: Inhale 2 puffs into the lungs every 4 (four) hours as needed for wheezing or shortness of breath.)   verapamil (CALAN-SR) 120 MG CR tablet TAKE 1 TABLET BY MOUTH AT BEDTIME    [DISCONTINUED] calcium carbonate (OSCAL) 1500 (600 Ca) MG TABS tablet Take 1 tablet by mouth 2 (two) times daily with a meal.    [DISCONTINUED] metoprolol succinate (TOPROL-XL) 25 MG 24 hr tablet Take 0.5 tablets (12.5 mg total) by mouth daily. (Patient not taking: Reported on 06/05/2021)   [DISCONTINUED] vitamin B-12 1000 MCG tablet Take 1 tablet (1,000 mcg total) by mouth daily.  No facility-administered medications prior to visit.    Review of Systems  Constitutional:  Negative for activity change, appetite change, chills, fatigue and fever.  HENT:  Negative for ear pain, nosebleeds, sinus pressure, sinus pain and sore throat.   Eyes:  Negative for pain.  Respiratory:  Positive for wheezing. Negative for apnea, cough, chest tightness and shortness of breath.   Cardiovascular:  Negative for chest pain, palpitations and leg swelling.  Gastrointestinal:  Negative for abdominal pain, blood in stool, constipation, diarrhea, nausea and vomiting.  Genitourinary:  Negative for dysuria, flank pain, frequency, pelvic pain and urgency.  Musculoskeletal:  Negative for back pain, myalgias and neck pain.  Neurological:  Negative for dizziness, seizures, syncope, weakness, light-headedness, numbness and headaches.       Objective    BP 126/76 (BP Location: Left Arm, Patient Position: Sitting, Cuff Size: Normal)   Pulse (!) 102   Temp 98.5 F (36.9 C) (Oral)   Resp 16   Wt 118 lb 12.8 oz (53.9 kg)   SpO2 99%   BMI 25.71 kg/m  BP Readings from Last 3 Encounters:  06/05/21 126/76  04/11/21 118/82  04/04/21 122/67   Wt Readings from Last 3 Encounters:  06/05/21 118 lb 12.8 oz (53.9 kg)  04/11/21 120 lb 3.2 oz (54.5 kg)  04/04/21 122 lb (55.3 kg)     Physical Exam Vitals reviewed.  Constitutional:      General: She is not in acute distress.    Appearance: Normal appearance. She is well-developed. She is not diaphoretic.  HENT:     Head: Normocephalic and atraumatic.  Eyes:      General: No scleral icterus.    Conjunctiva/sclera: Conjunctivae normal.  Neck:     Thyroid: No thyromegaly.  Cardiovascular:     Rate and Rhythm: Tachycardia present. Rhythm irregularly irregular.     Pulses: Normal pulses.     Heart sounds: Normal heart sounds. No murmur heard. Pulmonary:     Effort: Pulmonary effort is normal. No respiratory distress.     Breath sounds: Wheezing present. No rhonchi or rales.  Musculoskeletal:     Cervical back: Neck supple.     Right lower leg: No edema.     Left lower leg: No edema.  Lymphadenopathy:     Cervical: No cervical adenopathy.  Skin:    General: Skin is warm and dry.     Findings: No rash.  Neurological:     Mental Status: She is alert and oriented to person, place, and time. Mental status is at baseline.  Psychiatric:        Mood and Affect: Mood normal.        Behavior: Behavior normal.      No results found for any visits on 06/05/21.  Assessment & Plan     Problem List Items Addressed This Visit       Cardiovascular and Mediastinum   Atrial fibrillation with RVR ([place]) - Primary    Followed by cardiology Initially with tachycardia, but improved by end of visit Will not change meds today as this could be related to anemia as below Upcoming f/u with [facility]         Other   Acute blood loss anemia    Longstanding Repeat CBC and iron panel today as she is feeling worse again Will advised her to call for f/u with Heme Treatment pending results       Relevant Orders   CBC w/Diff/Platelet   Fe+TIBC+Fer  Iron deficiency    As above - recheck and f/u with Heme       Relevant Orders   CBC w/Diff/Platelet   Fe+TIBC+Fer     Return in about 3 months (around 09/05/2021) for call back in 1 month to schedule with new PCP, chronic disease f/u.       I,Essence Turner,acting as a Education administrator for Lavon Paganini, MD.,have documented all relevant documentation on the behalf of Lavon Paganini, MD,as directed by   Lavon Paganini, MD while in the presence of Lavon Paganini, MD.   I, Lavon Paganini, MD, have reviewed all documentation for this visit. The documentation on 06/05/21 for the exam, diagnosis, procedures, and orders are all accurate and complete.   Sidharth Leverette, Dionne Bucy, MD, MPH [facility]

## 2021-06-06 LAB — CBC WITH DIFFERENTIAL/PLATELET
Basophils Absolute: 0.1 10*3/uL (ref 0.0–0.2)
Basos: 1 %
EOS (ABSOLUTE): 0.2 10*3/uL (ref 0.0–0.4)
Eos: 3 %
Hematocrit: 35.5 % (ref 34.0–46.6)
Hemoglobin: 11 g/dL — ABNORMAL LOW (ref 11.1–15.9)
Immature Grans (Abs): 0 10*3/uL (ref 0.0–0.1)
Immature Granulocytes: 1 %
Lymphocytes Absolute: 0.9 10*3/uL (ref 0.7–3.1)
Lymphs: 14 %
MCH: 24.8 pg — ABNORMAL LOW (ref 26.6–33.0)
MCHC: 31 g/dL — ABNORMAL LOW (ref 31.5–35.7)
MCV: 80 fL (ref 79–97)
Monocytes Absolute: 0.5 10*3/uL (ref 0.1–0.9)
Monocytes: 8 %
Neutrophils Absolute: 4.9 10*3/uL (ref 1.4–7.0)
Neutrophils: 73 %
Platelets: 308 10*3/uL (ref 150–450)
RBC: 4.43 x10E6/uL (ref 3.77–5.28)
RDW: 21 % — ABNORMAL HIGH (ref 11.7–15.4)
WBC: 6.7 10*3/uL (ref 3.4–10.8)

## 2021-06-06 LAB — IRON,TIBC AND FERRITIN PANEL
Ferritin: 10 ng/mL — ABNORMAL LOW (ref 15–150)
Iron Saturation: 7 % — CL (ref 15–55)
Iron: 38 ug/dL (ref 27–139)
Total Iron Binding Capacity: 519 ug/dL — ABNORMAL HIGH (ref 250–450)
UIBC: 481 ug/dL — ABNORMAL HIGH (ref 118–369)

## 2021-06-08 ENCOUNTER — Ambulatory Visit: Payer: Self-pay

## 2021-06-08 ENCOUNTER — Telehealth: Payer: Self-pay

## 2021-06-08 DIAGNOSIS — I4891 Unspecified atrial fibrillation: Secondary | ICD-10-CM

## 2021-06-08 NOTE — Telephone Encounter (Signed)
  Care Management   Follow Up Note   06/08/2021 Name: Jackie Turner MRN: [id number] DOB: [birth year]   Primary Care Provider: Virginia Crews, MD Reason for referral : Chronic Care Management   An unsuccessful outreach was attempted today. [name] is currently enrolled in the Chronic Care Management program.   Follow Up Plan:  A HIPAA compliant voice message was left today requesting a return call.    [name] [phone]

## 2021-06-08 NOTE — Chronic Care Management (AMB) (Signed)
  Chronic Care Management   Note  06/08/2021 Name: Estefania Kamiya MRN: [id number] DOB: 03-23-1936   Mrs. Wing returned call. Reports doing well today. Anticipates being busy with several engagements over the next week. Confirmed availability for care management outreach on 06/25/21. Agreed to call if needed prior to the scheduled outreach.     PLAN Will follow up as requested on 06/25/21.    [name] [phone]

## 2021-06-25 ENCOUNTER — Ambulatory Visit: Payer: Self-pay

## 2021-06-25 ENCOUNTER — Telehealth: Payer: Self-pay

## 2021-06-25 DIAGNOSIS — I4891 Unspecified atrial fibrillation: Secondary | ICD-10-CM

## 2021-06-25 NOTE — Chronic Care Management (AMB) (Signed)
  Chronic Care Management      06/25/2021 Name: Jackie Turner MRN: [id number] DOB: Jun 08, 1936  Primary Care Provider: Virginia Crews, MD Reason for referral : Chronic Care Management  Jackie Turner is a 85 y.o. year old female who is a primary care patient of Brita Romp, Dionne Bucy, MD. She was not available to complete care management outreach as scheduled today. Will call when she is available.    PLAN Anticipate outreach within the next month.    [name] [phone]

## 2021-06-28 ENCOUNTER — Emergency Department: Payer: Medicare HMO

## 2021-06-28 ENCOUNTER — Other Ambulatory Visit: Payer: Self-pay

## 2021-06-28 ENCOUNTER — Ambulatory Visit: Payer: Self-pay | Admitting: *Deleted

## 2021-06-28 ENCOUNTER — Emergency Department
Admission: EM | Admit: 2021-06-28 | Discharge: 2021-06-28 | Disposition: A | Discharge status: Home / Self Care | Payer: Medicare HMO | Attending: Emergency Medicine | Admitting: Emergency Medicine

## 2021-06-28 ENCOUNTER — Encounter: Payer: Self-pay | Admitting: Emergency Medicine

## 2021-06-28 DIAGNOSIS — R531 Weakness: Secondary | ICD-10-CM | POA: Diagnosis not present

## 2021-06-28 DIAGNOSIS — Z79899 Other long term (current) drug therapy: Secondary | ICD-10-CM | POA: Insufficient documentation

## 2021-06-28 DIAGNOSIS — J449 Chronic obstructive pulmonary disease, unspecified: Secondary | ICD-10-CM | POA: Insufficient documentation

## 2021-06-28 DIAGNOSIS — Z7951 Long term (current) use of inhaled steroids: Secondary | ICD-10-CM | POA: Insufficient documentation

## 2021-06-28 DIAGNOSIS — I10 Essential (primary) hypertension: Secondary | ICD-10-CM | POA: Insufficient documentation

## 2021-06-28 DIAGNOSIS — R0602 Shortness of breath: Secondary | ICD-10-CM | POA: Diagnosis not present

## 2021-06-28 DIAGNOSIS — Z20822 Contact with and (suspected) exposure to covid-19: Secondary | ICD-10-CM | POA: Insufficient documentation

## 2021-06-28 DIAGNOSIS — Z85828 Personal history of other malignant neoplasm of skin: Secondary | ICD-10-CM | POA: Diagnosis not present

## 2021-06-28 DIAGNOSIS — J45909 Unspecified asthma, uncomplicated: Secondary | ICD-10-CM | POA: Diagnosis not present

## 2021-06-28 DIAGNOSIS — R110 Nausea: Secondary | ICD-10-CM

## 2021-06-28 LAB — TROPONIN I (HIGH SENSITIVITY): Troponin I (High Sensitivity): 9 ng/L (ref <18)

## 2021-06-28 LAB — COMPREHENSIVE METABOLIC PANEL
ALT: 21 U/L (ref 0–44)
AST: 24 U/L (ref 15–41)
Albumin: 4.1 g/dL (ref 3.5–5.0)
Alkaline Phosphatase: 89 U/L (ref 38–126)
Anion gap: 10 (ref 5–15)
BUN: 18 mg/dL (ref 8–23)
CO2: 33 mmol/L — ABNORMAL HIGH (ref 22–32)
Calcium: 9.3 mg/dL (ref 8.9–10.3)
Chloride: 98 mmol/L (ref 98–111)
Creatinine, Ser: 0.61 mg/dL (ref 0.44–1.00)
GFR, Estimated: >60 mL/min (ref >60)
Glucose, Bld: 100 mg/dL — ABNORMAL HIGH (ref 70–99)
Potassium: 4.5 mmol/L (ref 3.5–5.1)
Sodium: 141 mmol/L (ref 135–145)
Total Bilirubin: 0.5 mg/dL (ref 0.3–1.2)
Total Protein: 6.9 g/dL (ref 6.5–8.1)

## 2021-06-28 LAB — LACTIC ACID, PLASMA: Lactic Acid, Venous: 0.9 mmol/L (ref 0.5–1.9)

## 2021-06-28 LAB — RESP PANEL BY RT-PCR (FLU A&B, COVID) ARPGX2
Influenza A by PCR: NEGATIVE
Influenza B by PCR: NEGATIVE
SARS Coronavirus 2 by RT PCR: NEGATIVE

## 2021-06-28 LAB — CBC
HCT: 36.7 % (ref 36.0–46.0)
Hemoglobin: 11.3 g/dL — ABNORMAL LOW (ref 12.0–15.0)
MCH: 26.2 pg (ref 26.0–34.0)
MCHC: 30.8 g/dL (ref 30.0–36.0)
MCV: 85.2 fL (ref 80.0–100.0)
Platelets: 274 10*3/uL (ref 150–400)
RBC: 4.31 MIL/uL (ref 3.87–5.11)
RDW: 19.3 % — ABNORMAL HIGH (ref 11.5–15.5)
WBC: 8.3 10*3/uL (ref 4.0–10.5)
nRBC: 0 % (ref 0.0–0.2)

## 2021-06-28 LAB — TYPE AND SCREEN
ABO/RH(D): A POS
Antibody Screen: NEGATIVE

## 2021-06-28 LAB — BRAIN NATRIURETIC PEPTIDE: B Natriuretic Peptide: 339.2 pg/mL — ABNORMAL HIGH (ref 0.0–100.0)

## 2021-06-28 NOTE — ED Notes (Signed)
VO Dr. Charna Archer, pt. Does not need second timed troponin, or hemacult.

## 2021-06-28 NOTE — ED Notes (Signed)
Pt. In bed, waiting for ride, still on facility 02, 2L [place].

## 2021-06-28 NOTE — ED Provider Notes (Signed)
[facility] Emergency Department Provider Note  ____________________________________________   Event Date/Time   First MD Initiated Contact with Patient 06/28/21 1834     (approximate)  I have reviewed the triage vital signs and the nursing notes.   HISTORY  Chief Complaint Rectal Bleeding and Abnormal Lab    HPI Jackie Turner is a 85 y.o. female presents emergency department with shortness of breath and weakness.  Patient is attributing this to low hemoglobin.  States her bowels have been moving more frequently so she felt like she had lost more blood.  Patient states she does have a history of A. fib.  She has had some dry heaves but no actual vomiting.  She denies chest pain/shortness of breath at this time, no abdominal pain.  Pt also states she takes lasix for swelling in her legs  Past Medical History:  Diagnosis Date   Allergy    Asthma    Hyperlipidemia    Osteoporosis     Patient Active Problem List   Diagnosis Date Noted   Primary hypertension 06/05/2021   Atrial fibrillation with rapid ventricular response ([place]) 03/20/2021   Microcytic anemia 03/19/2021   Iron deficiency 03/19/2021   A-fib ([place]) 03/19/2021   Memory loss 11/21/2020   Polyp of colon    Acute blood loss anemia 08/09/2020   GI bleeding 08/04/2020   Depression, major, single episode, in partial remission ([place]) 06/22/2020   Atrial fibrillation with RVR ([place]) 06/13/2020   Chronic respiratory failure with hypoxia ([place]) 06/13/2020   Right hand pain 06/13/2020   Fall at home, initial encounter 06/13/2020   COPD (chronic obstructive pulmonary disease) ([place]) 06/13/2020   Bronchiectasis without complication ([place]) 13/24/4010   Lymphedema 05/17/2019   Chronic venous insufficiency 05/17/2019   Swelling of limb 02/10/2018   Varicose veins of leg with swelling, bilateral 02/10/2018   Squamous cell carcinoma 09/22/2017   Hypoxia 09/27/2015   Abnormal CXR 09/27/2015    Asthma with allergic rhinitis 08/31/2015   Back ache 08/31/2015   Chronic interstitial cystitis 08/31/2015   Grief reaction 08/31/2015   Hyperlipidemia 06/26/2015   Migraine 06/26/2015   Asthma 06/26/2015   IBS (irritable bowel syndrome) 06/26/2015   Osteoporosis 06/26/2015   Fatigue 06/26/2015   Lumbar radiculopathy 04/27/2013   Urinary urgency 01/08/2012   Frequency of urination 01/08/2012    Past Surgical History:  Procedure Laterality Date   ABDOMINAL HYSTERECTOMY  2003   BREAST BIOPSY  1976, 1978   benign   carpal tunnel repair Bilateral    R 1990; L 1997   COLONOSCOPY WITH PROPOFOL N/A 08/14/2020   Procedure: COLONOSCOPY WITH PROPOFOL;  Surgeon: Lucilla Lame, MD;  Location: [facility] ENDOSCOPY;  Service: Endoscopy;  Laterality: N/A;   ESOPHAGOGASTRODUODENOSCOPY N/A 08/12/2020   Procedure: ESOPHAGOGASTRODUODENOSCOPY (EGD);  Surgeon: Lin Landsman, MD;  Location: [facility] ENDOSCOPY;  Service: Gastroenterology;  Laterality: N/A;   EYE SURGERY Bilateral    cataract extraction   HAND TENDON SURGERY Right    Trigger finger   IR RADIOLOGIST EVAL & MGMT  06/13/2017   IR VERTEBROPLASTY CERV/THOR BX INC UNI/BIL INC/INJECT/IMAGING  06/17/2017   TONSILLECTOMY  1961    Prior to Admission medications   Medication Sig Start Date End Date Taking? Authorizing Provider  albuterol (PROVENTIL) (2.5 MG/3ML) 0.083% nebulizer solution Take 3 mLs (2.5 mg total) by nebulization every 6 (six) hours as needed for wheezing or shortness of breath. 09/19/20   Flora Lipps, MD  dicyclomine (BENTYL) 10 MG capsule Take 1  capsule (10 mg total) by mouth 3 (three) times daily as needed for spasms. 06/22/20   Mar Daring, PA-C  doxepin (SINEQUAN) 10 MG capsule TAKE 3 CAPSULES BY MOUTH AT BEDTIME 04/20/21   Jerrol Banana., MD  feeding supplement, ENSURE ENLIVE, (ENSURE ENLIVE) LIQD Take 237 mLs by mouth 2 (two) times daily between meals. 06/16/20   Hongalgi, Lenis Dickinson, MD  ferrous gluconate (FERGON) 324 MG  tablet Take 1 tablet (324 mg total) by mouth daily with breakfast. 03/27/21   Just, Laurita Quint, FNP  fluticasone (FLONASE) 50 MCG/ACT nasal spray Place 2 sprays into both nostrils daily. 05/31/21   Jerrol Banana., MD  Fluticasone-Umeclidin-Vilant (TRELEGY ELLIPTA) 100-62.5-25 MCG/INH AEPB Inhale 1 puff into the lungs daily. 06/22/20   Mar Daring, PA-C  furosemide (LASIX) 40 MG tablet Take 1 tablet (40 mg total) by mouth daily as needed for edema. 10/30/20   Mar Daring, PA-C  nystatin (MYCOSTATIN) 100000 UNIT/ML suspension TAKE 5 MLS BY MOUTH 4 TIMES A DAY. 10/30/20   Mar Daring, PA-C  oxybutynin (DITROPAN XL) 15 MG 24 hr tablet Take 1 tablet (15 mg total) by mouth at bedtime. 06/22/20   Mar Daring, PA-C  OXYGEN Inhale 2 L into the lungs daily.    [provider]  pentosan polysulfate (ELMIRON) 100 MG capsule Take 1 capsule (100 mg total) by mouth 2 (two) times daily as needed (urinary pain). 10/30/20   Mar Daring, PA-C  traMADol (ULTRAM) 50 MG tablet TAKE 1 TABLET BY MOUTH EVERY 8 HOURS AS NEEDED FOR PAIN 06/05/21   Virginia Crews, MD  Urea 40 % LOTN Apply 1 application topically daily as needed (dry skin).  06/22/20   Mar Daring, PA-C  VENTOLIN HFA 108 (90 Base) MCG/ACT inhaler INHALE TWO PUFFS INTO THE LUNGS EVERY 4 HOURS AS NEEDED FOR WHEEZING OR SHORTNESS OF BREATH Patient taking differently: Inhale 2 puffs into the lungs every 4 (four) hours as needed for wheezing or shortness of breath. 06/22/20   Mar Daring, PA-C  verapamil (CALAN-SR) 120 MG CR tablet TAKE 1 TABLET BY MOUTH AT BEDTIME 05/31/21   Jerrol Banana., MD    Allergies Nsaids; Amoxicillin; Ibandronic acid; Actonel  [risedronate]; Antihistamines, chlorpheniramine-type; Aspirin; Buspar [buspirone]; Butalbital-aspirin-caffeine; Clarithromycin; Codeine; Gatifloxacin; Hydrocodone-guaifenesin; Moxifloxacin; Oxycodone; Penicillins; Risedronate sodium;  Tylenol with codeine #3  [acetaminophen-codeine]; and Raloxifene  Family History  Problem Relation Age of Onset   Heart disease Mother    CAD Mother    Congestive Heart Failure Mother    Osteoarthritis Mother    Cancer Sister        breast   Breast cancer Sister     Social History Social History   Tobacco Use   Smoking status: Never   Smokeless tobacco: Never  Vaping Use   Vaping Use: Never used  Substance Use Topics   Alcohol use: No   Drug use: No    Review of Systems  Constitutional: Positive fever/chills Eyes: No visual changes. ENT: No sore throat. Respiratory: Denies cough, positive shortness of breath Cardiovascular: Denies chest pain Gastrointestinal: Denies abdominal pain Genitourinary: Negative for dysuria. Musculoskeletal: Negative for back pain. Skin: Negative for rash. Psychiatric: no mood changes,     ____________________________________________   PHYSICAL EXAM:  VITAL SIGNS: ED Triage Vitals  Enc Vitals Group     BP 06/28/21 1729 (!) 119/43     Pulse Rate 06/28/21 1729 (!) 110     Resp  06/28/21 1729 20     Temp 06/28/21 1729 97.9 F (36.6 C)     Temp Source 06/28/21 1729 Oral     SpO2 06/28/21 1729 96 %     Weight 06/28/21 1730 118 lb 13.3 oz (53.9 kg)     Height 06/28/21 1730 4\' 9"  (1.448 m)     Head Circumference --      Peak Flow --      Pain Score 06/28/21 1730 0     Pain Loc --      Pain Edu? --      Excl. in [place]? --     Constitutional: Alert and oriented. Well appearing and in no acute distress. Eyes: Conjunctivae are normal.  Head: Atraumatic. Nose: No congestion/rhinnorhea. Mouth/Throat: Mucous membranes are moist.   Neck:  supple no lymphadenopathy noted Cardiovascular: Normal rate, regular rhythm. Heart sounds are normal Respiratory: Normal respiratory effort.  No retractions, lungs c wheezing bilaterally Abd: soft nontender bs normal all 4 quad GU: deferred Musculoskeletal: FROM all extremities, warm and well  perfused Neurologic:  Normal speech and language.  Skin:  Skin is warm, dry and intact. No rash noted. Psychiatric: Mood and affect are normal. Speech and behavior are normal.  ____________________________________________   LABS (all labs ordered are listed, but only abnormal results are displayed)  Labs Reviewed  COMPREHENSIVE METABOLIC PANEL - Abnormal; Notable for the following components:      Result Value   CO2 33 (*)    Glucose, Bld 100 (*)    All other components within normal limits  CBC - Abnormal; Notable for the following components:   Hemoglobin 11.3 (*)    RDW 19.3 (*)    All other components within normal limits  RESP PANEL BY RT-PCR (FLU A&B, COVID) ARPGX2  LACTIC ACID, PLASMA  LACTIC ACID, PLASMA  BRAIN NATRIURETIC PEPTIDE  POC OCCULT BLOOD, ED  TYPE AND SCREEN  TROPONIN I (HIGH SENSITIVITY)   ____________________________________________   ____________________________________________  RADIOLOGY  Chest x-ray  ____________________________________________   PROCEDURES  Procedure(s) performed: EKG shows afib, see physician read  Procedures    ____________________________________________   INITIAL IMPRESSION / ASSESSMENT AND PLAN / ED COURSE  Pertinent labs & imaging results that were available during my care of the patient were reviewed by me and considered in my medical decision making (see chart for details).   Patient is a 85 year old female presents with shortness of breath and weakness.  See HPI.  Physical exam shows patient appears stable  DDx: CAP, covid, MI,GI bleed, PE, CHF  Labs at this time are reassuring, CBC and metabolic panel are normal, patient's type and screen is a positive  EKG, chest x-ray  Ekg shows afib, see physician read  Cxr reviewed by me, radiology confirms no acute abnormality Care transferred to dr Charna Archer     Teola Bradley was evaluated in Emergency Department on 06/28/2021 for the symptoms described  in the history of present illness. She was evaluated in the context of the global COVID-19 pandemic, which necessitated consideration that the patient might be at risk for infection with the SARS-CoV-2 virus that causes COVID-19. Institutional protocols and algorithms that pertain to the evaluation of patients at risk for COVID-19 are in a state of rapid change based on information released by regulatory bodies including the CDC and federal and state organizations. These policies and algorithms were followed during the patient's care in the ED.    As part of my medical decision making, I reviewed  the following data within the [facility] notes reviewed and incorporated, Labs reviewed , Old chart reviewed, Radiograph reviewed , Evaluated by EM attending , Notes from prior ED visits, and [place] Controlled Substance Database  ____________________________________________   FINAL CLINICAL IMPRESSION(S) / ED DIAGNOSES  Final diagnoses:  Weakness      NEW MEDICATIONS STARTED DURING THIS VISIT:  New Prescriptions   No medications on file     Note:  This document was prepared using Dragon voice recognition software and may include unintentional dictation errors.    Versie Starks, PA-C 06/28/21 1947    Blake Divine, MD 06/28/21 [phone]

## 2021-06-28 NOTE — Telephone Encounter (Signed)
FYI

## 2021-06-28 NOTE — ED Notes (Signed)
ED Provider at bedside. 

## 2021-06-28 NOTE — Telephone Encounter (Signed)
Patient is calling to report she is weak and having- "dry heaves". Patient got hot last night. Patient has history of A fib and anemia and patient states these are her symptoms. Advised patient due to GI bleed history and presence of blood in stool with increased fatigue and nausea- ED. Reason for Disposition  Follows bleeding (e.g., from vomiting, rectum, vagina; Exception: small brief weakness from sight of a small amount blood)  Answer Assessment - Initial Assessment Questions 1. NAUSEA SEVERITY: "How bad is the nausea?" (e.g., mild, moderate, severe; dehydration, weight loss)   - MILD: loss of appetite without change in eating habits   - MODERATE: decreased oral intake without significant weight loss, dehydration, or malnutrition   - SEVERE: inadequate caloric or fluid intake, significant weight loss, symptoms of dehydration     Dry heaving- no vomiting 2. ONSET: "When did the nausea begin?"     Last night 3. VOMITING: "Any vomiting?" If Yes, ask: "How many times today?"     No vomiting 4. RECURRENT SYMPTOM: "Have you had nausea before?" If Yes, ask: "When was the last time?" "What happened that time?"     Yes- anemic, Afib- patient was hospitalized 5. CAUSE: "What do you think is causing the nausea?"     Not sure 6. PREGNANCY: "Is there any chance you are pregnant?" (e.g., unprotected intercourse, missed birth control pill, broken condom)     N/a  Answer Assessment - Initial Assessment Questions 1. DESCRIPTION: "Describe how you are feeling."     weak 2. SEVERITY: "How bad is it?"  "Can you stand and walk?"   - MILD - Feels weak or tired, but does not interfere with work, school or normal activities   - [place] to stand and walk; weakness interferes with work, school, or normal activities   - SEVERE - Unable to stand or walk     moderate 3. ONSET:  "When did the weakness begin?"     Last night 4. CAUSE: "What do you think is causing the weakness?"     anemia 5.  MEDICINES: "Have you recently started a new medicine or had a change in the amount of a medicine?"     no 6. OTHER SYMPTOMS: "Do you have any other symptoms?" (e.g., chest pain, fever, cough, SOB, vomiting, diarrhea, bleeding, other areas of pain)     GI bleed- patient has had to have infusions 7. PREGNANCY: "Is there any chance you are pregnant?" "When was your last menstrual period?"     N/a  Protocols used: Nausea-A-AH, Weakness (Generalized) and Fatigue-A-AH

## 2021-06-28 NOTE — ED Triage Notes (Signed)
Pt comes into the ED via POV c/o need for blood transfusion.  Pt has known GI bleed, and undergoes regular infusions outpatient.  Pt states they werent able to complete a transfusion this week and she is now symptomatic.  Pt still having rectal bleeding but now is also c/o SHOB and weakness.  Pt in NAD at this time with even and unlabored respirations.  Pt wears 2L chronically but has bumped it up to 4L at home.

## 2021-06-28 NOTE — Telephone Encounter (Signed)
Noted  

## 2021-06-29 ENCOUNTER — Ambulatory Visit (INDEPENDENT_AMBULATORY_CARE_PROVIDER_SITE_OTHER): Payer: Medicare HMO

## 2021-06-29 DIAGNOSIS — R5383 Other fatigue: Secondary | ICD-10-CM

## 2021-06-29 DIAGNOSIS — J449 Chronic obstructive pulmonary disease, unspecified: Secondary | ICD-10-CM

## 2021-06-29 DIAGNOSIS — I4891 Unspecified atrial fibrillation: Secondary | ICD-10-CM | POA: Diagnosis not present

## 2021-06-29 NOTE — Chronic Care Management (AMB) (Signed)
Chronic Care Management   CCM RN Visit Note  06/29/2021 Name: Jackie Turner MRN: [id number] DOB: Jun 15, 1936  Subjective: Jackie Turner is a 85 y.o. year old female who is a primary care patient of Bacigalupo, Dionne Bucy, MD. The care management team was consulted for assistance with disease management and care coordination needs.    Engaged with patient by telephone for follow up visit in response to provider referral for case management and/or care coordination services.   Consent to Services:  The patient was given information about Chronic Care Management services, agreed to services, and gave verbal consent prior to initiation of services.  Please see initial visit note for detailed documentation.   Assessment: Review of patient past medical history, allergies, medications, health status, including review of consultants reports, laboratory and other test data, was performed as part of comprehensive evaluation and provision of chronic care management services.   SDOH (Social Determinants of Health) assessments and interventions performed: No  CCM Care Plan  Allergies  Allergen Reactions   Nsaids Other (See Comments)    History of gastric ulcer   Amoxicillin Nausea Only and Rash   Ibandronic Acid Other (See Comments)    Muscle weakness - advised not to take  **BONIVA** - drug name   Actonel  [Risedronate] Other (See Comments)    Gastric ulcers   Antihistamines, Chlorpheniramine-Type    Aspirin     Gastric ulcer   Buspar [Buspirone] Other (See Comments)    Pt does not remember reaction    Butalbital-Aspirin-Caffeine Other (See Comments)    Other reaction(s): Unknown   Clarithromycin Other (See Comments)    Bloating  *BIAXIN*   Codeine    Gatifloxacin Other (See Comments)   Hydrocodone-Guaifenesin Nausea Only    CODICLEAR DH STYRUP    Moxifloxacin    Oxycodone Other (See Comments)    Dizziness   Penicillins Other (See Comments)   Risedronate Sodium      Gastric ulcer   Tylenol With Codeine #3  [Acetaminophen-Codeine]     Other reaction(s): Unknown   Raloxifene Other (See Comments)    Bloating Bloating  *EVISTA*    Outpatient Encounter Medications as of 06/29/2021  Medication Sig Note   albuterol (PROVENTIL) (2.5 MG/3ML) 0.083% nebulizer solution Take 3 mLs (2.5 mg total) by nebulization every 6 (six) hours as needed for wheezing or shortness of breath. (Patient not taking: Reported on 06/29/2021) 06/29/2021: Reports not currently using.   dicyclomine (BENTYL) 10 MG capsule Take 1 capsule (10 mg total) by mouth 3 (three) times daily as needed for spasms.    doxepin (SINEQUAN) 10 MG capsule TAKE 3 CAPSULES BY MOUTH AT BEDTIME    feeding supplement, ENSURE ENLIVE, (ENSURE ENLIVE) LIQD Take 237 mLs by mouth 2 (two) times daily between meals.    ferrous gluconate (FERGON) 324 MG tablet Take 1 tablet (324 mg total) by mouth daily with breakfast.    fluticasone (FLONASE) 50 MCG/ACT nasal spray Place 2 sprays into both nostrils daily.    Fluticasone-Umeclidin-Vilant (TRELEGY ELLIPTA) 100-62.5-25 MCG/INH AEPB Inhale 1 puff into the lungs daily.    furosemide (LASIX) 40 MG tablet Take 1 tablet (40 mg total) by mouth daily as needed for edema.    nystatin (MYCOSTATIN) 100000 UNIT/ML suspension TAKE 5 MLS BY MOUTH 4 TIMES A DAY.    oxybutynin (DITROPAN XL) 15 MG 24 hr tablet Take 1 tablet (15 mg total) by mouth at bedtime.    OXYGEN Inhale 2 L into the lungs daily.  pentosan polysulfate (ELMIRON) 100 MG capsule Take 1 capsule (100 mg total) by mouth 2 (two) times daily as needed (urinary pain).    traMADol (ULTRAM) 50 MG tablet TAKE 1 TABLET BY MOUTH EVERY 8 HOURS AS NEEDED FOR PAIN    Urea 40 % LOTN Apply 1 application topically daily as needed (dry skin).     VENTOLIN HFA 108 (90 Base) MCG/ACT inhaler INHALE TWO PUFFS INTO THE LUNGS EVERY 4 HOURS AS NEEDED FOR WHEEZING OR SHORTNESS OF BREATH (Patient taking differently: Inhale 2 puffs into the lungs  every 4 (four) hours as needed for wheezing or shortness of breath.)    verapamil (CALAN-SR) 120 MG CR tablet TAKE 1 TABLET BY MOUTH AT BEDTIME    No facility-administered encounter medications on file as of 06/29/2021.    Patient Active Problem List   Diagnosis Date Noted   Primary hypertension 06/05/2021   Atrial fibrillation with rapid ventricular response ([place]) 03/20/2021   Microcytic anemia 03/19/2021   Iron deficiency 03/19/2021   A-fib ([place]) 03/19/2021   Memory loss 11/21/2020   Polyp of colon    Acute blood loss anemia 08/09/2020   GI bleeding 08/04/2020   Depression, major, single episode, in partial remission ([place]) 06/22/2020   Atrial fibrillation with RVR ([place]) 06/13/2020   Chronic respiratory failure with hypoxia ([place]) 06/13/2020   Right hand pain 06/13/2020   Fall at home, initial encounter 06/13/2020   COPD (chronic obstructive pulmonary disease) ([place]) 06/13/2020   Bronchiectasis without complication ([place]) 24/23/5361   Lymphedema 05/17/2019   Chronic venous insufficiency 05/17/2019   Swelling of limb 02/10/2018   Varicose veins of leg with swelling, bilateral 02/10/2018   Squamous cell carcinoma 09/22/2017   Hypoxia 09/27/2015   Abnormal CXR 09/27/2015   Asthma with allergic rhinitis 08/31/2015   Back ache 08/31/2015   Chronic interstitial cystitis 08/31/2015   Grief reaction 08/31/2015   Hyperlipidemia 06/26/2015   Migraine 06/26/2015   Asthma 06/26/2015   IBS (irritable bowel syndrome) 06/26/2015   Osteoporosis 06/26/2015   Fatigue 06/26/2015   Lumbar radiculopathy 04/27/2013   Urinary urgency 01/08/2012   Frequency of urination 01/08/2012    Conditions to be addressed/monitored:COPD and Fall Risk Patient Care Plan: COPD (Adult)     Problem Identified: Symptom Exacerbation (COPD)      Long-Range Goal: Symptom Exacerbation Prevented or Minimized   Start Date: 06/29/2021  Expected End Date: 10/27/2021  Priority: High  Current Barriers:  Chronic  Care Management support and educational needs related to COPD.  Case Manager Clinical Goal(s): Over the next 120 days, patient will not require hospitalization due to complications related to COPD.  Interventions:  Collaboration with Brita Romp Dionne Bucy, MD regarding development and update of comprehensive plan of care as evidenced by provider attestation and co-signature Inter-disciplinary care team collaboration (see longitudinal plan of care) Reviewed medications and current treatment plan for COPD management. Reports taking medications as prescribed. Tolerating regimen well. She was evaluated in the ED for weakness on 06/28/21. Continues to experience shortness of breath with exertion. Denies shortness of breath at rest. No cough. Continues to use supplemental O2 at 2-4 L/min.  Reviewed importance of completing daily self assessments. Advised to make an appointment with provider if in the yellow zone for 48 hours without improvement. Discussed increased risk for complications r/t respiratory infections. Reviewed prevention strategies to reduce risk of respiratory infection. Reviewed worsening s/sx that require immediate medical attention.   Self-Care Activities:  Self administers medications as prescribed Calls pharmacy for medication  refills Performs ADL's independently Calls provider office for new concerns or questions  Patient Goals: Eliminate symptom triggers at home Keep follow-up appointments  Follow Up Plan:  Will follow up next month     Patient Care Plan: Fall Risk (Adult)     Problem Identified: Fall Risk      Long-Range Goal: Absence of Fall and Fall-Related Injury   Start Date: 06/29/2021  Expected End Date: 10/27/2021  Priority: High  Current Barriers:  Risk for Falls  Clinical Goal(s):  Over the next 120 days, patient will not experience falls or require emergent care due to fall related injuries.  Interventions:  Collaboration with Brita Romp Dionne Bucy, MD regarding development and update of comprehensive plan of care as evidenced by provider attestation and co-signature Inter-disciplinary care team collaboration (see longitudinal plan of care) Reviewed medications and discussed potential side effects of medications such as dizziness. Reviewed fall and safety prevention measures. She was evaluated in the ED on 06/28/2021. Denies recent falls. Discussed importance of utilizing an assistive device when ambulating.  Discussed ability to perform ADL's. Reports performing ADL's and small in home tasks independently. Reports very good support from friends. Denies concerns regarding transportation. Denies need for in-home assistance. Reviewed ED discharge instructions and recommended follow up visits. Assisted with scheduling PCP and Hematology appointments.  Self-Care/Patient Goals:  Utilize appropriate assistive device with all ambulation Change positions slowly Wear secure non skid footwear with all ambulation Utilize home lighting for dim lit areas and ensure pathways are clear Complete follow up appointments as recommended by ED provider  Follow Up Plan:  Will follow up next month       PLAN A member of the care management team will follow up with Jackie Turner next month.   [name] Care Management [facility] [phone]

## 2021-06-29 NOTE — Patient Instructions (Addendum)
Thank you for allowing the Chronic Care Management team to participate in your care.   Goals Addressed Patient Care Plan: COPD (Adult)     Problem Identified: Symptom Exacerbation (COPD)      Long-Range Goal: Symptom Exacerbation Prevented or Minimized   Start Date: 06/29/2021  Expected End Date: 10/27/2021  Priority: High  Current Barriers:  Chronic Care Management support and educational needs related to COPD.  Case Manager Clinical Goal(s): Over the next 120 days, patient will not require hospitalization due to complications related to COPD.  Interventions:  Collaboration with Brita Romp Dionne Bucy, MD regarding development and update of comprehensive plan of care as evidenced by provider attestation and co-signature Inter-disciplinary care team collaboration (see longitudinal plan of care) Reviewed medications and current treatment plan for COPD management. Reports taking medications as prescribed. Tolerating regimen well. She was evaluated in the ED for weakness on 06/28/21. Continues to experience shortness of breath with exertion. Denies shortness of breath at rest. No cough. Continues to use supplemental O2 at 2-4 L/min.  Reviewed importance of completing daily self assessments. Advised to make an appointment with provider if in the yellow zone for 48 hours without improvement. Discussed increased risk for complications r/t respiratory infections. Reviewed prevention strategies to reduce risk of respiratory infection. Reviewed worsening s/sx that require immediate medical attention.   Self-Care Activities:  Self administers medications as prescribed Calls pharmacy for medication refills Performs ADL's independently Calls provider office for new concerns or questions  Patient Goals: Eliminate symptom triggers at home Keep follow-up appointments  Follow Up Plan:  Will follow up next month     Patient Care Plan: Fall Risk (Adult)     Problem Identified: Fall Risk       Long-Range Goal: Absence of Fall and Fall-Related Injury   Start Date: 06/29/2021  Expected End Date: 10/27/2021  Priority: High  Current Barriers:  Risk for Falls  Clinical Goal(s):  Over the next 120 days, patient will not experience falls or require emergent care due to fall related injuries.  Interventions:  Collaboration with Brita Romp Dionne Bucy, MD regarding development and update of comprehensive plan of care as evidenced by provider attestation and co-signature Inter-disciplinary care team collaboration (see longitudinal plan of care) Reviewed medications and discussed potential side effects of medications such as dizziness. Reviewed fall and safety prevention measures. She was evaluated in the ED on 06/28/2021. Denies recent falls. Discussed importance of utilizing an assistive device when ambulating.  Discussed ability to perform ADL's. Reports performing ADL's and small in home tasks independently. Reports very good support from friends. Denies concerns regarding transportation. Denies need for in-home assistance. Reviewed ED discharge instructions and recommended follow up visits. Assisted with scheduling PCP and Hematology appointments.  Self-Care/Patient Goals:  Utilize appropriate assistive device with all ambulation Change positions slowly Wear secure non skid footwear with all ambulation Utilize home lighting for dim lit areas and ensure pathways are clear Complete follow up appointments as recommended by ED provider  Follow Up Plan:  Will follow up next month       Mrs. Zaucha verbalized understanding of the information discussed during the telephonic outreach today. Declined need for mailed/printed instructions. A member of the care management team will follow up with Mrs. Lafortune next month.    [name] [phone]

## 2021-07-02 ENCOUNTER — Encounter: Payer: Self-pay | Admitting: Internal Medicine

## 2021-07-11 ENCOUNTER — Ambulatory Visit: Payer: Medicare HMO | Admitting: Surgery

## 2021-07-11 ENCOUNTER — Encounter: Payer: Self-pay | Admitting: Surgery

## 2021-07-11 ENCOUNTER — Other Ambulatory Visit: Payer: Self-pay

## 2021-07-11 VITALS — BP 141/71 | HR 123 | Temp 99.0°F | Ht <= 58 in | Wt 120.0 lb

## 2021-07-11 DIAGNOSIS — K6389 Other specified diseases of intestine: Secondary | ICD-10-CM | POA: Diagnosis not present

## 2021-07-11 NOTE — Patient Instructions (Addendum)
Follow up with Dr Rogue Bussing next week.   Follow-up with our office as needed.  Please call and ask to speak with a nurse if you develop questions or concerns.

## 2021-07-12 ENCOUNTER — Encounter: Payer: Self-pay | Admitting: Family Medicine

## 2021-07-12 ENCOUNTER — Ambulatory Visit (INDEPENDENT_AMBULATORY_CARE_PROVIDER_SITE_OTHER): Payer: Medicare HMO | Admitting: Family Medicine

## 2021-07-12 ENCOUNTER — Other Ambulatory Visit: Payer: Self-pay | Admitting: Physician Assistant

## 2021-07-12 ENCOUNTER — Other Ambulatory Visit: Payer: Self-pay

## 2021-07-12 ENCOUNTER — Other Ambulatory Visit: Payer: Self-pay | Admitting: Family Medicine

## 2021-07-12 VITALS — BP 144/78 | HR 103 | Temp 98.7°F | Ht <= 58 in | Wt 122.0 lb

## 2021-07-12 DIAGNOSIS — Z8719 Personal history of other diseases of the digestive system: Secondary | ICD-10-CM | POA: Diagnosis not present

## 2021-07-12 DIAGNOSIS — M5416 Radiculopathy, lumbar region: Secondary | ICD-10-CM

## 2021-07-12 DIAGNOSIS — R1110 Vomiting, unspecified: Secondary | ICD-10-CM

## 2021-07-12 DIAGNOSIS — J9621 Acute and chronic respiratory failure with hypoxia: Secondary | ICD-10-CM

## 2021-07-12 DIAGNOSIS — S22000D Wedge compression fracture of unspecified thoracic vertebra, subsequent encounter for fracture with routine healing: Secondary | ICD-10-CM

## 2021-07-12 DIAGNOSIS — J432 Centrilobular emphysema: Secondary | ICD-10-CM | POA: Diagnosis not present

## 2021-07-12 DIAGNOSIS — J4540 Moderate persistent asthma, uncomplicated: Secondary | ICD-10-CM

## 2021-07-12 DIAGNOSIS — M48061 Spinal stenosis, lumbar region without neurogenic claudication: Secondary | ICD-10-CM

## 2021-07-12 DIAGNOSIS — J9611 Chronic respiratory failure with hypoxia: Secondary | ICD-10-CM | POA: Diagnosis not present

## 2021-07-12 DIAGNOSIS — J479 Bronchiectasis, uncomplicated: Secondary | ICD-10-CM

## 2021-07-12 DIAGNOSIS — F324 Major depressive disorder, single episode, in partial remission: Secondary | ICD-10-CM

## 2021-07-12 NOTE — Progress Notes (Signed)
Established patient visit   Patient: Jackie Turner   DOB: [birth year]   85 y.o. Female  MRN: [id number] Visit Date: 07/12/2021  Today's healthcare provider: Vernie Murders, PA-C   No chief complaint on file.  Subjective    HPI  Follow up ER visit for weakness on 06-28-21. Has a history of GI bleed and dry heaves without actual vomiting (no hematemesis). Also, has chronic respiratory failure with centrilobular emphysema. No pneumonia on CXR  Patient Active Problem List   Diagnosis Date Noted   Primary hypertension 06/05/2021   Atrial fibrillation with rapid ventricular response ([place]) 03/20/2021   Microcytic anemia 03/19/2021   Iron deficiency 03/19/2021   A-fib ([place]) 03/19/2021   Memory loss 11/21/2020   Polyp of colon    Acute blood loss anemia 08/09/2020   GI bleeding 08/04/2020   Depression, major, single episode, in partial remission ([place]) 06/22/2020   Atrial fibrillation with RVR ([place]) 06/13/2020   Chronic respiratory failure with hypoxia ([place]) 06/13/2020   Right hand pain 06/13/2020   Fall at home, initial encounter 06/13/2020   COPD (chronic obstructive pulmonary disease) ([place]) 06/13/2020   Bronchiectasis without complication ([place]) [id number]   Lymphedema 05/17/2019   Chronic venous insufficiency 05/17/2019   Swelling of limb 02/10/2018   Varicose veins of leg with swelling, bilateral 02/10/2018   Squamous cell carcinoma 09/22/2017   Hypoxia 09/27/2015   Abnormal CXR 09/27/2015   Asthma with allergic rhinitis 08/31/2015   Back ache 08/31/2015   Chronic interstitial cystitis 08/31/2015   Grief reaction 08/31/2015   Hyperlipidemia 06/26/2015   Migraine 06/26/2015   Asthma 06/26/2015   IBS (irritable bowel syndrome) 06/26/2015   Osteoporosis 06/26/2015   Fatigue 06/26/2015   Lumbar radiculopathy 04/27/2013   Urinary urgency 01/08/2012   Frequency of urination 01/08/2012   Past Medical History:  Diagnosis Date   Allergy    Asthma     Hyperlipidemia    Osteoporosis    Allergies  Allergen Reactions   Nsaids Other (See Comments)    History of gastric ulcer   Amoxicillin Nausea Only and Rash   Ibandronic Acid Other (See Comments)    Muscle weakness - advised not to take  **BONIVA** - drug name   Actonel  [Risedronate] Other (See Comments)    Gastric ulcers   Antihistamines, Chlorpheniramine-Type    Aspirin     Gastric ulcer   Buspar [Buspirone] Other (See Comments)    Pt does not remember reaction    Butalbital-Aspirin-Caffeine Other (See Comments)    Other reaction(s): Unknown   Clarithromycin Other (See Comments)    Bloating  *BIAXIN*   Codeine    Gatifloxacin Other (See Comments)   Hydrocodone-Guaifenesin Nausea Only    CODICLEAR DH STYRUP    Moxifloxacin    Oxycodone Other (See Comments)    Dizziness   Penicillins Other (See Comments)   Risedronate Sodium     Gastric ulcer   Tylenol With Codeine #3  [Acetaminophen-Codeine]     Other reaction(s): Unknown   Raloxifene Other (See Comments)    Bloating Bloating  *EVISTA*       Medications: Outpatient Medications Prior to Visit  Medication Sig   albuterol (PROVENTIL) (2.5 MG/3ML) 0.083% nebulizer solution Take 3 mLs (2.5 mg total) by nebulization every 6 (six) hours as needed for wheezing or shortness of breath.   dicyclomine (BENTYL) 10 MG capsule Take 1 capsule (10 mg total) by mouth 3 (three) times daily as needed for spasms.  doxepin (SINEQUAN) 10 MG capsule TAKE 3 CAPSULES BY MOUTH AT BEDTIME   feeding supplement, ENSURE ENLIVE, (ENSURE ENLIVE) LIQD Take 237 mLs by mouth 2 (two) times daily between meals.   ferrous gluconate (FERGON) 324 MG tablet Take 1 tablet (324 mg total) by mouth daily with breakfast.   fluticasone (FLONASE) 50 MCG/ACT nasal spray Place 2 sprays into both nostrils daily.   Fluticasone-Umeclidin-Vilant (TRELEGY ELLIPTA) 100-62.5-25 MCG/INH AEPB Inhale 1 puff into the lungs daily.   furosemide (LASIX) 40 MG tablet Take 1  tablet (40 mg total) by mouth daily as needed for edema.   nystatin (MYCOSTATIN) 100000 UNIT/ML suspension TAKE 5 MLS BY MOUTH 4 TIMES A DAY.   oxybutynin (DITROPAN XL) 15 MG 24 hr tablet Take 1 tablet (15 mg total) by mouth at bedtime.   OXYGEN Inhale 2 L into the lungs daily.   pentosan polysulfate (ELMIRON) 100 MG capsule Take 1 capsule (100 mg total) by mouth 2 (two) times daily as needed (urinary pain).   traMADol (ULTRAM) 50 MG tablet TAKE 1 TABLET BY MOUTH EVERY 8 HOURS AS NEEDED FOR PAIN   Urea 40 % LOTN Apply 1 application topically daily as needed (dry skin).    VENTOLIN HFA 108 (90 Base) MCG/ACT inhaler INHALE TWO PUFFS INTO THE LUNGS EVERY 4 HOURS AS NEEDED FOR WHEEZING OR SHORTNESS OF BREATH (Patient taking differently: Inhale 2 puffs into the lungs every 4 (four) hours as needed for wheezing or shortness of breath.)   verapamil (CALAN-SR) 120 MG CR tablet TAKE 1 TABLET BY MOUTH AT BEDTIME   No facility-administered medications prior to visit.    Review of Systems  Constitutional:  Positive for fatigue. Negative for fever.  HENT: Negative.    Respiratory:  Positive for shortness of breath.   Cardiovascular:        History of A.fib with tachycardia.  Gastrointestinal:  Positive for nausea. Negative for abdominal pain and vomiting.   Last CBC Lab Results  Component Value Date   WBC 8.3 06/28/2021   HGB 11.3 (L) 06/28/2021   HCT 36.7 06/28/2021   MCV 85.2 06/28/2021   MCH 26.2 06/28/2021   RDW 19.3 (H) 06/28/2021   PLT 274 [id number]   Last metabolic panel Lab Results  Component Value Date   GLUCOSE 100 (H) 06/28/2021   NA 141 06/28/2021   K 4.5 06/28/2021   CL 98 06/28/2021   CO2 33 (H) 06/28/2021   BUN 18 06/28/2021   CREATININE 0.61 06/28/2021   GFRNONAA >60 06/28/2021   GFRAA >60 09/04/2020   CALCIUM 9.3 06/28/2021   PHOS 2.3 (L) 08/08/2020   PROT 6.9 06/28/2021   ALBUMIN 4.1 06/28/2021   LABGLOB 2.4 03/01/2021   AGRATIO 1.7 03/01/2021   BILITOT 0.5  06/28/2021   ALKPHOS 89 06/28/2021   AST 24 06/28/2021   ALT 21 06/28/2021   ANIONGAP 10 06/28/2021   Last lipids Lab Results  Component Value Date   CHOL 158 03/01/2021   HDL 51 03/01/2021   LDLCALC 87 03/01/2021   TRIG 112 03/01/2021   CHOLHDL 3.0 06/03/2016   Last hemoglobin A1c Lab Results  Component Value Date   HGBA1C 6.2 (H) 08/06/2020   Last thyroid functions Lab Results  Component Value Date   TSH 1.700 03/19/2021   Last vitamin D No results found for: 25OHVITD2, 25OHVITD3, VD25OH Last vitamin B12 and Folate Lab Results  Component Value Date   VITAMINB12 236 03/19/2021   FOLATE 23.0 03/19/2021  Objective    BP (!) 144/78   Pulse (!) 103   Temp 98.7 F (37.1 C) (Oral)   Ht '4\' 10"'$  (1.473 m)   Wt 122 lb (55.3 kg)   SpO2 94%   BMI 25.50 kg/m  BP Readings from Last 3 Encounters:  07/11/21 (!) 141/71  06/28/21 130/84  06/05/21 126/76   Wt Readings from Last 3 Encounters:  07/11/21 120 lb (54.4 kg)  06/28/21 118 lb 13.3 oz (53.9 kg)  06/05/21 118 lb 12.8 oz (53.9 kg)   Physical Exam Constitutional:      General: She is not in acute distress.    Appearance: She is well-developed.  HENT:     Head: Normocephalic and atraumatic.     Right Ear: Hearing normal.     Left Ear: Hearing normal.     Nose: Nose normal.  Eyes:     General: Lids are normal. No scleral icterus.       Right eye: No discharge.        Left eye: No discharge.     Conjunctiva/sclera: Conjunctivae normal.  Cardiovascular:     Rate and Rhythm: Tachycardia present.     Comments: History of Atrial Fibrillation. Pulmonary:     Effort: No respiratory distress.     Comments: Dyspnea improved with oxygen at 2LPM 24 hrs a day. Abdominal:     General: Bowel sounds are normal.     Palpations: Abdomen is soft.  Musculoskeletal:        General: Normal range of motion.  Skin:    Findings: No lesion or rash.  Neurological:     Mental Status: She is alert and oriented to person,  place, and time.  Psychiatric:        Speech: Speech normal.        Behavior: Behavior normal.        Thought Content: Thought content normal.      No results found for any visits on 07/12/21.  Assessment & Plan     1. Dry heaves Went to the ER on 06-28-21 to be evaluated for weakness with dry heaves. Was concerned she had another GI bleed. Hgb was 11.3 and no hematochezia recognized. Has had good control of dry heaves with use of Emetrol. May continue as needed and follow up with GI.  2. History of GI bleed Followed by surgeon (Dr. Dahlia Byes) and hematologist (Dr. Rogue Bussing). No active bleeding at the present.  3. Chronic respiratory failure with hypoxia (HCC) Followed by Dr. Mortimer Fries (pulmonologist) and continues oxygen by nasal cannula at 2 LPM continuously.  4. Centrilobular emphysema (HCC) Uses Albuterol by nebulizer prn wheezing with oxygen 24 hrs a day and followed by pulmonologist.   No follow-ups on file.      I, Aneudy Champlain, PA-C, have reviewed all documentation for this visit. The documentation on 09/08/21 for the exam, diagnosis, procedures, and orders are all accurate and complete.    Vernie Murders, PA-C  [employer] [phone] (phone) [phone] (fax)  [place]

## 2021-07-12 NOTE — Telephone Encounter (Signed)
Requested medication (s) are due for refill today: yes   Requested medication (s) are on the active medication list: yes   Last refill:  06/05/21 #90 0 refills   Future visit scheduled: no seen today   Notes to clinic:  not delegated per protocol     Requested Prescriptions  Pending Prescriptions Disp Refills   traMADol (ULTRAM) 50 MG tablet [Pharmacy Med Name: TRAMADOL HCL 50 MG TAB] 90 tablet     Sig: TAKE 1 TABLET BY MOUTH EVERY 8 HOURS AS NEEDED FOR PAIN      Not Delegated - Analgesics:  Opioid Agonists Failed - 07/12/2021  3:17 PM      Failed - This refill cannot be delegated      Failed - Urine Drug Screen completed in last 360 days      Passed - Valid encounter within last 6 months    Recent Outpatient Visits           Today    [place], PA-C   1 month ago Atrial fibrillation with RVR [facility])   [facility] [place], Dionne Bucy, MD   3 months ago Chronic obstructive pulmonary disease, unspecified COPD type [facility])   [facility] Jerrol Banana., MD   3 months ago Hospital discharge follow-up   [facility], Laurita Quint, FNP   4 months ago Atrial fibrillation with RVR [facility])   [facility], [place], [place]

## 2021-07-13 ENCOUNTER — Encounter: Payer: Self-pay | Admitting: Surgery

## 2021-07-13 NOTE — Progress Notes (Signed)
Outpatient Surgical Follow Up  07/13/2021  Tawonna Carrieann Gilvin is an 85 y.o. female.   Chief Complaint  Patient presents with   Follow-up    GI Bleed    HPI: He is an 85 year old female well-known to me with severe COPD and she uses 2 to 4 L of nasal cannula and her heart rate stays baseline in the 110s to 130s due to her significant pulmonary disorder.  She was found to have 50 mm tubular adenoma at the ileocecal valve that was able to be resected endoscopically.  Now she comes for follow-up.  She had some intermittent hematochezia.  Recently seen in the emergency room due to failure to thrive and dry heaving  Past Medical History:  Diagnosis Date   Allergy    Asthma    Hyperlipidemia    Osteoporosis     Past Surgical History:  Procedure Laterality Date   ABDOMINAL HYSTERECTOMY  2003   BREAST BIOPSY  1976, 1978   benign   carpal tunnel repair Bilateral    R 1990; L 1997   COLONOSCOPY WITH PROPOFOL N/A 08/14/2020   Procedure: COLONOSCOPY WITH PROPOFOL;  Surgeon: Lucilla Lame, MD;  Location: [facility] ENDOSCOPY;  Service: Endoscopy;  Laterality: N/A;   ESOPHAGOGASTRODUODENOSCOPY N/A 08/12/2020   Procedure: ESOPHAGOGASTRODUODENOSCOPY (EGD);  Surgeon: Lin Landsman, MD;  Location: [facility] ENDOSCOPY;  Service: Gastroenterology;  Laterality: N/A;   EYE SURGERY Bilateral    cataract extraction   HAND TENDON SURGERY Right    Trigger finger   IR RADIOLOGIST EVAL & MGMT  06/13/2017   IR VERTEBROPLASTY CERV/THOR BX INC UNI/BIL INC/INJECT/IMAGING  06/17/2017   TONSILLECTOMY  1961    Family History  Problem Relation Age of Onset   Heart disease Mother    CAD Mother    Congestive Heart Failure Mother    Osteoarthritis Mother    Cancer Sister        breast   Breast cancer Sister     Social History:  reports that she has never smoked. She has never used smokeless tobacco. She reports that she does not drink alcohol and does not use drugs.  Allergies:  Allergies  Allergen Reactions    Nsaids Other (See Comments)    History of gastric ulcer   Amoxicillin Nausea Only and Rash   Ibandronic Acid Other (See Comments)    Muscle weakness - advised not to take  **BONIVA** - drug name   Actonel  [Risedronate] Other (See Comments)    Gastric ulcers   Antihistamines, Chlorpheniramine-Type    Aspirin     Gastric ulcer   Buspar [Buspirone] Other (See Comments)    Pt does not remember reaction    Butalbital-Aspirin-Caffeine Other (See Comments)    Other reaction(s): Unknown   Clarithromycin Other (See Comments)    Bloating  *BIAXIN*   Codeine    Gatifloxacin Other (See Comments)   Hydrocodone-Guaifenesin Nausea Only    CODICLEAR DH STYRUP    Moxifloxacin    Oxycodone Other (See Comments)    Dizziness   Penicillins Other (See Comments)   Risedronate Sodium     Gastric ulcer   Tylenol With Codeine #3  [Acetaminophen-Codeine]     Other reaction(s): Unknown   Raloxifene Other (See Comments)    Bloating Bloating  *EVISTA*    Medications reviewed.    ROS Full ROS performed and is otherwise negative other than what is stated in HPI   BP (!) 141/71   Pulse (!) 123   Temp 99  F (37.2 C)   Ht '4\' 10"'$  (1.473 m)   Wt 120 lb (54.4 kg)   SpO2 94%   BMI 25.08 kg/m   Physical Exam Vitals and nursing note reviewed. Exam conducted with a chaperone present.  Constitutional:      Appearance: Normal appearance.     Comments: Dyspnea at rest wearing home oxygen.  This is baseline due to her severe pulmonary condition  Cardiovascular:     Rate and Rhythm: Regular rhythm.     Heart sounds: Normal heart sounds.     Comments: Sinus tachycardia Pulmonary:     Effort: Respiratory distress present.     Breath sounds: Wheezing and rales present.  Abdominal:     General: There is no distension.     Palpations: There is no mass.     Tenderness: There is no abdominal tenderness.     Hernia: No hernia is present.  Musculoskeletal:     Cervical back: Normal range of  motion and neck supple. No tenderness.  Skin:    General: Skin is warm and dry.     Capillary Refill: Capillary refill takes less than 2 seconds.  Neurological:     General: No focal deficit present.     Mental Status: She is alert and oriented to person, place, and time.  Psychiatric:        Mood and Affect: Mood normal.        Behavior: Behavior normal.        Thought Content: Thought content normal.        Judgment: Judgment normal.      Assessment/Plan:  85 year old pulmonary cripple with known colonic mass  on endoscopy.  Her COPD continues to play a major role in her health.  Now has some intermittent hematochezia.   She wishes to see oncology regarding her anemia wishes to have their opinion regarding need to do a further colonoscopy or not.  From a surgical perspective I think she is at prohibitive risk to perform any major elective operation.  He understands this and is in agreement with me.  We will follow her on prn basis  Greater than 50% of the 30 minutes  visit was spent in counseling/coordination of care   Caroleen Hamman, MD [place] [facility]geon

## 2021-07-16 ENCOUNTER — Ambulatory Visit (INDEPENDENT_AMBULATORY_CARE_PROVIDER_SITE_OTHER): Payer: Medicare HMO

## 2021-07-16 DIAGNOSIS — J449 Chronic obstructive pulmonary disease, unspecified: Secondary | ICD-10-CM | POA: Diagnosis not present

## 2021-07-16 NOTE — Telephone Encounter (Signed)
Copied from [place] [id number]. Topic: General - Other >> Jul 16, 2021 10:09 AM Bayard Beaver wrote: Reason for CRM: pharmacy called requesting short supply of tramadol till refill comes in

## 2021-07-19 ENCOUNTER — Other Ambulatory Visit: Payer: Self-pay | Admitting: *Deleted

## 2021-07-19 DIAGNOSIS — D509 Iron deficiency anemia, unspecified: Secondary | ICD-10-CM

## 2021-07-19 DIAGNOSIS — E611 Iron deficiency: Secondary | ICD-10-CM

## 2021-07-20 ENCOUNTER — Inpatient Hospital Stay: Payer: Medicare HMO | Admitting: Internal Medicine

## 2021-07-20 ENCOUNTER — Inpatient Hospital Stay: Payer: Medicare HMO | Attending: Internal Medicine

## 2021-07-20 ENCOUNTER — Other Ambulatory Visit: Payer: Self-pay

## 2021-07-20 ENCOUNTER — Encounter: Payer: Self-pay | Admitting: Internal Medicine

## 2021-07-20 DIAGNOSIS — Z79899 Other long term (current) drug therapy: Secondary | ICD-10-CM | POA: Diagnosis not present

## 2021-07-20 DIAGNOSIS — E611 Iron deficiency: Secondary | ICD-10-CM

## 2021-07-20 DIAGNOSIS — D509 Iron deficiency anemia, unspecified: Secondary | ICD-10-CM | POA: Diagnosis present

## 2021-07-20 DIAGNOSIS — I4891 Unspecified atrial fibrillation: Secondary | ICD-10-CM | POA: Diagnosis not present

## 2021-07-20 LAB — CBC WITH DIFFERENTIAL/PLATELET
Abs Immature Granulocytes: 0.03 10*3/uL (ref 0.00–0.07)
Basophils Absolute: 0.1 10*3/uL (ref 0.0–0.1)
Basophils Relative: 1 %
Eosinophils Absolute: 0.2 10*3/uL (ref 0.0–0.5)
Eosinophils Relative: 3 %
HCT: 40.6 % (ref 36.0–46.0)
Hemoglobin: 12.6 g/dL (ref 12.0–15.0)
Immature Granulocytes: 0 %
Lymphocytes Relative: 15 %
Lymphs Abs: 1.1 10*3/uL (ref 0.7–4.0)
MCH: 27.5 pg (ref 26.0–34.0)
MCHC: 31 g/dL (ref 30.0–36.0)
MCV: 88.6 fL (ref 80.0–100.0)
Monocytes Absolute: 0.7 10*3/uL (ref 0.1–1.0)
Monocytes Relative: 9 %
Neutro Abs: 5.3 10*3/uL (ref 1.7–7.7)
Neutrophils Relative %: 72 %
Platelets: 280 10*3/uL (ref 150–400)
RBC: 4.58 MIL/uL (ref 3.87–5.11)
RDW: 17.1 % — ABNORMAL HIGH (ref 11.5–15.5)
WBC: 7.3 10*3/uL (ref 4.0–10.5)
nRBC: 0 % (ref 0.0–0.2)

## 2021-07-20 LAB — BASIC METABOLIC PANEL WITH GFR
Anion gap: 7 (ref 5–15)
BUN: 24 mg/dL — ABNORMAL HIGH (ref 8–23)
CO2: 36 mmol/L — ABNORMAL HIGH (ref 22–32)
Calcium: 9.7 mg/dL (ref 8.9–10.3)
Chloride: 97 mmol/L — ABNORMAL LOW (ref 98–111)
Creatinine, Ser: 0.79 mg/dL (ref 0.44–1.00)
GFR, Estimated: >60 mL/min
Glucose, Bld: 112 mg/dL — ABNORMAL HIGH (ref 70–99)
Potassium: 4.8 mmol/L (ref 3.5–5.1)
Sodium: 140 mmol/L (ref 135–145)

## 2021-07-20 LAB — IRON AND TIBC
Iron: 170 ug/dL (ref 28–170)
Saturation Ratios: 31 % (ref 10.4–31.8)
TIBC: 545 ug/dL — ABNORMAL HIGH (ref 250–450)
UIBC: 375 ug/dL

## 2021-07-20 LAB — FERRITIN: Ferritin: 12 ng/mL (ref 11–307)

## 2021-07-20 MED ORDER — FERROUS GLUCONATE 324 (38 FE) MG PO TABS: 324.0000 mg | ORAL_TABLET | Freq: Every day | ORAL | 1 refills | Status: DC

## 2021-07-20 NOTE — Assessment & Plan Note (Addendum)
#  Severe iron deficient anemia-March 2022-hemoglobin 7.3.  S/p IV iron infusions-hemoglobin today is 13.  Hold any infusion.  # ETIOLOGY-EGD-no obvious bleeding/colonoscopy cecal polyp- adenoma-? Source of bleeding [AUG 2021]- not resected given location.    # A.fib not on Eliquis- [Dr.Parschoes]-A. fib rate controlled.  # DISPOSITION: # follow up in 4 months/NP- Labs-cbc/bmp;possible Venofer-Dr.B

## 2021-07-20 NOTE — Progress Notes (Signed)
[place] NOTE  Patient Care Team: Brita Romp Dionne Bucy, MD as PCP - General (Family Medicine) Domingo Pulse, MD as Consulting Physician (Urology) Solum, Betsey Holiday, MD as Physician Assistant (Endocrinology) Theresa Duty, MD (Radiology) Jules Husbands, MD as Consulting Physician (General Surgery) Flora Lipps, MD as Consulting Physician (Pulmonary Disease) Isaias Cowman, MD as Consulting Physician (Cardiology) Dingeldein, Remo Lipps, MD (Ophthalmology) Neldon Labella, RN as Case Manager Cammie Sickle, MD as Consulting Physician (Internal Medicine)  CHIEF COMPLAINTS/PURPOSE OF CONSULTATION: ANEMIA   HEMATOLOGY HISTORY:  #Severe iron deficient anemia Jackie Turner 2022- 7.3] EGD-; colonoscopy- cecal polyp/adenoma [not resected]  #COPD on 2 L of oxygen; history of A. Fib [Dr. Paraschos]-not on Eliquis.  HISTORY OF PRESENTING ILLNESS:  Jackie Turner 85 y.o.  female with multiple medical problems including COPD on 2 L of oxygen ; A. fib not on anticoagulation is here for follow-up.  Nausea vomiting abdominal pain.  Patient denies any nausea vomiting abdominal pain.  Chronic mild fatigue.  Review of Systems  Constitutional:  Positive for malaise/fatigue. Negative for chills, diaphoresis and fever.  HENT:  Negative for nosebleeds and sore throat.   Eyes:  Negative for double vision.  Respiratory:  Positive for shortness of breath. Negative for cough, hemoptysis, sputum production and wheezing.   Cardiovascular:  Negative for chest pain, palpitations, orthopnea and leg swelling.  Gastrointestinal:  Negative for abdominal pain, blood in stool, constipation, diarrhea, heartburn, nausea and vomiting.  Genitourinary:  Negative for dysuria, frequency and urgency.  Musculoskeletal:  Positive for back pain and joint pain.  Skin: Negative.  Negative for itching and rash.  Neurological:  Negative for tingling, focal weakness, weakness and headaches.   Endo/Heme/Allergies:  Does not bruise/bleed easily.  Psychiatric/Behavioral:  Negative for depression. The patient is not nervous/anxious and does not have insomnia.    MEDICAL HISTORY:  Past Medical History:  Diagnosis Date   Allergy    Asthma    Hyperlipidemia    Osteoporosis     SURGICAL HISTORY: Past Surgical History:  Procedure Laterality Date   ABDOMINAL HYSTERECTOMY  2003   BREAST BIOPSY  1976, 1978   benign   carpal tunnel repair Bilateral    R 1990; L 1997   COLONOSCOPY WITH PROPOFOL N/A 08/14/2020   Procedure: COLONOSCOPY WITH PROPOFOL;  Surgeon: Lucilla Lame, MD;  Location: [facility] ENDOSCOPY;  Service: Endoscopy;  Laterality: N/A;   ESOPHAGOGASTRODUODENOSCOPY N/A 08/12/2020   Procedure: ESOPHAGOGASTRODUODENOSCOPY (EGD);  Surgeon: Lin Landsman, MD;  Location: [facility] ENDOSCOPY;  Service: Gastroenterology;  Laterality: N/A;   EYE SURGERY Bilateral    cataract extraction   HAND TENDON SURGERY Right    Trigger finger   IR RADIOLOGIST EVAL & MGMT  06/13/2017   IR VERTEBROPLASTY CERV/THOR BX INC UNI/BIL INC/INJECT/IMAGING  06/17/2017   TONSILLECTOMY  1961    SOCIAL HISTORY: Social History   Socioeconomic History   Marital status: Widowed    Spouse name: [name]   Number of children: 0   Years of education: H/S   Highest education level: 12th grade  Occupational History   Occupation: Retired  Tobacco Use   Smoking status: Never   Smokeless tobacco: Never  Vaping Use   Vaping Use: Never used  Substance and Sexual Activity   Alcohol use: No   Drug use: No   Sexual activity: Never  Other Topics Concern   Not on file  Social History Narrative   Not on file   Social Determinants of Health  Financial Resource Strain: Low Risk    Difficulty of Paying Living Expenses: Not hard at all  Food Insecurity: No Food Insecurity   Worried About Charity fundraiser in the Last Year: Never true   Ran Out of Food in the Last Year: Never true  Transportation Needs: No  Transportation Needs   Lack of Transportation (Medical): No   Lack of Transportation (Non-Medical): No  Physical Activity: Inactive   Days of Exercise per Week: 0 days   Minutes of Exercise per Session: 0 min  Stress: No Stress Concern Present   Feeling of Stress : Not at all  Social Connections: Socially Isolated   Frequency of Communication with Friends and Family: More than three times a week   Frequency of Social Gatherings with Friends and Family: More than three times a week   Attends Religious Services: Never   Marine scientist or Organizations: No   Attends Archivist Meetings: Never   Marital Status: Widowed  Human resources officer Violence: Not At Risk   Fear of Current or Ex-Partner: No   Emotionally Abused: No   Physically Abused: No   Sexually Abused: No    FAMILY HISTORY: Family History  Problem Relation Age of Onset   Heart disease Mother    CAD Mother    Congestive Heart Failure Mother    Osteoarthritis Mother    Cancer Sister        breast   Breast cancer Sister     ALLERGIES:  is allergic to nsaids; amoxicillin; ibandronic acid; actonel  [risedronate]; antihistamines, chlorpheniramine-type; aspirin; buspar [buspirone]; butalbital-aspirin-caffeine; clarithromycin; codeine; gatifloxacin; hydrocodone-guaifenesin; moxifloxacin; oxycodone; penicillins; risedronate sodium; tylenol with codeine #3  [acetaminophen-codeine]; and raloxifene.  MEDICATIONS:  Current Outpatient Medications  Medication Sig Dispense Refill   albuterol (PROVENTIL) (2.5 MG/3ML) 0.083% nebulizer solution Take 3 mLs (2.5 mg total) by nebulization every 6 (six) hours as needed for wheezing or shortness of breath. 75 mL 12   Calcium Carb-Cholecalciferol 600-400 MG-UNIT TABS Take by mouth.     dicyclomine (BENTYL) 10 MG capsule Take 1 capsule (10 mg total) by mouth 3 (three) times daily as needed for spasms. 90 capsule 1   doxepin (SINEQUAN) 10 MG capsule TAKE 3 CAPSULES BY MOUTH AT  BEDTIME 270 capsule 0   feeding supplement, ENSURE ENLIVE, (ENSURE ENLIVE) LIQD Take 237 mLs by mouth 2 (two) times daily between meals. 237 mL 12   fluticasone (FLONASE) 50 MCG/ACT nasal spray Place 2 sprays into both nostrils daily. 16 g 5   furosemide (LASIX) 40 MG tablet Take 1 tablet (40 mg total) by mouth daily as needed for edema. 90 tablet 1   nystatin (MYCOSTATIN) 100000 UNIT/ML suspension TAKE 5 MLS BY MOUTH 4 TIMES A DAY. 60 mL 5   oxybutynin (DITROPAN XL) 15 MG 24 hr tablet Take 1 tablet (15 mg total) by mouth at bedtime. 90 tablet 1   OXYGEN Inhale 2 L into the lungs daily.     pentosan polysulfate (ELMIRON) 100 MG capsule Take 1 capsule (100 mg total) by mouth 2 (two) times daily as needed (urinary pain). 180 capsule 1   traMADol (ULTRAM) 50 MG tablet TAKE 1 TABLET BY MOUTH EVERY 8 HOURS AS NEEDED FOR PAIN 90 tablet 0   TRELEGY ELLIPTA 100-62.5-25 MCG/INH AEPB INHALE 1 PUFF INTO THE LUNGS DAILY 60 each 11   Urea 40 % LOTN Apply 1 application topically daily as needed (dry skin).   0   VENTOLIN HFA 108 (90  Base) MCG/ACT inhaler INHALE TWO PUFFS INTO THE LUNGS EVERY 4 HOURS AS NEEDED FOR WHEEZING OR SHORTNESS OF BREATH (Patient taking differently: Inhale 2 puffs into the lungs every 4 (four) hours as needed for wheezing or shortness of breath.) 18 g 5   verapamil (CALAN-SR) 120 MG CR tablet TAKE 1 TABLET BY MOUTH AT BEDTIME 90 tablet 1   ferrous gluconate (FERGON) 324 MG tablet Take 1 tablet (324 mg total) by mouth daily with breakfast. 90 tablet 1   No current facility-administered medications for this visit.      PHYSICAL EXAMINATION:   Vitals:   07/20/21 1523  BP: (!) 148/82  Pulse: (!) 111  Resp: 18  Temp: 98.2 F (36.8 C)  SpO2: 98%   Filed Weights   07/20/21 1523  Weight: 118 lb (53.5 kg)    Physical Exam Constitutional:      Comments: Frail-appearing Caucasian female patient.  She is accompanied by her friend.  She is ambulating herself.  HENT:     Head:  Normocephalic and atraumatic.     Mouth/Throat:     Pharynx: No oropharyngeal exudate.  Eyes:     Pupils: Pupils are equal, round, and reactive to light.  Cardiovascular:     Rate and Rhythm: Tachycardia present. Rhythm irregular.  Pulmonary:     Effort: No respiratory distress.     Breath sounds: No wheezing.     Comments: Decreased air entry bilaterally.  No wheeze. Abdominal:     General: Bowel sounds are normal. There is no distension.     Palpations: Abdomen is soft. There is no mass.     Tenderness: There is no abdominal tenderness. There is no guarding or rebound.  Musculoskeletal:        General: No tenderness. Normal range of motion.     Cervical back: Normal range of motion and neck supple.  Skin:    General: Skin is warm.  Neurological:     Mental Status: She is alert and oriented to person, place, and time.  Psychiatric:        Mood and Affect: Affect normal.    LABORATORY DATA:  I have reviewed the data as listed Lab Results  Component Value Date   WBC 7.3 07/20/2021   HGB 12.6 07/20/2021   HCT 40.6 07/20/2021   MCV 88.6 07/20/2021   PLT 280 07/20/2021   Recent Labs    08/15/20 0417 08/24/20 1139 09/04/20 1606 03/01/21 1627 03/19/21 1545 03/20/21 0413 06/28/21 1733 07/20/21 1455  NA 141 141 141   < > 140 137 141 140  K 4.0 4.5 3.8   < > 5.3* 4.8 4.5 4.8  CL 98 93* 92*   < > 100 98 98 97*  CO2 34* 34* 38*   < > 32 29 33* 36*  GLUCOSE 107* 105* 106*   < > 141* 195* 100* 112*  BUN '16 20 23   '$ < > 31* 31* 18 24*  CREATININE 0.70 0.95 0.98   < > 0.66 0.80 0.61 0.79  CALCIUM 8.7* 9.6 10.1   < > 9.4 9.2 9.3 9.7  GFRNONAA >60 56* 54*   < > >60 >60 >60 >60  GFRAA >60 65 >60  --   --   --   --   --   PROT  --   --  7.4   < > 7.0 6.8 6.9  --   ALBUMIN  --   --  4.3   < > 3.9  3.8 4.1  --   AST  --   --  21   < > 32 41 24  --   ALT  --   --  16   < > '16 18 21  '$ --   ALKPHOS  --   --  69   < > 60 53 89  --   BILITOT  --   --  0.6   < > 0.5 0.8 0.5  --    < >  = values in this interval not displayed.     DG Chest Portable 1 View  Result Date: 06/28/2021 CLINICAL DATA:  85 year old female with shortness of breath. EXAM: PORTABLE CHEST 1 VIEW COMPARISON:  Chest radiograph dated 03/19/2021 and CT dated 08/08/2020. FINDINGS: There is eventration of the left hemidiaphragm. Left lung base atelectasis. No focal consolidation, pleural effusion, or pneumothorax. There is chronic interstitial coarsening. Mild cardiomegaly. Atherosclerotic calcification of the aorta. No acute osseous pathology. IMPRESSION: 1. No acute cardiopulmonary process. 2. Eventration of the left hemidiaphragm with left lung base atelectasis similar to prior radiograph. 3. Mild cardiomegaly. Electronically Signed   By: Anner Crete M.D.   On: 06/28/2021 19:40    Microcytic anemia #Severe iron deficient anemia-March 2022-hemoglobin 7.3.  S/p IV iron infusions-hemoglobin today is 13.  Hold any infusion.  # ETIOLOGY-EGD-no obvious bleeding/colonoscopy cecal polyp- adenoma-? Source of bleeding [AUG 2021]- not resected given location.    # A.fib not on Eliquis- [Dr.Parschoes]-A. fib rate controlled.  # DISPOSITION: # follow up in 4 months/NP- Labs-cbc/bmp;possible Venofer-Dr.B  All questions were answered. The patient knows to call the clinic with any problems, questions or concerns.    Cammie Sickle, MD 07/20/2021 11:33 PM

## 2021-07-23 NOTE — Patient Instructions (Addendum)
Thank you for allowing the Chronic Care Management team to participate in your care.   Goal Addressed: Patient Care Plan: COPD (Adult)     Problem Identified: Symptom Exacerbation (COPD)      Long-Range Goal: Symptom Exacerbation Prevented or Minimized   Start Date: 06/29/2021  Expected End Date: 10/27/2021  Priority: High  Note:   Current Barriers:  Chronic Care Management support and educational needs related to COPD.  Case Manager Clinical Goal(s): Over the next 120 days, patient will not require hospitalization due to complications related to COPD.  Interventions:  Collaboration with Brita Romp Dionne Bucy, MD regarding development and update of comprehensive plan of care as evidenced by provider attestation and co-signature Inter-disciplinary care team collaboration (see longitudinal plan of care) Reviewed medications and current treatment plan for COPD management. Continues taking medications as prescribed. Tolerating regimen well. Continues to experience shortness of breath with exertion. Denies shortness of breath at rest. No cough. Continues to use supplemental O2 at 2-4 L/min. Reports doing well since ED visit last month. Reviewed importance of completing daily self assessments. Advised to continue assessing symptoms daily and notify a provider if symptoms persists. Discussed need for follow up with the Pulmonology team. She requested assistance with scheduling a routine follow up. Will contact the Pulmonology team to schedule. Reviewed worsening s/sx that require immediate medical attention.   Self-Care/Patient Goals: Self administer medications as prescribed Assess symptoms daily Eliminate symptom triggers in the home Complete routine follow up with the Pulmonology team Calls provider office for new concerns or questions   Follow Up Plan:  Will follow up later this month       Mrs. Preziosi verbalized understanding of the information discussed during the telephonic  outreach today. Declined need for mailed/printed instructions. A member of the care management team will follow up later this month.    [name] Heath/[facility] Care Management [facility] [phone]

## 2021-07-23 NOTE — Chronic Care Management (AMB) (Signed)
Chronic Care Management   CCM RN Visit Note   Name: Jackie Turner MRN: [id number] DOB: 26-Jul-1936  Subjective: Jackie Turner is a 85 y.o. year old female who is a primary care patient of Bacigalupo, Dionne Bucy, MD. The care management team was consulted for assistance with disease management and care coordination needs.    Engaged with patient by telephone for follow up visit in response to provider referral for case management and/or care coordination services.   Consent to Services:  The patient was given information about Chronic Care Management services, agreed to services, and gave verbal consent prior to initiation of services.  Please see initial visit note for detailed documentation.    Assessment: Review of patient past medical history, allergies, medications, health status, including review of consultants reports, laboratory and other test data, was performed as part of comprehensive evaluation and provision of chronic care management services.   SDOH (Social Determinants of Health) assessments and interventions performed: No  CCM Care Plan  Allergies  Allergen Reactions   Nsaids Other (See Comments)    History of gastric ulcer   Amoxicillin Nausea Only and Rash   Ibandronic Acid Other (See Comments)    Muscle weakness - advised not to take  **BONIVA** - drug name   Actonel  [Risedronate] Other (See Comments)    Gastric ulcers   Antihistamines, Chlorpheniramine-Type    Aspirin     Gastric ulcer   Buspar [Buspirone] Other (See Comments)    Pt does not remember reaction    Butalbital-Aspirin-Caffeine Other (See Comments)    Other reaction(s): Unknown   Clarithromycin Other (See Comments)    Bloating  *BIAXIN*   Codeine    Gatifloxacin Other (See Comments)   Hydrocodone-Guaifenesin Nausea Only    CODICLEAR DH STYRUP    Moxifloxacin    Oxycodone Other (See Comments)    Dizziness   Penicillins Other (See Comments)   Risedronate Sodium      Gastric ulcer   Tylenol With Codeine #3  [Acetaminophen-Codeine]     Other reaction(s): Unknown   Raloxifene Other (See Comments)    Bloating Bloating  *EVISTA*    Outpatient Encounter Medications as of 07/16/2021  Medication Sig Note   albuterol (PROVENTIL) (2.5 MG/3ML) 0.083% nebulizer solution Take 3 mLs (2.5 mg total) by nebulization every 6 (six) hours as needed for wheezing or shortness of breath. 06/29/2021: Reports not currently using.   dicyclomine (BENTYL) 10 MG capsule Take 1 capsule (10 mg total) by mouth 3 (three) times daily as needed for spasms.    doxepin (SINEQUAN) 10 MG capsule TAKE 3 CAPSULES BY MOUTH AT BEDTIME    feeding supplement, ENSURE ENLIVE, (ENSURE ENLIVE) LIQD Take 237 mLs by mouth 2 (two) times daily between meals.    fluticasone (FLONASE) 50 MCG/ACT nasal spray Place 2 sprays into both nostrils daily.    furosemide (LASIX) 40 MG tablet Take 1 tablet (40 mg total) by mouth daily as needed for edema.    nystatin (MYCOSTATIN) 100000 UNIT/ML suspension TAKE 5 MLS BY MOUTH 4 TIMES A DAY.    oxybutynin (DITROPAN XL) 15 MG 24 hr tablet Take 1 tablet (15 mg total) by mouth at bedtime.    OXYGEN Inhale 2 L into the lungs daily.    pentosan polysulfate (ELMIRON) 100 MG capsule Take 1 capsule (100 mg total) by mouth 2 (two) times daily as needed (urinary pain).    traMADol (ULTRAM) 50 MG tablet TAKE 1 TABLET BY MOUTH EVERY 8 HOURS  AS NEEDED FOR PAIN    TRELEGY ELLIPTA 100-62.5-25 MCG/INH AEPB INHALE 1 PUFF INTO THE LUNGS DAILY    Urea 40 % LOTN Apply 1 application topically daily as needed (dry skin).     VENTOLIN HFA 108 (90 Base) MCG/ACT inhaler INHALE TWO PUFFS INTO THE LUNGS EVERY 4 HOURS AS NEEDED FOR WHEEZING OR SHORTNESS OF BREATH (Patient taking differently: Inhale 2 puffs into the lungs every 4 (four) hours as needed for wheezing or shortness of breath.)    verapamil (CALAN-SR) 120 MG CR tablet TAKE 1 TABLET BY MOUTH AT BEDTIME    [DISCONTINUED] ferrous gluconate  (FERGON) 324 MG tablet Take 1 tablet (324 mg total) by mouth daily with breakfast.    [DISCONTINUED] traMADol (ULTRAM) 50 MG tablet TAKE 1 TABLET BY MOUTH EVERY 8 HOURS AS NEEDED FOR PAIN    No facility-administered encounter medications on file as of 07/16/2021.    Patient Active Problem List   Diagnosis Date Noted   Primary hypertension 06/05/2021   Atrial fibrillation with rapid ventricular response ([place]) 03/20/2021   Microcytic anemia 03/19/2021   Iron deficiency 03/19/2021   A-fib ([place]) 03/19/2021   Memory loss 11/21/2020   Polyp of colon    Acute blood loss anemia 08/09/2020   GI bleeding 08/04/2020   Depression, major, single episode, in partial remission ([place]) 06/22/2020   Atrial fibrillation with RVR ([place]) 06/13/2020   Chronic respiratory failure with hypoxia ([place]) 06/13/2020   Right hand pain 06/13/2020   Fall at home, initial encounter 06/13/2020   COPD (chronic obstructive pulmonary disease) ([place]) 06/13/2020   Bronchiectasis without complication ([place]) [id number]   Lymphedema 05/17/2019   Chronic venous insufficiency 05/17/2019   Swelling of limb 02/10/2018   Varicose veins of leg with swelling, bilateral 02/10/2018   Squamous cell carcinoma 09/22/2017   Hypoxia 09/27/2015   Abnormal CXR 09/27/2015   Asthma with allergic rhinitis 08/31/2015   Back ache 08/31/2015   Chronic interstitial cystitis 08/31/2015   Grief reaction 08/31/2015   Hyperlipidemia 06/26/2015   Migraine 06/26/2015   Asthma 06/26/2015   IBS (irritable bowel syndrome) 06/26/2015   Osteoporosis 06/26/2015   Fatigue 06/26/2015   Lumbar radiculopathy 04/27/2013   Urinary urgency 01/08/2012   Frequency of urination 01/08/2012    Conditions to be addressed/monitored:COPD Patient Care Plan: COPD (Adult)     Problem Identified: Symptom Exacerbation (COPD)      Long-Range Goal: Symptom Exacerbation Prevented or Minimized   Start Date: 06/29/2021  Expected End Date: 10/27/2021  Priority: High   Note:   Current Barriers:  Chronic Care Management support and educational needs related to COPD.  Case Manager Clinical Goal(s): Over the next 120 days, patient will not require hospitalization due to complications related to COPD.  Interventions:  Collaboration with Brita Romp Dionne Bucy, MD regarding development and update of comprehensive plan of care as evidenced by provider attestation and co-signature Inter-disciplinary care team collaboration (see longitudinal plan of care) Reviewed medications and current treatment plan for COPD management. Continues taking medications as prescribed. Tolerating regimen well. Continues to experience shortness of breath with exertion. Denies shortness of breath at rest. No cough. Continues to use supplemental O2 at 2-4 L/min. Reports doing well since ED visit last month. Reviewed importance of completing daily self assessments. Advised to continue assessing symptoms daily and notify a provider if symptoms persists. Discussed need for follow up with the Pulmonology team. She requested assistance with scheduling a routine follow up. Will contact the Pulmonology team to schedule. Reviewed worsening  s/sx that require immediate medical attention.   Self-Care/Patient Goals: Self administer medications as prescribed Assess symptoms daily Eliminate symptom triggers in the home Complete routine follow up with the Pulmonology team Calls provider office for new concerns or questions   Follow Up Plan:  Will follow up later this month      PLAN A member of the care management team will follow up with Jackie Turner later this month.    [name] Heath/THN Care Management [facility] [phone]

## 2021-08-10 ENCOUNTER — Ambulatory Visit: Payer: Self-pay

## 2021-08-10 DIAGNOSIS — J449 Chronic obstructive pulmonary disease, unspecified: Secondary | ICD-10-CM

## 2021-08-10 DIAGNOSIS — Z9181 History of falling: Secondary | ICD-10-CM

## 2021-08-10 NOTE — Chronic Care Management (AMB) (Signed)
Chronic Care Management   CCM RN Visit Note  08/10/2021 Name: Jackie Turner MRN: [id number] DOB: 09-06-1936  Subjective: Jackie Turner is a 85 y.o. year old female who is a primary care patient of Bacigalupo, Dionne Bucy, MD. The care management team was consulted for assistance with disease management and care coordination needs.    Engaged with patient by telephone for follow up visit in response to provider referral for case management and care coordination services.   Consent to Services:  The patient was given information about Chronic Care Management services, agreed to services, and gave verbal consent prior to initiation of services.  Please see initial visit note for detailed documentation.    Assessment: Review of patient past medical history, allergies, medications, health status, including review of consultants reports, laboratory and other test data, was performed as part of comprehensive evaluation and provision of chronic care management services.   SDOH (Social Determinants of Health) assessments and interventions performed:  No  CCM Care Plan  Allergies  Allergen Reactions   Nsaids Other (See Comments)    History of gastric ulcer   Amoxicillin Nausea Only and Rash   Ibandronic Acid Other (See Comments)    Muscle weakness - advised not to take  **BONIVA** - drug name   Actonel  [Risedronate] Other (See Comments)    Gastric ulcers   Antihistamines, Chlorpheniramine-Type    Aspirin     Gastric ulcer   Buspar [Buspirone] Other (See Comments)    Pt does not remember reaction    Butalbital-Aspirin-Caffeine Other (See Comments)    Other reaction(s): Unknown   Clarithromycin Other (See Comments)    Bloating  *BIAXIN*   Codeine    Gatifloxacin Other (See Comments)   Hydrocodone-Guaifenesin Nausea Only    CODICLEAR DH STYRUP    Moxifloxacin    Oxycodone Other (See Comments)    Dizziness   Penicillins Other (See Comments)   Risedronate Sodium      Gastric ulcer   Tylenol With Codeine #3  [Acetaminophen-Codeine]     Other reaction(s): Unknown   Raloxifene Other (See Comments)    Bloating Bloating  *EVISTA*    Outpatient Encounter Medications as of 08/10/2021  Medication Sig Note   albuterol (PROVENTIL) (2.5 MG/3ML) 0.083% nebulizer solution Take 3 mLs (2.5 mg total) by nebulization every 6 (six) hours as needed for wheezing or shortness of breath. 06/29/2021: Reports not currently using.   Calcium Carb-Cholecalciferol 600-400 MG-UNIT TABS Take by mouth.    dicyclomine (BENTYL) 10 MG capsule Take 1 capsule (10 mg total) by mouth 3 (three) times daily as needed for spasms.    doxepin (SINEQUAN) 10 MG capsule TAKE 3 CAPSULES BY MOUTH AT BEDTIME    feeding supplement, ENSURE ENLIVE, (ENSURE ENLIVE) LIQD Take 237 mLs by mouth 2 (two) times daily between meals.    ferrous gluconate (FERGON) 324 MG tablet Take 1 tablet (324 mg total) by mouth daily with breakfast.    fluticasone (FLONASE) 50 MCG/ACT nasal spray Place 2 sprays into both nostrils daily.    furosemide (LASIX) 40 MG tablet Take 1 tablet (40 mg total) by mouth daily as needed for edema.    nystatin (MYCOSTATIN) 100000 UNIT/ML suspension TAKE 5 MLS BY MOUTH 4 TIMES A DAY.    oxybutynin (DITROPAN XL) 15 MG 24 hr tablet Take 1 tablet (15 mg total) by mouth at bedtime.    OXYGEN Inhale 2 L into the lungs daily.    pentosan polysulfate (ELMIRON) 100 MG capsule  Take 1 capsule (100 mg total) by mouth 2 (two) times daily as needed (urinary pain).    traMADol (ULTRAM) 50 MG tablet TAKE 1 TABLET BY MOUTH EVERY 8 HOURS AS NEEDED FOR PAIN    TRELEGY ELLIPTA 100-62.5-25 MCG/INH AEPB INHALE 1 PUFF INTO THE LUNGS DAILY    Urea 40 % LOTN Apply 1 application topically daily as needed (dry skin).     VENTOLIN HFA 108 (90 Base) MCG/ACT inhaler INHALE TWO PUFFS INTO THE LUNGS EVERY 4 HOURS AS NEEDED FOR WHEEZING OR SHORTNESS OF BREATH (Patient taking differently: Inhale 2 puffs into the lungs every 4  (four) hours as needed for wheezing or shortness of breath.)    verapamil (CALAN-SR) 120 MG CR tablet TAKE 1 TABLET BY MOUTH AT BEDTIME    No facility-administered encounter medications on file as of 08/10/2021.    Patient Active Problem List   Diagnosis Date Noted   Primary hypertension 06/05/2021   Atrial fibrillation with rapid ventricular response ([place]) 03/20/2021   Microcytic anemia 03/19/2021   Iron deficiency 03/19/2021   A-fib ([place]) 03/19/2021   Memory loss 11/21/2020   Polyp of colon    Acute blood loss anemia 08/09/2020   GI bleeding 08/04/2020   Depression, major, single episode, in partial remission ([place]) 06/22/2020   Atrial fibrillation with RVR ([place]) 06/13/2020   Chronic respiratory failure with hypoxia ([place]) 06/13/2020   Right hand pain 06/13/2020   Fall at home, initial encounter 06/13/2020   COPD (chronic obstructive pulmonary disease) ([place]) 06/13/2020   Bronchiectasis without complication ([place]) [id number]   Lymphedema 05/17/2019   Chronic venous insufficiency 05/17/2019   Swelling of limb 02/10/2018   Varicose veins of leg with swelling, bilateral 02/10/2018   Squamous cell carcinoma 09/22/2017   Hypoxia 09/27/2015   Abnormal CXR 09/27/2015   Asthma with allergic rhinitis 08/31/2015   Back ache 08/31/2015   Chronic interstitial cystitis 08/31/2015   Grief reaction 08/31/2015   Hyperlipidemia 06/26/2015   Migraine 06/26/2015   Asthma 06/26/2015   IBS (irritable bowel syndrome) 06/26/2015   Osteoporosis 06/26/2015   Fatigue 06/26/2015   Lumbar radiculopathy 04/27/2013   Urinary urgency 01/08/2012   Frequency of urination 01/08/2012    Conditions to be addressed/monitored: Fall Risk in patient with COPD  Patient Care Plan: Fall Risk (Adult)     Problem Identified: Fall Risk      Long-Range Goal: Absence of Fall and Fall-Related Injury   Start Date: 06/29/2021  Expected End Date: 10/27/2021  Priority: High  Note:   Current Barriers:  Risk for  Falls in patient with COPD.  Clinical Goal(s):  Over the next 120 days, patient will not experience falls or require emergent care due to fall related injuries.  Interventions:  Collaboration with Brita Romp Dionne Bucy, MD regarding development and update of comprehensive plan of care as evidenced by provider attestation and co-signature Inter-disciplinary care team collaboration (see longitudinal plan of care) Reviewed fall and safety prevention measures.  Advised to continue using an assistive device when ambulating. Denies recent falls but reports episodes of joint discomfort. Recalls receiving joint injections over a year ago in [place]. She is interested in reestablishing care. Prefers to be evaluated when the provider is covering the location at [place] versus traveling to [place]. Will contact [facility] to determine if a new referral is required prior to scheduling a visit. She continues to perform ADL's independently and has a very good support system. Agreed update the care management team if her functional status  changes and additional assistance is required.   Self-Care/Patient Goals:  Utilize appropriate assistive device with all ambulation Change positions slowly Wear secure non skid footwear with all ambulation Utilize home lighting for dim lit areas and ensure pathways are clear Contact the clinic with new concerns if needed   Follow Up Plan:  Will follow up next month      PLAN A member of the care management team will follow up with Mrs. Reis within the next month.    [name] [facility] Care Management [facility] [phone]

## 2021-08-13 NOTE — Patient Instructions (Addendum)
Thank you for allowing the Chronic Care Management team to participate in your care.   Goal Discussed: Patient Care Plan: Fall Risk (Adult)     Problem Identified: Fall Risk      Long-Range Goal: Absence of Fall and Fall-Related Injury   Start Date: 06/29/2021  Expected End Date: 10/27/2021  Priority: High  Note:   Current Barriers:  Risk for Falls in patient with COPD.  Clinical Goal(s):  Over the next 120 days, patient will not experience falls or require emergent care due to fall related injuries.  Interventions:  Collaboration with Brita Romp Dionne Bucy, MD regarding development and update of comprehensive plan of care as evidenced by provider attestation and co-signature Inter-disciplinary care team collaboration (see longitudinal plan of care) Reviewed fall and safety prevention measures.  Advised to continue using an assistive device when ambulating. Denies recent falls but reports episodes of joint discomfort. Recalls receiving joint injections over a year ago in [place]. She is interested in reestablishing care. Prefers to be evaluated when the provider is covering the location at [place] versus traveling to [place]. Will contact [facility] to determine if a new referral is required prior to scheduling a visit. She continues to perform ADL's independently and has a very good support system. Agreed update the care management team if her functional status changes and additional assistance is required.   Self-Care/Patient Goals:  Utilize appropriate assistive device with all ambulation Change positions slowly Wear secure non skid footwear with all ambulation Utilize home lighting for dim lit areas and ensure pathways are clear Contact the clinic with new concerns if needed   Follow Up Plan:  Will follow up next month      Jackie Turner verbalized understanding of the information discussed during the telephonic outreach. Declined need for mailed/printed  instructions. A member of the care management team will follow up next month.    [name] [facility] Care Management [facility] [phone]

## 2021-08-18 ENCOUNTER — Other Ambulatory Visit: Payer: Self-pay | Admitting: Family Medicine

## 2021-08-18 DIAGNOSIS — S22000D Wedge compression fracture of unspecified thoracic vertebra, subsequent encounter for fracture with routine healing: Secondary | ICD-10-CM

## 2021-08-18 DIAGNOSIS — M48061 Spinal stenosis, lumbar region without neurogenic claudication: Secondary | ICD-10-CM

## 2021-08-18 DIAGNOSIS — M5416 Radiculopathy, lumbar region: Secondary | ICD-10-CM

## 2021-08-18 NOTE — Telephone Encounter (Signed)
Requested medication (s) are due for refill today: yes  Requested medication (s) are on the active medication list: yes  Last refill:  07/16/21  Future visit scheduled: no  Notes to clinic:  med not delegated to NT to RF   Requested Prescriptions  Pending Prescriptions Disp Refills   traMADol (ULTRAM) 50 MG tablet [Pharmacy Med Name: TRAMADOL HCL 50 MG TAB] 90 tablet     Sig: TAKE 1 TABLET BY MOUTH EVERY 8 HOURS AS NEEDED FOR PAIN     Not Delegated - Analgesics:  Opioid Agonists Failed - 08/18/2021 11:01 AM      Failed - This refill cannot be delegated      Failed - Urine Drug Screen completed in last 360 days      Passed - Valid encounter within last 6 months    Recent Outpatient Visits           1 month ago    [place], PA-C   2 months ago Atrial fibrillation with RVR [facility])   [facility] [place], Dionne Bucy, MD   4 months ago Chronic obstructive pulmonary disease, unspecified COPD type [facility])   [facility] Jerrol Banana., MD   4 months ago Hospital discharge follow-up   [facility], Laurita Quint, FNP   5 months ago Atrial fibrillation with RVR [facility])   [place], Clearnce Sorrel, [place]       Future Appointments             In 2 weeks Martyn Ehrich, NP [place]

## 2021-08-22 ENCOUNTER — Other Ambulatory Visit: Payer: Self-pay | Admitting: Family Medicine

## 2021-08-22 DIAGNOSIS — M48061 Spinal stenosis, lumbar region without neurogenic claudication: Secondary | ICD-10-CM

## 2021-08-22 DIAGNOSIS — S22000D Wedge compression fracture of unspecified thoracic vertebra, subsequent encounter for fracture with routine healing: Secondary | ICD-10-CM

## 2021-08-22 DIAGNOSIS — J9621 Acute and chronic respiratory failure with hypoxia: Secondary | ICD-10-CM

## 2021-08-22 DIAGNOSIS — J4540 Moderate persistent asthma, uncomplicated: Secondary | ICD-10-CM

## 2021-08-22 DIAGNOSIS — J479 Bronchiectasis, uncomplicated: Secondary | ICD-10-CM

## 2021-08-22 DIAGNOSIS — M5416 Radiculopathy, lumbar region: Secondary | ICD-10-CM

## 2021-08-22 NOTE — Telephone Encounter (Signed)
Requested medications are due for refill today.  yes  Requested medications are on the active medications list.  yes  Last refill. 06/22/2020  Future visit scheduled.   no  Notes to clinic.  Prescription is expired.

## 2021-08-23 ENCOUNTER — Ambulatory Visit (INDEPENDENT_AMBULATORY_CARE_PROVIDER_SITE_OTHER): Payer: Medicare HMO

## 2021-08-23 DIAGNOSIS — Z9181 History of falling: Secondary | ICD-10-CM

## 2021-08-23 DIAGNOSIS — J449 Chronic obstructive pulmonary disease, unspecified: Secondary | ICD-10-CM

## 2021-08-23 NOTE — Chronic Care Management (AMB) (Signed)
Chronic Care Management   CCM RN Visit Note  08/23/2021 Name: Jackie Turner MRN: [id number] DOB: 08-05-1936  Subjective: Jackie Turner is a 85 y.o. year old female who is a primary care patient of Bacigalupo, Dionne Bucy, MD. The care management team was consulted for assistance with disease management and care coordination needs.    Engaged with patient by telephone for follow up visit in response to provider referral for case management and care coordination services.   Consent to Services:  The patient was given information about Chronic Care Management services, agreed to services, and gave verbal consent prior to initiation of services.  Please see initial visit note for detailed documentation.    Assessment: Review of patient past medical history, allergies, medications, health status, including review of consultants reports, laboratory and other test data, was performed as part of comprehensive evaluation and provision of chronic care management services.   SDOH (Social Determinants of Health) assessments and interventions performed:  No  CCM Care Plan  Allergies  Allergen Reactions   Nsaids Other (See Comments)    History of gastric ulcer   Amoxicillin Nausea Only and Rash   Ibandronic Acid Other (See Comments)    Muscle weakness - advised not to take  **BONIVA** - drug name   Actonel  [Risedronate] Other (See Comments)    Gastric ulcers   Antihistamines, Chlorpheniramine-Type    Aspirin     Gastric ulcer   Buspar [Buspirone] Other (See Comments)    Pt does not remember reaction    Butalbital-Aspirin-Caffeine Other (See Comments)    Other reaction(s): Unknown   Clarithromycin Other (See Comments)    Bloating  *BIAXIN*   Codeine    Gatifloxacin Other (See Comments)   Hydrocodone-Guaifenesin Nausea Only    CODICLEAR DH STYRUP    Moxifloxacin    Oxycodone Other (See Comments)    Dizziness   Penicillins Other (See Comments)   Risedronate Sodium      Gastric ulcer   Tylenol With Codeine #3  [Acetaminophen-Codeine]     Other reaction(s): Unknown   Raloxifene Other (See Comments)    Bloating Bloating  *EVISTA*    Outpatient Encounter Medications as of 08/23/2021  Medication Sig Note   traMADol (ULTRAM) 50 MG tablet TAKE 1 TABLET BY MOUTH EVERY 8 HOURS AS NEEDED FOR PAIN    albuterol (PROVENTIL) (2.5 MG/3ML) 0.083% nebulizer solution Take 3 mLs (2.5 mg total) by nebulization every 6 (six) hours as needed for wheezing or shortness of breath. 06/29/2021: Reports not currently using.   Calcium Carb-Cholecalciferol 600-400 MG-UNIT TABS Take by mouth.    dicyclomine (BENTYL) 10 MG capsule Take 1 capsule (10 mg total) by mouth 3 (three) times daily as needed for spasms.    doxepin (SINEQUAN) 10 MG capsule TAKE 3 CAPSULES BY MOUTH AT BEDTIME    feeding supplement, ENSURE ENLIVE, (ENSURE ENLIVE) LIQD Take 237 mLs by mouth 2 (two) times daily between meals.    ferrous gluconate (FERGON) 324 MG tablet Take 1 tablet (324 mg total) by mouth daily with breakfast.    fluticasone (FLONASE) 50 MCG/ACT nasal spray Place 2 sprays into both nostrils daily.    furosemide (LASIX) 40 MG tablet Take 1 tablet (40 mg total) by mouth daily as needed for edema.    nystatin (MYCOSTATIN) 100000 UNIT/ML suspension TAKE 5 MLS BY MOUTH 4 TIMES A DAY.    oxybutynin (DITROPAN XL) 15 MG 24 hr tablet Take 1 tablet (15 mg total) by mouth at bedtime.  OXYGEN Inhale 2 L into the lungs daily.    pentosan polysulfate (ELMIRON) 100 MG capsule Take 1 capsule (100 mg total) by mouth 2 (two) times daily as needed (urinary pain).    TRELEGY ELLIPTA 100-62.5-25 MCG/INH AEPB INHALE 1 PUFF INTO THE LUNGS DAILY    Urea 40 % LOTN Apply 1 application topically daily as needed (dry skin).     VENTOLIN HFA 108 (90 Base) MCG/ACT inhaler Inhale 2 puffs into the lungs every 4 (four) hours as needed for wheezing or shortness of breath.    verapamil (CALAN-SR) 120 MG CR tablet TAKE 1 TABLET BY  MOUTH AT BEDTIME    No facility-administered encounter medications on file as of 08/23/2021.    Patient Active Problem List   Diagnosis Date Noted   Primary hypertension 06/05/2021   Atrial fibrillation with rapid ventricular response ([place]) 03/20/2021   Microcytic anemia 03/19/2021   Iron deficiency 03/19/2021   A-fib ([place]) 03/19/2021   Memory loss 11/21/2020   Polyp of colon    Acute blood loss anemia 08/09/2020   GI bleeding 08/04/2020   Depression, major, single episode, in partial remission ([place]) 06/22/2020   Atrial fibrillation with RVR ([place]) 06/13/2020   Chronic respiratory failure with hypoxia ([place]) 06/13/2020   Right hand pain 06/13/2020   Fall at home, initial encounter 06/13/2020   COPD (chronic obstructive pulmonary disease) ([place]) 06/13/2020   Bronchiectasis without complication ([place]) [id number]   Lymphedema 05/17/2019   Chronic venous insufficiency 05/17/2019   Swelling of limb 02/10/2018   Varicose veins of leg with swelling, bilateral 02/10/2018   Squamous cell carcinoma 09/22/2017   Hypoxia 09/27/2015   Abnormal CXR 09/27/2015   Asthma with allergic rhinitis 08/31/2015   Back ache 08/31/2015   Chronic interstitial cystitis 08/31/2015   Grief reaction 08/31/2015   Hyperlipidemia 06/26/2015   Migraine 06/26/2015   Asthma 06/26/2015   IBS (irritable bowel syndrome) 06/26/2015   Osteoporosis 06/26/2015   Fatigue 06/26/2015   Lumbar radiculopathy 04/27/2013   Urinary urgency 01/08/2012   Frequency of urination 01/08/2012    Conditions to be addressed/monitored:COPD and Fall Risk  Patient Care Plan: COPD (Adult)     Problem Identified: Symptom Exacerbation (COPD)      Long-Range Goal: Symptom Exacerbation Prevented or Minimized   Start Date: 06/29/2021  Expected End Date: 10/27/2021  Priority: High  Note:   Current Barriers:  Chronic Care Management support and educational needs related to COPD.  Case Manager Clinical Goal(s): Over the next 120  days, patient will not require hospitalization due to complications related to COPD.  Interventions:  Collaboration with Brita Romp Dionne Bucy, MD regarding development and update of comprehensive plan of care as evidenced by provider attestation and co-signature Inter-disciplinary care team collaboration (see longitudinal plan of care) Reviewed medications and current treatment plan for COPD management. Continues taking medications and using oxygen as prescribed. Denies changes in activity tolerance. Encouraged to follow up with the Pulmonology team as advised. Reviewed importance of completing daily self assessments. Advised to continue assessing symptoms daily and notify a provider if symptoms persists. Reviewed worsening s/sx that require immediate medical attention.   Self-Care/Patient Goals: Self administer medications as prescribed Attend medical appointments as scheduled Assess symptoms daily Eliminate symptom triggers in the home Calls provider office for new concerns or questions   Follow Up Plan:  Will follow up within the next month    Patient Care Plan: Fall Risk (Adult)     Problem Identified: Fall Risk  Long-Range Goal: Absence of Fall and Fall-Related Injury   Start Date: 06/29/2021  Expected End Date: 10/27/2021  Priority: High  Note:   Current Barriers:  Risk for Falls  Clinical Goal(s):  Over the next 120 days, patient will not experience falls or require emergent care due to fall related injuries.  Interventions:  Collaboration with Brita Romp Dionne Bucy, MD regarding development and update of comprehensive plan of care as evidenced by provider attestation and co-signature Inter-disciplinary care team collaboration (see longitudinal plan of care) Reviewed fall and safety prevention measures.  Discussed importance of utilizing an assistive device when ambulating. She continues to perform ADL's independently. She has a very good support system. Denies  recent falls but reports bumping the back of her leg within the past few weeks. Reports a dime sized dark hardened, scab like area has been on the back of her leg for several months. Reports a portion of the hardened area came off when she bumped her leg. Denies pain, edema or drainage from the site. She is unsure is she will require a dermatology evaluation and requested to have the area evaluated in the clinic.  She is scheduled to be evaluated on 10/05/21 due to her preference for afternoon appointments. She is aware of indications for contacting the clinic if the area worsens before her visit.   Self-Care/Patient Goals:  Utilize appropriate assistive device with all ambulation Change positions slowly Wear secure non skid footwear with all ambulation Utilize home lighting for dim lit areas and ensure pathways are clear Contact the clinic with new concerns if needed  Follow Up Plan:  Will follow up next month       PLAN A member of the care management team will follow up within the next month.   [name] [facility] Care Management [facility] [phone]

## 2021-08-23 NOTE — Patient Instructions (Addendum)
Thank you for allowing the Chronic Care Management team to participate in your care.    Patient Care Plan: COPD (Adult)     Problem Identified: Symptom Exacerbation (COPD)      Long-Range Goal: Symptom Exacerbation Prevented or Minimized   Start Date: 06/29/2021  Expected End Date: 10/27/2021  Priority: High  Note:   Current Barriers:  Chronic Care Management support and educational needs related to COPD.  Case Manager Clinical Goal(s): Over the next 120 days, patient will not require hospitalization due to complications related to COPD.  Interventions:  Collaboration with Brita Romp Dionne Bucy, MD regarding development and update of comprehensive plan of care as evidenced by provider attestation and co-signature Inter-disciplinary care team collaboration (see longitudinal plan of care) Reviewed medications and current treatment plan for COPD management. Continues taking medications and using oxygen as prescribed. Denies changes in activity tolerance. Encouraged to follow up with the Pulmonology team as advised. Reviewed importance of completing daily self assessments. Advised to continue assessing symptoms daily and notify a provider if symptoms persists. Reviewed worsening s/sx that require immediate medical attention.   Self-Care/Patient Goals: Self administer medications as prescribed Attend medical appointments as scheduled Assess symptoms daily Eliminate symptom triggers in the home Calls provider office for new concerns or questions   Follow Up Plan:  Will follow up within the next month    Patient Care Plan: Fall Risk (Adult)     Problem Identified: Fall Risk      Long-Range Goal: Absence of Fall and Fall-Related Injury   Start Date: 06/29/2021  Expected End Date: 10/27/2021  Priority: High  Note:   Current Barriers:  Risk for Falls  Clinical Goal(s):  Over the next 120 days, patient will not experience falls or require emergent care due to fall related  injuries.  Interventions:  Collaboration with Brita Romp Dionne Bucy, MD regarding development and update of comprehensive plan of care as evidenced by provider attestation and co-signature Inter-disciplinary care team collaboration (see longitudinal plan of care) Reviewed fall and safety prevention measures.  Discussed importance of utilizing an assistive device when ambulating. She continues to perform ADL's independently. She has a very good support system. Denies recent falls but reports bumping the back of her leg within the past few weeks. Reports a dime sized dark hardened, scab like area has been on the back of her leg for several months. Reports a portion of the hardened area came off when she bumped her leg. Denies pain, edema or drainage from the site. She is unsure is she will require a dermatology evaluation and requested to have the area evaluated in the clinic.  She is scheduled to be evaluated on 10/05/21 due to her preference for afternoon appointments. She is aware of indications for contacting the clinic if the area worsens before her visit.   Self-Care/Patient Goals:  Utilize appropriate assistive device with all ambulation Change positions slowly Wear secure non skid footwear with all ambulation Utilize home lighting for dim lit areas and ensure pathways are clear Contact the clinic with new concerns if needed  Follow Up Plan:  Will follow up next month        Jackie Turner verbalized understanding of the information discussed during the telephonic outreach. Declined need for mailed/printed instructions. A member of the care management team will follow up within the next month.   Jackie Turner Health/[facility] Care Management [facility] [phone]

## 2021-08-23 NOTE — Telephone Encounter (Signed)
Requested medication (s) are due for refill today: signed 08/22/21  Requested medication (s) are on the active medication list: yes  Last refill:  08/22/21 #90 0 refills   Future visit scheduled: yes in 1 month   Notes to clinic:  not delegate per protocol. Signed 08/22/21 Prescription MME cannot be calculated for this prescription. Enter discrete sig details to calculate prescription MME     Requested Prescriptions  Pending Prescriptions Disp Refills   traMADol (ULTRAM) 50 MG tablet [Pharmacy Med Name: TRAMADOL HCL 50 MG TAB] 90 tablet     Sig: TAKE 1 TABLET BY MOUTH EVERY 8 HOURS AS NEEDED FOR PAIN     Not Delegated - Analgesics:  Opioid Agonists Failed - 08/22/2021 11:56 AM      Failed - This refill cannot be delegated      Failed - Urine Drug Screen completed in last 360 days      Passed - Valid encounter within last 6 months    Recent Outpatient Visits           1 month ago    [place], PA-C   2 months ago Atrial fibrillation with [facility])   [facility] [place] [place], Dionne Bucy, MD   4 months ago Chronic obstructive pulmonary disease, unspecified COPD type [facility])   [facility] Jerrol Banana., MD   4 months ago Hospital discharge follow-up   [facility], Laurita Quint, FNP   5 months ago Atrial fibrillation with [facility])   [place], Clearnce Sorrel, [place]       Future Appointments             In 2 weeks Martyn Ehrich, NP [facility]   In 1 month Caryn Section, Kirstie Peri, MD [facility], [place]

## 2021-08-31 ENCOUNTER — Ambulatory Visit: Payer: Self-pay

## 2021-08-31 DIAGNOSIS — J449 Chronic obstructive pulmonary disease, unspecified: Secondary | ICD-10-CM

## 2021-08-31 DIAGNOSIS — Z9181 History of falling: Secondary | ICD-10-CM

## 2021-08-31 NOTE — Chronic Care Management (AMB) (Signed)
Error. Please disregard

## 2021-08-31 NOTE — Chronic Care Management (AMB) (Signed)
Chronic Care Management   CCM RN Visit Note  08/31/2021 Name: Jackie Turner MRN: [id number] DOB: 02/08/1936  Subjective: Jackie Turner is a 85 y.o. year old female who is a primary care patient of Bacigalupo, Dionne Bucy, MD. The care management team was consulted for assistance with disease management and care coordination needs.    Engaged with patient by telephone for follow up visit in response to provider referral for case management and care coordination services.   Consent to Services:  The patient was given information about Chronic Care Management services, agreed to services, and gave verbal consent prior to initiation of services.  Please see initial visit note for detailed documentation.    Assessment: Review of patient past medical history, allergies, medications, health status, including review of consultants reports, laboratory and other test data, was performed as part of comprehensive evaluation and provision of chronic care management services.   SDOH (Social Determinants of Health) assessments and interventions performed:  No  CCM Care Plan  Allergies  Allergen Reactions   Nsaids Other (See Comments)    History of gastric ulcer   Amoxicillin Nausea Only and Rash   Ibandronic Acid Other (See Comments)    Muscle weakness - advised not to take  **BONIVA** - drug name   Actonel  [Risedronate] Other (See Comments)    Gastric ulcers   Antihistamines, Chlorpheniramine-Type    Aspirin     Gastric ulcer   Buspar [Buspirone] Other (See Comments)    Pt does not remember reaction    Butalbital-Aspirin-Caffeine Other (See Comments)    Other reaction(s): Unknown   Clarithromycin Other (See Comments)    Bloating  *BIAXIN*   Codeine    Gatifloxacin Other (See Comments)   Hydrocodone-Guaifenesin Nausea Only    CODICLEAR DH STYRUP    Moxifloxacin    Oxycodone Other (See Comments)    Dizziness   Penicillins Other (See Comments)   Risedronate Sodium      Gastric ulcer   Tylenol With Codeine #3  [Acetaminophen-Codeine]     Other reaction(s): Unknown   Raloxifene Other (See Comments)    Bloating Bloating  *EVISTA*    Outpatient Encounter Medications as of 08/31/2021  Medication Sig Note   traMADol (ULTRAM) 50 MG tablet TAKE 1 TABLET BY MOUTH EVERY 8 HOURS AS NEEDED FOR PAIN    albuterol (PROVENTIL) (2.5 MG/3ML) 0.083% nebulizer solution Take 3 mLs (2.5 mg total) by nebulization every 6 (six) hours as needed for wheezing or shortness of breath. 06/29/2021: Reports not currently using.   Calcium Carb-Cholecalciferol 600-400 MG-UNIT TABS Take by mouth.    dicyclomine (BENTYL) 10 MG capsule Take 1 capsule (10 mg total) by mouth 3 (three) times daily as needed for spasms.    doxepin (SINEQUAN) 10 MG capsule TAKE 3 CAPSULES BY MOUTH AT BEDTIME    feeding supplement, ENSURE ENLIVE, (ENSURE ENLIVE) LIQD Take 237 mLs by mouth 2 (two) times daily between meals.    ferrous gluconate (FERGON) 324 MG tablet Take 1 tablet (324 mg total) by mouth daily with breakfast.    fluticasone (FLONASE) 50 MCG/ACT nasal spray Place 2 sprays into both nostrils daily.    furosemide (LASIX) 40 MG tablet Take 1 tablet (40 mg total) by mouth daily as needed for edema.    nystatin (MYCOSTATIN) 100000 UNIT/ML suspension TAKE 5 MLS BY MOUTH 4 TIMES A DAY.    oxybutynin (DITROPAN XL) 15 MG 24 hr tablet Take 1 tablet (15 mg total) by mouth at bedtime.  OXYGEN Inhale 2 L into the lungs daily.    pentosan polysulfate (ELMIRON) 100 MG capsule Take 1 capsule (100 mg total) by mouth 2 (two) times daily as needed (urinary pain).    TRELEGY ELLIPTA 100-62.5-25 MCG/INH AEPB INHALE 1 PUFF INTO THE LUNGS DAILY    Urea 40 % LOTN Apply 1 application topically daily as needed (dry skin).     VENTOLIN HFA 108 (90 Base) MCG/ACT inhaler Inhale 2 puffs into the lungs every 4 (four) hours as needed for wheezing or shortness of breath.    verapamil (CALAN-SR) 120 MG CR tablet TAKE 1 TABLET BY  MOUTH AT BEDTIME    No facility-administered encounter medications on file as of 08/31/2021.    Patient Active Problem List   Diagnosis Date Noted   Primary hypertension 06/05/2021   Atrial fibrillation with rapid ventricular response ([place]) 03/20/2021   Microcytic anemia 03/19/2021   Iron deficiency 03/19/2021   A-fib ([place]) 03/19/2021   Memory loss 11/21/2020   Polyp of colon    Acute blood loss anemia 08/09/2020   GI bleeding 08/04/2020   Depression, major, single episode, in partial remission ([place]) 06/22/2020   Atrial fibrillation with RVR ([place]) 06/13/2020   Chronic respiratory failure with hypoxia ([place]) 06/13/2020   Right hand pain 06/13/2020   Fall at home, initial encounter 06/13/2020   COPD (chronic obstructive pulmonary disease) ([place]) 06/13/2020   Bronchiectasis without complication ([place]) [id number]   Lymphedema 05/17/2019   Chronic venous insufficiency 05/17/2019   Swelling of limb 02/10/2018   Varicose veins of leg with swelling, bilateral 02/10/2018   Squamous cell carcinoma 09/22/2017   Hypoxia 09/27/2015   Abnormal CXR 09/27/2015   Asthma with allergic rhinitis 08/31/2015   Back ache 08/31/2015   Chronic interstitial cystitis 08/31/2015   Grief reaction 08/31/2015   Hyperlipidemia 06/26/2015   Migraine 06/26/2015   Asthma 06/26/2015   IBS (irritable bowel syndrome) 06/26/2015   Osteoporosis 06/26/2015   Fatigue 06/26/2015   Lumbar radiculopathy 04/27/2013   Urinary urgency 01/08/2012   Frequency of urination 01/08/2012    Conditions to be addressed/monitored:COPD and Falls Patient Care Plan: COPD (Adult)     Problem Identified: Symptom Exacerbation (COPD)      Long-Range Goal: Symptom Exacerbation Prevented or Minimized   Start Date: 06/29/2021  Expected End Date: 10/27/2021  Priority: High  Note:   Current Barriers:  Chronic Care Management support and educational needs related to COPD.  Case Manager Clinical Goal(s): Over the next 120 days,  patient will not require hospitalization due to complications related to COPD.  Interventions:  Collaboration with Brita Romp Dionne Bucy, MD regarding development and update of comprehensive plan of care as evidenced by provider attestation and co-signature Inter-disciplinary care team collaboration (see longitudinal plan of care) Reviewed medications and current treatment plan for COPD management. Continues taking medications and using oxygen as prescribed. Denies changes in activity tolerance. Advised to continue using supplemental oxygen as advised. Advised to notify the clinic or Pulmonology team if she experiences symptoms/complications due to seasonal changes. Reviewed importance of completing daily self assessments. Advised to continue assessing symptoms daily and notify a provider if symptoms persists. Reviewed worsening s/sx that require immediate medical attention.   Self-Care/Patient Goals: Self administer medications as prescribed Attend medical appointments as scheduled Assess symptoms daily Eliminate symptom triggers in the home Calls provider office for new concerns or questions   Follow Up Plan:  Will follow up within the next month    Patient Care Plan: Fall Risk (Adult)  Problem Identified: Fall Risk      Long-Range Goal: Absence of Fall and Fall-Related Injury   Start Date: 06/29/2021  Expected End Date: 10/27/2021  Priority: High  Note:   Current Barriers:  Risk for Falls in patient with COPD.  Clinical Goal(s):  Over the next 120 days, patient will not experience falls or require emergent care due to fall related injuries.  Interventions:  Collaboration with Brita Romp Dionne Bucy, MD regarding development and update of comprehensive plan of care as evidenced by provider attestation and co-signature Inter-disciplinary care team collaboration (see longitudinal plan of care) Reviewed fall and safety prevention measures.  Advised to continue using an  assistive device when ambulating. Denies recent falls but reports episodes of joint discomfort. Recalls receiving joint injections over a year ago in [place]. She is interested in reestablishing care. Prefers to be evaluated when the provider is covering the location at [place] versus traveling to [place]. Will contact [facility] to determine if a new referral is required prior to scheduling a visit. She continues to perform ADL's independently and has a very good support system. Agreed update the care management team if her functional status changes and additional assistance is required. Update on 08/31/21: Recalls an injury to her wrist during a previous hospitalization. She is interested in reestablishing care with Ortho team that previously assisted her. Recalls being treated by Dr. Bobbye Charleston. Will follow up to determine if a new referral is required.   Self-Care/Patient Goals:  Utilize appropriate assistive device with all ambulation Change positions slowly Wear secure non skid footwear with all ambulation Utilize home lighting for dim lit areas and ensure pathways are clear Contact the clinic with new concerns if needed   Follow Up Plan:  Will follow up within the next month      PLAN A member of the care management team will follow up within the next month.   [name] Care Management [facility] [phone]

## 2021-09-07 ENCOUNTER — Other Ambulatory Visit: Payer: Self-pay

## 2021-09-07 ENCOUNTER — Encounter: Payer: Self-pay | Admitting: Primary Care

## 2021-09-07 ENCOUNTER — Ambulatory Visit: Payer: Medicare HMO | Admitting: Primary Care

## 2021-09-07 ENCOUNTER — Other Ambulatory Visit
Admission: RE | Admit: 2021-09-07 | Discharge: 2021-09-07 | Disposition: A | Discharge status: Home / Self Care | Payer: Medicare HMO | Attending: Primary Care | Admitting: Primary Care

## 2021-09-07 VITALS — BP 130/78 | HR 112 | Temp 98.2°F | Ht <= 58 in | Wt 119.4 lb

## 2021-09-07 DIAGNOSIS — R0602 Shortness of breath: Secondary | ICD-10-CM | POA: Diagnosis not present

## 2021-09-07 DIAGNOSIS — J449 Chronic obstructive pulmonary disease, unspecified: Secondary | ICD-10-CM | POA: Diagnosis not present

## 2021-09-07 DIAGNOSIS — I4811 Longstanding persistent atrial fibrillation: Secondary | ICD-10-CM

## 2021-09-07 DIAGNOSIS — J9611 Chronic respiratory failure with hypoxia: Secondary | ICD-10-CM

## 2021-09-07 LAB — CBC WITH DIFFERENTIAL/PLATELET
Abs Immature Granulocytes: 0.04 10*3/uL (ref 0.00–0.07)
Basophils Absolute: 0.1 10*3/uL (ref 0.0–0.1)
Basophils Relative: 1 %
Eosinophils Absolute: 0.3 10*3/uL (ref 0.0–0.5)
Eosinophils Relative: 4 %
HCT: 39.3 % (ref 36.0–46.0)
Hemoglobin: 12.4 g/dL (ref 12.0–15.0)
Immature Granulocytes: 1 %
Lymphocytes Relative: 11 %
Lymphs Abs: 0.9 10*3/uL (ref 0.7–4.0)
MCH: 28.2 pg (ref 26.0–34.0)
MCHC: 31.6 g/dL (ref 30.0–36.0)
MCV: 89.3 fL (ref 80.0–100.0)
Monocytes Absolute: 0.7 10*3/uL (ref 0.1–1.0)
Monocytes Relative: 8 %
Neutro Abs: 6.6 10*3/uL (ref 1.7–7.7)
Neutrophils Relative %: 75 %
Platelets: 359 10*3/uL (ref 150–400)
RBC: 4.4 MIL/uL (ref 3.87–5.11)
RDW: 16.2 % — ABNORMAL HIGH (ref 11.5–15.5)
WBC: 8.7 10*3/uL (ref 4.0–10.5)
nRBC: 0 % (ref 0.0–0.2)

## 2021-09-07 LAB — BRAIN NATRIURETIC PEPTIDE: B Natriuretic Peptide: 217.4 pg/mL — ABNORMAL HIGH (ref 0.0–100.0)

## 2021-09-07 LAB — BASIC METABOLIC PANEL
Anion gap: 9 (ref 5–15)
BUN: 21 mg/dL (ref 8–23)
CO2: 35 mmol/L — ABNORMAL HIGH (ref 22–32)
Calcium: 9.5 mg/dL (ref 8.9–10.3)
Chloride: 95 mmol/L — ABNORMAL LOW (ref 98–111)
Creatinine, Ser: 0.56 mg/dL (ref 0.44–1.00)
GFR, Estimated: >60 mL/min (ref >60)
Glucose, Bld: 143 mg/dL — ABNORMAL HIGH (ref 70–99)
Potassium: 4.6 mmol/L (ref 3.5–5.1)
Sodium: 139 mmol/L (ref 135–145)

## 2021-09-07 MED ORDER — TRELEGY ELLIPTA 200-62.5-25 MCG/INH IN AEPB: 200.0000 ug | INHALATION_SPRAY | Freq: Every day | RESPIRATORY_TRACT | 0 refills | Status: DC

## 2021-09-07 NOTE — Assessment & Plan Note (Signed)
-   Appears stable, she does report increased symptoms of fatigue without acute respiratory complains. She had very faint wheezing RUL on exam, otherwise clear. Recommend trial TRELEGY 24mcg one puff daily x 2 weeks. If no perceived benefit in breathing or energy level can resume regular strength dose. We will also check CBC to rule out anemia as causes of fatigue, BMET and BNP.

## 2021-09-07 NOTE — Assessment & Plan Note (Addendum)
-   HR 110 on exam, rhythm appears regular. She is not on anticoagulation d.t hx GIB - She has an apt with cardiology on November 21st

## 2021-09-07 NOTE — Progress Notes (Signed)
@Patient  ID: Jackie Turner, female    DOB: [birth year], 85 y.o.   MRN: [id number]  No chief complaint on file.   Referring provider: Virginia Crews, MD  HPI: 85 year old female, never smoked.  Past medical history significant for COPD Gold D, bronchiectasis, chronic respiratory failure with hypoxia (O2 dependent), A. Fib, lymphedema.  Patient of Dr. Mortimer Fries last seen in office on 09/19/2020.  Maintained on Trelegy and albuterol as needed.   Previous [facility] pulmonary encounter:  04/11/2021 Patient presents today for 48-month follow-up. She is doing well. She gets out of breath with exertion. She has congestion which she takes Mucinex as needed for with reported relief. No significant cough. She is compliant with Trelegy Ellipta inhaler. She rarely requires albuterol nebulizer. She reports that her right ankles swells, she takes Lasix as needed. Eliquis was on hold for GI bleed. Patient has been on oxygen since July 2021. Continues to benefit from 2 L supplemental oxygen, requested [employer]   09/07/2021- Interim hx  Patient presents today for 6 month follow-up/O2 recert. Reports low energy/fatigue this week. No new respiratory symptoms. She has chronic postnasal drip symptoms causing her to clear her throat often. She is using 2L oxygen continuously. She is on lasix 40mg  as needed for leg swelling, she takes this on average once a month. She is off Eliquis d/t history GI. Her stools are dark but felt to be d/t iron. She follows with gastroenterology. She is also following with hem/onc for microcytic anemia, last hgb 12.6. Denies f/c/s, shortness of breath, chest pain, sinus pressure/pain, N/V/D.     Allergies  Allergen Reactions   Nsaids Other (See Comments)    History of gastric ulcer   Amoxicillin Nausea Only and Rash   Ibandronic Acid Other (See Comments)    Muscle weakness - advised not to take  **BONIVA** - drug name   Actonel  [Risedronate] Other (See Comments)    Gastric ulcers    Antihistamines, Chlorpheniramine-Type    Aspirin     Gastric ulcer   Buspar [Buspirone] Other (See Comments)    Pt does not remember reaction    Butalbital-Aspirin-Caffeine Other (See Comments)    Other reaction(s): Unknown   Clarithromycin Other (See Comments)    Bloating  *BIAXIN*   Codeine    Gatifloxacin Other (See Comments)   Hydrocodone-Guaifenesin Nausea Only    CODICLEAR DH STYRUP    Moxifloxacin    Oxycodone Other (See Comments)    Dizziness   Penicillins Other (See Comments)   Risedronate Sodium     Gastric ulcer   Tylenol With Codeine #3  [Acetaminophen-Codeine]     Other reaction(s): Unknown   Raloxifene Other (See Comments)    Bloating Bloating  *EVISTA*    Immunization History  Administered Date(s) Administered   Fluad Quad(high Dose 65+) 11/10/2019, 11/15/2020   Influenza Split 09/25/2009   Influenza, High Dose Seasonal PF 09/27/2015, 10/21/2016, 09/22/2017, 09/25/2018   PFIZER(Purple Top)SARS-COV-2 Vaccination 01/06/2020, 01/27/2020, 10/30/2020   Pneumococcal Conjugate-13 02/09/2015   Pneumococcal Polysaccharide-23 10/07/2003   Td 05/14/2007    Past Medical History:  Diagnosis Date   Allergy    Asthma    Hyperlipidemia    Osteoporosis     Tobacco History: Social History   Tobacco Use  Smoking Status Never  Smokeless Tobacco Never   Counseling given: Not Answered   Outpatient Medications Prior to Visit  Medication Sig Dispense Refill   albuterol (PROVENTIL) (2.5 MG/3ML) 0.083% nebulizer solution Take 3 mLs (2.5 mg total)  by nebulization every 6 (six) hours as needed for wheezing or shortness of breath. 75 mL 12   Calcium Carb-Cholecalciferol 600-400 MG-UNIT TABS Take by mouth.     dicyclomine (BENTYL) 10 MG capsule Take 1 capsule (10 mg total) by mouth 3 (three) times daily as needed for spasms. 90 capsule 1   doxepin (SINEQUAN) 10 MG capsule TAKE 3 CAPSULES BY MOUTH AT BEDTIME 270 capsule 0   feeding supplement, ENSURE ENLIVE,  (ENSURE ENLIVE) LIQD Take 237 mLs by mouth 2 (two) times daily between meals. 237 mL 12   ferrous gluconate (FERGON) 324 MG tablet Take 1 tablet (324 mg total) by mouth daily with breakfast. 90 tablet 1   fluticasone (FLONASE) 50 MCG/ACT nasal spray Place 2 sprays into both nostrils daily. 16 g 5   furosemide (LASIX) 40 MG tablet Take 1 tablet (40 mg total) by mouth daily as needed for edema. 90 tablet 1   nystatin (MYCOSTATIN) 100000 UNIT/ML suspension TAKE 5 MLS BY MOUTH 4 TIMES A DAY. 60 mL 5   oxybutynin (DITROPAN XL) 15 MG 24 hr tablet Take 1 tablet (15 mg total) by mouth at bedtime. 90 tablet 1   OXYGEN Inhale 2 L into the lungs daily.     pentosan polysulfate (ELMIRON) 100 MG capsule Take 1 capsule (100 mg total) by mouth 2 (two) times daily as needed (urinary pain). 180 capsule 1   traMADol (ULTRAM) 50 MG tablet TAKE 1 TABLET BY MOUTH EVERY 8 HOURS AS NEEDED FOR PAIN 90 tablet 0   TRELEGY ELLIPTA 100-62.5-25 MCG/INH AEPB INHALE 1 PUFF INTO THE LUNGS DAILY 60 each 11   Urea 40 % LOTN Apply 1 application topically daily as needed (dry skin).   0   VENTOLIN HFA 108 (90 Base) MCG/ACT inhaler Inhale 2 puffs into the lungs every 4 (four) hours as needed for wheezing or shortness of breath. 18 g 3   verapamil (CALAN-SR) 120 MG CR tablet TAKE 1 TABLET BY MOUTH AT BEDTIME 90 tablet 1   No facility-administered medications prior to visit.      Review of Systems  Review of Systems  Constitutional:  Positive for fatigue.  HENT:  Positive for postnasal drip.   Respiratory:  Negative for cough, chest tightness, shortness of breath and wheezing.   Cardiovascular: Negative.  Negative for leg swelling.    Physical Exam  BP 130/78 (BP Location: Left Arm, Patient Position: Sitting, Cuff Size: Normal)   Pulse (!) 112   Temp 98.2 F (36.8 C) (Oral)   Ht 4' 9.5" (1.461 m)   Wt 119 lb 6.4 oz (54.2 kg)   SpO2 96%   BMI 25.39 kg/m  Physical Exam Constitutional:      General: She is not in  acute distress.    Appearance: Normal appearance. She is not ill-appearing.  HENT:     Head: Normocephalic and atraumatic.  Cardiovascular:     Rate and Rhythm: Regular rhythm. Tachycardia present.     Comments: HR 100-110; appears regular on exam Pulmonary:     Effort: Pulmonary effort is normal.     Breath sounds: Normal breath sounds. No wheezing, rhonchi or rales.  Musculoskeletal:        General: Normal range of motion.  Skin:    General: Skin is warm and dry.  Neurological:     General: No focal deficit present.     Mental Status: She is alert and oriented to person, place, and time. Mental status is at baseline.  Psychiatric:        Mood and Affect: Mood normal.        Behavior: Behavior normal.        Thought Content: Thought content normal.        Judgment: Judgment normal.     Lab Results:  CBC    Component Value Date/Time   WBC 8.7 09/07/2021 1543   RBC 4.40 09/07/2021 1543   HGB 12.4 09/07/2021 1543   HGB 11.0 (L) 06/05/2021 1532   HCT 39.3 09/07/2021 1543   HCT 35.5 06/05/2021 1532   PLT 359 09/07/2021 1543   PLT 308 06/05/2021 1532   MCV 89.3 09/07/2021 1543   MCV 80 06/05/2021 1532   MCH 28.2 09/07/2021 1543   MCHC 31.6 09/07/2021 1543   RDW 16.2 (H) 09/07/2021 1543   RDW 21.0 (H) 06/05/2021 1532   LYMPHSABS 0.9 09/07/2021 1543   LYMPHSABS 0.9 06/05/2021 1532   MONOABS 0.7 09/07/2021 1543   EOSABS 0.3 09/07/2021 1543   EOSABS 0.2 06/05/2021 1532   BASOSABS 0.1 09/07/2021 1543   BASOSABS 0.1 06/05/2021 1532    BMET    Component Value Date/Time   NA 140 07/20/2021 1455   NA 143 03/01/2021 1627   K 4.8 07/20/2021 1455   CL 97 (L) 07/20/2021 1455   CO2 36 (H) 07/20/2021 1455   GLUCOSE 112 (H) 07/20/2021 1455   BUN 24 (H) 07/20/2021 1455   BUN 23 03/01/2021 1627   CREATININE 0.79 07/20/2021 1455   CALCIUM 9.7 07/20/2021 1455   GFRNONAA >60 07/20/2021 1455   GFRAA >60 09/04/2020 1606    BNP    Component Value Date/Time   BNP 339.2 (H)  06/28/2021 1843    ProBNP No results found for: PROBNP  Imaging: No results found.   Assessment & Plan:   COPD (chronic obstructive pulmonary disease) ([place]) - Appears stable, she does report increased symptoms of fatigue without acute respiratory complains. She had very faint wheezing RUL on exam, otherwise clear. Recommend trial TRELEGY 215mcg one puff daily x 2 weeks. If no perceived benefit in breathing or energy level can resume regular strength dose. We will also check CBC to rule out anemia as causes of fatigue, BMET and BNP.   Chronic respiratory failure with hypoxia ([place]) - She continues to benefit from 2L supplemental oxygen - Patient re-qualified for oxygen today. O2 87% RA at rest, 96% on 2L   Martyn Ehrich, NP 09/07/2021

## 2021-09-07 NOTE — Patient Instructions (Signed)
Recommendations: - Start Trelegy 240mcg - 1 puff daily in morning x 2 weeks (if you notice improvement in breathing or energy level let us know and we can send in a new prescription. If no change can resume regular dose)  Orders: - Check O2 on room air  - Labs today (cbc, bmet, bnp)  Follow-up: - 3 months with Dr. Mortimer Fries or sooner if needed

## 2021-09-07 NOTE — Progress Notes (Signed)
Patient evaluated off of oxygen for 10 minutes, while on room air patient dropped to 83% and was brought back up to 93 on 2L of O2.   Patient was unable to complete walk test due to feeling weak and unwell.

## 2021-09-07 NOTE — Assessment & Plan Note (Signed)
-   She continues to benefit from 2L supplemental oxygen - Patient re-qualified for oxygen today. O2 87% RA at rest, 96% on 2L

## 2021-09-12 ENCOUNTER — Telehealth: Payer: Self-pay | Admitting: Primary Care

## 2021-09-12 ENCOUNTER — Ambulatory Visit: Payer: Self-pay

## 2021-09-12 DIAGNOSIS — J449 Chronic obstructive pulmonary disease, unspecified: Secondary | ICD-10-CM

## 2021-09-12 NOTE — Chronic Care Management (AMB) (Signed)
Chronic Care Management   CCM RN Visit Note  09/12/2021 Name: Jackie Turner MRN: [id number] DOB: Jul 18, 1936  Subjective: Jackie Turner is a 85 y.o. year old female who is a primary care patient of Bacigalupo, Dionne Bucy, MD. The care management team was consulted for assistance with disease management and care coordination needs.     Engaged with patient by telephone for follow up visit in response to provider referral for case management and care coordination services.   Consent to Services:  The patient was given information about Chronic Care Management services, agreed to services, and gave verbal consent prior to initiation of services.  Please see initial visit note for detailed documentation.    Assessment: Review of patient past medical history, allergies, medications, health status, including review of consultants reports, laboratory and other test data, was performed as part of comprehensive evaluation and provision of chronic care management services.   SDOH (Social Determinants of Health) assessments and interventions performed: No  CCM Care Plan  Allergies  Allergen Reactions   Nsaids Other (See Comments)    History of gastric ulcer   Amoxicillin Nausea Only and Rash   Ibandronic Acid Other (See Comments)    Muscle weakness - advised not to take  **BONIVA** - drug name   Actonel  [Risedronate] Other (See Comments)    Gastric ulcers   Antihistamines, Chlorpheniramine-Type    Aspirin     Gastric ulcer   Buspar [Buspirone] Other (See Comments)    Pt does not remember reaction    Butalbital-Aspirin-Caffeine Other (See Comments)    Other reaction(s): Unknown   Clarithromycin Other (See Comments)    Bloating  *BIAXIN*   Codeine    Gatifloxacin Other (See Comments)   Hydrocodone-Guaifenesin Nausea Only    CODICLEAR DH STYRUP    Moxifloxacin    Oxycodone Other (See Comments)    Dizziness   Penicillins Other (See Comments)   Risedronate Sodium      Gastric ulcer   Tylenol With Codeine #3  [Acetaminophen-Codeine]     Other reaction(s): Unknown   Raloxifene Other (See Comments)    Bloating Bloating  *EVISTA*    Outpatient Encounter Medications as of 09/12/2021  Medication Sig Note   albuterol (PROVENTIL) (2.5 MG/3ML) 0.083% nebulizer solution Take 3 mLs (2.5 mg total) by nebulization every 6 (six) hours as needed for wheezing or shortness of breath. 06/29/2021: Reports not currently using.   Calcium Carb-Cholecalciferol 600-400 MG-UNIT TABS Take by mouth.    dicyclomine (BENTYL) 10 MG capsule Take 1 capsule (10 mg total) by mouth 3 (three) times daily as needed for spasms.    doxepin (SINEQUAN) 10 MG capsule TAKE 3 CAPSULES BY MOUTH AT BEDTIME    feeding supplement, ENSURE ENLIVE, (ENSURE ENLIVE) LIQD Take 237 mLs by mouth 2 (two) times daily between meals.    ferrous gluconate (FERGON) 324 MG tablet Take 1 tablet (324 mg total) by mouth daily with breakfast.    fluticasone (FLONASE) 50 MCG/ACT nasal spray Place 2 sprays into both nostrils daily.    Fluticasone-Umeclidin-Vilant (TRELEGY ELLIPTA) 200-62.5-25 MCG/INH AEPB Inhale 200 mcg into the lungs daily.    furosemide (LASIX) 40 MG tablet Take 1 tablet (40 mg total) by mouth daily as needed for edema.    nystatin (MYCOSTATIN) 100000 UNIT/ML suspension TAKE 5 MLS BY MOUTH 4 TIMES A DAY.    oxybutynin (DITROPAN XL) 15 MG 24 hr tablet Take 1 tablet (15 mg total) by mouth at bedtime.    OXYGEN  Inhale 2 L into the lungs daily.    pentosan polysulfate (ELMIRON) 100 MG capsule Take 1 capsule (100 mg total) by mouth 2 (two) times daily as needed (urinary pain).    traMADol (ULTRAM) 50 MG tablet TAKE 1 TABLET BY MOUTH EVERY 8 HOURS AS NEEDED FOR PAIN    TRELEGY ELLIPTA 100-62.5-25 MCG/INH AEPB INHALE 1 PUFF INTO THE LUNGS DAILY    Urea 40 % LOTN Apply 1 application topically daily as needed (dry skin).     VENTOLIN HFA 108 (90 Base) MCG/ACT inhaler Inhale 2 puffs into the lungs every 4 (four)  hours as needed for wheezing or shortness of breath.    verapamil (CALAN-SR) 120 MG CR tablet TAKE 1 TABLET BY MOUTH AT BEDTIME    No facility-administered encounter medications on file as of 09/12/2021.    Patient Active Problem List   Diagnosis Date Noted   Primary hypertension 06/05/2021   Atrial fibrillation with rapid ventricular response ([place]) 03/20/2021   Microcytic anemia 03/19/2021   Iron deficiency 03/19/2021   A-fib ([place]) 03/19/2021   Memory loss 11/21/2020   Polyp of colon    Acute blood loss anemia 08/09/2020   GI bleeding 08/04/2020   Depression, major, single episode, in partial remission ([place]) 06/22/2020   Atrial fibrillation with RVR ([place]) 06/13/2020   Chronic respiratory failure with hypoxia ([place]) 06/13/2020   Right hand pain 06/13/2020   Fall at home, initial encounter 06/13/2020   COPD (chronic obstructive pulmonary disease) ([place]) 06/13/2020   Bronchiectasis without complication ([place]) 16/09/9603   Lymphedema 05/17/2019   Chronic venous insufficiency 05/17/2019   Swelling of limb 02/10/2018   Varicose veins of leg with swelling, bilateral 02/10/2018   Squamous cell carcinoma 09/22/2017   Hypoxia 09/27/2015   Abnormal CXR 09/27/2015   Asthma with allergic rhinitis 08/31/2015   Back ache 08/31/2015   Chronic interstitial cystitis 08/31/2015   Grief reaction 08/31/2015   Hyperlipidemia 06/26/2015   Migraine 06/26/2015   Asthma 06/26/2015   IBS (irritable bowel syndrome) 06/26/2015   Osteoporosis 06/26/2015   Fatigue 06/26/2015   Lumbar radiculopathy 04/27/2013   Urinary urgency 01/08/2012   Frequency of urination 01/08/2012    Conditions to be addressed/monitored:COPD Patient Care Plan: COPD (Adult)     Problem Identified: Symptom Exacerbation (COPD)      Long-Range Goal: Symptom Exacerbation Prevented or Minimized   Start Date: 06/29/2021  Expected End Date: 10/27/2021  Priority: High  Note:   Current Barriers:  Chronic Care Management support  and educational needs related to COPD.  Case Manager Clinical Goal(s): Over the next 120 days, patient will not require hospitalization due to complications related to COPD.  Interventions:  Collaboration with Brita Romp Dionne Bucy, MD regarding development and update of comprehensive plan of care as evidenced by provider attestation and co-signature Inter-disciplinary care team collaboration (see longitudinal plan of care) Reviewed medications and current treatment plan for COPD management. Continues taking medications and using oxygen as prescribed. Advised to continue using supplemental oxygen as advised. She notes a slight decrease in activity tolerance and reports episodes of fatigue over the past few days. She reports contacting the Pulmonology team and pending feedback. Thoroughly reviewed worsening s/sx that require immediate medical attention.  Reviewed pending appointments. She will be evaluated by a primary care provider on 10/05/21. Advised to complete outreach with the Pulmonology team if recommended. Advised to keep the care management team updated of transportation needs.    Self-Care/Patient Goals: Self administer medications as prescribed Attend medical appointments  as scheduled Assess symptoms daily Eliminate symptom triggers in the home Update the care management team if transportation assistance is required Calls provider office for new concerns or questions   Follow Up Plan:  Will follow up within the next month      PLAN A member of the care management team will follow up next month.    [name] Health/[facility] Care Management [facility] [phone]

## 2021-09-12 NOTE — Patient Instructions (Addendum)
Thank you for allowing the Chronic Care Management team to participate in your care.    Patient Care Plan: COPD (Adult)     Problem Identified: Symptom Exacerbation (COPD)      Long-Range Goal: Symptom Exacerbation Prevented or Minimized   Start Date: 06/29/2021  Expected End Date: 10/27/2021  Priority: High  Note:   Current Barriers:  Chronic Care Management support and educational needs related to COPD.  Case Manager Clinical Goal(s): Over the next 120 days, patient will not require hospitalization due to complications related to COPD.  Interventions:  Collaboration with Jackie Romp Dionne Bucy, MD regarding development and update of comprehensive plan of care as evidenced by provider attestation and co-signature Inter-disciplinary care team collaboration (see longitudinal plan of care) Reviewed medications and current treatment plan for COPD management. Continues taking medications and using oxygen as prescribed. Advised to continue using supplemental oxygen as advised. She notes a slight decrease in activity tolerance and reports episodes of fatigue over the past few days. She reports contacting the Pulmonology team and pending feedback. Thoroughly reviewed worsening s/sx that require immediate medical attention.  Reviewed pending appointments. She will be evaluated by a primary care provider on 10/05/21. Advised to complete outreach with the Pulmonology team if recommended. Advised to keep the care management team updated of transportation needs.    Self-Care/Patient Goals: Self administer medications as prescribed Attend medical appointments as scheduled Assess symptoms daily Eliminate symptom triggers in the home Update the care management team if transportation assistance is required Calls provider office for new concerns or questions   Follow Up Plan:  Will follow up within the next month      Jackie Turner verbalized understanding of the information discussed during the  telephonic outreach. Declined need for mailed/printed instructions. A member of the care management team will follow up next month.    Jackie Turner Health/[facility] Care Management [facility] [phone]

## 2021-09-13 NOTE — Telephone Encounter (Signed)
Lm for patient.  

## 2021-09-14 DIAGNOSIS — J449 Chronic obstructive pulmonary disease, unspecified: Secondary | ICD-10-CM

## 2021-09-14 NOTE — Telephone Encounter (Signed)
Her BNP was elevated, she should take lasix for 2-3 days. WBC was normal. Hgb was normal. Kidney function was normal.

## 2021-09-14 NOTE — Telephone Encounter (Signed)
Patient is aware of results and voiced her understanding.  Nothing further needed at this time.

## 2021-09-14 NOTE — Telephone Encounter (Signed)
Spoke to patient, who is requesting lab results from 09/07/2021.  Beth, please advise. Thanks

## 2021-09-21 NOTE — Patient Instructions (Addendum)
Thank you for allowing the Chronic Care Management team to participate in your care.   Patient Care Plan: COPD (Adult)     Problem Identified: Symptom Exacerbation (COPD)      Long-Range Goal: Symptom Exacerbation Prevented or Minimized   Start Date: 06/29/2021  Expected End Date: 10/27/2021  Priority: High  Note:   Current Barriers:  Chronic Care Management support and educational needs related to COPD.  Case Manager Clinical Goal(s): Over the next 120 days, patient will not require hospitalization due to complications related to COPD.  Interventions:  Collaboration with Jackie Romp Dionne Bucy, MD regarding development and update of comprehensive plan of care as evidenced by provider attestation and co-signature Inter-disciplinary care team collaboration (see longitudinal plan of care) Reviewed medications and current treatment plan for COPD management. Continues taking medications and using oxygen as prescribed. Denies changes in activity tolerance. Advised to continue using supplemental oxygen as advised. Advised to notify the clinic or Pulmonology team if she experiences symptoms/complications due to seasonal changes. Reviewed importance of completing daily self assessments. Advised to continue assessing symptoms daily and notify a provider if symptoms persists. Reviewed worsening s/sx that require immediate medical attention.   Self-Care/Patient Goals: Self administer medications as prescribed Attend medical appointments as scheduled Assess symptoms daily Eliminate symptom triggers in the home Calls provider office for new concerns or questions   Follow Up Plan:  Will follow up within the next month    Patient Care Plan: Fall Risk (Adult)     Problem Identified: Fall Risk      Long-Range Goal: Absence of Fall and Fall-Related Injury   Start Date: 06/29/2021  Expected End Date: 10/27/2021  Priority: High  Note:   Current Barriers:  Risk for Falls in patient with  COPD.  Clinical Goal(s):  Over the next 120 days, patient will not experience falls or require emergent care due to fall related injuries.  Interventions:  Collaboration with Jackie Romp Dionne Bucy, MD regarding development and update of comprehensive plan of care as evidenced by provider attestation and co-signature Inter-disciplinary care team collaboration (see longitudinal plan of care) Reviewed fall and safety prevention measures.  Advised to continue using an assistive device when ambulating. Denies recent falls but reports episodes of joint discomfort. Recalls receiving joint injections over a year ago in [place]. She is interested in reestablishing care. Prefers to be evaluated when the provider is covering the location at [place] versus traveling to [place]. Will contact [facility] to determine if a new referral is required prior to scheduling a visit. She continues to perform ADL's independently and has a very good support system. Agreed update the care management team if her functional status changes and additional assistance is required. Update on 08/31/21: Recalls an injury to her wrist during a previous hospitalization. She is interested in reestablishing care with Ortho team that previously assisted her. Recalls being treated by Dr. Bobbye Charleston. Will follow up to determine if a new referral is required.   Self-Care/Patient Goals:  Utilize appropriate assistive device with all ambulation Change positions slowly Wear secure non skid footwear with all ambulation Utilize home lighting for dim lit areas and ensure pathways are clear Contact the clinic with new concerns if needed   Follow Up Plan:  Will follow up within the next month      Jackie Turner verbalized understanding of the information discussed during the telephonic outreach. Declined need for mailed/printed instructions. A member of the care management team will follow up within the next month.  Jackie Turner Health/[facility] Care Management [facility] [phone]

## 2021-09-28 ENCOUNTER — Telehealth: Payer: Self-pay | Admitting: Family Medicine

## 2021-09-28 DIAGNOSIS — M5416 Radiculopathy, lumbar region: Secondary | ICD-10-CM

## 2021-09-28 DIAGNOSIS — M48061 Spinal stenosis, lumbar region without neurogenic claudication: Secondary | ICD-10-CM

## 2021-09-28 DIAGNOSIS — S22000D Wedge compression fracture of unspecified thoracic vertebra, subsequent encounter for fracture with routine healing: Secondary | ICD-10-CM

## 2021-09-28 MED ORDER — TRAMADOL HCL 50 MG PO TABS: 50.0000 mg | ORAL_TABLET | Freq: Three times a day (TID) | ORAL | 5 refills | Status: AC | PRN

## 2021-09-28 NOTE — Telephone Encounter (Signed)
[facility] faxed refill request for the following medications:  traMADol (ULTRAM) 50 MG tablet  Last Rx: 08/22/21 LOV: 07/12/21 with Simona Huh NOV: 10/05/21 with Dr. Caryn Section Please advise. Thanks TNP

## 2021-10-03 ENCOUNTER — Ambulatory Visit (INDEPENDENT_AMBULATORY_CARE_PROVIDER_SITE_OTHER): Payer: Medicare HMO

## 2021-10-03 DIAGNOSIS — J449 Chronic obstructive pulmonary disease, unspecified: Secondary | ICD-10-CM

## 2021-10-03 DIAGNOSIS — L989 Disorder of the skin and subcutaneous tissue, unspecified: Secondary | ICD-10-CM

## 2021-10-03 DIAGNOSIS — Z9181 History of falling: Secondary | ICD-10-CM

## 2021-10-03 DIAGNOSIS — R234 Changes in skin texture: Secondary | ICD-10-CM

## 2021-10-03 NOTE — Chronic Care Management (AMB) (Signed)
Chronic Care Management   CCM RN Visit Note  10/03/2021 Name: Jackie Turner MRN: [id number] DOB: 1935-12-23  Subjective: Jackie Turner is a 85 y.o. year old female who is a primary care patient of Bacigalupo, Dionne Bucy, MD. The care management team was consulted for assistance with disease management and care coordination needs.    Engaged with patient by telephone for follow up visit in response to provider referral for case management and/or care coordination services.   Consent to Services:  The patient was given information about Chronic Care Management services, agreed to services, and gave verbal consent prior to initiation of services.  Please see initial visit note for detailed documentation.   Patient agreed to services and verbal consent obtained.   Assessment: Review of patient past medical history, allergies, medications, health status, including review of consultants reports, laboratory and other test data, was performed as part of comprehensive evaluation and provision of chronic care management services.   SDOH (Social Determinants of Health) assessments and interventions performed:    CCM Care Plan  Allergies  Allergen Reactions   Nsaids Other (See Comments)    History of gastric ulcer   Amoxicillin Nausea Only and Rash   Ibandronic Acid Other (See Comments)    Muscle weakness - advised not to take  **BONIVA** - drug name   Actonel  [Risedronate] Other (See Comments)    Gastric ulcers   Antihistamines, Chlorpheniramine-Type    Aspirin     Gastric ulcer   Buspar [Buspirone] Other (See Comments)    Pt does not remember reaction    Butalbital-Aspirin-Caffeine Other (See Comments)    Other reaction(s): Unknown   Clarithromycin Other (See Comments)    Bloating  *BIAXIN*   Codeine    Gatifloxacin Other (See Comments)   Hydrocodone-Guaifenesin Nausea Only    CODICLEAR DH STYRUP    Moxifloxacin    Oxycodone Other (See Comments)    Dizziness    Penicillins Other (See Comments)   Risedronate Sodium     Gastric ulcer   Tylenol With Codeine #3  [Acetaminophen-Codeine]     Other reaction(s): Unknown   Raloxifene Other (See Comments)    Bloating Bloating  *EVISTA*    Outpatient Encounter Medications as of 10/03/2021  Medication Sig Note   albuterol (PROVENTIL) (2.5 MG/3ML) 0.083% nebulizer solution Take 3 mLs (2.5 mg total) by nebulization every 6 (six) hours as needed for wheezing or shortness of breath. 06/29/2021: Reports not currently using.   Calcium Carb-Cholecalciferol 600-400 MG-UNIT TABS Take by mouth.    dicyclomine (BENTYL) 10 MG capsule Take 1 capsule (10 mg total) by mouth 3 (three) times daily as needed for spasms.    doxepin (SINEQUAN) 10 MG capsule TAKE 3 CAPSULES BY MOUTH AT BEDTIME    feeding supplement, ENSURE ENLIVE, (ENSURE ENLIVE) LIQD Take 237 mLs by mouth 2 (two) times daily between meals.    ferrous gluconate (FERGON) 324 MG tablet Take 1 tablet (324 mg total) by mouth daily with breakfast.    fluticasone (FLONASE) 50 MCG/ACT nasal spray Place 2 sprays into both nostrils daily.    Fluticasone-Umeclidin-Vilant (TRELEGY ELLIPTA) 200-62.5-25 MCG/INH AEPB Inhale 200 mcg into the lungs daily.    furosemide (LASIX) 40 MG tablet Take 1 tablet (40 mg total) by mouth daily as needed for edema.    nystatin (MYCOSTATIN) 100000 UNIT/ML suspension TAKE 5 MLS BY MOUTH 4 TIMES A DAY.    oxybutynin (DITROPAN XL) 15 MG 24 hr tablet Take 1 tablet (15 mg  total) by mouth at bedtime.    OXYGEN Inhale 2 L into the lungs daily.    pentosan polysulfate (ELMIRON) 100 MG capsule Take 1 capsule (100 mg total) by mouth 2 (two) times daily as needed (urinary pain).    traMADol (ULTRAM) 50 MG tablet Take 1 tablet (50 mg total) by mouth every 8 (eight) hours as needed. for pain    TRELEGY ELLIPTA 100-62.5-25 MCG/INH AEPB INHALE 1 PUFF INTO THE LUNGS DAILY    Urea 40 % LOTN Apply 1 application topically daily as needed (dry skin).      VENTOLIN HFA 108 (90 Base) MCG/ACT inhaler Inhale 2 puffs into the lungs every 4 (four) hours as needed for wheezing or shortness of breath.    verapamil (CALAN-SR) 120 MG CR tablet TAKE 1 TABLET BY MOUTH AT BEDTIME    No facility-administered encounter medications on file as of 10/03/2021.    Patient Active Problem List   Diagnosis Date Noted   Primary hypertension 06/05/2021   Atrial fibrillation with rapid ventricular response ([place]) 03/20/2021   Microcytic anemia 03/19/2021   Iron deficiency 03/19/2021   A-fib ([place]) 03/19/2021   Memory loss 11/21/2020   Polyp of colon    Acute blood loss anemia 08/09/2020   GI bleeding 08/04/2020   Depression, major, single episode, in partial remission ([place]) 06/22/2020   Atrial fibrillation with RVR ([place]) 06/13/2020   Chronic respiratory failure with hypoxia ([place]) 06/13/2020   Right hand pain 06/13/2020   Fall at home, initial encounter 06/13/2020   COPD (chronic obstructive pulmonary disease) ([place]) 06/13/2020   Bronchiectasis without complication ([place]) 16/06/3709   Lymphedema 05/17/2019   Chronic venous insufficiency 05/17/2019   Swelling of limb 02/10/2018   Varicose veins of leg with swelling, bilateral 02/10/2018   Squamous cell carcinoma 09/22/2017   Hypoxia 09/27/2015   Abnormal CXR 09/27/2015   Asthma with allergic rhinitis 08/31/2015   Back ache 08/31/2015   Chronic interstitial cystitis 08/31/2015   Grief reaction 08/31/2015   Hyperlipidemia 06/26/2015   Migraine 06/26/2015   Asthma 06/26/2015   IBS (irritable bowel syndrome) 06/26/2015   Osteoporosis 06/26/2015   Fatigue 06/26/2015   Lumbar radiculopathy 04/27/2013   Urinary urgency 01/08/2012   Frequency of urination 01/08/2012    Conditions to be addressed/monitored:COPD and Fall Risk Patient Care Plan: COPD (Adult)     Problem Identified: Symptom Exacerbation (COPD)      Long-Range Goal: Symptom Exacerbation Prevented or Minimized   Start Date: 06/29/2021   Expected End Date: 10/27/2021  Priority: High  Note:   Current Barriers:  Chronic Care Management support and educational needs related to COPD.  Case Manager Clinical Goal(s): Over the next 120 days, patient will not require hospitalization due to complications related to COPD.  Interventions:  Collaboration with Brita Romp Dionne Bucy, MD regarding development and update of comprehensive plan of care as evidenced by provider attestation and co-signature Inter-disciplinary care team collaboration (see longitudinal plan of care) Reviewed medications and current treatment plan for COPD management. Reports compliance with medications. Using supplemental oxygen as advised. Reports decreased activity tolerance and shortness of breath with minimal exertion. Denies episodes at rest. Expressed concerns that she may have had a sinus infection. Denies cough, congestion or shortness of breath at time of call. She will be evaluated in the clinic on 10/05/21. Verbalized awareness of need to seek medical attention if symptoms worsen prior to her clinic visit. Thoroughly reviewed worsening s/sx that require immediate medical attention.   Self-Care/Patient Goals: Self administer  medications as prescribed Attend medical appointments as scheduled Assess symptoms daily Eliminate symptom triggers in the home Calls provider office for new concerns or questions     Patient Care Plan: Fall Risk (Adult)     Problem Identified: Fall Risk      Long-Range Goal: Absence of Fall and Fall-Related Injury   Start Date: 06/29/2021  Expected End Date: 10/27/2021  Priority: High  Note:   Current Barriers:  Risk for Falls in patient with COPD.  Clinical Goal(s):  Over the next 120 days, patient will not experience falls or require emergent care due to fall related injuries.  Interventions:  Collaboration with Brita Romp Dionne Bucy, MD regarding development and update of comprehensive plan of care as evidenced  by provider attestation and co-signature. Inter-disciplinary care team collaboration (see longitudinal plan of care) Reviewed fall and safety prevention measures.  Reports compliance with safety recommendations. She continues to experience joint discomfort and numbness d/t previous wrist injury. Denies recent falls. Continues to perform ADL's independently. Discussed availability of providers in the [place] area. She prefers to reestablish contact with providers that were previously following her in [place] and [place]. Requested assistance with determining if the providers rotate to clinics closer to [place]. She is willing to be evaluated in [place] if her previous providers are available. Will follow up to determine if providers are available.   Self-Care/Patient Goals:  Utilize appropriate assistive device with all ambulation Change positions slowly Wear secure non skid footwear with all ambulation Utilize home lighting for dim lit areas and ensure pathways are clear Contact the clinic with questions and new concerns if needed   Follow Up Plan:  Will follow up within the next month       PLAN A member of the care management team will follow up next month.   [name] Health/[facility] Care Management [facility] [phone]

## 2021-10-03 NOTE — Patient Instructions (Addendum)
Thank you for allowing the Chronic Care Management team to participate in your care.    Patient Care Plan: COPD (Adult)     Problem Identified: Symptom Exacerbation (COPD)      Long-Range Goal: Symptom Exacerbation Prevented or Minimized   Start Date: 06/29/2021  Expected End Date: 10/27/2021  Priority: High  Note:   Current Barriers:  Chronic Care Management support and educational needs related to COPD.  Case Manager Clinical Goal(s): Over the next 120 days, patient will not require hospitalization due to complications related to COPD.  Interventions:  Collaboration with Brita Romp Dionne Bucy, MD regarding development and update of comprehensive plan of care as evidenced by provider attestation and co-signature Inter-disciplinary care team collaboration (see longitudinal plan of care) Reviewed medications and current treatment plan for COPD management. Reports compliance with medications. Using supplemental oxygen as advised. Reports decreased activity tolerance and shortness of breath with minimal exertion. Denies episodes at rest. Expressed concerns that she may have had a sinus infection. Denies cough, congestion or shortness of breath at time of call. She will be evaluated in the clinic on 10/05/21. Verbalized awareness of need to seek medical attention if symptoms worsen prior to her clinic visit. Thoroughly reviewed worsening s/sx that require immediate medical attention.   Self-Care/Patient Goals: Self administer medications as prescribed Attend medical appointments as scheduled Assess symptoms daily Eliminate symptom triggers in the home Calls provider office for new concerns or questions     Patient Care Plan: Fall Risk (Adult)     Problem Identified: Fall Risk      Long-Range Goal: Absence of Fall and Fall-Related Injury   Start Date: 06/29/2021  Expected End Date: 10/27/2021  Priority: High  Note:   Current Barriers:  Risk for Falls in patient with  COPD.  Clinical Goal(s):  Over the next 120 days, patient will not experience falls or require emergent care due to fall related injuries.  Interventions:  Collaboration with Brita Romp Dionne Bucy, MD regarding development and update of comprehensive plan of care as evidenced by provider attestation and co-signature. Inter-disciplinary care team collaboration (see longitudinal plan of care) Reviewed fall and safety prevention measures.  Reports compliance with safety recommendations. She continues to experience joint discomfort and numbness d/t previous wrist injury. Denies recent falls. Continues to perform ADL's independently. Discussed availability of providers in the [place] area. She prefers to reestablish contact with providers that were previously following her in [place] and [place]. Requested assistance with determining if the providers rotate to clinics closer to [place]. She is willing to be evaluated in [place] if her previous providers are available. Will follow up to determine if providers are available.   Self-Care/Patient Goals:  Utilize appropriate assistive device with all ambulation Change positions slowly Wear secure non skid footwear with all ambulation Utilize home lighting for dim lit areas and ensure pathways are clear Contact the clinic with questions and new concerns if needed   Follow Up Plan:  Will follow up within the next month       Jackie Turner verbalized understanding of the information discussed during the telephonic outreach. Declined need for mailed/printed instructions. A member of the care management team will follow up next month.   [name] Care Management [facility] [phone]

## 2021-10-05 ENCOUNTER — Ambulatory Visit (INDEPENDENT_AMBULATORY_CARE_PROVIDER_SITE_OTHER): Payer: Medicare HMO | Admitting: Family Medicine

## 2021-10-05 ENCOUNTER — Other Ambulatory Visit: Payer: Self-pay

## 2021-10-05 ENCOUNTER — Encounter: Payer: Self-pay | Admitting: Family Medicine

## 2021-10-05 VITALS — BP 147/92 | HR 126 | Wt 118.0 lb

## 2021-10-05 DIAGNOSIS — Z23 Encounter for immunization: Secondary | ICD-10-CM | POA: Diagnosis not present

## 2021-10-05 DIAGNOSIS — J0100 Acute maxillary sinusitis, unspecified: Secondary | ICD-10-CM | POA: Diagnosis not present

## 2021-10-05 MED ORDER — AZITHROMYCIN 250 MG PO TABS: ORAL_TABLET | ORAL | 0 refills | Status: AC

## 2021-10-05 NOTE — Progress Notes (Signed)
Established patient visit   Patient: Jackie Turner   DOB: 1936/09/06   85 y.o. Female  MRN: [id number] Visit Date: 10/05/2021  Today's healthcare provider: Lelon Huh, MD   Chief Complaint  Patient presents with   Sinusitis   Subjective    Sinusitis This is a new problem. Episode onset: Started a few weeks ago. The problem has been gradually worsening since onset. There has been no fever. Associated symptoms include congestion, coughing, headaches, shortness of breath and sinus pressure. Pertinent negatives include no chills, ear pain, sneezing, sore throat or swollen glands. Past treatments include oral decongestants. The treatment provided mild relief.   Patient is here concerning a new lesion on the back of her leg.    Medications: Outpatient Medications Prior to Visit  Medication Sig   albuterol (PROVENTIL) (2.5 MG/3ML) 0.083% nebulizer solution Take 3 mLs (2.5 mg total) by nebulization every 6 (six) hours as needed for wheezing or shortness of breath.   Calcium Carb-Cholecalciferol 600-400 MG-UNIT TABS Take by mouth.   dicyclomine (BENTYL) 10 MG capsule Take 1 capsule (10 mg total) by mouth 3 (three) times daily as needed for spasms.   doxepin (SINEQUAN) 10 MG capsule TAKE 3 CAPSULES BY MOUTH AT BEDTIME   feeding supplement, ENSURE ENLIVE, (ENSURE ENLIVE) LIQD Take 237 mLs by mouth 2 (two) times daily between meals.   ferrous gluconate (FERGON) 324 MG tablet Take 1 tablet (324 mg total) by mouth daily with breakfast.   fluticasone (FLONASE) 50 MCG/ACT nasal spray Place 2 sprays into both nostrils daily.   Fluticasone-Umeclidin-Vilant (TRELEGY ELLIPTA) 200-62.5-25 MCG/INH AEPB Inhale 200 mcg into the lungs daily.   furosemide (LASIX) 40 MG tablet Take 1 tablet (40 mg total) by mouth daily as needed for edema.   nystatin (MYCOSTATIN) 100000 UNIT/ML suspension TAKE 5 MLS BY MOUTH 4 TIMES A DAY.   oxybutynin (DITROPAN XL) 15 MG 24 hr tablet Take 1 tablet (15 mg  total) by mouth at bedtime.   OXYGEN Inhale 2 L into the lungs daily.   pentosan polysulfate (ELMIRON) 100 MG capsule Take 1 capsule (100 mg total) by mouth 2 (two) times daily as needed (urinary pain).   traMADol (ULTRAM) 50 MG tablet Take 1 tablet (50 mg total) by mouth every 8 (eight) hours as needed. for pain   TRELEGY ELLIPTA 100-62.5-25 MCG/INH AEPB INHALE 1 PUFF INTO THE LUNGS DAILY   Urea 40 % LOTN Apply 1 application topically daily as needed (dry skin).    VENTOLIN HFA 108 (90 Base) MCG/ACT inhaler Inhale 2 puffs into the lungs every 4 (four) hours as needed for wheezing or shortness of breath.   verapamil (CALAN-SR) 120 MG CR tablet TAKE 1 TABLET BY MOUTH AT BEDTIME   No facility-administered medications prior to visit.    Review of Systems  Constitutional:  Negative for appetite change, chills, fatigue and fever.  HENT:  Positive for congestion, postnasal drip, sinus pressure and sinus pain. Negative for ear discharge, ear pain, hearing loss, rhinorrhea, sneezing, sore throat and trouble swallowing.   Eyes: Negative.   Respiratory:  Positive for cough, shortness of breath and wheezing. Negative for chest tightness.   Cardiovascular:  Negative for chest pain and palpitations.  Gastrointestinal:  Negative for abdominal pain, nausea and vomiting.  Neurological:  Positive for headaches. Negative for dizziness, weakness and light-headedness.  Hematological:  Negative for adenopathy.      Objective    BP (!) 147/92 (BP Location: Left Arm, Patient Position:  Sitting, Cuff Size: Normal)   Pulse (!) 126   Wt 118 lb (53.5 kg)   SpO2 98% Comment: 2L O2  BMI 25.09 kg/m    Physical Exam  General Appearance:     Well developed, well nourished female, alert, cooperative, in no acute distress  HENT:   Left maxillary sinus tender and nasal mucosa congested  Eyes:    PERRL, conjunctiva/corneas clear, EOM's intact       Lungs:     Clear to auscultation bilaterally, respirations  unlabored  Heart:    Tachycardic. Irregularly irregular rhythm. No murmurs, rubs, or gallops.    Neurologic:   Awake, alert, oriented x 3. No apparent focal neurological           defect.         Assessment & Plan     1. Acute maxillary sinusitis, recurrence not specified  - azithromycin (ZITHROMAX) 250 MG tablet; Take 2 tablets on day 1, then 1 tablet daily on days 2 through 5  Dispense: 6 tablet; Refill: 0  Call if symptoms change or if not rapidly improving.    2. Need for influenza vaccination  - Flu Vaccine QUAD High Dose(Fluad)         The entirety of the information documented in the History of Present Illness, Review of Systems and Physical Exam were personally obtained by me. Portions of this information were initially documented by the CMA and reviewed by me for thoroughness and accuracy.     Lelon Huh, MD  [facility] [phone] (phone) [phone] (fax)  [place]

## 2021-10-15 DIAGNOSIS — J449 Chronic obstructive pulmonary disease, unspecified: Secondary | ICD-10-CM | POA: Diagnosis not present

## 2021-10-19 ENCOUNTER — Other Ambulatory Visit: Payer: Self-pay | Admitting: Family Medicine

## 2021-10-19 DIAGNOSIS — F324 Major depressive disorder, single episode, in partial remission: Secondary | ICD-10-CM

## 2021-10-19 NOTE — Telephone Encounter (Signed)
Requested Prescriptions  Pending Prescriptions Disp Refills  . doxepin (SINEQUAN) 10 MG capsule [Pharmacy Med Name: DOXEPIN HCL 10 MG CAP] 270 capsule 0    Sig: TAKE 3 CAPSULES BY MOUTH AT BEDTIME     Psychiatry:  Antidepressants - Heterocyclics (TCAs) Passed - 10/19/2021 11:57 AM      Passed - Completed PHQ-2 or PHQ-9 in the last 360 days      Passed - Valid encounter within last 6 months    Recent Outpatient Visits          2 weeks ago Acute maxillary sinusitis, recurrence not specified   [facility] Birdie Sons, MD   3 months ago Dry heaves   [place], PA-C   4 months ago Atrial fibrillation with RVR [facility])   [facility], Dionne Bucy, MD   6 months ago Chronic obstructive pulmonary disease, unspecified COPD type [facility])   [facility] Jerrol Banana., MD   6 months ago Hospital discharge follow-up   [facility], Laurita Quint, FNP

## 2021-10-24 ENCOUNTER — Ambulatory Visit (INDEPENDENT_AMBULATORY_CARE_PROVIDER_SITE_OTHER): Payer: Medicare HMO

## 2021-10-24 DIAGNOSIS — Z9181 History of falling: Secondary | ICD-10-CM

## 2021-10-24 DIAGNOSIS — J449 Chronic obstructive pulmonary disease, unspecified: Secondary | ICD-10-CM

## 2021-10-24 NOTE — Chronic Care Management (AMB) (Signed)
Chronic Care Management   CCM RN Visit Note  10/24/2021 Name: Jackie Turner MRN: [id number] DOB: [birth year]  Subjective: Jackie Turner is a 85 y.o. year old female who is a primary care patient of Bacigalupo, Dionne Bucy, MD. The care management team was consulted for assistance with disease management and care coordination needs.    Engaged with patient by telephone for follow up visit in response to provider referral for case management and care coordination services.   Consent to Services:  The patient was given information about Chronic Care Management services, agreed to services, and gave verbal consent prior to initiation of services.  Please see initial visit note for detailed documentation.    Assessment: Review of patient past medical history, allergies, medications, health status, including review of consultants reports, laboratory and other test data, was performed as part of comprehensive evaluation and provision of chronic care management services.   SDOH (Social Determinants of Health) assessments and interventions performed: No  CCM Care Plan  Allergies  Allergen Reactions   Nsaids Other (See Comments)    History of gastric ulcer   Amoxicillin Nausea Only and Rash   Ibandronic Acid Other (See Comments)    Muscle weakness - advised not to take  **BONIVA** - drug name   Actonel  [Risedronate] Other (See Comments)    Gastric ulcers   Antihistamines, Chlorpheniramine-Type    Aspirin     Gastric ulcer   Buspar [Buspirone] Other (See Comments)    Pt does not remember reaction    Butalbital-Aspirin-Caffeine Other (See Comments)    Other reaction(s): Unknown   Clarithromycin Other (See Comments)    Bloating  *BIAXIN*   Codeine    Gatifloxacin Other (See Comments)   Hydrocodone-Guaifenesin Nausea Only    CODICLEAR DH STYRUP    Moxifloxacin    Oxycodone Other (See Comments)    Dizziness   Penicillins Other (See Comments)   Risedronate Sodium      Gastric ulcer   Tylenol With Codeine #3  [Acetaminophen-Codeine]     Other reaction(s): Unknown   Raloxifene Other (See Comments)    Bloating Bloating  *EVISTA*    Outpatient Encounter Medications as of 10/24/2021  Medication Sig Note   albuterol (PROVENTIL) (2.5 MG/3ML) 0.083% nebulizer solution Take 3 mLs (2.5 mg total) by nebulization every 6 (six) hours as needed for wheezing or shortness of breath. 06/29/2021: Reports not currently using.   Calcium Carb-Cholecalciferol 600-400 MG-UNIT TABS Take by mouth.    dicyclomine (BENTYL) 10 MG capsule Take 1 capsule (10 mg total) by mouth 3 (three) times daily as needed for spasms.    doxepin (SINEQUAN) 10 MG capsule TAKE 3 CAPSULES BY MOUTH AT BEDTIME    feeding supplement, ENSURE ENLIVE, (ENSURE ENLIVE) LIQD Take 237 mLs by mouth 2 (two) times daily between meals.    ferrous gluconate (FERGON) 324 MG tablet Take 1 tablet (324 mg total) by mouth daily with breakfast.    fluticasone (FLONASE) 50 MCG/ACT nasal spray Place 2 sprays into both nostrils daily.    Fluticasone-Umeclidin-Vilant (TRELEGY ELLIPTA) 200-62.5-25 MCG/INH AEPB Inhale 200 mcg into the lungs daily.    furosemide (LASIX) 40 MG tablet Take 1 tablet (40 mg total) by mouth daily as needed for edema.    nystatin (MYCOSTATIN) 100000 UNIT/ML suspension TAKE 5 MLS BY MOUTH 4 TIMES A DAY.    oxybutynin (DITROPAN XL) 15 MG 24 hr tablet Take 1 tablet (15 mg total) by mouth at bedtime.    OXYGEN Inhale  2 L into the lungs daily.    pentosan polysulfate (ELMIRON) 100 MG capsule Take 1 capsule (100 mg total) by mouth 2 (two) times daily as needed (urinary pain).    traMADol (ULTRAM) 50 MG tablet Take 1 tablet (50 mg total) by mouth every 8 (eight) hours as needed. for pain    TRELEGY ELLIPTA 100-62.5-25 MCG/INH AEPB INHALE 1 PUFF INTO THE LUNGS DAILY    Urea 40 % LOTN Apply 1 application topically daily as needed (dry skin).     VENTOLIN HFA 108 (90 Base) MCG/ACT inhaler Inhale 2 puffs into  the lungs every 4 (four) hours as needed for wheezing or shortness of breath.    verapamil (CALAN-SR) 120 MG CR tablet TAKE 1 TABLET BY MOUTH AT BEDTIME    No facility-administered encounter medications on file as of 10/24/2021.    Patient Active Problem List   Diagnosis Date Noted   Primary hypertension 06/05/2021   Atrial fibrillation with rapid ventricular response ([place]) 03/20/2021   Microcytic anemia 03/19/2021   Iron deficiency 03/19/2021   A-fib ([place]) 03/19/2021   Memory loss 11/21/2020   Polyp of colon    Acute blood loss anemia 08/09/2020   GI bleeding 08/04/2020   Depression, major, single episode, in partial remission ([place]) 06/22/2020   Atrial fibrillation with RVR ([place]) 06/13/2020   Chronic respiratory failure with hypoxia ([place]) 06/13/2020   Right hand pain 06/13/2020   Fall at home, initial encounter 06/13/2020   COPD (chronic obstructive pulmonary disease) ([place]) 06/13/2020   Bronchiectasis without complication ([place]) 74/94/4967   Lymphedema 05/17/2019   Chronic venous insufficiency 05/17/2019   Swelling of limb 02/10/2018   Varicose veins of leg with swelling, bilateral 02/10/2018   Squamous cell carcinoma 09/22/2017   Hypoxia 09/27/2015   Abnormal CXR 09/27/2015   Asthma with allergic rhinitis 08/31/2015   Back ache 08/31/2015   Chronic interstitial cystitis 08/31/2015   Grief reaction 08/31/2015   Hyperlipidemia 06/26/2015   Migraine 06/26/2015   Asthma 06/26/2015   IBS (irritable bowel syndrome) 06/26/2015   Osteoporosis 06/26/2015   Fatigue 06/26/2015   Lumbar radiculopathy 04/27/2013   Urinary urgency 01/08/2012   Frequency of urination 01/08/2012    Patient Care Plan: COPD (Adult)     Problem Identified: Symptom Exacerbation (COPD)      Long-Range Goal: Symptom Exacerbation Prevented or Minimized   Start Date: 06/29/2021  Expected End Date: 10/27/2021  Priority: High  Note:      Patient Care Plan: Fall Risk (Adult)     Problem Identified:  Fall Risk      Long-Range Goal: Absence of Fall and Fall-Related Injury   Start Date: 06/29/2021  Expected End Date: 10/27/2021  Priority: High  Note:   Current Barriers:  Risk for Falls in patient with COPD.  Clinical Goal(s):  Over the next 120 days, patient will not experience falls or require emergent care due to fall related injuries.  Interventions:  Collaboration with Brita Romp Dionne Bucy, MD regarding development and update of comprehensive plan of care as evidenced by provider attestation and co-signature. Inter-disciplinary care team collaboration (see longitudinal plan of care) Reviewed fall and safety prevention measures.  Reports compliance with safety recommendations. She continues to experience joint discomfort and numbness d/t previous wrist injury. Denies recent falls. Continues to perform ADL's independently. Discussed availability of providers in the [place] area. She prefers to reestablish contact with providers that were previously following her in [place] and [place]. Requested assistance with determining if the providers rotate to  clinics closer to [place]. She is willing to be evaluated in [place] if her previous providers are available. Will follow up to determine if providers are available.   Self-Care/Patient Goals:  Utilize appropriate assistive device with all ambulation Change positions slowly Wear secure non skid footwear with all ambulation Utilize home lighting for dim lit areas and ensure pathways are clear Contact the clinic with questions and new concerns if needed   Follow Up Plan:  Will follow up within the next month       PLAN A member of the care management team will follow up within the next week.  [name] Care Management [facility] [phone]

## 2021-10-26 ENCOUNTER — Ambulatory Visit: Payer: Medicare HMO

## 2021-10-26 DIAGNOSIS — Z9181 History of falling: Secondary | ICD-10-CM

## 2021-10-26 DIAGNOSIS — J449 Chronic obstructive pulmonary disease, unspecified: Secondary | ICD-10-CM

## 2021-10-26 NOTE — Patient Instructions (Signed)
Thank you for allowing the Chronic Care Management team to participate in your care.  

## 2021-10-26 NOTE — Chronic Care Management (AMB) (Signed)
Chronic Care Management   CCM RN Visit Note  10/26/2021 Name: Divine Imber MRN: [id number] DOB: 04-Nov-1936  Subjective: Jensen Earlee Herald is a 85 y.o. year old female who is a primary care patient of Bacigalupo, Dionne Bucy, MD. The care management team was consulted for assistance with disease management and care coordination needs.    Engaged with patient by telephone for follow up visit in response to provider referral for case management and care coordination services.   Consent to Services:  The patient was given information about Chronic Care Management services, agreed to services, and gave verbal consent prior to initiation of services.  Please see initial visit note for detailed documentation.   Assessment: Review of patient past medical history, allergies, medications, health status, including review of consultants reports, laboratory and other test data, was performed as part of comprehensive evaluation and provision of chronic care management services.   SDOH (Social Determinants of Health) assessments and interventions performed: No   CCM Care Plan  Allergies  Allergen Reactions   Nsaids Other (See Comments)    History of gastric ulcer   Amoxicillin Nausea Only and Rash   Ibandronic Acid Other (See Comments)    Muscle weakness - advised not to take  **BONIVA** - drug name   Actonel  [Risedronate] Other (See Comments)    Gastric ulcers   Antihistamines, Chlorpheniramine-Type    Aspirin     Gastric ulcer   Buspar [Buspirone] Other (See Comments)    Pt does not remember reaction    Butalbital-Aspirin-Caffeine Other (See Comments)    Other reaction(s): Unknown   Clarithromycin Other (See Comments)    Bloating  *BIAXIN*   Codeine    Gatifloxacin Other (See Comments)   Hydrocodone-Guaifenesin Nausea Only    CODICLEAR DH STYRUP    Moxifloxacin    Oxycodone Other (See Comments)    Dizziness   Penicillins Other (See Comments)   Risedronate Sodium      Gastric ulcer   Tylenol With Codeine #3  [Acetaminophen-Codeine]     Other reaction(s): Unknown   Raloxifene Other (See Comments)    Bloating Bloating  *EVISTA*    Outpatient Encounter Medications as of 10/26/2021  Medication Sig Note   albuterol (PROVENTIL) (2.5 MG/3ML) 0.083% nebulizer solution Take 3 mLs (2.5 mg total) by nebulization every 6 (six) hours as needed for wheezing or shortness of breath. 06/29/2021: Reports not currently using.   Calcium Carb-Cholecalciferol 600-400 MG-UNIT TABS Take by mouth.    dicyclomine (BENTYL) 10 MG capsule Take 1 capsule (10 mg total) by mouth 3 (three) times daily as needed for spasms.    doxepin (SINEQUAN) 10 MG capsule TAKE 3 CAPSULES BY MOUTH AT BEDTIME    feeding supplement, ENSURE ENLIVE, (ENSURE ENLIVE) LIQD Take 237 mLs by mouth 2 (two) times daily between meals.    ferrous gluconate (FERGON) 324 MG tablet Take 1 tablet (324 mg total) by mouth daily with breakfast.    fluticasone (FLONASE) 50 MCG/ACT nasal spray Place 2 sprays into both nostrils daily.    Fluticasone-Umeclidin-Vilant (TRELEGY ELLIPTA) 200-62.5-25 MCG/INH AEPB Inhale 200 mcg into the lungs daily.    furosemide (LASIX) 40 MG tablet Take 1 tablet (40 mg total) by mouth daily as needed for edema.    nystatin (MYCOSTATIN) 100000 UNIT/ML suspension TAKE 5 MLS BY MOUTH 4 TIMES A DAY.    oxybutynin (DITROPAN XL) 15 MG 24 hr tablet Take 1 tablet (15 mg total) by mouth at bedtime.    OXYGEN Inhale  2 L into the lungs daily.    pentosan polysulfate (ELMIRON) 100 MG capsule Take 1 capsule (100 mg total) by mouth 2 (two) times daily as needed (urinary pain).    traMADol (ULTRAM) 50 MG tablet Take 1 tablet (50 mg total) by mouth every 8 (eight) hours as needed. for pain    TRELEGY ELLIPTA 100-62.5-25 MCG/INH AEPB INHALE 1 PUFF INTO THE LUNGS DAILY    Urea 40 % LOTN Apply 1 application topically daily as needed (dry skin).     VENTOLIN HFA 108 (90 Base) MCG/ACT inhaler Inhale 2 puffs into  the lungs every 4 (four) hours as needed for wheezing or shortness of breath.    verapamil (CALAN-SR) 120 MG CR tablet TAKE 1 TABLET BY MOUTH AT BEDTIME    No facility-administered encounter medications on file as of 10/26/2021.    Patient Active Problem List   Diagnosis Date Noted   Primary hypertension 06/05/2021   Atrial fibrillation with rapid ventricular response ([place]) 03/20/2021   Microcytic anemia 03/19/2021   Iron deficiency 03/19/2021   A-fib ([place]) 03/19/2021   Memory loss 11/21/2020   Polyp of colon    Acute blood loss anemia 08/09/2020   GI bleeding 08/04/2020   Depression, major, single episode, in partial remission ([place]) 06/22/2020   Atrial fibrillation with RVR ([place]) 06/13/2020   Chronic respiratory failure with hypoxia ([place]) 06/13/2020   Right hand pain 06/13/2020   Fall at home, initial encounter 06/13/2020   COPD (chronic obstructive pulmonary disease) ([place]) 06/13/2020   Bronchiectasis without complication ([place]) 45/80/9983   Lymphedema 05/17/2019   Chronic venous insufficiency 05/17/2019   Swelling of limb 02/10/2018   Varicose veins of leg with swelling, bilateral 02/10/2018   Squamous cell carcinoma 09/22/2017   Hypoxia 09/27/2015   Abnormal CXR 09/27/2015   Asthma with allergic rhinitis 08/31/2015   Back ache 08/31/2015   Chronic interstitial cystitis 08/31/2015   Grief reaction 08/31/2015   Hyperlipidemia 06/26/2015   Migraine 06/26/2015   Asthma 06/26/2015   IBS (irritable bowel syndrome) 06/26/2015   Osteoporosis 06/26/2015   Fatigue 06/26/2015   Lumbar radiculopathy 04/27/2013   Urinary urgency 01/08/2012   Frequency of urination 01/08/2012    Conditions to be addressed/monitored:COPD and Fall Risk  Patient Care Plan: Fall Risk (Adult)     Problem Identified: Fall Risk      Long-Range Goal: Absence of Fall and Fall-Related Injury   Start Date: 06/29/2021  Expected End Date: 10/27/2021  Priority: High  Note:   Current Barriers:  Risk  for Falls in patient with COPD.  Clinical Goal(s):  Over the next 120 days, patient will not experience falls or require emergent care due to fall related injuries.  Interventions:  Collaboration with Brita Romp Dionne Bucy, MD regarding development and update of comprehensive plan of care as evidenced by provider attestation and co-signature. Inter-disciplinary care team collaboration (see longitudinal plan of care) Reviewed fall and safety prevention measures.  Reports compliance with safety recommendations. She continues to experience joint discomfort and numbness d/t previous wrist injury. Denies recent falls. Continues to perform ADL's independently. Discussed availability of providers in the [place] area. She prefers to reestablish contact with providers that were previously following her in [place] and [place]. Requested assistance with determining if the providers rotate to clinics closer to [place]. She is willing to be evaluated in [place] if her previous providers are available. Will follow up to determine if providers are available. Update 10/26/21: Confirmed options for Mrs. Devera to receive injections  for chronic back pain at [facility] with Dr. Pearline Cables. Mrs. Corker is agreeable to receiving the injections in [place]. Will follow up with the Ortho team next week to determine is a new referral for reevaluation will be required. Will coordinate appointment after speaking with the Ortho team.   Self-Care/Patient Goals:  Utilize appropriate assistive device with all ambulation Change positions slowly Wear secure non skid footwear with all ambulation Utilize home lighting for dim lit areas and ensure pathways are clear Contact the clinic with questions and new concerns if needed        PLAN A member of the care management team will follow up next week.   [name] [phone]

## 2021-10-30 ENCOUNTER — Ambulatory Visit (INDEPENDENT_AMBULATORY_CARE_PROVIDER_SITE_OTHER): Payer: Medicare HMO

## 2021-10-30 DIAGNOSIS — Z0000 Encounter for general adult medical examination without abnormal findings: Secondary | ICD-10-CM

## 2021-10-30 NOTE — Progress Notes (Addendum)
Virtual Visit via Telephone Note  I connected with  Jackie Turner on 10/30/21 at  3:00 PM EST by telephone and verified that I am speaking with the correct person using two identifiers.  Location: Patient: home  Provider: [facility] Persons participating in the virtual visit: [place]   I discussed the limitations, risks, security and privacy concerns of performing an evaluation and management service by telephone and the availability of in person appointments. The patient expressed understanding and agreed to proceed.  Interactive audio and video telecommunications were attempted between this nurse and patient, however failed, due to patient having technical difficulties OR patient did not have access to video capability.  We continued and completed visit with audio only.  Some vital signs may be absent or patient reported.   Dionisio David, LPN   Subjective:   Jackie Turner is a 85 y.o. female who presents for Medicare Annual (Subsequent) preventive examination.  Review of Systems     Cardiac Risk Factors include: advanced age (>19men, >64 women);sedentary lifestyle     Objective:    There were no vitals filed for this visit. There is no height or weight on file to calculate BMI.  Advanced Directives 10/30/2021 07/20/2021 06/28/2021 03/20/2021 03/19/2021 03/19/2021 10/26/2020  Does Patient Have a Medical Advance Directive? No No No No No No Yes  Type of Advance Directive - - - - - - Press photographer;Living will  Copy of [place] in Chart? - - - - - - No - copy requested  Would patient like information on creating a medical advance directive? No - Patient declined No - Patient declined - No - Patient declined - No - Patient declined -    Current Medications (verified) Outpatient Encounter Medications as of 10/30/2021  Medication Sig   albuterol (PROVENTIL) (2.5 MG/3ML) 0.083% nebulizer solution Take 3 mLs (2.5 mg total)  by nebulization every 6 (six) hours as needed for wheezing or shortness of breath.   Calcium Carb-Cholecalciferol 600-400 MG-UNIT TABS Take by mouth.   clotrimazole (LOTRIMIN) 1 % cream clotrimazole 1 % topical cream   dicyclomine (BENTYL) 10 MG capsule Take 1 capsule (10 mg total) by mouth 3 (three) times daily as needed for spasms.   doxepin (SINEQUAN) 10 MG capsule TAKE 3 CAPSULES BY MOUTH AT BEDTIME   feeding supplement, ENSURE ENLIVE, (ENSURE ENLIVE) LIQD Take 237 mLs by mouth 2 (two) times daily between meals.   fluticasone (FLONASE) 50 MCG/ACT nasal spray Place 2 sprays into both nostrils daily.   Fluticasone-Umeclidin-Vilant (TRELEGY ELLIPTA) 200-62.5-25 MCG/INH AEPB Inhale 200 mcg into the lungs daily.   furosemide (LASIX) 40 MG tablet Take 1 tablet (40 mg total) by mouth daily as needed for edema.   metoprolol succinate (TOPROL-XL) 25 MG 24 hr tablet metoprolol succinate ER 25 mg tablet,extended release 24 hr   metoprolol tartrate (LOPRESSOR) 25 MG tablet metoprolol tartrate 25 mg tablet   nystatin (MYCOSTATIN) 100000 UNIT/ML suspension TAKE 5 MLS BY MOUTH 4 TIMES A DAY.   oxybutynin (DITROPAN XL) 15 MG 24 hr tablet Take 1 tablet (15 mg total) by mouth at bedtime.   OXYGEN Inhale 2 L into the lungs daily.   pentosan polysulfate (ELMIRON) 100 MG capsule Take 1 capsule (100 mg total) by mouth 2 (two) times daily as needed (urinary pain).   traMADol (ULTRAM) 50 MG tablet Take 1 tablet (50 mg total) by mouth every 8 (eight) hours as needed. for pain   TRELEGY ELLIPTA  100-62.5-25 MCG/INH AEPB INHALE 1 PUFF INTO THE LUNGS DAILY   Urea 40 % LOTN Apply 1 application topically daily as needed (dry skin).    VENTOLIN HFA 108 (90 Base) MCG/ACT inhaler Inhale 2 puffs into the lungs every 4 (four) hours as needed for wheezing or shortness of breath.   verapamil (CALAN-SR) 120 MG CR tablet TAKE 1 TABLET BY MOUTH AT BEDTIME   [DISCONTINUED] ferrous gluconate (FERGON) 324 MG tablet Take 1 tablet (324  mg total) by mouth daily with breakfast.   No facility-administered encounter medications on file as of 10/30/2021.    Allergies (verified) Nsaids; Amoxicillin; Ibandronic acid; Actonel  [risedronate]; Antihistamines, chlorpheniramine-type; Aspirin; Buspar [buspirone]; Butalbital-aspirin-caffeine; Clarithromycin; Codeine; Gatifloxacin; Hydrocodone-guaifenesin; Moxifloxacin; Oxycodone; Penicillins; Risedronate sodium; Tylenol with codeine #3  [acetaminophen-codeine]; and Raloxifene   History: Past Medical History:  Diagnosis Date   Allergy    Asthma    Hyperlipidemia    Osteoporosis    Past Surgical History:  Procedure Laterality Date   ABDOMINAL HYSTERECTOMY  2003   BREAST BIOPSY  1976, 1978   benign   carpal tunnel repair Bilateral    R 1990; L 1997   COLONOSCOPY WITH PROPOFOL N/A 08/14/2020   Procedure: COLONOSCOPY WITH PROPOFOL;  Surgeon: Lucilla Lame, MD;  Location: [facility] ENDOSCOPY;  Service: Endoscopy;  Laterality: N/A;   ESOPHAGOGASTRODUODENOSCOPY N/A 08/12/2020   Procedure: ESOPHAGOGASTRODUODENOSCOPY (EGD);  Surgeon: Lin Landsman, MD;  Location: [facility] ENDOSCOPY;  Service: Gastroenterology;  Laterality: N/A;   EYE SURGERY Bilateral    cataract extraction   HAND TENDON SURGERY Right    Trigger finger   IR RADIOLOGIST EVAL & MGMT  06/13/2017   IR VERTEBROPLASTY CERV/THOR BX INC UNI/BIL INC/INJECT/IMAGING  06/17/2017   TONSILLECTOMY  1961   Family History  Problem Relation Age of Onset   Heart disease Mother    CAD Mother    Congestive Heart Failure Mother    Osteoarthritis Mother    Cancer Sister        breast   Breast cancer Sister    Social History   Socioeconomic History   Marital status: Widowed    Spouse name: [name]   Number of children: 0   Years of education: H/S   Highest education level: 12th grade  Occupational History   Occupation: Retired  Tobacco Use   Smoking status: Never   Smokeless tobacco: Never  Vaping Use   Vaping Use: Never used   Substance and Sexual Activity   Alcohol use: No   Drug use: No   Sexual activity: Never  Other Topics Concern   Not on file  Social History Narrative   Not on file   Social Determinants of Health   Financial Resource Strain: Low Risk    Difficulty of Paying Living Expenses: Not hard at all  Food Insecurity: No Food Insecurity   Worried About Charity fundraiser in the Last Year: Never true   [place] in the Last Year: Never true  Transportation Needs: No Transportation Needs   Lack of Transportation (Medical): No   Lack of Transportation (Non-Medical): No  Physical Activity: Inactive   Days of Exercise per Week: 0 days   Minutes of Exercise per Session: 0 min  Stress: No Stress Concern Present   Feeling of Stress : Not at all  Social Connections: Unknown   Frequency of Communication with Friends and Family: Not on file   Frequency of Social Gatherings with Friends and Family: Once a week  Attends Religious Services: Never   Active Member of Clubs or Organizations: No   Attends Archivist Meetings: Never   Marital Status: Widowed    Tobacco Counseling Counseling given: Not Answered   Clinical Intake:  Pre-visit preparation completed: Yes  Pain : No/denies pain     Nutritional Risks: None Diabetes: No  How often do you need to have someone help you when you read instructions, pamphlets, or other written materials from your doctor or pharmacy?: 1 - Never  Interpreter Needed?: No  Information entered by :: Kirke Shaggy, LPN   Activities of Daily Living In your present state of health, do you have any difficulty performing the following activities: 10/30/2021 06/05/2021  Hearing? N N  Vision? N N  Difficulty concentrating or making decisions? N N  Walking or climbing stairs? N N  Dressing or bathing? N N  Doing errands, shopping? N Y  Conservation officer, nature and eating ? N -  Using the Toilet? N -  In the past six months, have you accidently  leaked urine? N -  Do you have problems with loss of bowel control? N -  Managing your Medications? N -  Managing your Finances? N -  Housekeeping or managing your Housekeeping? N -  Some recent data might be hidden    Patient Care Team: Bacigalupo, Dionne Bucy, MD as PCP - General (Family Medicine) Domingo Pulse, MD as Consulting Physician (Urology) Solum, Betsey Holiday, MD as Physician Assistant (Endocrinology) Theresa Duty, MD (Radiology) Jules Husbands, MD as Consulting Physician (General Surgery) Flora Lipps, MD as Consulting Physician (Pulmonary Disease) Isaias Cowman, MD as Consulting Physician (Cardiology) Dingeldein, Remo Lipps, MD (Ophthalmology) Neldon Labella, RN as Case Manager Cammie Sickle, MD as Consulting Physician (Internal Medicine)  Indicate any recent Medical Services you may have received from other than [facility] providers in the past year (date may be approximate).     Assessment:   This is a routine wellness examination for Jackie Turner.  Hearing/Vision screen Hearing Screening - Comments:: Hearing is good Vision Screening - Comments:: Sees [facility]  Dietary issues and exercise activities discussed: Current Exercise Habits: The patient does not participate in regular exercise at present, Exercise limited by: respiratory conditions(s)   Goals Addressed   None    Depression Screen PHQ 2/9 Scores 10/30/2021 06/05/2021 11/28/2020 10/30/2020 10/26/2020 09/28/2019 05/01/2018  PHQ - 2 Score 0 0 0 1 0 0 0  PHQ- 9 Score - 9 - 5 - - 0    Fall Risk Fall Risk  10/30/2021 06/05/2021 10/26/2020 09/18/2020 09/04/2020  Falls in the past year? 0 0 0 0 0  Number falls in past yr: 0 0 0 0 -  Injury with Fall? 0 0 0 0 -  Risk for fall due to : No Fall Risks No Fall Risks - - -  Follow up Falls prevention discussed Falls evaluation completed - - -    FALL RISK PREVENTION PERTAINING TO THE HOME:  Any stairs in or around the home? Yes  If so, are there any  without handrails? No  Home free of loose throw rugs in walkways, pet beds, electrical cords, etc? Yes  Adequate lighting in your home to reduce risk of falls? Yes   ASSISTIVE DEVICES UTILIZED TO PREVENT FALLS:  Life alert? No  Use of a cane, walker or w/c? No  Grab bars in the bathroom? Yes  Shower chair or bench in shower? Yes  Elevated toilet seat or a handicapped  toilet? No   TIMED UP AND GO:  Was the test performed? No .   Cognitive Function:Normal cognitive status assessed by direct observation by this Nurse Health Advisor. No abnormalities found.       6CIT Screen 09/28/2019 04/24/2017  What Year? 0 points 0 points  What month? 0 points 0 points  What time? 0 points 0 points  Count back from 20 0 points 0 points  Months in reverse 0 points 0 points  Repeat phrase 0 points 2 points  Total Score 0 2    Immunizations Immunization History  Administered Date(s) Administered   Fluad Quad(high Dose 65+) 11/10/2019, 11/15/2020, 10/05/2021   Influenza Split 09/25/2009   Influenza, High Dose Seasonal PF 09/27/2015, 10/21/2016, 09/22/2017, 09/25/2018   PFIZER(Purple Top)SARS-COV-2 Vaccination 01/06/2020, 01/27/2020, 10/30/2020   Pneumococcal Conjugate-13 02/09/2015   Pneumococcal Polysaccharide-23 10/07/2003   Td 05/14/2007    TDAP status: Up to date  Flu Vaccine status: Up to date  Pneumococcal vaccine status: Declined,  Education has been provided regarding the importance of this vaccine but patient still declined. Advised may receive this vaccine at local pharmacy or Health Dept. Aware to provide a copy of the vaccination record if obtained from local pharmacy or Health Dept. Verbalized acceptance and understanding.   Covid-19 vaccine status: Completed vaccines  Qualifies for Shingles Vaccine? No   Zostavax completed No   Shingrix Completed?: No.    Education has been provided regarding the importance of this vaccine. Patient has been advised to call insurance company  to determine out of pocket expense if they have not yet received this vaccine. Advised may also receive vaccine at local pharmacy or Health Dept. Verbalized acceptance and understanding.  Screening Tests Health Maintenance  Topic Date Due   Zoster Vaccines- Shingrix (1 of 2) Never done   TETANUS/TDAP  05/13/2017   MAMMOGRAM  08/04/2020   COVID-19 Vaccine (4 - Booster for [employer] series) 12/25/2020   DEXA SCAN  06/21/2022   Pneumonia Vaccine 62+ Years old  Completed   INFLUENZA VACCINE  Completed   HPV VACCINES  Aged Out    Health Maintenance  Health Maintenance Due  Topic Date Due   Zoster Vaccines- Shingrix (1 of 2) Never done   TETANUS/TDAP  05/13/2017   MAMMOGRAM  08/04/2020   COVID-19 Vaccine (4 - Booster for [employer] series) 12/25/2020    Colorectal cancer screening: Type of screening: Colonoscopy. Completed 08/14/20. Repeat every 10 years  Mammogram status: Completed 8/. Repeat every year/  Bone Density status: Completed 06/21/20. Results reflect: Bone density results: OSTEOPENIA. Repeat every 2 years.  Lung Cancer Screening: (Low Dose CT Chest recommended if Age 26-80 years, 30 pack-year currently smoking OR have quit w/in 15years.) does not qualify.     Additional Screening:  Vision Screening: Recommended annual ophthalmology exams for early detection of glaucoma and other disorders of the eye. Is the patient up to date with their annual eye exam?  Yes  Who is the provider or what is the name of the office in which the patient attends annual eye exams? [place] Screening: Recommended annual dental exams for proper oral hygiene  Community Resource Referral / Chronic Care Management: CRR required this visit?  No   CCM required this visit?  No      Plan:     I have personally reviewed and noted the following in the patient's chart:   Medical and social history Use of alcohol, tobacco or illicit drugs  Current medications  and supplements  including opioid prescriptions.  Functional ability and status Nutritional status Physical activity Advanced directives List of other physicians Hospitalizations, surgeries, and ER visits in previous 12 months Vitals Screenings to include cognitive, depression, and falls Referrals and appointments  In addition, I have reviewed and discussed with patient certain preventive protocols, quality metrics, and best practice recommendations. A written personalized care plan for preventive services as well as general preventive health recommendations were provided to patient.     Dionisio David, LPN   [id number]   Nurse Notes: none  I have reviewed the health advisor's note, was available for consultation, and agree with documentation and plan  Bacigalupo, Dionne Bucy, MD, MPH [facility] 11/01/2021 8:53 AM

## 2021-10-30 NOTE — Patient Instructions (Signed)
[name] , Thank you for taking time to come for your Medicare Wellness Visit. I appreciate your ongoing commitment to your health goals. Please review the following plan we discussed and let me know if I can assist you in the future.   Screening recommendations/referrals: Colonoscopy: 08/14/20 Mammogram: 08/05/19 Bone Density: 06/21/20 Recommended yearly ophthalmology/optometry visit for glaucoma screening and checkup Recommended yearly dental visit for hygiene and checkup  Vaccinations: Influenza vaccine: 10/05/21 Pneumococcal vaccine: 01/17/15 declined further immunizations Tdap vaccine: 05/14/07 Shingles vaccine: only one vaccine has been given   Covid-19:01/06/20, 01/27/20, 10/30/20  Advanced directives: declined paperwork  Conditions/risks identified:   Next appointment: Follow up in one year for your annual wellness visit    Preventive Care 62 Years and Older, Female Preventive care refers to lifestyle choices and visits with your health care provider that can promote health and wellness. What does preventive care include? A yearly physical exam. This is also called an annual well check. Dental exams once or twice a year. Routine eye exams. Ask your health care provider how often you should have your eyes checked. Personal lifestyle choices, including: Daily care of your teeth and gums. Regular physical activity. Eating a healthy diet. Avoiding tobacco and drug use. Limiting alcohol use. Practicing safe sex. Taking low-dose aspirin every day. Taking vitamin and mineral supplements as recommended by your health care provider. What happens during an annual well check? The services and screenings done by your health care provider during your annual well check will depend on your age, overall health, lifestyle risk factors, and family history of disease. Counseling  Your health care provider may ask you questions about your: Alcohol use. Tobacco use. Drug use. Emotional  well-being. Home and relationship well-being. Sexual activity. Eating habits. History of falls. Memory and ability to understand (cognition). Work and work Statistician. Reproductive health. Screening  You may have the following tests or measurements: Height, weight, and BMI. Blood pressure. Lipid and cholesterol levels. These may be checked every 5 years, or more frequently if you are over 88 years old. Skin check. Lung cancer screening. You may have this screening every year starting at age 70 if you have a 30-pack-year history of smoking and currently smoke or have quit within the past 15 years. Fecal occult blood test (FOBT) of the stool. You may have this test every year starting at age 74. Flexible sigmoidoscopy or colonoscopy. You may have a sigmoidoscopy every 5 years or a colonoscopy every 10 years starting at age 60. Hepatitis C blood test. Hepatitis B blood test. Sexually transmitted disease (STD) testing. Diabetes screening. This is done by checking your blood sugar (glucose) after you have not eaten for a while (fasting). You may have this done every 1-3 years. Bone density scan. This is done to screen for osteoporosis. You may have this done starting at age 96. Mammogram. This may be done every 1-2 years. Talk to your health care provider about how often you should have regular mammograms. Talk with your health care provider about your test results, treatment options, and if necessary, the need for more tests. Vaccines  Your health care provider may recommend certain vaccines, such as: Influenza vaccine. This is recommended every year. Tetanus, diphtheria, and acellular pertussis (Tdap, Td) vaccine. You may need a Td booster every 10 years. Zoster vaccine. You may need this after age 66. Pneumococcal 13-valent conjugate (PCV13) vaccine. One dose is recommended after age 1. Pneumococcal polysaccharide (PPSV23) vaccine. One dose is recommended after age 34. Talk  to your  health care provider about which screenings and vaccines you need and how often you need them. This information is not intended to replace advice given to you by your health care provider. Make sure you discuss any questions you have with your health care provider. Document Released: 12/29/2015 Document Revised: 08/21/2016 Document Reviewed: 10/03/2015 Elsevier Interactive Patient Education  2017 [place] Prevention in the Home Falls can cause injuries. They can happen to people of all ages. There are many things you can do to make your home safe and to help prevent falls. What can I do on the outside of my home? Regularly fix the edges of walkways and driveways and fix any cracks. Remove anything that might make you trip as you walk through a door, such as a raised step or threshold. Trim any bushes or trees on the path to your home. Use bright outdoor lighting. Clear any walking paths of anything that might make someone trip, such as rocks or tools. Regularly check to see if handrails are loose or broken. Make sure that both sides of any steps have handrails. Any raised decks and porches should have guardrails on the edges. Have any leaves, snow, or ice cleared regularly. Use sand or salt on walking paths during winter. Clean up any spills in your garage right away. This includes oil or grease spills. What can I do in the bathroom? Use night lights. Install grab bars by the toilet and in the tub and shower. Do not use towel bars as grab bars. Use non-skid mats or decals in the tub or shower. If you need to sit down in the shower, use a plastic, non-slip stool. Keep the floor dry. Clean up any water that spills on the floor as soon as it happens. Remove soap buildup in the tub or shower regularly. Attach bath mats securely with double-sided non-slip rug tape. Do not have throw rugs and other things on the floor that can make you trip. What can I do in the bedroom? Use night  lights. Make sure that you have a light by your bed that is easy to reach. Do not use any sheets or blankets that are too big for your bed. They should not hang down onto the floor. Have a firm chair that has side arms. You can use this for support while you get dressed. Do not have throw rugs and other things on the floor that can make you trip. What can I do in the kitchen? Clean up any spills right away. Avoid walking on wet floors. Keep items that you use a lot in easy-to-reach places. If you need to reach something above you, use a strong step stool that has a grab bar. Keep electrical cords out of the way. Do not use floor polish or wax that makes floors slippery. If you must use wax, use non-skid floor wax. Do not have throw rugs and other things on the floor that can make you trip. What can I do with my stairs? Do not leave any items on the stairs. Make sure that there are handrails on both sides of the stairs and use them. Fix handrails that are broken or loose. Make sure that handrails are as long as the stairways. Check any carpeting to make sure that it is firmly attached to the stairs. Fix any carpet that is loose or worn. Avoid having throw rugs at the top or bottom of the stairs. If you do have throw rugs,  attach them to the floor with carpet tape. Make sure that you have a light switch at the top of the stairs and the bottom of the stairs. If you do not have them, ask someone to add them for you. What else can I do to help prevent falls? Wear shoes that: Do not have high heels. Have rubber bottoms. Are comfortable and fit you well. Are closed at the toe. Do not wear sandals. If you use a stepladder: Make sure that it is fully opened. Do not climb a closed stepladder. Make sure that both sides of the stepladder are locked into place. Ask someone to hold it for you, if possible. Clearly mark and make sure that you can see: Any grab bars or handrails. First and last  steps. Where the edge of each step is. Use tools that help you move around (mobility aids) if they are needed. These include: Canes. Walkers. Scooters. Crutches. Turn on the lights when you go into a dark area. Replace any light bulbs as soon as they burn out. Set up your furniture so you have a clear path. Avoid moving your furniture around. If any of your floors are uneven, fix them. If there are any pets around you, be aware of where they are. Review your medicines with your doctor. Some medicines can make you feel dizzy. This can increase your chance of falling. Ask your doctor what other things that you can do to help prevent falls. This information is not intended to replace advice given to you by your health care provider. Make sure you discuss any questions you have with your health care provider. Document Released: 09/28/2009 Document Revised: 05/09/2016 Document Reviewed: 01/06/2015 Elsevier Interactive Patient Education  2017 [employer].

## 2021-11-14 ENCOUNTER — Ambulatory Visit: Payer: Self-pay

## 2021-11-14 ENCOUNTER — Other Ambulatory Visit: Payer: Self-pay | Admitting: *Deleted

## 2021-11-14 DIAGNOSIS — J449 Chronic obstructive pulmonary disease, unspecified: Secondary | ICD-10-CM

## 2021-11-14 DIAGNOSIS — D509 Iron deficiency anemia, unspecified: Secondary | ICD-10-CM

## 2021-11-14 DIAGNOSIS — Z9181 History of falling: Secondary | ICD-10-CM

## 2021-11-14 NOTE — Telephone Encounter (Signed)
Pt called, states she is on Trelegy 100 and had received a pamphlet on Trelegy 200 and she was asking which dosage she needs to get refilled. She had called and spoke with Babs Sciara, RN earlier today for a telephone visit so her question was already answered.     Message from Rayann Heman sent at 11/14/2021  2:35 PM EST  Summary: Clinical advice   Pt called and stated that she has some questions regarding inhaler medication. Please advise

## 2021-11-14 NOTE — Chronic Care Management (AMB) (Signed)
Chronic Care Management   CCM RN Visit Note  11/14/2021 Name: Jackie Turner MRN: [id number] DOB: [birth year]  Subjective: Jackie Turner is a 85 y.o. year old female who is a primary care patient of Bacigalupo, Dionne Bucy, MD. The care management team was consulted for assistance with disease management and care coordination needs.    Engaged with patient by telephone for follow up visit in response to provider referral for case management and care coordination services.   Consent to Services:  The patient was given information about Chronic Care Management services, agreed to services, and gave verbal consent prior to initiation of services.  Please see initial visit note for detailed documentation.    Assessment: Review of patient past medical history, allergies, medications, health status, including review of consultants reports, laboratory and other test data, was performed as part of comprehensive evaluation and provision of chronic care management services.   SDOH (Social Determinants of Health) assessments and interventions performed: No  CCM Care Plan  Allergies  Allergen Reactions   Nsaids Other (See Comments)    History of gastric ulcer   Amoxicillin Nausea Only and Rash   Ibandronic Acid Other (See Comments)    Muscle weakness - advised not to take  **BONIVA** - drug name   Actonel  [Risedronate] Other (See Comments)    Gastric ulcers   Antihistamines, Chlorpheniramine-Type    Aspirin     Gastric ulcer   Buspar [Buspirone] Other (See Comments)    Pt does not remember reaction    Butalbital-Aspirin-Caffeine Other (See Comments)    Other reaction(s): Unknown   Clarithromycin Other (See Comments)    Bloating  *BIAXIN*   Codeine    Gatifloxacin Other (See Comments)   Hydrocodone-Guaifenesin Nausea Only    CODICLEAR DH STYRUP    Moxifloxacin    Oxycodone Other (See Comments)    Dizziness   Penicillins Other (See Comments)   Risedronate Sodium      Gastric ulcer   Tylenol With Codeine #3  [Acetaminophen-Codeine]     Other reaction(s): Unknown   Raloxifene Other (See Comments)    Bloating Bloating  *EVISTA*    Outpatient Encounter Medications as of 11/14/2021  Medication Sig Note   TRELEGY ELLIPTA 100-62.5-25 MCG/INH AEPB INHALE 1 PUFF INTO THE LUNGS DAILY    albuterol (PROVENTIL) (2.5 MG/3ML) 0.083% nebulizer solution Take 3 mLs (2.5 mg total) by nebulization every 6 (six) hours as needed for wheezing or shortness of breath. 06/29/2021: Reports not currently using.   Calcium Carb-Cholecalciferol 600-400 MG-UNIT TABS Take by mouth.    clotrimazole (LOTRIMIN) 1 % cream clotrimazole 1 % topical cream (Patient not taking: Reported on 11/14/2021)    dicyclomine (BENTYL) 10 MG capsule Take 1 capsule (10 mg total) by mouth 3 (three) times daily as needed for spasms.    doxepin (SINEQUAN) 10 MG capsule TAKE 3 CAPSULES BY MOUTH AT BEDTIME    feeding supplement, ENSURE ENLIVE, (ENSURE ENLIVE) LIQD Take 237 mLs by mouth 2 (two) times daily between meals.    fluticasone (FLONASE) 50 MCG/ACT nasal spray Place 2 sprays into both nostrils daily.    Fluticasone-Umeclidin-Vilant (TRELEGY ELLIPTA) 200-62.5-25 MCG/INH AEPB Inhale 200 mcg into the lungs daily. (Patient not taking: Reported on 11/14/2021)    furosemide (LASIX) 40 MG tablet Take 1 tablet (40 mg total) by mouth daily as needed for edema.    metoprolol succinate (TOPROL-XL) 25 MG 24 hr tablet metoprolol succinate ER 25 mg tablet,extended release 24 hr (Patient not taking:  Reported on 11/14/2021)    metoprolol tartrate (LOPRESSOR) 25 MG tablet metoprolol tartrate 25 mg tablet (Patient not taking: Reported on 11/14/2021)    nystatin (MYCOSTATIN) 100000 UNIT/ML suspension TAKE 5 MLS BY MOUTH 4 TIMES A DAY.    oxybutynin (DITROPAN XL) 15 MG 24 hr tablet Take 1 tablet (15 mg total) by mouth at bedtime.    OXYGEN Inhale 2 L into the lungs daily.    pentosan polysulfate (ELMIRON) 100 MG capsule  Take 1 capsule (100 mg total) by mouth 2 (two) times daily as needed (urinary pain).    traMADol (ULTRAM) 50 MG tablet Take 1 tablet (50 mg total) by mouth every 8 (eight) hours as needed. for pain    Urea 40 % LOTN Apply 1 application topically daily as needed (dry skin).     VENTOLIN HFA 108 (90 Base) MCG/ACT inhaler Inhale 2 puffs into the lungs every 4 (four) hours as needed for wheezing or shortness of breath.    verapamil (CALAN-SR) 120 MG CR tablet TAKE 1 TABLET BY MOUTH AT BEDTIME    No facility-administered encounter medications on file as of 11/14/2021.    Patient Active Problem List   Diagnosis Date Noted   Primary hypertension 06/05/2021   Atrial fibrillation with rapid ventricular response ([place]) 03/20/2021   Microcytic anemia 03/19/2021   Iron deficiency 03/19/2021   A-fib ([place]) 03/19/2021   Memory loss 11/21/2020   Polyp of colon    Acute blood loss anemia 08/09/2020   GI bleeding 08/04/2020   Depression, major, single episode, in partial remission ([place]) 06/22/2020   Atrial fibrillation with RVR ([place]) 06/13/2020   Chronic respiratory failure with hypoxia ([place]) 06/13/2020   Right hand pain 06/13/2020   Fall at home, initial encounter 06/13/2020   COPD (chronic obstructive pulmonary disease) ([place]) 06/13/2020   Bronchiectasis without complication ([place]) 52/84/1324   Lymphedema 05/17/2019   Chronic venous insufficiency 05/17/2019   Swelling of limb 02/10/2018   Varicose veins of leg with swelling, bilateral 02/10/2018   Squamous cell carcinoma 09/22/2017   Hypoxia 09/27/2015   Abnormal CXR 09/27/2015   Asthma with allergic rhinitis 08/31/2015   Back ache 08/31/2015   Chronic interstitial cystitis 08/31/2015   Grief reaction 08/31/2015   Hyperlipidemia 06/26/2015   Migraine 06/26/2015   Asthma 06/26/2015   IBS (irritable bowel syndrome) 06/26/2015   Osteoporosis 06/26/2015   Fatigue 06/26/2015   Lumbar radiculopathy 04/27/2013   Urinary urgency 01/08/2012    Frequency of urination 01/08/2012    Patient Care Plan: RN Care Management Plan of Care     Problem Identified: COPD and Joint Pain      Long-Range Goal: Disease Progression Minimized or Managed   Start Date: 11/14/2021  Expected End Date: 02/12/2022  Priority: High  Note:   Current Barriers:  Chronic Disease Management support and education needs related to COPD and Fall Risk r/t Joint Pain.  RNCM Clinical Goal(s):  Patient will demonstrate ongoing adherence to prescribed treatment plan for COPD and Fall Risk r/t Joint Pain through collaboration with the provider and care management team.  Interventions: 1:1 collaboration with primary care provider regarding development and update of comprehensive plan of care as evidenced by provider attestation and co-signature Inter-disciplinary care team collaboration (see longitudinal plan of care) Evaluation of current treatment plan related to  self management and patient's adherence to plan as established by provider   COPD Interventions:  (Status:  Goal on track:  Yes.) Long Term Goal Reviewed medications. Reports compliance with prescribed  medications. Reports having two orders for Trelegy Ellipta and requested clarification regarding which dose to take. Per chart review the Trelegy Ellipta 265mcg was a one time order. No refills on file. She currently has an order/with refills for Trelegy Ellipta 100 mcg. Reports the 100 mcg dose is working very well and she prefers to continue using that dose.  Advised to continue taking the 100 mcg dose as prescribed. She will contact the pharmacy today for a refill.  Advised to keep the care management team updated with concerns regarding medication management or prescription costs.  Provided verbal COPD education on self care/management/and exacerbation prevention. Advised to avoid overexertion and take needed precautions  to prevent exacerbation. Discussed current action plan and reinforced importance of  daily self assessment. Advised to make an appointment with provider if experiencing moderate symptoms for 48 hours without improvement.  Reviewed worsening symptoms that require immediate medical attention.  Provided information regarding infection prevention and increased risk r/t COPD.  Advised to utilize prevention strategies to reduce risk of respiratory infection.   Falls Interventions/Joint Pain:  (Status:  Goal on track:  Yes.) Long Term Goal Reviewed medications and discussed potential side effects. Reviewed information regarding safety and fall prevention. Currently using a cane or assistive device when ambulating. Denies recent falls. Advised to avoid prolonged activity to prevent accident falls d/t exertion. Encouraged to avoid attempting to lift weighted objects and request assistance when needed.  Discussed ability to perform ADL's and tasks in the home. Currently lives alone but has very good support. Reports being able to perform all ADL's and small tasks in the home independently. Friends are available to assist with transportation and routine errands. Declines current need for additional in-home assistance.  Discussed plan for pain management. Report receiving injections for spinal stenosis in the several years ago that were very effective. Prefers to reestablish care with Dr. Pryor Curia to resume injections. Confirmed that Dr. Pryor Curia is still available to provide injections. She will require an updated clinic evaluation and referral from her PCP. Coordinated PCP visit for 11/19/21.    Patient Goals/Self-Care Activities: Take all medications as prescribed Attend all scheduled provider appointments Call pharmacy for medication refills 3-7 days in advance of running out of medications Perform all self care activities independently  Call provider office for new concerns or questions     Follow Up Plan:   Will follow up next month       PLAN Jackie Turner will follow up  for a PCP visit as scheduled on 11/19/21. A member of the care management team will follow up next month.   [name] Care Management [facility] [phone]

## 2021-11-14 NOTE — Patient Instructions (Addendum)
Thank you for allowing the Chronic Care Management team to participate in your care.  Please don't hesitate to contact me if I can be of assistance to you prior to our next scheduled telephone appointment.   As a reminder:  -Continue taking your Trelegy Ellipta 100 mcg Inhaler as prescribed -You are scheduled for a clinic visit with your PCP on 11/19/21 to discuss back pain and the referral to continue spinal injections. -Please feel free to contact me if you need assistance coordinating the appointment with Dr. Grace Blight.    [name] Care Management [facility] [phone]

## 2021-11-16 ENCOUNTER — Inpatient Hospital Stay: Payer: Medicare HMO | Attending: Internal Medicine

## 2021-11-16 ENCOUNTER — Inpatient Hospital Stay (HOSPITAL_BASED_OUTPATIENT_CLINIC_OR_DEPARTMENT_OTHER): Payer: Medicare HMO | Admitting: Nurse Practitioner

## 2021-11-16 ENCOUNTER — Other Ambulatory Visit: Payer: Self-pay

## 2021-11-16 ENCOUNTER — Inpatient Hospital Stay: Payer: Medicare HMO

## 2021-11-16 VITALS — BP 136/90 | HR 62 | Temp 97.9°F | Resp 16 | Wt 118.3 lb

## 2021-11-16 DIAGNOSIS — D509 Iron deficiency anemia, unspecified: Secondary | ICD-10-CM

## 2021-11-16 DIAGNOSIS — I4891 Unspecified atrial fibrillation: Secondary | ICD-10-CM | POA: Insufficient documentation

## 2021-11-16 DIAGNOSIS — Z7901 Long term (current) use of anticoagulants: Secondary | ICD-10-CM | POA: Diagnosis not present

## 2021-11-16 DIAGNOSIS — E611 Iron deficiency: Secondary | ICD-10-CM

## 2021-11-16 DIAGNOSIS — Z79899 Other long term (current) drug therapy: Secondary | ICD-10-CM | POA: Diagnosis not present

## 2021-11-16 LAB — BASIC METABOLIC PANEL
Anion gap: 7 (ref 5–15)
BUN: 20 mg/dL (ref 8–23)
CO2: 35 mmol/L — ABNORMAL HIGH (ref 22–32)
Calcium: 9.5 mg/dL (ref 8.9–10.3)
Chloride: 99 mmol/L (ref 98–111)
Creatinine, Ser: 0.72 mg/dL (ref 0.44–1.00)
GFR, Estimated: >60 mL/min (ref >60)
Glucose, Bld: 145 mg/dL — ABNORMAL HIGH (ref 70–99)
Potassium: 5.2 mmol/L — ABNORMAL HIGH (ref 3.5–5.1)
Sodium: 141 mmol/L (ref 135–145)

## 2021-11-16 LAB — CBC WITH DIFFERENTIAL/PLATELET
Abs Immature Granulocytes: 0.04 10*3/uL (ref 0.00–0.07)
Basophils Absolute: 0.1 10*3/uL (ref 0.0–0.1)
Basophils Relative: 1 %
Eosinophils Absolute: 0.3 10*3/uL (ref 0.0–0.5)
Eosinophils Relative: 3 %
HCT: 39.7 % (ref 36.0–46.0)
Hemoglobin: 12.2 g/dL (ref 12.0–15.0)
Immature Granulocytes: 0 %
Lymphocytes Relative: 10 %
Lymphs Abs: 1 10*3/uL (ref 0.7–4.0)
MCH: 28.6 pg (ref 26.0–34.0)
MCHC: 30.7 g/dL (ref 30.0–36.0)
MCV: 93 fL (ref 80.0–100.0)
Monocytes Absolute: 0.7 10*3/uL (ref 0.1–1.0)
Monocytes Relative: 8 %
Neutro Abs: 7.4 10*3/uL (ref 1.7–7.7)
Neutrophils Relative %: 78 %
Platelets: 342 10*3/uL (ref 150–400)
RBC: 4.27 MIL/uL (ref 3.87–5.11)
RDW: 15.1 % (ref 11.5–15.5)
WBC: 9.6 10*3/uL (ref 4.0–10.5)
nRBC: 0 % (ref 0.0–0.2)

## 2021-11-16 NOTE — Progress Notes (Signed)
Pt c/o worsening tingling in left hand which she contributes to prior restraint use in hospital.

## 2021-11-16 NOTE — Progress Notes (Signed)
[place] NOTE  Patient Care Team: Brita Romp Dionne Bucy, MD as PCP - General (Family Medicine) Domingo Pulse, MD as Consulting Physician (Urology) Solum, Betsey Holiday, MD as Physician Assistant (Endocrinology) Theresa Duty, MD (Radiology) Jules Husbands, MD as Consulting Physician (General Surgery) Flora Lipps, MD as Consulting Physician (Pulmonary Disease) Isaias Cowman, MD as Consulting Physician (Cardiology) Dingeldein, Remo Lipps, MD (Ophthalmology) Neldon Labella, RN as Case Manager Cammie Sickle, MD as Consulting Physician (Internal Medicine)  CHIEF COMPLAINTS/PURPOSE OF CONSULTATION: ANEMIA   HEMATOLOGY HISTORY:  #Severe iron deficient anemia Jackie Turner 2022- 7.3] EGD-; colonoscopy- cecal polyp/adenoma [not resected]  #COPD on 2 L of oxygen; history of A. Fib [Dr. Paraschos]-not on Eliquis.  HISTORY OF PRESENTING ILLNESS:  Jackie Turner 85 y.o.  female with multiple medical problems including COPD on 2 L of oxygen, A. Fib, not on anticoagulation, returns to clinic for labs and follow up, and consideration of IV iron. No nausea, vomiting, abdominal pain. Has ongoing fatigue which is stable and unchanged.   Review of Systems  Constitutional:  Positive for malaise/fatigue. Negative for chills, diaphoresis and fever.  HENT:  Negative for nosebleeds and sore throat.   Eyes:  Negative for double vision.  Respiratory:  Positive for shortness of breath. Negative for cough, hemoptysis, sputum production and wheezing.   Cardiovascular:  Negative for chest pain, palpitations, orthopnea and leg swelling.  Gastrointestinal:  Negative for abdominal pain, blood in stool, constipation, diarrhea, heartburn, nausea and vomiting.  Genitourinary:  Negative for dysuria, frequency and urgency.  Musculoskeletal:  Positive for back pain and joint pain.  Skin: Negative.  Negative for itching and rash.  Neurological:  Negative for tingling, focal weakness,  weakness and headaches.  Endo/Heme/Allergies:  Does not bruise/bleed easily.  Psychiatric/Behavioral:  Negative for depression. The patient is not nervous/anxious and does not have insomnia.    MEDICAL HISTORY:  Past Medical History:  Diagnosis Date   Allergy    Asthma    Hyperlipidemia    Osteoporosis     SURGICAL HISTORY: Past Surgical History:  Procedure Laterality Date   ABDOMINAL HYSTERECTOMY  2003   BREAST BIOPSY  1976, 1978   benign   carpal tunnel repair Bilateral    R 1990; L 1997   COLONOSCOPY WITH PROPOFOL N/A 08/14/2020   Procedure: COLONOSCOPY WITH PROPOFOL;  Surgeon: Lucilla Lame, MD;  Location: [facility] ENDOSCOPY;  Service: Endoscopy;  Laterality: N/A;   ESOPHAGOGASTRODUODENOSCOPY N/A 08/12/2020   Procedure: ESOPHAGOGASTRODUODENOSCOPY (EGD);  Surgeon: Lin Landsman, MD;  Location: [facility] ENDOSCOPY;  Service: Gastroenterology;  Laterality: N/A;   EYE SURGERY Bilateral    cataract extraction   HAND TENDON SURGERY Right    Trigger finger   IR RADIOLOGIST EVAL & MGMT  06/13/2017   IR VERTEBROPLASTY CERV/THOR BX INC UNI/BIL INC/INJECT/IMAGING  06/17/2017   TONSILLECTOMY  1961    SOCIAL HISTORY: Social History   Socioeconomic History   Marital status: Widowed    Spouse name: [name]   Number of children: 0   Years of education: H/S   Highest education level: 12th grade  Occupational History   Occupation: Retired  Tobacco Use   Smoking status: Never   Smokeless tobacco: Never  Vaping Use   Vaping Use: Never used  Substance and Sexual Activity   Alcohol use: No   Drug use: No   Sexual activity: Never  Other Topics Concern   Not on file  Social History Narrative   Not on file   Social  Determinants of Health   Financial Resource Strain: Low Risk    Difficulty of Paying Living Expenses: Not hard at all  Food Insecurity: No Food Insecurity   Worried About [place] in the Last Year: Never true   Ran Out of Food in the Last Year: Never true   Transportation Needs: No Transportation Needs   Lack of Transportation (Medical): No   Lack of Transportation (Non-Medical): No  Physical Activity: Inactive   Days of Exercise per Week: 0 days   Minutes of Exercise per Session: 0 min  Stress: No Stress Concern Present   Feeling of Stress : Not at all  Social Connections: Unknown   Frequency of Communication with Friends and Family: Not on file   Frequency of Social Gatherings with Friends and Family: Once a week   Attends Religious Services: Never   Marine scientist or Organizations: No   Attends Archivist Meetings: Never   Marital Status: Widowed  Human resources officer Violence: Not At Risk   Fear of Current or Ex-Partner: No   Emotionally Abused: No   Physically Abused: No   Sexually Abused: No    FAMILY HISTORY: Family History  Problem Relation Age of Onset   Heart disease Mother    CAD Mother    Congestive Heart Failure Mother    Osteoarthritis Mother    Cancer Sister        breast   Breast cancer Sister     ALLERGIES:  is allergic to nsaids; amoxicillin; ibandronic acid; actonel  [risedronate]; antihistamines, chlorpheniramine-type; aspirin; buspar [buspirone]; butalbital-aspirin-caffeine; clarithromycin; codeine; gatifloxacin; hydrocodone-guaifenesin; moxifloxacin; oxycodone; penicillins; risedronate sodium; tylenol with codeine #3  [acetaminophen-codeine]; azithromycin; and raloxifene.  MEDICATIONS:  Current Outpatient Medications  Medication Sig Dispense Refill   Calcium Carb-Cholecalciferol 600-400 MG-UNIT TABS Take by mouth.     dicyclomine (BENTYL) 10 MG capsule Take 1 capsule (10 mg total) by mouth 3 (three) times daily as needed for spasms. 90 capsule 1   doxepin (SINEQUAN) 10 MG capsule TAKE 3 CAPSULES BY MOUTH AT BEDTIME 270 capsule 0   feeding supplement, ENSURE ENLIVE, (ENSURE ENLIVE) LIQD Take 237 mLs by mouth 2 (two) times daily between meals. 237 mL 12   fluticasone (FLONASE) 50 MCG/ACT  nasal spray Place 2 sprays into both nostrils daily. 16 g 5   furosemide (LASIX) 40 MG tablet Take 1 tablet (40 mg total) by mouth daily as needed for edema. 90 tablet 1   nystatin (MYCOSTATIN) 100000 UNIT/ML suspension TAKE 5 MLS BY MOUTH 4 TIMES A DAY. 60 mL 5   oxybutynin (DITROPAN XL) 15 MG 24 hr tablet Take 1 tablet (15 mg total) by mouth at bedtime. 90 tablet 1   OXYGEN Inhale 2 L into the lungs daily.     pentosan polysulfate (ELMIRON) 100 MG capsule Take 1 capsule (100 mg total) by mouth 2 (two) times daily as needed (urinary pain). 180 capsule 1   traMADol (ULTRAM) 50 MG tablet Take 1 tablet (50 mg total) by mouth every 8 (eight) hours as needed. for pain 90 tablet 5   TRELEGY ELLIPTA 100-62.5-25 MCG/INH AEPB INHALE 1 PUFF INTO THE LUNGS DAILY 60 each 11   Urea 40 % LOTN Apply 1 application topically daily as needed (dry skin).   0   VENTOLIN HFA 108 (90 Base) MCG/ACT inhaler Inhale 2 puffs into the lungs every 4 (four) hours as needed for wheezing or shortness of breath. 18 g 3   verapamil (CALAN-SR) 120  MG CR tablet TAKE 1 TABLET BY MOUTH AT BEDTIME 90 tablet 1   albuterol (PROVENTIL) (2.5 MG/3ML) 0.083% nebulizer solution Take 3 mLs (2.5 mg total) by nebulization every 6 (six) hours as needed for wheezing or shortness of breath. (Patient not taking: Reported on 11/16/2021) 75 mL 12   clotrimazole (LOTRIMIN) 1 % cream clotrimazole 1 % topical cream (Patient not taking: Reported on 11/14/2021)     Fluticasone-Umeclidin-Vilant (TRELEGY ELLIPTA) 200-62.5-25 MCG/INH AEPB Inhale 200 mcg into the lungs daily. (Patient not taking: Reported on 11/14/2021) 28 each 0   metoprolol succinate (TOPROL-XL) 25 MG 24 hr tablet metoprolol succinate ER 25 mg tablet,extended release 24 hr (Patient not taking: Reported on 11/14/2021)     metoprolol tartrate (LOPRESSOR) 25 MG tablet metoprolol tartrate 25 mg tablet (Patient not taking: Reported on 11/14/2021)     No current facility-administered medications for  this visit.    PHYSICAL EXAMINATION: Vitals:   11/16/21 1320  BP: 136/90  Pulse: 62  Resp: 16  Temp: 97.9 F (36.6 C)  SpO2: 97%   Filed Weights   11/16/21 1320  Weight: 118 lb 4.8 oz (53.7 kg)    Physical Exam Constitutional:      Interventions: Nasal cannula in place.     Comments: Frail-appearing  HENT:     Head: Normocephalic and atraumatic.     Mouth/Throat:     Pharynx: No oropharyngeal exudate.  Eyes:     Conjunctiva/sclera: Conjunctivae normal.  Cardiovascular:     Rate and Rhythm: Tachycardia present. Rhythm irregular.  Pulmonary:     Effort: No respiratory distress.     Breath sounds: No wheezing.     Comments: Decreased air entry bilaterally.  Abdominal:     General: There is no distension.     Palpations: Abdomen is soft. There is no mass.     Tenderness: There is no abdominal tenderness. There is no guarding or rebound.  Musculoskeletal:        General: No tenderness or deformity.     Cervical back: Normal range of motion and neck supple.  Skin:    General: Skin is warm.     Coloration: Skin is not pale.  Neurological:     Mental Status: She is alert and oriented to person, place, and time.  Psychiatric:        Mood and Affect: Mood and affect normal.        Behavior: Behavior normal.    LABORATORY DATA:  I have reviewed the data as listed Lab Results  Component Value Date   WBC 9.6 11/16/2021   HGB 12.2 11/16/2021   HCT 39.7 11/16/2021   MCV 93.0 11/16/2021   PLT 342 11/16/2021   Recent Labs    03/19/21 1545 03/20/21 0413 06/28/21 1733 07/20/21 1455 09/07/21 1543 11/16/21 1257  NA 140 137 141 140 139 141  K 5.3* 4.8 4.5 4.8 4.6 5.2*  CL 100 98 98 97* 95* 99  CO2 32 29 33* 36* 35* 35*  GLUCOSE 141* 195* 100* 112* 143* 145*  BUN 31* 31* 18 24* 21 20  CREATININE 0.66 0.80 0.61 0.79 0.56 0.72  CALCIUM 9.4 9.2 9.3 9.7 9.5 9.5  GFRNONAA >60 >60 >60 >60 >60 >60  PROT 7.0 6.8 6.9  --   --   --   ALBUMIN 3.9 3.8 4.1  --   --   --    AST 32 41 24  --   --   --   ALT 16  18 21  --   --   --   ALKPHOS 60 53 89  --   --   --   BILITOT 0.5 0.8 0.5  --   --   --   Iron/TIBC/Ferritin/ %Sat    Component Value Date/Time   IRON 170 07/20/2021 1455   IRON 38 06/05/2021 1532   TIBC 545 (H) 07/20/2021 1455   TIBC 519 (H) 06/05/2021 1532   FERRITIN 12 07/20/2021 1455   FERRITIN 10 (L) 06/05/2021 1532   IRONPCTSAT 31 07/20/2021 1455   IRONPCTSAT 7 (LL) 06/05/2021 1532    No results found.  Assessment & Plan Microcytic anemia Iron deficient anemia- March 2022 hemoglobin 7.3.  Status post iron infusions.  Hemoglobin today is improved and stable at 12.2.  Normocytic.  Hold IV iron.  EGD revealed no obvious bleeding.  Colonoscopy showed cecal polyp questionable adenoma.  Source of bleeding August 2021.  Not resected given location. A. fib-not on Eliquis.  Managed by Dr. Stephani Police. On rate controlling medications.   RTC in 4 months for labs (cbc, bmp, ferritin, iron studies), see Dr. Rogue Bussing, possible venofer  No problem-specific Assessment & Plan notes found for this encounter.  All questions were answered. The patient knows to call the clinic with any problems, questions or concerns.    Verlon Au, NP 11/16/2021

## 2021-11-19 ENCOUNTER — Ambulatory Visit (INDEPENDENT_AMBULATORY_CARE_PROVIDER_SITE_OTHER): Payer: Medicare HMO | Admitting: Family Medicine

## 2021-11-19 ENCOUNTER — Encounter: Payer: Self-pay | Admitting: Family Medicine

## 2021-11-19 ENCOUNTER — Other Ambulatory Visit: Payer: Self-pay

## 2021-11-19 VITALS — BP 151/85 | HR 130 | Wt 119.0 lb

## 2021-11-19 DIAGNOSIS — J449 Chronic obstructive pulmonary disease, unspecified: Secondary | ICD-10-CM

## 2021-11-19 DIAGNOSIS — M5416 Radiculopathy, lumbar region: Secondary | ICD-10-CM | POA: Diagnosis not present

## 2021-11-19 DIAGNOSIS — G4700 Insomnia, unspecified: Secondary | ICD-10-CM | POA: Insufficient documentation

## 2021-11-19 MED ORDER — MELATONIN 3 MG PO TABS: 3.0000 mg | ORAL_TABLET | Freq: Every evening | ORAL | 1 refills | Status: AC | PRN

## 2021-11-19 NOTE — Assessment & Plan Note (Signed)
Continue doxepin, but consider tapering dose to a more typical level in the future Trial of melatonin

## 2021-11-19 NOTE — Progress Notes (Signed)
Established patient visit   Patient: Jackie Turner   DOB: [birth year]   85 y.o. Female  MRN: [id number] Visit Date: 11/19/2021  Today's healthcare provider: Lavon Paganini, MD   Chief Complaint  Patient presents with   Back Pain   Subjective    Back Pain This is a chronic problem. The problem is unchanged. The pain is present in the lumbar spine. Pertinent negatives include no abdominal pain, bladder incontinence, bowel incontinence, chest pain, dysuria, fever, headaches, leg pain, numbness, paresis, paresthesias, pelvic pain, perianal numbness, tingling, weakness or weight loss.   Pt is needing a referral for back injections.   Used to see Dr Pearline Cables and last shot was pre-COVID.  Does not sleep well. Has not in a long time. Wakes after 2 hours or so. Sleeps in recliner due to back compression fractures. She is taking doxepin qhs (initially took for IBS and took closer to 100mg  qhs). Never tried anything else.  Medications: Outpatient Medications Prior to Visit  Medication Sig   albuterol (PROVENTIL) (2.5 MG/3ML) 0.083% nebulizer solution Take 3 mLs (2.5 mg total) by nebulization every 6 (six) hours as needed for wheezing or shortness of breath.   Calcium Carb-Cholecalciferol 600-400 MG-UNIT TABS Take by mouth.   dicyclomine (BENTYL) 10 MG capsule Take 1 capsule (10 mg total) by mouth 3 (three) times daily as needed for spasms.   doxepin (SINEQUAN) 10 MG capsule TAKE 3 CAPSULES BY MOUTH AT BEDTIME   feeding supplement, ENSURE ENLIVE, (ENSURE ENLIVE) LIQD Take 237 mLs by mouth 2 (two) times daily between meals.   fluticasone (FLONASE) 50 MCG/ACT nasal spray Place 2 sprays into both nostrils daily.   furosemide (LASIX) 40 MG tablet Take 1 tablet (40 mg total) by mouth daily as needed for edema.   nystatin (MYCOSTATIN) 100000 UNIT/ML suspension TAKE 5 MLS BY MOUTH 4 TIMES A DAY.   oxybutynin (DITROPAN XL) 15 MG 24 hr tablet Take 1 tablet (15 mg total) by mouth at bedtime.    OXYGEN Inhale 2 L into the lungs daily.   pentosan polysulfate (ELMIRON) 100 MG capsule Take 1 capsule (100 mg total) by mouth 2 (two) times daily as needed (urinary pain).   traMADol (ULTRAM) 50 MG tablet Take 1 tablet (50 mg total) by mouth every 8 (eight) hours as needed. for pain   TRELEGY ELLIPTA 100-62.5-25 MCG/INH AEPB INHALE 1 PUFF INTO THE LUNGS DAILY   VENTOLIN HFA 108 (90 Base) MCG/ACT inhaler Inhale 2 puffs into the lungs every 4 (four) hours as needed for wheezing or shortness of breath.   verapamil (CALAN-SR) 120 MG CR tablet TAKE 1 TABLET BY MOUTH AT BEDTIME   [DISCONTINUED] clotrimazole (LOTRIMIN) 1 % cream    [DISCONTINUED] Fluticasone-Umeclidin-Vilant (TRELEGY ELLIPTA) 200-62.5-25 MCG/INH AEPB Inhale 200 mcg into the lungs daily.   [DISCONTINUED] metoprolol succinate (TOPROL-XL) 25 MG 24 hr tablet metoprolol succinate ER 25 mg tablet,extended release 24 hr (Patient not taking: Reported on 11/14/2021)   [DISCONTINUED] metoprolol tartrate (LOPRESSOR) 25 MG tablet metoprolol tartrate 25 mg tablet (Patient not taking: Reported on 11/14/2021)   [DISCONTINUED] Urea 40 % LOTN Apply 1 application topically daily as needed (dry skin).    No facility-administered medications prior to visit.    Review of Systems  Constitutional: Negative.  Negative for fever and weight loss.  Cardiovascular:  Negative for chest pain.  Gastrointestinal: Negative.  Negative for abdominal pain and bowel incontinence.  Genitourinary:  Negative for bladder incontinence, dysuria and pelvic pain.  Musculoskeletal:  Positive for back pain. Negative for arthralgias, gait problem, joint swelling, myalgias, neck pain and neck stiffness.  Neurological:  Negative for dizziness, tingling, weakness, light-headedness, numbness, headaches and paresthesias.  Psychiatric/Behavioral:  Positive for sleep disturbance.       Objective    BP (!) 151/85 (BP Location: Right Arm, Patient Position: Sitting, Cuff Size: Normal)    Pulse (!) 130   Wt 119 lb (54 kg)   SpO2 98% Comment: 2L of O2  BMI 25.31 kg/m    Physical Exam Vitals reviewed.  Constitutional:      General: She is not in acute distress.    Appearance: Normal appearance. She is well-developed. She is not diaphoretic.  HENT:     Head: Normocephalic and atraumatic.  Eyes:     General: No scleral icterus.    Conjunctiva/sclera: Conjunctivae normal.  Neck:     Thyroid: No thyromegaly.  Cardiovascular:     Rate and Rhythm: Normal rate and regular rhythm.     Pulses: Normal pulses.     Heart sounds: Normal heart sounds. No murmur heard. Pulmonary:     Effort: Pulmonary effort is normal. No respiratory distress.     Breath sounds: Wheezing present. No rhonchi or rales.  Musculoskeletal:     Cervical back: Neck supple.     Right lower leg: No edema.     Left lower leg: No edema.  Lymphadenopathy:     Cervical: No cervical adenopathy.  Skin:    General: Skin is warm and dry.     Findings: No rash.  Neurological:     Mental Status: She is alert and oriented to person, place, and time. Mental status is at baseline.  Psychiatric:        Mood and Affect: Mood normal.        Behavior: Behavior normal.      No results found for any visits on 11/19/21.  Assessment & Plan     Problem List Items Addressed This Visit       Respiratory   COPD (chronic obstructive pulmonary disease) ([place])    Mild wheeze today with no exacerbation Continue management per Pulm        Nervous and Auditory   Lumbar radiculopathy - Primary    Ongoing back pain causing her to sleep in recliner  Wants to repeat injections that have helped in the past - will place referral      Relevant Orders   Ambulatory referral to Interventional Radiology     Other   Insomnia    Continue doxepin, but consider tapering dose to a more typical level in the future Trial of melatonin        Return in about 6 months (around 05/20/2022) for CPE.      I, Lavon Paganini, MD, have reviewed all documentation for this visit. The documentation on 11/19/21 for the exam, diagnosis, procedures, and orders are all accurate and complete.   Kiernan Atkerson, Dionne Bucy, MD, MPH [facility]

## 2021-11-19 NOTE — Assessment & Plan Note (Signed)
Mild wheeze today with no exacerbation Continue management per [facility]

## 2021-11-19 NOTE — Assessment & Plan Note (Signed)
Ongoing back pain causing her to sleep in recliner  Wants to repeat injections that have helped in the past - will place referral

## 2021-11-22 ENCOUNTER — Encounter: Payer: Self-pay | Admitting: Internal Medicine

## 2021-11-30 ENCOUNTER — Other Ambulatory Visit: Payer: Self-pay | Admitting: Family Medicine

## 2021-11-30 DIAGNOSIS — I4891 Unspecified atrial fibrillation: Secondary | ICD-10-CM

## 2021-12-01 NOTE — Telephone Encounter (Signed)
Requested Prescriptions  Pending Prescriptions Disp Refills   verapamil (CALAN-SR) 120 MG CR tablet [Pharmacy Med Name: VERAPAMIL HCL ER 120 MG TAB] 90 tablet 0    Sig: TAKE 1 TABLET BY MOUTH AT BEDTIME     Cardiovascular:  Calcium Channel Blockers Failed - 11/30/2021 12:00 PM      Failed - Last BP in normal range    BP Readings from Last 1 Encounters:  11/19/21 (!) 151/85         Passed - Valid encounter within last 6 months    Recent Outpatient Visits          1 week ago Lumbar radiculopathy   [facility] [place], Dionne Bucy, MD   1 month ago Acute maxillary sinusitis, recurrence not specified   [facility] Birdie Sons, MD   4 months ago Dry heaves   [place], PA-C   5 months ago Atrial fibrillation with [facility])   [facility] [place], Dionne Bucy, MD   8 months ago Chronic obstructive pulmonary disease, unspecified COPD type [facility])   [facility] Jerrol Banana., MD

## 2021-12-21 ENCOUNTER — Ambulatory Visit (INDEPENDENT_AMBULATORY_CARE_PROVIDER_SITE_OTHER): Payer: Medicare HMO

## 2021-12-21 DIAGNOSIS — J449 Chronic obstructive pulmonary disease, unspecified: Secondary | ICD-10-CM

## 2021-12-21 DIAGNOSIS — Z9181 History of falling: Secondary | ICD-10-CM

## 2021-12-21 NOTE — Chronic Care Management (AMB) (Signed)
Chronic Care Management   CCM RN Visit Note  12/21/2021 Name: Jackie Turner MRN: [id number] DOB: 1936/01/18  Subjective: Jackie Turner is a 86 y.o. year old female who is a primary care patient of Bacigalupo, Dionne Bucy, MD. The care management team was consulted for assistance with disease management and care coordination needs.    Engaged with patient by telephone for follow up visit in response to provider referral for case management and care coordination services.   Consent to Services:  The patient was given information about Chronic Care Management services, agreed to services, and gave verbal consent prior to initiation of services.  Please see initial visit note for detailed documentation.    Assessment: Review of patient past medical history, allergies, medications, health status, including review of consultants reports, laboratory and other test data, was performed as part of comprehensive evaluation and provision of chronic care management services.   SDOH (Social Determinants of Health) assessments and interventions performed: No  CCM Care Plan  Allergies  Allergen Reactions   Nsaids Other (See Comments)    History of gastric ulcer   Amoxicillin Nausea Only and Rash   Ibandronic Acid Other (See Comments)    Muscle weakness - advised not to take  **BONIVA** - drug name   Actonel  [Risedronate] Other (See Comments)    Gastric ulcers   Antihistamines, Chlorpheniramine-Type    Aspirin     Gastric ulcer   Buspar [Buspirone] Other (See Comments)    Pt does not remember reaction    Butalbital-Aspirin-Caffeine Other (See Comments)    Other reaction(s): Unknown   Clarithromycin Other (See Comments)    Bloating  *BIAXIN*   Codeine    Gatifloxacin Other (See Comments)   Hydrocodone-Guaifenesin Nausea Only    CODICLEAR DH STYRUP    Moxifloxacin    Oxycodone Other (See Comments)    Dizziness   Penicillins Other (See Comments)   Risedronate Sodium      Gastric ulcer   Tylenol With Codeine #3  [Acetaminophen-Codeine]     Other reaction(s): Unknown   Azithromycin Other (See Comments)    Pt states med causes severe abd pain   Raloxifene Other (See Comments)    Bloating Bloating  *EVISTA*    Outpatient Encounter Medications as of 12/21/2021  Medication Sig Note   albuterol (PROVENTIL) (2.5 MG/3ML) 0.083% nebulizer solution Take 3 mLs (2.5 mg total) by nebulization every 6 (six) hours as needed for wheezing or shortness of breath. 06/29/2021: Reports not currently using.   Calcium Carb-Cholecalciferol 600-400 MG-UNIT TABS Take by mouth.    dicyclomine (BENTYL) 10 MG capsule Take 1 capsule (10 mg total) by mouth 3 (three) times daily as needed for spasms.    doxepin (SINEQUAN) 10 MG capsule TAKE 3 CAPSULES BY MOUTH AT BEDTIME    feeding supplement, ENSURE ENLIVE, (ENSURE ENLIVE) LIQD Take 237 mLs by mouth 2 (two) times daily between meals.    fluticasone (FLONASE) 50 MCG/ACT nasal spray Place 2 sprays into both nostrils daily.    furosemide (LASIX) 40 MG tablet Take 1 tablet (40 mg total) by mouth daily as needed for edema.    melatonin 3 MG TABS tablet Take 1 tablet (3 mg total) by mouth at bedtime as needed.    nystatin (MYCOSTATIN) 100000 UNIT/ML suspension TAKE 5 MLS BY MOUTH 4 TIMES A DAY.    oxybutynin (DITROPAN XL) 15 MG 24 hr tablet Take 1 tablet (15 mg total) by mouth at bedtime.    OXYGEN Inhale  2 L into the lungs daily.    pentosan polysulfate (ELMIRON) 100 MG capsule Take 1 capsule (100 mg total) by mouth 2 (two) times daily as needed (urinary pain).    traMADol (ULTRAM) 50 MG tablet Take 1 tablet (50 mg total) by mouth every 8 (eight) hours as needed. for pain    TRELEGY ELLIPTA 100-62.5-25 MCG/INH AEPB INHALE 1 PUFF INTO THE LUNGS DAILY    VENTOLIN HFA 108 (90 Base) MCG/ACT inhaler Inhale 2 puffs into the lungs every 4 (four) hours as needed for wheezing or shortness of breath.    verapamil (CALAN-SR) 120 MG CR tablet TAKE 1  TABLET BY MOUTH AT BEDTIME    No facility-administered encounter medications on file as of 12/21/2021.    Patient Active Problem List   Diagnosis Date Noted   Insomnia 11/19/2021   Primary hypertension 06/05/2021   Atrial fibrillation with rapid ventricular response ([place]) 03/20/2021   Microcytic anemia 03/19/2021   Iron deficiency 03/19/2021   A-fib ([place]) 03/19/2021   Memory loss 11/21/2020   Polyp of colon    Acute blood loss anemia 08/09/2020   GI bleeding 08/04/2020   Depression, major, single episode, in partial remission ([place]) 06/22/2020   Atrial fibrillation with RVR ([place]) 06/13/2020   Chronic respiratory failure with hypoxia ([place]) 06/13/2020   Right hand pain 06/13/2020   Fall at home, initial encounter 06/13/2020   COPD (chronic obstructive pulmonary disease) ([place]) 06/13/2020   Bronchiectasis without complication ([place]) 25/42/7062   Lymphedema 05/17/2019   Chronic venous insufficiency 05/17/2019   Swelling of limb 02/10/2018   Varicose veins of leg with swelling, bilateral 02/10/2018   Squamous cell carcinoma 09/22/2017   Hypoxia 09/27/2015   Abnormal CXR 09/27/2015   Asthma with allergic rhinitis 08/31/2015   Back ache 08/31/2015   Chronic interstitial cystitis 08/31/2015   Grief reaction 08/31/2015   Hyperlipidemia 06/26/2015   Migraine 06/26/2015   Asthma 06/26/2015   IBS (irritable bowel syndrome) 06/26/2015   Osteoporosis 06/26/2015   Fatigue 06/26/2015   Lumbar radiculopathy 04/27/2013   Urinary urgency 01/08/2012   Frequency of urination 01/08/2012    Patient Care Plan: RN Care Management Plan of Care     Problem Identified: COPD and Joint Pain      Long-Range Goal: Disease Progression Minimized or Managed   Start Date: 11/14/2021  Expected End Date: 02/12/2022  Priority: High  Note:   Current Barriers:  Chronic Disease Management support and education needs related to COPD and Fall Risk r/t Joint Pain.  RNCM Clinical Goal(s):  Patient will  demonstrate ongoing adherence to prescribed treatment plan for COPD and Fall Risk r/t Joint Pain through collaboration with the provider and care management team.  Interventions: 1:1 collaboration with primary care provider regarding development and update of comprehensive plan of care as evidenced by provider attestation and co-signature Inter-disciplinary care team collaboration (see longitudinal plan of care) Evaluation of current treatment plan related to  self management and patient's adherence to plan as established by provider   COPD Interventions:   Reviewed plan for COPD management. Reports taking medications as prescribed. Reports experiencing congestion and a productive cough. Also noted decreased activity tolerance when walking to the mailbox today. Feels that the symptoms and fatigue may be related to a recent dental procedure.  Thoroughly discussed symptoms that require immediate medical attention. Declined need for evaluation but agreed to seek medical assistance if the symptoms persist. Reports her niece will be visiting and available to assist on tomorrow. Agreed to  seek immediate medical attention if symptoms worsen over the weekend.    Falls Interventions/Joint Pain:  (Status:  Goal on track:  Yes.) Long Term Goal Reviewed safety and fall prevention measures. Denies recent falls but reports decreased activity tolerance. Reports using caution when ambulating. Her referral for Dr. Pryor Curia was submitted. She is pending a joint injection.  Discussed ability to perform ADL's. Continues to perform ADL's independently. Reports her niece visits every two weeks to assist with needed errands. Anticipates a visit tomorrow. Declines current need for additional assistance in the home. Agreed to update the care management team if this changes.    Patient Goals/Self-Care Activities: Take all medications as prescribed Attend all scheduled provider appointments Call pharmacy for  medication refills 3-7 days in advance of running out of medications Perform all self care activities independently  Call provider office for new concerns or questions     Follow Up Plan:   Will follow up next week      PLAN A member of the care management team will follow up next week.   [name] Care Management [facility] [phone]

## 2021-12-24 ENCOUNTER — Ambulatory Visit: Payer: Medicare HMO

## 2021-12-24 DIAGNOSIS — J449 Chronic obstructive pulmonary disease, unspecified: Secondary | ICD-10-CM

## 2021-12-24 DIAGNOSIS — Z9181 History of falling: Secondary | ICD-10-CM

## 2021-12-24 NOTE — Chronic Care Management (AMB) (Signed)
Chronic Care Management   CCM RN Visit Note  12/24/2021 Name: Jackie Turner MRN: [id number] DOB: May 01, 1936  Subjective: Jackie Turner is a 86 y.o. year old female who is a primary care patient of Bacigalupo, Dionne Bucy, MD. The care management team was consulted for assistance with disease management and care coordination needs.    Engaged with patient by telephone for follow up visit in response to provider referral for case management and care coordination services.   Consent to Services:  The patient was given information about Chronic Care Management services, agreed to services, and gave verbal consent prior to initiation of services.  Please see initial visit note for detailed documentation.    Assessment: Review of patient past medical history, allergies, medications, health status, including review of consultants reports, laboratory and other test data, was performed as part of comprehensive evaluation and provision of chronic care management services.   SDOH (Social Determinants of Health) assessments and interventions performed: No  CCM Care Plan  Allergies  Allergen Reactions   Nsaids Other (See Comments)    History of gastric ulcer   Amoxicillin Nausea Only and Rash   Ibandronic Acid Other (See Comments)    Muscle weakness - advised not to take  **BONIVA** - drug name   Actonel  [Risedronate] Other (See Comments)    Gastric ulcers   Antihistamines, Chlorpheniramine-Type    Aspirin     Gastric ulcer   Buspar [Buspirone] Other (See Comments)    Pt does not remember reaction    Butalbital-Aspirin-Caffeine Other (See Comments)    Other reaction(s): Unknown   Clarithromycin Other (See Comments)    Bloating  *BIAXIN*   Codeine    Gatifloxacin Other (See Comments)   Hydrocodone-Guaifenesin Nausea Only    CODICLEAR DH STYRUP    Moxifloxacin    Oxycodone Other (See Comments)    Dizziness   Penicillins Other (See Comments)   Risedronate Sodium      Gastric ulcer   Tylenol With Codeine #3  [Acetaminophen-Codeine]     Other reaction(s): Unknown   Azithromycin Other (See Comments)    Pt states med causes severe abd pain   Raloxifene Other (See Comments)    Bloating Bloating  *EVISTA*    Outpatient Encounter Medications as of 12/24/2021  Medication Sig Note   albuterol (PROVENTIL) (2.5 MG/3ML) 0.083% nebulizer solution Take 3 mLs (2.5 mg total) by nebulization every 6 (six) hours as needed for wheezing or shortness of breath. 06/29/2021: Reports not currently using.   Calcium Carb-Cholecalciferol 600-400 MG-UNIT TABS Take by mouth.    dicyclomine (BENTYL) 10 MG capsule Take 1 capsule (10 mg total) by mouth 3 (three) times daily as needed for spasms.    doxepin (SINEQUAN) 10 MG capsule TAKE 3 CAPSULES BY MOUTH AT BEDTIME    feeding supplement, ENSURE ENLIVE, (ENSURE ENLIVE) LIQD Take 237 mLs by mouth 2 (two) times daily between meals.    fluticasone (FLONASE) 50 MCG/ACT nasal spray Place 2 sprays into both nostrils daily.    furosemide (LASIX) 40 MG tablet Take 1 tablet (40 mg total) by mouth daily as needed for edema.    melatonin 3 MG TABS tablet Take 1 tablet (3 mg total) by mouth at bedtime as needed.    nystatin (MYCOSTATIN) 100000 UNIT/ML suspension TAKE 5 MLS BY MOUTH 4 TIMES A DAY.    oxybutynin (DITROPAN XL) 15 MG 24 hr tablet Take 1 tablet (15 mg total) by mouth at bedtime.    OXYGEN Inhale  2 L into the lungs daily.    pentosan polysulfate (ELMIRON) 100 MG capsule Take 1 capsule (100 mg total) by mouth 2 (two) times daily as needed (urinary pain).    traMADol (ULTRAM) 50 MG tablet Take 1 tablet (50 mg total) by mouth every 8 (eight) hours as needed. for pain    TRELEGY ELLIPTA 100-62.5-25 MCG/INH AEPB INHALE 1 PUFF INTO THE LUNGS DAILY    VENTOLIN HFA 108 (90 Base) MCG/ACT inhaler Inhale 2 puffs into the lungs every 4 (four) hours as needed for wheezing or shortness of breath.    verapamil (CALAN-SR) 120 MG CR tablet TAKE 1  TABLET BY MOUTH AT BEDTIME    No facility-administered encounter medications on file as of 12/24/2021.    Patient Active Problem List   Diagnosis Date Noted   Insomnia 11/19/2021   Primary hypertension 06/05/2021   Atrial fibrillation with rapid ventricular response ([place]) 03/20/2021   Microcytic anemia 03/19/2021   Iron deficiency 03/19/2021   A-fib ([place]) 03/19/2021   Memory loss 11/21/2020   Polyp of colon    Acute blood loss anemia 08/09/2020   GI bleeding 08/04/2020   Depression, major, single episode, in partial remission ([place]) 06/22/2020   Atrial fibrillation with RVR ([place]) 06/13/2020   Chronic respiratory failure with hypoxia ([place]) 06/13/2020   Right hand pain 06/13/2020   Fall at home, initial encounter 06/13/2020   COPD (chronic obstructive pulmonary disease) ([place]) 06/13/2020   Bronchiectasis without complication ([place]) 70/26/3785   Lymphedema 05/17/2019   Chronic venous insufficiency 05/17/2019   Swelling of limb 02/10/2018   Varicose veins of leg with swelling, bilateral 02/10/2018   Squamous cell carcinoma 09/22/2017   Hypoxia 09/27/2015   Abnormal CXR 09/27/2015   Asthma with allergic rhinitis 08/31/2015   Back ache 08/31/2015   Chronic interstitial cystitis 08/31/2015   Grief reaction 08/31/2015   Hyperlipidemia 06/26/2015   Migraine 06/26/2015   Asthma 06/26/2015   IBS (irritable bowel syndrome) 06/26/2015   Osteoporosis 06/26/2015   Fatigue 06/26/2015   Lumbar radiculopathy 04/27/2013   Urinary urgency 01/08/2012   Frequency of urination 01/08/2012    Patient Care Plan: RN Care Management Plan of Care     Problem Identified: COPD and Joint Pain      Long-Range Goal: Disease Progression Minimized or Managed   Start Date: 11/14/2021  Expected End Date: 02/12/2022  Priority: High  Note:   Current Barriers:  Chronic Disease Management support and education needs related to COPD and Fall Risk r/t Joint Pain.  RNCM Clinical Goal(s):  Patient will  demonstrate ongoing adherence to prescribed treatment plan for COPD and Fall Risk r/t Joint Pain through collaboration with the provider and care management team.  Interventions: 1:1 collaboration with primary care provider regarding development and update of comprehensive plan of care as evidenced by provider attestation and co-signature Inter-disciplinary care team collaboration (see longitudinal plan of care) Evaluation of current treatment plan related to  self management and patient's adherence to plan as established by provider   COPD Interventions:   Reviewed plan for COPD management. Reports taking medications as prescribed. Reports experiencing congestion and a productive cough. Also noted decreased activity tolerance when walking to the mailbox today. Feels that the symptoms and fatigue may be related to a recent dental procedure.  Thoroughly discussed symptoms that require immediate medical attention. Declined need for evaluation but agreed to seek medical assistance if the symptoms persist. Reports her niece will be visiting and available to assist on tomorrow. Agreed to  seek immediate medical attention if symptoms worsen over the weekend. Update on 12/24/21: Reports symptoms r/t cough and congestion have improved today. Reports using oxygen consistently as advised. Continues to experience fatigue but feels it will be relieved with more rest. Declined current need for clinic evaluation. Agreed to seek medical assistance if symptoms return.    Falls Interventions/Joint Pain:  (Status:  Goal on track:  Yes.) Long Term Goal Reviewed safety and fall prevention measures. Denies recent falls but reports decreased activity tolerance. Reports using caution when ambulating. Her referral for Dr. Pryor Curia was submitted. She is pending a joint injection.  Discussed ability to perform ADL's. Continues to perform ADL's independently. Reports her niece visits every two weeks to assist with needed  errands. Anticipates a visit tomorrow. Declines current need for additional assistance in the home. Agreed to update the care management team if this changes. Update on 12/24/21: Reports fatigue today but overall feeling better. Reports rescheduling her injection with Dr. Pryor Curia as a precaution d/t recent cough and congestion. She will receive the injection next month. Agreed to contact the clinic if she requires assistance or evaluation.    Patient Goals/Self-Care Activities: Take all medications as prescribed Attend all scheduled provider appointments Call pharmacy for medication refills 3-7 days in advance of running out of medications Perform all self care activities independently  Call provider office for new concerns or questions     Follow Up Plan:   Will follow up next month.       PLAN A member of the care management team will follow up next month.   [name] Care Management [facility] [phone]

## 2022-01-15 DIAGNOSIS — J449 Chronic obstructive pulmonary disease, unspecified: Secondary | ICD-10-CM

## 2022-01-17 ENCOUNTER — Other Ambulatory Visit: Payer: Self-pay | Admitting: Family Medicine

## 2022-01-17 DIAGNOSIS — F324 Major depressive disorder, single episode, in partial remission: Secondary | ICD-10-CM

## 2022-01-17 NOTE — Telephone Encounter (Signed)
Requested medications are due for refill today.  yes  Requested medications are on the active medications list.  yes  Last refill. 10/19/2021  Future visit scheduled.   yes  Notes to clinic.  Per note form OV of 11/19/2021 pt is to begin reducing this medication. Please advise.    Requested Prescriptions  Pending Prescriptions Disp Refills   doxepin (SINEQUAN) 10 MG capsule [Pharmacy Med Name: DOXEPIN HCL 10 MG CAP] 270 capsule 0    Sig: TAKE 3 CAPSULES BY MOUTH AT BEDTIME     Psychiatry:  Antidepressants - Heterocyclics (TCAs) Passed - 01/17/2022  3:27 PM      Passed - Completed PHQ-2 or PHQ-9 in the last 360 days      Passed - Valid encounter within last 6 months    Recent Outpatient Visits           1 month ago Lumbar radiculopathy   [facility] [place], Dionne Bucy, MD   3 months ago Acute maxillary sinusitis, recurrence not specified   [facility] Birdie Sons, MD   6 months ago Dry heaves   [place], PA-C   7 months ago Atrial fibrillation with RVR [facility])   [facility] [place], Dionne Bucy, MD   9 months ago Chronic obstructive pulmonary disease, unspecified COPD type [facility])   [facility] Jerrol Banana., MD             Psychiatry:  Antidepressants - Heterocyclics (TCAs) - doxepin Passed - 01/17/2022  3:27 PM      Passed - Valid encounter within last 12 months    Recent Outpatient Visits           1 month ago Lumbar radiculopathy   [facility] [place], Dionne Bucy, MD   3 months ago Acute maxillary sinusitis, recurrence not specified   [facility] Birdie Sons, MD   6 months ago Dry heaves   [place], PA-C   7 months ago Atrial fibrillation with RVR [facility])   [facility] [place], Dionne Bucy, MD   9 months ago Chronic obstructive pulmonary disease, unspecified COPD type [facility])    [facility] Jerrol Banana., MD

## 2022-01-22 ENCOUNTER — Other Ambulatory Visit: Payer: Self-pay

## 2022-01-22 ENCOUNTER — Ambulatory Visit: Payer: Medicare HMO | Admitting: Physician Assistant

## 2022-01-22 ENCOUNTER — Telehealth: Payer: Self-pay

## 2022-01-22 ENCOUNTER — Encounter: Payer: Self-pay | Admitting: Physician Assistant

## 2022-01-22 VITALS — BP 132/94 | HR 88 | Temp 98.3°F | Resp 20 | Wt 116.0 lb

## 2022-01-22 DIAGNOSIS — R5383 Other fatigue: Secondary | ICD-10-CM | POA: Diagnosis not present

## 2022-01-22 DIAGNOSIS — R059 Cough, unspecified: Secondary | ICD-10-CM

## 2022-01-22 DIAGNOSIS — R195 Other fecal abnormalities: Secondary | ICD-10-CM

## 2022-01-22 LAB — HEMOCCULT GUIAC POC 1CARD (OFFICE): Fecal Occult Blood, POC: POSITIVE — AB

## 2022-01-22 MED ORDER — CETIRIZINE HCL 5 MG PO TABS
5.0000 mg | ORAL_TABLET | Freq: Every day | ORAL | 5 refills | Status: DC
Start: 2022-01-22 — End: 2022-01-23

## 2022-01-22 NOTE — Progress Notes (Signed)
Established patient visit   Patient: Jackie Turner   DOB: 04-05-1936   86 y.o. Female  MRN: [id number] Visit Date: 01/22/2022  Today's healthcare provider: Dani Gobble Shanitra Phillippi, PA-C  Introduced myself to the patient as a Journalist, newspaper and provided education on APPs in clinical practice.      Chief Complaint  Patient presents with   Cough   I,Sulibeya S Dimas,acting as a scribe for [employer], PA-C.,have documented all relevant documentation on the behalf of [place], PA-C,as directed by  Junie Panning E Deuce Paternoster, PA-C while in the presence of Leopold Smyers E Chyler Creely, PA-C.  Subjective    Cough This is a recurrent problem. The current episode started more than 1 month ago. The problem has been gradually worsening. The cough is Productive of brown sputum. Associated symptoms include shortness of breath. Pertinent negatives include no chest pain, chills, ear pain, fever, nasal congestion, postnasal drip, rhinorrhea, sore throat or wheezing. The symptoms are aggravated by lying down. She has tried nothing for the symptoms.      States she has has a lingering productive cough for the past few months Reports her cough is almost gone now She states she had a tooth pulled in Dec and was given an abx for this procedure  States now she has noted that her cough is mostly at night now  She is predominantly worried about being sick when she gets her back injection next wed.    Patient C/O "tarry stools" and fatigue. Patient reports h/o GI bleed.  States she has been tired recently and has increased SOB She is taking iron supplements which make her stools dark     Medications: Outpatient Medications Prior to Visit  Medication Sig   albuterol (PROVENTIL) (2.5 MG/3ML) 0.083% nebulizer solution Take 3 mLs (2.5 mg total) by nebulization every 6 (six) hours as needed for wheezing or shortness of breath.   Calcium Carb-Cholecalciferol 600-400 MG-UNIT TABS Take by mouth.   dicyclomine (BENTYL) 10 MG capsule Take  1 capsule (10 mg total) by mouth 3 (three) times daily as needed for spasms.   doxepin (SINEQUAN) 10 MG capsule TAKE 3 CAPSULES BY MOUTH AT BEDTIME   feeding supplement, ENSURE ENLIVE, (ENSURE ENLIVE) LIQD Take 237 mLs by mouth 2 (two) times daily between meals.   fluticasone (FLONASE) 50 MCG/ACT nasal spray Place 2 sprays into both nostrils daily.   furosemide (LASIX) 40 MG tablet Take 1 tablet (40 mg total) by mouth daily as needed for edema.   melatonin 3 MG TABS tablet Take 1 tablet (3 mg total) by mouth at bedtime as needed.   nystatin (MYCOSTATIN) 100000 UNIT/ML suspension TAKE 5 MLS BY MOUTH 4 TIMES A DAY.   oxybutynin (DITROPAN XL) 15 MG 24 hr tablet Take 1 tablet (15 mg total) by mouth at bedtime.   OXYGEN Inhale 2 L into the lungs daily.   pentosan polysulfate (ELMIRON) 100 MG capsule Take 1 capsule (100 mg total) by mouth 2 (two) times daily as needed (urinary pain).   traMADol (ULTRAM) 50 MG tablet Take 1 tablet (50 mg total) by mouth every 8 (eight) hours as needed. for pain   TRELEGY ELLIPTA 100-62.5-25 MCG/INH AEPB INHALE 1 PUFF INTO THE LUNGS DAILY   VENTOLIN HFA 108 (90 Base) MCG/ACT inhaler Inhale 2 puffs into the lungs every 4 (four) hours as needed for wheezing or shortness of breath.   verapamil (CALAN-SR) 120 MG CR tablet TAKE 1 TABLET BY MOUTH AT BEDTIME  No facility-administered medications prior to visit.    Review of Systems  Constitutional:  Positive for fatigue. Negative for chills, fever and unexpected weight change.  HENT:  Negative for congestion, ear pain, postnasal drip, rhinorrhea and sore throat.   Respiratory:  Positive for cough and shortness of breath. Negative for wheezing.   Cardiovascular:  Negative for chest pain and palpitations.  Gastrointestinal:  Positive for blood in stool and nausea. Negative for abdominal pain, constipation, diarrhea and vomiting.   Last CBC Lab Results  Component Value Date   WBC 9.6 11/16/2021   HGB 12.2 11/16/2021    HCT 39.7 11/16/2021   MCV 93.0 11/16/2021   MCH 28.6 11/16/2021   RDW 15.1 11/16/2021   PLT 342 51/76/1607   Last metabolic panel Lab Results  Component Value Date   GLUCOSE 145 (H) 11/16/2021   NA 141 11/16/2021   K 5.2 (H) 11/16/2021   CL 99 11/16/2021   CO2 35 (H) 11/16/2021   BUN 20 11/16/2021   CREATININE 0.72 11/16/2021   GFRNONAA >60 11/16/2021   CALCIUM 9.5 11/16/2021   PHOS 2.3 (L) 08/08/2020   PROT 6.9 06/28/2021   ALBUMIN 4.1 06/28/2021   LABGLOB 2.4 03/01/2021   AGRATIO 1.7 03/01/2021   BILITOT 0.5 06/28/2021   ALKPHOS 89 06/28/2021   AST 24 06/28/2021   ALT 21 06/28/2021   ANIONGAP 7 11/16/2021       Objective    BP (!) 132/94 (BP Location: Left Arm, Patient Position: Sitting, Cuff Size: Normal)    Pulse 88    Temp 98.3 F (36.8 C) (Oral)    Resp 20    Wt 116 lb (52.6 kg)    SpO2 96%    BMI 24.67 kg/m  BP Readings from Last 3 Encounters:  01/22/22 (!) 132/94  11/19/21 (!) 151/85  11/16/21 136/90   Wt Readings from Last 3 Encounters:  01/22/22 116 lb (52.6 kg)  11/19/21 119 lb (54 kg)  11/16/21 118 lb 4.8 oz (53.7 kg)      Physical Exam Vitals reviewed.  Constitutional:      Appearance: Normal appearance. She is normal weight.  HENT:     Head: Normocephalic and atraumatic.     Nose: No congestion or rhinorrhea.     Mouth/Throat:     Mouth: Mucous membranes are moist.  Eyes:     Extraocular Movements: Extraocular movements intact.     Conjunctiva/sclera: Conjunctivae normal.     Pupils: Pupils are equal, round, and reactive to light.  Cardiovascular:     Rate and Rhythm: Rhythm regularly irregular.     Pulses: No decreased pulses.  Pulmonary:     Effort: Tachypnea and prolonged expiration present.     Breath sounds: Decreased air movement present. Decreased breath sounds and wheezing present. No rhonchi or rales.  Musculoskeletal:     Right lower leg: No edema.     Left lower leg: No edema.  Neurological:     Mental Status: She is  alert.     Results for orders placed or performed in visit on 01/22/22  POCT occult blood stool  Result Value Ref Range   Fecal Occult Blood, POC Positive (A) Negative    Assessment & Plan      Problem List Items Addressed This Visit       Other   Fatigue Acute, recurrent concern Patient has history of GI bleed and anemia and is concerned for repeat of this issue States she has had several  instances of frank blood in her stools and difficult bowel movements POCT occult blood positive today - unsure if this from rectal irritation from stool passage or GI bleed Will order CBC and iron study for anemia rule out Results to dictate management May need to discuss with patient the importance of increasing fiber content in daily diet and staying well hydrated especially with iron supplementation     Relevant Orders   POCT occult blood stool (Completed)   CBC w/Diff/Platelet   Iron, TIBC and Ferritin Panel   Other Visit Diagnoses     Cough in adult    -  Primary Acute, new problem, resolving  Patient reports ongoing cough since the end of Dec that appears to be resolving  Given patient is on continuous O2 via nasal cannula suspect nasal irritation and drainage as contributing to cough  Recommend she try an antihistamine, nasal saline sprays and humidifier  Sent rx for Cetirizine 5mg  PO QD to assist with suspected rhinitis and associated cough.  Follow up as needed for symptoms that are worsening or failing to improve over the next few weeks.     Relevant Medications   cetirizine (ZYRTEC) 5 MG tablet   Occult blood positive stool     Acute, new concern Patient reports concerns for recent dark colored stool and some intermittent frank blood in her stools POCT hemoccult positive but unsure if this is from GI bleed vs frank blood from irritation  Recommend CBC and iron study to rule out potential anemia, May need referral to GI if CBC indicates    Relevant Orders   CBC  w/Diff/Platelet   Iron, TIBC and Ferritin Panel        No follow-ups on file.   I, Renessa Wellnitz E Acire Tang, PA-C, have reviewed all documentation for this visit. The documentation on 01/22/22 for the exam, diagnosis, procedures, and orders are all accurate and complete.   [place], [place]ie [place] MPH [facility]    No follow-ups on file.        Almon Register, PA-C  [employer] [phone] (phone) [phone] (fax)  [place]

## 2022-01-22 NOTE — Telephone Encounter (Signed)
Copied from [place] [id number]. Topic: General - Other >> Jan 22, 2022  3:53 PM Tessa Lerner A wrote: Reason for CRM: Anderson Malta with the patient's pharmacy has called to verify their recently submitted prescription for cetirizine (ZYRTEC) 5 MG tablet [[id number]]    Anderson Malta was concerned with how low the dosage was and wanted to verify before filling the prescription   Please contact further

## 2022-01-22 NOTE — Patient Instructions (Addendum)
You can use nasal saline sprays to help with nasal irritation and a humidifier at night to reduce dryness  I recommend trying an antihistamine to help further with your symptoms as this can reduce the drainage from your nose and hopefully help with your cough I have sent Cetirizine 5mg  to be taken by mouth once per day for you to try  If you are still having a cough in the next 7 days and persistent shortness of breath please let us know  I would like to check your blood count and iron panel today to make sure you are not anemic due to your concerns for blood in your stool.  We will keep you updated on those results as they are available   It was nice to meet you and I appreciate the opportunity to be involved in your care

## 2022-01-23 LAB — CBC WITH DIFFERENTIAL/PLATELET
Basophils Absolute: 0.1 10*3/uL (ref 0.0–0.2)
Basos: 1 %
EOS (ABSOLUTE): 0.3 10*3/uL (ref 0.0–0.4)
Eos: 4 %
Hematocrit: 38 % (ref 34.0–46.6)
Hemoglobin: 12.3 g/dL (ref 11.1–15.9)
Immature Grans (Abs): 0 10*3/uL (ref 0.0–0.1)
Immature Granulocytes: 1 %
Lymphocytes Absolute: 0.8 10*3/uL (ref 0.7–3.1)
Lymphs: 9 %
MCH: 28 pg (ref 26.6–33.0)
MCHC: 32.4 g/dL (ref 31.5–35.7)
MCV: 86 fL (ref 79–97)
Monocytes Absolute: 0.6 10*3/uL (ref 0.1–0.9)
Monocytes: 7 %
Neutrophils Absolute: 6.9 10*3/uL (ref 1.4–7.0)
Neutrophils: 78 %
Platelets: 360 10*3/uL (ref 150–450)
RBC: 4.4 x10E6/uL (ref 3.77–5.28)
RDW: 13.2 % (ref 11.7–15.4)
WBC: 8.6 10*3/uL (ref 3.4–10.8)

## 2022-01-23 LAB — IRON,TIBC AND FERRITIN PANEL
Ferritin: 55 ng/mL (ref 15–150)
Iron Saturation: 15 % (ref 15–55)
Iron: 52 ug/dL (ref 27–139)
Total Iron Binding Capacity: 345 ug/dL (ref 250–450)
UIBC: 293 ug/dL (ref 118–369)

## 2022-01-23 MED ORDER — CETIRIZINE HCL 10 MG PO TABS: ORAL_TABLET | ORAL | 3 refills | Status: DC

## 2022-01-24 ENCOUNTER — Ambulatory Visit (INDEPENDENT_AMBULATORY_CARE_PROVIDER_SITE_OTHER): Payer: Medicare HMO

## 2022-01-24 DIAGNOSIS — Z9181 History of falling: Secondary | ICD-10-CM

## 2022-01-24 DIAGNOSIS — R5383 Other fatigue: Secondary | ICD-10-CM

## 2022-01-24 DIAGNOSIS — J449 Chronic obstructive pulmonary disease, unspecified: Secondary | ICD-10-CM

## 2022-01-24 NOTE — Chronic Care Management (AMB) (Signed)
Chronic Care Management   CCM RN Visit Note  01/24/2022 Name: Jackie Turner MRN: [id number] DOB: 01/12/1936  Subjective: Jackie Turner is a 86 y.o. year old female who is a primary care patient of Bacigalupo, Dionne Bucy, MD. The care management team was consulted for assistance with disease management and care coordination needs.    Engaged with patient by telephone for follow up visit in response to provider referral for case management and care coordination services.   Consent to Services:  The patient was given information about Chronic Care Management services, agreed to services, and gave verbal consent prior to initiation of services.  Please see initial visit note for detailed documentation.    Assessment: Review of patient past medical history, allergies, medications, health status, including review of consultants reports, laboratory and other test data, was performed as part of comprehensive evaluation and provision of chronic care management services.   SDOH (Social Determinants of Health) assessments and interventions performed: No  CCM Care Plan  Allergies  Allergen Reactions   Nsaids Other (See Comments)    History of gastric ulcer   Amoxicillin Nausea Only and Rash   Ibandronic Acid Other (See Comments)    Muscle weakness - advised not to take  **BONIVA** - drug name   Actonel  [Risedronate] Other (See Comments)    Gastric ulcers   Antihistamines, Chlorpheniramine-Type    Aspirin     Gastric ulcer   Buspar [Buspirone] Other (See Comments)    Pt does not remember reaction    Butalbital-Aspirin-Caffeine Other (See Comments)    Other reaction(s): Unknown   Clarithromycin Other (See Comments)    Bloating  *BIAXIN*   Codeine    Gatifloxacin Other (See Comments)   Hydrocodone-Guaifenesin Nausea Only    CODICLEAR DH STYRUP    Moxifloxacin    Oxycodone Other (See Comments)    Dizziness   Penicillins Other (See Comments)   Risedronate Sodium      Gastric ulcer   Tylenol With Codeine #3  [Acetaminophen-Codeine]     Other reaction(s): Unknown   Azithromycin Other (See Comments)    Pt states med causes severe abd pain   Raloxifene Other (See Comments)    Bloating Bloating  *EVISTA*    Outpatient Encounter Medications as of 01/24/2022  Medication Sig Note   albuterol (PROVENTIL) (2.5 MG/3ML) 0.083% nebulizer solution Take 3 mLs (2.5 mg total) by nebulization every 6 (six) hours as needed for wheezing or shortness of breath. 06/29/2021: Reports not currently using.   Calcium Carb-Cholecalciferol 600-400 MG-UNIT TABS Take by mouth.    cetirizine (ZYRTEC) 10 MG tablet Take 5mg  daily for 1-2 weeks. Then increase to 10mg  daily if tolerating well.    dicyclomine (BENTYL) 10 MG capsule Take 1 capsule (10 mg total) by mouth 3 (three) times daily as needed for spasms.    doxepin (SINEQUAN) 10 MG capsule TAKE 3 CAPSULES BY MOUTH AT BEDTIME    feeding supplement, ENSURE ENLIVE, (ENSURE ENLIVE) LIQD Take 237 mLs by mouth 2 (two) times daily between meals.    fluticasone (FLONASE) 50 MCG/ACT nasal spray Place 2 sprays into both nostrils daily.    furosemide (LASIX) 40 MG tablet Take 1 tablet (40 mg total) by mouth daily as needed for edema.    melatonin 3 MG TABS tablet Take 1 tablet (3 mg total) by mouth at bedtime as needed.    nystatin (MYCOSTATIN) 100000 UNIT/ML suspension TAKE 5 MLS BY MOUTH 4 TIMES A DAY.    oxybutynin (  DITROPAN XL) 15 MG 24 hr tablet Take 1 tablet (15 mg total) by mouth at bedtime.    OXYGEN Inhale 2 L into the lungs daily.    pentosan polysulfate (ELMIRON) 100 MG capsule Take 1 capsule (100 mg total) by mouth 2 (two) times daily as needed (urinary pain).    traMADol (ULTRAM) 50 MG tablet Take 1 tablet (50 mg total) by mouth every 8 (eight) hours as needed. for pain    TRELEGY ELLIPTA 100-62.5-25 MCG/INH AEPB INHALE 1 PUFF INTO THE LUNGS DAILY    VENTOLIN HFA 108 (90 Base) MCG/ACT inhaler Inhale 2 puffs into the lungs every 4  (four) hours as needed for wheezing or shortness of breath.    verapamil (CALAN-SR) 120 MG CR tablet TAKE 1 TABLET BY MOUTH AT BEDTIME    No facility-administered encounter medications on file as of 01/24/2022.    Patient Active Problem List   Diagnosis Date Noted   Insomnia 11/19/2021   Primary hypertension 06/05/2021   Atrial fibrillation with rapid ventricular response ([place]) 03/20/2021   Microcytic anemia 03/19/2021   Iron deficiency 03/19/2021   A-fib ([place]) 03/19/2021   Memory loss 11/21/2020   Polyp of colon    Acute blood loss anemia 08/09/2020   GI bleeding 08/04/2020   Depression, major, single episode, in partial remission ([place]) 06/22/2020   Atrial fibrillation with RVR ([place]) 06/13/2020   Chronic respiratory failure with hypoxia ([place]) 06/13/2020   Right hand pain 06/13/2020   Fall at home, initial encounter 06/13/2020   COPD (chronic obstructive pulmonary disease) ([place]) 06/13/2020   Bronchiectasis without complication ([place]) 73/41/9379   Lymphedema 05/17/2019   Chronic venous insufficiency 05/17/2019   Swelling of limb 02/10/2018   Varicose veins of leg with swelling, bilateral 02/10/2018   Squamous cell carcinoma 09/22/2017   Hypoxia 09/27/2015   Abnormal CXR 09/27/2015   Asthma with allergic rhinitis 08/31/2015   Back ache 08/31/2015   Chronic interstitial cystitis 08/31/2015   Grief reaction 08/31/2015   Hyperlipidemia 06/26/2015   Migraine 06/26/2015   Asthma 06/26/2015   IBS (irritable bowel syndrome) 06/26/2015   Osteoporosis 06/26/2015   Fatigue 06/26/2015   Lumbar radiculopathy 04/27/2013   Urinary urgency 01/08/2012   Frequency of urination 01/08/2012    Patient Care Plan: RN Care Management Plan of Care     Problem Identified: COPD and Joint Pain      Long-Range Goal: Disease Progression Minimized or Managed   Start Date: 11/14/2021  Expected End Date: 02/12/2022  Priority: High  Note:   Current Barriers:  Chronic Disease Management support  and education needs related to COPD and Fall Risk r/t Joint Pain.  RNCM Clinical Goal(s):  Patient will demonstrate ongoing adherence to prescribed treatment plan for COPD and Fall Risk r/t Joint Pain through collaboration with the provider and care management team.  Interventions: 1:1 collaboration with primary care provider regarding development and update of comprehensive plan of care as evidenced by provider attestation and co-signature Inter-disciplinary care team collaboration (see longitudinal plan of care) Evaluation of current treatment plan related to  self management and patient's adherence to plan as established by provider   COPD Interventions:   Reviewed plan for COPD management. Reports feeling fatigued and experiencing a non-productive cough today. She was evaluated in the clinic on 01/22/2022 and medications were ordered. Reports she opted not to take additional medications but will follow up if her cough worsens. She continues to experience shortness of breath with exertion. Denies symptoms at rest. Reports using  inhalers and supplemental oxygen as advised.  Reviewed indications for seeking immediate medical attention.    Falls Interventions/Joint Pain:   Reviewed plan for safety and fall prevention. She continues to experience fatigue. Reports practicing caution with all ambulation. She is scheduled for a joint injection at [facility]/Dr Leithe-Gray later this month. Denies increased pain or discomfort. Remains independent with ADLs and small task in the home. Will follow up after her injection to determine if additional assistance is needed.  Wellness Interventions: (New Goal) Discussed recent episodes of decreased energy and fatigue. Patient reports lack of energy and motivation over the past few weeks. Thorough discussion regarding life changes over the past year including unexpected illnesses and loss of family members. Discussed options for community engagement. Declined  current need for grief support, counseling or community referrals. Agrees that she would benefit from more socialization. Recent outings have consisted mostly of medical appointments. She reports very good support from family and friends and agreed to reach out if needed. Reports appetite and ability to function in the home has not changed. She remains very motivated to maintain her health and independence.  She agreed to follow up to discuss. Agreed to call or contact the care management team with changes or urgent concerns as needed.   Patient Goals/Self-Care Activities: Take all medications as prescribed Attend all scheduled provider appointments Call pharmacy for medication refills 3-7 days in advance of running out of medications Perform all self care activities independently  Call provider office for new concerns or questions     Follow Up Plan:   Will follow up within the next month.     PLAN: A member of the care management team will follow up within the next month.   [name] Care Management [facility] [phone]

## 2022-02-01 ENCOUNTER — Ambulatory Visit: Payer: Medicare HMO

## 2022-02-01 ENCOUNTER — Other Ambulatory Visit: Payer: Self-pay | Admitting: Physician Assistant

## 2022-02-01 ENCOUNTER — Ambulatory Visit: Payer: Self-pay

## 2022-02-01 DIAGNOSIS — B370 Candidal stomatitis: Secondary | ICD-10-CM

## 2022-02-01 DIAGNOSIS — K580 Irritable bowel syndrome with diarrhea: Secondary | ICD-10-CM

## 2022-02-01 DIAGNOSIS — J449 Chronic obstructive pulmonary disease, unspecified: Secondary | ICD-10-CM

## 2022-02-01 NOTE — Telephone Encounter (Signed)
Chief Complaint: White spots in mouth Symptoms: No pain Frequency: Onset Wednesday after steroid injection on Tuesday Pertinent Negatives: Patient denies pain Disposition: [] ED /[] Urgent Care (no appt availability in office) / [x] Appointment(In office/virtual)/ []  [facility]/ [] Home Care/ [] Refused Recommended Disposition /[] [facility]/ []  Follow-up with PCP Additional Notes: N/A   Summary: white spots in her mouth   Pt wen to [facility] this week and had a back inj for pain she has since broke out in white spots in her mouth.  She has not ever broke out before but she has had a 2 year break in the injections.   CB [phone]       Reason for Disposition  [1] White patches that stick to tongue or inner cheek AND [2] can be wiped off  Answer Assessment - Initial Assessment Questions 1. SYMPTOM: "What's the main symptom you're concerned about?" (e.g., chapped lips, dry mouth, lump, sores)     White patches all in mouth 2. ONSET: "When did the patches start?"     01/30/22 3. PAIN: "Is there any pain?" If Yes, ask: "How bad is it?" (Scale: 1-10; mild, moderate, severe)   - MILD (1-3):  doesn't interfere with eating or normal activities   - MODERATE (4-7): interferes with eating some solids and normal activities   - SEVERE (8-10):  excruciating pain, interferes with most normal activities   - SEVERE DYSPHAGIA: can't swallow liquids, drooling     No 4. CAUSE: "What do you think is causing the symptoms?"     Steroid injection Tuesday 01/29/22 5. OTHER SYMPTOMS: "Do you have any other symptoms?" (e.g., fever, sore throat, toothache, swelling)     No 6. PREGNANCY: "Is there any chance you are pregnant?" "When was your last menstrual period?"     N/A  Protocols used: Mouth Symptoms-A-AH

## 2022-02-04 ENCOUNTER — Ambulatory Visit: Payer: Medicare HMO | Admitting: Family Medicine

## 2022-02-07 ENCOUNTER — Other Ambulatory Visit: Payer: Self-pay | Admitting: Physician Assistant

## 2022-02-07 ENCOUNTER — Other Ambulatory Visit: Payer: Self-pay | Admitting: Internal Medicine

## 2022-02-07 DIAGNOSIS — R0689 Other abnormalities of breathing: Secondary | ICD-10-CM

## 2022-02-07 DIAGNOSIS — J9611 Chronic respiratory failure with hypoxia: Secondary | ICD-10-CM

## 2022-02-07 DIAGNOSIS — K580 Irritable bowel syndrome with diarrhea: Secondary | ICD-10-CM

## 2022-02-07 DIAGNOSIS — J449 Chronic obstructive pulmonary disease, unspecified: Secondary | ICD-10-CM

## 2022-02-07 NOTE — Chronic Care Management (AMB) (Signed)
Chronic Care Management   CCM RN Visit Note   Name: Jackie Turner MRN: [id number] DOB: [birth year]  Subjective: Jackie Turner is a 86 y.o. year old female who is a primary care patient of Bacigalupo, Dionne Bucy, MD. The care management team was consulted for assistance with disease management and care coordination needs.    Engaged with patient by telephone for follow up visit in response to provider referral for case management and care coordination services.   Consent to Services:  The patient was given information about Chronic Care Management services, agreed to services, and gave verbal consent prior to initiation of services.  Please see initial visit note for detailed documentation.   Assessment: Review of patient past medical history, allergies, medications, health status, including review of consultants reports, laboratory and other test data, was performed as part of comprehensive evaluation and provision of chronic care management services.   SDOH (Social Determinants of Health) assessments and interventions performed: No  CCM Care Plan  Allergies  Allergen Reactions   Nsaids Other (See Comments)    History of gastric ulcer   Amoxicillin Nausea Only and Rash   Ibandronic Acid Other (See Comments)    Muscle weakness - advised not to take  **BONIVA** - drug name   Actonel  [Risedronate] Other (See Comments)    Gastric ulcers   Antihistamines, Chlorpheniramine-Type    Aspirin     Gastric ulcer   Buspar [Buspirone] Other (See Comments)    Pt does not remember reaction    Butalbital-Aspirin-Caffeine Other (See Comments)    Other reaction(s): Unknown   Clarithromycin Other (See Comments)    Bloating  *BIAXIN*   Codeine    Gatifloxacin Other (See Comments)   Hydrocodone-Guaifenesin Nausea Only    CODICLEAR DH STYRUP    Moxifloxacin    Oxycodone Other (See Comments)    Dizziness   Penicillins Other (See Comments)   Risedronate Sodium     Gastric  ulcer   Tylenol With Codeine #3  [Acetaminophen-Codeine]     Other reaction(s): Unknown   Azithromycin Other (See Comments)    Pt states med causes severe abd pain   Raloxifene Other (See Comments)    Bloating Bloating  *EVISTA*    Outpatient Encounter Medications as of 02/01/2022  Medication Sig Note   albuterol (PROVENTIL) (2.5 MG/3ML) 0.083% nebulizer solution Take 3 mLs (2.5 mg total) by nebulization every 6 (six) hours as needed for wheezing or shortness of breath. 06/29/2021: Reports not currently using.   Calcium Carb-Cholecalciferol 600-400 MG-UNIT TABS Take by mouth.    cetirizine (ZYRTEC) 10 MG tablet Take 5mg  daily for 1-2 weeks. Then increase to 10mg  daily if tolerating well.    dicyclomine (BENTYL) 10 MG capsule TAKE 1 CAPSULE BY MOUTH 3 TIMES DAILY ASNEEDED FOR SPASMS    doxepin (SINEQUAN) 10 MG capsule TAKE 3 CAPSULES BY MOUTH AT BEDTIME    feeding supplement, ENSURE ENLIVE, (ENSURE ENLIVE) LIQD Take 237 mLs by mouth 2 (two) times daily between meals.    fluticasone (FLONASE) 50 MCG/ACT nasal spray Place 2 sprays into both nostrils daily.    furosemide (LASIX) 40 MG tablet Take 1 tablet (40 mg total) by mouth daily as needed for edema.    melatonin 3 MG TABS tablet Take 1 tablet (3 mg total) by mouth at bedtime as needed.    nystatin (MYCOSTATIN) 100000 UNIT/ML suspension TAKE 5 MLS BY MOUTH 4 TIMES A DAY.    oxybutynin (DITROPAN XL) 15 MG 24 hr  tablet Take 1 tablet (15 mg total) by mouth at bedtime.    OXYGEN Inhale 2 L into the lungs daily.    pentosan polysulfate (ELMIRON) 100 MG capsule Take 1 capsule (100 mg total) by mouth 2 (two) times daily as needed (urinary pain).    traMADol (ULTRAM) 50 MG tablet Take 1 tablet (50 mg total) by mouth every 8 (eight) hours as needed. for pain    TRELEGY ELLIPTA 100-62.5-25 MCG/INH AEPB INHALE 1 PUFF INTO THE LUNGS DAILY    VENTOLIN HFA 108 (90 Base) MCG/ACT inhaler Inhale 2 puffs into the lungs every 4 (four) hours as needed for  wheezing or shortness of breath.    verapamil (CALAN-SR) 120 MG CR tablet TAKE 1 TABLET BY MOUTH AT BEDTIME    [DISCONTINUED] dicyclomine (BENTYL) 10 MG capsule Take 1 capsule (10 mg total) by mouth 3 (three) times daily as needed for spasms.    No facility-administered encounter medications on file as of 02/01/2022.    Patient Active Problem List   Diagnosis Date Noted   Insomnia 11/19/2021   Primary hypertension 06/05/2021   Atrial fibrillation with rapid ventricular response ([place]) 03/20/2021   Microcytic anemia 03/19/2021   Iron deficiency 03/19/2021   A-fib ([place]) 03/19/2021   Memory loss 11/21/2020   Polyp of colon    Acute blood loss anemia 08/09/2020   GI bleeding 08/04/2020   Depression, major, single episode, in partial remission ([place]) 06/22/2020   Atrial fibrillation with RVR ([place]) 06/13/2020   Chronic respiratory failure with hypoxia ([place]) 06/13/2020   Right hand pain 06/13/2020   Fall at home, initial encounter 06/13/2020   COPD (chronic obstructive pulmonary disease) ([place]) 06/13/2020   Bronchiectasis without complication ([place]) 62/83/1517   Lymphedema 05/17/2019   Chronic venous insufficiency 05/17/2019   Swelling of limb 02/10/2018   Varicose veins of leg with swelling, bilateral 02/10/2018   Squamous cell carcinoma 09/22/2017   Hypoxia 09/27/2015   Abnormal CXR 09/27/2015   Asthma with allergic rhinitis 08/31/2015   Back ache 08/31/2015   Chronic interstitial cystitis 08/31/2015   Grief reaction 08/31/2015   Hyperlipidemia 06/26/2015   Migraine 06/26/2015   Asthma 06/26/2015   IBS (irritable bowel syndrome) 06/26/2015   Osteoporosis 06/26/2015   Fatigue 06/26/2015   Lumbar radiculopathy 04/27/2013   Urinary urgency 01/08/2012   Frequency of urination 01/08/2012    Patient Care Plan: RN Care Management Plan of Care     Problem Identified: COPD and Joint Pain      Long-Range Goal: Disease Progression Minimized or Managed   Start Date: 11/14/2021   Expected End Date: 02/12/2022  Priority: High  Note:   Current Barriers:  Chronic Disease Management support and education needs related to COPD and Fall Risk r/t Joint Pain.  RNCM Clinical Goal(s):  Patient will demonstrate ongoing adherence to prescribed treatment plan for COPD and Fall Risk r/t Joint Pain through collaboration with the provider and care management team.  Interventions: 1:1 collaboration with primary care provider regarding development and update of comprehensive plan of care as evidenced by provider attestation and co-signature Inter-disciplinary care team collaboration (see longitudinal plan of care) Evaluation of current treatment plan related to  self management and patient's adherence to plan as established by provider   COPD Interventions:   Reviewed plan for COPD management. Reports feeling fatigued and experiencing a non-productive cough today. She was evaluated in the clinic on 01/22/2022 and medications were ordered. Reports she opted not to take additional medications but will follow up  if her cough worsens. She continues to experience shortness of breath with exertion. Denies symptoms at rest. Reports using inhalers and supplemental oxygen as advised.  Reviewed indications for seeking immediate medical attention.    Falls Interventions/Joint Pain:   Reviewed plan for safety and fall prevention. She continues to experience fatigue. Reports practicing caution with all ambulation. She is scheduled for a joint injection at [facility]/Dr Leithe-Gray later this month. Denies increased pain or discomfort. Remains independent with ADLs and small task in the home. Will follow up after her injection to determine if additional assistance is needed.  Wellness Interventions:  Discussed recent episodes of decreased energy and fatigue. Patient reports lack of energy and motivation over the past few weeks. Thorough discussion regarding life changes over the past year including  unexpected illnesses and loss of family members. Discussed options for community engagement. Declined current need for grief support, counseling or community referrals. Agrees that she would benefit from more socialization. Recent outings have consisted mostly of medical appointments. She reports very good support from family and friends and agreed to reach out if needed. Reports appetite and ability to function in the home has not changed. She remains very motivated to maintain her health and independence.  She agreed to follow up to discuss. Agreed to call or contact the care management team with changes or urgent concerns as needed. Update 02/01/2022: Patient called regarding changes in oral mucosa. Reports noticing small white bumps in her mouth shortly after receiving a steroid joint injection. Denies oral pain. Denies difficulty chewing or swallowing. Reports contacting the clinic earlier today. Reports performing mouth rinses every few hours with a warm salt water solution.  Thoroughly reviewed indications for seeking medical follow up. Agreed to seek evaluation at the closest Urgent Care if condition worsens or she develops oral pain/discomfort over the weekend.   Patient Goals/Self-Care Activities: Take all medications as prescribed Attend all scheduled provider appointments Call pharmacy for medication refills 3-7 days in advance of running out of medications Perform all self care activities independently  Call provider office for new concerns or questions         PLAN A member of the care management team will follow up next month.   [name] [phone]

## 2022-02-12 DIAGNOSIS — J449 Chronic obstructive pulmonary disease, unspecified: Secondary | ICD-10-CM

## 2022-02-18 ENCOUNTER — Ambulatory Visit (INDEPENDENT_AMBULATORY_CARE_PROVIDER_SITE_OTHER): Payer: Medicare HMO

## 2022-02-18 DIAGNOSIS — J449 Chronic obstructive pulmonary disease, unspecified: Secondary | ICD-10-CM

## 2022-02-18 DIAGNOSIS — R059 Cough, unspecified: Secondary | ICD-10-CM

## 2022-02-18 DIAGNOSIS — R5383 Other fatigue: Secondary | ICD-10-CM

## 2022-02-18 NOTE — Chronic Care Management (AMB) (Unsigned)
Chronic Care Management   CCM RN Visit Note  02/18/2022 Name: Jackie Turner MRN: [id number] DOB: 1936/09/21  Subjective: Jackie Turner is a 86 y.o. year old female who is a primary care patient of Bacigalupo, Dionne Bucy, MD. The care management team was consulted for assistance with disease management and care coordination needs.    Engaged with patient by telephone for follow up visit in response to provider referral for case management and care coordination services.   Consent to Services:  The patient was given information about Chronic Care Management services, agreed to services, and gave verbal consent prior to initiation of services.  Please see initial visit note for detailed documentation.    Assessment: Review of patient past medical history, allergies, medications, health status, including review of consultants reports, laboratory and other test data, was performed as part of comprehensive evaluation and provision of chronic care management services.   SDOH (Social Determinants of Health) assessments and interventions performed: No  CCM Care Plan  Allergies  Allergen Reactions   Nsaids Other (See Comments)    History of gastric ulcer   Amoxicillin Nausea Only and Rash   Ibandronic Acid Other (See Comments)    Muscle weakness - advised not to take  **BONIVA** - drug name   Actonel  [Risedronate] Other (See Comments)    Gastric ulcers   Antihistamines, Chlorpheniramine-Type    Aspirin     Gastric ulcer   Buspar [Buspirone] Other (See Comments)    Pt does not remember reaction    Butalbital-Aspirin-Caffeine Other (See Comments)    Other reaction(s): Unknown   Clarithromycin Other (See Comments)    Bloating  *BIAXIN*   Codeine    Gatifloxacin Other (See Comments)   Hydrocodone-Guaifenesin Nausea Only    CODICLEAR DH STYRUP    Moxifloxacin    Oxycodone Other (See Comments)    Dizziness   Penicillins Other (See Comments)   Risedronate Sodium      Gastric ulcer   Tylenol With Codeine #3  [Acetaminophen-Codeine]     Other reaction(s): Unknown   Azithromycin Other (See Comments)    Pt states med causes severe abd pain   Raloxifene Other (See Comments)    Bloating Bloating  *EVISTA*    Outpatient Encounter Medications as of 02/18/2022  Medication Sig   albuterol (PROVENTIL) (2.5 MG/3ML) 0.083% nebulizer solution USE 3 ML BY NEBULIZATION EVERY 6 HOURS AS NEEDED FOR WHEEZING OR SHORTNESS OF BREATH   Calcium Carb-Cholecalciferol 600-400 MG-UNIT TABS Take by mouth.   cetirizine (ZYRTEC) 10 MG tablet Take '5mg'$  daily for 1-2 weeks. Then increase to '10mg'$  daily if tolerating well.   dicyclomine (BENTYL) 10 MG capsule TAKE 1 CAPSULE BY MOUTH 3 TIMES DAILY ASNEEDED FOR SPASMS   doxepin (SINEQUAN) 10 MG capsule TAKE 3 CAPSULES BY MOUTH AT BEDTIME   feeding supplement, ENSURE ENLIVE, (ENSURE ENLIVE) LIQD Take 237 mLs by mouth 2 (two) times daily between meals.   fluticasone (FLONASE) 50 MCG/ACT nasal spray Place 2 sprays into both nostrils daily.   furosemide (LASIX) 40 MG tablet Take 1 tablet (40 mg total) by mouth daily as needed for edema.   melatonin 3 MG TABS tablet Take 1 tablet (3 mg total) by mouth at bedtime as needed.   nystatin (MYCOSTATIN) 100000 UNIT/ML suspension TAKE 5 MLS BY MOUTH 4 TIMES A DAY.   oxybutynin (DITROPAN XL) 15 MG 24 hr tablet Take 1 tablet (15 mg total) by mouth at bedtime.   OXYGEN Inhale 2 L into  the lungs daily.   pentosan polysulfate (ELMIRON) 100 MG capsule Take 1 capsule (100 mg total) by mouth 2 (two) times daily as needed (urinary pain).   traMADol (ULTRAM) 50 MG tablet Take 1 tablet (50 mg total) by mouth every 8 (eight) hours as needed. for pain   TRELEGY ELLIPTA 100-62.5-25 MCG/INH AEPB INHALE 1 PUFF INTO THE LUNGS DAILY   VENTOLIN HFA 108 (90 Base) MCG/ACT inhaler Inhale 2 puffs into the lungs every 4 (four) hours as needed for wheezing or shortness of breath.   verapamil (CALAN-SR) 120 MG CR tablet TAKE  1 TABLET BY MOUTH AT BEDTIME   No facility-administered encounter medications on file as of 02/18/2022.    Patient Active Problem List   Diagnosis Date Noted   Insomnia 11/19/2021   Primary hypertension 06/05/2021   Atrial fibrillation with rapid ventricular response ([place]) 03/20/2021   Microcytic anemia 03/19/2021   Iron deficiency 03/19/2021   A-fib ([place]) 03/19/2021   Memory loss 11/21/2020   Polyp of colon    Acute blood loss anemia 08/09/2020   GI bleeding 08/04/2020   Depression, major, single episode, in partial remission ([place]) 06/22/2020   Atrial fibrillation with RVR ([place]) 06/13/2020   Chronic respiratory failure with hypoxia ([place]) 06/13/2020   Right hand pain 06/13/2020   Fall at home, initial encounter 06/13/2020   COPD (chronic obstructive pulmonary disease) ([place]) 06/13/2020   Bronchiectasis without complication ([place]) 40/98/1191   Lymphedema 05/17/2019   Chronic venous insufficiency 05/17/2019   Swelling of limb 02/10/2018   Varicose veins of leg with swelling, bilateral 02/10/2018   Squamous cell carcinoma 09/22/2017   Hypoxia 09/27/2015   Abnormal CXR 09/27/2015   Asthma with allergic rhinitis 08/31/2015   Back ache 08/31/2015   Chronic interstitial cystitis 08/31/2015   Grief reaction 08/31/2015   Hyperlipidemia 06/26/2015   Migraine 06/26/2015   Asthma 06/26/2015   IBS (irritable bowel syndrome) 06/26/2015   Osteoporosis 06/26/2015   Fatigue 06/26/2015   Lumbar radiculopathy 04/27/2013   Urinary urgency 01/08/2012   Frequency of urination 01/08/2012   Plan:

## 2022-02-28 ENCOUNTER — Ambulatory Visit: Payer: Self-pay

## 2022-02-28 DIAGNOSIS — Z9181 History of falling: Secondary | ICD-10-CM

## 2022-02-28 NOTE — Chronic Care Management (AMB) (Signed)
?Chronic Care Management  ? ?CCM RN Visit Note ? ?02/28/2022 ?Name: Clarence Dunsmore MRN: [id number] DOB: 1936/01/16 ? ?Subjective: ?Carri Jerilee Space is a 86 y.o. year old female who is a primary care patient of Bacigalupo, Dionne Bucy, MD. The care management team was consulted for assistance with disease management and care coordination needs.   ? ?Engaged with patient by telephone for follow up visit in response to provider referral for case management and care coordination services.  ? ?Consent to Services:  ?The patient was given information about Chronic Care Management services, agreed to services, and gave verbal consent prior to initiation of services.  Please see initial visit note for detailed documentation.  ? ?Assessment: Review of patient past medical history, allergies, medications, health status, including review of consultants reports, laboratory and other test data, was performed as part of comprehensive evaluation and provision of chronic care management services.  ? ?SDOH (Social Determinants of Health) assessments and interventions performed: No ? ?CCM Care Plan ? ?Allergies  ?Allergen Reactions  ? Nsaids Other (See Comments)  ?  History of gastric ulcer  ? Amoxicillin Nausea Only and Rash  ? Ibandronic Acid Other (See Comments)  ?  Muscle weakness - advised not to take ? ?**BONIVA** - drug name  ? Actonel  [Risedronate] Other (See Comments)  ?  Gastric ulcers  ? Antihistamines, Chlorpheniramine-Type   ? Aspirin   ?  Gastric ulcer  ? Buspar [Buspirone] Other (See Comments)  ?  Pt does not remember reaction   ? Butalbital-Aspirin-Caffeine Other (See Comments)  ?  Other reaction(s): Unknown  ? Clarithromycin Other (See Comments)  ?  Bloating ? ?[place]*  ? Codeine   ? Gatifloxacin Other (See Comments)  ? Hydrocodone-Guaifenesin Nausea Only  ?  CODICLEAR [place]   ? Moxifloxacin   ? Oxycodone Other (See Comments)  ?  Dizziness  ? Penicillins Other (See Comments)  ? Risedronate Sodium   ?   Gastric ulcer  ? Tylenol With Codeine #3  [Acetaminophen-Codeine]   ?  Other reaction(s): Unknown  ? Azithromycin Other (See Comments)  ?  Pt states med causes severe abd pain  ? Raloxifene Other (See Comments)  ?  Bloating ?Bloating ? ?*EVISTA*  ? ? ?Outpatient Encounter Medications as of 02/28/2022  ?Medication Sig  ? albuterol (PROVENTIL) (2.5 MG/3ML) 0.083% nebulizer solution USE 3 ML BY NEBULIZATION EVERY 6 HOURS AS NEEDED FOR WHEEZING OR SHORTNESS OF BREATH  ? Calcium Carb-Cholecalciferol 600-400 MG-UNIT TABS Take by mouth.  ? cetirizine (ZYRTEC) 10 MG tablet Take '5mg'$  daily for 1-2 weeks. Then increase to '10mg'$  daily if tolerating well.  ? dicyclomine (BENTYL) 10 MG capsule TAKE 1 CAPSULE BY MOUTH 3 TIMES DAILY ASNEEDED FOR SPASMS  ? doxepin (SINEQUAN) 10 MG capsule TAKE 3 CAPSULES BY MOUTH AT BEDTIME  ? feeding supplement, ENSURE ENLIVE, (ENSURE ENLIVE) LIQD Take 237 mLs by mouth 2 (two) times daily between meals.  ? fluticasone (FLONASE) 50 MCG/ACT nasal spray Place 2 sprays into both nostrils daily.  ? furosemide (LASIX) 40 MG tablet Take 1 tablet (40 mg total) by mouth daily as needed for edema.  ? melatonin 3 MG TABS tablet Take 1 tablet (3 mg total) by mouth at bedtime as needed.  ? nystatin (MYCOSTATIN) 100000 UNIT/ML suspension TAKE 5 MLS BY MOUTH 4 TIMES A DAY.  ? oxybutynin (DITROPAN XL) 15 MG 24 hr tablet Take 1 tablet (15 mg total) by mouth at bedtime.  ? OXYGEN Inhale 2 L into the  lungs daily.  ? pentosan polysulfate (ELMIRON) 100 MG capsule Take 1 capsule (100 mg total) by mouth 2 (two) times daily as needed (urinary pain).  ? traMADol (ULTRAM) 50 MG tablet Take 1 tablet (50 mg total) by mouth every 8 (eight) hours as needed. for pain  ? TRELEGY ELLIPTA 100-62.5-25 MCG/INH AEPB INHALE 1 PUFF INTO THE LUNGS DAILY  ? VENTOLIN HFA 108 (90 Base) MCG/ACT inhaler Inhale 2 puffs into the lungs every 4 (four) hours as needed for wheezing or shortness of breath.  ? verapamil (CALAN-SR) 120 MG CR tablet TAKE  1 TABLET BY MOUTH AT BEDTIME  ? ?No facility-administered encounter medications on file as of 02/28/2022.  ? ? ?Patient Active Problem List  ? Diagnosis Date Noted  ? Insomnia 11/19/2021  ? Primary hypertension 06/05/2021  ? Atrial fibrillation with rapid ventricular response ([place]) 03/20/2021  ? Microcytic anemia 03/19/2021  ? Iron deficiency 03/19/2021  ? A-fib ([place]) 03/19/2021  ? Memory loss 11/21/2020  ? Polyp of colon   ? Acute blood loss anemia 08/09/2020  ? GI bleeding 08/04/2020  ? Depression, major, single episode, in partial remission ([place]) 06/22/2020  ? Atrial fibrillation with RVR ([place]) 06/13/2020  ? Chronic respiratory failure with hypoxia ([place]) 06/13/2020  ? Right hand pain 06/13/2020  ? Fall at home, initial encounter 06/13/2020  ? COPD (chronic obstructive pulmonary disease) ([place]) 06/13/2020  ? Bronchiectasis without complication ([place]) 14/78/2956  ? Lymphedema 05/17/2019  ? Chronic venous insufficiency 05/17/2019  ? Swelling of limb 02/10/2018  ? Varicose veins of leg with swelling, bilateral 02/10/2018  ? Squamous cell carcinoma 09/22/2017  ? Hypoxia 09/27/2015  ? Abnormal CXR 09/27/2015  ? Asthma with allergic rhinitis 08/31/2015  ? Back ache 08/31/2015  ? Chronic interstitial cystitis 08/31/2015  ? Grief reaction 08/31/2015  ? Hyperlipidemia 06/26/2015  ? Migraine 06/26/2015  ? Asthma 06/26/2015  ? IBS (irritable bowel syndrome) 06/26/2015  ? Osteoporosis 06/26/2015  ? Fatigue 06/26/2015  ? Lumbar radiculopathy 04/27/2013  ? Urinary urgency 01/08/2012  ? Frequency of urination 01/08/2012  ? ? ?Patient Care Plan: RN Care Management Plan of Care  ?  ? ?Problem Identified: COPD and Joint Pain   ?  ? ?Long-Range Goal: Disease Progression Minimized or Managed   ?Start Date: 02/18/2022  ?Expected End Date: 05/19/2022  ?Priority: High  ?Note:   ?Current Barriers:  ?Chronic Disease Management support and education needs related to COPD and Fall Risk r/t Joint Pain. ? ?RNCM Clinical Goal(s):  ?Patient will  demonstrate ongoing adherence to prescribed treatment plan for COPD and Fall Risk r/t Joint Pain through collaboration with the provider and care management team. ? ?Interventions: ?1:1 collaboration with primary care provider regarding development and update of comprehensive plan of care as evidenced by provider attestation and co-signature ?Inter-disciplinary care team collaboration (see longitudinal plan of care) ?Evaluation of current treatment plan related to  self management and patient's adherence to plan as established by provider ? ? ?COPD Interventions:   ?Reviewed plan for COPD management. ?Discussed symptoms. Continues to experience a productive cough mostly at night. Reports sputum remains thick. Reports color varies. Continues to use her inhaler and nebulizer as recommended. Reports slight improvements when using her humidifier. She is scheduled to follow up with the Pulmonology team on 04/08/22.  ?Reports changes in oral mucosa. Reports her mouth feel sore. Denies open lesions. Denies sore throat or difficulty swallowing. Reports eating without difficulty. Acute visit offered. Patient declined. Reports she will continue warm salt water rinses. Agreed  to contact the clinic if symptoms worsen. ?Thorough discussion regarding worsening symptoms and indications that require immediate medical attention. ? ?Falls Interventions/Joint Pain:   ?Reviewed plan for safety and fall prevention. Denies recent falls. Reports following recommended precautions to prevent accidental falls. Continues to experience fatigue but reports improvements with pain since receiving joint injections. Discussed ability to perform task in the home. Remains independent in the home. Reports very good support from family and friends. Denies need for additional assistance.  ? ?Wellness Interventions:  ?Discussed recent episodes of decreased energy and fatigue. Patient reports lack of energy and motivation over the past few weeks. Thorough  discussion regarding life changes over the past year including unexpected illnesses and loss of family members. Discussed options for community engagement. Declined current need for grief support, counselin

## 2022-02-28 NOTE — Patient Instructions (Signed)
Thank you for allowing the Chronic Care Management team to participate in your care. It was great speaking with you today! °

## 2022-03-01 ENCOUNTER — Other Ambulatory Visit: Payer: Self-pay | Admitting: Family Medicine

## 2022-03-01 DIAGNOSIS — I4891 Unspecified atrial fibrillation: Secondary | ICD-10-CM

## 2022-03-01 NOTE — Telephone Encounter (Signed)
Requested Prescriptions  ?Pending Prescriptions Disp Refills  ?? verapamil (CALAN-SR) 120 MG CR tablet [Pharmacy Med Name: VERAPAMIL HCL ER 120 MG TAB] 90 tablet 0  ?  Sig: TAKE 1 TABLET BY MOUTH AT BEDTIME  ?  ? Cardiovascular: Calcium Channel Blockers 3 Failed - 03/01/2022  4:52 PM  ?  ?  Failed - Last BP in normal range  ?  BP Readings from Last 1 Encounters:  ?01/22/22 (!) 132/94  ?   ?  ?  Passed - ALT in normal range and within 360 days  ?  ALT  ?Date Value Ref Range Status  ?06/28/2021 21 0 - 44 U/L Final  ?   ?  ?  Passed - AST in normal range and within 360 days  ?  AST  ?Date Value Ref Range Status  ?06/28/2021 24 15 - 41 U/L Final  ?   ?  ?  Passed - Cr in normal range and within 360 days  ?  Creatinine, Ser  ?Date Value Ref Range Status  ?11/16/2021 0.72 0.44 - 1.00 mg/dL Final  ?   ?  ?  Passed - Last Heart Rate in normal range  ?  Pulse Readings from Last 1 Encounters:  ?01/22/22 88  ?   ?  ?  Passed - Valid encounter within last 6 months  ?  Recent Outpatient Visits   ?      ? 1 month ago Cough in adult  ? [place], PA-C  ? 3 months ago Lumbar radiculopathy  ? [facility] [place], Dionne Bucy, MD  ? 4 months ago Acute maxillary sinusitis, recurrence not specified  ? [place], MD  ? 7 months ago Dry heaves  ? [place], PA-C  ? 8 months ago Atrial fibrillation with RVR ([place])  ? [facility] [place], Dionne Bucy, MD  ?  ?  ? ?  ?  ?  ? ? ?

## 2022-03-07 ENCOUNTER — Ambulatory Visit: Payer: Self-pay

## 2022-03-07 DIAGNOSIS — J449 Chronic obstructive pulmonary disease, unspecified: Secondary | ICD-10-CM

## 2022-03-07 NOTE — Chronic Care Management (AMB) (Signed)
Error Please Disregard

## 2022-03-13 ENCOUNTER — Other Ambulatory Visit: Payer: Self-pay | Admitting: *Deleted

## 2022-03-13 DIAGNOSIS — E611 Iron deficiency: Secondary | ICD-10-CM

## 2022-03-15 DIAGNOSIS — J449 Chronic obstructive pulmonary disease, unspecified: Secondary | ICD-10-CM

## 2022-03-19 ENCOUNTER — Inpatient Hospital Stay: Payer: Medicare HMO | Attending: Nurse Practitioner

## 2022-03-19 ENCOUNTER — Encounter: Payer: Self-pay | Admitting: Nurse Practitioner

## 2022-03-19 ENCOUNTER — Inpatient Hospital Stay: Payer: Medicare HMO | Admitting: Nurse Practitioner

## 2022-03-19 ENCOUNTER — Inpatient Hospital Stay: Payer: Medicare HMO

## 2022-03-19 VITALS — BP 141/89 | HR 97 | Temp 98.6°F | Ht <= 58 in | Wt 116.2 lb

## 2022-03-19 DIAGNOSIS — Z7901 Long term (current) use of anticoagulants: Secondary | ICD-10-CM | POA: Insufficient documentation

## 2022-03-19 DIAGNOSIS — Z79899 Other long term (current) drug therapy: Secondary | ICD-10-CM | POA: Diagnosis not present

## 2022-03-19 DIAGNOSIS — I4891 Unspecified atrial fibrillation: Secondary | ICD-10-CM | POA: Diagnosis not present

## 2022-03-19 DIAGNOSIS — E611 Iron deficiency: Secondary | ICD-10-CM | POA: Diagnosis not present

## 2022-03-19 DIAGNOSIS — J449 Chronic obstructive pulmonary disease, unspecified: Secondary | ICD-10-CM | POA: Diagnosis not present

## 2022-03-19 DIAGNOSIS — D509 Iron deficiency anemia, unspecified: Secondary | ICD-10-CM | POA: Diagnosis not present

## 2022-03-19 LAB — FERRITIN: Ferritin: 35 ng/mL (ref 11–307)

## 2022-03-19 LAB — CBC WITH DIFFERENTIAL/PLATELET
Abs Immature Granulocytes: 0.09 10*3/uL — ABNORMAL HIGH (ref 0.00–0.07)
Basophils Absolute: 0.1 10*3/uL (ref 0.0–0.1)
Basophils Relative: 1 %
Eosinophils Absolute: 0.5 10*3/uL (ref 0.0–0.5)
Eosinophils Relative: 5 %
HCT: 40.6 % (ref 36.0–46.0)
Hemoglobin: 12.5 g/dL (ref 12.0–15.0)
Immature Granulocytes: 1 %
Lymphocytes Relative: 11 %
Lymphs Abs: 1.1 10*3/uL (ref 0.7–4.0)
MCH: 27.8 pg (ref 26.0–34.0)
MCHC: 30.8 g/dL (ref 30.0–36.0)
MCV: 90.2 fL (ref 80.0–100.0)
Monocytes Absolute: 0.8 10*3/uL (ref 0.1–1.0)
Monocytes Relative: 8 %
Neutro Abs: 7.8 10*3/uL — ABNORMAL HIGH (ref 1.7–7.7)
Neutrophils Relative %: 74 %
Platelets: 400 10*3/uL (ref 150–400)
RBC: 4.5 MIL/uL (ref 3.87–5.11)
RDW: 15.5 % (ref 11.5–15.5)
WBC: 10.4 10*3/uL (ref 4.0–10.5)
nRBC: 0 % (ref 0.0–0.2)

## 2022-03-19 LAB — BASIC METABOLIC PANEL
Anion gap: 7 (ref 5–15)
BUN: 22 mg/dL (ref 8–23)
CO2: 36 mmol/L — ABNORMAL HIGH (ref 22–32)
Calcium: 9.7 mg/dL (ref 8.9–10.3)
Chloride: 96 mmol/L — ABNORMAL LOW (ref 98–111)
Creatinine, Ser: 0.75 mg/dL (ref 0.44–1.00)
GFR, Estimated: >60 mL/min (ref >60)
Glucose, Bld: 146 mg/dL — ABNORMAL HIGH (ref 70–99)
Potassium: 4.7 mmol/L (ref 3.5–5.1)
Sodium: 139 mmol/L (ref 135–145)

## 2022-03-19 LAB — IRON AND TIBC
Iron: 59 ug/dL (ref 28–170)
Saturation Ratios: 13 % (ref 10.4–31.8)
TIBC: 451 ug/dL — ABNORMAL HIGH (ref 250–450)
UIBC: 392 ug/dL

## 2022-03-19 NOTE — Progress Notes (Signed)
[place] ?CONSULT NOTE ? ?Patient Care Team: ?Virginia Crews, MD as PCP - General (Family Medicine) ?Domingo Pulse, MD as Consulting Physician (Urology) ?Judi Cong, MD as Physician Assistant (Endocrinology) ?Theresa Duty, MD (Radiology) ?Jules Husbands, MD as Consulting Physician (General Surgery) ?Flora Lipps, MD as Consulting Physician (Pulmonary Disease) ?Isaias Cowman, MD as Consulting Physician (Cardiology) ?Estill Cotta, MD (Ophthalmology) ?Neldon Labella, RN as Case Manager ?Cammie Sickle, MD as Consulting Physician (Internal Medicine) ? ?CHIEF COMPLAINTS/PURPOSE OF CONSULTATION: ANEMIA ? ?HEMATOLOGY HISTORY: ? ?#Severe iron deficient anemia [name] 2022- 7.3] EGD-; colonoscopy- cecal polyp/adenoma [not resected] ? ?#COPD on 2 L of oxygen; history of A. Fib [Dr. Paraschos]-not on Eliquis. ? ?HISTORY OF PRESENTING ILLNESS:  ?Jackie Turner 86 y.o.  female with multiple medical problems including COPD on 2 L of oxygen, A. Fib, not on anticoagulation, returns to clinic for labs and follow up, and consideration of IV iron. No nausea, vomiting, abdominal pain. Has ongoing fatigue which is stable and unchanged.  ? ?Review of Systems  ?Constitutional:  Positive for malaise/fatigue. Negative for chills, diaphoresis and fever.  ?HENT:  Negative for nosebleeds and sore throat.   ?Eyes:  Negative for double vision.  ?Respiratory:  Positive for shortness of breath. Negative for cough, hemoptysis, sputum production and wheezing.   ?Cardiovascular:  Negative for chest pain, palpitations, orthopnea and leg swelling.  ?Gastrointestinal:  Negative for abdominal pain, blood in stool, constipation, diarrhea, heartburn, nausea and vomiting.  ?Genitourinary:  Negative for dysuria, frequency and urgency.  ?Musculoskeletal:  Positive for back pain and joint pain.  ?Skin: Negative.  Negative for itching and rash.  ?Neurological:  Negative for tingling, focal weakness,  weakness and headaches.  ?Endo/Heme/Allergies:  Does not bruise/bleed easily.  ?Psychiatric/Behavioral:  Negative for depression. The patient is not nervous/anxious and does not have insomnia.   ? ?MEDICAL HISTORY:  ?Past Medical History:  ?Diagnosis Date  ? Allergy   ? Asthma   ? Hyperlipidemia   ? Osteoporosis   ? ? ?SURGICAL HISTORY: ?Past Surgical History:  ?Procedure Laterality Date  ? ABDOMINAL HYSTERECTOMY  2003  ? [place]  ? benign  ? carpal tunnel repair Bilateral   ? R 1990; L 1997  ? COLONOSCOPY WITH PROPOFOL N/A 08/14/2020  ? Procedure: COLONOSCOPY WITH PROPOFOL;  Surgeon: Lucilla Lame, MD;  Location: [facility] ENDOSCOPY;  Service: Endoscopy;  Laterality: N/A;  ? ESOPHAGOGASTRODUODENOSCOPY N/A 08/12/2020  ? Procedure: ESOPHAGOGASTRODUODENOSCOPY (EGD);  Surgeon: Lin Landsman, MD;  Location: [facility] ENDOSCOPY;  Service: Gastroenterology;  Laterality: N/A;  ? EYE SURGERY Bilateral   ? cataract extraction  ? HAND TENDON SURGERY Right   ? Trigger finger  ? IR RADIOLOGIST EVAL & MGMT  06/13/2017  ? IR VERTEBROPLASTY CERV/THOR BX INC UNI/BIL INC/INJECT/IMAGING  06/17/2017  ? TONSILLECTOMY  1961  ? ? ?SOCIAL HISTORY: ?Social History  ? ?Socioeconomic History  ? Marital status: Widowed  ?  Spouse name: [name]  ? Number of children: 0  ? Years of education: H/S  ? Highest education level: 12th grade  ?Occupational History  ? Occupation: Retired  ?Tobacco Use  ? Smoking status: Never  ? Smokeless tobacco: Never  ?Vaping Use  ? Vaping Use: Never used  ?Substance and Sexual Activity  ? Alcohol use: No  ? Drug use: No  ? Sexual activity: Never  ?Other Topics Concern  ? Not on file  ?Social History Narrative  ? Not on file  ? ?Social Determinants  of Health  ? ?Financial Resource Strain: Low Risk   ? Difficulty of Paying Living Expenses: Not hard at all  ?Food Insecurity: No Food Insecurity  ? Worried About Charity fundraiser in the Last Year: Never true  ? Ran Out of Food in the Last Year: Never true   ?Transportation Needs: No Transportation Needs  ? Lack of Transportation (Medical): No  ? Lack of Transportation (Non-Medical): No  ?Physical Activity: Inactive  ? Days of Exercise per Week: 0 days  ? Minutes of Exercise per Session: 0 min  ?Stress: No Stress Concern Present  ? Feeling of Stress : Not at all  ?Social Connections: Unknown  ? Frequency of Communication with Friends and Family: Not on file  ? Frequency of Social Gatherings with Friends and Family: Once a week  ? Attends Religious Services: Never  ? Active Member of Clubs or Organizations: No  ? Attends Archivist Meetings: Never  ? Marital Status: Widowed  ?Intimate Partner Violence: Not At Risk  ? Fear of Current or Ex-Partner: No  ? Emotionally Abused: No  ? Physically Abused: No  ? Sexually Abused: No  ? ? ?FAMILY HISTORY: ?Family History  ?Problem Relation Age of Onset  ? Heart disease Mother   ? CAD Mother   ? Congestive Heart Failure Mother   ? Osteoarthritis Mother   ? Cancer Sister   ?     breast  ? Breast cancer Sister   ? ? ?ALLERGIES:  is allergic to nsaids; amoxicillin; ibandronic acid; actonel  [risedronate]; antihistamines, chlorpheniramine-type; aspirin; buspar [buspirone]; butalbital-aspirin-caffeine; clarithromycin; codeine; gatifloxacin; hydrocodone-guaifenesin; moxifloxacin; oxycodone; penicillins; risedronate sodium; tylenol with codeine #3  [acetaminophen-codeine]; azithromycin; and raloxifene. ? ?MEDICATIONS:  ?Current Outpatient Medications  ?Medication Sig Dispense Refill  ? albuterol (PROVENTIL) (2.5 MG/3ML) 0.083% nebulizer solution USE 3 ML BY NEBULIZATION EVERY 6 HOURS AS NEEDED FOR WHEEZING OR SHORTNESS OF BREATH 120 mL 2  ? Calcium Carb-Cholecalciferol 600-400 MG-UNIT TABS Take by mouth.    ? cetirizine (ZYRTEC) 10 MG tablet Take '5mg'$  daily for 1-2 weeks. Then increase to '10mg'$  daily if tolerating well. 30 tablet 3  ? dicyclomine (BENTYL) 10 MG capsule TAKE 1 CAPSULE BY MOUTH 3 TIMES DAILY ASNEEDED FOR SPASMS 90  capsule 1  ? doxepin (SINEQUAN) 10 MG capsule TAKE 3 CAPSULES BY MOUTH AT BEDTIME 270 capsule 1  ? feeding supplement, ENSURE ENLIVE, (ENSURE ENLIVE) LIQD Take 237 mLs by mouth 2 (two) times daily between meals. 237 mL 12  ? fluticasone (FLONASE) 50 MCG/ACT nasal spray Place 2 sprays into both nostrils daily. 16 g 5  ? furosemide (LASIX) 40 MG tablet Take 1 tablet (40 mg total) by mouth daily as needed for edema. 90 tablet 1  ? melatonin 3 MG TABS tablet Take 1 tablet (3 mg total) by mouth at bedtime as needed. 90 tablet 1  ? nystatin (MYCOSTATIN) 100000 UNIT/ML suspension TAKE 5 MLS BY MOUTH 4 TIMES A DAY. 60 mL 5  ? oxybutynin (DITROPAN XL) 15 MG 24 hr tablet Take 1 tablet (15 mg total) by mouth at bedtime. 90 tablet 1  ? OXYGEN Inhale 2 L into the lungs daily.    ? pentosan polysulfate (ELMIRON) 100 MG capsule Take 1 capsule (100 mg total) by mouth 2 (two) times daily as needed (urinary pain). 180 capsule 1  ? traMADol (ULTRAM) 50 MG tablet Take 1 tablet (50 mg total) by mouth every 8 (eight) hours as needed. for pain 90 tablet 5  ?  TRELEGY ELLIPTA 100-62.5-25 MCG/INH AEPB INHALE 1 PUFF INTO THE LUNGS DAILY 60 each 11  ? VENTOLIN HFA 108 (90 Base) MCG/ACT inhaler Inhale 2 puffs into the lungs every 4 (four) hours as needed for wheezing or shortness of breath. 18 g 3  ? verapamil (CALAN-SR) 120 MG CR tablet TAKE 1 TABLET BY MOUTH AT BEDTIME 90 tablet 0  ? ?No current facility-administered medications for this visit.  ? ? ?PHYSICAL EXAMINATION: ?Vitals:  ? 03/19/22 1301  ?BP: (!) 141/89  ?Pulse: 97  ?Temp: 98.6 ?F (37 ?C)  ?SpO2: 97%  ? ?Filed Weights  ? 03/19/22 1301  ?Weight: 116 lb 3.2 oz (52.7 kg)  ? ? ?Physical Exam ?Constitutional:   ?   Appearance: She is not ill-appearing.  ?   Interventions: Nasal cannula in place.  ?   Comments: Frail appearing. Accompanied.   ?Eyes:  ?   General: No scleral icterus. ?Cardiovascular:  ?   Rate and Rhythm: Normal rate. Rhythm irregular.  ?Abdominal:  ?   General: There is  no distension.  ?   Tenderness: There is no guarding.  ?Musculoskeletal:     ?   General: No deformity.  ?   Right lower leg: No edema.  ?   Left lower leg: No edema.  ?   Comments: ambulatory  ?Lymphadenopathy

## 2022-04-01 ENCOUNTER — Other Ambulatory Visit: Payer: Self-pay

## 2022-04-01 ENCOUNTER — Inpatient Hospital Stay
Admission: EM | Admit: 2022-04-01 | Discharge: 2022-04-12 | DRG: 871 | Disposition: A | Discharge status: Home / Self Care | Payer: Medicare HMO | Attending: Hospitalist | Admitting: Hospitalist

## 2022-04-01 ENCOUNTER — Encounter: Payer: Self-pay | Admitting: Emergency Medicine

## 2022-04-01 ENCOUNTER — Emergency Department: Payer: Medicare HMO

## 2022-04-01 DIAGNOSIS — K5000 Crohn's disease of small intestine without complications: Secondary | ICD-10-CM | POA: Clinically undetermined

## 2022-04-01 DIAGNOSIS — G8929 Other chronic pain: Secondary | ICD-10-CM | POA: Diagnosis present

## 2022-04-01 DIAGNOSIS — M810 Age-related osteoporosis without current pathological fracture: Secondary | ICD-10-CM | POA: Diagnosis present

## 2022-04-01 DIAGNOSIS — Z8249 Family history of ischemic heart disease and other diseases of the circulatory system: Secondary | ICD-10-CM

## 2022-04-01 DIAGNOSIS — K2990 Gastroduodenitis, unspecified, without bleeding: Secondary | ICD-10-CM | POA: Diagnosis present

## 2022-04-01 DIAGNOSIS — M4854XA Collapsed vertebra, not elsewhere classified, thoracic region, initial encounter for fracture: Secondary | ICD-10-CM | POA: Diagnosis present

## 2022-04-01 DIAGNOSIS — K567 Ileus, unspecified: Secondary | ICD-10-CM | POA: Diagnosis present

## 2022-04-01 DIAGNOSIS — Z881 Allergy status to other antibiotic agents status: Secondary | ICD-10-CM

## 2022-04-01 DIAGNOSIS — Z886 Allergy status to analgesic agent status: Secondary | ICD-10-CM

## 2022-04-01 DIAGNOSIS — D509 Iron deficiency anemia, unspecified: Secondary | ICD-10-CM | POA: Diagnosis present

## 2022-04-01 DIAGNOSIS — Z885 Allergy status to narcotic agent status: Secondary | ICD-10-CM

## 2022-04-01 DIAGNOSIS — K635 Polyp of colon: Secondary | ICD-10-CM | POA: Diagnosis present

## 2022-04-01 DIAGNOSIS — Z888 Allergy status to other drugs, medicaments and biological substances status: Secondary | ICD-10-CM

## 2022-04-01 DIAGNOSIS — Z7951 Long term (current) use of inhaled steroids: Secondary | ICD-10-CM

## 2022-04-01 DIAGNOSIS — Z9981 Dependence on supplemental oxygen: Secondary | ICD-10-CM

## 2022-04-01 DIAGNOSIS — Z79899 Other long term (current) drug therapy: Secondary | ICD-10-CM

## 2022-04-01 DIAGNOSIS — N3010 Interstitial cystitis (chronic) without hematuria: Secondary | ICD-10-CM

## 2022-04-01 DIAGNOSIS — J449 Chronic obstructive pulmonary disease, unspecified: Secondary | ICD-10-CM | POA: Diagnosis present

## 2022-04-01 DIAGNOSIS — R109 Unspecified abdominal pain: Secondary | ICD-10-CM | POA: Diagnosis not present

## 2022-04-01 DIAGNOSIS — R911 Solitary pulmonary nodule: Secondary | ICD-10-CM | POA: Diagnosis present

## 2022-04-01 DIAGNOSIS — E875 Hyperkalemia: Secondary | ICD-10-CM | POA: Diagnosis present

## 2022-04-01 DIAGNOSIS — A419 Sepsis, unspecified organism: Principal | ICD-10-CM | POA: Diagnosis present

## 2022-04-01 DIAGNOSIS — I4819 Other persistent atrial fibrillation: Secondary | ICD-10-CM | POA: Diagnosis present

## 2022-04-01 DIAGNOSIS — J479 Bronchiectasis, uncomplicated: Secondary | ICD-10-CM

## 2022-04-01 DIAGNOSIS — Z20822 Contact with and (suspected) exposure to covid-19: Secondary | ICD-10-CM | POA: Diagnosis present

## 2022-04-01 DIAGNOSIS — E785 Hyperlipidemia, unspecified: Secondary | ICD-10-CM | POA: Diagnosis present

## 2022-04-01 DIAGNOSIS — J9601 Acute respiratory failure with hypoxia: Secondary | ICD-10-CM | POA: Diagnosis present

## 2022-04-01 DIAGNOSIS — K50012 Crohn's disease of small intestine with intestinal obstruction: Secondary | ICD-10-CM | POA: Diagnosis present

## 2022-04-01 DIAGNOSIS — S22070A Wedge compression fracture of T9-T10 vertebra, initial encounter for closed fracture: Secondary | ICD-10-CM | POA: Diagnosis present

## 2022-04-01 DIAGNOSIS — K5900 Constipation, unspecified: Secondary | ICD-10-CM | POA: Clinically undetermined

## 2022-04-01 DIAGNOSIS — Z8719 Personal history of other diseases of the digestive system: Secondary | ICD-10-CM

## 2022-04-01 DIAGNOSIS — I272 Pulmonary hypertension, unspecified: Secondary | ICD-10-CM | POA: Diagnosis present

## 2022-04-01 DIAGNOSIS — J470 Bronchiectasis with acute lower respiratory infection: Secondary | ICD-10-CM | POA: Diagnosis present

## 2022-04-01 DIAGNOSIS — Z66 Do not resuscitate: Secondary | ICD-10-CM | POA: Diagnosis present

## 2022-04-01 DIAGNOSIS — K5981 Ogilvie syndrome: Secondary | ICD-10-CM | POA: Diagnosis present

## 2022-04-01 DIAGNOSIS — J189 Pneumonia, unspecified organism: Secondary | ICD-10-CM | POA: Diagnosis present

## 2022-04-01 DIAGNOSIS — J9622 Acute and chronic respiratory failure with hypercapnia: Secondary | ICD-10-CM | POA: Diagnosis present

## 2022-04-01 DIAGNOSIS — I4892 Unspecified atrial flutter: Secondary | ICD-10-CM | POA: Diagnosis present

## 2022-04-01 DIAGNOSIS — Z880 Allergy status to penicillin: Secondary | ICD-10-CM

## 2022-04-01 DIAGNOSIS — I4891 Unspecified atrial fibrillation: Secondary | ICD-10-CM | POA: Diagnosis not present

## 2022-04-01 DIAGNOSIS — B370 Candidal stomatitis: Secondary | ICD-10-CM

## 2022-04-01 DIAGNOSIS — J9621 Acute and chronic respiratory failure with hypoxia: Secondary | ICD-10-CM | POA: Diagnosis present

## 2022-04-01 LAB — BASIC METABOLIC PANEL
Anion gap: 11 (ref 5–15)
BUN: 17 mg/dL (ref 8–23)
CO2: 35 mmol/L — ABNORMAL HIGH (ref 22–32)
Calcium: 9.4 mg/dL (ref 8.9–10.3)
Chloride: 92 mmol/L — ABNORMAL LOW (ref 98–111)
Creatinine, Ser: 0.67 mg/dL (ref 0.44–1.00)
GFR, Estimated: >60 mL/min (ref >60)
Glucose, Bld: 138 mg/dL — ABNORMAL HIGH (ref 70–99)
Potassium: 4.3 mmol/L (ref 3.5–5.1)
Sodium: 138 mmol/L (ref 135–145)

## 2022-04-01 LAB — LACTIC ACID, PLASMA
Lactic Acid, Venous: 1.1 mmol/L (ref 0.5–1.9)
Lactic Acid, Venous: 1.3 mmol/L (ref 0.5–1.9)

## 2022-04-01 LAB — CBC
HCT: 40.2 % (ref 36.0–46.0)
Hemoglobin: 12.2 g/dL (ref 12.0–15.0)
MCH: 27.2 pg (ref 26.0–34.0)
MCHC: 30.3 g/dL (ref 30.0–36.0)
MCV: 89.5 fL (ref 80.0–100.0)
Platelets: 350 10*3/uL (ref 150–400)
RBC: 4.49 MIL/uL (ref 3.87–5.11)
RDW: 15.6 % — ABNORMAL HIGH (ref 11.5–15.5)
WBC: 16.8 10*3/uL — ABNORMAL HIGH (ref 4.0–10.5)
nRBC: 0 % (ref 0.0–0.2)

## 2022-04-01 LAB — BLOOD GAS, VENOUS
Acid-Base Excess: 12 mmol/L — ABNORMAL HIGH (ref 0.0–2.0)
Bicarbonate: 39.3 mmol/L — ABNORMAL HIGH (ref 20.0–28.0)
O2 Saturation: 80.6 %
Patient temperature: 37
pCO2, Ven: 62 mmHg — ABNORMAL HIGH (ref 44–60)
pH, Ven: 7.41 (ref 7.25–7.43)
pO2, Ven: 47 mmHg — ABNORMAL HIGH (ref 32–45)

## 2022-04-01 LAB — TROPONIN I (HIGH SENSITIVITY): Troponin I (High Sensitivity): 11 ng/L (ref <18)

## 2022-04-01 LAB — RESP PANEL BY RT-PCR (FLU A&B, COVID) ARPGX2
Influenza A by PCR: NEGATIVE
Influenza B by PCR: NEGATIVE
SARS Coronavirus 2 by RT PCR: NEGATIVE

## 2022-04-01 MED ORDER — IPRATROPIUM-ALBUTEROL 0.5-2.5 (3) MG/3ML IN SOLN
3.0000 mL | Freq: Once | RESPIRATORY_TRACT | Status: AC
  Administered 2022-04-01: 3 mL via RESPIRATORY_TRACT
  Filled 2022-04-01: qty 3

## 2022-04-01 MED ORDER — LACTATED RINGERS IV SOLN: INTRAVENOUS | Status: DC

## 2022-04-01 MED ORDER — DILTIAZEM LOAD VIA INFUSION
15.0000 mg | Freq: Once | INTRAVENOUS | Status: AC
  Administered 2022-04-01: 15 mg via INTRAVENOUS
  Filled 2022-04-01: qty 15

## 2022-04-01 MED ORDER — LEVOFLOXACIN IN D5W 750 MG/150ML IV SOLN
750.0000 mg | Freq: Once | INTRAVENOUS | Status: AC
  Administered 2022-04-01: 750 mg via INTRAVENOUS
  Filled 2022-04-01: qty 150

## 2022-04-01 MED ORDER — DILTIAZEM HCL-DEXTROSE 125-5 MG/125ML-% IV SOLN (PREMIX)
5.0000 mg/h | INTRAVENOUS | Status: DC
  Administered 2022-04-01 – 2022-04-02 (×2): 5 mg/h via INTRAVENOUS
  Filled 2022-04-01 (×2): qty 125

## 2022-04-01 MED ORDER — METHYLPREDNISOLONE SODIUM SUCC 125 MG IJ SOLR
90.0000 mg | Freq: Once | INTRAMUSCULAR | Status: AC
  Administered 2022-04-01: 90 mg via INTRAVENOUS
  Filled 2022-04-01: qty 2

## 2022-04-01 MED ORDER — SODIUM CHLORIDE 0.9 % IV BOLUS (SEPSIS)
1000.0000 mL | Freq: Once | INTRAVENOUS | Status: AC
  Administered 2022-04-01: 1000 mL via INTRAVENOUS

## 2022-04-01 NOTE — Sepsis Progress Note (Signed)
Elink following for Sepsis Protocol 

## 2022-04-01 NOTE — ED Provider Notes (Addendum)
? ?[facility] ?Provider Note ? ? ? Event Date/Time  ? First MD Initiated Contact with Patient 04/01/22 2117   ?  (approximate) ? ? ?History  ? ?Shortness of Breath ? ? ?HPI ? ?Jackie Turner is a 86 y.o. female   with a history of COPD as well as A-fib on 2 L nasal cannula at home presents to the ER for worsening shortness of breath.  Reportedly has been having productive cough for the past several days progressively worsening.  Has not been on any recent antibiotics.  Was found by her neighbor who works as a Marine scientist here was found to be 85% on 4 L nasal cannula at home.  Patient denies any abdominal pain no nausea or vomiting no lower extremity swelling.  States that she been compliant with her medications ? ?  ? ? ?Physical Exam  ? ?Triage Vital Signs: ?ED Triage Vitals  ?Enc Vitals Group  ?   BP 04/01/22 2057 127/85  ?   Pulse Rate 04/01/22 2057 (!) 129  ?   Resp 04/01/22 2057 (!) 28  ?   Temp 04/01/22 2157 (S) (!) 101 ?F (38.3 ?C)  ?   Temp Source 04/01/22 2157 (S) Oral  ?   SpO2 04/01/22 2057 94 %  ?   Weight 04/01/22 2100 116 lb 3.2 oz (52.7 kg)  ?   Height 04/01/22 2100 4' 9.5" (1.461 m)  ?   Head Circumference --   ?   Peak Flow --   ?   Pain Score 04/01/22 2100 0  ?   Pain Loc --   ?   Pain Edu? --   ?   Excl. in [place]? --   ? ? ?Most recent vital signs: ?Vitals:  ? 04/01/22 2315 04/01/22 2340  ?BP:  118/69  ?Pulse: (!) 135 (!) 137  ?Resp: (!) 24 (!) 35  ?Temp:    ?SpO2: 95% 96%  ? ? ? ?Constitutional: Alert but ill-appearing.  Protecting her airway ?Eyes: Conjunctivae are normal.  ?Head: Atraumatic. ?Nose: No congestion/rhinnorhea. ?Mouth/Throat: Mucous membranes are moist.   ?Neck: Painless ROM.  ?Cardiovascular:   Good peripheral circulation.  Tachycardic irregularly irregular ?Respiratory: Mild tachypnea with some rhonchi in the right base with scattered wheeze throughout. ?Gastrointestinal: Soft and nontender.  ?Musculoskeletal:  no deformity ?Neurologic:  MAE spontaneously. No  gross focal neurologic deficits are appreciated.  ?Skin:  Skin is warm, dry and intact. No rash noted. ?Psychiatric: Mood and affect are normal. Speech and behavior are normal. ? ? ? ?ED Results / Procedures / Treatments  ? ?Labs ?(all labs ordered are listed, but only abnormal results are displayed) ?Labs Reviewed  ?BASIC METABOLIC PANEL - Abnormal; Notable for the following components:  ?    Result Value  ? Chloride 92 (*)   ? CO2 35 (*)   ? Glucose, Bld 138 (*)   ? All other components within normal limits  ?CBC - Abnormal; Notable for the following components:  ? WBC 16.8 (*)   ? RDW 15.6 (*)   ? All other components within normal limits  ?BLOOD GAS, VENOUS - Abnormal; Notable for the following components:  ? pCO2, Ven 62 (*)   ? pO2, Ven 47 (*)   ? Bicarbonate 39.3 (*)   ? Acid-Base Excess 12.0 (*)   ? All other components within normal limits  ?RESP PANEL BY RT-PCR (FLU A&B, COVID) [id number]  ?CULTURE, BLOOD (ROUTINE X 2)  ?CULTURE, BLOOD (ROUTINE X 2)  ?  LACTIC ACID, PLASMA  ?LACTIC ACID, PLASMA  ?TROPONIN I (HIGH SENSITIVITY)  ?TROPONIN I (HIGH SENSITIVITY)  ? ? ? ?EKG ? ?ED ECG REPORT ?I, Merlyn Lot, the attending physician, personally viewed and interpreted this ECG. ? ? Date: 04/01/2022 ? EKG Time: 20:57 ? Rate: 140 ? Rhythm: variably conducted a flutter vs coarse afib with rvr ? Axis: 140 ? Intervals: normal qt ? ST&T Change: nonspecific st abn, no depression ? ? ? ?RADIOLOGY ?Please see ED Course for my review and interpretation. ? ?I personally reviewed all radiographic images ordered to evaluate for the above acute complaints and reviewed radiology reports and findings.  These findings were personally discussed with the patient.  Please see medical record for radiology report. ? ? ? ?PROCEDURES: ? ?Critical Care performed:  ? ?.Critical Care ?Performed by: Merlyn Lot, MD ?Authorized by: Merlyn Lot, MD  ? ?Critical care provider statement:  ?  Critical care time (minutes):  34 ?   Critical care was necessary to treat or prevent imminent or life-threatening deterioration of the following conditions:  Circulatory failure, sepsis and respiratory failure ?  Critical care was time spent personally by me on the following activities:  Ordering and performing treatments and interventions, ordering and review of laboratory studies, ordering and review of radiographic studies, pulse oximetry, re-evaluation of patient's condition, review of old charts, obtaining history from patient or surrogate, examination of patient, evaluation of patient's response to treatment, discussions with primary provider, discussions with consultants and development of treatment plan with patient or surrogate ? ? ?MEDICATIONS ORDERED IN ED: ?Medications  ?lactated ringers infusion ( Intravenous New Bag/Given 04/01/22 2229)  ?levofloxacin (LEVAQUIN) IVPB 750 mg (750 mg Intravenous New Bag/Given 04/01/22 2234)  ?diltiazem (CARDIZEM) 1 mg/mL load via infusion 15 mg (15 mg Intravenous Bolus from Bag 04/01/22 2341)  ?  And  ?diltiazem (CARDIZEM) 125 mg in dextrose 5% 125 mL (1 mg/mL) infusion (5 mg/hr Intravenous New Bag/Given 04/01/22 2343)  ?methylPREDNISolone sodium succinate (SOLU-MEDROL) 125 mg/2 mL injection 90 mg (has no administration in time range)  ?sodium chloride 0.9 % bolus 1,000 mL (0 mLs Intravenous Stopped 04/01/22 2341)  ?ipratropium-albuterol (DUONEB) 0.5-2.5 (3) MG/3ML nebulizer solution 3 mL (3 mLs Nebulization Given 04/01/22 2246)  ?ipratropium-albuterol (DUONEB) 0.5-2.5 (3) MG/3ML nebulizer solution 3 mL (3 mLs Nebulization Given 04/01/22 2246)  ? ? ? ?IMPRESSION / MDM / ASSESSMENT AND PLAN / ED COURSE  ?I reviewed the triage vital signs and the nursing notes. ?             ?               ? ?Differential diagnosis includes, but is not limited to, sepsis, anemia, CHF, COPD, A-fib, dysrhythmia, pneumonia, aspiration ? ?Patient presenting with symptoms as described above.  Patient febrile tachycardic tachypneic with  acute respiratory failure with hypoxia.  Septic with work-up initiated.  On review of her EKG does appear that she is in A-fib flutter versus coarse A-fib with RVR.  Will give IV fluids as this may be reactive in the setting of severe sepsis.  She is requiring supplemental oxygen she is currently protecting her airway.  Does have history of COPD will give nebulizers that she states that she ran out.  Will give dose of IV Solu-Medrol. ? ? ?Clinical Course as of 04/01/22 2344  ?Mon Apr 01, 2022  ?2259 Patient persistent A-fib flutter.  Receiving IV antibiotics for sepsis and pneumonia.  Given her flutter with RVR will put on IV  Cardizem.  Most recent echo did not show significant diastolic dysfunction.  She is already on verapamil.  Giving IV fluids but her lactate is normal. [PR]  ?2341 Case discussed in consultation with hospitalist who agrees admit patient to their service. [PR]  ?  ?Clinical Course User Index ?[PR] Merlyn Lot, MD  ? ? ? ?FINAL CLINICAL IMPRESSION(S) / ED DIAGNOSES  ? ?Final diagnoses:  ?Sepsis with acute hypoxic respiratory failure without septic shock, due to unspecified organism [facility])  ?Atrial fibrillation with rapid ventricular response ([place])  ? ? ? ?Rx / DC Orders  ? ?ED Discharge Orders   ? ? None  ? ?  ? ? ? ?Note:  This document was prepared using Dragon voice recognition software and may include unintentional dictation errors. ? ?  ?Merlyn Lot, MD ?04/01/22 2344 ? ?  ?Merlyn Lot, MD ?04/01/22 2344 ? ?

## 2022-04-01 NOTE — ED Triage Notes (Signed)
Pt to ED via POV with her neighbor, pt with noted SOB that started earlier today, pt reports is normally on 2L via [place], neighbor reports 85% on 4L via [place] at home, pt currently on 5L via [place], pt with noted difficulty speaking in complete sentences and tachypnea on assessment.  ?

## 2022-04-01 NOTE — Progress Notes (Signed)
CODE SEPSIS - PHARMACY COMMUNICATION ? ?**Broad Spectrum Antibiotics should be administered within 1 hour of Sepsis diagnosis** ? ?Time Code Sepsis Called/Page Received: 2207 ? ?Antibiotics Ordered: Levaquin ? ?Time of 1st antibiotic administration: 2234 ? ?Renda Rolls, PharmD, MBA ?04/01/2022 ?10:07 PM ? ?

## 2022-04-02 DIAGNOSIS — Z888 Allergy status to other drugs, medicaments and biological substances status: Secondary | ICD-10-CM | POA: Diagnosis not present

## 2022-04-02 DIAGNOSIS — J189 Pneumonia, unspecified organism: Secondary | ICD-10-CM | POA: Diagnosis present

## 2022-04-02 DIAGNOSIS — Z7189 Other specified counseling: Secondary | ICD-10-CM | POA: Diagnosis not present

## 2022-04-02 DIAGNOSIS — Z886 Allergy status to analgesic agent status: Secondary | ICD-10-CM | POA: Diagnosis not present

## 2022-04-02 DIAGNOSIS — K5981 Ogilvie syndrome: Secondary | ICD-10-CM | POA: Diagnosis present

## 2022-04-02 DIAGNOSIS — Z515 Encounter for palliative care: Secondary | ICD-10-CM | POA: Diagnosis not present

## 2022-04-02 DIAGNOSIS — I4892 Unspecified atrial flutter: Secondary | ICD-10-CM | POA: Diagnosis present

## 2022-04-02 DIAGNOSIS — Z880 Allergy status to penicillin: Secondary | ICD-10-CM | POA: Diagnosis not present

## 2022-04-02 DIAGNOSIS — R140 Abdominal distension (gaseous): Secondary | ICD-10-CM | POA: Diagnosis not present

## 2022-04-02 DIAGNOSIS — Z66 Do not resuscitate: Secondary | ICD-10-CM | POA: Diagnosis present

## 2022-04-02 DIAGNOSIS — E875 Hyperkalemia: Secondary | ICD-10-CM | POA: Diagnosis present

## 2022-04-02 DIAGNOSIS — J9601 Acute respiratory failure with hypoxia: Secondary | ICD-10-CM | POA: Diagnosis not present

## 2022-04-02 DIAGNOSIS — M4854XA Collapsed vertebra, not elsewhere classified, thoracic region, initial encounter for fracture: Secondary | ICD-10-CM | POA: Diagnosis present

## 2022-04-02 DIAGNOSIS — Z881 Allergy status to other antibiotic agents status: Secondary | ICD-10-CM | POA: Diagnosis not present

## 2022-04-02 DIAGNOSIS — I4819 Other persistent atrial fibrillation: Secondary | ICD-10-CM | POA: Diagnosis present

## 2022-04-02 DIAGNOSIS — J9622 Acute and chronic respiratory failure with hypercapnia: Secondary | ICD-10-CM | POA: Diagnosis present

## 2022-04-02 DIAGNOSIS — K5900 Constipation, unspecified: Secondary | ICD-10-CM | POA: Diagnosis not present

## 2022-04-02 DIAGNOSIS — Z20822 Contact with and (suspected) exposure to covid-19: Secondary | ICD-10-CM | POA: Diagnosis present

## 2022-04-02 DIAGNOSIS — R1084 Generalized abdominal pain: Secondary | ICD-10-CM | POA: Diagnosis not present

## 2022-04-02 DIAGNOSIS — K50012 Crohn's disease of small intestine with intestinal obstruction: Secondary | ICD-10-CM | POA: Diagnosis present

## 2022-04-02 DIAGNOSIS — Z9981 Dependence on supplemental oxygen: Secondary | ICD-10-CM | POA: Diagnosis not present

## 2022-04-02 DIAGNOSIS — K2990 Gastroduodenitis, unspecified, without bleeding: Secondary | ICD-10-CM | POA: Diagnosis present

## 2022-04-02 DIAGNOSIS — A419 Sepsis, unspecified organism: Secondary | ICD-10-CM | POA: Diagnosis present

## 2022-04-02 DIAGNOSIS — K50019 Crohn's disease of small intestine with unspecified complications: Secondary | ICD-10-CM | POA: Diagnosis not present

## 2022-04-02 DIAGNOSIS — D509 Iron deficiency anemia, unspecified: Secondary | ICD-10-CM | POA: Diagnosis present

## 2022-04-02 DIAGNOSIS — J9621 Acute and chronic respiratory failure with hypoxia: Secondary | ICD-10-CM | POA: Diagnosis present

## 2022-04-02 DIAGNOSIS — J470 Bronchiectasis with acute lower respiratory infection: Secondary | ICD-10-CM | POA: Diagnosis present

## 2022-04-02 DIAGNOSIS — I4891 Unspecified atrial fibrillation: Secondary | ICD-10-CM | POA: Diagnosis present

## 2022-04-02 DIAGNOSIS — R911 Solitary pulmonary nodule: Secondary | ICD-10-CM | POA: Diagnosis present

## 2022-04-02 DIAGNOSIS — I272 Pulmonary hypertension, unspecified: Secondary | ICD-10-CM | POA: Diagnosis present

## 2022-04-02 DIAGNOSIS — Z885 Allergy status to narcotic agent status: Secondary | ICD-10-CM | POA: Diagnosis not present

## 2022-04-02 DIAGNOSIS — K567 Ileus, unspecified: Secondary | ICD-10-CM | POA: Diagnosis present

## 2022-04-02 LAB — RESPIRATORY PANEL BY PCR

## 2022-04-02 LAB — TROPONIN I (HIGH SENSITIVITY): Troponin I (High Sensitivity): 10 ng/L (ref <18)

## 2022-04-02 LAB — HIV ANTIBODY (ROUTINE TESTING W REFLEX): HIV Screen 4th Generation wRfx: NONREACTIVE

## 2022-04-02 MED ORDER — ALBUTEROL SULFATE (2.5 MG/3ML) 0.083% IN NEBU
2.5000 mg | INHALATION_SOLUTION | RESPIRATORY_TRACT | Status: DC | PRN
  Administered 2022-04-09: 2.5 mg via RESPIRATORY_TRACT
  Filled 2022-04-02: qty 3

## 2022-04-02 MED ORDER — PREDNISONE 20 MG PO TABS
40.0000 mg | ORAL_TABLET | Freq: Every day | ORAL | Status: AC
  Administered 2022-04-03 – 2022-04-06 (×4): 40 mg via ORAL
  Filled 2022-04-02 (×4): qty 2

## 2022-04-02 MED ORDER — IPRATROPIUM-ALBUTEROL 0.5-2.5 (3) MG/3ML IN SOLN
3.0000 mL | Freq: Four times a day (QID) | RESPIRATORY_TRACT | Status: DC
  Administered 2022-04-02 – 2022-04-03 (×7): 3 mL via RESPIRATORY_TRACT
  Filled 2022-04-02 (×7): qty 3

## 2022-04-02 MED ORDER — ACETAMINOPHEN 325 MG PO TABS
650.0000 mg | ORAL_TABLET | ORAL | Status: DC | PRN
  Administered 2022-04-02 – 2022-04-12 (×13): 650 mg via ORAL
  Filled 2022-04-02 (×14): qty 2

## 2022-04-02 MED ORDER — METHYLPREDNISOLONE SODIUM SUCC 40 MG IJ SOLR
40.0000 mg | Freq: Two times a day (BID) | INTRAMUSCULAR | Status: AC
  Administered 2022-04-02 (×2): 40 mg via INTRAVENOUS
  Filled 2022-04-02 (×2): qty 1

## 2022-04-02 MED ORDER — ALPRAZOLAM 0.5 MG PO TABS
0.2500 mg | ORAL_TABLET | Freq: Once | ORAL | Status: AC
  Administered 2022-04-02: 0.25 mg via ORAL
  Filled 2022-04-02: qty 1

## 2022-04-02 MED ORDER — LEVOFLOXACIN IN D5W 750 MG/150ML IV SOLN
750.0000 mg | INTRAVENOUS | Status: AC
  Administered 2022-04-03 – 2022-04-05 (×2): 750 mg via INTRAVENOUS
  Filled 2022-04-02 (×2): qty 150

## 2022-04-02 MED ORDER — ONDANSETRON HCL 4 MG/2ML IJ SOLN
4.0000 mg | Freq: Four times a day (QID) | INTRAMUSCULAR | Status: DC | PRN
  Administered 2022-04-03 – 2022-04-07 (×6): 4 mg via INTRAVENOUS
  Filled 2022-04-02 (×6): qty 2

## 2022-04-02 MED ORDER — DOXEPIN HCL 10 MG PO CAPS
30.0000 mg | ORAL_CAPSULE | Freq: Every day | ORAL | Status: DC
  Administered 2022-04-02 – 2022-04-11 (×10): 30 mg via ORAL
  Filled 2022-04-02 (×11): qty 3

## 2022-04-02 MED ORDER — LACTATED RINGERS IV SOLN: INTRAVENOUS | Status: DC

## 2022-04-02 MED ORDER — VERAPAMIL HCL ER 120 MG PO TBCR
120.0000 mg | EXTENDED_RELEASE_TABLET | Freq: Every day | ORAL | Status: DC
  Administered 2022-04-02 – 2022-04-03 (×2): 120 mg via ORAL
  Filled 2022-04-02 (×3): qty 1

## 2022-04-02 MED ORDER — ENOXAPARIN SODIUM 40 MG/0.4ML IJ SOSY
40.0000 mg | PREFILLED_SYRINGE | INTRAMUSCULAR | Status: DC
  Administered 2022-04-02 – 2022-04-11 (×10): 40 mg via SUBCUTANEOUS
  Filled 2022-04-02 (×11): qty 0.4

## 2022-04-02 NOTE — ED Notes (Signed)
Pt fully changed, brief changed, fresh chuck, post void. Pt sitting up and eating lunch at this time.  ?

## 2022-04-02 NOTE — Assessment & Plan Note (Addendum)
Febrile, tachycardic and tachypneic with leukocytosis.  Lactic acid normal ?Sepsis fluids and treat pneumonia as outlined.  Sepsis physiology improved, tachycardia and persistent due to A-fib RVR. ?

## 2022-04-02 NOTE — ED Notes (Signed)
Pt's granddaughter updated at bedside, pt's granddaughter states pt is typically lethargic and fatigued in the morning and unable to stay awake, granddaughter states pt typically remains that way until around 1300 d/t not sleeping at night. MD Marcille Blanco with this update at this time.  ?

## 2022-04-02 NOTE — Assessment & Plan Note (Addendum)
Completed course of IV Levaquin.  ?Incentive spirometry. ?DuoNebs as needed and antitussives. ?Supplement O2 per protocol. ?

## 2022-04-02 NOTE — H&P (Signed)
?History and Physical  ? ? ?Patient: Mackie Goon ZOX:[id number] DOB: 09-17-1936 ?DOA: 04/01/2022 ?DOS: the patient was seen and examined on 04/02/2022 ?PCP: Virginia Crews, MD  ?Patient coming from: Home ? ?Chief Complaint:  ?Chief Complaint  ?Patient presents with  ? Shortness of Breath  ? ? ?HPI: Naamah Boggess is a 86 y.o. female with medical history significant for Bronchiectasis, pulmonary hypertension, chronic hypoxic and hypercapnic respiratory failure on home O2 at 2 L, atrial fibrillation, no longer on Eliquis due to GI bleed and chronic iron deficiency anemia, who presents to the ED with shortness of breath and reports O2 sat of 85% on 4 L at home and speaking in short sentences.  States she has been having a productive cough for the past several days that has been worsening.  Denies chest pain ?ED course and data review: On arrival in rapid A-fib at 129, tachypneic to 29 with temperature 101.  O2 sat 94% on 4 L.  Blood work significant for leukocytosis of 16,000 with lactic acid 1.3.  VBG with pH 7.4, PCO2 62.  COVID and flu negative.  Chest x-ray shows possible developing infiltrate right lung base.  Patient treated with Levaquin, DuoNeb and Solu-Medrol.  Also started on a Cardizem infusion.  Hospitalist consulted for admission.  ? ?Review of Systems: As mentioned in the history of present illness. All other systems reviewed and are negative. ?Past Medical History:  ?Diagnosis Date  ? Allergy   ? Asthma   ? Hyperlipidemia   ? Osteoporosis   ? ?Past Surgical History:  ?Procedure Laterality Date  ? ABDOMINAL HYSTERECTOMY  2003  ? [place]  ? benign  ? carpal tunnel repair Bilateral   ? R 1990; L 1997  ? COLONOSCOPY WITH PROPOFOL N/A 08/14/2020  ? Procedure: COLONOSCOPY WITH PROPOFOL;  Surgeon: Lucilla Lame, MD;  Location: [facility] ENDOSCOPY;  Service: Endoscopy;  Laterality: N/A;  ? ESOPHAGOGASTRODUODENOSCOPY N/A 08/12/2020  ? Procedure: ESOPHAGOGASTRODUODENOSCOPY (EGD);   Surgeon: Lin Landsman, MD;  Location: [facility] ENDOSCOPY;  Service: Gastroenterology;  Laterality: N/A;  ? EYE SURGERY Bilateral   ? cataract extraction  ? HAND TENDON SURGERY Right   ? Trigger finger  ? IR RADIOLOGIST EVAL & MGMT  06/13/2017  ? IR VERTEBROPLASTY CERV/THOR BX INC UNI/BIL INC/INJECT/IMAGING  06/17/2017  ? TONSILLECTOMY  1961  ? ?Social History:  reports that she has never smoked. She has never used smokeless tobacco. She reports that she does not drink alcohol and does not use drugs. ? ?Allergies  ?Allergen Reactions  ? Nsaids Other (See Comments)  ?  History of gastric ulcer  ? Amoxicillin Nausea Only and Rash  ? Ibandronic Acid Other (See Comments)  ?  Muscle weakness - advised not to take ? ?**BONIVA** - drug name  ? Actonel  [Risedronate] Other (See Comments)  ?  Gastric ulcers  ? Antihistamines, Chlorpheniramine-Type   ? Aspirin   ?  Gastric ulcer  ? Buspar [Buspirone] Other (See Comments)  ?  Pt does not remember reaction   ? Butalbital-Aspirin-Caffeine Other (See Comments)  ?  Other reaction(s): Unknown  ? Clarithromycin Other (See Comments)  ?  Bloating ? ?Binnie Rail*  ? Codeine   ? Gatifloxacin Other (See Comments)  ? Hydrocodone-Guaifenesin Nausea Only  ?  CODICLEAR [place]   ? Moxifloxacin   ? Oxycodone Other (See Comments)  ?  Dizziness  ? Penicillins Other (See Comments)  ? Risedronate Sodium   ?  Gastric ulcer  ?  Tylenol With Codeine #3  [Acetaminophen-Codeine]   ?  Other reaction(s): Unknown  ? Azithromycin Other (See Comments)  ?  Pt states med causes severe abd pain  ? Raloxifene Other (See Comments)  ?  Bloating ?Bloating ? ?*EVISTA*  ? ? ?Family History  ?Problem Relation Age of Onset  ? Heart disease Mother   ? CAD Mother   ? Congestive Heart Failure Mother   ? Osteoarthritis Mother   ? Cancer Sister   ?     breast  ? Breast cancer Sister   ? ? ?Prior to Admission medications   ?Medication Sig Start Date End Date Taking? Authorizing Provider  ?albuterol (PROVENTIL) (2.5 MG/3ML)  0.083% nebulizer solution USE 3 ML BY NEBULIZATION EVERY 6 HOURS AS NEEDED FOR WHEEZING OR SHORTNESS OF BREATH 02/07/22   Tyler Pita, MD  ?Calcium Carb-Cholecalciferol 600-400 MG-UNIT TABS Take by mouth.    [provider]  ?cetirizine (ZYRTEC) 10 MG tablet Take '5mg'$  daily for 1-2 weeks. Then increase to '10mg'$  daily if tolerating well. 01/23/22   Mecum, Erin E, PA-C  ?dicyclomine (BENTYL) 10 MG capsule TAKE 1 CAPSULE BY MOUTH 3 TIMES DAILY ASNEEDED FOR SPASMS 02/07/22   Bacigalupo, Dionne Bucy, MD  ?doxepin (SINEQUAN) 10 MG capsule TAKE 3 CAPSULES BY MOUTH AT BEDTIME 01/18/22   Virginia Crews, MD  ?feeding supplement, ENSURE ENLIVE, (ENSURE ENLIVE) LIQD Take 237 mLs by mouth 2 (two) times daily between meals. 06/16/20   Hongalgi, Lenis Dickinson, MD  ?fluticasone (FLONASE) 50 MCG/ACT nasal spray Place 2 sprays into both nostrils daily. 05/31/21   Jerrol Banana., MD  ?furosemide (LASIX) 40 MG tablet Take 1 tablet (40 mg total) by mouth daily as needed for edema. 10/30/20   Mar Daring, PA-C  ?melatonin 3 MG TABS tablet Take 1 tablet (3 mg total) by mouth at bedtime as needed. 11/19/21   Virginia Crews, MD  ?nystatin (MYCOSTATIN) 100000 UNIT/ML suspension TAKE 5 MLS BY MOUTH 4 TIMES A DAY. 10/30/20   Mar Daring, PA-C  ?oxybutynin (DITROPAN XL) 15 MG 24 hr tablet Take 1 tablet (15 mg total) by mouth at bedtime. 06/22/20   Mar Daring, PA-C  ?OXYGEN Inhale 2 L into the lungs daily.    [provider]  ?pentosan polysulfate (ELMIRON) 100 MG capsule Take 1 capsule (100 mg total) by mouth 2 (two) times daily as needed (urinary pain). 10/30/20   Mar Daring, PA-C  ?traMADol (ULTRAM) 50 MG tablet Take 1 tablet (50 mg total) by mouth every 8 (eight) hours as needed. for pain 09/28/21   Birdie Sons, MD  ?Donnal Debar 100-62.5-25 MCG/INH AEPB INHALE 1 PUFF INTO THE LUNGS DAILY 07/13/21   Virginia Crews, MD  ?VENTOLIN HFA 108 (90 Base) MCG/ACT inhaler Inhale  2 puffs into the lungs every 4 (four) hours as needed for wheezing or shortness of breath. 08/23/21   Virginia Crews, MD  ?verapamil (CALAN-SR) 120 MG CR tablet TAKE 1 TABLET BY MOUTH AT BEDTIME 03/01/22   Bacigalupo, Dionne Bucy, MD  ? ? ?Physical Exam: ?Vitals:  ? 04/01/22 2340 04/01/22 2347 04/02/22 0000 04/02/22 0015  ?BP: 118/69 (!) 108/58 91/63 111/74  ?Pulse: (!) 137 (!) 123 (!) 127 (!) 130  ?Resp: (!) 35 (!) 31 19 (!) 32  ?Temp:    (!) 101.7 ?F (38.7 ?C)  ?TempSrc:    Oral  ?SpO2: 96% 97% 96% 94%  ?Weight:      ?Height:      ? ?  Physical Exam ?Vitals and nursing note reviewed.  ?Constitutional:   ?   General: She is not in acute distress. ?HENT:  ?   Head: Normocephalic and atraumatic.  ?Cardiovascular:  ?   Rate and Rhythm: Regular rhythm. Tachycardia present.  ?   Pulses: Normal pulses.  ?   Heart sounds: Normal heart sounds.  ?Pulmonary:  ?   Effort: Pulmonary effort is normal. Tachypnea present.  ?   Breath sounds: Normal breath sounds.  ?Abdominal:  ?   Palpations: Abdomen is soft.  ?   Tenderness: There is no abdominal tenderness.  ?Neurological:  ?   Mental Status: Mental status is at baseline.  ? ? ? ?Data Reviewed: ?Relevant notes from primary care and specialist visits, past discharge summaries as available in EHR, including Care Everywhere. ?Prior diagnostic testing as pertinent to current admission diagnoses ?Updated medications and problem lists for reconciliation ?ED course, including vitals, labs, imaging, treatment and response to treatment ?Triage notes, nursing and pharmacy notes and ED provider's notes ?Notable results as noted in HPI ? ? ?Assessment and Plan: ?* Sepsis ([place]) ?Febrile, tachycardic and tachypneic with leukocytosis.  Lactic acid normal ?Sepsis fluids and treat pneumonia as outlined ? ?Right lower lobe pneumonia ?IV Levaquin given multiple antibiotic allergies ?Incentive spirometry as tolerated ?DuoNebs as needed and antitussives ? ?Acute respiratory failure with hypoxia and  hypercapnia (HCC) ?Possibly multifactorial related to pneumonia, COPD, bronchiectasis ?Patient requiring 4 L above her baseline of 2 secondary to pneumonia ?Continue supplemental O2 to keep sats over 92% ? ?

## 2022-04-02 NOTE — Assessment & Plan Note (Addendum)
RVR being driven by acute illness, initially PNA and respiratory symptoms, now persistent in setting of GI issues. ?--started on amio bolus f/b gtt on 4/23, d/c'ed on 4/26 ? Appeared rate controlled now. ?Plan: ?--cont cardizem 360 mg daily ?

## 2022-04-02 NOTE — ED Notes (Signed)
This RN confirmed with lab at this time 20 pathogens PCR panel could be ran on covid PCR swab currently in lab ? ?

## 2022-04-02 NOTE — ED Notes (Signed)
Pt ambulated without assistance to [facility] and back into bed. Hooked back up to monitoring equipment. Call bell within reach, side rails up, remote in hand.  ?

## 2022-04-02 NOTE — ED Notes (Signed)
This RN noted this pt's O2 saturations were low, this RN went into this pt's room to see Jackie Turner off of pt. Jackie Turner reapplied and O2 sats increased to 90's ?

## 2022-04-02 NOTE — ED Notes (Signed)
Pt lethargic and unable to stay awake for full duoneb tx, this RN had to hold apparatus up for this pt. MD Billie Ruddy notified of pt's lethargy and fatigue at this time.  ?

## 2022-04-02 NOTE — Assessment & Plan Note (Addendum)
Not acutely exacerbated ?DuoNebs as needed.  ?Cont formulary equivalents for Trelegy ?

## 2022-04-02 NOTE — Progress Notes (Signed)
PT Cancellation Note ? ?Patient Details ?Name: Jackie Turner ?MRN: [id number] ?DOB: 12/08/1936 ? ? ?Cancelled Treatment:    Reason Eval/Treat Not Completed: Other (comment). PT to hold at this time due to pt lethargy, and low BP. PT to re-attempt as able.  ? ? ?Lieutenant Diego PT, DPT ?10:31 AM,04/02/22  ?

## 2022-04-02 NOTE — Assessment & Plan Note (Addendum)
Possible bronchiectasis flare due to PNA. ?DuoNebs as Q6H and PRN. ?Transitioned IV Solu-medrol to Prednisone. ?Completed prednisone on 4/22. ?

## 2022-04-02 NOTE — Progress Notes (Signed)
PT Cancellation Note ? ?Patient Details ?Name: Jackie Turner ?MRN: [id number] ?DOB: 10-22-1936 ? ? ?Cancelled Treatment:    Reason Eval/Treat Not Completed: Other (comment). PT order received, chart reviewed. Spoke with nsg who indicated pt is independent in room, ambulating to/from [facility] without assist, and is functioning at or near baseline level of independence at this time. No skilled PT needs identified. Will sign off. Please re-consult if additional PT needs arise during this admission.  ?  ?Lieutenant Diego PT, DPT ?3:51 PM,04/02/22 ? ?

## 2022-04-02 NOTE — Progress Notes (Signed)
Jackie Turner is a 86 y.o. female with medical history significant for Bronchiectasis, COPD, chronic hypoxic and hypercapnic respiratory failure on home 2L O2, atrial fibrillation, no longer on Eliquis due to GI bleed and chronic iron deficiency anemia, who presented to the ED with shortness of breath and reports O2 sat of 85% on 4 L at home.  pt stated feeling generally unwell for a while. ? ?On arrival in rapid A-fib at 129, tachypneic to 29 with temperature 101.  O2 sat 94% on 4 L.  VBG with pH 7.4, PCO2 62, which is about baseline.  COVID and flu negative.  Chest x-ray showed possible developing infiltrate right lung base.  Patient started on Levaquin, DuoNeb and Solu-Medrol.  Also started on a Cardizem infusion.   ? ?Pt seen and examined.  Reported feeling better.   ?  ?Right lower lobe pneumonia ?--started on IV Levaquin given multiple antibiotic allergies, continue. ?  ?Acute respiratory failure with hypoxia and hypercapnia (HCC) ?Possibly multifactorial related to pneumonia, COPD, bronchiectasis ?Patient requiring 4 L above her baseline of 2 secondary to pneumonia ?Continue supplemental O2 to keep sats over 92% ?  ?Atrial fibrillation with RVR ([place]) ?--resume home verapamil ?--cont dilt gtt with taper per nursing ?  ?Bronchiectasis ([place]) ?Possible bronchiectasis flare  ?--started on IV solumedrol and scheduled DuoNeb, continue for now ?  ? ?No charge note. ?

## 2022-04-02 NOTE — ED Notes (Signed)
Pt ambulated to the [facility] and back without assistance. Pt able to stably get out of bed, ambulate, and get back into bed.  ?

## 2022-04-02 NOTE — Progress Notes (Signed)
Pharmacy Antibiotic Note ? ?[name] is a 86 y.o. female admitted on 04/01/2022 with CAP.  Pharmacy has been consulted for Levaquin dosing x 5 days. ? ?Plan: ?Although pt tolerated ceftriaxone Aug 2021 and med allergies/intolerance to macrolides are GI effects, since pt already given initial dose of Levaquin IV in ED, which would prevent start of Azithromycin for ~ 24 hr, will order Levaquin 750 mg IV q48h x 2 doses per indication & renal function.  Pharmacy will continue to follow and adjust abx dosing if warranted. ? ?Height: 4' 9.5" (146.1 cm) ?Weight: 52.7 kg (116 lb 3.2 oz) ?IBW/kg (Calculated) : 39.75 ? ?Temp (24hrs), Avg:101.4 ?F (38.6 ?C), Min:101 ?F (38.3 ?C), Max:101.7 ?F (38.7 ?C) ? ?Recent Labs  ?Lab 04/01/22 ?2127 04/01/22 ?2324  ?WBC 16.8*  --   ?CREATININE 0.67  --   ?LATICACIDVEN 1.1 1.3  ?  ?Estimated Creatinine Clearance: 37.2 mL/min (by C-G formula based on SCr of 0.67 mg/dL).   ? ?Allergies  ?Allergen Reactions  ? Nsaids Other (See Comments)  ?  History of gastric ulcer  ? Amoxicillin Nausea Only and Rash  ? Ibandronic Acid Other (See Comments)  ?  Muscle weakness - advised not to take ? ?**BONIVA** - drug name  ? Actonel  [Risedronate] Other (See Comments)  ?  Gastric ulcers  ? Antihistamines, Chlorpheniramine-Type   ? Aspirin   ?  Gastric ulcer  ? Buspar [Buspirone] Other (See Comments)  ?  Pt does not remember reaction   ? Butalbital-Aspirin-Caffeine Other (See Comments)  ?  Other reaction(s): Unknown  ? Clarithromycin Other (See Comments)  ?  Bloating ? ?Binnie Rail*  ? Codeine   ? Gatifloxacin Other (See Comments)  ? Hydrocodone-Guaifenesin Nausea Only  ?  CODICLEAR [place]   ? Moxifloxacin   ? Oxycodone Other (See Comments)  ?  Dizziness  ? Penicillins Other (See Comments)  ? Risedronate Sodium   ?  Gastric ulcer  ? Tylenol With Codeine #3  [Acetaminophen-Codeine]   ?  Other reaction(s): Unknown  ? Azithromycin Other (See Comments)  ?  Pt states med causes severe abd pain  ?  Raloxifene Other (See Comments)  ?  Bloating ?Bloating ? ?*EVISTA*  ? ? ?Antimicrobials this admission: ?4/17 Levaquin >> 4/23 ? ?Microbiology results: ?4/17 BCx: Pending ? ?Thank you for allowing pharmacy to be a part of this patient?s care. ? ?Renda Rolls, PharmD, MBA ?04/02/2022 ?12:46 AM ? ? ?

## 2022-04-02 NOTE — Progress Notes (Signed)
OT Screen Note ? ?Patient Details ?Name: Jackie Turner ?MRN: [id number] ?DOB: 10/04/1936 ? ? ?Cancelled Treatment:    Reason Eval/Treat Not Completed: OT screened, no needs identified, will sign off. OT order received, chart reviewed. Spoke with nsg who indicated pt is independent in room, ambulating to/from [facility] without assist, and is functioning at or near baseline level of independence at this time. No skilled OT needs identified. Will sign off. Please re-consult if additional OT needs arise during this admission.  ? ?Shara Blazing, M.S., OTR/L ?Feeding Team - [facility] ?Ascom: [phone] ?04/02/22, 2:55 PM ? ? ?

## 2022-04-02 NOTE — ED Notes (Signed)
Pt wide awake and eating at this time ?

## 2022-04-02 NOTE — Assessment & Plan Note (Addendum)
On 2L O2 at baseline. ?Patient requiring 4 L above her baseline of 2 secondary to pneumonia, COPD, bronchiectasis ?--now back on 2L baseline ?

## 2022-04-02 NOTE — ED Notes (Signed)
RN aware bed assigned ?

## 2022-04-03 DIAGNOSIS — I4891 Unspecified atrial fibrillation: Secondary | ICD-10-CM | POA: Diagnosis not present

## 2022-04-03 DIAGNOSIS — J9601 Acute respiratory failure with hypoxia: Secondary | ICD-10-CM | POA: Diagnosis not present

## 2022-04-03 DIAGNOSIS — J9602 Acute respiratory failure with hypercapnia: Secondary | ICD-10-CM

## 2022-04-03 DIAGNOSIS — J189 Pneumonia, unspecified organism: Secondary | ICD-10-CM | POA: Diagnosis not present

## 2022-04-03 DIAGNOSIS — A419 Sepsis, unspecified organism: Secondary | ICD-10-CM | POA: Diagnosis not present

## 2022-04-03 LAB — CBC
HCT: 34.9 % — ABNORMAL LOW (ref 36.0–46.0)
Hemoglobin: 10.9 g/dL — ABNORMAL LOW (ref 12.0–15.0)
MCH: 27.7 pg (ref 26.0–34.0)
MCHC: 31.2 g/dL (ref 30.0–36.0)
MCV: 88.8 fL (ref 80.0–100.0)
Platelets: 358 10*3/uL (ref 150–400)
RBC: 3.93 MIL/uL (ref 3.87–5.11)
RDW: 15.7 % — ABNORMAL HIGH (ref 11.5–15.5)
WBC: 32.7 10*3/uL — ABNORMAL HIGH (ref 4.0–10.5)
nRBC: 0 % (ref 0.0–0.2)

## 2022-04-03 LAB — BASIC METABOLIC PANEL
Anion gap: 8 (ref 5–15)
BUN: 22 mg/dL (ref 8–23)
CO2: 31 mmol/L (ref 22–32)
Calcium: 9.6 mg/dL (ref 8.9–10.3)
Chloride: 98 mmol/L (ref 98–111)
Creatinine, Ser: 0.72 mg/dL (ref 0.44–1.00)
GFR, Estimated: >60 mL/min (ref >60)
Glucose, Bld: 249 mg/dL — ABNORMAL HIGH (ref 70–99)
Potassium: 3.8 mmol/L (ref 3.5–5.1)
Sodium: 137 mmol/L (ref 135–145)

## 2022-04-03 LAB — MAGNESIUM: Magnesium: 1.9 mg/dL (ref 1.7–2.4)

## 2022-04-03 MED ORDER — FLUTICASONE FUROATE-VILANTEROL 100-25 MCG/ACT IN AEPB
1.0000 | INHALATION_SPRAY | Freq: Every day | RESPIRATORY_TRACT | Status: DC
  Administered 2022-04-03 – 2022-04-12 (×10): 1 via RESPIRATORY_TRACT
  Filled 2022-04-03: qty 28

## 2022-04-03 MED ORDER — ENSURE ENLIVE PO LIQD
237.0000 mL | Freq: Three times a day (TID) | ORAL | Status: DC
  Administered 2022-04-03 – 2022-04-08 (×9): 237 mL via ORAL

## 2022-04-03 MED ORDER — ADULT MULTIVITAMIN W/MINERALS CH
1.0000 | ORAL_TABLET | Freq: Every day | ORAL | Status: DC
  Administered 2022-04-03 – 2022-04-12 (×10): 1 via ORAL
  Filled 2022-04-03 (×9): qty 1

## 2022-04-03 MED ORDER — DILTIAZEM HCL 30 MG PO TABS
60.0000 mg | ORAL_TABLET | Freq: Four times a day (QID) | ORAL | Status: DC | PRN
  Administered 2022-04-03: 60 mg via ORAL
  Filled 2022-04-03: qty 2

## 2022-04-03 MED ORDER — UMECLIDINIUM BROMIDE 62.5 MCG/ACT IN AEPB
1.0000 | INHALATION_SPRAY | Freq: Every day | RESPIRATORY_TRACT | Status: DC
  Administered 2022-04-03: 1 via RESPIRATORY_TRACT
  Filled 2022-04-03: qty 7

## 2022-04-03 MED ORDER — IPRATROPIUM-ALBUTEROL 0.5-2.5 (3) MG/3ML IN SOLN
3.0000 mL | Freq: Three times a day (TID) | RESPIRATORY_TRACT | Status: DC
  Administered 2022-04-03 – 2022-04-09 (×15): 3 mL via RESPIRATORY_TRACT
  Filled 2022-04-03 (×17): qty 3

## 2022-04-03 MED ORDER — TRAMADOL HCL 50 MG PO TABS
50.0000 mg | ORAL_TABLET | Freq: Once | ORAL | Status: AC
  Administered 2022-04-03: 50 mg via ORAL
  Filled 2022-04-03: qty 1

## 2022-04-03 NOTE — Progress Notes (Signed)
?  Transition of Care (TOC) Screening Note ? ? ?Patient Details  ?Name: Jackie Turner ?Date of Birth: 09/01/1936 ? ? ?Transition of Care (TOC) CM/SW Contact:    ?Alberteen Sam, LCSW ?Phone Number: ?04/03/2022, 9:17 AM ? ? ? ?Transition of Care Department [facility]) has reviewed patient and no TOC needs have been identified at this time. We will continue to monitor patient advancement through interdisciplinary progression rounds. If new patient transition needs arise, please place a TOC consult. ? Pricilla Riffle,  ?[phone] ? ?

## 2022-04-03 NOTE — Progress Notes (Signed)
Initial Nutrition Assessment ? ?DOCUMENTATION CODES:  ? ?Not applicable ? ?INTERVENTION:  ? ?-MVI with minerals daily ?-Ensure Enlive po TID, each supplement provides 350 kcal and 20 grams of protein ?-Liberalize diet to regular ? ?NUTRITION DIAGNOSIS:  ? ?Increased nutrient needs related to chronic illness (bronchiectasis) as evidenced by estimated needs. ? ?GOAL:  ? ?Patient will meet greater than or equal to 90% of their needs ? ?MONITOR:  ? ?PO intake, Supplement acceptance, Labs, Weight trends, Skin, I & O's ? ?REASON FOR ASSESSMENT:  ? ?Consult ?Assessment of nutrition requirement/status ? ?ASSESSMENT:  ? ?Jackie Turner is a 86 y.o. female with medical history significant for Bronchiectasis, pulmonary hypertension, chronic hypoxic and hypercapnic respiratory failure on home O2 at 2 L, atrial fibrillation, no longer on Eliquis due to GI bleed and chronic iron deficiency anemia, who presents to the ED with shortness of breath and reports O2 sat of 85% on 4 L at home and speaking in short sentences.  States she has been having a productive cough for the past several days that has been worsening. ? ?Pt admitted with sepsis and pneumonia.  ? ?Spoke with pt at bedside, who reports a general decline in health over the past 4 months secondary to decreased respiratory status. Pt reports decreased appetite and usually grazes throughout the day. She reports her sister often takes her out to eat and her niece helps with grocery shopping. Pt reports she also consumes 3 Boost drinks per day and that is where the majority of her intake comes from.  ? ?Observed lunch tray- pt reports she does not feel hungry and dislikes what was brought to her. Pt friend also bought her soup from [place], which she refused to eat at this time secondary to not feeling well.  ? ?Reviewed wt hx; wt has been stable over the past 6 months.  ? ?Discussed importance of good meal and supplement intake to promote healing. Pt amenable to  continue supplements and encouraged continued use at home. RD will also liberalize diet for widest variety of meal selections.  ? ?Medications reviewed and include prednisone.   ? ?Labs reviewed: CBGS: 99.  ? ?NUTRITION - FOCUSED PHYSICAL EXAM: ? ?Flowsheet Row Most Recent Value  ?Orbital Region No depletion  ?Upper Arm Region No depletion  ?Thoracic and Lumbar Region No depletion  ?Buccal Region No depletion  ?Temple Region No depletion  ?Clavicle Bone Region No depletion  ?Clavicle and Acromion Bone Region No depletion  ?Scapular Bone Region No depletion  ?Dorsal Hand No depletion  ?Patellar Region Mild depletion  ?Anterior Thigh Region Mild depletion  ?Posterior Calf Region Mild depletion  ?Edema (RD Assessment) None  ?Hair Reviewed  ?Eyes Reviewed  ?Mouth Reviewed  ?Skin Reviewed  ?Nails Reviewed  ? ?  ? ? ?Diet Order:   ?Diet Order   ? ?       ?  Diet regular Room service appropriate? Yes; Fluid consistency: Thin  Diet effective now       ?  ? ?  ?  ? ?  ? ? ?EDUCATION NEEDS:  ? ?Education needs have been addressed ? ?Skin:  Skin Assessment: Reviewed RN Assessment ? ?Last BM:  Unknown ? ?Height:  ? ?Ht Readings from Last 1 Encounters:  ?04/01/22 4' 9.5" (1.461 m)  ? ? ?Weight:  ? ?Wt Readings from Last 1 Encounters:  ?04/01/22 52.7 kg  ? ? ?Ideal Body Weight:  43.6 kg ? ?BMI:  Body mass index is 24.71 kg/m?Marland Kitchen ? ?  Estimated Nutritional Needs:  ? ?Kcal:  1650-1850 ? ?Protein:  80-95 grams ? ?Fluid:  > 1.6 L ? ? ? ?Loistine Chance, RD, LDN, CDCES ?Registered Dietitian II ?Certified Diabetes Care and Education Specialist ?Please refer to [facility] for RD and/or RD on-call/weekend/after hours pager  ?

## 2022-04-03 NOTE — Progress Notes (Signed)
?Progress Note ? ? ?Patient: Jackie Turner WNI:[id number] DOB: 1936-08-24 DOA: 04/01/2022     1 ?DOS: the patient was seen and examined on 04/03/2022 ?  ?Brief hospital course: ?Jackie Turner is a 86 y.o. female with medical history significant for Bronchiectasis, COPD, chronic hypoxic and hypercapnic respiratory failure on home 2L O2, atrial fibrillation, no longer on Eliquis due to GI bleed and chronic iron deficiency anemia, who presented to the ED with shortness of breath and reports O2 sat of 85% on 4 L at home.  Pt stated feeling generally unwell for a while. ?  ?On arrival in rapid A-fib at 129, tachypneic to 29 with temperature 101.  O2 sat 94% on 4 L.  VBG with pH 7.4, PCO2 62, which is about baseline.  COVID and flu negative.  Chest x-ray with developing infiltrate in the right lung base.  Patient started on Levaquin, DuoNeb and Solu-Medrol.  Also started on a Cardizem infusion for A-fib with RVR.  ? ?Clinically improving. ?Still very dyspneic with minimal exertion. ? ?Assessment and Plan: ?* Sepsis ([place]) ?Febrile, tachycardic and tachypneic with leukocytosis.  Lactic acid normal ?Sepsis fluids and treat pneumonia as outlined ? ?Right lower lobe pneumonia ?IV Levaquin given multiple antibiotic allergies ?Incentive spirometry as tolerated ?DuoNebs as needed and antitussives ? ?Acute respiratory failure with hypoxia and hypercapnia (HCC) ?Possibly multifactorial related to pneumonia, COPD, bronchiectasis ?Patient requiring 4 L above her baseline of 2 secondary to pneumonia ?Continue supplemental O2 to keep sats over 92% ? ?COPD (chronic obstructive pulmonary disease) ([place]) ?Not acutely exacerbated ?DuoNebs as needed.  ?Resume  formulary equivalents for Trelegy ? ?Atrial fibrillation with RVR ([place]) ?RVR being driven by acute respiratory illness.   ?HR's today fairly stable with dilt gtt at 5 mg/hr.   ?--Continue home verapamil ?--Stop diltiazem drip ?--PRN oral Cardizem  ?--Telemetry  monitoring ? ? ?Bronchiectasis ([place]) ?Possible bronchiectasis flare due to PNA. ?DuoNebs as Q6H and PRN. ?Transition IV Solu-medrol to Prednisone. ? ? ? ? ?  ? ?Subjective: Patient up in recliner when seen this morning.  She had just finished with assisted bathing and reported still very short of breath even with that amount of exertion.  Short of breath with talking to me.  Otherwise she is reporting overall improvement since admission.  No other acute complaints at this time. ? ?Physical Exam: ?Vitals:  ? 04/03/22 0500 04/03/22 0600 04/03/22 0740 04/03/22 0749  ?BP: (!) 104/58 102/63  109/70  ?Pulse: 98 85  99  ?Resp: (!) 28 18  (!) 37  ?Temp:    97.7 ?F (36.5 ?C)  ?TempSrc:    Oral  ?SpO2: 95% 94% 98% 98%  ?Weight:      ?Height:      ? ?General exam: awake, alert, no acute distress ?HEENT: atraumatic, clear conjunctiva, anicteric sclera, moist mucus membranes, hearing grossly normal  ?Respiratory system: CTAB, tachypneic no expiratory wheezes, increased respiratory effort with conversational dyspnea, on 3 L/min [place] O2. ?Cardiovascular system: normal S1/S2, RRR, no pedal edema.   ?Central nervous system: A&O x3. no gross focal neurologic deficits, normal speech ?Extremities: moves all, no edema, normal tone ?Skin: dry, intact, normal temperature ?Psychiatry: normal mood, congruent affect, judgement and insight appear normal ? ? ?Data Reviewed: ? ?Notable labs: Glucose 249.  CBC with white count 32.7 up from 16.8 due to steroids.  Hemoglobin 10.9 from 12.2 ? ?Family Communication: None at bedside during encounter, will attempt to call as time allows ? ?Disposition: ?Status is: Inpatient ?Remains  inpatient appropriate because: Remains on IV therapies as outlined above, still with significant dyspnea on minimal exertion and with conversation.   ? ? Planned Discharge Destination: Home ? ? ? ?Time spent: 35 minutes ? ?Author: ?Ezekiel Slocumb, DO ?04/03/2022 2:56 PM ? ?For on call review [email or web address].  ?

## 2022-04-03 NOTE — Hospital Course (Addendum)
Jackie Turner is a 86 y.o. female with medical history significant for Bronchiectasis, COPD, chronic hypoxic and hypercapnic respiratory failure on home 2L O2, atrial fibrillation, no longer on Eliquis due to GI bleed and chronic iron deficiency anemia, who presented to the ED with shortness of breath and reports O2 sat of 85% on 4 L at home.  Pt stated feeling generally unwell for a while. ?  ?On arrival in rapid A-fib at 129, tachypneic to 29 with temperature 101.  O2 sat 94% on 4 L.  VBG with pH 7.4, PCO2 62, which is about baseline.  COVID and flu negative.  Chest x-ray with developing infiltrate in the right lung base.  Patient started on Levaquin, DuoNeb and Solu-Medrol.  Also started on a Cardizem infusion for A-fib with RVR.  ? ?Clinically improving. ? ?4/19: Still very dyspneic with minimal exertion. ?4/20: HRs still 110's at rest, increasing rate control ?4/21: severe abdominal pain 2/2 constipation, HR's uncontrolled ?4/22: abdomen improved, having BM's, HR's better but remain elevated in 110's at rest, adjust rate control ?4/23-24: persistent abdominal distention, constipation despite aggressive bowel regimen.  G/S and GI consulted given CT findings of gastroduodenitis and terminal ileitis. ?

## 2022-04-04 DIAGNOSIS — A419 Sepsis, unspecified organism: Secondary | ICD-10-CM | POA: Diagnosis not present

## 2022-04-04 DIAGNOSIS — J9601 Acute respiratory failure with hypoxia: Secondary | ICD-10-CM | POA: Diagnosis not present

## 2022-04-04 DIAGNOSIS — I4891 Unspecified atrial fibrillation: Secondary | ICD-10-CM | POA: Diagnosis not present

## 2022-04-04 DIAGNOSIS — J189 Pneumonia, unspecified organism: Secondary | ICD-10-CM | POA: Diagnosis not present

## 2022-04-04 LAB — CBC
HCT: 39.2 % (ref 36.0–46.0)
Hemoglobin: 12.1 g/dL (ref 12.0–15.0)
MCH: 27.5 pg (ref 26.0–34.0)
MCHC: 30.9 g/dL (ref 30.0–36.0)
MCV: 89.1 fL (ref 80.0–100.0)
Platelets: 453 10*3/uL — ABNORMAL HIGH (ref 150–400)
RBC: 4.4 MIL/uL (ref 3.87–5.11)
RDW: 16.2 % — ABNORMAL HIGH (ref 11.5–15.5)
WBC: 31.2 10*3/uL — ABNORMAL HIGH (ref 4.0–10.5)
nRBC: 0 % (ref 0.0–0.2)

## 2022-04-04 LAB — BASIC METABOLIC PANEL
Anion gap: 9 (ref 5–15)
BUN: 31 mg/dL — ABNORMAL HIGH (ref 8–23)
CO2: 31 mmol/L (ref 22–32)
Calcium: 9.6 mg/dL (ref 8.9–10.3)
Chloride: 100 mmol/L (ref 98–111)
Creatinine, Ser: 0.81 mg/dL (ref 0.44–1.00)
GFR, Estimated: >60 mL/min (ref >60)
Glucose, Bld: 149 mg/dL — ABNORMAL HIGH (ref 70–99)
Potassium: 4.3 mmol/L (ref 3.5–5.1)
Sodium: 140 mmol/L (ref 135–145)

## 2022-04-04 LAB — MAGNESIUM: Magnesium: 2.2 mg/dL (ref 1.7–2.4)

## 2022-04-04 MED ORDER — SENNOSIDES-DOCUSATE SODIUM 8.6-50 MG PO TABS
1.0000 | ORAL_TABLET | Freq: Two times a day (BID) | ORAL | Status: DC
  Administered 2022-04-04 – 2022-04-12 (×17): 1 via ORAL
  Filled 2022-04-04 (×17): qty 1

## 2022-04-04 MED ORDER — SENNOSIDES-DOCUSATE SODIUM 8.6-50 MG PO TABS: 1.0000 | ORAL_TABLET | Freq: Two times a day (BID) | ORAL | Status: DC | PRN

## 2022-04-04 MED ORDER — POLYETHYLENE GLYCOL 3350 17 G PO PACK: 17.0000 g | PACK | Freq: Every day | ORAL | Status: DC | PRN

## 2022-04-04 MED ORDER — POLYETHYLENE GLYCOL 3350 17 G PO PACK
17.0000 g | PACK | Freq: Every day | ORAL | Status: DC
  Administered 2022-04-04 – 2022-04-11 (×8): 17 g via ORAL
  Filled 2022-04-04 (×9): qty 1

## 2022-04-04 MED ORDER — DILTIAZEM HCL 30 MG PO TABS
60.0000 mg | ORAL_TABLET | Freq: Four times a day (QID) | ORAL | Status: DC
  Administered 2022-04-04 – 2022-04-05 (×4): 60 mg via ORAL
  Filled 2022-04-04 (×4): qty 2

## 2022-04-04 MED ORDER — VERAPAMIL HCL ER 180 MG PO TBCR: 180.0000 mg | EXTENDED_RELEASE_TABLET | Freq: Every day | ORAL | Status: DC

## 2022-04-04 NOTE — Progress Notes (Signed)
?Progress Note ? ? ?Patient: Jackie Turner DOB: [birth year] DOA: 04/01/2022     2 ?DOS: the patient was seen and examined on 04/04/2022 ?  ?Brief hospital course: ?Jackie Turner is a 86 y.o. female with medical history significant for Bronchiectasis, COPD, chronic hypoxic and hypercapnic respiratory failure on home 2L O2, atrial fibrillation, no longer on Eliquis due to GI bleed and chronic iron deficiency anemia, who presented to the ED with shortness of breath and reports O2 sat of 85% on 4 L at home.  Pt stated feeling generally unwell for a while. ?  ?On arrival in rapid A-fib at 129, tachypneic to 29 with temperature 101.  O2 sat 94% on 4 L.  VBG with pH 7.4, PCO2 62, which is about baseline.  COVID and flu negative.  Chest x-ray with developing infiltrate in the right lung base.  Patient started on Levaquin, DuoNeb and Solu-Medrol.  Also started on a Cardizem infusion for A-fib with RVR.  ? ?Clinically improving. ? ?4/19: Still very dyspneic with minimal exertion. ?4/20: HRs still 110's at rest, increasing rate control ? ?Assessment and Plan: ?* Sepsis ([place]) ?Febrile, tachycardic and tachypneic with leukocytosis.  Lactic acid normal ?Sepsis fluids and treat pneumonia as outlined ? ?Right lower lobe pneumonia ?IV Levaquin given multiple antibiotic allergies ?Incentive spirometry as tolerated ?DuoNebs as needed and antitussives ? ?Acute respiratory failure with hypoxia and hypercapnia (HCC) ?Possibly multifactorial related to pneumonia, COPD, bronchiectasis ?Patient requiring 4 L above her baseline of 2 secondary to pneumonia ?Continue supplemental O2 to keep sats over 92% ? ?COPD (chronic obstructive pulmonary disease) ([place]) ?Not acutely exacerbated ?DuoNebs as needed.  ?Resume  formulary equivalents for Trelegy ? ?Atrial fibrillation with RVR ([place]) ?RVR being driven by acute respiratory illness.   ?4/20: HR's in 110's today at rest.   ?--Stopped diltiazem drip 4/19  afternoon ?--Change home verapamil to PO Cardizem 60 mg q6h (verapamil originally rx'd for headache prevention, no longer has headaches) ?--Convert to long-acting Cardizem at discharge  ?--Telemetry monitoring ?--Cardiology follow up after d/c ? ? ?Bronchiectasis ([place]) ?Possible bronchiectasis flare due to PNA. ?DuoNebs as Q6H and PRN. ?Transitioned IV Solu-medrol to Prednisone. ?Continue prednisone ? ? ? ? ?  ? ?Subjective: Patient awake seated in recliner when seen today.  She reports still having some dyspnea on exertion but has not been up all that much mostly secondary to her chronic back pain from an old compression fracture.  She does report at times feeling her heart race.  Reports no BM in a few days. ? ?Physical Exam: ?Vitals:  ? 04/04/22 0824 04/04/22 1242 04/04/22 1400 04/04/22 1538  ?BP:  131/79  (!) 99/48  ?Pulse:  (!) 109  (!) 114  ?Resp:  (!) 26  18  ?Temp:  98.3 ?F (36.8 ?C)  98 ?F (36.7 ?C)  ?TempSrc:  Oral    ?SpO2: 91% 95% 96% 98%  ?Weight:      ?Height:      ? ? ?General exam: awake, alert, no acute distress, frail ?HEENT: atraumatic, clear conjunctiva, anicteric sclera, moist mucus membranes, hearing grossly normal  ?Respiratory system: CTAB, no wheezes, rales or rhonchi, mildly increased respiratory effort with conversational dyspnea although less severe than yesterday. ?Cardiovascular system: normal S1/S2, RR, no JVD, murmurs, rubs, gallops,  no pedal edema.   ?Central nervous system: A&O x3. no gross focal neurologic deficits, normal speech ?Extremities: moves all, no edema, normal tone ?Skin: dry, intact, normal temperature ?Psychiatry: normal mood, congruent affect, judgement  and insight appear normal ? ? ?Data Reviewed: ? ?Notable labs: Glucose 149, BUN 31, WBC 31.2, platelets 453 ? ?Family Communication: None at bedside on rounds, will attempt to call as time allows this afternoon ? ?Disposition: ?Status is: Inpatient ?Remains inpatient appropriate because: Heart rates remain  uncontrolled with medication changes underway.  Anticipate discharge in 24 to 48 hours once heart rate better controlled ? ? Planned Discharge Destination: Home ? ? ? ?Time spent: 35 minutes ? ?Author: ?Ezekiel Slocumb, DO ?04/04/2022 4:07 PM ? ?For on call review [email or web address].  ?

## 2022-04-05 ENCOUNTER — Inpatient Hospital Stay: Payer: Medicare HMO

## 2022-04-05 DIAGNOSIS — R109 Unspecified abdominal pain: Secondary | ICD-10-CM | POA: Diagnosis not present

## 2022-04-05 DIAGNOSIS — R1084 Generalized abdominal pain: Secondary | ICD-10-CM | POA: Diagnosis not present

## 2022-04-05 DIAGNOSIS — A419 Sepsis, unspecified organism: Secondary | ICD-10-CM | POA: Diagnosis not present

## 2022-04-05 DIAGNOSIS — I4891 Unspecified atrial fibrillation: Secondary | ICD-10-CM | POA: Diagnosis not present

## 2022-04-05 LAB — BASIC METABOLIC PANEL
Anion gap: 9 (ref 5–15)
BUN: 35 mg/dL — ABNORMAL HIGH (ref 8–23)
CO2: 35 mmol/L — ABNORMAL HIGH (ref 22–32)
Calcium: 9.8 mg/dL (ref 8.9–10.3)
Chloride: 99 mmol/L (ref 98–111)
Creatinine, Ser: 0.63 mg/dL (ref 0.44–1.00)
GFR, Estimated: >60 mL/min (ref >60)
Glucose, Bld: 121 mg/dL — ABNORMAL HIGH (ref 70–99)
Potassium: 4.6 mmol/L (ref 3.5–5.1)
Sodium: 143 mmol/L (ref 135–145)

## 2022-04-05 LAB — CBC
HCT: 40.5 % (ref 36.0–46.0)
Hemoglobin: 12.5 g/dL (ref 12.0–15.0)
MCH: 27.5 pg (ref 26.0–34.0)
MCHC: 30.9 g/dL (ref 30.0–36.0)
MCV: 89.2 fL (ref 80.0–100.0)
Platelets: 424 10*3/uL — ABNORMAL HIGH (ref 150–400)
RBC: 4.54 MIL/uL (ref 3.87–5.11)
RDW: 16.2 % — ABNORMAL HIGH (ref 11.5–15.5)
WBC: 21.4 10*3/uL — ABNORMAL HIGH (ref 4.0–10.5)
nRBC: 0 % (ref 0.0–0.2)

## 2022-04-05 LAB — MAGNESIUM: Magnesium: 2.3 mg/dL (ref 1.7–2.4)

## 2022-04-05 MED ORDER — FLEET ENEMA 7-19 GM/118ML RE ENEM
1.0000 | ENEMA | Freq: Once | RECTAL | Status: AC
  Administered 2022-04-05: 1 via RECTAL

## 2022-04-05 MED ORDER — BISACODYL 5 MG PO TBEC
5.0000 mg | DELAYED_RELEASE_TABLET | Freq: Every day | ORAL | Status: DC | PRN
  Administered 2022-04-05: 5 mg via ORAL
  Filled 2022-04-05: qty 1

## 2022-04-05 MED ORDER — SIMETHICONE 40 MG/0.6ML PO SUSP
40.0000 mg | Freq: Four times a day (QID) | ORAL | Status: DC | PRN
  Administered 2022-04-05 – 2022-04-11 (×7): 40 mg via ORAL
  Filled 2022-04-05 (×5): qty 0.6
  Filled 2022-04-05 (×2): qty 30
  Filled 2022-04-05: qty 0.6

## 2022-04-05 MED ORDER — DILTIAZEM HCL-DEXTROSE 125-5 MG/125ML-% IV SOLN (PREMIX)
5.0000 mg/h | INTRAVENOUS | Status: DC
  Administered 2022-04-05: 5 mg/h via INTRAVENOUS
  Administered 2022-04-05: 15 mg/h via INTRAVENOUS
  Filled 2022-04-05 (×4): qty 125

## 2022-04-05 MED ORDER — LACTULOSE 10 GM/15ML PO SOLN
20.0000 g | Freq: Three times a day (TID) | ORAL | Status: DC
  Administered 2022-04-05 – 2022-04-09 (×13): 20 g via ORAL
  Filled 2022-04-05 (×13): qty 30

## 2022-04-05 MED ORDER — DILTIAZEM HCL 30 MG PO TABS
90.0000 mg | ORAL_TABLET | Freq: Four times a day (QID) | ORAL | Status: DC
  Administered 2022-04-05: 90 mg via ORAL
  Filled 2022-04-05: qty 3

## 2022-04-05 NOTE — Assessment & Plan Note (Addendum)
On 4/21, due to severe constipation. ?Abdominal xray showed large stool burden and marked gaseous distention of the stomach.   ?Improved with aggressive bowel regimen ?CT abd/pelvis on 4/23 with findings of severe gastritis/duodenitis, ileitis. ?--General surgery and GI consulted, dx with Ogilvie syndrome ?

## 2022-04-05 NOTE — Progress Notes (Signed)
Cross Cover ?Patient with IBS-C. No bowel movement for 5 days.  Uses fleet enema at home and this has been ordered ? ?

## 2022-04-05 NOTE — Progress Notes (Signed)
?Progress Note ? ? ?Patient: Jackie Turner LYY:[id number] DOB: [birth year] DOA: 04/01/2022     3 ?DOS: the patient was seen and examined on 04/05/2022 ?  ?Brief hospital course: ?Jackie Turner is a 86 y.o. female with medical history significant for Bronchiectasis, COPD, chronic hypoxic and hypercapnic respiratory failure on home 2L O2, atrial fibrillation, no longer on Eliquis due to GI bleed and chronic iron deficiency anemia, who presented to the ED with shortness of breath and reports O2 sat of 85% on 4 L at home.  Pt stated feeling generally unwell for a while. ?  ?On arrival in rapid A-fib at 129, tachypneic to 29 with temperature 101.  O2 sat 94% on 4 L.  VBG with pH 7.4, PCO2 62, which is about baseline.  COVID and flu negative.  Chest x-ray with developing infiltrate in the right lung base.  Patient started on Levaquin, DuoNeb and Solu-Medrol.  Also started on a Cardizem infusion for A-fib with RVR.  ? ?Clinically improving. ? ?4/19: Still very dyspneic with minimal exertion. ?4/20: HRs still 110's at rest, increasing rate control ? ?Assessment and Plan: ?* Sepsis ([place]) ?Febrile, tachycardic and tachypneic with leukocytosis.  Lactic acid normal ?Sepsis fluids and treat pneumonia as outlined ? ?Right lower lobe pneumonia ?IV Levaquin given multiple antibiotic allergies ?Incentive spirometry as tolerated ?DuoNebs as needed and antitussives ? ?Acute respiratory failure with hypoxia and hypercapnia (HCC) ?Possibly multifactorial related to pneumonia, COPD, bronchiectasis ?Patient requiring 4 L above her baseline of 2 secondary to pneumonia ?Continue supplemental O2 to keep sats over 92% ? ?Abdominal pain ?Due to severe constipation. ?Abdominal xray showed large stool burden and marked gaseous distention of the stomach. ?--simethicone trial ?--bowel regimen per orders ?--monitor ? ?COPD (chronic obstructive pulmonary disease) ([place]) ?Not acutely exacerbated ?DuoNebs as needed.  ?Resume  formulary  equivalents for Trelegy ? ?Atrial fibrillation with RVR ([place]) ?RVR being driven by acute respiratory illness.   ?4/20: HR's in 110's today at rest. ?4/21: HR's remain uncontrolled, in the setting of severe abdominal pain ?--Previously on Cardizem drip, had transition to oral short acting Cardizem with plan to convert to long-acting Cardizem at discharge  ?--Resume Cardizem drip for today ?--Telemetry monitoring ?--Cardiology follow up after d/c versus consult if not improved ? ? ?Bronchiectasis ([place]) ?Possible bronchiectasis flare due to PNA. ?DuoNebs as Q6H and PRN. ?Transitioned IV Solu-medrol to Prednisone. ?Continue prednisone ? ? ? ? ?  ? ?Subjective: Patient seen sitting up in recliner this morning.  She is having severe abdominal pain, appears restless and very uncomfortable.  Has not had any significant bowel movement despite MiraLAX and enemas tried overnight.  She reports never feeling this way despite living with IBS for many many years.  Agreeable to try simethicone for help with gas pains related to constipation. ? ?Physical Exam: ?Vitals:  ? 04/05/22 0300 04/05/22 0456 04/05/22 1025 04/05/22 1140  ?BP:  (!) 146/70 127/79 (!) 138/91  ?Pulse: (!) 106 (!) 113 (!) 110 (!) 116  ?Resp:  '18 18 18  '$ ?Temp:  97.8 ?F (36.6 ?C) 98.2 ?F (36.8 ?C) 97.9 ?F (36.6 ?C)  ?TempSrc:  Oral    ?SpO2:  97% 97% 96%  ?Weight:      ?Height:      ? ?General exam: awake, alert, no acute distress, appears uncomfortable due to pain ?HEENT: moist mucus membranes, hearing grossly normal  ?Respiratory system: CTAB, no wheezes, rales or rhonchi, normal respiratory effort. ?Cardiovascular system: normal S1/S2, irregularly irregular, no pedal edema.   ?  Gastrointestinal system: soft, mildly distended and tender on palpation without rebound or guarding, hypoactive but present bowel sounds. ?Central nervous system: A&O x4. no gross focal neurologic deficits, normal speech ?Extremities: moves all , no edema, normal tone ?Skin: dry, intact,  normal temperature ?Psychiatry: Anxious mood, congruent affect, judgement and insight appear normal ? ? ?Data Reviewed: ? ?Notable labs: CO2 35, glucose 121, BUN 35, WBC 21.4, platelets 424 ? ?Abdominal x-ray: IMPRESSION: ?No evidence of bowel obstruction. Moderate to large stool burden. ?Marked gaseous distension of the stomach. ? ? ?Family Communication: None at bedside on rounds, will attempt to call this afternoon as time allows ? ?Disposition: ?Status is: Inpatient ?Remains inpatient appropriate because: Severe abdominal pain due to constipation despite aggressive bowel regimen.  Heart rates remain uncontrolled, resumed on IV Cardizem. ? ? Planned Discharge Destination: Home ? ? ? ?Time spent: 45 minutes ? ?Author: ?Ezekiel Slocumb, DO ?04/05/2022 3:23 PM ? ?For on call review [email or web address]  ?

## 2022-04-05 NOTE — Care Management Important Message (Signed)
Important Message ? ?Patient Details  ?Name: Jackie Turner ?MRN: [id number] ?Date of Birth: Aug 30, 1936 ? ? ?Medicare Important Message Given:  Yes ? ? ? ? ?Dannette Barbara ?04/05/2022, 12:07 PM ?

## 2022-04-05 NOTE — Progress Notes (Signed)
Patient reports not having had BM for 5 days and states she "feels constipated." PO laxatives not helpful recently. Provider overnight made aware. Fleet enema ordered/given per pt request and small BM was passed immediately after enema given. She reports still being constipated. Pt is passing gas, and has active bowel sounds. ?

## 2022-04-06 DIAGNOSIS — I4891 Unspecified atrial fibrillation: Secondary | ICD-10-CM | POA: Diagnosis not present

## 2022-04-06 DIAGNOSIS — S22070A Wedge compression fracture of T9-T10 vertebra, initial encounter for closed fracture: Secondary | ICD-10-CM | POA: Diagnosis present

## 2022-04-06 LAB — CBC
HCT: 42.1 % (ref 36.0–46.0)
Hemoglobin: 12.7 g/dL (ref 12.0–15.0)
MCH: 27.1 pg (ref 26.0–34.0)
MCHC: 30.2 g/dL (ref 30.0–36.0)
MCV: 89.8 fL (ref 80.0–100.0)
Platelets: 492 10*3/uL — ABNORMAL HIGH (ref 150–400)
RBC: 4.69 MIL/uL (ref 3.87–5.11)
RDW: 16.1 % — ABNORMAL HIGH (ref 11.5–15.5)
WBC: 19.7 10*3/uL — ABNORMAL HIGH (ref 4.0–10.5)
nRBC: 0 % (ref 0.0–0.2)

## 2022-04-06 LAB — CULTURE, BLOOD (ROUTINE X 2)
Culture: NO GROWTH
Culture: NO GROWTH
Special Requests: ADEQUATE
Special Requests: ADEQUATE

## 2022-04-06 MED ORDER — LIDOCAINE 5 % EX PTCH
1.0000 | MEDICATED_PATCH | CUTANEOUS | Status: DC
  Administered 2022-04-06 – 2022-04-12 (×7): 1 via TRANSDERMAL
  Filled 2022-04-06 (×7): qty 1

## 2022-04-06 MED ORDER — METOPROLOL TARTRATE 5 MG/5ML IV SOLN: 5.0000 mg | INTRAVENOUS | Status: DC | PRN

## 2022-04-06 MED ORDER — TRAMADOL HCL 50 MG PO TABS
50.0000 mg | ORAL_TABLET | Freq: Once | ORAL | Status: AC
  Administered 2022-04-06: 50 mg via ORAL
  Filled 2022-04-06: qty 1

## 2022-04-06 MED ORDER — DILTIAZEM HCL-DEXTROSE 125-5 MG/125ML-% IV SOLN (PREMIX): 5.0000 mg/h | INTRAVENOUS | Status: DC

## 2022-04-06 MED ORDER — METOCLOPRAMIDE HCL 5 MG/ML IJ SOLN
5.0000 mg | Freq: Once | INTRAMUSCULAR | Status: AC
  Administered 2022-04-06: 5 mg via INTRAVENOUS
  Filled 2022-04-06: qty 2

## 2022-04-06 MED ORDER — DILTIAZEM HCL ER COATED BEADS 180 MG PO CP24
360.0000 mg | ORAL_CAPSULE | Freq: Every day | ORAL | Status: DC
  Administered 2022-04-06 – 2022-04-12 (×7): 360 mg via ORAL
  Filled 2022-04-06 (×7): qty 2

## 2022-04-06 NOTE — Progress Notes (Signed)
Mobility Specialist - Progress Note ? ? ? 04/06/22 1300  ?Mobility  ?Activity Ambulated with assistance in hallway;Stood at bedside;Dangled on edge of bed  ?Level of Assistance Standby assist, set-up cues, supervision of patient - no hands on  ?Assistive Device None  ?Distance Ambulated (ft) 50 ft  ?Activity Response Tolerated well  ?$Mobility charge 1 Mobility  ? ? ? ?Pre-mobility: 94% SpO2 on 2L ?During mobility:  93-75% (unsteady pleth) SpO2 4L ?Post-mobility: HR, BP, 95% SPO2 2L ? ?Pt sitting in recliner upon arrival arrival using 2L.Bumped to 4L for OOB activity. Pt completes STS ModI and ambulates 50f voicing no complaints with CGA --- pt mildly unsteady and would benefit from use of RW but requests to use rails. Pt showed no signs of SOB but O2 reading at 75% upon resting break, reading seemed inaccurate but RN notified and bumped to 6L for return. Pt denied dizziness, SOB, or chest pain. Returned to chair with needs in reach on 2L with O2 reading at 95%.  ? ?MMerrily Brittle?Mobility Specialist ?04/06/22, 1:34 PM ?  ? ?

## 2022-04-06 NOTE — Progress Notes (Signed)
Patient alert and oriented. Patient ambulated to bathroom with assistance. Patient returned to bed, vital signs taken, patient not at rest at that point. No distress noted. Will recheck vital signs. ?

## 2022-04-06 NOTE — Progress Notes (Signed)
Patient c/o discomfort in abdomen r/t constipation. PRN and scheduled bowel medications given per MAR. Pt up to commode multiple times throughout the night w/ multiple BM's. ? ?Cardizem gtt infusing per MAR. Full assessment as documented. Call bell within reach, pt making needs known. ?

## 2022-04-06 NOTE — Assessment & Plan Note (Addendum)
Chronic, stable.  Lidocaine patch and p.o. analgesics as needed for pain control. ?

## 2022-04-06 NOTE — Progress Notes (Addendum)
?Progress Note ? ? ?Patient: Jackie Turner WIO:[id number] DOB: February 23, 1936 DOA: 04/01/2022     4 ?DOS: the patient was seen and examined on 04/06/2022 ?  ?Brief hospital course: ?Gordon Twana Wileman is a 86 y.o. female with medical history significant for Bronchiectasis, COPD, chronic hypoxic and hypercapnic respiratory failure on home 2L O2, atrial fibrillation, no longer on Eliquis due to GI bleed and chronic iron deficiency anemia, who presented to the ED with shortness of breath and reports O2 sat of 85% on 4 L at home.  Pt stated feeling generally unwell for a while. ?  ?On arrival in rapid A-fib at 129, tachypneic to 29 with temperature 101.  O2 sat 94% on 4 L.  VBG with pH 7.4, PCO2 62, which is about baseline.  COVID and flu negative.  Chest x-ray with developing infiltrate in the right lung base.  Patient started on Levaquin, DuoNeb and Solu-Medrol.  Also started on a Cardizem infusion for A-fib with RVR.  ? ?Clinically improving. ? ?4/19: Still very dyspneic with minimal exertion. ?4/20: HRs still 110's at rest, increasing rate control ? ?Assessment and Plan: ?* Sepsis ([place]) ?Febrile, tachycardic and tachypneic with leukocytosis.  Lactic acid normal ?Sepsis fluids and treat pneumonia as outlined.  Sepsis physiology improved, tachycardia and persistent due to A-fib RVR. ? ?Right lower lobe pneumonia ?Completed course of IV Levaquin.  ?Incentive spirometry. ?DuoNebs as needed and antitussives. ?Supplement O2 per protocol. ? ?Acute respiratory failure with hypoxia and hypercapnia (HCC) ?Possibly multifactorial related to pneumonia, COPD, bronchiectasis ?Patient requiring 4 L above her baseline of 2 secondary to pneumonia ?Continue supplemental O2 to keep sats over 92% ? ?Compression fracture of T10 vertebra (HCC) ?Chronic, stable.  Lidocaine patch and p.o. analgesics as needed for pain control. ? ?Abdominal pain ?On 4/21, due to severe constipation. ?Abdominal xray showed large stool burden and marked  gaseous distention of the stomach.   ?Resolved once pt had BM's with aggressive bowel regimen. ?--simethicone PRN gas pains ?--bowel regimen per orders, hold once loose or frequent stools ?--monitor ? ?COPD (chronic obstructive pulmonary disease) ([place]) ?Not acutely exacerbated ?DuoNebs as needed.  ?Resume  formulary equivalents for Trelegy ? ?Atrial fibrillation with RVR ([place]) ?RVR being driven by acute respiratory illness.   ?4/21: HR's remain uncontrolled, in the setting of severe abdominal pain ?4/22: Cardizem drip had run out early a.m., HR was 100s to 120s at rest, increased with conversation ?--Start on Cardizem CD 360 mg daily ?--Stop Cardizem drip ?--IV Lopressor PRN for HR sustaining above 110 at rest ?--Consider addition of amiodarone if HR remains uncontrolled ?--Hope to avoid beta-blockers given underlying COPD ?--Telemetry monitoring ?--Cardiology follow up after d/c versus consult if not improved ? ? ?Bronchiectasis ([place]) ?Possible bronchiectasis flare due to PNA. ?DuoNebs as Q6H and PRN. ?Transitioned IV Solu-medrol to Prednisone. ?Completed prednisone on 4/22. ? ? ? ? ?  ? ?Subjective: Patient seated up in recliner when seen on rounds today.  Her abdominal pain improved with aggressive bowel regimen and multiple BMs overnight.  Heart rates today in the 110s to up to 130 with talking during encounter.  Still seems to have conversational dyspnea.  Denies feeling short of breath, fevers chills, wheezing or other acute complaints.  She is having back discomfort secondary to her chronic compression fracture that gives her trouble at home as well.  Agreeable to try lidocaine patch. ? ?Physical Exam: ?Vitals:  ? 04/06/22 0804 04/06/22 0829 04/06/22 1100 04/06/22 1330  ?BP: 125/80  128/78   ?  Pulse: (!) 104 (!) 102 (!) 108   ?Resp: (!) 23 (!) 24 (!) 25   ?Temp: 97.6 ?F (36.4 ?C)  (!) 97.3 ?F (36.3 ?C)   ?TempSrc: Oral  Oral   ?SpO2: 99% 97% 98% 96%  ?Weight:      ?Height:      ? ?General exam: awake, alert,  no acute distress ?HEENT: moist mucus membranes, hearing grossly normal  ?Respiratory system: CTAB diminished bases right more than left, no wheezes, mildly increased respiratory effort with conversation. ?Cardiovascular system: normal S1/S2, irregularly irregular,  no pedal edema.   ?Gastrointestinal system: soft, NT, mildly distended. ?Central nervous system: A&O x3. no gross focal neurologic deficits, normal speech ?Extremities: moves all, no cyanosis, normal tone ?Skin: dry, intact, normal temperature ?Psychiatry: normal mood, congruent affect, judgement and insight appear normal ? ? ?Data Reviewed: ? ?Notable labs: WBC 19.7, platelets 492 ? ?Family Communication: None at bedside on rounds, patient updating close personal friends ? ?Disposition: ?Status is: Inpatient ?Remains inpatient appropriate because: Heart rate remains uncontrolled due to A-fib RVR with medication changes underway ? ? Planned Discharge Destination: Home ? ? ? ?Time spent: 35 minutes ? ?Author: ?Ezekiel Slocumb, DO ?04/06/2022 2:39 PM ? ?For on call review [email or web address]  ?

## 2022-04-07 ENCOUNTER — Inpatient Hospital Stay: Payer: Medicare HMO

## 2022-04-07 DIAGNOSIS — E875 Hyperkalemia: Secondary | ICD-10-CM

## 2022-04-07 LAB — CBC
HCT: 40.6 % (ref 36.0–46.0)
Hemoglobin: 12.5 g/dL (ref 12.0–15.0)
MCH: 27.3 pg (ref 26.0–34.0)
MCHC: 30.8 g/dL (ref 30.0–36.0)
MCV: 88.6 fL (ref 80.0–100.0)
Platelets: 431 10*3/uL — ABNORMAL HIGH (ref 150–400)
RBC: 4.58 MIL/uL (ref 3.87–5.11)
RDW: 15.9 % — ABNORMAL HIGH (ref 11.5–15.5)
WBC: 21.3 10*3/uL — ABNORMAL HIGH (ref 4.0–10.5)
nRBC: 0 % (ref 0.0–0.2)

## 2022-04-07 LAB — BASIC METABOLIC PANEL
Anion gap: 7 (ref 5–15)
BUN: 29 mg/dL — ABNORMAL HIGH (ref 8–23)
CO2: 37 mmol/L — ABNORMAL HIGH (ref 22–32)
Calcium: 9.5 mg/dL (ref 8.9–10.3)
Chloride: 97 mmol/L — ABNORMAL LOW (ref 98–111)
Creatinine, Ser: 0.73 mg/dL (ref 0.44–1.00)
GFR, Estimated: >60 mL/min (ref >60)
Glucose, Bld: 125 mg/dL — ABNORMAL HIGH (ref 70–99)
Potassium: 5.4 mmol/L — ABNORMAL HIGH (ref 3.5–5.1)
Sodium: 141 mmol/L (ref 135–145)

## 2022-04-07 LAB — MAGNESIUM: Magnesium: 2.4 mg/dL (ref 1.7–2.4)

## 2022-04-07 MED ORDER — AMIODARONE HCL IN DEXTROSE 360-4.14 MG/200ML-% IV SOLN
60.0000 mg/h | INTRAVENOUS | Status: AC
  Administered 2022-04-07 (×2): 60 mg/h via INTRAVENOUS
  Filled 2022-04-07: qty 200

## 2022-04-07 MED ORDER — AMIODARONE HCL IN DEXTROSE 360-4.14 MG/200ML-% IV SOLN
30.0000 mg/h | INTRAVENOUS | Status: DC
Start: 2022-04-07 — End: 2022-04-10
  Administered 2022-04-07 – 2022-04-10 (×6): 30 mg/h via INTRAVENOUS
  Filled 2022-04-07 (×6): qty 200

## 2022-04-07 MED ORDER — BISACODYL 10 MG RE SUPP: 10.0000 mg | Freq: Every day | RECTAL | Status: DC | PRN

## 2022-04-07 MED ORDER — MINERAL OIL RE ENEM
1.0000 | ENEMA | Freq: Once | RECTAL | Status: DC | PRN
Start: 2022-04-07 — End: 2022-04-12

## 2022-04-07 MED ORDER — AMIODARONE LOAD VIA INFUSION
150.0000 mg | Freq: Once | INTRAVENOUS | Status: AC
Start: 2022-04-07 — End: 2022-04-07
  Administered 2022-04-07: 150 mg via INTRAVENOUS
  Filled 2022-04-07: qty 83.34

## 2022-04-07 MED ORDER — SODIUM ZIRCONIUM CYCLOSILICATE 5 G PO PACK
5.0000 g | PACK | Freq: Once | ORAL | Status: AC
  Administered 2022-04-07: 5 g via ORAL
  Filled 2022-04-07: qty 1

## 2022-04-07 NOTE — Plan of Care (Signed)

## 2022-04-08 ENCOUNTER — Inpatient Hospital Stay: Payer: Medicare HMO

## 2022-04-08 ENCOUNTER — Ambulatory Visit: Payer: Medicare HMO | Admitting: Primary Care

## 2022-04-08 DIAGNOSIS — R140 Abdominal distension (gaseous): Secondary | ICD-10-CM

## 2022-04-08 DIAGNOSIS — K50019 Crohn's disease of small intestine with unspecified complications: Secondary | ICD-10-CM

## 2022-04-08 DIAGNOSIS — I4891 Unspecified atrial fibrillation: Secondary | ICD-10-CM | POA: Diagnosis not present

## 2022-04-08 DIAGNOSIS — K2990 Gastroduodenitis, unspecified, without bleeding: Secondary | ICD-10-CM | POA: Clinically undetermined

## 2022-04-08 DIAGNOSIS — K567 Ileus, unspecified: Secondary | ICD-10-CM | POA: Diagnosis not present

## 2022-04-08 DIAGNOSIS — R1084 Generalized abdominal pain: Secondary | ICD-10-CM | POA: Diagnosis not present

## 2022-04-08 DIAGNOSIS — K5000 Crohn's disease of small intestine without complications: Secondary | ICD-10-CM | POA: Clinically undetermined

## 2022-04-08 DIAGNOSIS — R911 Solitary pulmonary nodule: Secondary | ICD-10-CM | POA: Clinically undetermined

## 2022-04-08 LAB — COMPREHENSIVE METABOLIC PANEL
ALT: 23 U/L (ref 0–44)
AST: 14 U/L — ABNORMAL LOW (ref 15–41)
Albumin: 3.4 g/dL — ABNORMAL LOW (ref 3.5–5.0)
Alkaline Phosphatase: 63 U/L (ref 38–126)
Anion gap: 10 (ref 5–15)
BUN: 31 mg/dL — ABNORMAL HIGH (ref 8–23)
CO2: 34 mmol/L — ABNORMAL HIGH (ref 22–32)
Calcium: 9.1 mg/dL (ref 8.9–10.3)
Chloride: 94 mmol/L — ABNORMAL LOW (ref 98–111)
Creatinine, Ser: 0.69 mg/dL (ref 0.44–1.00)
GFR, Estimated: >60 mL/min (ref >60)
Glucose, Bld: 145 mg/dL — ABNORMAL HIGH (ref 70–99)
Potassium: 4.2 mmol/L (ref 3.5–5.1)
Sodium: 138 mmol/L (ref 135–145)
Total Bilirubin: 0.4 mg/dL (ref 0.3–1.2)
Total Protein: 6.3 g/dL — ABNORMAL LOW (ref 6.5–8.1)

## 2022-04-08 LAB — CBC
HCT: 41.2 % (ref 36.0–46.0)
Hemoglobin: 12.9 g/dL (ref 12.0–15.0)
MCH: 27.4 pg (ref 26.0–34.0)
MCHC: 31.3 g/dL (ref 30.0–36.0)
MCV: 87.7 fL (ref 80.0–100.0)
Platelets: 428 10*3/uL — ABNORMAL HIGH (ref 150–400)
RBC: 4.7 MIL/uL (ref 3.87–5.11)
RDW: 15.9 % — ABNORMAL HIGH (ref 11.5–15.5)
WBC: 19.9 10*3/uL — ABNORMAL HIGH (ref 4.0–10.5)
nRBC: 0 % (ref 0.0–0.2)

## 2022-04-08 LAB — MAGNESIUM: Magnesium: 2.3 mg/dL (ref 1.7–2.4)

## 2022-04-08 LAB — LIPASE, BLOOD: Lipase: 28 U/L (ref 11–51)

## 2022-04-08 MED ORDER — TRAMADOL HCL 50 MG PO TABS
50.0000 mg | ORAL_TABLET | Freq: Once | ORAL | Status: AC
Start: 2022-04-08 — End: 2022-04-08
  Administered 2022-04-08: 50 mg via ORAL
  Filled 2022-04-08: qty 1

## 2022-04-08 MED ORDER — PANTOPRAZOLE SODIUM 40 MG IV SOLR
40.0000 mg | Freq: Two times a day (BID) | INTRAVENOUS | Status: DC
  Administered 2022-04-08 – 2022-04-10 (×4): 40 mg via INTRAVENOUS
  Filled 2022-04-08 (×4): qty 10

## 2022-04-08 NOTE — Consult Note (Signed)
[facility] ?SURGICAL CONSULTATION NOTE (initial) - cpt: 52841 ? ? ?HISTORY OF PRESENT ILLNESS (HPI):  ?86 y.o. female presented to [facility] ED initially on 04/17 for evaluation of SOB. At that time, it appears patient was having a productive cough and worsening dyspnea for a few days. Work up was concerning for RLL PNA and Afib with RVR. She was admitted to the Hospitalist and started on Levaquin and Cardizem. Appears that on 04/21, she began to complain of worsening abdominal pain and constipation. Seems she has been given Miralax, mineral enema, suppositories, and lactulose for this. Notes from 04/22 seem to indicate some improvement in her symptoms.She does endorse a history of IBS in the past, but this is much worse. However, CT Abdomen/Pelvis without contrast was obtained on 04/23 and concerning for questionable gastroenteritis, duodenitis, ileitis and constipation however examination is very limited due to lack of contrast. Most recent labs revealed, and overall improving leukocytosis to 19.7K (peaked at 32.7K), renal function is normal with sCr - 0.69, no significant electrolyte derangement. She has a KUB pending this morning.  ? ?This morning she reports that she initially felt better after have a few bowel movements, but distension returned within an hour after this. She has decreased appetite and nausea with PO intake.  ? ?Surgery is consulted by hospitalist physician Dr. Nicole Kindred, DO in this context for evaluation and management of the above. ? ?PAST MEDICAL HISTORY (PMH):  ?Past Medical History:  ?Diagnosis Date  ? Allergy   ? Asthma   ? Hyperlipidemia   ? Osteoporosis   ?  ? ?PAST SURGICAL HISTORY ([place]):  ?Past Surgical History:  ?Procedure Laterality Date  ? ABDOMINAL HYSTERECTOMY  2003  ? [place]  ? benign  ? carpal tunnel repair Bilateral   ? R 1990; L 1997  ? COLONOSCOPY WITH PROPOFOL N/A 08/14/2020  ? Procedure: COLONOSCOPY WITH PROPOFOL;  Surgeon: Lucilla Lame,  MD;  Location: [facility] ENDOSCOPY;  Service: Endoscopy;  Laterality: N/A;  ? ESOPHAGOGASTRODUODENOSCOPY N/A 08/12/2020  ? Procedure: ESOPHAGOGASTRODUODENOSCOPY (EGD);  Surgeon: Lin Landsman, MD;  Location: [facility] ENDOSCOPY;  Service: Gastroenterology;  Laterality: N/A;  ? EYE SURGERY Bilateral   ? cataract extraction  ? HAND TENDON SURGERY Right   ? Trigger finger  ? IR RADIOLOGIST EVAL & MGMT  06/13/2017  ? IR VERTEBROPLASTY CERV/THOR BX INC UNI/BIL INC/INJECT/IMAGING  06/17/2017  ? TONSILLECTOMY  1961  ?  ? ?MEDICATIONS:  ?Prior to Admission medications   ?Medication Sig Start Date End Date Taking? Authorizing Provider  ?albuterol (PROVENTIL) (2.5 MG/3ML) 0.083% nebulizer solution USE 3 ML BY NEBULIZATION EVERY 6 HOURS AS NEEDED FOR WHEEZING OR SHORTNESS OF BREATH 02/07/22  Yes Tyler Pita, MD  ?Calcium Carb-Cholecalciferol 600-400 MG-UNIT TABS Take 2 tablets by mouth 2 (two) times daily.   Yes [provider]  ?dicyclomine (BENTYL) 10 MG capsule TAKE 1 CAPSULE BY MOUTH 3 TIMES DAILY ASNEEDED FOR SPASMS 02/07/22  Yes Bacigalupo, Dionne Bucy, MD  ?doxepin (SINEQUAN) 10 MG capsule TAKE 3 CAPSULES BY MOUTH AT BEDTIME 01/18/22  Yes Bacigalupo, Dionne Bucy, MD  ?ferrous gluconate (FERGON) 324 MG tablet Take 324 mg by mouth daily with breakfast.   Yes [provider]  ?fluticasone (FLONASE) 50 MCG/ACT nasal spray Place 2 sprays into both nostrils daily. ?Patient taking differently: Place 2 sprays into both nostrils daily as needed. 05/31/21  Yes Jerrol Banana., MD  ?furosemide (LASIX) 40 MG tablet Take 1 tablet (40 mg total) by  mouth daily as needed for edema. 10/30/20  Yes Mar Daring, PA-C  ?melatonin 3 MG TABS tablet Take 1 tablet (3 mg total) by mouth at bedtime as needed. 11/19/21  Yes Bacigalupo, Dionne Bucy, MD  ?Multiple Vitamins-Minerals (PRESERVISION AREDS PO) Take 2 tablets by mouth 2 (two) times daily.   Yes [provider]  ?nystatin (MYCOSTATIN) 100000 UNIT/ML suspension  TAKE 5 MLS BY MOUTH 4 TIMES A DAY. ?Patient taking differently: Take 5 mLs by mouth 4 (four) times daily as needed. 10/30/20  Yes Mar Daring, PA-C  ?oxybutynin (DITROPAN XL) 15 MG 24 hr tablet Take 1 tablet (15 mg total) by mouth at bedtime. ?Patient taking differently: Take 15 mg by mouth at bedtime as needed. 06/22/20  Yes Mar Daring, PA-C  ?pentosan polysulfate (ELMIRON) 100 MG capsule Take 1 capsule (100 mg total) by mouth 2 (two) times daily as needed (urinary pain). 10/30/20  Yes Mar Daring, PA-C  ?traMADol (ULTRAM) 50 MG tablet Take 1 tablet (50 mg total) by mouth every 8 (eight) hours as needed. for pain ?Patient taking differently: Take 50-100 mg by mouth daily as needed. for back pain 09/28/21  Yes Birdie Sons, MD  ?Donnal Debar 100-62.5-25 MCG/INH AEPB INHALE 1 PUFF INTO THE LUNGS DAILY 07/13/21  Yes Bacigalupo, Dionne Bucy, MD  ?VENTOLIN HFA 108 (90 Base) MCG/ACT inhaler Inhale 2 puffs into the lungs every 4 (four) hours as needed for wheezing or shortness of breath. ?Patient taking differently: Inhale 2 puffs into the lungs every 2 (two) hours as needed for wheezing or shortness of breath. 08/23/21  Yes Bacigalupo, Dionne Bucy, MD  ?verapamil (CALAN-SR) 120 MG CR tablet TAKE 1 TABLET BY MOUTH AT BEDTIME 03/01/22  Yes Bacigalupo, Dionne Bucy, MD  ?cetirizine (ZYRTEC) 10 MG tablet Take '5mg'$  daily for 1-2 weeks. Then increase to '10mg'$  daily if tolerating well. ?Patient not taking: Reported on 04/02/2022 01/23/22   Mecum, Erin E, PA-C  ?feeding supplement, ENSURE ENLIVE, (ENSURE ENLIVE) LIQD Take 237 mLs by mouth 2 (two) times daily between meals. 06/16/20   Hongalgi, Lenis Dickinson, MD  ?OXYGEN Inhale 2 L into the lungs daily.    [provider]  ?  ? ?ALLERGIES:  ?Allergies  ?Allergen Reactions  ? Nsaids Other (See Comments)  ?  History of gastric ulcer  ? Amoxicillin Nausea Only and Rash  ? Boniva [Ibandronic Acid] Other (See Comments)  ?  Muscle weakness - advised not to  take ? ?**BONIVA** - drug name  ? Actonel  [Risedronate] Other (See Comments)  ?  Gastric ulcers  ? Antihistamines, Chlorpheniramine-Type   ? Aspirin   ?  Gastric ulcer  ? Buspar [Buspirone] Other (See Comments)  ?  Pt does not remember reaction   ? Butalbital-Aspirin-Caffeine Other (See Comments)  ?  Other reaction(s): Unknown  ? Clarithromycin Other (See Comments)  ?  Bloating ? ?Binnie Rail*  ? Codeine   ? Gatifloxacin Other (See Comments)  ? Hydrocodone-Guaifenesin Nausea Only  ?  CODICLEAR [place]   ? Moxifloxacin   ? Oxycodone Other (See Comments)  ?  Dizziness  ? Penicillins Other (See Comments)  ? Risedronate Sodium   ?  Gastric ulcer  ? Tylenol With Codeine #3  [Acetaminophen-Codeine]   ?  Other reaction(s): Unknown  ? Azithromycin Other (See Comments)  ?  Pt states med causes severe abd pain  ? Raloxifene Other (See Comments)  ?  Bloating ?Bloating ? ?*EVISTA*  ?  ? ?SOCIAL HISTORY:  ?  Social History  ? ?Socioeconomic History  ? Marital status: Widowed  ?  Spouse name: [name]  ? Number of children: 0  ? Years of education: H/S  ? Highest education level: 12th grade  ?Occupational History  ? Occupation: Retired  ?Tobacco Use  ? Smoking status: Never  ? Smokeless tobacco: Never  ?Vaping Use  ? Vaping Use: Never used  ?Substance and Sexual Activity  ? Alcohol use: No  ? Drug use: No  ? Sexual activity: Never  ?Other Topics Concern  ? Not on file  ?Social History Narrative  ? Not on file  ? ?Social Determinants of Health  ? ?Financial Resource Strain: Low Risk   ? Difficulty of Paying Living Expenses: Not hard at all  ?Food Insecurity: No Food Insecurity  ? Worried About Charity fundraiser in the Last Year: Never true  ? Ran Out of Food in the Last Year: Never true  ?Transportation Needs: No Transportation Needs  ? Lack of Transportation (Medical): No  ? Lack of Transportation (Non-Medical): No  ?Physical Activity: Inactive  ? Days of Exercise per Week: 0 days  ? Minutes of Exercise per Session: 0 min  ?Stress:  No Stress Concern Present  ? Feeling of Stress : Not at all  ?Social Connections: Unknown  ? Frequency of Communication with Friends and Family: Not on file  ? Frequency of Social Gatherings with Friends and Family:

## 2022-04-08 NOTE — Care Management Important Message (Signed)
Important Message ? ?Patient Details  ?Name: Jackie Turner ?MRN: [id number] ?Date of Birth: [birth year] ? ? ?Medicare Important Message Given:  Yes ? ? ? ? ?Dannette Barbara ?04/08/2022, 11:59 AM ?

## 2022-04-08 NOTE — Consult Note (Signed)
? ? ?Lucilla Lame, MD [facility]  ?[address] ?[place], [place] [address] ?Phone: [phone] ?Fax : [phone] ? Consultation ? ?Referring Provider:     Dr. Arbutus Ped ?Primary Care Physician:  Virginia Crews, MD ?Primary Gastroenterologist:  Dr. Marius Ditch         ?Reason for Consultation:     Abnormal imaging ? ?Date of Admission:  04/01/2022 ?Date of Consultation:  04/08/2022 ?       ? HPI:   ?Jackie Turner is a 86 y.o. female who is known to me from having a colonoscopy in the past.  The patient had a colonoscopy in 2021 by me after being seen by Dr. Bonna Gains and Dr. Marius Ditch.  The patient had a large polyp on the ileocecal valve that was not removed and the patient was recommended to have surgery.  The pathology came back as a tubular adenoma.  The patient was not taken to surgery due to her functional status at the time.  The patient was now admitted with shortness of breath with a history of COPD, pulmonary hypertension, bronchiectasis and chronic respiratory failure.  The patient was on Eliquis but it was stopped due to a GI bleed and chronic iron deficiency anemia.  The patient underwent a CT scan of the abdomen yesterday for abdominal pain and distention.  The patient CT scan yesterday showed: ? ?IMPRESSION: ?1. Significant findings in both the upper and lower intestinal ?tracts. Moderate circumferential gastric antral and duodenal wall ?thickening is seen with surrounding inflammatory reaction, but no ?free air. Inflammatory changes present adjacent the head and neck of pancreas but may have tracked inferiorly from the gastroduodenal area. ?2. Findings , may reflect severe gastroduodenitis/pancreatitis or ?gastroduodenal ulcerative disease with neoplasm in the differential ?as well. Endoscopic follow-up recommended. Laboratory and clinical correlation for pancreatitis also recommended. Less likely the findings could be primary pancreatitis with reactive gastroduodenal thickening. ?3. More  inferiorly, the terminal 7 cm of the ileum is severely ?thickened with trace inflammatory stranding and adjacent mildly ?enlarged lymph nodes in the ileocolic mesentery just above this, ?with thickening in the adjacent medial cecum. ?4. No upstream bowel obstruction is evident. Findings could be ?severe terminal ileitis or infiltrating neoplasm. Additional mildly ?prominent mesenteric nodes in the left upper abdominal hypogastrium. ?5. Cardiomegaly with small left and trace right pleural effusions, ?with body wall anasarca in the flanks and upper thighs, favored most likely fluid overload or congestive, with decreased density of the intracardiac blood pool consistent with anemia. ?6. Minimal ascites in the abdomen and pelvis. No free air, ?hemorrhage or abscess. ?7. New 6 mm noncalcified pleural-based right lower lobe nodule. ?Recommend a non-contrast Chest CT at 6-12 months. If patient is high risk for malignancy, recommend an additional non-contrast Chest CT at 18-24 months; if patient is low risk for malignancy a ?non-contrast Chest CT at 18-24 months is optional. These guidelines do not apply to immunocompromised patients and patients with cancer. ?Follow up in patients with significant comorbidities as clinically ?warranted. For lung cancer screening, adhere to Lung-RADS ?guidelines. Reference: Radiology. 2017; 284(1):228-43. ?8. Osteopenia, scoliosis and degenerative change ? ?The patient then had a KUB that showed gaseous distention of the colon and some small bowel loops likely ileus.  ? ?The patient reports that she came in with pneumonia did not have any of these GI problem told she had been in the hospital. ? ?Past Medical History:  ?Diagnosis Date  ? Allergy   ? Asthma   ? Hyperlipidemia   ?  Osteoporosis   ? ? ?Past Surgical History:  ?Procedure Laterality Date  ? ABDOMINAL HYSTERECTOMY  2003  ? [place]  ? benign  ? carpal tunnel repair Bilateral   ? R 1990; L 1997  ? COLONOSCOPY WITH  PROPOFOL N/A 08/14/2020  ? Procedure: COLONOSCOPY WITH PROPOFOL;  Surgeon: Lucilla Lame, MD;  Location: [facility] ENDOSCOPY;  Service: Endoscopy;  Laterality: N/A;  ? ESOPHAGOGASTRODUODENOSCOPY N/A 08/12/2020  ? Procedure: ESOPHAGOGASTRODUODENOSCOPY (EGD);  Surgeon: Lin Landsman, MD;  Location: [facility] ENDOSCOPY;  Service: Gastroenterology;  Laterality: N/A;  ? EYE SURGERY Bilateral   ? cataract extraction  ? HAND TENDON SURGERY Right   ? Trigger finger  ? IR RADIOLOGIST EVAL & MGMT  06/13/2017  ? IR VERTEBROPLASTY CERV/THOR BX INC UNI/BIL INC/INJECT/IMAGING  06/17/2017  ? TONSILLECTOMY  1961  ? ? ?Prior to Admission medications   ?Medication Sig Start Date End Date Taking? Authorizing Provider  ?albuterol (PROVENTIL) (2.5 MG/3ML) 0.083% nebulizer solution USE 3 ML BY NEBULIZATION EVERY 6 HOURS AS NEEDED FOR WHEEZING OR SHORTNESS OF BREATH 02/07/22  Yes Tyler Pita, MD  ?Calcium Carb-Cholecalciferol 600-400 MG-UNIT TABS Take 2 tablets by mouth 2 (two) times daily.   Yes [provider]  ?dicyclomine (BENTYL) 10 MG capsule TAKE 1 CAPSULE BY MOUTH 3 TIMES DAILY ASNEEDED FOR SPASMS 02/07/22  Yes Bacigalupo, Dionne Bucy, MD  ?doxepin (SINEQUAN) 10 MG capsule TAKE 3 CAPSULES BY MOUTH AT BEDTIME 01/18/22  Yes Bacigalupo, Dionne Bucy, MD  ?ferrous gluconate (FERGON) 324 MG tablet Take 324 mg by mouth daily with breakfast.   Yes [provider]  ?fluticasone (FLONASE) 50 MCG/ACT nasal spray Place 2 sprays into both nostrils daily. ?Patient taking differently: Place 2 sprays into both nostrils daily as needed. 05/31/21  Yes Jerrol Banana., MD  ?furosemide (LASIX) 40 MG tablet Take 1 tablet (40 mg total) by mouth daily as needed for edema. 10/30/20  Yes Mar Daring, PA-C  ?melatonin 3 MG TABS tablet Take 1 tablet (3 mg total) by mouth at bedtime as needed. 11/19/21  Yes Bacigalupo, Dionne Bucy, MD  ?Multiple Vitamins-Minerals (PRESERVISION AREDS PO) Take 2 tablets by mouth 2 (two) times daily.   Yes  [provider]  ?nystatin (MYCOSTATIN) 100000 UNIT/ML suspension TAKE 5 MLS BY MOUTH 4 TIMES A DAY. ?Patient taking differently: Take 5 mLs by mouth 4 (four) times daily as needed. 10/30/20  Yes Mar Daring, PA-C  ?oxybutynin (DITROPAN XL) 15 MG 24 hr tablet Take 1 tablet (15 mg total) by mouth at bedtime. ?Patient taking differently: Take 15 mg by mouth at bedtime as needed. 06/22/20  Yes Mar Daring, PA-C  ?pentosan polysulfate (ELMIRON) 100 MG capsule Take 1 capsule (100 mg total) by mouth 2 (two) times daily as needed (urinary pain). 10/30/20  Yes Mar Daring, PA-C  ?traMADol (ULTRAM) 50 MG tablet Take 1 tablet (50 mg total) by mouth every 8 (eight) hours as needed. for pain ?Patient taking differently: Take 50-100 mg by mouth daily as needed. for back pain 09/28/21  Yes Birdie Sons, MD  ?Donnal Debar 100-62.5-25 MCG/INH AEPB INHALE 1 PUFF INTO THE LUNGS DAILY 07/13/21  Yes Bacigalupo, Dionne Bucy, MD  ?VENTOLIN HFA 108 (90 Base) MCG/ACT inhaler Inhale 2 puffs into the lungs every 4 (four) hours as needed for wheezing or shortness of breath. ?Patient taking differently: Inhale 2 puffs into the lungs every 2 (two) hours as needed for wheezing or shortness of breath. 08/23/21  Yes Bacigalupo, Dionne Bucy, MD  ?verapamil (CALAN-SR) 120 MG CR tablet TAKE 1 TABLET BY MOUTH AT BEDTIME 03/01/22  Yes Bacigalupo, Dionne Bucy, MD  ?cetirizine (ZYRTEC) 10 MG tablet Take '5mg'$  daily for 1-2 weeks. Then increase to '10mg'$  daily if tolerating well. ?Patient not taking: Reported on 04/02/2022 01/23/22   Mecum, Erin E, PA-C  ?feeding supplement, ENSURE ENLIVE, (ENSURE ENLIVE) LIQD Take 237 mLs by mouth 2 (two) times daily between meals. 06/16/20   Hongalgi, Lenis Dickinson, MD  ?OXYGEN Inhale 2 L into the lungs daily.    [provider]  ? ? ?Family History  ?Problem Relation Age of Onset  ? Heart disease Mother   ? CAD Mother   ? Congestive Heart Failure Mother   ? Osteoarthritis Mother   ? Cancer Sister    ?     breast  ? Breast cancer Sister   ?  ? ?Social History  ? ?Tobacco Use  ? Smoking status: Never  ? Smokeless tobacco: Never  ?Vaping Use  ? Vaping Use: Never used  ?Substance Use Topics  ? Alcohol use: N

## 2022-04-08 NOTE — Assessment & Plan Note (Signed)
New 6 mm noncalcified pleural-bases RLL nodule noted on CT scan 4/23.  See report.   ?Per radiology: "Recommend a non-contrast Chest CT at 6-12 months. If patient is high risk for malignancy, recommend an additional non-contrast Chest CT at 18-24 months; if patient is low risk for malignancy a non-contrast Chest CT at 18-24 months is optional. These guidelines do not apply to immunocompromised patients and patients with cancer.  Follow up in patients with significant comorbidities as clinically warranted." ?

## 2022-04-08 NOTE — Progress Notes (Signed)
?Progress Note ? ? ?Patient: Jackie Turner TFT:[id number] DOB: Jun 20, 1936 DOA: 04/01/2022     6 ?DOS: the patient was seen and examined on 04/08/2022 ?  ?Brief hospital course: ?Jackie Turner is a 86 y.o. female with medical history significant for Bronchiectasis, COPD, chronic hypoxic and hypercapnic respiratory failure on home 2L O2, atrial fibrillation, no longer on Eliquis due to GI bleed and chronic iron deficiency anemia, who presented to the ED with shortness of breath and reports O2 sat of 85% on 4 L at home.  Pt stated feeling generally unwell for a while. ?  ?On arrival in rapid A-fib at 129, tachypneic to 29 with temperature 101.  O2 sat 94% on 4 L.  VBG with pH 7.4, PCO2 62, which is about baseline.  COVID and flu negative.  Chest x-ray with developing infiltrate in the right lung base.  Patient started on Levaquin, DuoNeb and Solu-Medrol.  Also started on a Cardizem infusion for A-fib with RVR.  ? ?Clinically improving. ? ?4/19: Still very dyspneic with minimal exertion. ?4/20: HRs still 110's at rest, increasing rate control ?4/21: severe abdominal pain 2/2 constipation, HR's uncontrolled ?4/22: abdomen improved, having BM's, HR's better but remain elevated in 110's at rest, adjust rate control ?4/23-24: persistent abdominal distention, constipation despite aggressive bowel regimen.  G/S and GI consulted given CT findings of gastroduodenitis and terminal ileitis. ? ?Assessment and Plan: ?* Sepsis ([place]) ?Febrile, tachycardic and tachypneic with leukocytosis.  Lactic acid normal ?Sepsis fluids and treat pneumonia as outlined.  Sepsis physiology improved, tachycardia and persistent due to A-fib RVR. ? ?Right lower lobe pneumonia ?Completed course of IV Levaquin.  ?Incentive spirometry. ?DuoNebs as needed and antitussives. ?Supplement O2 per protocol. ? ?Acute respiratory failure with hypoxia and hypercapnia (HCC) ?Possibly multifactorial related to pneumonia, COPD, bronchiectasis ?Patient  requiring 4 L above her baseline of 2 secondary to pneumonia ?Continue supplemental O2 to keep sats over 92% ? ?Pulmonary nodule ?New 6 mm noncalcified pleural-bases RLL nodule noted on CT scan 4/23.  See report.   ?Per radiology: "Recommend a non-contrast Chest CT at 6-12 months. If patient is high risk for malignancy, recommend an additional non-contrast Chest CT at 18-24 months; if patient is low risk for malignancy a non-contrast Chest CT at 18-24 months is optional. These guidelines do not apply to immunocompromised patients and patients with cancer.  Follow up in patients with significant comorbidities as clinically warranted." ? ?Terminal ileitis ([place]) ?Seen on CT scan 4/23, also with severe gastroduodenitis. ?Follow GI and surgery recommendations.   ?Pt with IBS-C history.   ?No prior diagnosis of IBD (UC or Crohn's). ? ?Gastroduodenitis ?See CT report from 4/23. ?--GI and surgery consulted ?--start IV PPI BID for now ? ?Hyperkalemia ?K 5.4 on 4/23, given single dose Lokelma ?Resolved, K 4.2. ?Monitor BMP. ? ?Compression fracture of T10 vertebra ([place]) ?Chronic, stable.  Lidocaine patch and p.o. analgesics as needed for pain control. ? ?Abdominal pain ?On 4/21, due to severe constipation. ?Abdominal xray showed large stool burden and marked gaseous distention of the stomach.   ?Improved with aggressive bowel regimen, but only small BM.  Now recurrent / persistent. ?CT abd/pelvis on 4/23 with findings of severe gastritis/duodenitis, ileitis. ?--General surgery and GI consulted - follow up recommendations ?--simethicone PRN gas pains ?--bowel regimen per orders, hold once loose or frequent stools ?--monitor abdominal exam closely ? ?COPD (chronic obstructive pulmonary disease) ([place]) ?Not acutely exacerbated ?DuoNebs as needed.  ?Resume  formulary equivalents for Trelegy ? ?Atrial fibrillation  with RVR ([place]) ?RVR being driven by acute illness, initially respiratory, now GI issues ongoing.   ?4/21: HR's remain  uncontrolled, in the setting of severe abdominal pain ?4/22: Cardizem drip had run out early a.m., HR was 100s to 120s at rest, increased with conversation ?4/23: resumed amiodarone drip ?4/24: resting HR in low 100's today, better ?--continue amiodarone gtt for now ?--Start on Cardizem CD 360 mg daily ?--Stop Cardizem drip ?--IV Lopressor PRN for HR sustaining above 110 at rest ?--Hope to avoid beta-blockers given underlying COPD ?--Telemetry monitoring ?--Cardiology follow up after d/c versus consult if not improved ? ? ?Bronchiectasis ([place]) ?Possible bronchiectasis flare due to PNA. ?DuoNebs as Q6H and PRN. ?Transitioned IV Solu-medrol to Prednisone. ?Completed prednisone on 4/22. ? ? ? ? ?  ? ?Subjective: Patient was sleeping very soundly in recliner when seen on rounds this morning.  Returned this afternoon she was still sleeping in the recliner but this time woke very easily to voice.  She reports ongoing abdominal pain and distention, very uncomfortable.  Has had a miserable day between the room being too hot and too cold, not able to get comfortable, not really tolerating food.  No other acute complaints at this time.  She is anxious to hear what GI has to say about the issues she is having with her abdomen. ? ?Physical Exam: ?Vitals:  ? 04/08/22 0800 04/08/22 1000 04/08/22 1100 04/08/22 1200  ?BP:    124/83  ?Pulse:  (!) 106 98 98  ?Resp:    20  ?Temp: 97.8 ?F (36.6 ?C)   97.8 ?F (36.6 ?C)  ?TempSrc: Oral   Oral  ?SpO2: 90% 99% 99% 96%  ?Weight:      ?Height:      ? ?General exam: awake, alert, no acute distress, frail ?HEENT: Dry mucus membranes, hearing grossly normal  ?Respiratory system: CTAB diminished bases, no wheezes, rales or rhonchi, normal respiratory effort.  On 2 L minute [place] O2 ?Cardiovascular system: normal S1/S2, irregularly irregular, 1+ lower extremity edema.   ?Gastrointestinal system: Distended, tender on palpation, unable to appreciate bowel sounds. ?Central nervous system: A&O x3. no  gross focal neurologic deficits, normal speech ?Extremities: moves all, no cyanosis, normal tone ?Skin: dry, intact, normal temperature ?Psychiatry: normal mood, congruent affect, judgement and insight appear normal ? ? ?Data Reviewed: ? ?Notable labs: Chloride 94, CO2 34, glucose 145, BUN 31, albumin 3.4, AST 14, total protein 6.3, WBC 19.9, platelets 428 ? ?CT abdomen pelvis from yesterday evening -- ?IMPRESSION: ?1. Significant findings in both the upper and lower intestinal ?tracts. Moderate circumferential gastric antral and duodenal wall ?thickening is seen with surrounding inflammatory reaction, but no ?free air. Inflammatory changes present adjacent the head and neck of ?pancreas but may have tracked inferiorly from the gastroduodenal ?area. ?2. Findings may reflect severe gastroduodenitis/pancreatitis, or ?gastroduodenal ulcerative disease with neoplasm in the differential ?as well. Endoscopic follow-up recommended. Laboratory and clinical ?correlation for pancreatitis also recommended. Less likely the ?findings could be primary pancreatitis with reactive gastroduodenal ?thickening. ?3. More inferiorly, the terminal 7 cm of the ileum is severely ?thickened with trace inflammatory stranding and adjacent mildly ?enlarged lymph nodes in the ileocolic mesentery just above this, ?with thickening in the adjacent medial cecum. ?4. No upstream bowel obstruction is evident. Findings could be ?severe terminal ileitis or infiltrating neoplasm. Additional mildly ?prominent mesenteric nodes in the left upper abdominal hypogastrium. ?5. Cardiomegaly with small left and trace right pleural effusions, ?with body wall anasarca in the flanks and upper  thighs, favored most ?likely fluid overload or congestive, with decreased density of the ?intracardiac blood pool consistent with anemia. ?6. Minimal ascites in the abdomen and pelvis. No free air, ?hemorrhage or abscess. ?7. New 6 mm noncalcified pleural-based right lower lobe  nodule. ?Recommend a non-contrast Chest CT at 6-12 months. If patient is high ?risk for malignancy, recommend an additional non-contrast Chest CT ?at 18-24 months; if patient is low risk for malignancy a ?n

## 2022-04-08 NOTE — Assessment & Plan Note (Signed)
Seen on CT scan 4/23, also with severe gastroduodenitis. ?Follow GI and surgery recommendations.   ?Pt with IBS-C history.   ?No prior diagnosis of IBD (UC or Crohn's). ?

## 2022-04-08 NOTE — Assessment & Plan Note (Addendum)
See CT report from 4/23. ?--GI and surgery consulted ?--cont PPI ?

## 2022-04-08 NOTE — Assessment & Plan Note (Addendum)
K 5.4 on 4/23, given single dose Lokelma ?Resolved, K 3.8. ?Monitor BMP. ?

## 2022-04-09 ENCOUNTER — Encounter: Payer: Self-pay | Admitting: Internal Medicine

## 2022-04-09 ENCOUNTER — Inpatient Hospital Stay: Payer: Medicare HMO

## 2022-04-09 DIAGNOSIS — K5900 Constipation, unspecified: Secondary | ICD-10-CM | POA: Diagnosis not present

## 2022-04-09 DIAGNOSIS — A419 Sepsis, unspecified organism: Secondary | ICD-10-CM | POA: Diagnosis not present

## 2022-04-09 DIAGNOSIS — R1084 Generalized abdominal pain: Secondary | ICD-10-CM | POA: Diagnosis not present

## 2022-04-09 DIAGNOSIS — Z7189 Other specified counseling: Secondary | ICD-10-CM

## 2022-04-09 DIAGNOSIS — Z8719 Personal history of other diseases of the digestive system: Secondary | ICD-10-CM

## 2022-04-09 DIAGNOSIS — I4891 Unspecified atrial fibrillation: Secondary | ICD-10-CM | POA: Diagnosis not present

## 2022-04-09 DIAGNOSIS — K50019 Crohn's disease of small intestine with unspecified complications: Secondary | ICD-10-CM | POA: Diagnosis not present

## 2022-04-09 DIAGNOSIS — J9601 Acute respiratory failure with hypoxia: Secondary | ICD-10-CM | POA: Diagnosis not present

## 2022-04-09 DIAGNOSIS — Z515 Encounter for palliative care: Secondary | ICD-10-CM

## 2022-04-09 LAB — BASIC METABOLIC PANEL
Anion gap: 8 (ref 5–15)
BUN: 34 mg/dL — ABNORMAL HIGH (ref 8–23)
CO2: 34 mmol/L — ABNORMAL HIGH (ref 22–32)
Calcium: 9.3 mg/dL (ref 8.9–10.3)
Chloride: 94 mmol/L — ABNORMAL LOW (ref 98–111)
Creatinine, Ser: 0.75 mg/dL (ref 0.44–1.00)
GFR, Estimated: >60 mL/min (ref >60)
Glucose, Bld: 138 mg/dL — ABNORMAL HIGH (ref 70–99)
Potassium: 3.8 mmol/L (ref 3.5–5.1)
Sodium: 136 mmol/L (ref 135–145)

## 2022-04-09 LAB — CBC
HCT: 42.2 % (ref 36.0–46.0)
Hemoglobin: 13.1 g/dL (ref 12.0–15.0)
MCH: 26.8 pg (ref 26.0–34.0)
MCHC: 31 g/dL (ref 30.0–36.0)
MCV: 86.5 fL (ref 80.0–100.0)
Platelets: 446 10*3/uL — ABNORMAL HIGH (ref 150–400)
RBC: 4.88 MIL/uL (ref 3.87–5.11)
RDW: 16 % — ABNORMAL HIGH (ref 11.5–15.5)
WBC: 19.3 10*3/uL — ABNORMAL HIGH (ref 4.0–10.5)
nRBC: 0 % (ref 0.0–0.2)

## 2022-04-09 LAB — PROCALCITONIN: Procalcitonin: <0.1 ng/mL

## 2022-04-09 MED ORDER — FLEET ENEMA 7-19 GM/118ML RE ENEM: 1.0000 | ENEMA | Freq: Two times a day (BID) | RECTAL | Status: DC | PRN

## 2022-04-09 MED ORDER — TRAMADOL HCL 50 MG PO TABS
50.0000 mg | ORAL_TABLET | Freq: Once | ORAL | Status: AC
  Administered 2022-04-09: 50 mg via ORAL
  Filled 2022-04-09: qty 1

## 2022-04-09 NOTE — Progress Notes (Deleted)
[facility] ?SURGICAL PROGRESS NOTE (cpt [phone]) ? ?Hospital Day(s): 7.  ? ?Interval History: Patient seen and examined, no acute events or new complaints overnight. Patient reports she is still distended. No fever, chills, nausea. Her leukocytosis is improved/stable; 19.3K. Renal function remains normal; sCr - 0.75; UO - unmeasured. No significant electrolyte derangements. She is on regular diet; seems to be tolerating well. KUB pending this morning. Rectal tube not placed last night; she does have two bowel movements recorded. GI on board as well.  ? ?Review of Systems:  ?Constitutional: denies fever, chills  ?HEENT: denies cough or congestion  ?Respiratory: denies any shortness of breath  ?Cardiovascular: denies chest pain or palpitations  ?Gastrointestinal: + distension, denied N/V ?Genitourinary: denies burning with urination or urinary frequency ?Musculoskeletal: denies pain, decreased motor or sensatio ? ?Vital signs in last 24 hours: [min-max] current  ?Temp:  [97.5 ?F (36.4 ?C)-98.9 ?F (37.2 ?C)] 98.1 ?F (36.7 ?C) (04/25 0263) ?Pulse Rate:  [74-106] 74 (04/25 0742) ?Resp:  [18-24] 18 (04/25 0742) ?BP: (124-137)/(78-87) 130/81 (04/25 0742) ?SpO2:  [94 %-99 %] 96 % (04/25 0742)     Height: 4' 9.5" (146.1 cm) Weight: 53.1 kg BMI (Calculated): 24.86  ? ?Intake/Output last 2 shifts:  ?04/24 0701 - 04/25 0700 ?In: 180 [P.O.:180] ?Out: -   ? ?Physical Exam:  ?Constitutional: alert, cooperative and no distress  ?HENT: normocephalic without obvious abnormality  ?Eyes: PERRL, EOM's grossly intact and symmetric  ?Respiratory: On [place]; seems to be her baseline; conversational dyspnea ?Cardiovascular: regular rate; irregular ?Gastrointestinal: Soft, marked distension (unchanged); non-tender, no rebound/guarding. She is certainly not peritonitic ?Musculoskeletal: no edema or wounds, motor and sensation grossly intact, NT  ? ? ?Labs:  ? ?  Latest Ref Rng & Units 04/09/2022  ?  5:00 AM 04/08/2022  ?  9:01 AM  04/07/2022  ?  3:59 AM  ?CBC  ?WBC 4.0 - 10.5 K/uL 19.3   19.9   21.3    ?Hemoglobin 12.0 - 15.0 g/dL 13.1   12.9   12.5    ?Hematocrit 36.0 - 46.0 % 42.2   41.2   40.6    ?Platelets 150 - 400 K/uL 446   428   431    ? ? ?  Latest Ref Rng & Units 04/09/2022  ?  5:00 AM 04/08/2022  ?  9:01 AM 04/07/2022  ?  3:59 AM  ?CMP  ?Glucose 70 - 99 mg/dL 138   145   125    ?BUN 8 - 23 mg/dL 34   31   29    ?Creatinine 0.44 - 1.00 mg/dL 0.75   0.69   0.73    ?Sodium 135 - 145 mmol/L 136   138   141    ?Potassium 3.5 - 5.1 mmol/L 3.8   4.2   5.4    ?Chloride 98 - 111 mmol/L 94   94   97    ?CO2 22 - 32 mmol/L 34   34   37    ?Calcium 8.9 - 10.3 mg/dL 9.3   9.1   9.5    ?Total Protein 6.5 - 8.1 g/dL  6.3     ?Total Bilirubin 0.3 - 1.2 mg/dL  0.4     ?Alkaline Phos 38 - 126 U/L  63     ?AST 15 - 41 U/L  14     ?ALT 0 - 44 U/L  23     ? ? ?Imaging studies:  ? ?KUB (04/09/2022) personally  reviewed and continues to have significant colonic distension  ? ? ?Assessment/Plan: (ICD-10's: K66.7) ?86 y.o. female with persistent marked abdominal distension secondary to significant colonic and some small bowel distension concerning for possible ileus in setting of chronic medical issues (ie: PNA) vs possible Ogilvie syndrome. She does not appear constipated on imaging this morning. SBO is a possibility in setting of previous intra-abdominal surgeries, but my suspicion is low given the colonic distension and lack of N/V.  ? ? - Appreciate GI recommendation; poor candidate for procedures. I agree with rectal tube placement for decompression   ? - Again, I do not think NGT has any benefit at the moment as most of her distension is colonic. Should she develop significant N/V, we may need to consider this  ? - No surgical intervention  ?- Monitor abdominal examination; on-going bowel function ?         - Serial KUBs as needed; ? Repeat CT A/P with contrast  ?- Pain control prn (limited narcotics); antiemetics prn ?- Monitor leukocytosis; stable  ?          - Mobilization as feasible; she seems to be limited by respiratory status  ?         - Further management per primary service; we will follow    ? ?All of the above findings and recommendations were discussed with the patient, and the medical team, and all of patient's  questions were answered to her expressed satisfaction. ? ?-- ?Edison Simon, PA-C ?[facility] ?04/09/2022, 9:49 AM ?M-F: 7am - 4pm ? ?

## 2022-04-09 NOTE — Progress Notes (Signed)
[facility] ?SURGICAL PROGRESS NOTE (cpt [phone]) ? ?Hospital Day(s): 7.  ? ?Interval History: Patient seen and examined, no acute events or new complaints overnight. Patient reports she is still distended. No fever, chills, nausea. Her leukocytosis is improved/stable; 19.3K. Renal function remains normal; sCr - 0.75; UO - unmeasured. No significant electrolyte derangements. She is on regular diet; seems to be tolerating well. KUB pending this morning. Rectal tube not placed last night; she does have two bowel movements recorded. GI on board as well.  ? ?Review of Systems:  ?Constitutional: denies fever, chills  ?HEENT: denies cough or congestion  ?Respiratory: denies any shortness of breath  ?Cardiovascular: denies chest pain or palpitations  ?Gastrointestinal: + distension, denied N/V ?Genitourinary: denies burning with urination or urinary frequency ?Musculoskeletal: denies pain, decreased motor or sensatio ? ?Vital signs in last 24 hours: [min-max] current  ?Temp:  [97.5 ?F (36.4 ?C)-98.9 ?F (37.2 ?C)] 98.1 ?F (36.7 ?C) (04/25 1025) ?Pulse Rate:  [74-101] 74 (04/25 0742) ?Resp:  [18-24] 18 (04/25 0742) ?BP: (124-137)/(78-87) 130/81 (04/25 0742) ?SpO2:  [94 %-99 %] 96 % (04/25 0742)     Height: 4' 9.5" (146.1 cm) Weight: 53.1 kg BMI (Calculated): 24.86  ? ?Intake/Output last 2 shifts:  ?04/24 0701 - 04/25 0700 ?In: 180 [P.O.:180] ?Out: -   ? ?Physical Exam:  ?Constitutional: alert, cooperative and no distress  ?HENT: normocephalic without obvious abnormality  ?Eyes: PERRL, EOM's grossly intact and symmetric  ?Respiratory: On [place]; seems to be her baseline; conversational dyspnea ?Cardiovascular: regular rate; irregular ?Gastrointestinal: Soft, marked distension (unchanged); non-tender, no rebound/guarding. She is certainly not peritonitic ?Musculoskeletal: no edema or wounds, motor and sensation grossly intact, NT  ? ? ?Labs:  ? ?  Latest Ref Rng & Units 04/09/2022  ?  5:00 AM 04/08/2022  ?  9:01 AM  04/07/2022  ?  3:59 AM  ?CBC  ?WBC 4.0 - 10.5 K/uL 19.3   19.9   21.3    ?Hemoglobin 12.0 - 15.0 g/dL 13.1   12.9   12.5    ?Hematocrit 36.0 - 46.0 % 42.2   41.2   40.6    ?Platelets 150 - 400 K/uL 446   428   431    ? ? ?  Latest Ref Rng & Units 04/09/2022  ?  5:00 AM 04/08/2022  ?  9:01 AM 04/07/2022  ?  3:59 AM  ?CMP  ?Glucose 70 - 99 mg/dL 138   145   125    ?BUN 8 - 23 mg/dL 34   31   29    ?Creatinine 0.44 - 1.00 mg/dL 0.75   0.69   0.73    ?Sodium 135 - 145 mmol/L 136   138   141    ?Potassium 3.5 - 5.1 mmol/L 3.8   4.2   5.4    ?Chloride 98 - 111 mmol/L 94   94   97    ?CO2 22 - 32 mmol/L 34   34   37    ?Calcium 8.9 - 10.3 mg/dL 9.3   9.1   9.5    ?Total Protein 6.5 - 8.1 g/dL  6.3     ?Total Bilirubin 0.3 - 1.2 mg/dL  0.4     ?Alkaline Phos 38 - 126 U/L  63     ?AST 15 - 41 U/L  14     ?ALT 0 - 44 U/L  23     ? ? ?Imaging studies:  ? ?KUB (04/09/2022) personally  reviewed and continues to have significant colonic distension  ? ? ?Assessment/Plan: (ICD-10's: K10.7) ?86 y.o. female with persistent marked abdominal distension secondary to significant colonic and some small bowel distension concerning for possible ileus in setting of chronic medical issues (ie: PNA) vs possible Ogilvie syndrome. She does not appear constipated on imaging this morning. SBO is a possibility in setting of previous intra-abdominal surgeries, but my suspicion is low given the colonic distension and lack of N/V.  ? ? - Appreciate GI recommendation; poor candidate for procedures. I agree with rectal tube placement for decompression; d/w RN   ? - Again, I do not think NGT has any benefit at the moment as most of her distension is colonic. Should she develop significant N/V, we may need to consider this  ? - No surgical intervention  ?- Monitor abdominal examination; on-going bowel function ?         - Serial KUBs as needed; ? Repeat CT A/P with contrast  ?- Pain control prn (limited narcotics); antiemetics prn ?- Monitor leukocytosis;  stable  ?         - Mobilization as feasible; she seems to be limited by respiratory status  ?         - Further management per primary service; we will follow    ? ?All of the above findings and recommendations were discussed with the patient, and the medical team, and all of patient's  questions were answered to her expressed satisfaction. ? ?-- ?Edison Simon, PA-C ?[facility] ?04/09/2022, 10:42 AM ?M-F: 7am - 4pm ? ?

## 2022-04-09 NOTE — Assessment & Plan Note (Addendum)
Currently adynamic colonic ileus and painful colonic distention.   ?--GI  Consulted ?Plan: ?--cont regular bowel regimen ?

## 2022-04-09 NOTE — Progress Notes (Addendum)
Mobility Specialist - Progress Note ? ? 04/09/22 1400  ?Mobility  ?Activity Refused mobility  ? ? ? ?Pt politely declined mobility, reports back pain and shallow breathing. O2 94% on 2L, 99 HR. Sitting in recliner, requesting pain medicine. RN notified.  ? ? ?Kathee Delton ?Mobility Specialist ?04/09/22, 2:58 PM ? ? ? ? ?

## 2022-04-09 NOTE — TOC Progression Note (Signed)
Transition of Care (TOC) - Progression Note  ? ? ?Patient Details  ?Name: Kisha Messman ?MRN: [id number] ?Date of Birth: 1936-04-10 ? ?Transition of Care (TOC) CM/SW Contact  ?Laurena Slimmer, RN ?Phone Number: ?04/09/2022, 11:48 AM ? ?Clinical Narrative:    ? ?No TOC identified. Will continue to reassess.  ? ?  ?  ? ?Expected Discharge Plan and Services ?  ?  ?  ?  ?  ?                ?  ?  ?  ?  ?  ?  ?  ?  ?  ?  ? ? ?Social Determinants of Health (SDOH) Interventions ?  ? ?Readmission Risk Interventions ?   ? View : No data to display.  ?  ?  ?  ? ? ?

## 2022-04-09 NOTE — Progress Notes (Signed)
?Jackie Lame, MD [facility]   ?[address] ?[place], [place] [address] ?Phone: [phone] ?Fax : [phone] ? ? ?Subjective: ?Patient sitting up in bed today in no apparent distress.  She reports that she still has a distended abdomen and it has not improved.  The KUB from this morning showed unchanged diffuse gas distention of the small and large bowel with gas present in the rectum.  The rectal tube I ordered yesterday was not placed due to the hospitalist wanting to try enemas. ? ? ?Objective: ?Vital signs in last 24 hours: ?Vitals:  ? 04/09/22 0017 04/09/22 0500 04/09/22 0742 04/09/22 1135  ?BP: 129/78 137/81 130/81 (!) 135/94  ?Pulse: 87 96 74 97  ?Resp:  '20 18 18  '$ ?Temp: 98.9 ?F (37.2 ?C) (!) 97.5 ?F (36.4 ?C) 98.1 ?F (36.7 ?C) 98 ?F (36.7 ?C)  ?TempSrc: Oral Oral Oral Oral  ?SpO2: 95% 95% 96% 96%  ?Weight:      ?Height:      ? ?Weight change:  ? ?Intake/Output Summary (Last 24 hours) at 04/09/2022 1343 ?Last data filed at 04/09/2022 1136 ?Gross per 24 hour  ?Intake 1401.87 ml  ?Output --  ?Net 1401.87 ml  ? ? ? ?Exam: ?Heart:: Regular rate and rhythm, S1S2 present, or without murmur or extra heart sounds ?Lungs: normal and clear to auscultation and percussion ?Abdomen: Distended with decreased bowel sounds ? ? ?Lab Results: ?'@LABTEST2'$ @ ?Micro Results: ?Recent Results (from the past 240 hour(s))  ?Culture, blood (Routine x 2)     Status: None  ? Collection Time: 04/01/22  9:24 PM  ? Specimen: BLOOD  ?Result Value Ref Range Status  ? Specimen Description BLOOD LEFT FOREARM  Final  ? Special Requests   Final  ?  BOTTLES DRAWN AEROBIC AND ANAEROBIC Blood Culture adequate volume  ? Culture   Final  ?  NO GROWTH 5 DAYS ?Performed at [facility], [address] [place], [place] [address] ?  ? Report Status 04/06/2022 FINAL  Final  ?Culture, blood (Routine x 2)     Status: None  ? Collection Time: 04/01/22  9:24 PM  ? Specimen: BLOOD  ?Result Value Ref Range Status  ? Specimen Description BLOOD  RIGHT FOREARM  Final  ? Special Requests   Final  ?  BOTTLES DRAWN AEROBIC AND ANAEROBIC Blood Culture adequate volume  ? Culture   Final  ?  NO GROWTH 5 DAYS ?Performed at [facility], [address] [place], [place] [address] ?  ? Report Status 04/06/2022 FINAL  Final  ?Resp Panel by RT-PCR (Flu A&B, Covid) Nasopharyngeal Swab     Status: None  ? Collection Time: 04/01/22  9:28 PM  ? Specimen: Nasopharyngeal Swab; Nasopharyngeal(NP) swabs in vial transport medium  ?Result Value Ref Range Status  ? SARS Coronavirus 2 by RT PCR NEGATIVE NEGATIVE Final  ?  Comment: (NOTE) ?SARS-CoV-2 target nucleic acids are NOT DETECTED. ? ?The SARS-CoV-2 RNA is generally detectable in upper respiratory ?specimens during the acute phase of infection. The lowest ?concentration of SARS-CoV-2 viral copies this assay can detect is ?138 copies/mL. A negative result does not preclude SARS-Cov-2 ?infection and should not be used as the sole basis for treatment or ?other patient management decisions. A negative result may occur with  ?improper specimen collection/handling, submission of specimen other ?than nasopharyngeal swab, presence of viral mutation(s) within the ?areas targeted by this assay, and inadequate number of viral ?copies(<138 copies/mL). A negative result must be combined with ?clinical observations,  patient history, and epidemiological ?information. The expected result is Negative. ? ?Fact Sheet for Patients:  ?EntrepreneurPulse.com.au ? ?Fact Sheet for Healthcare Providers:  ?IncredibleEmployment.be ? ?This test is no t yet approved or cleared by the [place] FDA and  ?has been authorized for detection and/or diagnosis of SARS-CoV-2 by ?FDA under an Emergency Use Authorization (EUA). This EUA will remain  ?in effect (meaning this test can be used) for the duration of the ?COVID-19 declaration under Section 564(b)(1) of the Act, 21 ?U.S.C.section 360bbb-3(b)(1), unless the  authorization is terminated  ?or revoked sooner.  ? ? ?  ? Influenza A by PCR NEGATIVE NEGATIVE Final  ? Influenza B by PCR NEGATIVE NEGATIVE Final  ?  Comment: (NOTE) ?The Xpert Xpress SARS-CoV-2/FLU/RSV plus assay is intended as an aid ?in the diagnosis of influenza from Nasopharyngeal swab specimens and ?should not be used as a sole basis for treatment. Nasal washings and ?aspirates are unacceptable for Xpert Xpress SARS-CoV-2/FLU/RSV ?testing. ? ?Fact Sheet for Patients: ?EntrepreneurPulse.com.au ? ?Fact Sheet for Healthcare Providers: ?IncredibleEmployment.be ? ?This test is not yet approved or cleared by the [place] FDA and ?has been authorized for detection and/or diagnosis of SARS-CoV-2 by ?FDA under an Emergency Use Authorization (EUA). This EUA will remain ?in effect (meaning this test can be used) for the duration of the ?COVID-19 declaration under Section 564(b)(1) of the Act, 21 U.S.C. ?section 360bbb-3(b)(1), unless the authorization is terminated or ?revoked. ? ?Performed at [facility], [place], ?[place] [address] ?  ?Respiratory (~20 pathogens) panel by PCR     Status: None  ? Collection Time: 04/01/22  9:28 PM  ? Specimen: Nasopharyngeal Swab; Respiratory  ?Result Value Ref Range Status  ? Adenovirus NOT DETECTED NOT DETECTED Final  ? Coronavirus 229E NOT DETECTED NOT DETECTED Final  ?  Comment: (NOTE) ?The Coronavirus on the Respiratory Panel, DOES NOT test for the novel  ?Coronavirus (2019 nCoV) ?  ? Coronavirus HKU1 NOT DETECTED NOT DETECTED Final  ? Coronavirus NL63 NOT DETECTED NOT DETECTED Final  ? Coronavirus OC43 NOT DETECTED NOT DETECTED Final  ? Metapneumovirus NOT DETECTED NOT DETECTED Final  ? Rhinovirus / Enterovirus NOT DETECTED NOT DETECTED Final  ? Influenza A NOT DETECTED NOT DETECTED Final  ? Influenza B NOT DETECTED NOT DETECTED Final  ? Parainfluenza Virus 1 NOT DETECTED NOT DETECTED Final  ? Parainfluenza Virus  2 NOT DETECTED NOT DETECTED Final  ? Parainfluenza Virus 3 NOT DETECTED NOT DETECTED Final  ? Parainfluenza Virus 4 NOT DETECTED NOT DETECTED Final  ? Respiratory Syncytial Virus NOT DETECTED NOT DETECTED Final  ? Bordetella pertussis NOT DETECTED NOT DETECTED Final  ? Bordetella Parapertussis NOT DETECTED NOT DETECTED Final  ? Chlamydophila pneumoniae NOT DETECTED NOT DETECTED Final  ? Mycoplasma pneumoniae NOT DETECTED NOT DETECTED Final  ?  Comment: Performed at [facility], [place] [address], [place], [place] [address]  ? ?Studies/Results: ?CT ABDOMEN PELVIS WO CONTRAST ? ?Result Date: 04/07/2022 ?CLINICAL DATA:  Bowel obstruction suspected.  Abdominal pain. EXAM: CT ABDOMEN AND PELVIS WITHOUT CONTRAST TECHNIQUE: Multidetector CT imaging of the abdomen and pelvis was performed following the standard protocol without IV contrast. RADIATION DOSE REDUCTION: This exam was performed according to the departmental dose-optimization program which includes automated exposure control, adjustment of the mA and/or kV according to patient size and/or use of iterative reconstruction technique. COMPARISON:  Portable chest today, portable chest 06/28/2021, CTs of the abdomen and pelvis with IV contrast dated 09/15/2020  and 04/16/2017. FINDINGS: Lower chest: Chronic pronounced elevation of the left hemidiaphragm is again noted consistent with paresis, eventration or hernia. Approximally the upper 2/3 of the stomach remain intrathoracic as well as the colonic splenic flexure and spleen. Small layering left and trace right pleural effusions are present, not seen previously. Left lung base above the elevated hemidiaphragm was not included in the field. On series 3, image 3 there is a 6 mm noncalcified pleural-based nodule in the anterolateral right lower lobe base. There is mild chronic pleural-parenchymal disease in the lateral extreme right lung base. The heart enlarged but unchanged. The intraventricular blood pool is low in  density consistent with anemia. Hepatobiliary: The liver is normal in size and unremarkable without contrast. Gallbladder and bile ducts are unremarkable. Pancreas: No focal pancreatic abnormality is s

## 2022-04-09 NOTE — Assessment & Plan Note (Addendum)
--  CT a/p on 4/23 mentioned moderate retained stool in the cecum, however, subsequent KUB showed no finding to suggest significant stool burden ?Plan: ?--cont regular bowel regimen ?

## 2022-04-09 NOTE — Progress Notes (Addendum)
?Progress Note ? ? ?Patient: Altheria Shadoan VHQ:[id number] DOB: 12-22-1935 DOA: 04/01/2022     7 ?DOS: the patient was seen and examined on 04/09/2022 ?  ?Brief hospital course: ?Duana Rozann Holts is a 86 y.o. female with medical history significant for Bronchiectasis, COPD, chronic hypoxic and hypercapnic respiratory failure on home 2L O2, atrial fibrillation, no longer on Eliquis due to GI bleed and chronic iron deficiency anemia, who presented to the ED with shortness of breath and reports O2 sat of 85% on 4 L at home.  Pt stated feeling generally unwell for a while. ?  ?On arrival in rapid A-fib at 129, tachypneic to 29 with temperature 101.  O2 sat 94% on 4 L.  VBG with pH 7.4, PCO2 62, which is about baseline.  COVID and flu negative.  Chest x-ray with developing infiltrate in the right lung base.  Patient started on Levaquin, DuoNeb and Solu-Medrol.  Also started on a Cardizem infusion for A-fib with RVR.  ? ?Clinically improving. ? ?4/19: Still very dyspneic with minimal exertion. ?4/20: HRs still 110's at rest, increasing rate control ?4/21: severe abdominal pain 2/2 constipation, HR's uncontrolled ?4/22: abdomen improved, having BM's, HR's better but remain elevated in 110's at rest, adjust rate control ?4/23-24: persistent abdominal distention, constipation despite aggressive bowel regimen.  G/S and GI consulted given CT findings of gastroduodenitis and terminal ileitis. ? ?Assessment and Plan: ?* Sepsis ([place]) ?Febrile, tachycardic and tachypneic with leukocytosis.  Lactic acid normal ?Sepsis fluids and treat pneumonia as outlined.  Sepsis physiology improved, tachycardia and persistent due to A-fib RVR. ? ?Right lower lobe pneumonia ?Completed course of IV Levaquin.  ?Incentive spirometry. ?DuoNebs as needed and antitussives. ?Supplement O2 per protocol. ? ?Acute respiratory failure with hypoxia and hypercapnia (HCC) ?Possibly multifactorial related to pneumonia, COPD, bronchiectasis ?Patient  requiring 4 L above her baseline of 2 secondary to pneumonia ?Continue supplemental O2 to keep sats over 92% ? ?Constipation ?Severe currently, with abdominal pain and distention as outlined, inflammatory findings on CT.  Hx of IBS-C ?--stop lactulose - has not helped ?--resume BID Fleet enemas ?--bowel regimen per orders ?--GI following, suggests rectal tube to attempt decompression.  Today, pt declined tube for now, wishing to attempt additional enemas first ? ?History of IBS ?Currently with severe constipation and painful colonic distention.   ?--continue bowel regimen, enemas ?--consider Linzess once this acute episode resolves ?--GI following ? ?Pulmonary nodule ?New 6 mm noncalcified pleural-bases RLL nodule noted on CT scan 4/23.  See report.   ?Per radiology: "Recommend a non-contrast Chest CT at 6-12 months. If patient is high risk for malignancy, recommend an additional non-contrast Chest CT at 18-24 months; if patient is low risk for malignancy a non-contrast Chest CT at 18-24 months is optional. These guidelines do not apply to immunocompromised patients and patients with cancer.  Follow up in patients with significant comorbidities as clinically warranted." ? ?Terminal ileitis ([place]) ?Seen on CT scan 4/23, also with severe gastroduodenitis. ?Follow GI and surgery recommendations.   ?Pt with IBS-C history.   ?No prior diagnosis of IBD (UC or Crohn's). ? ?Gastroduodenitis ?See CT report from 4/23. ?--GI and surgery consulted ?--started IV PPI BID for now ? ?Hyperkalemia ?K 5.4 on 4/23, given single dose Lokelma ?Resolved, K 3.8. ?Monitor BMP. ? ?Compression fracture of T10 vertebra ([place]) ?Chronic, stable.  Lidocaine patch and p.o. analgesics as needed for pain control. ? ?Abdominal pain ?On 4/21, due to severe constipation. ?Abdominal xray showed large stool burden and marked  gaseous distention of the stomach.   ?Improved with aggressive bowel regimen, but only small BM.  Now recurrent / persistent. ?CT  abd/pelvis on 4/23 with findings of severe gastritis/duodenitis, ileitis. ?--General surgery and GI consulted - follow up recommendations ?--simethicone PRN gas pains ?--bowel regimen per orders, hold once loose or frequent stools ?--monitor abdominal exam closely ? ?COPD (chronic obstructive pulmonary disease) ([place]) ?Not acutely exacerbated ?DuoNebs as needed.  ?Resume  formulary equivalents for Trelegy ? ?Atrial fibrillation with RVR ([place]) ?RVR being driven by acute illness, initially PNA and respiratory symptoms, now persistent in setting of GI issues. ?4/21: HR's remain uncontrolled, in the setting of severe abdominal pain ?4/22: Cardizem drip had run out early a.m., HR was 100s to 120s at rest ?4/23: resumed amiodarone drip ?4/24-25: improved on amio gtt, resting HR in low 90's-100's  ?--continue amiodarone gtt for now ?--Start on Cardizem CD 360 mg daily ?--Stop Cardizem drip ?--IV Lopressor PRN for HR sustaining above 110 at rest ?--Hope to avoid beta-blockers given underlying COPD ?--Telemetry monitoring ?--Cardiology follow up after d/c versus consult if not improved ? ? ?Bronchiectasis ([place]) ?Possible bronchiectasis flare due to PNA. ?DuoNebs as Q6H and PRN. ?Transitioned IV Solu-medrol to Prednisone. ?Completed prednisone on 4/22. ? ? ? ? ?  ? ?Subjective: Patient awake up in recliner when seen today.  She has ongoing abdominal pain and distention, only couple very small BMs.  She request to resume enemas which she has often needed to use at home.  Reports severe episodes of constipation but without distention this bad or painful.  She would have to use multiple enemas to improve.  Not tolerating any food, trying to sip liquids but does not feel like consuming anything.  We will ask dietitian to see ? ?Physical Exam: ?Vitals:  ? 04/09/22 1135 04/09/22 1200 04/09/22 1300 04/09/22 1500  ?BP: (!) 135/94 135/79  120/89  ?Pulse: 97  87 95  ?Resp: 18   18  ?Temp: 98 ?F (36.7 ?C)   98 ?F (36.7 ?C)  ?TempSrc:  Oral   Oral  ?SpO2: 96%  94% 94%  ?Weight:      ?Height:      ? ?General exam: awake, alert, no acute distress, mildly ill-appearing ?HEENT: moist mucus membranes, hearing grossly normal  ?Respiratory system: CTAB diminished bases, n mildly increased respiratory effort. ?Cardiovascular system: normal C5/Y8, RRR, systolic murmur noted, trace lower extremity edema.   ?Gastrointestinal system: Persistently distended, tender but without peritoneal signs, can appreciate some bowel sounds today. ?Central nervous system: A&O x4. no gross focal neurologic deficits, normal speech ?Extremities: moves all, no cyanosis, normal tone ?Skin: dry, intact, normal temperature ?Psychiatry: normal mood, congruent affect, judgement and insight appear normal ? ? ? ?Data Reviewed: ? ?Notable labs: Chloride 94, CO2 34, glucose 138, BUN 34, procalcitonin negative, WBC 19.3, platelets 446 ? ?Family Communication: Spoke with Kerin Perna (close family friend who assist patient with her medications and appointments) yesterday afternoon, will attempt to call today as time allows ? ?Disposition: ?Status is: Inpatient ?Remains inpatient appropriate because: Persistent abdominal pain and distention without bowel movements, uncontrolled heart rate remaining on IV amiodarone ? ? Planned Discharge Destination: Home ? ? ? ?Time spent: 45 minutes ? ?Author: ?Ezekiel Slocumb, DO ?04/09/2022 6:31 PM ? ?For on call review [email or web address].  ?

## 2022-04-09 NOTE — Consult Note (Signed)
? ?                                                                                ?Consultation Note ?Date: 04/09/2022  ? ?Patient Name: Jackie Turner  ?DOB: [birth year]  MRN: [id number]  Age / Sex: 86 y.o., female  ?PCP: Virginia Crews, MD ?Referring Physician: Ezekiel Slocumb, DO ? ?Reason for Consultation: Establishing goals of care ? ?HPI/Patient Profile: 86 y.o. female  with past medical history of bronchiectasis, pulmonary hypertension, chronic hypoxic and hypercapnic respiratory failure on 2 L oxygen at home, A-fib no longer on Eliquis due to GI bleed and chronic iron deficiency anemia, HLD, osteoporosis, abdominal hysterectomy in 2003, admitted on 04/01/2022 with sepsis/right lower lobe pneumonia/acute on chronic respiratory failure, new 6 mm noncalcified right lower lobe nodule, terminal ileitis/gastroduodenitis assisted by GI and surgery.  ? ?Clinical Assessment and Goals of Care: ?I have reviewed medical records including EPIC notes, labs and imaging, received report from RN, assessed the patient.  Jackie Turner is sitting up in the [place] chair in her room.  She greets me, making and mostly keeping eye contact.  She appears acutely ill, but overall capable of meeting her own needs.  She is alert and oriented x3, able to make her basic needs known.  There is no family at bedside at this time.  Bedside nursing staff is present assisting with medications. ? ?We meet at the bedside to discuss diagnosis prognosis, GOC, EOL wishes, disposition and options.  I introduced Palliative Medicine as specialized medical care for people living with serious illness. It focuses on providing relief from the symptoms and stress of a serious illness. The goal is to improve quality of life for both the patient and the family. ? ?We discussed a brief life review of the patient.  Jackie Turner was married, but her husband is deceased.  They had no children.  She has a sister, Harmon Pier, who lives nearby.  She tells me that  her mother lived to be 3 months shy of 54 years old. ? ?We then focused on their current illness.  We talk about her pneumonia and the treatment plan, her irregular heart rate and the treatment plan.  We also talk about her ileus and the treatment plan.  She understands that she is not a candidate for any surgical interventions.  She shares that she has had irritable bowel syndrome for 40 years.  The natural disease trajectory and expectations at EOL were discussed. ? ?Advanced directives, concepts specific to code status, artifical feeding and hydration, and rehospitalization were considered and discussed.  We talked about the concept of "treat the treatable, but allowing natural death".  Jackie Turner states she has not considered these choices and will do so. ? ?Palliative Care services outpatient were explained and offered.  We talked about the benefits of continued goals of care discussions, talking about the "what if's and maybe's".   ? ?Discussed the importance of continued conversation with family and the medical providers regarding overall plan of care and treatment options, ensuring decisions are within the context of the patient?s values and GOCs. Questions and concerns were addressed.  The family was encouraged to  call with questions or concerns.  PMT will continue to support holistically. ? ?Conference with attending, bedside nursing staff, transition of care team related to patient condition, needs, goals of care, disposition. ? ? ?HCPOA  ?NEXT OF KIN -we talk about HCPOA choices.  Jackie Turner names her sister, [name] as her healthcare surrogate.  [name], listed as granddaughter is not legal granddaughter, but a neighbor. ?  ? ?SUMMARY OF RECOMMENDATIONS   ?At this point continue to treat the treatable ?Time for outcomes ?Considering CODE STATUS ?Considering outpatient palliative services ? ? ? ?Code Status/Advance Care Planning: ?Full code -considering CODE STATUS.  PMT to follow ? ?Symptom  Management:  ?Per hospitalist, no additional needs at this time. ? ?Palliative Prophylaxis:  ?Frequent Pain Assessment and Oral Care ? ?Additional Recommendations (Limitations, Scope, Preferences): ?Full Scope Treatment ? ?Psycho-social/Spiritual:  ?Desire for further Chaplaincy support:no ?Additional Recommendations: Caregiving  Support/Resources ? ?Prognosis:  ?Unable to determine, based on outcomes.  Guarded at this point. ? ?Discharge Planning:  To be determined, based on outcomes.  Anticipate home with home health if qualified, would benefit from outpatient palliative services   ? ?  ? ?Primary Diagnoses: ?Present on Admission: ? Sepsis ([place]) ? Acute respiratory failure with hypoxia and hypercapnia (HCC) ? Atrial fibrillation with RVR ([place]) ? COPD (chronic obstructive pulmonary disease) ([place]) ? Right lower lobe pneumonia ? Compression fracture of T10 vertebra (HCC) ? ? ?I have reviewed the medical record, interviewed the patient and family, and examined the patient. The following aspects are pertinent. ? ?Past Medical History:  ?Diagnosis Date  ? Allergy   ? Asthma   ? Hyperlipidemia   ? Osteoporosis   ? ?Social History  ? ?Socioeconomic History  ? Marital status: Widowed  ?  Spouse name: Jackie Turner  ? Number of children: 0  ? Years of education: H/S  ? Highest education level: 12th grade  ?Occupational History  ? Occupation: Retired  ?Tobacco Use  ? Smoking status: Never  ? Smokeless tobacco: Never  ?Vaping Use  ? Vaping Use: Never used  ?Substance and Sexual Activity  ? Alcohol use: No  ? Drug use: No  ? Sexual activity: Never  ?Other Topics Concern  ? Not on file  ?Social History Narrative  ? Not on file  ? ?Social Determinants of Health  ? ?Financial Resource Strain: Low Risk   ? Difficulty of Paying Living Expenses: Not hard at all  ?Food Insecurity: No Food Insecurity  ? Worried About Charity fundraiser in the Last Year: Never true  ? Ran Out of Food in the Last Year: Never true  ?Transportation Needs: No  Transportation Needs  ? Lack of Transportation (Medical): No  ? Lack of Transportation (Non-Medical): No  ?Physical Activity: Inactive  ? Days of Exercise per Week: 0 days  ? Minutes of Exercise per Session: 0 min  ?Stress: No Stress Concern Present  ? Feeling of Stress : Not at all  ?Social Connections: Unknown  ? Frequency of Communication with Friends and Family: Not on file  ? Frequency of Social Gatherings with Friends and Family: Once a week  ? Attends Religious Services: Never  ? Active Member of Clubs or Organizations: No  ? Attends Archivist Meetings: Never  ? Marital Status: Widowed  ? ?Family History  ?Problem Relation Age of Onset  ? Heart disease Mother   ? CAD Mother   ? Congestive Heart Failure Mother   ? Osteoarthritis Mother   ? Cancer  Sister   ?     breast  ? Breast cancer Sister   ? ?Scheduled Meds: ? diltiazem  360 mg Oral Daily  ? doxepin  30 mg Oral QHS  ? enoxaparin (LOVENOX) injection  40 mg Subcutaneous Q24H  ? feeding supplement  237 mL Oral TID BM  ? fluticasone furoate-vilanterol  1 puff Inhalation Daily  ? lactulose  20 g Oral TID  ? lidocaine  1 patch Transdermal Q24H  ? multivitamin with minerals  1 tablet Oral Daily  ? pantoprazole (PROTONIX) IV  40 mg Intravenous Q12H  ? polyethylene glycol  17 g Oral Daily  ? senna-docusate  1 tablet Oral BID  ? ?Continuous Infusions: ? amiodarone 30 mg/hr (04/09/22 1224)  ? ?PRN Meds:.acetaminophen, albuterol, bisacodyl, bisacodyl, metoprolol tartrate, mineral oil, ondansetron (ZOFRAN) IV, simethicone, sodium phosphate ?Medications Prior to Admission:  ?Prior to Admission medications   ?Medication Sig Start Date End Date Taking? Authorizing Provider  ?albuterol (PROVENTIL) (2.5 MG/3ML) 0.083% nebulizer solution USE 3 ML BY NEBULIZATION EVERY 6 HOURS AS NEEDED FOR WHEEZING OR SHORTNESS OF BREATH 02/07/22  Yes Tyler Pita, MD  ?Calcium Carb-Cholecalciferol 600-400 MG-UNIT TABS Take 2 tablets by mouth 2 (two) times daily.   Yes  [provider]  ?dicyclomine (BENTYL) 10 MG capsule TAKE 1 CAPSULE BY MOUTH 3 TIMES DAILY ASNEEDED FOR SPASMS 02/07/22  Yes Bacigalupo, Dionne Bucy, MD  ?doxepin (SINEQUAN) 10 MG capsule TAKE 3 CAPSULES BY

## 2022-04-09 NOTE — Plan of Care (Signed)

## 2022-04-09 NOTE — Progress Notes (Signed)
Per Dr. Arbutus Ped, to hold off placing rectal tube. Fleet enema prn ordered. Patient stated she will let nurse know when she wants enema given. Currently passing lots of gas. ?

## 2022-04-09 NOTE — Progress Notes (Signed)
Reminded patient about enema order. She does not want to get enema at this time. Will continue to monitor the need for enema. ?

## 2022-04-10 ENCOUNTER — Inpatient Hospital Stay: Payer: Medicare HMO

## 2022-04-10 DIAGNOSIS — Z515 Encounter for palliative care: Secondary | ICD-10-CM | POA: Diagnosis not present

## 2022-04-10 DIAGNOSIS — K567 Ileus, unspecified: Secondary | ICD-10-CM | POA: Diagnosis not present

## 2022-04-10 DIAGNOSIS — K50019 Crohn's disease of small intestine with unspecified complications: Secondary | ICD-10-CM | POA: Diagnosis not present

## 2022-04-10 DIAGNOSIS — Z7189 Other specified counseling: Secondary | ICD-10-CM | POA: Diagnosis not present

## 2022-04-10 DIAGNOSIS — A419 Sepsis, unspecified organism: Secondary | ICD-10-CM | POA: Diagnosis not present

## 2022-04-10 LAB — BASIC METABOLIC PANEL
Anion gap: 11 (ref 5–15)
BUN: 28 mg/dL — ABNORMAL HIGH (ref 8–23)
CO2: 34 mmol/L — ABNORMAL HIGH (ref 22–32)
Calcium: 9 mg/dL (ref 8.9–10.3)
Chloride: 90 mmol/L — ABNORMAL LOW (ref 98–111)
Creatinine, Ser: 0.68 mg/dL (ref 0.44–1.00)
GFR, Estimated: >60 mL/min (ref >60)
Glucose, Bld: 115 mg/dL — ABNORMAL HIGH (ref 70–99)
Potassium: 3.6 mmol/L (ref 3.5–5.1)
Sodium: 135 mmol/L (ref 135–145)

## 2022-04-10 LAB — CBC
HCT: 37.6 % (ref 36.0–46.0)
Hemoglobin: 12.1 g/dL (ref 12.0–15.0)
MCH: 27.5 pg (ref 26.0–34.0)
MCHC: 32.2 g/dL (ref 30.0–36.0)
MCV: 85.5 fL (ref 80.0–100.0)
Platelets: 387 10*3/uL (ref 150–400)
RBC: 4.4 MIL/uL (ref 3.87–5.11)
RDW: 15.9 % — ABNORMAL HIGH (ref 11.5–15.5)
WBC: 16.5 10*3/uL — ABNORMAL HIGH (ref 4.0–10.5)
nRBC: 0 % (ref 0.0–0.2)

## 2022-04-10 LAB — PROCALCITONIN: Procalcitonin: <0.1 ng/mL

## 2022-04-10 MED ORDER — PANTOPRAZOLE SODIUM 40 MG PO TBEC
40.0000 mg | DELAYED_RELEASE_TABLET | Freq: Two times a day (BID) | ORAL | Status: DC
Start: 2022-04-10 — End: 2022-04-12
  Administered 2022-04-10 – 2022-04-12 (×4): 40 mg via ORAL
  Filled 2022-04-10 (×4): qty 1

## 2022-04-10 MED ORDER — BOOST / RESOURCE BREEZE PO LIQD CUSTOM
1.0000 | Freq: Three times a day (TID) | ORAL | Status: DC
Start: 2022-04-10 — End: 2022-04-11
  Administered 2022-04-10 – 2022-04-11 (×2): 1 via ORAL

## 2022-04-10 MED ORDER — PROSOURCE PLUS PO LIQD
30.0000 mL | Freq: Three times a day (TID) | ORAL | Status: DC
  Administered 2022-04-11 (×2): 30 mL via ORAL
  Filled 2022-04-10 (×8): qty 30

## 2022-04-10 NOTE — Progress Notes (Signed)
Palliative:  ?Jackie Turner is sitting up in the [place] chair in her room.  She greets me, making and somewhat keeping eye contact.  She appears acutely/chronically ill and somewhat tired.  She is alert and oriented, able to make her basic needs known.  There is no family at bedside at this time.  She tells me that she has not been sleeping well, and is woken several times through the night. ? ?We talk about her abdomen x-ray this morning, unchanged.  We talk about GI/surgery recommendations for rectal tube.  Jackie Turner states that she was passing more gas yesterday, but feels that this has lessened today.  I encouraged mobility.  She talks about her 40-year history of irritable bowel disease. ? ?We talked about CODE STATUS, "treat the treatable, but allowing natural passing".  Jackie Turner endorses DNR.  Orders updated, goldenrod form completed and placed on chart. ? ?Jackie Turner shares her concerns about what is next.  She shares that she is deciding whether she will be able to return home or go to her sister's home to recover.  I shared that she has a ways to go.  She shares her concerns about her responsibilities in her own home such as paperwork/bills.  Reassurance offered.  She shares that her niece is coming from [place] over the weekend and expects that she will be able to assist. ? ?Conference with attending, bedside nursing staff, transition of care team related to patient condition, needs, goals of care, disposition. ? ?Plan: At this point continue to treat the treatable but no CPR or intubation.  Time for outcomes.  Condition is guarded at this point. ? ?50 minutes  ?Jackie Axe, NP ?Palliative Medicine Team  ?Team Phone [phone] ?Greater than 50% of this time was spent counseling and coordinating care related to the above assessment and plan.  ?

## 2022-04-10 NOTE — Progress Notes (Signed)
?Progress Note ? ? ?Patient: Jackie Turner OIB:[id number] DOB: Jul 12, 1936 DOA: 04/01/2022     9 ?DOS: the patient was seen and examined on 04/11/2022 ?  ?Brief hospital course: ?Jackie Turner is a 86 y.o. female with medical history significant for Bronchiectasis, COPD, chronic hypoxic and hypercapnic respiratory failure on home 2L O2, atrial fibrillation, no longer on Eliquis due to GI bleed and chronic iron deficiency anemia, who presented to the ED with shortness of breath and reports O2 sat of 85% on 4 L at home.  Pt stated feeling generally unwell for a while. ?  ?On arrival in rapid A-fib at 129, tachypneic to 29 with temperature 101.  O2 sat 94% on 4 L.  VBG with pH 7.4, PCO2 62, which is about baseline.  COVID and flu negative.  Chest x-ray with developing infiltrate in the right lung base.  Patient started on Levaquin, DuoNeb and Solu-Medrol.  Also started on a Cardizem infusion for A-fib with RVR.  ? ?Clinically improving. ? ?4/19: Still very dyspneic with minimal exertion. ?4/20: HRs still 110's at rest, increasing rate control ?4/21: severe abdominal pain 2/2 constipation, HR's uncontrolled ?4/22: abdomen improved, having BM's, HR's better but remain elevated in 110's at rest, adjust rate control ?4/23-24: persistent abdominal distention, constipation despite aggressive bowel regimen.  G/S and GI consulted given CT findings of gastroduodenitis and terminal ileitis. ? ?Assessment and Plan: ?* Sepsis ([place]) ?Febrile, tachycardic and tachypneic with leukocytosis.  Lactic acid normal ?Sepsis fluids and treat pneumonia as outlined.  Sepsis physiology improved, tachycardia and persistent due to A-fib RVR. ? ?Right lower lobe pneumonia ?Completed course of IV Levaquin.  ?Incentive spirometry. ?DuoNebs as needed and antitussives. ?Supplement O2 per protocol. ? ?Acute respiratory failure with hypoxia and hypercapnia (HCC) ?Possibly multifactorial related to pneumonia, COPD, bronchiectasis ?Patient  requiring 4 L above her baseline of 2 secondary to pneumonia ?Continue supplemental O2 to keep sats over 92% ? ?Ogilvie syndrome ?--Less concern for SBO given mostly colonic distention and lack of N/V. ?Plan: ?--place rectal tube for decompression today, per GI rec ?--d/c enema as imaging didn't show stool burden ? ?Constipation ?--CT a/p on 4/23 mentioned moderate retained stool in the cecum, however, subsequent KUB showed no finding to suggest significant stool burden ?Plan: ?--cont regular bowel regimen ?--d/c scheduled enema ? ?History of IBS ?Currently adynamic colonic ileus and painful colonic distention.   ?--GI  Consulted ?Plan: ?--cont regular bowel regimen ?--insert rectal tube today for decompression ? ?Pulmonary nodule ?New 6 mm noncalcified pleural-bases RLL nodule noted on CT scan 4/23.  See report.   ?Per radiology: "Recommend a non-contrast Chest CT at 6-12 months. If patient is high risk for malignancy, recommend an additional non-contrast Chest CT at 18-24 months; if patient is low risk for malignancy a non-contrast Chest CT at 18-24 months is optional. These guidelines do not apply to immunocompromised patients and patients with cancer.  Follow up in patients with significant comorbidities as clinically warranted." ? ?Terminal ileitis ([place]) ?Seen on CT scan 4/23, also with severe gastroduodenitis. ?Follow GI and surgery recommendations.   ?Pt with IBS-C history.   ?No prior diagnosis of IBD (UC or Crohn's). ? ?Gastroduodenitis ?See CT report from 4/23. ?--GI and surgery consulted ?--cont PPI ? ?Hyperkalemia ?K 5.4 on 4/23, given single dose Lokelma ?Resolved, K 3.8. ?Monitor BMP. ? ?Compression fracture of T10 vertebra ([place]) ?Chronic, stable.  Lidocaine patch and p.o. analgesics as needed for pain control. ? ?Abdominal pain ?On 4/21, due to severe constipation. ?  Abdominal xray showed large stool burden and marked gaseous distention of the stomach.   ?Improved with aggressive bowel regimen ?CT  abd/pelvis on 4/23 with findings of severe gastritis/duodenitis, ileitis. ?--General surgery and GI consulted, dx with Ogilvie syndrome ?Plan: ?--insert rectal tube for decompression today ? ?COPD (chronic obstructive pulmonary disease) ([place]) ?Not acutely exacerbated ?DuoNebs as needed.  ?Cont formulary equivalents for Trelegy ? ?Atrial fibrillation with RVR ([place]) ?RVR being driven by acute illness, initially PNA and respiratory symptoms, now persistent in setting of GI issues. ?--started on amio bolus f/b gtt on 4/23.  Appeared rate controlled now. ?Plan: ?--d/c amio gtt ?--cont cardizem 360 mg daily ? ?Bronchiectasis ([place]) ?Possible bronchiectasis flare due to PNA. ?DuoNebs as Q6H and PRN. ?Transitioned IV Solu-medrol to Prednisone. ?Completed prednisone on 4/22. ? ? ? ? ?  ? ?Subjective:  ?Pt agreed to rectal tube today for decompression.  Some improvement in abdominal distention reported. ? ? ?Physical Exam: ? ?Constitutional: NAD, AAOx3 ?HEENT: conjunctivae and lids normal, EOMI ?CV: No cyanosis.   ?RESP: normal respiratory effort ?Extremities: No effusions, edema in BLE ?SKIN: warm, dry ?Neuro: II - XII grossly intact.   ?Rectal tube in ? ? ?Data Reviewed: ? ?Family Communication:  ? ?Disposition: ?Status is: Inpatient ? ? Planned Discharge Destination: Home ? ? ? ?Time spent: 50 minutes ? ?Author: ?Enzo Bi, MD ?04/11/2022 2:48 AM ? ?For on call review [email or web address].  ?

## 2022-04-10 NOTE — Plan of Care (Signed)
  Problem: Health Behavior/Discharge Planning: Goal: Ability to manage health-related needs will improve Outcome: Progressing   Problem: Clinical Measurements: Goal: Respiratory complications will improve Outcome: Progressing   Problem: Clinical Measurements: Goal: Cardiovascular complication will be avoided Outcome: Progressing   Problem: Activity: Goal: Risk for activity intolerance will decrease Outcome: Progressing   Problem: Elimination: Goal: Will not experience complications related to bowel motility Outcome: Progressing   Problem: Elimination: Goal: Will not experience complications related to urinary retention Outcome: Progressing   Problem: Pain Managment: Goal: General experience of comfort will improve Outcome: Progressing   Problem: Safety: Goal: Ability to remain free from injury will improve Outcome: Progressing   

## 2022-04-10 NOTE — Progress Notes (Signed)
Nutrition Follow-up ? ?DOCUMENTATION CODES:  ? ?Not applicable ? ?INTERVENTION:  ? ?-D/c Ensure Enlive po TID, each supplement provides 350 kcal and 20 grams of protein ?-Continue MVI with minerals daily ?-Boost Breeze po TID, each supplement provides 250 kcal and 9 grams of protein  ?-30 ml Prosource Plus TID, each supplement 100 kcals and 15 grams protein  ? ?NUTRITION DIAGNOSIS:  ? ?Increased nutrient needs related to chronic illness (bronchiectasis) as evidenced by estimated needs. ? ?Ongoing ? ?GOAL:  ? ?Patient will meet greater than or equal to 90% of their needs ? ?Progressing  ? ?MONITOR:  ? ?PO intake, Supplement acceptance, Labs, Weight trends, Skin, I & O's ? ?REASON FOR ASSESSMENT:  ? ?Consult ?Assessment of nutrition requirement/status ? ?ASSESSMENT:  ? ?Jackie Turner is a 86 y.o. female with medical history significant for Bronchiectasis, pulmonary hypertension, chronic hypoxic and hypercapnic respiratory failure on home O2 at 2 L, atrial fibrillation, no longer on Eliquis due to GI bleed and chronic iron deficiency anemia, who presents to the ED with shortness of breath and reports O2 sat of 85% on 4 L at home and speaking in short sentences.  States she has been having a productive cough for the past several days that has been worsening. ? ?4/24- s/p KUB revealed diffuse gas distention of the small and large bowel with gas present in the rectum ?4/25- KUB unchanged ? ?Reviewed I/O's: +1.3 L x 24 hours and +5.2 L since admission  ? ?Per GI notes, pt with ogilvie syndrome. Rectal tube was recommended, but MD would like to try enemas first. GI reports pt is not a good surgical candidate. Palliative care following for goals of care.  ? ?Pt reports feeling poorly today and complains of abdominal distention, which has improved today. Pt complains that she is unable to eat much, as her stomach feels full. Per pt, she consumed some broth this morning, which soothed her stomach. Pt also consumed  about half cup of jello. Noted meal completions 50-100%, but suspect this is inaccurate. Pt is not drinking Ensure supplements, as she suspects this will be "too hard on my stomach".  ? ?Pt hesitant for diet advancement; RD discussed that clear liquids alone is not sustainable long term- she is willing to revisit diet advancement tomorrow if symptoms continue to improve. Pt also willing to try Boost Breeze and Prosource.  ? ?Medications reviewed and include cardizem, miralax, senokot, and amiodarone.  ? ?Labs reviewed: CBGS: 99.  ? ?Diet Order:   ?Diet Order   ? ?       ?  Diet clear liquid Room service appropriate? Yes; Fluid consistency: Thin  Diet effective now       ?  ? ?  ?  ? ?  ? ? ?EDUCATION NEEDS:  ? ?Education needs have been addressed ? ?Skin:  Skin Assessment: Reviewed RN Assessment ? ?Last BM:  04/09/22 ? ?Height:  ? ?Ht Readings from Last 1 Encounters:  ?04/01/22 4' 9.5" (1.461 m)  ? ? ?Weight:  ? ?Wt Readings from Last 1 Encounters:  ?04/06/22 53.1 kg  ? ? ?Ideal Body Weight:  43.6 kg ? ?BMI:  Body mass index is 24.88 kg/m?. ? ?Estimated Nutritional Needs:  ? ?Kcal:  1650-1850 ? ?Protein:  80-95 grams ? ?Fluid:  > 1.6 L ? ? ? ?Jackie Turner, RD, LDN, CDCES ?Registered Dietitian II ?Certified Diabetes Care and Education Specialist ?Please refer to [facility] for RD and/or RD on-call/weekend/after hours pager  ?

## 2022-04-10 NOTE — Progress Notes (Signed)
PHARMACIST - PHYSICIAN COMMUNICATION ? ?CONCERNING: IV to Oral Route Change Policy ? ?RECOMMENDATION: ?This patient is receiving pantoprazole by the intravenous route.  Based on criteria approved by the Pharmacy and Therapeutics Committee, the intravenous medication(s) is/are being converted to the equivalent oral dose form(s). ? ? ?DESCRIPTION: ?These criteria include: ?The patient is eating (either orally or via tube) and/or has been taking other orally administered medications for a least 24 hours ?The patient has no evidence of active gastrointestinal bleeding or impaired GI absorption (gastrectomy, short bowel, patient on TNA or NPO). ? ?If you have questions about this conversion, please contact the Pharmacy Department  ? ?Benita Gutter, RPH ?04/10/2022 1:41 PM  ?

## 2022-04-10 NOTE — Progress Notes (Signed)
[facility] ?SURGICAL PROGRESS NOTE (cpt [phone]) ? ?Hospital Day(s): 8.  ? ?Interval History: Patient seen and examined, no acute events or new complaints overnight. Patient reports she is feeling much better; less distended. No fever, chills, nausea. Her leukocytosis is improved/stable; 16.5K. Renal function remains normal; sCr - 0.68; UO - unmeasured. No significant electrolyte derangements. She is on regular diet; seems to be tolerating well. KUB pending this morning. Rectal tube not placed again yesterday as medicine service favored enemas. She does endorse multiple bowel movements.  ? ?Review of Systems:  ?Constitutional: denies fever, chills  ?HEENT: denies cough or congestion  ?Respiratory: denies any shortness of breath  ?Cardiovascular: denies chest pain or palpitations  ?Gastrointestinal: + distension (improved significantly), denied N/V ?Genitourinary: denies burning with urination or urinary frequency ?Musculoskeletal: denies pain, decreased motor or sensatio ? ?Vital signs in last 24 hours: [min-max] current  ?Temp:  [98 ?F (36.7 ?C)-98.6 ?F (37 ?C)] 98.4 ?F (36.9 ?C) (04/26 4174) ?Pulse Rate:  [83-97] 89 (04/26 0728) ?Resp:  [18-20] 19 (04/26 0728) ?BP: (115-135)/(64-94) 120/75 (04/26 0728) ?SpO2:  [94 %-99 %] 99 % (04/26 0728)     Height: 4' 9.5" (146.1 cm) Weight: 53.1 kg BMI (Calculated): 24.86  ? ?Intake/Output last 2 shifts:  ?04/25 0701 - 04/26 0700 ?In: 0814 [P.O.:720; I.V.:551] ?Out: -   ? ?Physical Exam:  ?Constitutional: alert, cooperative and no distress  ?HENT: normocephalic without obvious abnormality  ?Eyes: PERRL, EOM's grossly intact and symmetric  ?Respiratory: On [place]; seems to be her baseline; conversational dyspnea ?Cardiovascular: regular rate; irregular ?Gastrointestinal: Soft, previous distension is markedly improved; non-tender, no rebound/guarding. She is certainly not peritonitic ?Musculoskeletal: no edema or wounds, motor and sensation grossly intact, NT   ? ? ?Labs:  ? ?  Latest Ref Rng & Units 04/10/2022  ?  5:51 AM 04/09/2022  ?  5:00 AM 04/08/2022  ?  9:01 AM  ?CBC  ?WBC 4.0 - 10.5 K/uL 16.5   19.3   19.9    ?Hemoglobin 12.0 - 15.0 g/dL 12.1   13.1   12.9    ?Hematocrit 36.0 - 46.0 % 37.6   42.2   41.2    ?Platelets 150 - 400 K/uL 387   446   428    ? ? ?  Latest Ref Rng & Units 04/10/2022  ?  5:51 AM 04/09/2022  ?  5:00 AM 04/08/2022  ?  9:01 AM  ?CMP  ?Glucose 70 - 99 mg/dL 115   138   145    ?BUN 8 - 23 mg/dL 28   34   31    ?Creatinine 0.44 - 1.00 mg/dL 0.68   0.75   0.69    ?Sodium 135 - 145 mmol/L 135   136   138    ?Potassium 3.5 - 5.1 mmol/L 3.6   3.8   4.2    ?Chloride 98 - 111 mmol/L 90   94   94    ?CO2 22 - 32 mmol/L 34   34   34    ?Calcium 8.9 - 10.3 mg/dL 9.0   9.3   9.1    ?Total Protein 6.5 - 8.1 g/dL   6.3    ?Total Bilirubin 0.3 - 1.2 mg/dL   0.4    ?Alkaline Phos 38 - 126 U/L   63    ?AST 15 - 41 U/L   14    ?ALT 0 - 44 U/L   23    ? ? ?  Imaging studies:  ? ?KUB + CXR (04/10/2022) personally reviewed and continues to have significant colonic distension without much improvement ? ? ?Assessment/Plan: (ICD-10's: K93.7) ?86 y.o. female with improvement abdominal distension secondary to significant colonic and some small bowel distension concerning for possible ileus in setting of chronic medical issues (ie: PNA) vs possible Ogilvie syndrome. She does not appear constipated on imaging this morning. SBO is a possibility in setting of previous intra-abdominal surgeries, but my suspicion is low given the colonic distension and lack of N/V.  ? ? - Appreciate GI recommendation; poor candidate for procedures. I agree with rectal tube placement for decompression; d/w RN; hospitalist held off on rectal tube in favor of enemas; patient seems to have had a positive response to this.  ? - Again, I do not think NGT has any benefit at the moment as most of her distension is colonic. Should she develop significant N/V, we may need to consider this  ? - No surgical  intervention  ?- Monitor abdominal examination; on-going bowel function ?         - Serial KUBs as needed;   ?- Pain control prn (limited narcotics); antiemetics prn ?         - Mobilization as feasible; she seems to be limited significantly by respiratory status  ?         - Further management per primary service ? ? - Patient improved with enemas; XR may be lagging behind. Nothing further to offer from surgical perspective. I will follow peripherally for now; please call with questions/concerns  ? ?All of the above findings and recommendations were discussed with the patient, and the medical team, and all of patient's  questions were answered to her expressed satisfaction. ? ?-- ?Edison Simon, PA-C ? [facility] ?04/10/2022, 8:04 AM ?M-F: 7am - 4pm ? ?

## 2022-04-10 NOTE — Progress Notes (Signed)
Inserted rectal tube pt tolerated well.  ?

## 2022-04-11 ENCOUNTER — Inpatient Hospital Stay: Payer: Medicare HMO

## 2022-04-11 DIAGNOSIS — K5981 Ogilvie syndrome: Secondary | ICD-10-CM

## 2022-04-11 DIAGNOSIS — J9621 Acute and chronic respiratory failure with hypoxia: Secondary | ICD-10-CM

## 2022-04-11 DIAGNOSIS — J9622 Acute and chronic respiratory failure with hypercapnia: Secondary | ICD-10-CM

## 2022-04-11 LAB — BASIC METABOLIC PANEL
Anion gap: 8 (ref 5–15)
BUN: 22 mg/dL (ref 8–23)
CO2: 33 mmol/L — ABNORMAL HIGH (ref 22–32)
Calcium: 8.5 mg/dL — ABNORMAL LOW (ref 8.9–10.3)
Chloride: 95 mmol/L — ABNORMAL LOW (ref 98–111)
Creatinine, Ser: 0.62 mg/dL (ref 0.44–1.00)
GFR, Estimated: >60 mL/min (ref >60)
Glucose, Bld: 99 mg/dL (ref 70–99)
Potassium: 3.9 mmol/L (ref 3.5–5.1)
Sodium: 136 mmol/L (ref 135–145)

## 2022-04-11 LAB — CBC
HCT: 38.8 % (ref 36.0–46.0)
Hemoglobin: 12.2 g/dL (ref 12.0–15.0)
MCH: 27.1 pg (ref 26.0–34.0)
MCHC: 31.4 g/dL (ref 30.0–36.0)
MCV: 86.2 fL (ref 80.0–100.0)
Platelets: 367 10*3/uL (ref 150–400)
RBC: 4.5 MIL/uL (ref 3.87–5.11)
RDW: 15.9 % — ABNORMAL HIGH (ref 11.5–15.5)
WBC: 14 10*3/uL — ABNORMAL HIGH (ref 4.0–10.5)
nRBC: 0 % (ref 0.0–0.2)

## 2022-04-11 LAB — MAGNESIUM: Magnesium: 2.2 mg/dL (ref 1.7–2.4)

## 2022-04-11 MED ORDER — HYDROXYZINE HCL 25 MG PO TABS: 25.0000 mg | ORAL_TABLET | Freq: Three times a day (TID) | ORAL | Status: DC | PRN

## 2022-04-11 MED ORDER — ENSURE ENLIVE PO LIQD: 237.0000 mL | Freq: Three times a day (TID) | ORAL | Status: DC

## 2022-04-11 NOTE — Progress Notes (Signed)
Mobility Specialist - Progress Note ? ? ? 04/11/22 1400  ?Mobility  ?Activity Refused mobility  ? ? ? ?Pt in recliner upon arrival. Pt refuses session no reason specified -- second attempt this date. Will attempt at another date and time. ? ?Jackie Turner ?Mobility Specialist ?04/11/22, 2:56 PM ? ? ? ? ?

## 2022-04-11 NOTE — Assessment & Plan Note (Addendum)
--  Less concern for SBO given mostly colonic distention and lack of N/V. ?--started rectal tube for decompression on 4/26 ?--KUB today showed improvement in colonic distention ?--d/c rectal tube today ?--advance to dys 2 diet ?

## 2022-04-11 NOTE — Progress Notes (Signed)
Nutrition Follow-up ? ?DOCUMENTATION CODES:  ? ?Not applicable ? ?INTERVENTION:  ? ?-Ensure Enlive po TID, each supplement provides 350 kcal and 20 grams of protein ?-Continue MVI with minerals daily ?-D/c Boost Breeze po TID, each supplement provides 250 kcal and 9 grams of protein  ?-30 ml Prosource Plus TID, each supplement 100 kcals and 15 grams protein  ?-Advance diet to soft; case discussed with MD and surgical PA ? ?NUTRITION DIAGNOSIS:  ? ?Increased nutrient needs related to chronic illness (bronchiectasis) as evidenced by estimated needs. ? ?Ongoing ? ?GOAL:  ? ?Patient will meet greater than or equal to 90% of their needs ? ?Progressing  ? ?MONITOR:  ? ?PO intake, Supplement acceptance, Labs, Weight trends, Skin, I & O's ? ?REASON FOR ASSESSMENT:  ? ?Consult ?Assessment of nutrition requirement/status ? ?ASSESSMENT:  ? ?Jackie Turner is a 86 y.o. female with medical history significant for Bronchiectasis, pulmonary hypertension, chronic hypoxic and hypercapnic respiratory failure on home O2 at 2 L, atrial fibrillation, no longer on Eliquis due to GI bleed and chronic iron deficiency anemia, who presents to the ED with shortness of breath and reports O2 sat of 85% on 4 L at home and speaking in short sentences.  States she has been having a productive cough for the past several days that has been worsening. ? ?4/24- s/p KUB revealed diffuse gas distention of the small and large bowel with gas present in the rectum ?4/25- KUB unchanged, rectal tube placed ? ?Reviewed I/O's: +1 L x 24 hours and +6.2 L since admission ? ?UOP: 51 ml x 24 hours ? ?Spoke with pt, who was sitting in recliner chair watching [employer] at time of visit. Noted pt consumed 75% of clear liquid tray. Pt reports decreased appetite, but improving secondary to less distention. Pt reports she has been passing gas, but has not had a bowel movement yet. Pt reports she was sipping on Ensure, but asking this RD which supplement is  best. RD received formulary with pt; she would like to try Ensure again secondary to nutritional density. Pt thinks she would now be able to tolerate more substantial food options. RD messaged surgical PA and MD; received permission to advance to soft diet.  ? ?Discussed importance of good meal and supplement intake ti promote healing.  ? ?Medications reviewed and include cardizem, miralax, and senokot.  ? ?Labs reviewed.  ? ?Diet Order:   ?Diet Order   ? ?       ?  DIET SOFT Room service appropriate? Yes; Fluid consistency: Thin  Diet effective now       ?  ? ?  ?  ? ?  ? ? ?EDUCATION NEEDS:  ? ?Education needs have been addressed ? ?Skin:  Skin Assessment: Reviewed RN Assessment ? ?Last BM:  04/11/22 ? ?Height:  ? ?Ht Readings from Last 1 Encounters:  ?04/01/22 4' 9.5" (1.461 m)  ? ? ?Weight:  ? ?Wt Readings from Last 1 Encounters:  ?04/06/22 53.1 kg  ? ? ?Ideal Body Weight:  43.6 kg ? ?BMI:  Body mass index is 24.88 kg/m?. ? ?Estimated Nutritional Needs:  ? ?Kcal:  1650-1850 ? ?Protein:  80-95 grams ? ?Fluid:  > 1.6 L ? ? ? ?Loistine Chance, RD, LDN, CDCES ?Registered Dietitian II ?Certified Diabetes Care and Education Specialist ?Please refer to [facility] for RD and/or RD on-call/weekend/after hours pager  ?

## 2022-04-11 NOTE — Progress Notes (Signed)
?Progress Note ? ? ?Patient: Jackie Turner QPR:[id number] DOB: [birth year] DOA: 04/01/2022     9 ?DOS: the patient was seen and examined on 04/11/2022 ?  ?Brief hospital course: ?Jackie Turner is a 86 y.o. female with medical history significant for Bronchiectasis, COPD, chronic hypoxic and hypercapnic respiratory failure on home 2L O2, atrial fibrillation, no longer on Eliquis due to GI bleed and chronic iron deficiency anemia, who presented to the ED with shortness of breath and reports O2 sat of 85% on 4 L at home.  Pt stated feeling generally unwell for a while. ?  ?On arrival in rapid A-fib at 129, tachypneic to 29 with temperature 101.  O2 sat 94% on 4 L.  VBG with pH 7.4, PCO2 62, which is about baseline.  COVID and flu negative.  Chest x-ray with developing infiltrate in the right lung base.  Patient started on Levaquin, DuoNeb and Solu-Medrol.  Also started on a Cardizem infusion for A-fib with RVR.  ? ?Clinically improving. ? ?4/19: Still very dyspneic with minimal exertion. ?4/20: HRs still 110's at rest, increasing rate control ?4/21: severe abdominal pain 2/2 constipation, HR's uncontrolled ?4/22: abdomen improved, having BM's, HR's better but remain elevated in 110's at rest, adjust rate control ?4/23-24: persistent abdominal distention, constipation despite aggressive bowel regimen.  G/S and GI consulted given CT findings of gastroduodenitis and terminal ileitis. ? ?Assessment and Plan: ?* Sepsis ([place]) ?Febrile, tachycardic and tachypneic with leukocytosis.  Lactic acid normal ?Sepsis fluids and treat pneumonia as outlined.  Sepsis physiology improved, tachycardia and persistent due to A-fib RVR. ? ?Right lower lobe pneumonia ?Completed course of IV Levaquin.  ?Incentive spirometry. ?DuoNebs as needed and antitussives. ?Supplement O2 per protocol. ? ?Acute on chronic respiratory failure with hypoxia and hypercapnia (HCC) ?On 2L O2 at baseline. ?Patient requiring 4 L above her baseline of 2  secondary to pneumonia, COPD, bronchiectasis ?--now back on 2L baseline ? ?Ogilvie syndrome ?--Less concern for SBO given mostly colonic distention and lack of N/V. ?--started rectal tube for decompression on 4/26 ?--KUB today showed improvement in colonic distention ?--d/c rectal tube today ?--advance to dys 2 diet ? ?Constipation ?--CT a/p on 4/23 mentioned moderate retained stool in the cecum, however, subsequent KUB showed no finding to suggest significant stool burden ?Plan: ?--cont regular bowel regimen ? ?History of IBS ?Currently adynamic colonic ileus and painful colonic distention.   ?--GI  Consulted ?Plan: ?--cont regular bowel regimen ? ?Pulmonary nodule ?New 6 mm noncalcified pleural-bases RLL nodule noted on CT scan 4/23.  See report.   ?Per radiology: "Recommend a non-contrast Chest CT at 6-12 months. If patient is high risk for malignancy, recommend an additional non-contrast Chest CT at 18-24 months; if patient is low risk for malignancy a non-contrast Chest CT at 18-24 months is optional. These guidelines do not apply to immunocompromised patients and patients with cancer.  Follow up in patients with significant comorbidities as clinically warranted." ? ?Terminal ileitis ([place]) ?Seen on CT scan 4/23, also with severe gastroduodenitis. ?Follow GI and surgery recommendations.   ?Pt with IBS-C history.   ?No prior diagnosis of IBD (UC or Crohn's). ? ?Gastroduodenitis ?See CT report from 4/23. ?--GI and surgery consulted ?--cont PPI ? ?Hyperkalemia ?K 5.4 on 4/23, given single dose Lokelma ?Resolved, K 3.8. ?Monitor BMP. ? ?Compression fracture of T10 vertebra ([place]) ?Chronic, stable.  Lidocaine patch and p.o. analgesics as needed for pain control. ? ?Abdominal pain ?On 4/21, due to severe constipation. ?Abdominal xray showed large stool  burden and marked gaseous distention of the stomach.   ?Improved with aggressive bowel regimen ?CT abd/pelvis on 4/23 with findings of severe gastritis/duodenitis,  ileitis. ?--General surgery and GI consulted, dx with Ogilvie syndrome ? ?COPD (chronic obstructive pulmonary disease) ([place]) ?Not acutely exacerbated ?DuoNebs as needed.  ?Cont formulary equivalents for Trelegy ? ?Atrial fibrillation with RVR ([place]) ?RVR being driven by acute illness, initially PNA and respiratory symptoms, now persistent in setting of GI issues. ?--started on amio bolus f/b gtt on 4/23, d/c'ed on 4/26 ? Appeared rate controlled now. ?Plan: ?--cont cardizem 360 mg daily ? ?Bronchiectasis ([place]) ?Possible bronchiectasis flare due to PNA. ?DuoNebs as Q6H and PRN. ?Transitioned IV Solu-medrol to Prednisone. ?Completed prednisone on 4/22. ? ? ? ? ?  ? ?Subjective:  ?Abdomen less painful.  KUB showed improvement in colonic distention after rectal tube decompression.  Pt had some loose stool today. ? ?Pt has been refusing mobility. ? ? ?Physical Exam: ? ?Constitutional: NAD, AAOx3 ?HEENT: conjunctivae and lids normal, EOMI ?CV: No cyanosis.   ?RESP: normal respiratory effort, on 2L ?Neuro: II - XII grossly intact.   ?Psych: depressed mood and affect.  Appropriate judgement and reason ? ? ?Data Reviewed: ? ?Family Communication:  ? ?Disposition: ?Status is: Inpatient ? ? Planned Discharge Destination: Home ? ? ? ?Time spent: 50 minutes ? ?Author: ?Enzo Bi, MD ?04/11/2022 7:28 PM ? ?For on call review [email or web address].  ?

## 2022-04-11 NOTE — Progress Notes (Signed)
?Jackie Lame, MD [facility]   ?[address] ?[place], [place] [address] ?Phone: [phone] ?Fax : [phone] ? ? ?Subjective: ?The patient was seen standing up today and out of her chair.  She states that the rectal tube is working and her abdomen is less distended and less painful although she still has some distention.  The patient enemas have been stopped and she is now doing better with just the rectal tube. ? ? ?Objective: ?Vital signs in last 24 hours: ?Vitals:  ? 04/11/22 0724 04/11/22 1145 04/11/22 1600 04/11/22 1908  ?BP: 115/67 118/87 121/82 (!) 114/56  ?Pulse: 87 89 89 90  ?Resp: '18 18 18 17  '$ ?Temp: 98.7 ?F (37.1 ?C) 97.9 ?F (36.6 ?C) 97.8 ?F (36.6 ?C) 98.1 ?F (36.7 ?C)  ?TempSrc: Oral Oral Oral Oral  ?SpO2: 100% 100% 100% 95%  ?Weight:      ?Height:      ? ?Weight change:  ? ?Intake/Output Summary (Last 24 hours) at 04/11/2022 1918 ?Last data filed at 04/11/2022 0600 ?Gross per 24 hour  ?Intake 477 ml  ?Output 51 ml  ?Net 426 ml  ? ? ? ?Exam: ?General: patient standing up at bedside and appears less distressed today ?Abdomen: Positive distention and tympanic but less than Previously ? ? ?Lab Results: ?'@LABTEST2'$ @ ?Micro Results: ?Recent Results (from the past 240 hour(s))  ?Culture, blood (Routine x 2)     Status: None  ? Collection Time: 04/01/22  9:24 PM  ? Specimen: BLOOD  ?Result Value Ref Range Status  ? Specimen Description BLOOD LEFT FOREARM  Final  ? Special Requests   Final  ?  BOTTLES DRAWN AEROBIC AND ANAEROBIC Blood Culture adequate volume  ? Culture   Final  ?  NO GROWTH 5 DAYS ?Performed at [facility], [address], [place], [place] [address] ?  ? Report Status 04/06/2022 FINAL  Final  ?Culture, blood (Routine x 2)     Status: None  ? Collection Time: 04/01/22  9:24 PM  ? Specimen: BLOOD  ?Result Value Ref Range Status  ? Specimen Description BLOOD RIGHT FOREARM  Final  ? Special Requests   Final  ?  BOTTLES DRAWN AEROBIC AND ANAEROBIC Blood Culture adequate volume  ?  Culture   Final  ?  NO GROWTH 5 DAYS ?Performed at [facility], [address], [place], [place] [address] ?  ? Report Status 04/06/2022 FINAL  Final  ?Resp Panel by RT-PCR (Flu A&B, Covid) Nasopharyngeal Swab     Status: None  ? Collection Time: 04/01/22  9:28 PM  ? Specimen: Nasopharyngeal Swab; Nasopharyngeal(NP) swabs in vial transport medium  ?Result Value Ref Range Status  ? SARS Coronavirus 2 by RT PCR NEGATIVE NEGATIVE Final  ?  Comment: (NOTE) ?SARS-CoV-2 target nucleic acids are NOT DETECTED. ? ?The SARS-CoV-2 RNA is generally detectable in upper respiratory ?specimens during the acute phase of infection. The lowest ?concentration of SARS-CoV-2 viral copies this assay can detect is ?138 copies/mL. A negative result does not preclude SARS-Cov-2 ?infection and should not be used as the sole basis for treatment or ?other patient management decisions. A negative result may occur with  ?improper specimen collection/handling, submission of specimen other ?than nasopharyngeal swab, presence of viral mutation(s) within the ?areas targeted by this assay, and inadequate number of viral ?copies(<138 copies/mL). A negative result must be combined with ?clinical observations, patient history, and epidemiological ?information. The expected result is Negative. ? ?Fact Sheet for Patients:  ?EntrepreneurPulse.com.au ? ?Fact Sheet  for Healthcare Providers:  ?IncredibleEmployment.be ? ?This test is no t yet approved or cleared by the [place] FDA and  ?has been authorized for detection and/or diagnosis of SARS-CoV-2 by ?FDA under an Emergency Use Authorization (EUA). This EUA will remain  ?in effect (meaning this test can be used) for the duration of the ?COVID-19 declaration under Section 564(b)(1) of the Act, 21 ?U.S.C.section 360bbb-3(b)(1), unless the authorization is terminated  ?or revoked sooner.  ? ? ?  ? Influenza A by PCR NEGATIVE NEGATIVE Final  ? Influenza B by  PCR NEGATIVE NEGATIVE Final  ?  Comment: (NOTE) ?The Xpert Xpress SARS-CoV-2/FLU/RSV plus assay is intended as an aid ?in the diagnosis of influenza from Nasopharyngeal swab specimens and ?should not be used as a sole basis for treatment. Nasal washings and ?aspirates are unacceptable for Xpert Xpress SARS-CoV-2/FLU/RSV ?testing. ? ?Fact Sheet for Patients: ?EntrepreneurPulse.com.au ? ?Fact Sheet for Healthcare Providers: ?IncredibleEmployment.be ? ?This test is not yet approved or cleared by the [place] FDA and ?has been authorized for detection and/or diagnosis of SARS-CoV-2 by ?FDA under an Emergency Use Authorization (EUA). This EUA will remain ?in effect (meaning this test can be used) for the duration of the ?COVID-19 declaration under Section 564(b)(1) of the Act, 21 U.S.C. ?section 360bbb-3(b)(1), unless the authorization is terminated or ?revoked. ? ?Performed at [facility], [place], ?[place] [address] ?  ?Respiratory (~20 pathogens) panel by PCR     Status: None  ? Collection Time: 04/01/22  9:28 PM  ? Specimen: Nasopharyngeal Swab; Respiratory  ?Result Value Ref Range Status  ? Adenovirus NOT DETECTED NOT DETECTED Final  ? Coronavirus 229E NOT DETECTED NOT DETECTED Final  ?  Comment: (NOTE) ?The Coronavirus on the Respiratory Panel, DOES NOT test for the novel  ?Coronavirus (2019 nCoV) ?  ? Coronavirus HKU1 NOT DETECTED NOT DETECTED Final  ? Coronavirus NL63 NOT DETECTED NOT DETECTED Final  ? Coronavirus OC43 NOT DETECTED NOT DETECTED Final  ? Metapneumovirus NOT DETECTED NOT DETECTED Final  ? Rhinovirus / Enterovirus NOT DETECTED NOT DETECTED Final  ? Influenza A NOT DETECTED NOT DETECTED Final  ? Influenza B NOT DETECTED NOT DETECTED Final  ? Parainfluenza Virus 1 NOT DETECTED NOT DETECTED Final  ? Parainfluenza Virus 2 NOT DETECTED NOT DETECTED Final  ? Parainfluenza Virus 3 NOT DETECTED NOT DETECTED Final  ? Parainfluenza Virus 4 NOT  DETECTED NOT DETECTED Final  ? Respiratory Syncytial Virus NOT DETECTED NOT DETECTED Final  ? Bordetella pertussis NOT DETECTED NOT DETECTED Final  ? Bordetella Parapertussis NOT DETECTED NOT DETECTED Final  ? Chlamydophila pneumoniae NOT DETECTED NOT DETECTED Final  ? Mycoplasma pneumoniae NOT DETECTED NOT DETECTED Final  ?  Comment: Performed at [facility], [place] [address], [place],  [address]  ? ?Studies/Results: ?DG Abd 1 View ? ?Result Date: 04/11/2022 ?CLINICAL DATA:  A female at age 86 presents with continued abdominal distension. EXAM: ABDOMEN - 1 VIEW COMPARISON:  April 10, 2022 chest x-ray and abdominal radiographs from April 09, 2022. FINDINGS: There is continued gaseous distension of the colon. Less distension is present particularly in the central abdomen when compared to previous imaging. Area of greatest distension up to 6 cm as compared to 7 cm. Descending colon is less dilated. Small amount of rectal gas is noted. EKG leads project over the abdomen and lower chest. No acute regional skeletal process. IMPRESSION: 1. Improving colonic ileus. Correlate with signs of pseudo-obstruction/institutional colon and consider continued  attention on subsequent imaging. Electronically Signed   By: Zetta Bills M.D.   On: 04/11/2022 10:48  ? ?DG ABD ACUTE 2+V W 1V CHEST ? ?Result Date: 04/10/2022 ?CLINICAL DATA:  Abdominal pain EXAM: DG ABDOMEN ACUTE WITH 1 VIEW CHEST COMPARISON:  April 08, 2022 FINDINGS: Dilated loops of bowel, predominantly colon. No rectal air. Visualized lung bases demonstrate bibasilar atelectasis with marked elevation of the left hemidiaphragm. IMPRESSION: Dilated loops of bowel, predominantly colon. Similar to prior exam and concerning for ileus. Electronically Signed   By: Yetta Glassman M.D.   On: 04/10/2022 08:11   ?Medications: I have reviewed the patient's current medications. ?Scheduled Meds: ? (feeding supplement) PROSource Plus  30 mL Oral TID BM  ? diltiazem  360 mg  Oral Daily  ? doxepin  30 mg Oral QHS  ? enoxaparin (LOVENOX) injection  40 mg Subcutaneous Q24H  ? feeding supplement  237 mL Oral TID BM  ? fluticasone furoate-vilanterol  1 puff Inhalation Daily  ? l

## 2022-04-11 NOTE — Plan of Care (Signed)
  Problem: Health Behavior/Discharge Planning: Goal: Ability to manage health-related needs will improve Outcome: Progressing   Problem: Clinical Measurements: Goal: Respiratory complications will improve Outcome: Progressing   Problem: Clinical Measurements: Goal: Cardiovascular complication will be avoided Outcome: Progressing   Problem: Elimination: Goal: Will not experience complications related to bowel motility Outcome: Progressing   Problem: Elimination: Goal: Will not experience complications related to urinary retention Outcome: Progressing   Problem: Pain Managment: Goal: General experience of comfort will improve Outcome: Progressing   Problem: Safety: Goal: Ability to remain free from injury will improve Outcome: Progressing   

## 2022-04-12 DIAGNOSIS — Z515 Encounter for palliative care: Secondary | ICD-10-CM | POA: Diagnosis not present

## 2022-04-12 DIAGNOSIS — Z7189 Other specified counseling: Secondary | ICD-10-CM | POA: Diagnosis not present

## 2022-04-12 DIAGNOSIS — K5981 Ogilvie syndrome: Secondary | ICD-10-CM | POA: Diagnosis not present

## 2022-04-12 DIAGNOSIS — A419 Sepsis, unspecified organism: Secondary | ICD-10-CM | POA: Diagnosis not present

## 2022-04-12 LAB — BASIC METABOLIC PANEL
Anion gap: 8 (ref 5–15)
BUN: 24 mg/dL — ABNORMAL HIGH (ref 8–23)
CO2: 33 mmol/L — ABNORMAL HIGH (ref 22–32)
Calcium: 8.4 mg/dL — ABNORMAL LOW (ref 8.9–10.3)
Chloride: 95 mmol/L — ABNORMAL LOW (ref 98–111)
Creatinine, Ser: 0.65 mg/dL (ref 0.44–1.00)
GFR, Estimated: >60 mL/min (ref >60)
Glucose, Bld: 115 mg/dL — ABNORMAL HIGH (ref 70–99)
Potassium: 3.9 mmol/L (ref 3.5–5.1)
Sodium: 136 mmol/L (ref 135–145)

## 2022-04-12 LAB — CBC
HCT: 38.5 % (ref 36.0–46.0)
Hemoglobin: 12.1 g/dL (ref 12.0–15.0)
MCH: 27.4 pg (ref 26.0–34.0)
MCHC: 31.4 g/dL (ref 30.0–36.0)
MCV: 87.3 fL (ref 80.0–100.0)
Platelets: 325 10*3/uL (ref 150–400)
RBC: 4.41 MIL/uL (ref 3.87–5.11)
RDW: 15.9 % — ABNORMAL HIGH (ref 11.5–15.5)
WBC: 12.1 10*3/uL — ABNORMAL HIGH (ref 4.0–10.5)
nRBC: 0 % (ref 0.0–0.2)

## 2022-04-12 LAB — MAGNESIUM: Magnesium: 2.2 mg/dL (ref 1.7–2.4)

## 2022-04-12 MED ORDER — NYSTATIN 100000 UNIT/ML MT SUSP: 5.0000 mL | Freq: Four times a day (QID) | OROMUCOSAL | Status: AC | PRN

## 2022-04-12 MED ORDER — FLUTICASONE PROPIONATE 50 MCG/ACT NA SUSP: 2.0000 | Freq: Every day | NASAL | Status: AC | PRN

## 2022-04-12 MED ORDER — DILTIAZEM HCL ER COATED BEADS 360 MG PO CP24: 360.0000 mg | ORAL_CAPSULE | Freq: Every day | ORAL | 2 refills | Status: AC

## 2022-04-12 MED ORDER — PANTOPRAZOLE SODIUM 40 MG PO TBEC
40.0000 mg | DELAYED_RELEASE_TABLET | Freq: Two times a day (BID) | ORAL | 2 refills | Status: AC
Start: 2022-04-12 — End: 2022-07-11

## 2022-04-12 MED ORDER — OXYBUTYNIN CHLORIDE ER 15 MG PO TB24: 15.0000 mg | ORAL_TABLET | Freq: Every evening | ORAL | Status: AC | PRN

## 2022-04-12 NOTE — Progress Notes (Signed)
Discharge instructions provided to patient. All medications, discharge instructions, and follow up appointments discussed. IV out. Monitor off CCMD notified. Discharging to home.  ?Era Bumpers, RN  ?

## 2022-04-12 NOTE — Care Management Important Message (Signed)
Important Message ? ?Patient Details  ?Name: Jackie Turner ?MRN: [id number] ?Date of Birth: 1936-09-24 ? ? ?Medicare Important Message Given:  Yes ? ? ? ? ?Dannette Barbara ?04/12/2022, 11:09 AM ?

## 2022-04-12 NOTE — Discharge Summary (Signed)
? ?Physician Discharge Summary ? ? ?[name]  female DOB: 1936-03-07  ?OQH:[id number] ? ?PCP: Virginia Crews, MD ? ?Admit date: 04/01/2022 ?Discharge date: 04/12/2022 ? ?Admitted From: home ?Disposition:  home ?Home Health: refused HH ?CODE STATUS: DNR ? ?Discharge Instructions   ? ? Amb referral to AFIB Clinic   Complete by: As directed ?  ? ?  ? ?Hospital Course:  ?For full details, please see H&P, progress notes, consult notes and ancillary notes.  ?Briefly,  ?Jackie Turner is a 86 y.o. female with medical history significant for Bronchiectasis, COPD, chronic hypoxic and hypercapnic respiratory failure on home 2L O2, atrial fibrillation, no longer on Eliquis due to GI bleed and chronic iron deficiency anemia, who presented to the ED with shortness of breath and reports O2 sat of 85% on 4 L at home.  Pt stated feeling generally unwell for a while. ?  ?On arrival in rapid A-fib at 129, tachypneic to 29 with temperature 101.  O2 sat 94% on 4 L.  VBG with pH 7.4, PCO2 62, which is about baseline.  COVID and flu negative.  Chest x-ray with developing infiltrate in the right lung base.  Patient started on Levaquin, DuoNeb and Solu-Medrol.  Also started on a Cardizem infusion for A-fib with RVR.  ? ?* Sepsis ([place]) ?Febrile, tachycardic and tachypneic with leukocytosis.  Lactic acid normal ?Sepsis fluids and treat pneumonia as outlined.   ?  ?Right lower lobe pneumonia ?Completed course of IV Levaquin.  ?  ?Acute on chronic respiratory failure with hypoxia and hypercapnia (HCC) ?On 2L O2 at baseline. ?Patient requiring 4 L above her baseline of 2 secondary to pneumonia, COPD, bronchiectasis ?--back on 2L baseline prior to discharge ? ?Bronchiectasis ([place]) ?Possible bronchiectasis flare due to PNA. ?DuoNebs as Q6H and PRN. ?Transitioned IV Solu-medrol to Prednisone. ?Completed prednisone on 4/22. ? ?COPD (chronic obstructive pulmonary disease) ([place]) ?DuoNebs as needed.  ?Cont formulary equivalents  for Trelegy ? ?Atrial fibrillation with RVR ([place]) ?RVR being driven by acute illness, initially PNA and respiratory symptoms, now persistent in setting of GI issues. ?--started on amio bolus f/b gtt on 4/23, d/c'ed on 4/26.   ?--started on cont cardizem 360 mg daily and continue after discharge. ?  ?Abdominal pain 2/2 Ogilvie syndrome ?--General surgery and GI consulted, dx with Ogilvie syndrome.  Less concern for SBO given mostly colonic distention and lack of N/V. ?--started rectal tube for decompression on 4/26 ?--KUB showed improvement in colonic distention, rectal tube removed on 4/27, per GenSurg clearance.  Pt was passing gas on her own.  Pt was tolerating oral diet prior to discharge. ?--discuss with PCP to consider avoiding anticholinergic medications such home Ditropan, Bentyl, doxepin, due to Ogilvie  ?  ?Constipation ?--CT a/p on 4/23 mentioned moderate retained stool in the cecum, however, subsequent KUB showed no finding to suggest significant stool burden ?--cont regular bowel regimen ?  ?History of IBS ?Currently adynamic colonic ileus and painful colonic distention.   ?--GI  Consulted ?--cont regular bowel regimen ?  ?Pulmonary nodule ?New 6 mm noncalcified pleural-bases RLL nodule noted on CT scan 4/23.   ?--outpatient followup  ? ?Gastroduodenitis ?Terminal ileitis ([place]) ?Seen on CT report 4/23. ?Pt with IBS-C history.   ?No prior diagnosis of IBD (UC or Crohn's). ?--GI and surgery consulted ?--cont PPI ?  ?Hyperkalemia ?K 5.4 on 4/23, given single dose Lokelma ?Resolved ? ?Compression fracture of T10 vertebra (HCC) ?Chronic, stable.  Lidocaine patch and home tramadol as needed for  pain control. ?   ? ?Discharge Diagnoses:  ?Principal Problem: ?  Sepsis ([place]) ?Active Problems: ?  Acute on chronic respiratory failure with hypoxia and hypercapnia (HCC) ?  Right lower lobe pneumonia ?  Bronchiectasis ([place]) ?  Atrial fibrillation with RVR ([place]) ?  COPD (chronic obstructive pulmonary disease) ([place]) ?   Abdominal pain ?  Compression fracture of T10 vertebra (HCC) ?  Hyperkalemia ?  Gastroduodenitis ?  Terminal ileitis ([place]) ?  Pulmonary nodule ?  History of IBS ?  Constipation ?  Ogilvie syndrome ? ? ?30 Day Unplanned Readmission Risk Score   ? ?Flowsheet Row ED to Hosp-Admission (Current) from 04/01/2022 in [facility]  ?30 Day Unplanned Readmission Risk Score (%) 20.01 Filed at 04/12/2022 0801  ? ?  ? ? This score is the patient's risk of an unplanned readmission within 30 days of being discharged (0 -100%). The score is based on dignosis, age, lab data, medications, orders, and past utilization.   ?Low:  0-14.9   Medium: 15-21.9   High: 22-29.9   Extreme: 30 and above ? ?  ? ?  ? ? ?Discharge Instructions: ? ?Allergies as of 04/12/2022   ? ?   Reactions  ? Nsaids Other (See Comments)  ? History of gastric ulcer  ? Amoxicillin Nausea Only, Rash  ? Boniva [ibandronic Acid] Other (See Comments)  ? Muscle weakness - advised not to take ?**BONIVA** - drug name  ? Actonel  [risedronate] Other (See Comments)  ? Gastric ulcers  ? Antihistamines, Chlorpheniramine-type   ? Aspirin   ? Gastric ulcer  ? Buspar [buspirone] Other (See Comments)  ? Pt does not remember reaction   ? Butalbital-aspirin-caffeine Other (See Comments)  ? Other reaction(s): Unknown  ? Clarithromycin Other (See Comments)  ? Bloating ?[place]*  ? Codeine   ? Gatifloxacin Other (See Comments)  ? Hydrocodone-guaifenesin Nausea Only  ? CODICLEAR [place]   ? Moxifloxacin   ? Oxycodone Other (See Comments)  ? Dizziness  ? Penicillins Other (See Comments)  ? Risedronate Sodium   ? Gastric ulcer  ? Tylenol With Codeine #3  [acetaminophen-codeine]   ? Other reaction(s): Unknown  ? Azithromycin Other (See Comments)  ? Pt states med causes severe abd pain  ? Raloxifene Other (See Comments)  ? Bloating ?Bloating ?*EVISTA*  ? ?  ? ?  ?Medication List  ?  ? ?STOP taking these medications   ? ?cetirizine 10 MG tablet ?Commonly known as:  ZYRTEC ?  ?verapamil 120 MG CR tablet ?Commonly known as: CALAN-SR ?  ? ?  ? ?TAKE these medications   ? ?Calcium Carb-Cholecalciferol 600-400 MG-UNIT Tabs ?Take 2 tablets by mouth 2 (two) times daily. ?  ?dicyclomine 10 MG capsule ?Commonly known as: BENTYL ?TAKE 1 CAPSULE BY MOUTH 3 TIMES DAILY ASNEEDED FOR SPASMS ?  ?diltiazem 360 MG 24 hr capsule ?Commonly known as: CARDIZEM CD ?Take 1 capsule (360 mg total) by mouth daily. ?  ?doxepin 10 MG capsule ?Commonly known as: SINEQUAN ?TAKE 3 CAPSULES BY MOUTH AT BEDTIME ?  ?feeding supplement Liqd ?Take 237 mLs by mouth 2 (two) times daily between meals. ?  ?ferrous gluconate 324 MG tablet ?Commonly known as: FERGON ?Take 324 mg by mouth daily with breakfast. ?  ?fluticasone 50 MCG/ACT nasal spray ?Commonly known as: FLONASE ?Place 2 sprays into both nostrils daily as needed. Home med. ?What changed:  ?when to take this ?reasons to take this ?additional instructions ?  ?furosemide 40  MG tablet ?Commonly known as: LASIX ?Take 1 tablet (40 mg total) by mouth daily as needed for edema. ?  ?melatonin 3 MG Tabs tablet ?Take 1 tablet (3 mg total) by mouth at bedtime as needed. ?  ?nystatin 100000 UNIT/ML suspension ?Commonly known as: MYCOSTATIN ?Take 5 mLs (500,000 Units total) by mouth 4 (four) times daily as needed. Home med. ?What changed:  ?how much to take ?how to take this ?when to take this ?reasons to take this ?additional instructions ?  ?oxybutynin 15 MG 24 hr tablet ?Commonly known as: DITROPAN XL ?Take 1 tablet (15 mg total) by mouth at bedtime as needed. Home med. ?What changed:  ?when to take this ?reasons to take this ?additional instructions ?  ?OXYGEN ?Inhale 2 L into the lungs daily. ?  ?pantoprazole 40 MG tablet ?Commonly known as: PROTONIX ?Take 1 tablet (40 mg total) by mouth 2 (two) times daily. ?  ?pentosan polysulfate 100 MG capsule ?Commonly known as: ELMIRON ?Take 1 capsule (100 mg total) by mouth 2 (two) times daily as needed (urinary pain). ?   ?PRESERVISION AREDS PO ?Take 2 tablets by mouth 2 (two) times daily. ?  ?traMADol 50 MG tablet ?Commonly known as: ULTRAM ?Take 1 tablet (50 mg total) by mouth every 8 (eight) hours as needed. for pain ?What

## 2022-04-12 NOTE — Progress Notes (Signed)
Palliative: ?Jackie Turner is sitting up in the [place] chair in her room.  She greets me, making and somewhat keeping eye contact.  She is alert and oriented, able to make her basic needs known.  There is no family at bedside at this time. ? ?Jackie Turner tells me that she is to be discharging home today.  She has a paid caregiver coming to pick her up and will also stay with her.  Jackie Turner shares that she has the ability to stay with her sister if she needs to do so.  We talk about her bowel issues.  She shares that she has had a bowel movement this morning. ? ?We talk about outpatient palliative services.  At this point Jackie Turner declines outpatient palliative services.  I shared that she can set this up through her family physician at any time, if she so desires.  She states understanding. ? ?Conference with attending, bedside nursing staff, transition of care team related to patient condition, needs, goals of care, disposition. ?DNR/goldenrod form on chart. ? ?Plan: At this point continue to treat the treatable but no CPR or intubation.  Continue to rehospitalize as needed. ? ?25 minutes ?[name], NP ?Palliative medicine team ?Team phone [phone] ?Greater than 50% of this time was spent counseling and coordinating care related to the above assessment and plan. ?

## 2022-04-15 ENCOUNTER — Emergency Department: Payer: Medicare HMO

## 2022-04-15 ENCOUNTER — Encounter: Payer: Self-pay | Admitting: Emergency Medicine

## 2022-04-15 ENCOUNTER — Ambulatory Visit: Payer: Self-pay | Admitting: *Deleted

## 2022-04-15 ENCOUNTER — Inpatient Hospital Stay
Admission: EM | Admit: 2022-04-15 | Discharge: 2022-05-16 | DRG: 189 | Disposition: E | Payer: Medicare HMO | Attending: Internal Medicine | Admitting: Internal Medicine

## 2022-04-15 ENCOUNTER — Other Ambulatory Visit: Payer: Self-pay

## 2022-04-15 DIAGNOSIS — J449 Chronic obstructive pulmonary disease, unspecified: Secondary | ICD-10-CM | POA: Diagnosis present

## 2022-04-15 DIAGNOSIS — Z881 Allergy status to other antibiotic agents status: Secondary | ICD-10-CM

## 2022-04-15 DIAGNOSIS — I272 Pulmonary hypertension, unspecified: Secondary | ICD-10-CM | POA: Diagnosis present

## 2022-04-15 DIAGNOSIS — J479 Bronchiectasis, uncomplicated: Secondary | ICD-10-CM

## 2022-04-15 DIAGNOSIS — Z66 Do not resuscitate: Secondary | ICD-10-CM | POA: Diagnosis present

## 2022-04-15 DIAGNOSIS — J9621 Acute and chronic respiratory failure with hypoxia: Secondary | ICD-10-CM | POA: Diagnosis not present

## 2022-04-15 DIAGNOSIS — J471 Bronchiectasis with (acute) exacerbation: Secondary | ICD-10-CM

## 2022-04-15 DIAGNOSIS — J45909 Unspecified asthma, uncomplicated: Secondary | ICD-10-CM | POA: Diagnosis present

## 2022-04-15 DIAGNOSIS — J441 Chronic obstructive pulmonary disease with (acute) exacerbation: Secondary | ICD-10-CM | POA: Diagnosis not present

## 2022-04-15 DIAGNOSIS — Z9981 Dependence on supplemental oxygen: Secondary | ICD-10-CM

## 2022-04-15 DIAGNOSIS — Z8249 Family history of ischemic heart disease and other diseases of the circulatory system: Secondary | ICD-10-CM

## 2022-04-15 DIAGNOSIS — Z885 Allergy status to narcotic agent status: Secondary | ICD-10-CM

## 2022-04-15 DIAGNOSIS — I4891 Unspecified atrial fibrillation: Secondary | ICD-10-CM | POA: Diagnosis not present

## 2022-04-15 DIAGNOSIS — Z886 Allergy status to analgesic agent status: Secondary | ICD-10-CM

## 2022-04-15 DIAGNOSIS — Z803 Family history of malignant neoplasm of breast: Secondary | ICD-10-CM

## 2022-04-15 DIAGNOSIS — J4540 Moderate persistent asthma, uncomplicated: Secondary | ICD-10-CM | POA: Diagnosis not present

## 2022-04-15 DIAGNOSIS — E7800 Pure hypercholesterolemia, unspecified: Secondary | ICD-10-CM

## 2022-04-15 DIAGNOSIS — I5033 Acute on chronic diastolic (congestive) heart failure: Secondary | ICD-10-CM | POA: Diagnosis present

## 2022-04-15 DIAGNOSIS — J9622 Acute and chronic respiratory failure with hypercapnia: Secondary | ICD-10-CM | POA: Diagnosis present

## 2022-04-15 DIAGNOSIS — Z888 Allergy status to other drugs, medicaments and biological substances status: Secondary | ICD-10-CM

## 2022-04-15 DIAGNOSIS — K5981 Ogilvie syndrome: Secondary | ICD-10-CM | POA: Diagnosis present

## 2022-04-15 DIAGNOSIS — E785 Hyperlipidemia, unspecified: Secondary | ICD-10-CM | POA: Diagnosis present

## 2022-04-15 DIAGNOSIS — G9341 Metabolic encephalopathy: Secondary | ICD-10-CM | POA: Diagnosis present

## 2022-04-15 DIAGNOSIS — R54 Age-related physical debility: Secondary | ICD-10-CM | POA: Diagnosis present

## 2022-04-15 DIAGNOSIS — M810 Age-related osteoporosis without current pathological fracture: Secondary | ICD-10-CM | POA: Diagnosis present

## 2022-04-15 DIAGNOSIS — Z7951 Long term (current) use of inhaled steroids: Secondary | ICD-10-CM

## 2022-04-15 DIAGNOSIS — I110 Hypertensive heart disease with heart failure: Secondary | ICD-10-CM | POA: Diagnosis present

## 2022-04-15 DIAGNOSIS — J189 Pneumonia, unspecified organism: Secondary | ICD-10-CM

## 2022-04-15 DIAGNOSIS — Z515 Encounter for palliative care: Secondary | ICD-10-CM

## 2022-04-15 DIAGNOSIS — Z880 Allergy status to penicillin: Secondary | ICD-10-CM

## 2022-04-15 DIAGNOSIS — Z79899 Other long term (current) drug therapy: Secondary | ICD-10-CM

## 2022-04-15 LAB — BLOOD GAS, VENOUS
Acid-Base Excess: 13.6 mmol/L — ABNORMAL HIGH (ref 0.0–2.0)
Bicarbonate: 43.2 mmol/L — ABNORMAL HIGH (ref 20.0–28.0)
O2 Saturation: 75.1 %
Patient temperature: 37
pCO2, Ven: 80 mmHg (ref 44–60)
pH, Ven: 7.34 (ref 7.25–7.43)
pO2, Ven: 46 mmHg — ABNORMAL HIGH (ref 32–45)

## 2022-04-15 LAB — BASIC METABOLIC PANEL
Anion gap: 7 (ref 5–15)
BUN: 15 mg/dL (ref 8–23)
CO2: 39 mmol/L — ABNORMAL HIGH (ref 22–32)
Calcium: 8.7 mg/dL — ABNORMAL LOW (ref 8.9–10.3)
Chloride: 94 mmol/L — ABNORMAL LOW (ref 98–111)
Creatinine, Ser: 0.59 mg/dL (ref 0.44–1.00)
GFR, Estimated: >60 mL/min (ref >60)
Glucose, Bld: 116 mg/dL — ABNORMAL HIGH (ref 70–99)
Potassium: 4.4 mmol/L (ref 3.5–5.1)
Sodium: 140 mmol/L (ref 135–145)

## 2022-04-15 LAB — CBC WITH DIFFERENTIAL/PLATELET
Abs Immature Granulocytes: 0.18 10*3/uL — ABNORMAL HIGH (ref 0.00–0.07)
Basophils Absolute: 0 10*3/uL (ref 0.0–0.1)
Basophils Relative: 0 %
Eosinophils Absolute: 0.2 10*3/uL (ref 0.0–0.5)
Eosinophils Relative: 1 %
HCT: 36.8 % (ref 36.0–46.0)
Hemoglobin: 10.9 g/dL — ABNORMAL LOW (ref 12.0–15.0)
Immature Granulocytes: 1 %
Lymphocytes Relative: 3 %
Lymphs Abs: 0.5 10*3/uL — ABNORMAL LOW (ref 0.7–4.0)
MCH: 27.3 pg (ref 26.0–34.0)
MCHC: 29.6 g/dL — ABNORMAL LOW (ref 30.0–36.0)
MCV: 92.2 fL (ref 80.0–100.0)
Monocytes Absolute: 1.2 10*3/uL — ABNORMAL HIGH (ref 0.1–1.0)
Monocytes Relative: 6 %
Neutro Abs: 17.8 10*3/uL — ABNORMAL HIGH (ref 1.7–7.7)
Neutrophils Relative %: 89 %
Platelets: 358 10*3/uL (ref 150–400)
RBC: 3.99 MIL/uL (ref 3.87–5.11)
RDW: 16.4 % — ABNORMAL HIGH (ref 11.5–15.5)
WBC: 19.9 10*3/uL — ABNORMAL HIGH (ref 4.0–10.5)
nRBC: 0 % (ref 0.0–0.2)

## 2022-04-15 LAB — LACTIC ACID, PLASMA: Lactic Acid, Venous: 1.2 mmol/L (ref 0.5–1.9)

## 2022-04-15 LAB — TROPONIN I (HIGH SENSITIVITY)
Troponin I (High Sensitivity): 12 ng/L (ref <18)
Troponin I (High Sensitivity): 25 ng/L — ABNORMAL HIGH (ref <18)

## 2022-04-15 LAB — PROCALCITONIN: Procalcitonin: <0.1 ng/mL

## 2022-04-15 MED ORDER — IPRATROPIUM-ALBUTEROL 0.5-2.5 (3) MG/3ML IN SOLN: 3.0000 mL | RESPIRATORY_TRACT | Status: DC | PRN

## 2022-04-15 MED ORDER — ONDANSETRON HCL 4 MG/2ML IJ SOLN: 4.0000 mg | Freq: Four times a day (QID) | INTRAMUSCULAR | Status: DC | PRN

## 2022-04-15 MED ORDER — FLUTICASONE PROPIONATE 50 MCG/ACT NA SUSP: 2.0000 | Freq: Every day | NASAL | Status: DC | PRN

## 2022-04-15 MED ORDER — VANCOMYCIN HCL 1250 MG/250ML IV SOLN
1250.0000 mg | Freq: Once | INTRAVENOUS | Status: AC
  Administered 2022-04-15: 1250 mg via INTRAVENOUS
  Filled 2022-04-15: qty 250

## 2022-04-15 MED ORDER — IPRATROPIUM-ALBUTEROL 0.5-2.5 (3) MG/3ML IN SOLN
3.0000 mL | Freq: Once | RESPIRATORY_TRACT | Status: AC
  Administered 2022-04-15: 3 mL via RESPIRATORY_TRACT
  Filled 2022-04-15: qty 3

## 2022-04-15 MED ORDER — SODIUM CHLORIDE 0.9 % IV SOLN: 250.0000 mL | INTRAVENOUS | Status: DC | PRN

## 2022-04-15 MED ORDER — HYDRALAZINE HCL 20 MG/ML IJ SOLN: 10.0000 mg | Freq: Four times a day (QID) | INTRAMUSCULAR | Status: DC | PRN

## 2022-04-15 MED ORDER — SODIUM CHLORIDE 0.9% FLUSH
3.0000 mL | Freq: Two times a day (BID) | INTRAVENOUS | Status: DC
  Administered 2022-04-15: 3 mL via INTRAVENOUS

## 2022-04-15 MED ORDER — FUROSEMIDE 10 MG/ML IJ SOLN
40.0000 mg | Freq: Once | INTRAMUSCULAR | Status: AC
Start: 2022-04-15 — End: 2022-04-15
  Administered 2022-04-15: 40 mg via INTRAVENOUS
  Filled 2022-04-15: qty 4

## 2022-04-15 MED ORDER — PANTOPRAZOLE SODIUM 40 MG PO TBEC
40.0000 mg | DELAYED_RELEASE_TABLET | Freq: Two times a day (BID) | ORAL | Status: DC
  Administered 2022-04-16: 40 mg via ORAL
  Filled 2022-04-15 (×2): qty 1

## 2022-04-15 MED ORDER — SODIUM CHLORIDE 0.9% FLUSH: 3.0000 mL | INTRAVENOUS | Status: DC | PRN

## 2022-04-15 MED ORDER — SODIUM CHLORIDE 0.9 % IV SOLN
2.0000 g | Freq: Two times a day (BID) | INTRAVENOUS | Status: DC
  Administered 2022-04-15: 2 g via INTRAVENOUS
  Filled 2022-04-15: qty 12.5

## 2022-04-15 MED ORDER — METHYLPREDNISOLONE SODIUM SUCC 40 MG IJ SOLR
40.0000 mg | Freq: Two times a day (BID) | INTRAMUSCULAR | Status: DC
  Administered 2022-04-15 – 2022-04-16 (×2): 40 mg via INTRAVENOUS
  Filled 2022-04-15 (×2): qty 1

## 2022-04-15 MED ORDER — IOHEXOL 350 MG/ML SOLN
75.0000 mL | Freq: Once | INTRAVENOUS | Status: AC | PRN
  Administered 2022-04-15: 75 mL via INTRAVENOUS

## 2022-04-15 MED ORDER — ENOXAPARIN SODIUM 40 MG/0.4ML IJ SOSY: 40.0000 mg | PREFILLED_SYRINGE | INTRAMUSCULAR | Status: DC

## 2022-04-15 MED ORDER — VANCOMYCIN HCL 1250 MG/250ML IV SOLN: 1250.0000 mg | INTRAVENOUS | Status: DC

## 2022-04-15 MED ORDER — ONDANSETRON HCL 4 MG PO TABS: 4.0000 mg | ORAL_TABLET | Freq: Four times a day (QID) | ORAL | Status: DC | PRN

## 2022-04-15 MED ORDER — METHYLPREDNISOLONE SODIUM SUCC 125 MG IJ SOLR
125.0000 mg | INTRAMUSCULAR | Status: AC
  Administered 2022-04-15: 125 mg via INTRAVENOUS
  Filled 2022-04-15: qty 2

## 2022-04-15 MED ORDER — DILTIAZEM HCL-DEXTROSE 125-5 MG/125ML-% IV SOLN (PREMIX)
5.0000 mg/h | INTRAVENOUS | Status: DC
  Administered 2022-04-16: 5 mg/h via INTRAVENOUS
  Filled 2022-04-15: qty 125

## 2022-04-15 MED ORDER — DILTIAZEM HCL ER COATED BEADS 180 MG PO CP24
360.0000 mg | ORAL_CAPSULE | Freq: Every day | ORAL | Status: DC
  Filled 2022-04-15: qty 2

## 2022-04-15 NOTE — Consult Note (Signed)
Pharmacy Antibiotic Note ? ?Jackie Turner is a 86 y.o. female admitted on 04/27/2022 with shortness of breath and difficulty breathing.  Pharmacy has been consulted for Vancomycin and Cefepime dosing. ? ?Plan: ?Vancomycin '1250mg'$  IV x 1 as loading dose followed by 1250 mg IV Q 48 hrs. Goal AUC 400-550. ?Expected AUC: 509.3 ?Expected Css: 9.7 ?SCr used: 0.8(actual 0.59) ? ?Cefepime 2g IV Q12 hours ? ?Height: '4\' 9"'$  (144.8 cm) ?Weight: 55.2 kg (121 lb 11.1 oz) ?IBW/kg (Calculated) : 38.6 ? ?Temp (24hrs), Avg:98.2 ?F (36.8 ?C), Min:98.2 ?F (36.8 ?C), Max:98.2 ?F (36.8 ?C) ? ?Recent Labs  ?Lab 04/09/22 ?0500 04/10/22 ?4174 04/11/22 ?0408 04/12/22 ?0622 04/28/2022 ?1512 04/21/2022 ?1704  ?WBC 19.3* 16.5* 14.0* 12.1* 19.9*  --   ?CREATININE 0.75 0.68 0.62 0.65 0.59  --   ?LATICACIDVEN  --   --   --   --   --  1.2  ?  ?Estimated Creatinine Clearance: 37.4 mL/min (by C-G formula based on SCr of 0.59 mg/dL).   ? ?Allergies  ?Allergen Reactions  ? Nsaids Other (See Comments)  ?  History of gastric ulcer  ? Amoxicillin Nausea Only and Rash  ? Boniva [Ibandronic Acid] Other (See Comments)  ?  Muscle weakness - advised not to take ? ?**BONIVA** - drug name  ? Actonel  [Risedronate] Other (See Comments)  ?  Gastric ulcers  ? Antihistamines, Chlorpheniramine-Type   ? Aspirin   ?  Gastric ulcer  ? Buspar [Buspirone] Other (See Comments)  ?  Pt does not remember reaction   ? Butalbital-Aspirin-Caffeine Other (See Comments)  ?  Other reaction(s): Unknown  ? Clarithromycin Other (See Comments)  ?  Bloating ? ?Jackie Turner*  ? Codeine   ? Gatifloxacin Other (See Comments)  ? Hydrocodone-Guaifenesin Nausea Only  ?  Jackie Turner   ? Moxifloxacin   ? Oxycodone Other (See Comments)  ?  Dizziness  ? Penicillins Other (See Comments)  ? Risedronate Sodium   ?  Gastric ulcer  ? Tylenol With Codeine #3  [Acetaminophen-Codeine]   ?  Other reaction(s): Unknown  ? Azithromycin Other (See Comments)  ?  Pt states med causes severe abd pain  ?  Raloxifene Other (See Comments)  ?  Bloating ?Bloating ? ?*Jackie Turner*  ? ? ?Antimicrobials this admission: ?Vanc 5/1 >>  ?CFP 5/1 >>  ? ?Dose adjustments this admission: ?N/a ? ?Microbiology results: ?5/1 BCx: sent ?5/1 Resp Cx: pending  ?5/1 MRSA PCR: pending ? ?Thank you for allowing pharmacy to be a part of this patient?s care. ? ?Jackie Turner ?05/04/2022 7:47 PM ? ?

## 2022-04-15 NOTE — Hospital Course (Addendum)
Jackie Turner is a 86 y.o. female with past medical history of bronchiectasis, COPD, chronic hypoxic respiratory failure on home oxygen at 2 L/min, history of atrial fibrillation not on anticoagulation due to GI bleed presented to hospital with shortness of breath and difficulty breathing for last 1 day.  Of note patient was recently admitted to hospital and was discharged on 04/12/2022 after being treated for sepsis, atrial fibrillation with RVR, COPD exacerbation bronchiectasis and acute on chronic respiratory failure.  Patient was very somnolent and with increased work of breathing at the time of my evaluation but denied any chest pain, nausea or vomiting.  Patient's sister at bedside reported that she was coughing some but without as much sputum production.  Did not have any nausea vomiting or diarrhea at home.  History is limited due to patient's condition including respiratory distress.  On last admission patient was seen by palliative care but had refused palliative care services at that time.  ? ?In the ED, patient was noted to being in respiratory distress and was put on nasal cannula 4 L/min.  Patient was somnolent but answering intermittently.  He is seen to have increased work of breathing.  She was tachycardic but blood pressure was within normal range.  Initial labs showed a procalcitonin less than 0.1, lactate of 1.2.  WBC was elevated at 19.9.  ABG showed PCO2 of 80 with a pH of 7.3.  Patient was then considered for admission to the hospital for further evaluation and treatment. ? ? ?Assessment and Plan: ? ?Principal Problem: ?  Acute exacerbation of chronic obstructive pulmonary disease (COPD) ([place]) ?Active Problems: ?  Acute on chronic respiratory failure with hypoxia and hypercapnia (HCC) ?  Hyperlipidemia ?  Asthma ?  Bronchiectasis ([place]) ?  COPD (chronic obstructive pulmonary disease) ([place]) ?  A-fib ([place]) ?  ?Acute chronic hypoxic and hypercarbic respiratory failure.   ?On oxygen at 2  L/min at baseline.  Has had recent hospitalization on 04/02/2022 and was recently discharged 04/12/2022 after prolonged hospitalization.  Patient was offered palliative care at the time but had refused.  Patient has come back to the hospital with increasing respiratory distress.  ABG today with PCO2 of 80 with pH of 7.3.  Patient appears to be somnolent at this time.  Spoke with the patient's sister at the bedside at length about her.  We will try BiPAP.  Patient had refused CPR and intubation last admission and continues to refuse at this time.  Prognosis of the patient is guarded at this time and overall outcome and prognosis is poor as well.  We will continue with IV Solu-Medrol, ventilatory support with BiPAP.  No plans for intubation or CPR at this time.  If patient continues to deteriorate family is willing to consider comfort level of care.  We will give 1 dose of IV Lasix today.  We will send a respiratory viral panel, Legionella and strep  urinary antigen. ? ?Metabolic encephalopathy Secondary to hypercarbia.  Continue with BiPAP.  No plans for intubation at this time. ? ?Bronchiectasis with recent pneumonia.   ?Procalcitonin is negative at this time.  Chest x-ray shows effusion while CTA of the chest was negative for pulmonary embolism but left upper lobe infiltrate cardiomegaly, bilateral small pleural effusion..  Will empirically treat with vancomycin and cefepime for now.  De-escalate antibiotic as clinically warranted.  Blood cultures have been sent from the ED. we will give 1 dose of IV Lasix today. ? ?Deconditioning, debility,.  Patient with  recent prolonged hospitalization.  Would benefit from a palliative care follow-up during hospitalization.  Will consult palliative care.  Patient lives by herself at home at this time. ? ?Atrial fibrillation with RVR.  Currently tachycardic.  On Cardizem p.o. at home.  Might need IV if unable to take p.o.  Will resume.  Not a good anticoagulant candidate due to  history of GI bleed. ? ?Goals of care.  Patient appears to have declined significantly with frailty respiratory failure with worsening mental status.  We will consult palliative care.  DNR/DNI. ? ?

## 2022-04-15 NOTE — Progress Notes (Signed)
[place] following for Sepsis Protocol 

## 2022-04-15 NOTE — Telephone Encounter (Signed)
Noted  

## 2022-04-15 NOTE — Progress Notes (Signed)
CODE SEPSIS - PHARMACY COMMUNICATION ? ?**Broad Spectrum Antibiotics should be administered within 1 hour of Sepsis diagnosis** ? ?Time Code Sepsis Called/Page Received: 2011 ? ?Antibiotics Ordered: vancomycin, cefepime ? ?Time of 1st antibiotic administration: 1940 ? ? ? ?Sherilyn Banker ,PharmD ?Clinical Pharmacist  ?04/28/2022  8:20 PM ? ?

## 2022-04-15 NOTE — H&P (Addendum)
?History and Physical  ? ? ?Patient: Jackie Turner XTG:[id number] DOB: 1936/09/24 ?DOA: 04/29/2022 ?DOS: the patient was seen and examined on 04/25/2022 ?PCP: Virginia Crews, MD  ?Patient coming from: Home ? ?Chief Complaint:  ?Chief Complaint  ?Patient presents with  ? Shortness of Breath  ? ?HPI: ?Jackie Turner is a 86 y.o. female with past medical history of bronchiectasis, COPD, chronic hypoxic respiratory failure on home oxygen at 2 L/min, history of atrial fibrillation not on anticoagulation due to GI bleed presented to hospital with shortness of breath and difficulty breathing for last 1 day.  Of note patient was recently admitted to hospital and was discharged on 04/12/2022 after being treated for sepsis, atrial fibrillation with RVR, COPD exacerbation bronchiectasis and acute on chronic respiratory failure.  Patient was very somnolent and with increased work of breathing at the time of my evaluation but denied any chest pain, nausea or vomiting.  Patient's sister at bedside reported that she was coughing some but without as much sputum production.  Did not have any nausea vomiting or diarrhea at home.  History is limited due to patient's condition including respiratory distress.  On last admission patient was seen by palliative care but had refused palliative care services at that time.  ? ?In the ED, patient was noted to being in respiratory distress and was put on nasal cannula 4 L/min.  Patient was somnolent but answering intermittently.  He is seen to have increased work of breathing.  She was tachycardic but blood pressure was within normal range.  Initial labs showed a procalcitonin less than 0.1, lactate of 1.2.  WBC was elevated at 19.9.  ABG showed PCO2 of 80 with a pH of 7.3.  Patient was then considered for admission to the hospital for further evaluation and treatment. ? ? ?Assessment and Plan: ? ?Principal Problem: ?  Acute exacerbation of chronic obstructive pulmonary disease  (COPD) ([place]) ?Active Problems: ?  Acute on chronic respiratory failure with hypoxia and hypercapnia (HCC) ?  Hyperlipidemia ?  Asthma ?  Bronchiectasis ([place]) ?  COPD (chronic obstructive pulmonary disease) ([place]) ?  A-fib ([place]) ?  ?Acute chronic hypoxic and hypercarbic respiratory failure.   ?On oxygen at 2 L/min at baseline.  Has had recent hospitalization on 04/02/2022 and was recently discharged 04/12/2022 after prolonged hospitalization.  Patient was offered palliative care at the time but had refused.  Patient has come back to the hospital with increasing respiratory distress.  ABG today with PCO2 of 80 with pH of 7.3.  Patient appears to be somnolent at this time.  Spoke with the patient's sister at the bedside at length about her.  We will try BiPAP.  Patient had refused CPR and intubation last admission and continues to refuse at this time.  Prognosis of the patient is guarded at this time and overall outcome and prognosis is poor as well.  We will continue with IV Solu-Medrol, ventilatory support with BiPAP.  No plans for intubation or CPR at this time.  If patient continues to deteriorate family is willing to consider comfort level of care.  We will give 1 dose of IV Lasix today.  We will send a respiratory viral panel, Legionella and strep  urinary antigen. ? ?Metabolic encephalopathy Secondary to hypercarbia.  Continue with BiPAP.  No plans for intubation at this time. ? ?Bronchiectasis with recent pneumonia.   ?Procalcitonin is negative at this time.  Chest x-ray shows effusion while CTA of the chest was negative for  pulmonary embolism but left upper lobe infiltrate cardiomegaly, bilateral small pleural effusion..  Will empirically treat with vancomycin and cefepime for now.  De-escalate antibiotic as clinically warranted.  Blood cultures have been sent from the ED. we will give 1 dose of IV Lasix today. ? ?Deconditioning, debility,.  Patient with recent prolonged hospitalization.  Would benefit from a  palliative care follow-up during hospitalization.  Will consult palliative care.  Patient lives by herself at home at this time. ? ?Atrial fibrillation with RVR.  Currently tachycardic.  On Cardizem p.o. at home.  Might need IV if unable to take p.o.  Will resume.  Not a good anticoagulant candidate due to history of GI bleed. ? ?Goals of care.  Patient appears to have declined significantly with frailty respiratory failure with worsening mental status.  We will consult palliative care.  DNR/DNI. ?  ?Review of Systems: limited due to patient's condition. ? ?Past Medical History:  ?Diagnosis Date  ? Allergy   ? Asthma   ? Hyperlipidemia   ? Osteoporosis   ? ?Past Surgical History:  ?Procedure Laterality Date  ? ABDOMINAL HYSTERECTOMY  2003  ? [place]  ? benign  ? carpal tunnel repair Bilateral   ? R 1990; L 1997  ? COLONOSCOPY WITH PROPOFOL N/A 08/14/2020  ? Procedure: COLONOSCOPY WITH PROPOFOL;  Surgeon: Lucilla Lame, MD;  Location[facility] ENDOSCOPY;  Service: Endoscopy;  Laterality: N/A;  ? ESOPHAGOGASTRODUODENOSCOPY N/A 08/12/2020  ? Procedure: ESOPHAGOGASTRODUODENOSCOPY (EGD);  Surgeon: Lin Landsman, MD;  Location: [facility] ENDOSCOPY;  Service: Gastroenterology;  Laterality: N/A;  ? EYE SURGERY Bilateral   ? cataract extraction  ? HAND TENDON SURGERY Right   ? Trigger finger  ? IR RADIOLOGIST EVAL & MGMT  06/13/2017  ? IR VERTEBROPLASTY CERV/THOR BX INC UNI/BIL INC/INJECT/IMAGING  06/17/2017  ? TONSILLECTOMY  1961  ? ?Social History:  reports that she has never smoked. She has never used smokeless tobacco. She reports that she does not drink alcohol and does not use drugs. ? ?Allergies  ?Allergen Reactions  ? Nsaids Other (See Comments)  ?  History of gastric ulcer  ? Amoxicillin Nausea Only and Rash  ? Boniva [Ibandronic Acid] Other (See Comments)  ?  Muscle weakness - advised not to take ? ?**BONIVA** - drug name  ? Actonel  [Risedronate] Other (See Comments)  ?  Gastric ulcers  ? Antihistamines,  Chlorpheniramine-Type   ? Aspirin   ?  Gastric ulcer  ? Buspar [Buspirone] Other (See Comments)  ?  Pt does not remember reaction   ? Butalbital-Aspirin-Caffeine Other (See Comments)  ?  Other reaction(s): Unknown  ? Clarithromycin Other (See Comments)  ?  Bloating ? ?Binnie Rail*  ? Codeine   ? Gatifloxacin Other (See Comments)  ? Hydrocodone-Guaifenesin Nausea Only  ?  CODICLEAR [place]   ? Moxifloxacin   ? Oxycodone Other (See Comments)  ?  Dizziness  ? Penicillins Other (See Comments)  ? Risedronate Sodium   ?  Gastric ulcer  ? Tylenol With Codeine #3  [Acetaminophen-Codeine]   ?  Other reaction(s): Unknown  ? Azithromycin Other (See Comments)  ?  Pt states med causes severe abd pain  ? Raloxifene Other (See Comments)  ?  Bloating ?Bloating ? ?*EVISTA*  ? ? ?Family History  ?Problem Relation Age of Onset  ? Heart disease Mother   ? CAD Mother   ? Congestive Heart Failure Mother   ? Osteoarthritis Mother   ? Cancer Sister   ?  breast  ? Breast cancer Sister   ? ? ?Prior to Admission medications   ?Medication Sig Start Date End Date Taking? Authorizing Provider  ?albuterol (PROVENTIL) (2.5 MG/3ML) 0.083% nebulizer solution USE 3 ML BY NEBULIZATION EVERY 6 HOURS AS NEEDED FOR WHEEZING OR SHORTNESS OF BREATH 02/07/22   Tyler Pita, MD  ?Calcium Carb-Cholecalciferol 600-400 MG-UNIT TABS Take 2 tablets by mouth 2 (two) times daily.    [provider]  ?dicyclomine (BENTYL) 10 MG capsule TAKE 1 CAPSULE BY MOUTH 3 TIMES DAILY ASNEEDED FOR SPASMS 02/07/22   Bacigalupo, Dionne Bucy, MD  ?diltiazem (CARDIZEM CD) 360 MG 24 hr capsule Take 1 capsule (360 mg total) by mouth daily. 04/12/22 07/11/22  Enzo Bi, MD  ?doxepin (SINEQUAN) 10 MG capsule TAKE 3 CAPSULES BY MOUTH AT BEDTIME 01/18/22   Bacigalupo, Dionne Bucy, MD  ?feeding supplement, ENSURE ENLIVE, (ENSURE ENLIVE) LIQD Take 237 mLs by mouth 2 (two) times daily between meals. 06/16/20   Hongalgi, Lenis Dickinson, MD  ?ferrous gluconate (FERGON) 324 MG tablet Take 324 mg by  mouth daily with breakfast.    [provider]  ?fluticasone (FLONASE) 50 MCG/ACT nasal spray Place 2 sprays into both nostrils daily as needed. Home med. 04/12/22   Enzo Bi, MD  ?furosemide (L

## 2022-04-15 NOTE — Telephone Encounter (Signed)
?Chief Complaint: patient's friend, Ms. Field called to reports worsening sx since d/c from hospital 04/12/22 ?Symptoms: shortness of breath, sounds like grunting, on O2 at 4L/min [place] O2 sat at 95% HR 110. Ankles swelling and feet, abdomen larger in size since dx ileus. D/C from hospital 04/12/22. Patient now confused, hallucinating. ?Frequency: today  ?Pertinent Negatives: Patient denies na ?Disposition: '[x]'$ ED /'[]'$ Urgent Care (no appt availability in office) / '[]'$ Appointment(In office/virtual)/ '[]'$  [place] [facility]/ '[]'$ Home Care/ '[]'$ Refused Recommended Disposition /'[]'$ [place] [facility]/ '[]'$  Follow-up with PCP ?Additional Notes:  ? ?Recommended patient's friend to call 911 and notify family of patient's condition.  ? ? ? Reason for Disposition ? Difficult to awaken or acting confused (e.g., disoriented, slurred speech) ? ?Answer Assessment - Initial Assessment Questions ?1. MAIN CONCERN OR SYMPTOM: "What's the main symptom you're worried about?" (e.g., breathing problem, cough, sputum change, wheezing) "What question do you have?" ?    Shortness of breath sounds like grunting  ?2. ONSET: "When did the  sx  start?"  ?    Since leave hospital on Friday  ?3. BETTER-SAME-WORSE: "Are you getting better, staying the same, or getting worse compared to the day you were discharged?" ?    Worse  ?4. DISCHARGE DATE: "What date were you discharged from the hospital?" ?    04/12/22 ?5. DISCHARGE DOCTOR: "Who is the main doctor taking care of you now?" ?    na ?6. DISCHARGE APPOINTMENT: "Have you scheduled a follow-up discharge appointment with your doctor?" ?    na ?7. DISCHARGE MEDICATIONS: "Did the doctor who discharged you order any new medicines for you to use? If yes, have you filled the prescription and started taking the medicine?"  ?    na ?8. OXYGEN THERAPY:  ?  - "Do you currently use home oxygen?" (e.g., yes, no).  ?  - If Yes, ask: "What is your oxygen source?" (e.g., O2 tank, O2 concentrator).  ?  - If Yes, ask:  "How do you get the oxygen?" (e.g., nasal prongs, face mask).  ?  - If Yes, ask: "How much oxygen are you supposed to use?" (e.g., 1-2 L [place]) ?9: MONITORING "Do you monitor/measure your oxygen level or vital signs (e.g., yes, no, measurements are automatically sent to call center). Document current and normal values if available.   ?  -  O2 SAT: "What is the oxygen level (pulse ox reading)?" (e.g., 70-100%) ?  -  P: "What is your pulse rate per minute?" ?  -  RR: "What is your respiratory rate per minute?" ?    Currently on O2 at 4 L/min [place] ordered 2 L/min, O2 sat at 95% HR 110 ?10. BREATHING DIFFICULTY: "Are you having any trouble breathing?" If Yes, ask: "How bad is it?"  (e.g., none, mild, moderate, severe)  ? - MILD: No SOB at rest, mild SOB with walking, speaks normally in sentences, able to lie down, no retractions, pulse < 100.  ? - MODERATE: SOB at rest, SOB with minimal exertion and prefers to sit, cannot lie down flat, speaks in phrases, mild retractions, audible wheezing, pulse 100-120.  ? - SEVERE: Very SOB at rest, speaks in single words, struggling to breathe, sitting hunched forward, retractions, pulse > 120  ?      Confused hallucinating ?11. FEVER: "Do you have a fever?" If Yes, ask: "What is your temperature, how was it measured, and when did it start?" ?      na ?12. SPUTUM: "Describe the  color of your sputum (phlegm)." (clear, white, yellow, green, blood-tinged) ?      na ?13. OTHER SYMPTOMS: "Do you have any other symptoms?" (e.g., fever) ?      Ankles and feet swollen, abdomen swollen "like a basket ball" hx ileus hallucinations ?14. SMOKING: "Do you smoke currently?" ?      na ? ?Protocols used: COPD Post-Hospitalization Follow-up Call-A-AH ? ?

## 2022-04-15 NOTE — ED Provider Notes (Signed)
? ?[facility] ?Provider Note ? ? ? Event Date/Time  ? First MD Initiated Contact with Patient 04/28/2022 1510   ?  (approximate) ? ? ?History  ? ?Shortness of Breath ? ? ?HPI ? ?Jackie Turner is a 86 y.o. female who on review of palliative care note which I have reviewed from last week past medical history of bronchiectasis, pulmonary hypertension, chronic hypoxic and hypercapnic respiratory failure on 2 L oxygen at home, A-fib no longer on Eliquis due to GI bleed and chronic iron deficiency anemia, HLD, osteoporosis, abdominal hysterectomy in 2003,  ? ?Patient was discharged on April 28 but discharge summary is not yet complete for review ? ?Patient has had ongoing shortness of breath for several days since leaving the hospital and reports it seems her cough is coming back.  She is having a cough productive of brown sputum. ?  ? ? ?Physical Exam  ? ?Triage Vital Signs: ?ED Triage Vitals  ?Enc Vitals Group  ?   BP --   ?   Pulse Rate 05/03/2022 1505 (!) 106  ?   Resp 05/14/2022 1505 (!) 21  ?   Temp 05/05/2022 1509 98.2 ?F (36.8 ?C)  ?   Temp Source 05/08/2022 1509 Oral  ?   SpO2 04/16/2022 1505 96 %  ?   Weight 05/10/2022 1506 121 lb 11.1 oz (55.2 kg)  ?   Height 04/30/2022 1506 '4\' 9"'$  (1.448 m)  ?   Head Circumference --   ?   Peak Flow --   ?   Pain Score 04/28/2022 1506 0  ?   Pain Loc --   ?   Pain Edu? --   ?   Excl. in [place]? --   ? ? ?Most recent vital signs: ?Vitals:  ? 05/13/2022 1830 05/02/2022 1900  ?BP: (!) 108/43 108/62  ?Pulse: (!) 116 (!) 105  ?Resp: (!) 30 (!) 30  ?Temp:    ?SpO2: 95% 96%  ? ? ? ?General: Awake, appears moderately dyspneic with somewhat diminished lung sounds in the lower bases.  Speaks in phrases.  On 4 L oxygen ?CV:  Good peripheral perfusion.  Normal heart tones except slight tachycardia ?Resp:  Mild to moderate increased work of breathing.  Diminished lung sounds in the bases with poor aeration of the lower lung fields.  No noted crackles.  Mild expiratory wheezing on the  upper fields bilaterally. ?Abd:  No distention.  ?Other:  No edema of the lower extremity ? ? ?ED Results / Procedures / Treatments  ? ?Labs ?(all labs ordered are listed, but only abnormal results are displayed) ?Labs Reviewed  ?CBC WITH DIFFERENTIAL/PLATELET - Abnormal; Notable for the following components:  ?    Result Value  ? WBC 19.9 (*)   ? Hemoglobin 10.9 (*)   ? MCHC 29.6 (*)   ? RDW 16.4 (*)   ? Neutro Abs 17.8 (*)   ? Lymphs Abs 0.5 (*)   ? Monocytes Absolute 1.2 (*)   ? Abs Immature Granulocytes 0.18 (*)   ? All other components within normal limits  ?BASIC METABOLIC PANEL - Abnormal; Notable for the following components:  ? Chloride 94 (*)   ? CO2 39 (*)   ? Glucose, Bld 116 (*)   ? Calcium 8.7 (*)   ? All other components within normal limits  ?BLOOD GAS, VENOUS - Abnormal; Notable for the following components:  ? pCO2, Ven 80 (*)   ? pO2, Ven 46 (*)   ?  Bicarbonate 43.2 (*)   ? Acid-Base Excess 13.6 (*)   ? All other components within normal limits  ?TROPONIN I (HIGH SENSITIVITY) - Abnormal; Notable for the following components:  ? Troponin I (High Sensitivity) 25 (*)   ? All other components within normal limits  ?CULTURE, BLOOD (ROUTINE X 2)  ?CULTURE, BLOOD (ROUTINE X 2)  ?EXPECTORATED SPUTUM ASSESSMENT W GRAM STAIN, RFLX TO RESP C  ?RESPIRATORY PANEL BY PCR  ?MRSA NEXT GEN BY PCR, NASAL  ?PROCALCITONIN  ?LACTIC ACID, PLASMA  ?STREP PNEUMONIAE URINARY ANTIGEN  ?LEGIONELLA PNEUMOPHILA SEROGP 1 UR AG  ?CREATININE, SERUM  ?COMPREHENSIVE METABOLIC PANEL  ?CBC  ?MAGNESIUM  ?TROPONIN I (HIGH SENSITIVITY)  ? ? ? ?EKG ? ?Reviewed and interpreted by me at 1620 ?Heart rate 110 ?QRS 99 QTc 440.  Atrial flutter versus coarse A-fib.  Slight RVR.  No evidence of acute ischemia denoted mild nonspecific T wave abnormality ? ? ?RADIOLOGY ? ? ?DG Chest 2 View ? ?Result Date: 04/21/2022 ?CLINICAL DATA:  Shortness of breath EXAM: CHEST - 2 VIEW COMPARISON:  April 10, 2022 FINDINGS: The heart size and mediastinal  contours are partially obscured but appear enlarged unchanged. Aortic atherosclerosis. Marked elevation of left hemidiaphragm unchanged from prior. Small bilateral pleural effusions with adjacent airspace consolidations. Bilateral interstitial opacities. IMPRESSION: Partially obscured enlarged cardiac silhouette with small bilateral pleural effusions, adjacent airspace consolidations and bilateral interstitial opacities most likely reflecting pulmonary edema with pleural effusions. Electronically Signed   By: Dahlia Bailiff M.D.   On: 05/10/2022 16:16  ? ?CT Angio Chest PE W and/or Wo Contrast ? ?Result Date: 04/24/2022 ?CLINICAL DATA:  Shortness of breath EXAM: CT ANGIOGRAPHY CHEST WITH CONTRAST TECHNIQUE: Multidetector CT imaging of the chest was performed using the standard protocol during bolus administration of intravenous contrast. Multiplanar CT image reconstructions and MIPs were obtained to evaluate the vascular anatomy. RADIATION DOSE REDUCTION: This exam was performed according to the departmental dose-optimization program which includes automated exposure control, adjustment of the mA and/or kV according to patient size and/or use of iterative reconstruction technique. CONTRAST:  2m OMNIPAQUE IOHEXOL 350 MG/ML SOLN COMPARISON:  Chest x-ray 04/19/2022, CT chest 06/12/2020, CT 04/07/2022 FINDINGS: Cardiovascular: Satisfactory opacification of the pulmonary arteries to the segmental level. No evidence of pulmonary embolism. Significant respiratory motion artifact. Dilated pulmonary trunk up to 4.2 cm with enlarged main pulmonary arteries. Cardiomegaly. No pericardial effusion. Moderate aortic atherosclerosis. No aneurysm Mediastinum/Nodes: Midline trachea. No thyroid mass. No suspicious mediastinal adenopathy. Lungs/Pleura: Small bilateral pleural effusions. Chronic elevation of the left diaphragm with left lung base consolidation probably atelectasis. Mild bronchiectasis in the left lower lobe.  Heterogeneous airspace opacities in the apical left lower lobe and the right middle lobe with slightly nodular appearance of consolidation at the right middle lobe, series 7, image 52. Upper Abdomen: No acute abnormality. Upper abdominal and retroperitoneal adenopathy. Continued inflammatory process in the upper abdomen but limited by respiratory motion degradation. Musculoskeletal: Chronic compression deformity at T10 with post augmentation changes. Chronic mild superior endplate deformity at T9. Review of the MIP images confirms the above findings. IMPRESSION: 1. Respiratory motion artifact limits the exam. No definite acute pulmonary embolus is visualized. 2. Cardiomegaly with small bilateral effusions. Partial consolidation at the left lung base probably due to chronic atelectasis and elevation of left diaphragm. There are however heterogeneous airspace opacities in the apical left lower lobe with a somewhat nodular appearing area of consolidation at the right middle lobe potentially due to pneumonia though short interval CT  follow-up is recommended to ensure resolution. 3. Incompletely visualized inflammatory process in the upper abdomen, see CT from 04/23. Slightly progressive upper abdominal and retroperitoneal adenopathy 4. Dilated pulmonary trunk suggesting arterial hypertension. Aortic Atherosclerosis (ICD10-I70.0). Electronically Signed   By: Donavan Foil M.D.   On: 04/29/2022 17:30   ? ? ? ? ?PROCEDURES: ? ?Critical Care performed: Yes, see critical care procedure note(s) ? ?CRITICAL CARE ?Performed by: Delman Kitten ? ? ?Total critical care time: 35 minutes ? ?Critical care time was exclusive of separately billable procedures and treating other patients. ? ?Critical care was necessary to treat or prevent imminent or life-threatening deterioration. ? ?Critical care was time spent personally by me on the following activities: development of treatment plan with patient and/or surrogate as well as nursing,  discussions with consultants, evaluation of patient's response to treatment, examination of patient, obtaining history from patient or surrogate, ordering and performing treatments and interventions, ordering and review o

## 2022-04-15 NOTE — ED Triage Notes (Signed)
Pt to ED via [facility] from home for shortness of breath. Pt is on oxygen at home, pt normally wears 2 liters. Pt was on 4 liter when EMS arrived. Pt poor historian per ems. Pt states that she was told recently that she has a polyp on her lungs. Pt denied COPD to EMS but states that she does have COPD. Pt appears pale and has increased WOB.  ?

## 2022-04-16 ENCOUNTER — Encounter: Payer: Self-pay | Admitting: Internal Medicine

## 2022-04-16 DIAGNOSIS — Z880 Allergy status to penicillin: Secondary | ICD-10-CM | POA: Diagnosis not present

## 2022-04-16 DIAGNOSIS — Z881 Allergy status to other antibiotic agents status: Secondary | ICD-10-CM | POA: Diagnosis not present

## 2022-04-16 DIAGNOSIS — Z885 Allergy status to narcotic agent status: Secondary | ICD-10-CM | POA: Diagnosis not present

## 2022-04-16 DIAGNOSIS — M810 Age-related osteoporosis without current pathological fracture: Secondary | ICD-10-CM | POA: Diagnosis present

## 2022-04-16 DIAGNOSIS — K5981 Ogilvie syndrome: Secondary | ICD-10-CM | POA: Diagnosis present

## 2022-04-16 DIAGNOSIS — Z7951 Long term (current) use of inhaled steroids: Secondary | ICD-10-CM | POA: Diagnosis not present

## 2022-04-16 DIAGNOSIS — I110 Hypertensive heart disease with heart failure: Secondary | ICD-10-CM | POA: Diagnosis present

## 2022-04-16 DIAGNOSIS — Z7189 Other specified counseling: Secondary | ICD-10-CM

## 2022-04-16 DIAGNOSIS — Z66 Do not resuscitate: Secondary | ICD-10-CM | POA: Diagnosis present

## 2022-04-16 DIAGNOSIS — R54 Age-related physical debility: Secondary | ICD-10-CM | POA: Diagnosis present

## 2022-04-16 DIAGNOSIS — J441 Chronic obstructive pulmonary disease with (acute) exacerbation: Secondary | ICD-10-CM | POA: Diagnosis present

## 2022-04-16 DIAGNOSIS — E785 Hyperlipidemia, unspecified: Secondary | ICD-10-CM | POA: Diagnosis present

## 2022-04-16 DIAGNOSIS — Z515 Encounter for palliative care: Secondary | ICD-10-CM | POA: Diagnosis not present

## 2022-04-16 DIAGNOSIS — I272 Pulmonary hypertension, unspecified: Secondary | ICD-10-CM | POA: Diagnosis present

## 2022-04-16 DIAGNOSIS — I4891 Unspecified atrial fibrillation: Secondary | ICD-10-CM | POA: Diagnosis present

## 2022-04-16 DIAGNOSIS — Z79899 Other long term (current) drug therapy: Secondary | ICD-10-CM | POA: Diagnosis not present

## 2022-04-16 DIAGNOSIS — Z8249 Family history of ischemic heart disease and other diseases of the circulatory system: Secondary | ICD-10-CM | POA: Diagnosis not present

## 2022-04-16 DIAGNOSIS — Z888 Allergy status to other drugs, medicaments and biological substances status: Secondary | ICD-10-CM | POA: Diagnosis not present

## 2022-04-16 DIAGNOSIS — J9622 Acute and chronic respiratory failure with hypercapnia: Secondary | ICD-10-CM | POA: Diagnosis present

## 2022-04-16 DIAGNOSIS — I5033 Acute on chronic diastolic (congestive) heart failure: Secondary | ICD-10-CM | POA: Diagnosis present

## 2022-04-16 DIAGNOSIS — Z803 Family history of malignant neoplasm of breast: Secondary | ICD-10-CM | POA: Diagnosis not present

## 2022-04-16 DIAGNOSIS — J9621 Acute and chronic respiratory failure with hypoxia: Secondary | ICD-10-CM | POA: Diagnosis present

## 2022-04-16 DIAGNOSIS — Z886 Allergy status to analgesic agent status: Secondary | ICD-10-CM | POA: Diagnosis not present

## 2022-04-16 DIAGNOSIS — G9341 Metabolic encephalopathy: Secondary | ICD-10-CM | POA: Diagnosis present

## 2022-04-16 DIAGNOSIS — Z9981 Dependence on supplemental oxygen: Secondary | ICD-10-CM | POA: Diagnosis not present

## 2022-04-16 LAB — RESPIRATORY PANEL BY PCR

## 2022-04-16 LAB — COMPREHENSIVE METABOLIC PANEL
ALT: 26 U/L (ref 0–44)
AST: 19 U/L (ref 15–41)
Albumin: 3 g/dL — ABNORMAL LOW (ref 3.5–5.0)
Alkaline Phosphatase: 67 U/L (ref 38–126)
Anion gap: 8 (ref 5–15)
BUN: 19 mg/dL (ref 8–23)
CO2: 41 mmol/L — ABNORMAL HIGH (ref 22–32)
Calcium: 9 mg/dL (ref 8.9–10.3)
Chloride: 92 mmol/L — ABNORMAL LOW (ref 98–111)
Creatinine, Ser: 0.58 mg/dL (ref 0.44–1.00)
GFR, Estimated: >60 mL/min (ref >60)
Glucose, Bld: 167 mg/dL — ABNORMAL HIGH (ref 70–99)
Potassium: 3.9 mmol/L (ref 3.5–5.1)
Sodium: 141 mmol/L (ref 135–145)
Total Bilirubin: 0.5 mg/dL (ref 0.3–1.2)
Total Protein: 6.1 g/dL — ABNORMAL LOW (ref 6.5–8.1)

## 2022-04-16 LAB — STREP PNEUMONIAE URINARY ANTIGEN: Strep Pneumo Urinary Antigen: NEGATIVE

## 2022-04-16 LAB — CBC
HCT: 35.1 % — ABNORMAL LOW (ref 36.0–46.0)
Hemoglobin: 10.7 g/dL — ABNORMAL LOW (ref 12.0–15.0)
MCH: 27.2 pg (ref 26.0–34.0)
MCHC: 30.5 g/dL (ref 30.0–36.0)
MCV: 89.3 fL (ref 80.0–100.0)
Platelets: 365 10*3/uL (ref 150–400)
RBC: 3.93 MIL/uL (ref 3.87–5.11)
RDW: 16.4 % — ABNORMAL HIGH (ref 11.5–15.5)
WBC: 16.3 10*3/uL — ABNORMAL HIGH (ref 4.0–10.5)
nRBC: 0 % (ref 0.0–0.2)

## 2022-04-16 LAB — MRSA NEXT GEN BY PCR, NASAL: MRSA by PCR Next Gen: NOT DETECTED

## 2022-04-16 LAB — MAGNESIUM: Magnesium: 2 mg/dL (ref 1.7–2.4)

## 2022-04-16 MED ORDER — MORPHINE SULFATE (PF) 2 MG/ML IV SOLN: 2.0000 mg | INTRAVENOUS | Status: DC | PRN

## 2022-04-16 MED ORDER — MORPHINE SULFATE (PF) 2 MG/ML IV SOLN
2.0000 mg | INTRAVENOUS | Status: DC | PRN
  Administered 2022-04-16: 4 mg via INTRAVENOUS
  Administered 2022-04-16 – 2022-04-17 (×2): 2 mg via INTRAVENOUS
  Filled 2022-04-16 (×2): qty 1
  Filled 2022-04-16: qty 2
  Filled 2022-04-16: qty 1

## 2022-04-16 MED ORDER — HALOPERIDOL LACTATE 5 MG/ML IJ SOLN
0.5000 mg | INTRAMUSCULAR | Status: DC | PRN
  Administered 2022-04-17: 0.5 mg via INTRAVENOUS
  Filled 2022-04-16: qty 1

## 2022-04-16 MED ORDER — ONDANSETRON HCL 4 MG/2ML IJ SOLN: 4.0000 mg | Freq: Four times a day (QID) | INTRAMUSCULAR | Status: DC | PRN

## 2022-04-16 MED ORDER — LORAZEPAM 1 MG PO TABS
1.0000 mg | ORAL_TABLET | ORAL | Status: DC | PRN
  Administered 2022-04-17: 1 mg via ORAL
  Filled 2022-04-16: qty 1

## 2022-04-16 MED ORDER — GLYCOPYRROLATE 0.2 MG/ML IJ SOLN
0.2000 mg | INTRAMUSCULAR | Status: DC | PRN
  Filled 2022-04-16: qty 1

## 2022-04-16 MED ORDER — BIOTENE DRY MOUTH MT LIQD
15.0000 mL | OROMUCOSAL | Status: DC | PRN
  Filled 2022-04-16: qty 15

## 2022-04-16 MED ORDER — GLYCOPYRROLATE 0.2 MG/ML IJ SOLN
0.2000 mg | INTRAMUSCULAR | Status: DC | PRN
  Administered 2022-04-17: 0.2 mg via INTRAVENOUS
  Filled 2022-04-16 (×2): qty 1

## 2022-04-16 MED ORDER — HALOPERIDOL LACTATE 2 MG/ML PO CONC
0.5000 mg | ORAL | Status: DC | PRN
  Filled 2022-04-16: qty 5

## 2022-04-16 MED ORDER — ONDANSETRON 4 MG PO TBDP: 4.0000 mg | ORAL_TABLET | Freq: Four times a day (QID) | ORAL | Status: DC | PRN

## 2022-04-16 MED ORDER — GLYCOPYRROLATE 1 MG PO TABS
1.0000 mg | ORAL_TABLET | ORAL | Status: DC | PRN
  Filled 2022-04-16: qty 1

## 2022-04-16 MED ORDER — ACETAMINOPHEN 325 MG PO TABS: 650.0000 mg | ORAL_TABLET | Freq: Four times a day (QID) | ORAL | Status: DC | PRN

## 2022-04-16 MED ORDER — LORAZEPAM 2 MG/ML PO CONC: 1.0000 mg | ORAL | Status: DC | PRN

## 2022-04-16 MED ORDER — HALOPERIDOL 0.5 MG PO TABS
0.5000 mg | ORAL_TABLET | ORAL | Status: DC | PRN
  Filled 2022-04-16: qty 1

## 2022-04-16 MED ORDER — LORAZEPAM 2 MG/ML IJ SOLN
1.0000 mg | INTRAMUSCULAR | Status: DC | PRN
  Administered 2022-04-17 (×2): 1 mg via INTRAVENOUS
  Filled 2022-04-16 (×2): qty 1

## 2022-04-16 MED ORDER — MORPHINE SULFATE (CONCENTRATE) 10 MG/0.5ML PO SOLN: 5.0000 mg | ORAL | Status: DC | PRN

## 2022-04-16 MED ORDER — POLYVINYL ALCOHOL 1.4 % OP SOLN: 1.0000 [drp] | Freq: Four times a day (QID) | OPHTHALMIC | Status: DC | PRN

## 2022-04-16 NOTE — Progress Notes (Signed)
? ? ? ?Progress Note  ? ? ?Jackie Turner  OEV:[id number] DOB: January 12, 1936  DOA: 05/12/2022 ?PCP: Jackie Crews, MD  ? ? ? ? ?Brief Narrative:  ? ? ?Medical records reviewed and are as summarized below: ? ? ? ?Jackie Turner is a 86 y.o. female with past medical history of bronchiectasis, COPD, chronic hypoxic respiratory failure on home oxygen at 2 L/min, history of atrial fibrillation not on anticoagulation due to GI bleed presented to hospital with shortness of breath and difficulty breathing for last 1 day.  Of note patient was recently admitted to hospital and was discharged on 04/12/2022 after being treated for sepsis, atrial fibrillation with RVR, COPD exacerbation bronchiectasis and acute on chronic respiratory failure.  Patient was very somnolent and with increased work of breathing at the time of my evaluation but denied any chest pain, nausea or vomiting.  Patient's sister at bedside reported that she was coughing some but without as much sputum production.  Did not have any nausea vomiting or diarrhea at home.  History is limited due to patient's condition including respiratory distress.  On last admission patient was seen by palliative care but had refused palliative care services at that time.  ? ?In the ED, patient was noted to being in respiratory distress and was put on nasal cannula 4 L/min.  Patient was somnolent but answering intermittently.  He is seen to have increased work of breathing.  She was tachycardic but blood pressure was within normal range.  Initial labs showed a procalcitonin less than 0.1, lactate of 1.2.  WBC was elevated at 19.9.  ABG showed PCO2 of 80 with a pH of 7.3.  Patient was then considered for admission to the hospital for further evaluation and treatment. ? ? ?Assessment/Plan:  ? ?Principal Problem: ?  Acute exacerbation of chronic obstructive pulmonary disease (COPD) ([place]) ?Active Problems: ?  Acute on chronic respiratory failure with hypoxia and  hypercapnia ([facility]) ?  Hyperlipidemia ?  Asthma ?  Bronchiectasis ([place]) ?  COPD (chronic obstructive pulmonary disease) ([place]) ?  A-fib ([place]) ? ? ? ?Body mass index is 26.33 kg/m?. ? ? ?Acute on chronic hypoxic and hypercapnic respiratory failure ?Pneumonia ?COPD exacerbation ?Acute metabolic encephalopathy ?Bronchiectasis ?Small bilateral pleural effusions ?Atrial fibrillation with rapid ventricular response ?Deconditioning/debility ?Acute on chronic diastolic CHF ?History of asthma ? ? ?PLAN ? ?Patient was on BiPAP earlier in the day.  However, she requested BiPAP will be taken off.  Diagnoses, prognosis and goals of care was discussed with discussed with the patient, her 2 sisters, her niece, granddaughter and private caregiver at the bedside. ?They had further discussions with [name], NP, from palliative care team.  They have decided to proceed with comfort measures. ?Hospice team has been consulted to assist with disposition.   ?Hospital death is anticipated.  There is also a possibility that she may be discharged to hospice facility tomorrow. ? ? ?Diet Order   ? ?       ?  Diet NPO time specified  Diet effective now       ?  ? ?  ?  ? ?  ? ? ? ? ? ? ? ? ? ? ? ?Consultants: ?Palliative care ? ?Procedures: ?None ? ? ? ?Medications:  ? ? enoxaparin (LOVENOX) injection  40 mg Subcutaneous Q24H  ? methylPREDNISolone (SOLU-MEDROL) injection  40 mg Intravenous Q12H  ? pantoprazole  40 mg Oral BID  ? sodium chloride flush  3 mL Intravenous Q12H  ? ?  Continuous Infusions: ? sodium chloride    ? ceFEPime (MAXIPIME) IV Stopped (04/24/2022 2112)  ? diltiazem (CARDIZEM) infusion 5 mg/hr (04/16/22 0136)  ? ? ? ?Anti-infectives (From admission, onward)  ? ? Start     Dose/Rate Route Frequency Ordered Stop  ? 05-11-2022 2000  vancomycin (VANCOREADY) IVPB 1250 mg/250 mL  Status:  Discontinued       ? 1,250 mg ?166.7 mL/hr over 90 Minutes Intravenous Every 48 hours 05/11/2022 2022 04/16/22 0834  ? 04/22/2022 1915  ceFEPIme (MAXIPIME) 2 g  in sodium chloride 0.9 % 100 mL IVPB       ? 2 g ?200 mL/hr over 30 Minutes Intravenous Every 12 hours 04/20/2022 1903    ? 04/16/2022 1900  vancomycin (VANCOREADY) IVPB 1250 mg/250 mL       ? 1,250 mg ?166.7 mL/hr over 90 Minutes Intravenous  Once 05/01/2022 1858 05/13/2022 2244  ? ?  ? ? ? ? ? ? ? ? ? ?Family Communication/Anticipated D/C date and plan/Code Status  ? ?DVT prophylaxis: enoxaparin (LOVENOX) injection 40 mg Start: 05/03/2022 2200 ? ? ?  Code Status: DNR ? ?Family Communication: Plan discussed with her 2 sisters, her niece,  granddaughter and private caregiver at the bedside ?Disposition Plan: Plan to discharge to hospice facility in 1 to 2 days ? ? ?Status is: Inpatient ?Remains inpatient appropriate because: Comfort measures ? ? ? ? ? ? ? ? ?Subjective:  ? ?Interval events noted.  She complains of pain with urination, pain all over.  She is also a little confused and said that "they're trying to kill me".  Private caregiver at bedside.  Her private caregiver was at the bedside. ? ?Objective:  ? ? ?Vitals:  ? 04/16/22 0600 04/16/22 0630 04/16/22 0700 04/16/22 0800  ?BP: 97/60 103/64 (!) 102/59 99/65  ?Pulse: 91 94 (!) 101 (!) 103  ?Resp: (!) 27 (!) 25 (!) 28 (!) 29  ?Temp:      ?TempSrc:      ?SpO2: 99% 100% 99% 100%  ?Weight:      ?Height:      ? ?No data found. ? ? ?Intake/Output Summary (Last 24 hours) at 04/16/2022 0906 ?Last data filed at 04/16/2022 0144 ?Gross per 24 hour  ?Intake --  ?Output 1275 ml  ?Net -1275 ml  ? ?Filed Weights  ? 04/23/2022 1506  ?Weight: 55.2 kg  ? ? ?Exam: ? ?GEN: NAD ?SKIN: Warm and dry ?EYES: No pallor or icterus ?ENT: MMM ?CV: Irregular rate and rhythm, tachycardia ?PULM: CTA B ?ABD: soft, ND, NT, +BS ?CNS: Alert, intermittently confused, non focal ?EXT: No edema or tenderness ? ? ? ?  ? ? ?Data Reviewed:  ? ?I have personally reviewed following labs and imaging studies: ? ?Labs: ?Labs show the following:  ? ?Basic Metabolic Panel: ?Recent Labs  ?Lab 04/10/22 ?4709 04/11/22 ?0408  04/12/22 ?0622 05/12/2022 ?1512 04/16/22 ?6283  ?NA 135 136 136 140 141  ?K 3.6 3.9 3.9 4.4 3.9  ?CL 90* 95* 95* 94* 92*  ?CO2 34* 33* 33* 39* 41*  ?GLUCOSE 115* 99 115* 116* 167*  ?BUN 28* 22 24* 15 19  ?CREATININE 0.68 0.62 0.65 0.59 0.58  ?CALCIUM 9.0 8.5* 8.4* 8.7* 9.0  ?MG  --  2.2 2.2  --  2.0  ? ?GFR ?Estimated Creatinine Clearance: 37.4 mL/min (by C-G formula based on SCr of 0.58 mg/dL). ?Liver Function Tests: ?Recent Labs  ?Lab 04/16/22 ?0828  ?AST 19  ?ALT 26  ?ALKPHOS 67  ?BILITOT  0.5  ?PROT 6.1*  ?ALBUMIN 3.0*  ? ?No results for input(s): LIPASE, AMYLASE in the last 168 hours. ?No results for input(s): AMMONIA in the last 168 hours. ?Coagulation profile ?No results for input(s): INR, PROTIME in the last 168 hours. ? ?CBC: ?Recent Labs  ?Lab 04/10/22 ?1655 04/11/22 ?0408 04/12/22 ?0622 04/22/2022 ?1512 04/16/22 ?3748  ?WBC 16.5* 14.0* 12.1* 19.9* 16.3*  ?NEUTROABS  --   --   --  17.8*  --   ?HGB 12.1 12.2 12.1 10.9* 10.7*  ?HCT 37.6 38.8 38.5 36.8 35.1*  ?MCV 85.5 86.2 87.3 92.2 89.3  ?PLT 387 367 325 358 365  ? ?Cardiac Enzymes: ?No results for input(s): CKTOTAL, CKMB, CKMBINDEX, TROPONINI in the last 168 hours. ?BNP (last 3 results) ?No results for input(s): PROBNP in the last 8760 hours. ?CBG: ?No results for input(s): GLUCAP in the last 168 hours. ?D-Dimer: ?No results for input(s): DDIMER in the last 72 hours. ?Hgb A1c: ?No results for input(s): HGBA1C in the last 72 hours. ?Lipid Profile: ?No results for input(s): CHOL, HDL, LDLCALC, TRIG, CHOLHDL, LDLDIRECT in the last 72 hours. ?Thyroid function studies: ?No results for input(s): TSH, T4TOTAL, T3FREE, THYROIDAB in the last 72 hours. ? ?Invalid input(s): FREET3 ?Anemia work up: ?No results for input(s): VITAMINB12, FOLATE, FERRITIN, TIBC, IRON, RETICCTPCT in the last 72 hours. ?Sepsis Labs: ?Recent Labs  ?Lab 04/10/22 ?2707 04/11/22 ?0408 04/12/22 ?0622 04/28/2022 ?1512 05/02/2022 ?1704 04/16/22 ?8675  ?PROCALCITON <0.10  --   --  <0.10  --   --   ?WBC  16.5* 14.0* 12.1* 19.9*  --  16.3*  ?LATICACIDVEN  --   --   --   --  1.2  --   ? ? ?Microbiology ?Recent Results (from the past 240 hour(s))  ?Respiratory (~20 pathogens) panel by PCR     Status: None  ? Collec

## 2022-04-16 NOTE — ED Notes (Signed)
Palliative care at bedside.

## 2022-04-16 NOTE — Consult Note (Addendum)
? ?                                                                                ?Consultation Note ?Date: 04/16/2022  ? ?Patient Name: Jackie Turner  ?DOB: [birth year]  MRN: [id number]  Age / Sex: 86 y.o., female  ?PCP: Virginia Crews, MD ?Referring Physician: Jennye Boroughs, MD ? ?Reason for Consultation: Establishing goals of care ? ?HPI/Patient Profile: [name] is a 86 y.o. female with past medical history of bronchiectasis, COPD, chronic hypoxic respiratory failure on home oxygen at 2 L/min, history of atrial fibrillation not on anticoagulation due to GI bleed presented to hospital with shortness of breath and difficulty breathing for last 1 day.  Of note patient was recently admitted to hospital and was discharged on 04/12/2022 after being treated for sepsis, atrial fibrillation with RVR, COPD exacerbation bronchiectasis and acute on chronic respiratory failure.  Patient was very somnolent and with increased work of breathing at the time of my evaluation but denied any chest pain, nausea or vomiting.  Patient's sister at bedside reported that she was coughing some but without as much sputum production. ? ?Clinical Assessment and Goals of Care: ?Notes and labs reviewed. In to see patient. Patient is resting in bed on BIPAP with eyes closed. Patient's 2 sisters and other support is at bedside. They state she is widowed with no children. The 2 sisters are NOK. Attending is speaking with family to update and answer questions.  ? ?Upon his departure family discusses how independent she has always been, and that she does not like to have help. She has not been happy about having family members to help with things like groceries over the past few months. Family states prior to her last admission her stamina was such that she slept all day and did not feel like getting up and doing things she used to like bringing in groceries. A person who is present is a paid caregiver; the family has paid  caregivers to be at her house since her discharge a few days ago- but not around the clock as patient has wanted some level of privacy. They state her QOL has not been acceptable.  ? ?They discuss that she has been hallucinating and having vivid dreams at night over the past few months, talking to people. They state she has advised on her dress for the funeral and a few other things.  ? ?We discussed her diagnoses, prognosis, GOC, EOL wishes disposition and options. ? ?Created space and opportunity for patient  to explore thoughts and feelings regarding current medical information.  ? ?A detailed discussion was had today regarding advanced directives.  Concepts specific to code status, artifical feeding and hydration, IV antibiotics and rehospitalization were discussed.  The difference between an aggressive medical intervention path and a comfort care path was discussed.  Values and goals of care important to patient and family were attempted to be elicited. ? ?Discussed limitations of medical interventions to prolong quality of life in some situations and discussed the concept of human mortality. ? ?The family would like to shift to comfort focused care. They state when she has more energy than she  does at present, she tries to fight to remove the BIPAP and will try to fight personnel. Spoke with patient who states she just wants a place to quickly straighten out a few affairs and then to be shifted to comfort care. She is aware upon comfort care, she will be liberated from the BIPAP and will likely have a hospital death. She is amenable to this. Family begins calling an attorney per her wishes. Attending and ED staff made aware.    ? ?I completed a MOST form today and the form was signed by sister Harmon Pier at patient's request. The signed original was placed in the chart. A photocopy was placed in the chart to be scanned into EMR. The patient outlined their wishes for the following treatment  decisions: ? ?Cardiopulmonary Resuscitation: Do Not Attempt Resuscitation (DNR/No CPR)  ?Medical Interventions: Comfort Measures: Keep clean, warm, and dry. Use medication by any route, positioning, wound care, and other measures to relieve pain and suffering. Use oxygen, suction and manual treatment of airway obstruction as needed for comfort. Do not transfer to the hospital unless comfort needs cannot be met in current location.  ?Antibiotics: No antibiotics (use other measures to relieve symptoms)  ?IV Fluids: No IV fluids (provide other measures to ensure comfort)  ?Feeding Tube: No feeding tube  ?   ? ? ?ADDENDUM: In to see patient and nurse at bedside. Patient has been taken off BIPAP and has advised staff she is ready to shift to comfort. She has been talking to family giving them instructions. She advises she has finished with her attorney. She is oriented, and is very clear to me she is ready to shift to comfort care and be kept comfortable until death. She does not want any further life prolonging care. Sister is at bedside.  ? ? ? ?SUMMARY OF RECOMMENDATIONS   ? ?Continue current care until patient has handled the issues she has called her attorney to come to the hospital for, and then shift to comfort care when she is ready.  ? ?ADDENDUM: Shifting to comfort care.  ? ?Orders updated for comfort.  ?Attending team, please adjust orders if needed for patient comfort.  Please  initiate Morphine infusion if PRN Morphine is given more than 1 time per hour.  ? ?Anticipate hospital death. ? ?  ? ?Primary Diagnoses: ?Present on Admission: ? Acute on chronic respiratory failure with hypoxia and hypercapnia (HCC) ? A-fib ([place]) ? Asthma ? COPD (chronic obstructive pulmonary disease) ([place]) ? Hyperlipidemia ? Acute exacerbation of chronic obstructive pulmonary disease (COPD) ([place]) ? ? ?I have reviewed the medical record, interviewed the patient and family, and examined the patient. The following aspects are  pertinent. ? ?Past Medical History:  ?Diagnosis Date  ? Allergy   ? Asthma   ? Hyperlipidemia   ? Osteoporosis   ? ?Social History  ? ?Socioeconomic History  ? Marital status: Widowed  ?  Spouse name: [name]  ? Number of children: 0  ? Years of education: H/S  ? Highest education level: 12th grade  ?Occupational History  ? Occupation: Retired  ?Tobacco Use  ? Smoking status: Never  ? Smokeless tobacco: Never  ?Vaping Use  ? Vaping Use: Never used  ?Substance and Sexual Activity  ? Alcohol use: No  ? Drug use: No  ? Sexual activity: Never  ?Other Topics Concern  ? Not on file  ?Social History Narrative  ? Not on file  ? ?Social Determinants of Health  ? ?Emergency planning/management officer  Strain: Low Risk   ? Difficulty of Paying Living Expenses: Not hard at all  ?Food Insecurity: No Food Insecurity  ? Worried About Charity fundraiser in the Last Year: Never true  ? Ran Out of Food in the Last Year: Never true  ?Transportation Needs: No Transportation Needs  ? Lack of Transportation (Medical): No  ? Lack of Transportation (Non-Medical): No  ?Physical Activity: Inactive  ? Days of Exercise per Week: 0 days  ? Minutes of Exercise per Session: 0 min  ?Stress: No Stress Concern Present  ? Feeling of Stress : Not at all  ?Social Connections: Unknown  ? Frequency of Communication with Friends and Family: Not on file  ? Frequency of Social Gatherings with Friends and Family: Once a week  ? Attends Religious Services: Never  ? Active Member of Clubs or Organizations: No  ? Attends Archivist Meetings: Never  ? Marital Status: Widowed  ? ?Family History  ?Problem Relation Age of Onset  ? Heart disease Mother   ? CAD Mother   ? Congestive Heart Failure Mother   ? Osteoarthritis Mother   ? Cancer Sister   ?     breast  ? Breast cancer Sister   ? ?Scheduled Meds: ? enoxaparin (LOVENOX) injection  40 mg Subcutaneous Q24H  ? methylPREDNISolone (SOLU-MEDROL) injection  40 mg Intravenous Q12H  ? pantoprazole  40 mg Oral BID  ? sodium  chloride flush  3 mL Intravenous Q12H  ? ?Continuous Infusions: ? sodium chloride    ? ceFEPime (MAXIPIME) IV Stopped (05/14/2022 2112)  ? diltiazem (CARDIZEM) infusion 5 mg/hr (04/16/22 0136)  ? ?PRN Meds:.sodium chloride, acetaminophen, flu

## 2022-04-16 NOTE — ED Notes (Signed)
Lawyer and notary at bedside ?

## 2022-04-16 NOTE — ED Notes (Signed)
Pt's sisters, caretaker, and niece at bedside, MD made aware of this so further conversation can be made for pt's plan of care.  ?

## 2022-04-16 NOTE — ED Notes (Signed)
Pt requesting to be taken off bi-pap due to all orders be in place from her standpoint. Palliative care NP made aware and will be placing comfort care orders.  ? ?Pt placed on 4L/min. Pt given water. This RN assisted with pt's lips. Pt given lemon glycerin swabs to assist with dry mouth and lips. Sister at bedside.  ?

## 2022-04-16 NOTE — ED Notes (Signed)
Family and pt refusing ABG and trialing pt off of bi-pap due to attorney coming to help with finalizing final wishes and transitioning pt to comfort care. MD aware.  ?

## 2022-04-17 ENCOUNTER — Inpatient Hospital Stay: Payer: Medicare HMO | Admitting: Physician Assistant

## 2022-04-17 ENCOUNTER — Encounter: Payer: Self-pay | Admitting: Internal Medicine

## 2022-04-17 DIAGNOSIS — J441 Chronic obstructive pulmonary disease with (acute) exacerbation: Secondary | ICD-10-CM | POA: Diagnosis not present

## 2022-04-17 DIAGNOSIS — Z515 Encounter for palliative care: Secondary | ICD-10-CM | POA: Diagnosis not present

## 2022-04-17 DIAGNOSIS — Z7189 Other specified counseling: Secondary | ICD-10-CM | POA: Diagnosis not present

## 2022-04-17 LAB — LEGIONELLA PNEUMOPHILA SEROGP 1 UR AG: L. pneumophila Serogp 1 Ur Ag: NEGATIVE

## 2022-04-17 MED ORDER — GLYCOPYRROLATE 0.2 MG/ML IJ SOLN
0.2000 mg | INTRAMUSCULAR | Status: DC | PRN
Start: 2022-04-17 — End: 2022-04-18
  Administered 2022-04-17: 0.2 mg via INTRAVENOUS
  Filled 2022-04-17: qty 1

## 2022-04-17 MED ORDER — GLYCOPYRROLATE 0.2 MG/ML IJ SOLN: 0.2000 mg | INTRAMUSCULAR | Status: DC | PRN

## 2022-04-17 MED ORDER — GLYCOPYRROLATE 1 MG PO TABS
1.0000 mg | ORAL_TABLET | ORAL | Status: DC | PRN
  Filled 2022-04-17: qty 1

## 2022-04-17 MED ORDER — MORPHINE 100MG IN NS 100ML (1MG/ML) PREMIX INFUSION
1.0000 mg/h | INTRAVENOUS | Status: DC
  Administered 2022-04-17: 1 mg/h via INTRAVENOUS
  Filled 2022-04-17: qty 100

## 2022-04-17 MED ORDER — MORPHINE BOLUS VIA INFUSION
2.0000 mg | INTRAVENOUS | Status: DC | PRN
  Administered 2022-04-17: 2 mg via INTRAVENOUS
  Filled 2022-04-17: qty 4

## 2022-04-17 MED ORDER — MORPHINE 100MG IN NS 100ML (1MG/ML) PREMIX INFUSION
3.0000 mg/h | INTRAVENOUS | Status: DC
  Administered 2022-04-17: 3 mg/h via INTRAVENOUS

## 2022-04-17 MED ORDER — GLYCOPYRROLATE 0.2 MG/ML IJ SOLN
0.2000 mg | INTRAMUSCULAR | Status: DC | PRN
  Administered 2022-04-17: 0.2 mg via INTRAVENOUS
  Filled 2022-04-17: qty 1

## 2022-04-23 NOTE — Discharge Summary (Addendum)
Death Summary  Jackie Turner SHU:[id number] DOB: 1936-07-08 DOA: 30-Apr-2022  PCP: Virginia Crews, MD PCP/Office notified: 5/4  Admit date: 04-30-22 Date of Death: 05-02-22  Final Diagnoses:  Principal Problem:   Acute exacerbation of chronic obstructive pulmonary disease (COPD) ([place]) Active Problems:   Acute on chronic respiratory failure with hypoxia and hypercapnia (HCC)   Hyperlipidemia   Asthma   Bronchiectasis (HCC)   COPD (chronic obstructive pulmonary disease) ([place])   A-fib ([place])    All interventions transition to full comfort measures.  Morphine gtt., Ativan as needed, Robinul as needed.  2 L oxygen via nasal cannula for patient comfort only.  Can escalate to morphine gtt. or use Ativan as needed for shortness of breath or air hunger.  Hospice team and palliative care engaged.  Hospital death is anticipated however if patient stabilizes she may be appropriate for transition to inpatient hospice facility.  History of present illness:  86 y.o. female with past medical history of bronchiectasis, COPD, chronic hypoxic respiratory failure on home oxygen at 2 L/min, history of atrial fibrillation not on anticoagulation due to GI bleed presented to hospital with shortness of breath and difficulty breathing for last 1 day.  Of note patient was recently admitted to hospital and was discharged on 04/12/2022 after being treated for sepsis, atrial fibrillation with RVR, COPD exacerbation bronchiectasis and acute on chronic respiratory failure.  Patient was very somnolent and with increased work of breathing at the time of my evaluation but denied any chest pain, nausea or vomiting.  Patient's sister at bedside reported that she was coughing some but without as much sputum production.  Did not have any nausea vomiting or diarrhea at home.  History is limited due to patient's condition including respiratory distress.  On last admission patient was seen by palliative care but had refused  palliative care services at that time.   Hospital Course:  In the ED, patient was noted to being in respiratory distress and was put on nasal cannula 4 L/min.  Patient was somnolent but answering intermittently.  He is seen to have increased work of breathing.  She was tachycardic but blood pressure was within normal range.  Initial labs showed a procalcitonin less than 0.1, lactate of 1.2.  WBC was elevated at 19.9.  ABG showed PCO2 of 80 with a pH of 7.3.  Patient was then considered for admission to the hospital for further evaluation and treatment.   Patient has been transition to full comfort measures as of 5/2.  On 03-May-2023 morphine GTT was initiated.  Palliative care has been following.  On May 03, 2023 at 03-14-34 patient passed peacefully with to get caregivers at bedside.  Death pronounced by 2 RNs.  See flowsheet for details.  Family notified of patient's passing.  Patient resting these.   Time of death 03/14/34 on 05-03-2023  Signed:  Sidney Ace  [facility] 04/23/2022, 9:53 AM

## 2022-05-16 NOTE — TOC Progression Note (Signed)
Transition of Care (TOC) - Progression Note  ? ? ?Patient Details  ?Name: Jackie Turner ?MRN: [id number] ?Date of Birth: [birth year] ? ?Transition of Care (TOC) CM/SW Contact  ?Pete Pelt, RN ?Phone Number: ?May 07, 2022, 1:46 PM ? ?Clinical Narrative:   would like home with hospice or hospice facility.  On morphine gtt.  May be in hospital passing. ? ? ? ?  ?  ? ?Expected Discharge Plan and Services ?  ?  ?  ?  ?  ?                ?  ?  ?  ?  ?  ?  ?  ?  ?  ?  ? ? ?Social Determinants of Health (SDOH) Interventions ?  ? ?Readmission Risk Interventions ?   ? View : No data to display.  ?  ?  ?  ? ? ?

## 2022-05-16 NOTE — Progress Notes (Addendum)
? ?                                                                                                                                                     ?                                                   ?Daily Progress Note  ? ?Patient Name: Jackie Turner       Date: 05/13/2022 ?DOB: 03-22-1936  Age: 86 y.o. MRN#: [id number] ?Attending Physician: Sidney Ace, MD ?Primary Care Physician: Virginia Crews, MD ?Admit Date: 05/11/2022 ? ?Reason for Consultation/Follow-up: Establishing goals of care and Terminal Care ? ?Subjective:  ?Notes reviewed. In to see patient. She is resting in bed with sisters at bedside. Patient currently has a morphine infusion in place at '1mg'$ /hr, and O2 by [place] at 4 lpm. Patient states she is feeling good at this time. She states she has eaten a bit of apple sauce and drank water upon taking medications. Family states she has expressed several times she is ready to go, that she is tired. She agreed with this.  ? ?We discuss care moving forward and a focus on comfort. She discusses wanting to go to her home if possible as "there is no place like home". We discuss that she would need help at home and we discuss continuing to use the paid caregiver company they had hired last discharge. We discuss symptom management. We discuss her O2 and end of life. She is clear she wants nothing holding her on earth, and wants to be as comfortable as possible. She states she would like her nasal cannula removed. We discuss home with hospice vs hospice facility vs remaining in the hospital depending on her needs. Soon after removing nasal cannula she began to speak in broken sentences and said she was feeling a little warm and a little short of breath. Spoke with Agricultural consultant. Morphine infusion was increased to '3mg'$ /hr, and PRN bolus doses were added. Bedside nurse in to check on patient and we discussed escalating needs. Patient both appeared and stated she was comfortable prior to my departure, and  family discussed being grateful for her care on my departure. TOC and attending upated.  ? ? ?Length of Stay: 1 ? ?Current Medications: ?Scheduled Meds:  ? pantoprazole  40 mg Oral BID  ? ? ?Continuous Infusions: ? morphine 3 mg/hr (13-May-2022 1228)  ? ? ?PRN Meds: ?acetaminophen, antiseptic oral rinse, fluticasone, glycopyrrolate **OR** glycopyrrolate **OR** glycopyrrolate, haloperidol **OR** haloperidol **OR** haloperidol lactate, ipratropium-albuterol, LORazepam **OR** LORazepam **OR** LORazepam, morphine, ondansetron **OR** ondansetron (ZOFRAN) IV, polyvinyl alcohol ? ?Physical Exam ?Pulmonary:  ?   Effort:  Pulmonary effort is normal.  ?Neurological:  ?   Mental Status: She is alert.  ?         ? ?Vital Signs: BP 115/81 (BP Location: Left Arm)   Pulse (!) 106   Temp (!) 97.5 ?F (36.4 ?C) (Oral)   Resp 20   Ht 5' (1.524 m)   Wt 51.7 kg   SpO2 94%   BMI 22.26 kg/m?  ?SpO2: SpO2: 94 % ?O2 Device: O2 Device: Nasal Cannula ?O2 Flow Rate: O2 Flow Rate (L/min): 4 L/min ? ?Intake/output summary:  ?Intake/Output Summary (Last 24 hours) at May 10, 2022 1348 ?Last data filed at 05-10-2022 0400 ?Gross per 24 hour  ?Intake 0 ml  ?Output --  ?Net 0 ml  ? ?LBM: Last BM Date : 05/09/2022 ?Baseline Weight: Weight: 55.2 kg ?Most recent weight: Weight: 51.7 kg ? ? ? ?Patient Active Problem List  ? Diagnosis Date Noted  ? Acute exacerbation of chronic obstructive pulmonary disease (COPD) ([place]) 05/08/2022  ? 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 History of IBS 04/09/2022  ? Constipation 04/09/2022  ? Gastroduodenitis 04/08/2022  ? Terminal ileitis ([place]) 04/08/2022  ? Pulmonary nodule 04/08/2022  ? Hyperkalemia 04/07/2022  ? Compression fracture of T10 vertebra ([place]) 04/06/2022  ? Abdominal pain 04/05/2022  ? Sepsis ([place]) 04/02/2022  ? Right lower lobe pneumonia 04/02/2022  ? Insomnia 11/19/2021  ? Primary hypertension 06/05/2021  ? Atrial fibrillation with rapid ventricular response ([place]) 03/20/2021  ? Microcytic anemia 03/19/2021  ? Iron  deficiency 03/19/2021  ? A-fib ([place]) 03/19/2021  ? Memory loss 11/21/2020  ? Polyp of colon   ? Acute on chronic respiratory failure with hypoxia and hypercapnia ([place]) 08/09/2020  ? Acute blood loss anemia 08/09/2020  ? GI bleeding 08/04/2020  ? Depression, major, single episode, in partial remission ([place]) 06/22/2020  ? Atrial fibrillation with RVR ([place]) 06/13/2020  ? Chronic respiratory failure with hypoxia ([place]) 06/13/2020  ? Right hand pain 06/13/2020  ? Fall at home, initial encounter 06/13/2020  ? COPD (chronic obstructive pulmonary disease) ([place]) 06/13/2020  ? Bronchiectasis ([place]) 06/01/2020  ? Lymphedema 05/17/2019  ? Chronic venous insufficiency 05/17/2019  ? Swelling of limb 02/10/2018  ? Varicose veins of leg with swelling, bilateral 02/10/2018  ? Squamous cell carcinoma 09/22/2017  ? Hypoxia 09/27/2015  ? Abnormal CXR 09/27/2015  ? Asthma with allergic rhinitis 08/31/2015  ? Back ache 08/31/2015  ? Chronic interstitial cystitis 08/31/2015  ? Grief reaction 08/31/2015  ? Hyperlipidemia 06/26/2015  ? Migraine 06/26/2015  ? Asthma 06/26/2015  ? IBS (irritable bowel syndrome) 06/26/2015  ? Osteoporosis 06/26/2015  ? Fatigue 06/26/2015  ? Lumbar radiculopathy 04/27/2013  ? Urinary urgency 01/08/2012  ? Frequency of urination 01/08/2012  ? ? ?Palliative Care Assessment & Plan  ? ?Recommendations/Plan: ? Attending team please adjust symptom management as needed for patient comfort. Anticipate possible escalation in symptoms and needs. ?Morphine infusion dosing has been increased, and PRN boluses via infusion were added. Instructions were placed in both bolus and infusion orders that if patient receives 3 boluses within 1 hour to increase Morphine infusion by 25%.  ?Ativan is in place for anxiety.  ?Haldol for agitation.  ?Robinul is in place for excessive secretions ?Consider hospice facility vs home with hospice vs remaining in the hospital depending on patient needs.  ? ?Code Status: ? ?  ?Code Status Orders   ?(From admission, onward)  ?  ? ? ?  ? ?  Start     Ordered  ? 04/16/22 1619  Do not attempt resuscitation (DNR)  Continuous       ?Question Answer Comment  ?In the event of cardiac or respiratory ARREST Do not call a ?code blue?   ?In the event of cardiac or respiratory ARREST Do not perform Intubation, CPR, defibrillation or ACLS   ?In the event of cardiac or respiratory ARREST Use medication by any route, position, wound care, and other measures to relive pain and suffering. May use oxygen, suction and manual treatment of airway obstruction as needed for comfort.   ?Comments MOST form on chart   ?  ? 04/16/22 1622  ? ?  ?  ? ?  ? ?Code Status History   ? ? Date Active Date Inactive Code Status Order ID Comments User Context  ? 04/26/2022 1834 04/16/2022 1622 DNR [id number]  Flora Lipps, MD ED  ? 04/10/2022 1134 04/12/2022 1736 DNR [id number]  Drue Novel, NP Inpatient  ? 04/02/2022 0022 04/10/2022 1134 Full Code [id number]  Athena Masse, MD ED  ? 03/19/2021 1911 03/21/2021 2043 Full Code [id number]  Clance Boll, MD ED  ? 08/04/2020 2316 08/15/2020 2145 Full Code [id number]  Mansy, Arvella Merles, MD ED  ? 06/13/2020 0125 06/15/2020 2216 Full Code [id number]  Athena Masse, MD ED  ? ?  ? ? ?Prognosis: ? < 2 weeks ? ? ?Care plan was discussed with MD and TOC.  ? ?Thank you for allowing the Palliative Medicine Team to assist in the care of this patient. ? ?Asencion Gowda, NP ? ?Please contact Palliative Medicine Team phone at [phone] for questions and concerns.  ? ? ? ? ? ?

## 2022-05-16 NOTE — Progress Notes (Signed)
? ?      CROSS COVER NOTE ? ?NAME: Jackie Turner ?MRN: [id number] ?DOB : [birth year] ? ? ? ?Notified by RN that patient has expired at 2235 ? ?Patient was DNR and comfort care ? ?2 RN verified. ? ?Family/caregiver was at bedside and available to RN. ? ? ?Neomia Glass MHA, MSN, FNP-BC ?Nurse Practitioner ?[facility] ?[place] ?Pager [phone] ? ? ? ? ?

## 2022-05-16 NOTE — Progress Notes (Signed)
Pt on comfort care. Morphine infusing per order. Respirations irregular but unlabored. Multiple family members present at start of shift then only 2 caregivers/friends stayed at bedside.  ?Purewick leaked and bed wet. Pt cleaned and linens changed by RN and NT. Pt unresponsive during change but respirations remain unlabored.  ? ?Two caregivers at bedside when patient passed at 03/13/34. Death pronounced by 2 RNs. See flowsheets. This RN called and notified patient's sister Harmon Pier of patient's death. [phone]) ? ? ? ? ?

## 2022-05-16 NOTE — Progress Notes (Signed)
?PROGRESS NOTE ? ? ? ?Jackie Turner  IRW:[id number] DOB: [birth year] DOA: 05/01/2022 ?PCP: Virginia Crews, MD  ? ? ?Brief Narrative:  ?86 y.o. female with past medical history of bronchiectasis, COPD, chronic hypoxic respiratory failure on home oxygen at 2 L/min, history of atrial fibrillation not on anticoagulation due to GI bleed presented to hospital with shortness of breath and difficulty breathing for last 1 day.  Of note patient was recently admitted to hospital and was discharged on 04/12/2022 after being treated for sepsis, atrial fibrillation with RVR, COPD exacerbation bronchiectasis and acute on chronic respiratory failure.  Patient was very somnolent and with increased work of breathing at the time of my evaluation but denied any chest pain, nausea or vomiting.  Patient's sister at bedside reported that she was coughing some but without as much sputum production.  Did not have any nausea vomiting or diarrhea at home.  History is limited due to patient's condition including respiratory distress.  On last admission patient was seen by palliative care but had refused palliative care services at that time.  ?  ?In the ED, patient was noted to being in respiratory distress and was put on nasal cannula 4 L/min.  Patient was somnolent but answering intermittently.  He is seen to have increased work of breathing.  She was tachycardic but blood pressure was within normal range.  Initial labs showed a procalcitonin less than 0.1, lactate of 1.2.  WBC was elevated at 19.9.  ABG showed PCO2 of 80 with a pH of 7.3.  Patient was then considered for admission to the hospital for further evaluation and treatment. ? ?Patient has been transition to full comfort measures as of 5/2.  On 5/3 morphine GTT was initiated.  Palliative care has been following ? ? ?Assessment & Plan: ?  ?Principal Problem: ?  Acute exacerbation of chronic obstructive pulmonary disease (COPD) ([place]) ?Active Problems: ?  Acute on chronic  respiratory failure with hypoxia and hypercapnia ([facility]) ?  Hyperlipidemia ?  Asthma ?  Bronchiectasis ([place]) ?  COPD (chronic obstructive pulmonary disease) ([place]) ?  A-fib ([place]) ? ?All interventions transition to full comfort measures.  Morphine gtt., Ativan as needed, Robinul as needed.  2 L oxygen via nasal cannula for patient comfort only.  Can escalate to morphine gtt. or use Ativan as needed for shortness of breath or air hunger.  Hospice team and palliative care engaged.  Hospital death is anticipated however if patient stabilizes she may be appropriate for transition to inpatient hospice facility. ? ? ?DVT prophylaxis: Comfort ?Code Status: DNR ?Family Communication: Personal caregiver at bedside 5/3 ?Disposition Plan: Status is: Inpatient ?Remains inpatient appropriate because: Comfort measures on morphine gtt. ? ? ?Level of care: Palliative Care ? ?Consultants:  ?Palliative care ? ?Procedures:  ?None ? ?Antimicrobials: ?None ? ? ?Subjective: ?Seen and examined.  Appears uncomfortable ? ?Objective: ?Vitals:  ? 04/16/22 1930 04/16/22 2012 04/16/22 2342 May 08, 2022 0732  ?BP: 110/70 120/68 113/75 115/81  ?Pulse: (!) 118 (!) 119 (!) 107 (!) 106  ?Resp: (!) '26 18 20 20  '$ ?Temp: 98.3 ?F (36.8 ?C) 98.7 ?F (37.1 ?C) 98.2 ?F (36.8 ?C) (!) 97.5 ?F (36.4 ?C)  ?TempSrc: Oral Oral Oral Oral  ?SpO2: 96% 94% 98% 94%  ?Weight:  51.7 kg    ?Height:  5' (1.524 m)    ? ? ?Intake/Output Summary (Last 24 hours) at 05-08-22 1506 ?Last data filed at May 08, 2022 0400 ?Gross per 24 hour  ?Intake 0 ml  ?Output --  ?  Net 0 ml  ? ?Filed Weights  ? 04/18/2022 1506 04/16/22 2012  ?Weight: 55.2 kg 51.7 kg  ? ? ?Examination: ? ?Limited exam due to comfort measure status ? ?General exam: Mildly uncomfortable.  Frail-appearing ?Respiratory system: Diffusely rhonchorous bilaterally.  Mildly tachypneic.  Normal work of breathing.  4 L ?Cardiovascular system: S1-S2, RRR, no murmurs, no pedal edema ? ? ? ?Data Reviewed: I have personally reviewed following  labs and imaging studies ? ?CBC: ?Recent Labs  ?Lab 04/11/22 ?0408 04/12/22 ?0622 04/25/2022 ?1512 04/16/22 ?3646  ?WBC 14.0* 12.1* 19.9* 16.3*  ?NEUTROABS  --   --  17.8*  --   ?HGB 12.2 12.1 10.9* 10.7*  ?HCT 38.8 38.5 36.8 35.1*  ?MCV 86.2 87.3 92.2 89.3  ?PLT 367 325 358 365  ? ?Basic Metabolic Panel: ?Recent Labs  ?Lab 04/11/22 ?0408 04/12/22 ?0622 05/03/2022 ?1512 04/16/22 ?8032  ?NA 136 136 140 141  ?K 3.9 3.9 4.4 3.9  ?CL 95* 95* 94* 92*  ?CO2 33* 33* 39* 41*  ?GLUCOSE 99 115* 116* 167*  ?BUN 22 24* 15 19  ?CREATININE 0.62 0.65 0.59 0.58  ?CALCIUM 8.5* 8.4* 8.7* 9.0  ?MG 2.2 2.2  --  2.0  ? ?GFR: ?Estimated Creatinine Clearance: 37.6 mL/min (by C-G formula based on SCr of 0.58 mg/dL). ?Liver Function Tests: ?Recent Labs  ?Lab 04/16/22 ?0828  ?AST 19  ?ALT 26  ?ALKPHOS 67  ?BILITOT 0.5  ?PROT 6.1*  ?ALBUMIN 3.0*  ? ?No results for input(s): LIPASE, AMYLASE in the last 168 hours. ?No results for input(s): AMMONIA in the last 168 hours. ?Coagulation Profile: ?No results for input(s): INR, PROTIME in the last 168 hours. ?Cardiac Enzymes: ?No results for input(s): CKTOTAL, CKMB, CKMBINDEX, TROPONINI in the last 168 hours. ?BNP (last 3 results) ?No results for input(s): PROBNP in the last 8760 hours. ?HbA1C: ?No results for input(s): HGBA1C in the last 72 hours. ?CBG: ?No results for input(s): GLUCAP in the last 168 hours. ?Lipid Profile: ?No results for input(s): CHOL, HDL, LDLCALC, TRIG, CHOLHDL, LDLDIRECT in the last 72 hours. ?Thyroid Function Tests: ?No results for input(s): TSH, T4TOTAL, FREET4, T3FREE, THYROIDAB in the last 72 hours. ?Anemia Panel: ?No results for input(s): VITAMINB12, FOLATE, FERRITIN, TIBC, IRON, RETICCTPCT in the last 72 hours. ?Sepsis Labs: ?Recent Labs  ?Lab 04/16/2022 ?1512 04/19/2022 ?1704  ?PROCALCITON <0.10  --   ?LATICACIDVEN  --  1.2  ? ? ?Recent Results (from the past 240 hour(s))  ?Respiratory (~20 pathogens) panel by PCR     Status: None  ? Collection Time: 04/16/22  1:37 AM  ?  Specimen: Nasopharyngeal Swab; Respiratory  ?Result Value Ref Range Status  ? Adenovirus NOT DETECTED NOT DETECTED Final  ? Coronavirus 229E NOT DETECTED NOT DETECTED Final  ?  Comment: (NOTE) ?The Coronavirus on the Respiratory Panel, DOES NOT test for the novel  ?Coronavirus (2019 nCoV) ?  ? Coronavirus HKU1 NOT DETECTED NOT DETECTED Final  ? Coronavirus NL63 NOT DETECTED NOT DETECTED Final  ? Coronavirus OC43 NOT DETECTED NOT DETECTED Final  ? Metapneumovirus NOT DETECTED NOT DETECTED Final  ? Rhinovirus / Enterovirus NOT DETECTED NOT DETECTED Final  ? Influenza A NOT DETECTED NOT DETECTED Final  ? Influenza B NOT DETECTED NOT DETECTED Final  ? Parainfluenza Virus 1 NOT DETECTED NOT DETECTED Final  ? Parainfluenza Virus 2 NOT DETECTED NOT DETECTED Final  ? Parainfluenza Virus 3 NOT DETECTED NOT DETECTED Final  ? Parainfluenza Virus 4 NOT DETECTED NOT DETECTED Final  ? Respiratory  Syncytial Virus NOT DETECTED NOT DETECTED Final  ? Bordetella pertussis NOT DETECTED NOT DETECTED Final  ? Bordetella Parapertussis NOT DETECTED NOT DETECTED Final  ? Chlamydophila pneumoniae NOT DETECTED NOT DETECTED Final  ? Mycoplasma pneumoniae NOT DETECTED NOT DETECTED Final  ?  Comment: Performed at [facility] [address], [place], [place] [address]  ?MRSA Next Gen by PCR, Nasal     Status: None  ? Collection Time: 04/16/22  1:37 AM  ? Specimen: Nasal Mucosa; Nasal Swab  ?Result Value Ref Range Status  ? MRSA by PCR Next Gen NOT DETECTED NOT DETECTED Final  ?  Comment: (NOTE) ?The GeneXpert MRSA Assay (FDA approved for NASAL specimens only), ?is one component of a comprehensive MRSA colonization surveillance ?program. It is not intended to diagnose MRSA infection nor to guide ?or monitor treatment for MRSA infections. ?Test performance is not FDA approved in patients less than 2 years ?old. ?Performed at [facility], [place], ?[place] [address] ?  ?  ? ? ? ? ? ?Radiology Studies: ?DG Chest 2  View ? ?Result Date: 04/30/2022 ?CLINICAL DATA:  Shortness of breath EXAM: CHEST - 2 VIEW COMPARISON:  April 10, 2022 FINDINGS: The heart size and mediastinal contours are partially obscured but appear enlarged

## 2022-05-16 DEATH — deceased

## 2022-05-23 ENCOUNTER — Encounter: Payer: Medicare HMO | Admitting: Family Medicine

## 2022-07-23 ENCOUNTER — Ambulatory Visit: Payer: Medicare HMO

## 2022-07-23 ENCOUNTER — Other Ambulatory Visit: Payer: Medicare HMO

## 2022-07-23 ENCOUNTER — Ambulatory Visit: Payer: Medicare HMO | Admitting: Internal Medicine
